# Patient Record
Sex: Female | Born: 1935 | Race: White | Hispanic: No | Marital: Single | Smoking: Never smoker
Health system: Southern US, Community
[De-identification: ages and names within clinical notes are randomized; demographics above are authoritative.]

## PROBLEM LIST (undated history)

## (undated) DIAGNOSIS — M1990 Unspecified osteoarthritis, unspecified site: Secondary | ICD-10-CM

## (undated) DIAGNOSIS — C761 Malignant neoplasm of thorax: Secondary | ICD-10-CM

## (undated) DIAGNOSIS — C44300 Unspecified malignant neoplasm of skin of unspecified part of face: Secondary | ICD-10-CM

## (undated) DIAGNOSIS — R7303 Prediabetes: Secondary | ICD-10-CM

## (undated) DIAGNOSIS — Z923 Personal history of irradiation: Secondary | ICD-10-CM

## (undated) DIAGNOSIS — F329 Major depressive disorder, single episode, unspecified: Secondary | ICD-10-CM

## (undated) DIAGNOSIS — M4800 Spinal stenosis, site unspecified: Secondary | ICD-10-CM

## (undated) DIAGNOSIS — K2100 Gastro-esophageal reflux disease with esophagitis, without bleeding: Secondary | ICD-10-CM

## (undated) DIAGNOSIS — N200 Calculus of kidney: Secondary | ICD-10-CM

## (undated) DIAGNOSIS — F32A Depression, unspecified: Secondary | ICD-10-CM

## (undated) DIAGNOSIS — K219 Gastro-esophageal reflux disease without esophagitis: Secondary | ICD-10-CM

## (undated) DIAGNOSIS — K8020 Calculus of gallbladder without cholecystitis without obstruction: Secondary | ICD-10-CM

## (undated) DIAGNOSIS — I10 Essential (primary) hypertension: Secondary | ICD-10-CM

## (undated) DIAGNOSIS — I2510 Atherosclerotic heart disease of native coronary artery without angina pectoris: Secondary | ICD-10-CM

## (undated) DIAGNOSIS — F419 Anxiety disorder, unspecified: Secondary | ICD-10-CM

## (undated) DIAGNOSIS — I493 Ventricular premature depolarization: Secondary | ICD-10-CM

## (undated) DIAGNOSIS — J449 Chronic obstructive pulmonary disease, unspecified: Secondary | ICD-10-CM

## (undated) DIAGNOSIS — IMO0002 Reserved for concepts with insufficient information to code with codable children: Secondary | ICD-10-CM

## (undated) DIAGNOSIS — C50919 Malignant neoplasm of unspecified site of unspecified female breast: Secondary | ICD-10-CM

## (undated) DIAGNOSIS — I480 Paroxysmal atrial fibrillation: Secondary | ICD-10-CM

## (undated) DIAGNOSIS — H269 Unspecified cataract: Secondary | ICD-10-CM

## (undated) DIAGNOSIS — E785 Hyperlipidemia, unspecified: Secondary | ICD-10-CM

## (undated) DIAGNOSIS — C7989 Secondary malignant neoplasm of other specified sites: Secondary | ICD-10-CM

## (undated) DIAGNOSIS — I340 Nonrheumatic mitral (valve) insufficiency: Secondary | ICD-10-CM

## (undated) HISTORY — PX: APPENDECTOMY: SHX54

## (undated) HISTORY — PX: CARDIAC CATHETERIZATION: SHX172

## (undated) HISTORY — PX: VEIN LIGATION AND STRIPPING: SHX2653

## (undated) HISTORY — DX: Unspecified cataract: H26.9

## (undated) HISTORY — DX: Ventricular premature depolarization: I49.3

## (undated) HISTORY — DX: Chronic obstructive pulmonary disease, unspecified: J44.9

## (undated) HISTORY — DX: Spinal stenosis, site unspecified: M48.00

## (undated) HISTORY — DX: Calculus of gallbladder without cholecystitis without obstruction: K80.20

## (undated) HISTORY — DX: Malignant neoplasm of unspecified site of unspecified female breast: C50.919

## (undated) HISTORY — DX: Prediabetes: R73.03

## (undated) HISTORY — PX: VESICOVAGINAL FISTULA CLOSURE W/ TAH: SUR271

## (undated) HISTORY — DX: Hyperlipidemia, unspecified: E78.5

## (undated) HISTORY — DX: Gastro-esophageal reflux disease with esophagitis: K21.0

## (undated) HISTORY — DX: Reserved for concepts with insufficient information to code with codable children: IMO0002

## (undated) HISTORY — DX: Malignant neoplasm of thorax: C76.1

## (undated) HISTORY — DX: Gastro-esophageal reflux disease with esophagitis, without bleeding: K21.00

## (undated) HISTORY — PX: ABDOMINAL HYSTERECTOMY: SHX81

## (undated) HISTORY — PX: TONSILLECTOMY: SUR1361

## (undated) HISTORY — DX: Paroxysmal atrial fibrillation: I48.0

## (undated) HISTORY — DX: Calculus of kidney: N20.0

## (undated) HISTORY — DX: Gastro-esophageal reflux disease without esophagitis: K21.9

## (undated) HISTORY — DX: Secondary malignant neoplasm of other specified sites: C79.89

## (undated) HISTORY — DX: Unspecified malignant neoplasm of skin of unspecified part of face: C44.300

## (undated) HISTORY — DX: Unspecified osteoarthritis, unspecified site: M19.90

## (undated) HISTORY — DX: Personal history of irradiation: Z92.3

---

## 1959-09-28 HISTORY — PX: TUBAL LIGATION: SHX77

## 1996-09-27 HISTORY — PX: SMALL INTESTINE SURGERY: SHX150

## 1998-04-02 ENCOUNTER — Ambulatory Visit (HOSPITAL_COMMUNITY): Admission: RE | Admit: 1998-04-02 | Discharge: 1998-04-02 | Payer: Self-pay

## 1998-04-18 ENCOUNTER — Ambulatory Visit (HOSPITAL_COMMUNITY): Admission: RE | Admit: 1998-04-18 | Discharge: 1998-04-18 | Payer: Self-pay | Admitting: *Deleted

## 1999-04-24 ENCOUNTER — Ambulatory Visit (HOSPITAL_COMMUNITY): Admission: RE | Admit: 1999-04-24 | Discharge: 1999-04-24 | Payer: Self-pay

## 1999-05-07 ENCOUNTER — Encounter (INDEPENDENT_AMBULATORY_CARE_PROVIDER_SITE_OTHER): Payer: Self-pay | Admitting: Specialist

## 1999-05-07 ENCOUNTER — Ambulatory Visit (HOSPITAL_COMMUNITY): Admission: RE | Admit: 1999-05-07 | Discharge: 1999-05-07 | Payer: Self-pay | Admitting: *Deleted

## 1999-05-22 ENCOUNTER — Ambulatory Visit (HOSPITAL_COMMUNITY): Admission: RE | Admit: 1999-05-22 | Discharge: 1999-05-22 | Payer: Self-pay | Admitting: *Deleted

## 1999-06-16 ENCOUNTER — Encounter: Admission: RE | Admit: 1999-06-16 | Discharge: 1999-09-14 | Payer: Self-pay | Admitting: Radiation Oncology

## 1999-12-15 ENCOUNTER — Encounter: Payer: Self-pay | Admitting: Internal Medicine

## 1999-12-15 ENCOUNTER — Ambulatory Visit (HOSPITAL_COMMUNITY): Admission: RE | Admit: 1999-12-15 | Discharge: 1999-12-15 | Payer: Self-pay | Admitting: Internal Medicine

## 2000-03-11 ENCOUNTER — Encounter: Admission: RE | Admit: 2000-03-11 | Discharge: 2000-03-11 | Payer: Self-pay | Admitting: Internal Medicine

## 2000-03-11 ENCOUNTER — Encounter: Payer: Self-pay | Admitting: Internal Medicine

## 2000-03-22 ENCOUNTER — Encounter: Admission: RE | Admit: 2000-03-22 | Discharge: 2000-06-20 | Payer: Self-pay | Admitting: Internal Medicine

## 2000-06-09 ENCOUNTER — Encounter: Admission: RE | Admit: 2000-06-09 | Discharge: 2000-06-09 | Payer: Self-pay | Admitting: Allergy and Immunology

## 2000-06-09 ENCOUNTER — Encounter: Payer: Self-pay | Admitting: Allergy and Immunology

## 2000-07-13 ENCOUNTER — Encounter: Admission: RE | Admit: 2000-07-13 | Discharge: 2000-07-13 | Payer: Self-pay | Admitting: Internal Medicine

## 2000-07-13 ENCOUNTER — Encounter: Payer: Self-pay | Admitting: Internal Medicine

## 2000-09-27 HISTORY — PX: BREAST LUMPECTOMY: SHX2

## 2001-03-16 ENCOUNTER — Encounter: Payer: Self-pay | Admitting: Gastroenterology

## 2001-03-16 ENCOUNTER — Encounter: Admission: RE | Admit: 2001-03-16 | Discharge: 2001-03-16 | Payer: Self-pay | Admitting: Gastroenterology

## 2001-07-24 ENCOUNTER — Encounter: Payer: Self-pay | Admitting: Internal Medicine

## 2001-07-24 ENCOUNTER — Encounter: Admission: RE | Admit: 2001-07-24 | Discharge: 2001-07-24 | Payer: Self-pay | Admitting: Internal Medicine

## 2002-04-03 ENCOUNTER — Encounter: Admission: RE | Admit: 2002-04-03 | Discharge: 2002-04-03 | Payer: Self-pay | Admitting: Internal Medicine

## 2002-04-03 ENCOUNTER — Encounter: Payer: Self-pay | Admitting: Internal Medicine

## 2002-05-07 ENCOUNTER — Encounter: Payer: Self-pay | Admitting: Internal Medicine

## 2002-05-07 ENCOUNTER — Encounter: Admission: RE | Admit: 2002-05-07 | Discharge: 2002-05-07 | Payer: Self-pay | Admitting: Internal Medicine

## 2002-05-15 ENCOUNTER — Encounter: Admission: RE | Admit: 2002-05-15 | Discharge: 2002-08-13 | Payer: Self-pay | Admitting: Internal Medicine

## 2002-10-17 ENCOUNTER — Encounter: Payer: Self-pay | Admitting: Oncology

## 2002-10-17 ENCOUNTER — Encounter: Admission: RE | Admit: 2002-10-17 | Discharge: 2002-10-17 | Payer: Self-pay | Admitting: Oncology

## 2003-01-19 ENCOUNTER — Emergency Department (HOSPITAL_COMMUNITY): Admission: EM | Admit: 2003-01-19 | Discharge: 2003-01-19 | Payer: Self-pay | Admitting: *Deleted

## 2003-01-19 ENCOUNTER — Encounter: Payer: Self-pay | Admitting: Emergency Medicine

## 2003-09-10 ENCOUNTER — Ambulatory Visit (HOSPITAL_COMMUNITY): Admission: RE | Admit: 2003-09-10 | Discharge: 2003-09-10 | Payer: Self-pay | Admitting: Gastroenterology

## 2003-10-23 ENCOUNTER — Encounter: Admission: RE | Admit: 2003-10-23 | Discharge: 2003-10-23 | Payer: Self-pay | Admitting: Oncology

## 2004-10-27 ENCOUNTER — Ambulatory Visit: Payer: Self-pay | Admitting: Oncology

## 2004-12-24 ENCOUNTER — Encounter: Admission: RE | Admit: 2004-12-24 | Discharge: 2004-12-24 | Payer: Self-pay | Admitting: Oncology

## 2004-12-29 ENCOUNTER — Ambulatory Visit: Payer: Self-pay | Admitting: Oncology

## 2006-09-14 ENCOUNTER — Emergency Department (HOSPITAL_COMMUNITY): Admission: EM | Admit: 2006-09-14 | Discharge: 2006-09-14 | Payer: Self-pay | Admitting: Emergency Medicine

## 2006-11-09 ENCOUNTER — Encounter: Admission: RE | Admit: 2006-11-09 | Discharge: 2006-11-09 | Payer: Self-pay | Admitting: *Deleted

## 2007-11-16 ENCOUNTER — Ambulatory Visit (HOSPITAL_COMMUNITY): Admission: RE | Admit: 2007-11-16 | Discharge: 2007-11-16 | Payer: Self-pay | Admitting: *Deleted

## 2008-01-04 ENCOUNTER — Encounter: Admission: RE | Admit: 2008-01-04 | Discharge: 2008-01-04 | Payer: Self-pay | Admitting: *Deleted

## 2008-02-19 ENCOUNTER — Emergency Department (HOSPITAL_COMMUNITY): Admission: EM | Admit: 2008-02-19 | Discharge: 2008-02-20 | Payer: Self-pay | Admitting: Emergency Medicine

## 2008-08-15 ENCOUNTER — Observation Stay (HOSPITAL_COMMUNITY): Admission: EM | Admit: 2008-08-15 | Discharge: 2008-08-15 | Payer: Self-pay | Admitting: Emergency Medicine

## 2008-11-25 ENCOUNTER — Ambulatory Visit (HOSPITAL_COMMUNITY): Admission: RE | Admit: 2008-11-25 | Discharge: 2008-11-25 | Payer: Self-pay | Admitting: Internal Medicine

## 2009-01-06 ENCOUNTER — Ambulatory Visit (HOSPITAL_COMMUNITY): Admission: RE | Admit: 2009-01-06 | Discharge: 2009-01-06 | Payer: Self-pay | Admitting: Internal Medicine

## 2009-03-26 ENCOUNTER — Ambulatory Visit (HOSPITAL_COMMUNITY): Admission: RE | Admit: 2009-03-26 | Discharge: 2009-03-26 | Payer: Self-pay | Admitting: Internal Medicine

## 2009-07-25 ENCOUNTER — Ambulatory Visit (HOSPITAL_COMMUNITY): Admission: RE | Admit: 2009-07-25 | Discharge: 2009-07-25 | Payer: Self-pay | Admitting: Internal Medicine

## 2010-03-10 ENCOUNTER — Ambulatory Visit (HOSPITAL_COMMUNITY): Admission: RE | Admit: 2010-03-10 | Discharge: 2010-03-10 | Payer: Self-pay | Admitting: Internal Medicine

## 2010-10-18 ENCOUNTER — Encounter: Payer: Self-pay | Admitting: Internal Medicine

## 2011-01-30 ENCOUNTER — Emergency Department (INDEPENDENT_AMBULATORY_CARE_PROVIDER_SITE_OTHER): Payer: Medicare Other

## 2011-01-30 ENCOUNTER — Emergency Department (HOSPITAL_BASED_OUTPATIENT_CLINIC_OR_DEPARTMENT_OTHER)
Admission: EM | Admit: 2011-01-30 | Discharge: 2011-01-31 | Disposition: A | Discharge status: Home / Self Care | Payer: Medicare Other | Attending: Emergency Medicine | Admitting: Emergency Medicine

## 2011-01-30 DIAGNOSIS — R0602 Shortness of breath: Secondary | ICD-10-CM

## 2011-01-30 DIAGNOSIS — R911 Solitary pulmonary nodule: Secondary | ICD-10-CM

## 2011-01-30 DIAGNOSIS — J45909 Unspecified asthma, uncomplicated: Secondary | ICD-10-CM | POA: Insufficient documentation

## 2011-01-30 DIAGNOSIS — G8929 Other chronic pain: Secondary | ICD-10-CM | POA: Insufficient documentation

## 2011-01-30 DIAGNOSIS — Z79899 Other long term (current) drug therapy: Secondary | ICD-10-CM | POA: Insufficient documentation

## 2011-01-30 DIAGNOSIS — J40 Bronchitis, not specified as acute or chronic: Secondary | ICD-10-CM | POA: Insufficient documentation

## 2011-01-30 DIAGNOSIS — R079 Chest pain, unspecified: Secondary | ICD-10-CM

## 2011-01-30 LAB — TROPONIN I: Troponin I: <0.3 ng/mL (ref <0.30)

## 2011-01-30 LAB — DIFFERENTIAL
Basophils Absolute: 0 10*3/uL (ref 0.0–0.1)
Basophils Relative: 0 % (ref 0–1)
Eosinophils Relative: 0 % (ref 0–5)
Monocytes Relative: 10 % (ref 3–12)

## 2011-01-30 LAB — BASIC METABOLIC PANEL
BUN: 13 mg/dL (ref 6–23)
CO2: 27 mEq/L (ref 19–32)
Creatinine, Ser: 0.7 mg/dL (ref 0.4–1.2)
GFR calc Af Amer: >60 mL/min (ref >60)
Potassium: 4.8 mEq/L (ref 3.5–5.1)

## 2011-01-30 LAB — CBC
MCH: 30 pg (ref 26.0–34.0)
RDW: 13.3 % (ref 11.5–15.5)
WBC: 8 10*3/uL (ref 4.0–10.5)

## 2011-01-30 LAB — D-DIMER, QUANTITATIVE: D-Dimer, Quant: <0.22 ug/mL-FEU (ref 0.00–0.48)

## 2011-01-30 LAB — CK TOTAL AND CKMB (NOT AT ARMC)
CK, MB: 2.3 ng/mL (ref 0.3–4.0)
Relative Index: INVALID (ref 0.0–2.5)
Total CK: 48 U/L (ref 7–177)

## 2011-01-31 MED ORDER — IOHEXOL 300 MG/ML  SOLN: 80.0000 mL | Freq: Once | INTRAMUSCULAR | Status: AC | PRN

## 2011-01-31 MED ORDER — IOHEXOL 300 MG/ML  SOLN
80.0000 mL | Freq: Once | INTRAMUSCULAR | Status: AC | PRN
  Administered 2011-01-30 – 2011-01-31 (×2): 80 mL via INTRAVENOUS

## 2011-02-08 ENCOUNTER — Emergency Department (HOSPITAL_BASED_OUTPATIENT_CLINIC_OR_DEPARTMENT_OTHER)
Admission: EM | Admit: 2011-02-08 | Discharge: 2011-02-08 | Disposition: A | Discharge status: Home / Self Care | Payer: Medicare Other | Attending: Emergency Medicine | Admitting: Emergency Medicine

## 2011-02-08 ENCOUNTER — Emergency Department (INDEPENDENT_AMBULATORY_CARE_PROVIDER_SITE_OTHER): Payer: Medicare Other

## 2011-02-08 DIAGNOSIS — K219 Gastro-esophageal reflux disease without esophagitis: Secondary | ICD-10-CM | POA: Insufficient documentation

## 2011-02-08 DIAGNOSIS — R51 Headache: Secondary | ICD-10-CM

## 2011-02-08 DIAGNOSIS — J4489 Other specified chronic obstructive pulmonary disease: Secondary | ICD-10-CM | POA: Insufficient documentation

## 2011-02-08 DIAGNOSIS — M549 Dorsalgia, unspecified: Secondary | ICD-10-CM

## 2011-02-08 DIAGNOSIS — M545 Low back pain, unspecified: Secondary | ICD-10-CM | POA: Insufficient documentation

## 2011-02-08 DIAGNOSIS — J449 Chronic obstructive pulmonary disease, unspecified: Secondary | ICD-10-CM | POA: Insufficient documentation

## 2011-02-08 DIAGNOSIS — J984 Other disorders of lung: Secondary | ICD-10-CM

## 2011-02-08 DIAGNOSIS — G8929 Other chronic pain: Secondary | ICD-10-CM | POA: Insufficient documentation

## 2011-02-08 DIAGNOSIS — Z79899 Other long term (current) drug therapy: Secondary | ICD-10-CM | POA: Insufficient documentation

## 2011-02-08 LAB — URINALYSIS, ROUTINE W REFLEX MICROSCOPIC
Glucose, UA: NEGATIVE mg/dL
Hgb urine dipstick: NEGATIVE
Ketones, ur: NEGATIVE mg/dL
Specific Gravity, Urine: 1.004 — ABNORMAL LOW (ref 1.005–1.030)
pH: 6 (ref 5.0–8.0)

## 2011-02-09 NOTE — Discharge Summary (Signed)
NAMECLAYTON, JARMON NO.:  [id number]   MEDICAL RECORD NO.:  [id number]          PATIENT TYPE:  INP   LOCATION:  5125                         FACILITY:  [facility]   PHYSICIAN:  Heloise Purpura, MD      DATE OF BIRTH:  Aug 15, 1932   DATE OF ADMISSION:  08/14/2008  DATE OF DISCHARGE:  08/15/2008                               DISCHARGE SUMMARY   ADMISSION DIAGNOSIS:  Right ureteral calculus.   DISCHARGE DIAGNOSIS:  Right ureteral calculus.   HISTORY AND PHYSICAL:  For full details please see admission history and  physical.  Briefly, Ms. Vorhees is a 77 year old female who was  admitted after presenting to the emergency department with severe right-  sided flank pain.  She was found to have an 8 mm distal right ureteral  calculus.  She was admitted for IV pain control and IV fluid hydration.   HOSPITAL COURSE:  She was initially placed on tamsulosin with her pain  controlled on IV pain medication.  Her urine was strained and she  subsequently passed her calculus on August 15, 2008.  Her pain was  much improved and she was able to be discharged home in good condition.   DISPOSITION:  Home.   DISCHARGE MEDICATIONS:  She was instructed to resume her regular home  medications.   FOLLOWUP:  She will follow up with me in the next 6-8 weeks for further  evaluation.  She does have bilateral renal calculi and will need to  undergo a metabolic evaluation for further stone prevention.      Heloise Purpura, MD  Electronically Signed     LB/MEDQ  D:  08/15/2008  T:  08/15/2008  Job:  [id number]

## 2011-02-11 ENCOUNTER — Ambulatory Visit (INDEPENDENT_AMBULATORY_CARE_PROVIDER_SITE_OTHER): Payer: Medicare Other | Admitting: Critical Care Medicine

## 2011-02-11 ENCOUNTER — Encounter: Payer: Self-pay | Admitting: Critical Care Medicine

## 2011-02-11 DIAGNOSIS — J45909 Unspecified asthma, uncomplicated: Secondary | ICD-10-CM

## 2011-02-11 DIAGNOSIS — M810 Age-related osteoporosis without current pathological fracture: Secondary | ICD-10-CM

## 2011-02-11 DIAGNOSIS — E785 Hyperlipidemia, unspecified: Secondary | ICD-10-CM

## 2011-02-11 DIAGNOSIS — G8929 Other chronic pain: Secondary | ICD-10-CM

## 2011-02-11 DIAGNOSIS — C50919 Malignant neoplasm of unspecified site of unspecified female breast: Secondary | ICD-10-CM | POA: Insufficient documentation

## 2011-02-11 DIAGNOSIS — M1990 Unspecified osteoarthritis, unspecified site: Secondary | ICD-10-CM | POA: Insufficient documentation

## 2011-02-11 MED ORDER — BECLOMETHASONE DIPROPIONATE 80 MCG/ACT IN AERS: 2.0000 | INHALATION_SPRAY | Freq: Two times a day (BID) | RESPIRATORY_TRACT | Status: DC

## 2011-02-11 NOTE — Progress Notes (Signed)
Subjective:     Patient ID: Angel [name], female   DOB: [birth year], 77 y.o.   MRN: [id number]  Shortness of Breath This is a new (ED visit 01/31/11  dx copd and dx allergy.  rx BD nebs.  Returned again 5/12) problem. The current episode started more than 1 month ago. The problem occurs daily (exertional only.  Not at rest Not able to do much ADLs). The problem has been unchanged. Duration: 2-3 hours to recover, Associated symptoms include chest pain, ear pain, headaches, leg swelling, orthopnea, PND, rhinorrhea, a sore throat and wheezing. Pertinent negatives include no abdominal pain, fever, neck pain, rash, sputum production, syncope or vomiting. The symptoms are aggravated by any activity, fumes, odors, smoke, weather changes, URIs and emotional upset. Associated symptoms comments: Severe fatigue.  Notes diaphoresis.  Notes dizziness Chest pain is tightness.  Hard time taking a deep breath.  Pain across back on shoulders. Tranxene is only relief No real mucus no real cough . She has tried beta agonist inhalers for the symptoms. The treatment provided no relief. Her past medical history is significant for allergies and asthma. There is no history of chronic lung disease or COPD. (Sees Eric Kozlow allergies: dust, mold)  Cough This is a chronic problem. The current episode started more than 1 month ago. Episode frequency: rare  The cough is non-productive. Associated symptoms include chest pain, chills, ear pain, headaches, myalgias, nasal congestion, postnasal drip, rhinorrhea, a sore throat, shortness of breath and wheezing. Pertinent negatives include no fever or rash. Her past medical history is significant for asthma. There is no history of COPD. Sees Eric Kozlow allergies: dust, mold   77 y.o.WF DX asthma entire life.  Current episode started two months ago.  Dyspnea,  Chest pain,  Headaches and shoulder ache, fatigue. Confusion.   Has been to ED several times Rx zpak, pred, solumedrol   Usually helps . Symptoms as above. Review of Systems  Constitutional: Positive for chills, diaphoresis, activity change, appetite change and fatigue. Negative for fever and unexpected weight change.  HENT: Positive for ear pain, congestion, sore throat, rhinorrhea, sneezing, dental problem, voice change, postnasal drip and sinus pressure. Negative for hearing loss, nosebleeds, facial swelling, mouth sores, trouble swallowing, neck pain, neck stiffness, tinnitus and ear discharge.   Eyes: Positive for visual disturbance. Negative for photophobia, discharge and itching.  Respiratory: Positive for cough, chest tightness, shortness of breath and wheezing. Negative for apnea, sputum production, choking and stridor.   Cardiovascular: Positive for chest pain, palpitations, orthopnea, leg swelling and PND. Negative for syncope.  Gastrointestinal: Positive for nausea. Negative for vomiting, abdominal pain, constipation, blood in stool and abdominal distention.  Genitourinary: Positive for urgency and frequency. Negative for dysuria, hematuria, flank pain, decreased urine volume and difficulty urinating.  Musculoskeletal: Positive for myalgias, back pain and gait problem. Negative for joint swelling and arthralgias.  Skin: Negative for color change, pallor and rash.  Neurological: Positive for dizziness, tremors, weakness, light-headedness and headaches. Negative for seizures, syncope, speech difficulty and numbness.  Hematological: Negative for adenopathy. Does not bruise/bleed easily.  Psychiatric/Behavioral: Positive for confusion, sleep disturbance and agitation. The patient is nervous/anxious.        Objective:   Physical Exam Filed Vitals:   02/11/11 1120  BP: 120/70  Pulse: 63  Temp: 98.1 F (36.7 C)  TempSrc: Oral  Height: 5' 1.5" (1.562 m)  Weight: 155 lb 8 oz (70.534 kg)  SpO2: 99%    Gen: Pleasant, well-nourished, in no  distress,  normal affect  ENT: No lesions,  mouth clear,   oropharynx clear, no postnasal drip  Neck: No JVD, no TMG, no carotid bruits  Lungs: No use of accessory muscles, no dullness to percussion, distant BS  Cardiovascular: RRR, heart sounds normal, no murmur or gallops, no peripheral edema  Abdomen: soft and NT, no HSM,  BS normal  Musculoskeletal: No deformities, no cyanosis or clubbing  Neuro: alert, non focal  Skin: Warm, no lesions or rashes  CT Chest: 01/31/11 Mild peripheral scar in RUL No LAN     Assessment:     Asthma Moderate persistent asthma with upper airway irritation from advair Plan Stop Advair Start Qvar two puff twice daily Stop vibramycin No more steroids by mouth No more antibiotics You have ASTHMA,  NO COPD A visiting RN and PT will be ordered from [facility] diet Stay on Nexium daily Return 1 month at [facility], needs spirometry first     Updated Medication List Outpatient Encounter Prescriptions as of 02/11/2011  Medication Sig Dispense Refill  . Calcium-Magnesium-Zinc 1000-400-15 MG TABS Take 1 tablet by mouth daily.        . citalopram (CELEXA) 40 MG tablet 1.5 tablets once daily       . clorazepate (TRANXENE) 7.5 MG tablet Take 7.5 mg by mouth 2 (two) times daily.        [name] esomeprazole (NEXIUM) 40 MG capsule Take 40 mg by mouth daily before breakfast.        . EVISTA 60 MG tablet Once daily      . HYDROcodone-acetaminophen (NORCO) 10-325 MG per tablet 1 every 4-6 hours as needed      . Multiple Vitamin (MULTIVITAMIN) tablet Take 1 tablet by mouth daily.        [name] NASONEX 50 MCG/ACT nasal spray 2 sprays by Nasal route Daily.      [name] oxyCODONE-acetaminophen (PERCOCET) 5-325 MG per tablet 1-2 every 4-6 hours as needed      . PROAIR HFA 108 (90 BASE) MCG/ACT inhaler 1 puff every 4-6 hours as needed      . SINGULAIR 10 MG tablet Once tablet at bedtime      . ZETIA 10 MG tablet Once at bedtime      . DISCONTD: CELEBREX 200 MG capsule Once daily      . DISCONTD: doxycycline (VIBRA-TABS) 100 MG  tablet Two times daily      . DISCONTD: Fluticasone-Salmeterol (ADVAIR DISKUS) 250-50 MCG/DOSE AEPB Inhale 1 puff into the lungs every 12 (twelve) hours.        [name] DISCONTD: guaifenesin (HUMIBID E) 400 MG TABS Take 400 mg by mouth 2 (two) times daily.        . beclomethasone (QVAR) 80 MCG/ACT inhaler Inhale 2 puffs into the lungs 2 (two) times daily.  1 Inhaler  6         Plan:

## 2011-02-11 NOTE — Patient Instructions (Signed)
Stop Advair Start Qvar two puff twice daily Stop vibramycin No more steroids by mouth No more antibiotics You have ASTHMA,  NO COPD A visiting RN and PT will be ordered from [facility] diet Stay on Nexium daily Return 1 month at [facility], needs spirometry first

## 2011-02-11 NOTE — Progress Notes (Deleted)
  Subjective:    Patient ID: Angel [name], female    DOB: 12-28-1931, 77 y.o.   MRN: [id number]  HPI    Review of Systems     Objective:   Physical Exam        Assessment & Plan:

## 2011-02-12 NOTE — H&P (Signed)
Angel [name], Angel [name] NO.:  [id number]   MEDICAL RECORD NO.:  [id number]          PATIENT TYPE:  INP   LOCATION:  5125                         FACILITY:  [facility]   PHYSICIAN:  Heloise Purpura, MD      DATE OF BIRTH:  [birth year]   DATE OF ADMISSION:  08/14/2008  DATE OF DISCHARGE:                              HISTORY & PHYSICAL   CHIEF COMPLAINT:  Kidney stone.   HISTORY:  Angel [name] is a 77 year old female who began having the  acute onset of sudden right flank pain earlier today.  Her pain radiated  to her right lower quadrant and was associated with nausea and vomiting.  She denies any fever.  She does not have a known history of prior kidney  stone episodes, although has been told that she did have nonobstructing  renal calculi in the past on imaging studies.  She was evaluated in the  emergency department and underwent a CT scan, which demonstrated an 8-mm  right UVJ calculus with associated hydronephrosis.  Her pain was unable  to be controlled with IV pain medications, and therefore a urologic  consultation was obtained.   PAST MEDICAL HISTORY:  1. Asthma with a recent acute bronchitis.  2. Degenerative joint disease.  3. Gastroesophageal reflux disease.  4. Spinal stenosis.  5. Depression.   MEDICATIONS:  1. Prednisone taper.  2. Azithromycin.  3. Fluoxetine.  4. Albuterol.  5. Vitamin D.  6. Nasonex.  7. Citalopram.  8. Nexium.  9. Vicodin p.r.n.  10.Naproxen.   ALLERGIES:  PENICILLIN.   FAMILY HISTORY:  No GU malignancy.   SOCIAL HISTORY:  The patient denies tobacco or alcohol abuse.   REVIEW OF SYSTEMS:  A complete review of systems was performed.  All  systems are reviewed and are negative except for the history of present  illness and her recent history of cough and shortness of breath related  to bronchitis.   PHYSICAL EXAMINATION:  VITALS:  Temperature 98.1, heart rate 85, blood  pressure 184/96, and respirations 20.  CONSTITUTIONAL:  Well-nourished, well-developed, age-appropriate female  in no acute distress.  HEENT:  Normocephalic, atraumatic.  NECK:  Supple without lymphadenopathy.  CARDIOVASCULAR:  Regular rate and rhythm without obvious murmurs.  LUNGS:  Clear bilaterally.  ABDOMEN:  Soft and nondistended.  She does have moderate tenderness over  her right lower quadrant without rebound, tenderness, or guarding.  BACK:  Moderate right CVA tenderness.  EXTREMITIES:  No edema.  NEUROLOGIC:  Grossly intact.   LABORATORY DATA:  Serum creatinine 0.77.  White blood count 11.7.  Urinalysis is dipstick negative.   RADIOLOGIC IMAGING:  The patient's CT scan was independently reviewed  and demonstrates multiple bilateral nonobstructing renal calculi.  There  is an 8-mm right UVJ calculus with hydronephrosis and perinephric fluid  consistent with probable forniceal extravasation of urine.   IMPRESSION:  The right ureterovesical junction calculus.   PLAN:  She will be admitted for IV pain control and IV fluid hydration.  She will be given an initial observation.  To spontaneously pass her  stone and will begin Flomax 0.4 mg daily.  Heloise Purpura, MD  Electronically Signed     LB/MEDQ  D:  08/16/2008  T:  08/16/2008  Job:  [id number]

## 2011-02-13 NOTE — Assessment & Plan Note (Signed)
Moderate persistent asthma with upper airway irritation from advair Plan Stop Advair Start Qvar two puff twice daily Stop vibramycin No more steroids by mouth No more antibiotics You have ASTHMA,  NO COPD A visiting RN and PT will be ordered from [facility] diet Stay on Nexium daily Return 1 month at [facility], needs spirometry first

## 2011-02-24 ENCOUNTER — Encounter: Payer: Self-pay | Admitting: Critical Care Medicine

## 2011-03-16 ENCOUNTER — Ambulatory Visit (INDEPENDENT_AMBULATORY_CARE_PROVIDER_SITE_OTHER): Payer: Medicare Other | Admitting: Critical Care Medicine

## 2011-03-16 ENCOUNTER — Other Ambulatory Visit: Payer: Medicare Other

## 2011-03-16 ENCOUNTER — Encounter: Payer: Self-pay | Admitting: Critical Care Medicine

## 2011-03-16 DIAGNOSIS — J45909 Unspecified asthma, uncomplicated: Secondary | ICD-10-CM

## 2011-03-16 LAB — PULMONARY FUNCTION TEST

## 2011-03-16 NOTE — Progress Notes (Signed)
Subjective:     Patient ID: Angel [name], female   DOB: [birth year], 77 y.o.   MRN: [id number]  Shortness of Breath This is a new (ED visit 01/31/11  dx copd and dx allergy.  rx BD nebs.  Returned again 5/12) problem. The current episode started more than 1 month ago. Episode frequency: exertional only.  Not at rest Not able to do much ADLs. The problem has been gradually improving. Duration: 2-3 hours to recover, Pertinent negatives include no abdominal pain, chest pain, ear pain, fever, headaches, leg swelling, neck pain, orthopnea, PND, rash, rhinorrhea, sore throat, sputum production, syncope, vomiting or wheezing. The symptoms are aggravated by any activity, fumes, odors, smoke, weather changes, URIs and emotional upset. Associated symptoms comments: Severe fatigue.  Notes diaphoresis.  Notes dizziness Chest pain is tightness.  Hard time taking a deep breath.  Pain across back on shoulders. Tranxene is only relief No real mucus no real cough . She has tried beta agonist inhalers for the symptoms. The treatment provided no relief. Her past medical history is significant for allergies and asthma. There is no history of chronic lung disease or COPD. (Sees Eric Kozlow allergies: dust, mold)  Cough This is a chronic problem. The current episode started more than 1 month ago. The problem has been rapidly improving. Episode frequency: rare  The cough is non-productive. Pertinent negatives include no chest pain, chills, ear pain, fever, headaches, myalgias, nasal congestion, postnasal drip, rash, rhinorrhea, sore throat, shortness of breath or wheezing. Her past medical history is significant for asthma. There is no history of COPD. Sees Eric Kozlow allergies: dust, mold   78 y.o.WF DX asthma entire life.  Current episode started two months ago.  Dyspnea,  Chest pain,  Headaches and shoulder ache, fatigue. Confusion.   Has been to ED several times Rx zpak, pred, solumedrol  Usually helps . Symptoms as  above.  03/20/2011 At last OV. 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  Will ave 2 episodes through the winter.  Last one 4/12 for 6 weeks Ave twice per year.  Worse in the winter time  Gets allergy shots with Kozlow.    More recently dyspnea is ok except with activity will get dyspneic Review of Systems  Constitutional: Positive for diaphoresis, activity change, appetite change and fatigue. Negative for fever, chills and unexpected weight change.  HENT: Positive for congestion, sneezing, dental problem, voice change and sinus pressure. Negative for hearing loss, ear pain, nosebleeds, sore throat, facial swelling, rhinorrhea, mouth sores, trouble swallowing, neck pain, neck stiffness, postnasal drip, tinnitus and ear discharge.   Eyes: Positive for visual disturbance. Negative for photophobia, discharge and itching.  Respiratory: Positive for cough and chest tightness. Negative for apnea, sputum production, choking, shortness of breath, wheezing and stridor.   Cardiovascular: Positive for palpitations. Negative for chest pain, orthopnea, leg swelling, syncope and PND.  Gastrointestinal: Positive for nausea. Negative for vomiting, abdominal pain, constipation, blood in stool and abdominal distention.  Genitourinary: Positive for urgency and frequency. Negative for dysuria, hematuria, flank pain, decreased urine volume and difficulty urinating.  Musculoskeletal: Positive for back pain and gait problem. Negative for myalgias, joint swelling and arthralgias.  Skin: Negative for color change, pallor and rash.  Neurological: Positive for dizziness, tremors, weakness and light-headedness. Negative for seizures, syncope, speech difficulty,  numbness and headaches.  Hematological: Negative for adenopathy. Does not bruise/bleed easily.  Psychiatric/Behavioral: Positive for confusion, sleep disturbance and agitation. The patient is nervous/anxious.        Objective:   Physical Exam  Filed Vitals:   03/16/11 1153  BP: 110/66  Pulse: 44  Temp: 97.9 F (36.6 C)  TempSrc: Oral  Height: 5\' 2"  (1.575 m)  Weight: 154 lb (69.854 kg)  SpO2: 97%    Gen: Pleasant, well-nourished, in no distress,  normal affect  ENT: No lesions,  mouth clear,  oropharynx clear, no postnasal drip  Neck: No JVD, no TMG, no carotid bruits  Lungs: No use of accessory muscles, no dullness to percussion, clear  Cardiovascular: RRR, heart sounds normal, no murmur or gallops, no peripheral edema  Abdomen: soft and NT, no HSM,  BS normal  Musculoskeletal: No deformities, no cyanosis or clubbing  Neuro: alert, non focal  Skin: Warm, no lesions or rashes  CT Chest: 01/31/11 Mild peripheral scar in RUL No LAN 6/19: IgE <1.5    Assessment:     Asthma Moderate persistent asthma with Low IgE levels on todays OV so xolair not an option, pt on immunotherapy per Dr Lucie Leather for years Plan No change in inhaled or maintenance medications. Return in  3 months      Updated Medication List Outpatient Encounter Prescriptions as of 03/16/2011  Medication Sig Dispense Refill  . beclomethasone (QVAR) 80 MCG/ACT inhaler Inhale 2 puffs into the lungs 2 (two) times daily.  1 Inhaler  6  . Calcium-Magnesium-Zinc 1000-400-15 MG TABS Take 1 tablet by mouth daily.        . CELEBREX 200 MG capsule Take 1 capsule by mouth Daily.      . Cholecalciferol (VITAMIN D-3) 5000 UNITS TABS Take by mouth daily.        . citalopram (CELEXA) 40 MG tablet 1.5 tablets once daily       . clorazepate (TRANXENE) 7.5 MG tablet Take 7.5 mg by mouth 2 (two) times daily as needed.       . Coenzyme Q10 (CO Q-10 PO) Take by mouth daily.        [name] esomeprazole (NEXIUM) 40 MG capsule  Take 40 mg by mouth daily before breakfast.        . EVISTA 60 MG tablet Once daily      . Flaxseed, Linseed, (FLAX SEED OIL PO) Take by mouth. 1 tsp daily       . HYDROcodone-acetaminophen (NORCO) 10-325 MG per tablet 1 every 6 hours as needed      . Multiple Vitamin (MULTIVITAMIN) tablet Take 1 tablet by mouth daily.        [name] NASONEX 50 MCG/ACT nasal spray 2 sprays by Nasal route Daily.      [name] PROAIR HFA 108 (90 BASE) MCG/ACT inhaler 1 puff every 4-6 hours as needed      . SINGULAIR 10 MG tablet Once tablet at bedtime      . ZETIA 10 MG tablet Once at bedtime      . DISCONTD: oxyCODONE-acetaminophen (PERCOCET) 5-325 MG per tablet 1-2 every 4-6 hours as needed             Plan:

## 2011-03-16 NOTE — Patient Instructions (Signed)
No change in inhaled medications IgE level today in lab to assess for possible Xolair therapy Return 3 months, call sooner if you worsen

## 2011-03-16 NOTE — Progress Notes (Signed)
PFT done today. 

## 2011-03-17 LAB — IGE: IgE (Immunoglobulin E), Serum: <1.5 IU/mL (ref 0.0–180.0)

## 2011-03-18 NOTE — Progress Notes (Signed)
Quick Note:  Called pt's cell number - she was informed IgE levels are normal. No evidence of severe allergies so Xolair is not an option now for her asthma. She verbalized understanding of this. ______

## 2011-03-19 ENCOUNTER — Telehealth: Payer: Self-pay | Admitting: Critical Care Medicine

## 2011-03-19 NOTE — Telephone Encounter (Signed)
Called and spoke with pt and she stated that she did not fully understand when crystal called her with the results of the lab yesterday.  i reviewed the lab with her again. She is aware that per PW she no longer needs the xolair injections.  Pt is very happy to hear that.  She will call for any other problems

## 2011-03-20 NOTE — Assessment & Plan Note (Addendum)
Moderate persistent asthma with Low IgE levels on todays OV so xolair not an option, pt on immunotherapy per Dr Lucie Leather for years Plan No change in inhaled or maintenance medications. Return in  3 months

## 2011-03-22 ENCOUNTER — Encounter: Payer: Self-pay | Admitting: Critical Care Medicine

## 2011-03-25 NOTE — Progress Notes (Signed)
Normal PFTs Pt aware  

## 2011-03-28 ENCOUNTER — Emergency Department (INDEPENDENT_AMBULATORY_CARE_PROVIDER_SITE_OTHER): Payer: Medicare Other

## 2011-03-28 ENCOUNTER — Emergency Department (HOSPITAL_BASED_OUTPATIENT_CLINIC_OR_DEPARTMENT_OTHER)
Admission: EM | Admit: 2011-03-28 | Discharge: 2011-03-28 | Disposition: A | Discharge status: Home / Self Care | Payer: Medicare Other | Attending: Emergency Medicine | Admitting: Emergency Medicine

## 2011-03-28 DIAGNOSIS — K219 Gastro-esophageal reflux disease without esophagitis: Secondary | ICD-10-CM | POA: Insufficient documentation

## 2011-03-28 DIAGNOSIS — X500XXA Overexertion from strenuous movement or load, initial encounter: Secondary | ICD-10-CM

## 2011-03-28 DIAGNOSIS — J4489 Other specified chronic obstructive pulmonary disease: Secondary | ICD-10-CM | POA: Insufficient documentation

## 2011-03-28 DIAGNOSIS — M25559 Pain in unspecified hip: Secondary | ICD-10-CM

## 2011-03-28 DIAGNOSIS — G8929 Other chronic pain: Secondary | ICD-10-CM | POA: Insufficient documentation

## 2011-03-28 DIAGNOSIS — M25569 Pain in unspecified knee: Secondary | ICD-10-CM | POA: Insufficient documentation

## 2011-03-28 DIAGNOSIS — M1710 Unilateral primary osteoarthritis, unspecified knee: Secondary | ICD-10-CM

## 2011-03-28 DIAGNOSIS — Z79899 Other long term (current) drug therapy: Secondary | ICD-10-CM | POA: Insufficient documentation

## 2011-03-28 DIAGNOSIS — J449 Chronic obstructive pulmonary disease, unspecified: Secondary | ICD-10-CM | POA: Insufficient documentation

## 2011-03-28 DIAGNOSIS — M899 Disorder of bone, unspecified: Secondary | ICD-10-CM

## 2011-03-28 DIAGNOSIS — IMO0002 Reserved for concepts with insufficient information to code with codable children: Secondary | ICD-10-CM | POA: Insufficient documentation

## 2011-03-30 ENCOUNTER — Encounter: Payer: Self-pay | Admitting: Critical Care Medicine

## 2011-04-06 ENCOUNTER — Inpatient Hospital Stay (HOSPITAL_BASED_OUTPATIENT_CLINIC_OR_DEPARTMENT_OTHER)
Admission: RE | Admit: 2011-04-06 | Discharge: 2011-04-06 | Disposition: A | Discharge status: Home / Self Care | Payer: Medicare Other | Source: Ambulatory Visit | Attending: Cardiology | Admitting: Cardiology

## 2011-04-06 DIAGNOSIS — I2510 Atherosclerotic heart disease of native coronary artery without angina pectoris: Secondary | ICD-10-CM | POA: Insufficient documentation

## 2011-04-06 DIAGNOSIS — R0602 Shortness of breath: Secondary | ICD-10-CM | POA: Insufficient documentation

## 2011-04-06 DIAGNOSIS — R079 Chest pain, unspecified: Secondary | ICD-10-CM | POA: Insufficient documentation

## 2011-04-06 DIAGNOSIS — R61 Generalized hyperhidrosis: Secondary | ICD-10-CM | POA: Insufficient documentation

## 2011-04-13 NOTE — Cardiovascular Report (Signed)
NAME:  Angel [name], POINTER NO.:  [id number]  MEDICAL RECORD NO.:  [id number]  LOCATION:                                 FACILITY:  PHYSICIAN:  Armanda Magic, M.D.     DATE OF BIRTH:  [birth year]  DATE OF PROCEDURE: DATE OF DISCHARGE:                           CARDIAC CATHETERIZATION   PROCEDURES: 1. Left heart catheterization. 2. Coronary angiography. 3. Left ventriculography.  OPERATOR:  Armanda Magic, MD  INDICATIONS:  Chest pain.  COMPLICATIONS:  None.  IV ACCESS:  Via right femoral artery 4-French sheath.  IV MEDICATIONS: 1. Versed 2 mg. 2. Fentanyl 25 mcg. 3. Morphine 2 mg.  This is a 77 year old female who presented with complaints of chest pain and shortness of breath.  She underwent nuclear stress test, showing no ischemia, normal LV function, normal t.i.d. with normal lung/heart ratio and no ischemia on EKG.  She continues to have episodes of diaphoresis after doing physical exertion and gets heaviness in her chest, shortness of breath, and has to lie down.  She has to take Tranxene to help her relax after these episodes occur.  She now presents for cardiac catheterization.  The patient was brought to the cardiac catheterization laboratory in the fasting nonsedated state.  Informed consent was obtained.  The patient was connected to the continuous heart rate and pulse oximetry monitoring, intermittent blood pressure monitoring.  The right groin was prepped and draped in sterile fashion.  A 1% Xylocaine was used for local anesthesia.  Using modified Seldinger technique, a 4-French sheath was placed in the right femoral artery.  Under fluoroscopic guidance, a 4-French JL-4 catheter was placed in the left coronary artery.  It could not engage the coronary ostium adequately and was exchanged out over a guidewire for a 4-French JL-5 catheter which successfully engaged the coronary ostium.  Multiple cine films were taken at 30-degree RAO, 40- degree  LAO views.  This catheter was then exchanged out over a guidewire for a 4-French 3D RCA catheter which successfully engaged the right coronary ostium.  Multiple cine films were taken in the 30-degree RAO, 40-degree LAO views.  This catheter was then exchanged out over a guidewire for a 4-French angled pigtail catheter which was placed under fluoroscopic guidance in the left ventricular cavity.  The left ventriculography was performed in the 30-degree RAO view using total of 25 mL of contrast at 12 mL per second.  The catheter was then pulled back across the aortic valve with no significant gradient noted.  At the end of the procedure, all catheters and sheaths were removed.  Manual compression was performed until adequate hemostasis was obtained.  The patient was transferred back to room in stable condition.  RESULTS: 1. The left main coronary artery is widely patent and bifurcates in to     left anterior descending artery and left circumflex artery. 2. The left anterior descending artery is widely patent in its     proximal portion.  It then gives rise to a very large diagonal     branch which is almost a secondary LAD.  The diagonal has an     eccentric 20% lesion in the proximal portion, but is widely patent  throughout the rest of the vessel which bifurcates distally into 2     daughter vessels, both of which are widely patent.  At the takeoff     of the first diagonal, there is a 60% narrowing of the LAD.  The     ongoing LAD that was widely patent traverses the apex with no other     obstructive disease.  The left circumflex traverses the AV groove and gives rise to a small-to- moderate sized first obtuse marginal branch which is widely patent.  It then gives rise to a second large obtuse marginal branch which is widely patent and bifurcates into daughter vessels, both of which are widely patent.  The ongoing circumflex traverses the AV groove and is widely patent.  The  right coronary artery is widely patent throughout its course and distally bifurcates into posterior descending artery and posterior lateral artery, both of which are widely patent.  Left ventriculography shows normal LV function, EF 60%, LVEDP is 17 mmHg, aortic pressure 124/62 mmHg, LV pressure 120/10 mmHg.  ASSESSMENT: 1. One-vessel borderline atherosclerotic coronary artery disease of     the left anterior descending coronary artery at the  bifurcation of     the large diagonal branch.  Nuclear stress test did not show any     areas of ischemia in the left anterior descending coronary artery     territory on nuclear stress testing. 2. Normal left ventricular function. 3. Chest pain.  PLAN:  We will discharge home after IV fluid and bedrest and start aspirin 325 mg daily and will review films with Dr. Katrinka Blazing for further evaluation of the need for possible FloWire to determine the significance of LAD lesion.  She will follow up with my nurse practitioner in 1 week for groin check.     Armanda Magic, M.D.     TT/MEDQ  D:  04/06/2011  T:  04/06/2011  Job:  [id number]  cc:   Lucky Cowboy, M.D.  Electronically Signed by Armanda Magic M.D. on 04/13/2011 07:37:40 PM

## 2011-05-06 ENCOUNTER — Other Ambulatory Visit (HOSPITAL_COMMUNITY): Payer: Self-pay | Admitting: Internal Medicine

## 2011-05-06 DIAGNOSIS — Z1231 Encounter for screening mammogram for malignant neoplasm of breast: Secondary | ICD-10-CM

## 2011-06-09 ENCOUNTER — Ambulatory Visit (HOSPITAL_COMMUNITY)
Admission: RE | Admit: 2011-06-09 | Discharge: 2011-06-09 | Disposition: A | Discharge status: Home / Self Care | Payer: Medicare Other | Source: Ambulatory Visit | Attending: Internal Medicine | Admitting: Internal Medicine

## 2011-06-09 ENCOUNTER — Other Ambulatory Visit (HOSPITAL_COMMUNITY): Payer: Self-pay | Admitting: Internal Medicine

## 2011-06-09 DIAGNOSIS — Z0000 Encounter for general adult medical examination without abnormal findings: Secondary | ICD-10-CM | POA: Insufficient documentation

## 2011-06-09 DIAGNOSIS — Z780 Asymptomatic menopausal state: Secondary | ICD-10-CM | POA: Insufficient documentation

## 2011-06-09 DIAGNOSIS — I10 Essential (primary) hypertension: Secondary | ICD-10-CM | POA: Insufficient documentation

## 2011-06-09 DIAGNOSIS — Z13820 Encounter for screening for osteoporosis: Secondary | ICD-10-CM | POA: Insufficient documentation

## 2011-06-09 DIAGNOSIS — Z1231 Encounter for screening mammogram for malignant neoplasm of breast: Secondary | ICD-10-CM | POA: Insufficient documentation

## 2011-06-09 DIAGNOSIS — J45909 Unspecified asthma, uncomplicated: Secondary | ICD-10-CM | POA: Insufficient documentation

## 2011-06-10 ENCOUNTER — Ambulatory Visit (INDEPENDENT_AMBULATORY_CARE_PROVIDER_SITE_OTHER): Payer: Medicare Other | Admitting: Critical Care Medicine

## 2011-06-10 ENCOUNTER — Encounter: Payer: Self-pay | Admitting: Critical Care Medicine

## 2011-06-10 DIAGNOSIS — J45909 Unspecified asthma, uncomplicated: Secondary | ICD-10-CM

## 2011-06-10 MED ORDER — MONTELUKAST SODIUM 10 MG PO TABS: 10.0000 mg | ORAL_TABLET | Freq: Every day | ORAL | Status: DC

## 2011-06-10 NOTE — Assessment & Plan Note (Signed)
Moderate persistent asthma now improved,   Plan Cont qvar 80 two puff twice daily Cont singulair Rov 4 months

## 2011-06-10 NOTE — Patient Instructions (Signed)
Stay on Qvar daily two puffs twice a day Refill singulair sent , generic is available, take it daily No other medication changes, your lung function is better Return 4 months

## 2011-06-10 NOTE — Progress Notes (Signed)
Subjective:     Patient ID: Angel [name], female   DOB: 09/08/1932, 77 y.o.   MRN: [id number]  Shortness of Breath This is a new (ED visit 01/31/11  dx copd and dx allergy.  rx BD nebs.  Returned again 5/12) problem. The current episode started more than 1 month ago. Episode frequency: exertional only.  Not at rest Not able to do much ADLs. The problem has been gradually improving. Duration: 2-3 hours to recover, Pertinent negatives include no abdominal pain, chest pain, ear pain, fever, headaches, leg swelling, neck pain, orthopnea, PND, rash, rhinorrhea, sore throat, sputum production, syncope, vomiting or wheezing. The symptoms are aggravated by any activity, fumes, odors, smoke, weather changes, URIs and emotional upset. Associated symptoms comments: Severe fatigue.  Notes diaphoresis.  Notes dizziness Chest pain is tightness.  Hard time taking a deep breath.  Pain across back on shoulders. Tranxene is only relief No real mucus no real cough . She has tried beta agonist inhalers for the symptoms. The treatment provided no relief. Her past medical history is significant for allergies and asthma. There is no history of chronic lung disease or COPD. (Sees Eric Kozlow allergies: dust, mold)  Cough This is a chronic problem. The current episode started more than 1 month ago. The problem has been rapidly improving. Episode frequency: rare  The cough is non-productive. Associated symptoms include shortness of breath. Pertinent negatives include no chest pain, chills, ear pain, fever, headaches, myalgias, nasal congestion, postnasal drip, rash, rhinorrhea, sore throat or wheezing. Her past medical history is significant for asthma. There is no history of COPD. Sees Eric Kozlow allergies: dust, mold   77 y.o.WF DX asthma entire life.  Current episode started two months ago.  Dyspnea,  Chest pain,  Headaches and shoulder ache, fatigue. Confusion.   Has been to ED several times Rx zpak, pred, solumedrol   Usually helps . Symptoms as above.  6/19 At last OV. 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  Will ave 2 episodes through the winter.  Last one 4/12 for 6 weeks Ave twice per year.  Worse in the winter time  Gets allergy shots with Kozlow.    More recently dyspnea is ok except with activity will get dyspneic  06/10/2011 Had cath 7/12: 50% coronary lesion not enough to explain symptoms.   Notes now, still dyspneic,Better in summer, worse in winter,  Notes fatigue if moves around. Hard time making beds. SOme chest tightness and dyspnea with ADLs  Asthma History:   Symptoms  >2 days/week         Nighttime Awakenings  0-2/month         Asthma interference with normal activity  Minor limitations         SABA use (not for EIB)  > 2 days/wk--not > 1 x/day         Risk: Exacerbations requiring oral systemic steroids  0-1 / year            Review of Systems  Constitutional: Positive for activity change and fatigue. Negative for fever, chills, diaphoresis, appetite change and unexpected weight change.  HENT: Positive for dental problem and voice change. Negative for hearing loss, ear pain, nosebleeds, congestion, sore throat,  facial swelling, rhinorrhea, sneezing, mouth sores, trouble swallowing, neck pain, neck stiffness, postnasal drip, sinus pressure, tinnitus and ear discharge.   Eyes: Positive for visual disturbance. Negative for photophobia, discharge and itching.  Respiratory: Positive for cough, chest tightness and shortness of breath. Negative for apnea, sputum production, choking, wheezing and stridor.   Cardiovascular: Positive for palpitations. Negative for chest pain, orthopnea, leg swelling, syncope and PND.    Gastrointestinal: Negative for nausea, vomiting, abdominal pain, constipation, blood in stool and abdominal distention.  Genitourinary: Positive for urgency and frequency. Negative for dysuria, hematuria, flank pain, decreased urine volume and difficulty urinating.  Musculoskeletal: Positive for back pain and gait problem. Negative for myalgias, joint swelling and arthralgias.  Skin: Negative for color change, pallor and rash.  Neurological: Positive for tremors and light-headedness. Negative for dizziness, seizures, syncope, speech difficulty, weakness, numbness and headaches.  Hematological: Negative for adenopathy. Does not bruise/bleed easily.  Psychiatric/Behavioral: Positive for sleep disturbance. Negative for confusion and agitation. The patient is nervous/anxious.        Objective:   Physical Exam  Filed Vitals:   06/10/11 1457  BP: 126/70  Pulse: 69  Temp: 97.9 F (36.6 C)  TempSrc: Oral  Height: 5\' 1"  (1.549 m)  Weight: 149 lb (67.586 kg)  SpO2: 97%    Gen: Pleasant, well-nourished, in no distress,  normal affect  ENT: No lesions,  mouth clear,  oropharynx clear, no postnasal drip  Neck: No JVD, no TMG, no carotid bruits  Lungs: No use of accessory muscles, no dullness to percussion, distant BS, no wheeze  Cardiovascular: RRR, heart sounds normal, no murmur or gallops, no peripheral edema  Abdomen: soft and NT, no HSM,  BS normal  Musculoskeletal: No deformities, no cyanosis or clubbing  Neuro: alert, non focal  Skin: Warm, no lesions or rashes  CT Chest: 01/31/11 Mild peripheral scar in RUL No LAN 6/19: IgE <1.5 PFT Conversion 03/22/2011  FVC 2.38  FVC PREDICT 2.45  FVC  % Predicted 97  FEV1 1.81  FEV1 PREDICT 1.69  FEV % Predicted 107  FEV1/FVC 76.1  FEV1/FVC PRE 71  FeF 25-75 1.48  FeF 25-75 % Predicted 74  Residual Volume (ml) 1.53  RV % EXPECT 84  DLCO (ml/mmHg sec) 16.6  DLCO % EXPEC 92  DLCO/VA 4.44  DLCO/VA%EXP 130  06/09/11: CXR:    NAD    Assessment:     Asthma Moderate persistent asthma now improved,   Plan Cont qvar 80 two puff twice daily Cont singulair Rov 4 months     Updated Medication List Outpatient Encounter Prescriptions as of 06/10/2011  Medication Sig Dispense Refill  . beclomethasone (QVAR) 80 MCG/ACT inhaler Inhale 2 puffs into the lungs 2 (two) times daily.  1 Inhaler  6  . Calcium-Magnesium-Zinc 1000-400-15 MG TABS Take 1 tablet by mouth daily.        . CELEBREX 200 MG capsule Take 1 capsule by mouth Daily.      . Cholecalciferol (VITAMIN D-3) 5000 UNITS TABS Take by mouth daily.        . citalopram (CELEXA) 40 MG tablet 1.5 tablets once daily       . clorazepate (TRANXENE) 7.5 MG tablet Take 7.5 mg by mouth 3 (three) times daily.       . Coenzyme Q10 (CO Q-10 PO) Take by mouth daily.        [name] esomeprazole (NEXIUM) 40 MG capsule Take 40 mg by mouth daily before breakfast.        .  EVISTA 60 MG tablet Once daily      . Flaxseed, Linseed, (FLAX SEED OIL PO) Take by mouth. 1 tsp daily       . HYDROcodone-acetaminophen (NORCO) 10-325 MG per tablet 1 every 6 hours as needed      . Multiple Vitamin (MULTIVITAMIN) tablet Take 1 tablet by mouth daily.        [name] NASONEX 50 MCG/ACT nasal spray 2 sprays by Nasal route Daily.      [name] PROAIR HFA 108 (90 BASE) MCG/ACT inhaler 1 puff every 4-6 hours as needed      . ZETIA 10 MG tablet Once at bedtime      . DISCONTD: SINGULAIR 10 MG tablet Once tablet at bedtime      . montelukast (SINGULAIR) 10 MG tablet Take 1 tablet (10 mg total) by mouth at bedtime.  90 tablet  4       Updated Medication List Outpatient Encounter Prescriptions as of 06/10/2011  Medication Sig Dispense Refill  . beclomethasone (QVAR) 80 MCG/ACT inhaler Inhale 2 puffs into the lungs 2 (two) times daily.  1 Inhaler  6  . Calcium-Magnesium-Zinc 1000-400-15 MG TABS Take 1 tablet by mouth daily.        . CELEBREX 200 MG capsule Take 1 capsule by mouth Daily.      . Cholecalciferol  (VITAMIN D-3) 5000 UNITS TABS Take by mouth daily.        . citalopram (CELEXA) 40 MG tablet 1.5 tablets once daily       . clorazepate (TRANXENE) 7.5 MG tablet Take 7.5 mg by mouth 3 (three) times daily.       . Coenzyme Q10 (CO Q-10 PO) Take by mouth daily.        [name] esomeprazole (NEXIUM) 40 MG capsule Take 40 mg by mouth daily before breakfast.        . EVISTA 60 MG tablet Once daily      . Flaxseed, Linseed, (FLAX SEED OIL PO) Take by mouth. 1 tsp daily       . HYDROcodone-acetaminophen (NORCO) 10-325 MG per tablet 1 every 6 hours as needed      . Multiple Vitamin (MULTIVITAMIN) tablet Take 1 tablet by mouth daily.        [name] NASONEX 50 MCG/ACT nasal spray 2 sprays by Nasal route Daily.      [name] PROAIR HFA 108 (90 BASE) MCG/ACT inhaler 1 puff every 4-6 hours as needed      . SINGULAIR 10 MG tablet Once tablet at bedtime      . ZETIA 10 MG tablet Once at bedtime             Plan:

## 2011-06-14 ENCOUNTER — Other Ambulatory Visit: Payer: Self-pay | Admitting: Internal Medicine

## 2011-06-14 DIAGNOSIS — R928 Other abnormal and inconclusive findings on diagnostic imaging of breast: Secondary | ICD-10-CM

## 2011-06-21 ENCOUNTER — Other Ambulatory Visit: Payer: Self-pay | Admitting: Internal Medicine

## 2011-06-21 ENCOUNTER — Ambulatory Visit
Admission: RE | Admit: 2011-06-21 | Discharge: 2011-06-21 | Disposition: A | Discharge status: Home / Self Care | Payer: Medicare Other | Source: Ambulatory Visit | Attending: Internal Medicine | Admitting: Internal Medicine

## 2011-06-21 ENCOUNTER — Other Ambulatory Visit: Payer: Self-pay | Admitting: Family Medicine

## 2011-06-21 DIAGNOSIS — R928 Other abnormal and inconclusive findings on diagnostic imaging of breast: Secondary | ICD-10-CM

## 2011-06-21 DIAGNOSIS — Z9889 Other specified postprocedural states: Secondary | ICD-10-CM

## 2011-06-22 ENCOUNTER — Other Ambulatory Visit: Payer: Self-pay | Admitting: Internal Medicine

## 2011-06-22 DIAGNOSIS — C50911 Malignant neoplasm of unspecified site of right female breast: Secondary | ICD-10-CM

## 2011-06-23 ENCOUNTER — Encounter (INDEPENDENT_AMBULATORY_CARE_PROVIDER_SITE_OTHER): Payer: Self-pay | Admitting: Surgery

## 2011-06-23 ENCOUNTER — Ambulatory Visit (INDEPENDENT_AMBULATORY_CARE_PROVIDER_SITE_OTHER): Payer: Medicare Other | Admitting: Surgery

## 2011-06-23 ENCOUNTER — Other Ambulatory Visit (INDEPENDENT_AMBULATORY_CARE_PROVIDER_SITE_OTHER): Payer: Self-pay | Admitting: Surgery

## 2011-06-23 VITALS — BP 118/72 | HR 80 | Temp 97.4°F | Resp 16 | Ht 61.0 in | Wt 147.2 lb

## 2011-06-23 DIAGNOSIS — C50911 Malignant neoplasm of unspecified site of right female breast: Secondary | ICD-10-CM

## 2011-06-23 DIAGNOSIS — C50919 Malignant neoplasm of unspecified site of unspecified female breast: Secondary | ICD-10-CM

## 2011-06-23 LAB — DIFFERENTIAL
Basophils Absolute: 0
Eosinophils Absolute: 0
Eosinophils Relative: 1
Lymphocytes Relative: 29
Lymphs Abs: 1.9
Neutrophils Relative %: 64

## 2011-06-23 LAB — URINALYSIS, ROUTINE W REFLEX MICROSCOPIC
Bilirubin Urine: NEGATIVE
Hgb urine dipstick: NEGATIVE
Nitrite: NEGATIVE
Protein, ur: NEGATIVE
Urobilinogen, UA: 0.2

## 2011-06-23 LAB — COMPREHENSIVE METABOLIC PANEL
AST: 21
CO2: 27
Calcium: 9.1
Chloride: 103
Creatinine, Ser: 0.75
GFR calc Af Amer: >60
GFR calc non Af Amer: >60
Glucose, Bld: 91
Total Bilirubin: 0.6

## 2011-06-23 LAB — D-DIMER, QUANTITATIVE: D-Dimer, Quant: 0.43

## 2011-06-23 LAB — LIPASE, BLOOD: Lipase: 18

## 2011-06-23 LAB — CBC
HCT: 39.1
Hemoglobin: 13.3
MCHC: 34.1
MCV: 90.2
RBC: 4.33
WBC: 6.7

## 2011-06-23 NOTE — Patient Instructions (Signed)
Will contact you with information from breast conference next Wednesday.  TMG

## 2011-06-23 NOTE — Progress Notes (Signed)
Chief Complaint  Patient presents with  . New Evaluation    breast cancer - referral by Dr. Norva Pavlov, primary care Dr. Lucky Cowboy    HISTORY: Patient is a 77 year old female referred from radiology and her primary physician referred newly diagnosed right breast carcinoma. Patient had a previous right breast cancer treated in 2000 with lumpectomy and axillary lymph node biopsy. She did not receive chemotherapy. She did receive adjuvant radiation therapy.  In June 2012 the patient noted some nodularity in the right breast. She recognized this as a change in her self-examination. She underwent screening mammogram in September 2012. An abnormality was seen in the upper right breast. She returned for additional views this week which confirmed a 12-14 mm lesion in the upper right breast with calcifications. Ultrasound-guided core needle biopsy was obtained and shows invasive ductal carcinoma with calcifications. Ductal carcinoma in situ is identified. Lymphovascular invasion is also identified. MRI of the bilateral breast was performed yesterday. Results are pending.  The patient presents today for surgical evaluation. There is no family history of breast cancer. Patient has not noted any other masses or lymph nodes which appear abnormal.   Past Medical History  Diagnosis Date  . Asthma   . Hyperlipemia   . Breast cancer   . Spinal stenosis   . DDD (degenerative disc disease)   . Osteoporosis   . Osteoarthritis   . Kidney stones      Current Outpatient Prescriptions  Medication Sig Dispense Refill  . beclomethasone (QVAR) 80 MCG/ACT inhaler Inhale 2 puffs into the lungs 2 (two) times daily.  1 Inhaler  6  . Calcium-Magnesium-Zinc 1000-400-15 MG TABS Take 1 tablet by mouth daily.        . CELEBREX 200 MG capsule Take 1 capsule by mouth Daily.      . Cholecalciferol (VITAMIN D-3) 5000 UNITS TABS Take by mouth daily.        . citalopram (CELEXA) 40 MG tablet 1.5 tablets once daily        . clorazepate (TRANXENE) 7.5 MG tablet Take 7.5 mg by mouth 3 (three) times daily.       . Coenzyme Q10 (CO Q-10 PO) Take by mouth daily.        [name] esomeprazole (NEXIUM) 40 MG capsule Take 40 mg by mouth daily before breakfast.        . EVISTA 60 MG tablet Once daily      . Flaxseed, Linseed, (FLAX SEED OIL PO) Take by mouth. 1 tsp daily       . HYDROcodone-acetaminophen (NORCO) 10-325 MG per tablet 1 every 6 hours as needed      . montelukast (SINGULAIR) 10 MG tablet Take 1 tablet (10 mg total) by mouth at bedtime.  90 tablet  4  . Multiple Vitamin (MULTIVITAMIN) tablet Take 1 tablet by mouth daily.        [name] NASONEX 50 MCG/ACT nasal spray 2 sprays by Nasal route Daily.      [name] PROAIR HFA 108 (90 BASE) MCG/ACT inhaler 1 puff every 4-6 hours as needed      . ZETIA 10 MG tablet Once at bedtime         No Known Allergies   Family History  Problem Relation Age of Onset  . Emphysema Mother   . Asthma Brother   . Asthma Daughter   . Asthma Grandchild   . Asthma Brother     deceased  . Heart disease Mother   . Heart  disease Father   . Heart disease Brother   . Pancreatic cancer Mother   . Lymphoma Brother      History   Social History  . Marital Status: Divorced    Spouse Name: N/A    Number of Children: 4  . Years of Education: N/A   Occupational History  . Retired Other    Nursing   Social History Main Topics  . Smoking status: Never Smoker   . Smokeless tobacco: Never Used  . Alcohol Use: No  . Drug Use: No  . Sexually Active: None   Other Topics Concern  . None   Social History Narrative  . None     REVIEW OF SYSTEMS - PERTINENT POSITIVES ONLY: Patient noted new nodularity right breast in June 2012. She denies any lymphadenopathy. She denies any pain. Right nipple has been in inverted long term. No abnormalities in the right breast on self-examination.   EXAM: Filed Vitals:   06/23/11 0831  BP: 118/72  Pulse: 80  Temp: 97.4 F (36.3 C)  Resp:  16    HEENT: normocephalic; pupils equal and reactive; sclerae clear; dentition good; mucous membranes moist NECK:  No palpable nodules; symmetric on extension; no palpable anterior or posterior cervical lymphadenopathy; no supraclavicular masses; no tenderness CHEST: clear to auscultation bilaterally without rales, rhonchi, or wheezes BREAST: Right breast shows a well-healed surgical wound at the inframammary crease. There is also a well-healed surgical wound in the right axilla and palpation shows dense breast parenchyma. There is slight ecchymosis at the 12:00 position of the right breast. There are no palpable masses. There is mild tenderness. Left breast shows normal nipple areolar complex. Breast parenchyma is relatively uniform without discrete or dominant mass. There is no left axillary adenopathy. CARDIAC: regular rate and rhythm without significant murmur; peripheral pulses are full EXT:  non-tender without edema; no deformity NEURO: no gross focal deficits; no sign of tremor   LABORATORY RESULTS: See E-Chart for most recent results   RADIOLOGY RESULTS: See E-Chart or I-Site for most recent results   IMPRESSION: #1 new right breast invasive ductal carcinoma, 12 mm, with calcifications #2 personal history of right breast carcinoma, status post lumpectomy and adjuvant radiation therapy #3 chronic obstructive pulmonary disease   PLAN: I discussed with the patient and her daughter all of the above issues. We reviewed her diagnostic studies and her pathology report. We discussed surgical options.  Patient is adamant that mastectomy be performed. She is not interested in breast conservation. Given her history of radiation therapy to the right breast, she may need reconstruction after mastectomy with a TRAM flap. Certainly this is something to be reviewed and with the plastic surgeons.  Patient will be presented at the next breast conference. We will need to discuss with the  radiation oncologist whether adjuvant radiation therapy is possible at this time. Also the medical oncologist will need to review the details of the surgical pathology from both her prior tumor and the current tumor.  I will list the patient for discussion at the next breast conference. I will arrange for plastic surgery consultation. I will contact the patient following breast conference with further recommendations for management.   Velora Heckler, MD, FACS General & Endocrine Surgery [facility]      Visit Diagnoses: 1. Breast cancer     Primary Care Physician: Nadean Corwin, MD

## 2011-06-24 ENCOUNTER — Other Ambulatory Visit (INDEPENDENT_AMBULATORY_CARE_PROVIDER_SITE_OTHER): Payer: Self-pay | Admitting: Surgery

## 2011-06-24 DIAGNOSIS — N650 Deformity of reconstructed breast: Secondary | ICD-10-CM

## 2011-06-29 ENCOUNTER — Ambulatory Visit
Admission: RE | Admit: 2011-06-29 | Discharge: 2011-06-29 | Disposition: A | Discharge status: Home / Self Care | Payer: Medicare Other | Source: Ambulatory Visit | Attending: Internal Medicine | Admitting: Internal Medicine

## 2011-06-29 DIAGNOSIS — C50911 Malignant neoplasm of unspecified site of right female breast: Secondary | ICD-10-CM

## 2011-06-29 LAB — COMPREHENSIVE METABOLIC PANEL
ALT: 28
AST: 24
Albumin: 3.7
Alkaline Phosphatase: 52
BUN: 14
CO2: 25
Calcium: 9.4
Chloride: 96
Creatinine, Ser: 0.88
GFR calc Af Amer: >60
GFR calc non Af Amer: >60
Glucose, Bld: 124 — ABNORMAL HIGH
Potassium: 4.3
Sodium: 131 — ABNORMAL LOW
Total Bilirubin: 0.6
Total Protein: 6.5

## 2011-06-29 LAB — PROTIME-INR
INR: 0.9
Prothrombin Time: 12

## 2011-06-29 LAB — URINALYSIS, ROUTINE W REFLEX MICROSCOPIC
Bilirubin Urine: NEGATIVE
Glucose, UA: NEGATIVE
Hgb urine dipstick: NEGATIVE
Ketones, ur: NEGATIVE
Nitrite: NEGATIVE
Protein, ur: NEGATIVE
Specific Gravity, Urine: 1.014
Urobilinogen, UA: 0.2
pH: 7.5

## 2011-06-29 LAB — BASIC METABOLIC PANEL
Calcium: 8.7
Creatinine, Ser: 0.77
GFR calc Af Amer: >60
GFR calc non Af Amer: >60
Sodium: 136

## 2011-06-29 LAB — URINE CULTURE: Colony Count: 8000

## 2011-06-29 LAB — POCT I-STAT, CHEM 8
BUN: 17
Calcium, Ion: 1.11 — ABNORMAL LOW
Chloride: 98
Creatinine, Ser: 1.1
Glucose, Bld: 131 — ABNORMAL HIGH
HCT: 44
Hemoglobin: 15
Potassium: 4.5
Sodium: 131 — ABNORMAL LOW
TCO2: 28

## 2011-06-29 LAB — CBC
HCT: 42.5
Hemoglobin: 14.6
MCHC: 34.3
MCV: 90.3
Platelets: 351
RBC: 4.71
RDW: 12.9
WBC: 11.7 — ABNORMAL HIGH

## 2011-06-29 LAB — DIFFERENTIAL
Basophils Absolute: 0
Basophils Relative: 0
Eosinophils Absolute: 0
Monocytes Relative: 2 — ABNORMAL LOW
Neutrophils Relative %: 87 — ABNORMAL HIGH

## 2011-06-29 MED ORDER — GADOBENATE DIMEGLUMINE 529 MG/ML IV SOLN
13.0000 mL | Freq: Once | INTRAVENOUS | Status: AC | PRN
  Administered 2011-06-29: 13 mL via INTRAVENOUS

## 2011-06-30 ENCOUNTER — Telehealth (INDEPENDENT_AMBULATORY_CARE_PROVIDER_SITE_OTHER): Payer: Self-pay | Admitting: Surgery

## 2011-06-30 DIAGNOSIS — C50919 Malignant neoplasm of unspecified site of unspecified female breast: Secondary | ICD-10-CM

## 2011-06-30 NOTE — Telephone Encounter (Signed)
Discussed breast cancer conference today.  Reviewed her visit with Dr. Odis Luster.  She does not wish reconstruction at present.  She does desire bilateral mastectomy.  Will request pulmonary "clearance" from Dr. Delford Field.  Will request oncology consultation from Dr. Darrold Span.  TMG

## 2011-07-01 ENCOUNTER — Encounter: Payer: Self-pay | Admitting: Critical Care Medicine

## 2011-07-01 ENCOUNTER — Telehealth: Payer: Self-pay | Admitting: Critical Care Medicine

## 2011-07-01 ENCOUNTER — Other Ambulatory Visit (INDEPENDENT_AMBULATORY_CARE_PROVIDER_SITE_OTHER): Payer: Self-pay | Admitting: Surgery

## 2011-07-01 DIAGNOSIS — C50911 Malignant neoplasm of unspecified site of right female breast: Secondary | ICD-10-CM

## 2011-07-01 NOTE — Telephone Encounter (Signed)
Called and spoke with pt. Pt aware no appt needed and PW will send letter to Dr. Gerrit Friends.  Pt verbalized understanding and nothing further needed.

## 2011-07-01 NOTE — Telephone Encounter (Signed)
Letter is done No appt needed

## 2011-07-01 NOTE — Telephone Encounter (Signed)
Called and spoke with pt.  Pt states she has been dx with breast cancer and is need pulmonary surgical clearance.  Pt was recently seen by PW on 06/10/11.  Pt was unsure if she needed to make an appt for surgical clearance or if ok letter could be faxed to Dr. Gerrit Friends.  PW, please advise.  Thanks.

## 2011-07-02 NOTE — Telephone Encounter (Signed)
Dr. Shan Levans has cleared patient for surgery.

## 2011-07-12 ENCOUNTER — Telehealth (INDEPENDENT_AMBULATORY_CARE_PROVIDER_SITE_OTHER): Payer: Self-pay | Admitting: Surgery

## 2011-07-12 DIAGNOSIS — C50919 Malignant neoplasm of unspecified site of unspecified female breast: Secondary | ICD-10-CM

## 2011-07-12 NOTE — Telephone Encounter (Signed)
Called patient and left message.  Pulmonary has cleared for OR.  MRI shows only single tumor in right breast.  Patient desires bilateral mastectomy.  Will also sample lymph nodes in right axilla.  Will call patient to schedule OR.  TMG

## 2011-07-14 ENCOUNTER — Telehealth (INDEPENDENT_AMBULATORY_CARE_PROVIDER_SITE_OTHER): Payer: Self-pay | Admitting: Surgery

## 2011-07-14 NOTE — Telephone Encounter (Signed)
This was routed to me. Please address.

## 2011-07-16 NOTE — Telephone Encounter (Signed)
Return call to patient no answer.  Left message for patient to call back with more detail about her request for "back injection".

## 2011-07-19 ENCOUNTER — Telehealth (INDEPENDENT_AMBULATORY_CARE_PROVIDER_SITE_OTHER): Payer: Self-pay | Admitting: Surgery

## 2011-07-19 NOTE — Telephone Encounter (Signed)
Ok per Dr. Gerrit Friends for back injection.

## 2011-07-26 ENCOUNTER — Encounter (HOSPITAL_COMMUNITY)
Admission: RE | Admit: 2011-07-26 | Discharge: 2011-07-26 | Disposition: A | Discharge status: Home / Self Care | Payer: Medicare Other | Source: Ambulatory Visit | Attending: Surgery | Admitting: Surgery

## 2011-07-26 LAB — PROTIME-INR
INR: 0.99 (ref 0.00–1.49)
Prothrombin Time: 13.3 seconds (ref 11.6–15.2)

## 2011-07-26 LAB — CBC
HCT: 42.1 % (ref 36.0–46.0)
Hemoglobin: 14.1 g/dL (ref 12.0–15.0)
RDW: 12.8 % (ref 11.5–15.5)
WBC: 12.1 10*3/uL — ABNORMAL HIGH (ref 4.0–10.5)

## 2011-07-26 LAB — DIFFERENTIAL
Basophils Absolute: 0 10*3/uL (ref 0.0–0.1)
Basophils Relative: 0 % (ref 0–1)
Lymphocytes Relative: 11 % — ABNORMAL LOW (ref 12–46)
Neutro Abs: 10.5 10*3/uL — ABNORMAL HIGH (ref 1.7–7.7)

## 2011-07-26 LAB — URINALYSIS, ROUTINE W REFLEX MICROSCOPIC
Glucose, UA: NEGATIVE mg/dL
Hgb urine dipstick: NEGATIVE
Leukocytes, UA: NEGATIVE
Protein, ur: NEGATIVE mg/dL
Specific Gravity, Urine: 1.004 — ABNORMAL LOW (ref 1.005–1.030)
Urobilinogen, UA: 0.2 mg/dL (ref 0.0–1.0)

## 2011-07-26 LAB — BASIC METABOLIC PANEL
BUN: 13 mg/dL (ref 6–23)
Chloride: 103 mEq/L (ref 96–112)
Creatinine, Ser: 0.84 mg/dL (ref 0.50–1.10)
GFR calc Af Amer: 76 mL/min — ABNORMAL LOW (ref >90)
GFR calc non Af Amer: 66 mL/min — ABNORMAL LOW (ref >90)

## 2011-07-26 LAB — SURGICAL PCR SCREEN: MRSA, PCR: NEGATIVE

## 2011-07-27 ENCOUNTER — Other Ambulatory Visit: Payer: Self-pay | Admitting: Oncology

## 2011-07-27 ENCOUNTER — Encounter (HOSPITAL_BASED_OUTPATIENT_CLINIC_OR_DEPARTMENT_OTHER): Payer: Medicare Other | Admitting: Oncology

## 2011-07-27 DIAGNOSIS — C50319 Malignant neoplasm of lower-inner quadrant of unspecified female breast: Secondary | ICD-10-CM

## 2011-07-27 DIAGNOSIS — Z87898 Personal history of other specified conditions: Secondary | ICD-10-CM

## 2011-07-27 DIAGNOSIS — Z170 Estrogen receptor positive status [ER+]: Secondary | ICD-10-CM

## 2011-07-27 LAB — COMPREHENSIVE METABOLIC PANEL
AST: 14 U/L (ref 0–37)
Albumin: 4.1 g/dL (ref 3.5–5.2)
Alkaline Phosphatase: 32 U/L — ABNORMAL LOW (ref 39–117)
BUN: 14 mg/dL (ref 6–23)
Creatinine, Ser: 0.82 mg/dL (ref 0.50–1.10)
Glucose, Bld: 86 mg/dL (ref 70–99)
Potassium: 4 mEq/L (ref 3.5–5.3)
Total Bilirubin: 0.5 mg/dL (ref 0.3–1.2)

## 2011-07-27 LAB — CEA: CEA: 1.4 ng/mL (ref 0.0–5.0)

## 2011-07-29 ENCOUNTER — Other Ambulatory Visit (INDEPENDENT_AMBULATORY_CARE_PROVIDER_SITE_OTHER): Payer: Self-pay | Admitting: Surgery

## 2011-07-29 ENCOUNTER — Inpatient Hospital Stay (HOSPITAL_COMMUNITY)
Admission: RE | Admit: 2011-07-29 | Discharge: 2011-08-01 | DRG: 581 | Disposition: A | Discharge status: Home Health | Payer: Medicare Other | Source: Ambulatory Visit | Attending: Surgery | Admitting: Surgery

## 2011-07-29 DIAGNOSIS — D0590 Unspecified type of carcinoma in situ of unspecified breast: Secondary | ICD-10-CM

## 2011-07-29 DIAGNOSIS — J42 Unspecified chronic bronchitis: Secondary | ICD-10-CM | POA: Diagnosis present

## 2011-07-29 DIAGNOSIS — Z01812 Encounter for preprocedural laboratory examination: Secondary | ICD-10-CM

## 2011-07-29 DIAGNOSIS — C50919 Malignant neoplasm of unspecified site of unspecified female breast: Principal | ICD-10-CM | POA: Diagnosis present

## 2011-07-29 DIAGNOSIS — Z880 Allergy status to penicillin: Secondary | ICD-10-CM

## 2011-07-29 HISTORY — PX: MASTECTOMY: SHX3

## 2011-07-31 MED ORDER — PROMETHAZINE HCL 25 MG/ML IJ SOLN: 12.5000 mg | INTRAMUSCULAR | Status: DC | PRN

## 2011-07-31 MED ORDER — ACETAMINOPHEN 325 MG PO TABS: 650.0000 mg | ORAL_TABLET | ORAL | Status: DC | PRN

## 2011-07-31 MED ORDER — ALBUTEROL SULFATE HFA 108 (90 BASE) MCG/ACT IN AERS
2.0000 | INHALATION_SPRAY | RESPIRATORY_TRACT | Status: DC | PRN
  Filled 2011-07-31: qty 6.7

## 2011-07-31 MED ORDER — CITALOPRAM HYDROBROMIDE 40 MG PO TABS
40.0000 mg | ORAL_TABLET | Freq: Every day | ORAL | Status: DC
  Filled 2011-07-31: qty 1

## 2011-07-31 MED ORDER — CITALOPRAM HYDROBROMIDE 20 MG PO TABS
20.0000 mg | ORAL_TABLET | Freq: Every day | ORAL | Status: DC
  Administered 2011-08-01: 20 mg via ORAL
  Filled 2011-07-31 (×2): qty 1

## 2011-07-31 MED ORDER — TRAMADOL HCL 50 MG PO TABS
50.0000 mg | ORAL_TABLET | Freq: Four times a day (QID) | ORAL | Status: DC
  Filled 2011-07-31 (×7): qty 1

## 2011-07-31 MED ORDER — MONTELUKAST SODIUM 10 MG PO TABS
10.0000 mg | ORAL_TABLET | Freq: Every day | ORAL | Status: DC
  Filled 2011-07-31: qty 1

## 2011-07-31 MED ORDER — FLUTICASONE PROPIONATE HFA 44 MCG/ACT IN AERO
2.0000 | INHALATION_SPRAY | Freq: Two times a day (BID) | RESPIRATORY_TRACT | Status: DC
  Administered 2011-08-01: 2 via RESPIRATORY_TRACT
  Filled 2011-07-31: qty 10.6

## 2011-07-31 MED ORDER — HYDROMORPHONE HCL PF 1 MG/ML IJ SOLN: 1.0000 mg | INTRAMUSCULAR | Status: DC | PRN

## 2011-07-31 MED ORDER — PANTOPRAZOLE SODIUM 40 MG PO TBEC
80.0000 mg | DELAYED_RELEASE_TABLET | Freq: Every day | ORAL | Status: DC
  Administered 2011-08-01: 80 mg via ORAL
  Filled 2011-07-31: qty 2

## 2011-07-31 MED ORDER — TRAMADOL HCL 50 MG PO TABS
50.0000 mg | ORAL_TABLET | Freq: Four times a day (QID) | ORAL | Status: DC | PRN
  Filled 2011-07-31: qty 1

## 2011-07-31 MED ORDER — HYDROCODONE-ACETAMINOPHEN 5-325 MG PO TABS
1.0000 | ORAL_TABLET | ORAL | Status: DC | PRN
  Administered 2011-08-01 (×3): 2 via ORAL
  Filled 2011-07-31: qty 2

## 2011-07-31 MED ORDER — RALOXIFENE HCL 60 MG PO TABS
60.0000 mg | ORAL_TABLET | Freq: Every day | ORAL | Status: DC
Start: 2011-08-01 — End: 2011-08-01
  Administered 2011-08-01: 60 mg via ORAL
  Filled 2011-07-31: qty 1

## 2011-07-31 MED ORDER — MAGNESIUM HYDROXIDE 400 MG/5ML PO SUSP: 30.0000 mL | Freq: Four times a day (QID) | ORAL | Status: DC | PRN

## 2011-07-31 MED ORDER — SODIUM CHLORIDE 0.9 % IJ SOLN
3.0000 mL | Freq: Two times a day (BID) | INTRAMUSCULAR | Status: DC
  Administered 2011-07-31 – 2011-08-01 (×2): 3 mL via INTRAVENOUS

## 2011-07-31 MED ORDER — LORAZEPAM 1 MG PO TABS
1.0000 mg | ORAL_TABLET | Freq: Four times a day (QID) | ORAL | Status: DC | PRN
  Administered 2011-08-01 (×2): 1 mg via ORAL
  Filled 2011-07-31: qty 1

## 2011-07-31 MED ORDER — FLUTICASONE PROPIONATE 50 MCG/ACT NA SUSP
2.0000 | Freq: Every day | NASAL | Status: DC
  Filled 2011-07-31: qty 16

## 2011-08-01 MED ORDER — TRAMADOL HCL 50 MG PO TABS: 50.0000 mg | ORAL_TABLET | Freq: Four times a day (QID) | ORAL | Status: DC | PRN

## 2011-08-01 MED ORDER — TRAMADOL HCL 50 MG PO TABS: 50.0000 mg | ORAL_TABLET | Freq: Four times a day (QID) | ORAL | Status: AC | PRN

## 2011-08-01 MED ORDER — LORAZEPAM 1 MG PO TABS: 1.0000 mg | ORAL_TABLET | Freq: Four times a day (QID) | ORAL | Status: AC | PRN

## 2011-08-01 NOTE — Progress Notes (Signed)
  Subjective: Patient is comfortable with Ultram/ PRN Ativan.  Minimal complaints.  Tolerating regular diet  Objective: Vital signs in last 24 hours: Temp:  [97.5 F (36.4 C)] 97.5 F (36.4 C) (11/04 0700) Pulse Rate:  [64-66] 66  (11/04 0700) Resp:  [18] 18  (11/04 0700) BP: (103-129)/(46-62) 129/62 mmHg (11/04 0700) SpO2:  [95 %-98 %] 95 % (11/04 0700)    Intake/Output from previous day: 11/03 0701 - 11/04 0700 In: -  Out: 1190 [Urine:1100; Drains:90]  Drain output is serosanguinous Intake/Output this shift:    General appearance: alert and no distress Breasts: normal appearance, no masses or tenderness, No obvious hematoma/ skin flaps viable Incision/Wound: clean, dry  Lab Results:  No results found for this basename: WBC:2,HGB:2,HCT:2,PLT:2 in the last 72 hours BMET No results found for this basename: NA:2,K:2,CL:2,CO2:2,GLUCOSE:2,BUN:2,CREATININE:2,CALCIUM:2 in the last 72 hours PT/INR No results found for this basename: LABPROT:2,INR:2 in the last 72 hours ABG No results found for this basename: PHART:2,PCO2:2,PO2:2,HCO3:2 in the last 72 hours  Studies/Results: No results found.  Anti-infectives: Anti-infectives    None      Assessment/Plan: s/p bilateral mastectomies with right axillary lymph node dissection Discharge  LOS: 3 days    Mihir Flanigan K. 08/01/2011

## 2011-08-02 ENCOUNTER — Telehealth (INDEPENDENT_AMBULATORY_CARE_PROVIDER_SITE_OTHER): Payer: Self-pay

## 2011-08-02 ENCOUNTER — Telehealth (INDEPENDENT_AMBULATORY_CARE_PROVIDER_SITE_OTHER): Payer: Self-pay | Admitting: General Surgery

## 2011-08-02 ENCOUNTER — Telehealth: Payer: Self-pay

## 2011-08-02 ENCOUNTER — Telehealth: Payer: Self-pay | Admitting: *Deleted

## 2011-08-02 NOTE — Telephone Encounter (Signed)
Addressed on 08/02/2011

## 2011-08-02 NOTE — Discharge Summary (Signed)
Physician Discharge Summary  Patient ID: Angel [name] MRN: [id number] DOB/AGE: 75-Dec-1933 75 y.o.  Admit date: 07/29/2011 Discharge date: 08/02/2011  Admission Diagnoses: right breast cancer   Discharge Diagnoses: right breast cancer  Active Problems:  * No active hospital problems. *    Discharged Condition: good  Hospital Course: Patient was admitted on the day of surgery. She was taken directly to the operating room where she underwent bilateral mastectomy and right axillary lymph node dissection. Postoperative course was uneventful. Pain control was achieved with intravenous narcotics and oral narcotics. Wound care was provided and the patient and her family were instructed in drain care. The patient was prepared for discharge home on the third hospital day.  Consults: none  Significant Diagnostic Studies: None  Treatments: surgery: bilateral mastectomy and right axillary lymph node dissection  Discharge Exam: Blood pressure 120/52, pulse 66, temperature 98.1 F (36.7 C), temperature source Oral, resp. rate 18, height 5\' 2"  (1.575 m), weight 147 lb 11.3 oz (67 kg), SpO2 94.00%. See discharge note.  Disposition: Home or Self Care  Discharge Orders    Future Orders Please Complete By Expires   Diet - low sodium heart healthy      Increase activity slowly      Discharge instructions      Comments:   See discharge instructions   Call MD for:  temperature >100.4      Call MD for:  persistant nausea and vomiting      Call MD for:  severe uncontrolled pain      Call MD for:  redness, tenderness, or signs of infection (pain, swelling, redness, odor or green/yellow discharge around incision site)      Call MD for:  difficulty breathing, headache or visual disturbances        Discharge Medication List as of 08/01/2011 10:20 AM    START taking these medications   Details  LORazepam (ATIVAN) 1 MG tablet Take 1 tablet (1 mg total) by mouth every 6 (six) hours as needed  for anxiety., Starting 08/01/2011, Until Wed 08/11/11, Print      CONTINUE these medications which have CHANGED   Details  traMADol (ULTRAM) 50 MG tablet Take 1 tablet (50 mg total) by mouth every 6 (six) hours as needed (pain). Maximum dose= 8 tablets per day, Starting 08/01/2011, Until Wed 08/11/11, Print      CONTINUE these medications which have NOT CHANGED   Details  beclomethasone (QVAR) 80 MCG/ACT inhaler Inhale 2 puffs into the lungs 2 (two) times daily., Starting 02/11/2011, Until Fri 02/11/12, Normal    Calcium-Magnesium-Zinc 1000-400-15 MG TABS Take 1 tablet by mouth daily.  , Until Discontinued, Historical Med    CELEBREX 200 MG capsule Take 1 capsule by mouth Daily., Starting 01/19/2011, Until Discontinued, Historical Med    Cholecalciferol (VITAMIN D-3) 5000 UNITS TABS Take 1 tablet by mouth daily. , Until Discontinued, Historical Med    citalopram (CELEXA) 40 MG tablet Take 40 mg by mouth daily. , Until Discontinued, Historical Med    clorazepate (TRANXENE) 7.5 MG tablet Take 7.5 mg by mouth 3 (three) times daily. , Until Discontinued, Historical Med    Coenzyme Q10 (CO Q-10 PO) Take 1 tablet by mouth daily. , Until Discontinued, Historical Med    esomeprazole (NEXIUM) 40 MG capsule Take 40 mg by mouth daily before breakfast.  , Starting 01/26/2011, Until Discontinued, Historical Med    EVISTA 60 MG tablet Take 60 mg by mouth daily. Once daily, Until Discontinued, Historical  Med    ezetimibe (ZETIA) 10 MG tablet Take 10 mg by mouth daily.  , Until Discontinued, Historical Med    Flaxseed, Linseed, (FLAX SEED OIL PO) Take 5 mLs by mouth daily. 1 tsp daily, Until Discontinued, Historical Med    HYDROcodone-acetaminophen (NORCO) 10-325 MG per tablet Take 1 tablet by mouth every 6 (six) hours as needed. For pain, Until Discontinued, Historical Med    montelukast (SINGULAIR) 10 MG tablet Take 1 tablet (10 mg total) by mouth at bedtime., Starting 06/10/2011, Until Fri 06/09/12,  Normal    Multiple Vitamins-Minerals (MULTIVITAMINS THER. W/MINERALS) TABS Take 1 tablet by mouth daily.  , Until Discontinued, Historical Med    NASONEX 50 MCG/ACT nasal spray 2 sprays by Nasal route Daily., Until Discontinued, Historical Med    PROAIR HFA 108 (90 BASE) MCG/ACT inhaler Inhale 2 puffs into the lungs every 4 (four) hours as needed. For shortness of breath, Until Discontinued, Historical Med       Follow-up Information    Follow up with [facility] Nurse for drain care)    Contact information:   [phone]        Follow up at [facility] this week for wound check and drain removal.  Call [phone].  Signed: Velora Heckler 08/02/2011, 8:58 AM

## 2011-08-02 NOTE — Progress Notes (Unsigned)
RECEIVED BONE DENSITY SCAN REPORT DATED 06-09-11 FROM [facility].  REPORT GIVEN TO DR. Darrold Span TO REVIEW.

## 2011-08-02 NOTE — Telephone Encounter (Signed)
Rx verified and called back to [facility] pharmacy. Patient aware.

## 2011-08-02 NOTE — Telephone Encounter (Signed)
Called back patient at Robin's request advising her to take the prescription to the pharmacy, and ask them to call [facility] directly and the information will be verified over the phone.

## 2011-08-02 NOTE — Telephone Encounter (Signed)
Patient called c/o pain at surgical site. Rx not yet filled- patient stated there is no dosage. Patient advised to have pharmacy to call us and we would verify dosage when filled. Patient also stated she had no one to take care of her needs. After talking with patient she decided to call her minister. [facility] is scheduled to visit the patient later today stated patient.

## 2011-08-02 NOTE — Progress Notes (Signed)
CC:   Angel Heckler, MD Lucky Cowboy, M.D. Richard D. Ethelene Hal, M.D. Charlcie Cradle Delford Field, MD, FCCP Armanda Magic, M.D.  The patient is a very pleasant 77 year old white female seen today in consultation at the request of Dr. Darnell Level with recently diagnosed right breast carcinoma.  She is accompanied by her 3 daughters and a friend.  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  The DCIS diagnosis was made by excisional biopsy in 04/1999 with subsequent radiation and the patient then intolerant to tamoxifen, which was attempted by one of my colleagues prior to the patient transferring to my care.  From the old records and my history from the patient then, the specific problems from the tamoxifen were never completely clear to me.  She has done well in the interim until she has recently had some progressive inversion of the right nipple and [facility] then 06/21/2011 showed small spiculated mass upper portion of the right breast measuring approximately 1.4 cm and not discretely palpable at that time with ultrasound showing irregular hypoechoic nodule at 12 o'clock, 8 cm from the nipple measuring 0.8 x 0.7 x 1.2 cm with associated punctate calcifications and no evidence of adenopathy in the right axilla.  She had needle core biopsy of the right breast mass at the [facility] 06/21/2011, with pathology [phone]) invasive ductal carcinoma with calcifications as well as DCIS with lymphovascular invasion identified.  The biopsy specimen was ER positive at 93%, PR positive at 87%, elevated proliferation fraction at 35% and HER2 negative by [facility].  She had bilateral breast MRI 06/30/2011 with nothing of concern in the left breast, no axillary or internal mammary chain adenopathy and the single mass in the right breast 1.1 x 1.1 x 1 cm about 3 mm anterior to the pectoral muscle, with a thin normal fat  plane between the mass and the muscle.  She is scheduled for right mastectomy with prophylactic left breast and sentinel node evaluation by Dr. Gerrit Friends 07/29/2011 at [facility].  She had chest x-ray done in the [facility] 06/09/2011 which showed some mild right upper lobe scarring.  REVIEW OF SYSTEMS:  Increased fatigue in general, which is different for her, this since she had very prolonged bout with bronchitis and asthma, which was at its worst in 01/2011 and 02/2011.  Respiratory symptoms have improved with interventions by Dr. Danise Mina since then.  She has just completed prednisone taper with Z-Pak for a respiratory infection that has improved though she still has some cough with some minimal fairly clear sputum production.  She has not had fever this week.  She has had thyroid functions checked by Dr. Oneta Rack.  She has some chronic back pain and chronic arthritis symptoms which are unchanged.  No headaches.  She has been blind in her left eye for years.  Wears reading glasses.  No difficulty hearing.  She is up-to-date on dental exams and cleaning.  Weight has been either steady (according to the patient) or down 20 pounds in 2 months (according to the daughters).  She states her usual weight is about 138-140.  She has had no change in bowels or bladder.  No bleeding.  No history of blood clots.  She is scheduled for injection in her back by Dr. Ethelene Hal prior to the surgery, generally has this done every 4-6 months (times 12-14 years).  PAST MEDICAL HISTORY:  Intolerant to codeine and an  allergy to penicillin.  She had hysterectomy in her 30s.  Still has ovaries.  Had tonsillectomy, had benign right breast biopsy 1997 then DCIS on the right 04/1999.  She has history of osteoporosis, arthritis and depression.  She has had nonmelanoma skin cancers (Dr. Campbell Stall and Dr. Shon Hough), has had one colonoscopy done within the past 10 years. She had a cardiac catheterization in 03/2011  by Dr. Armanda Magic because of chest pain and shortness of breath then.  Reportedly one vessel borderline atherosclerotic changes and normal LV function.  She had surgery for small bowel obstruction about 15 years ago.  She remotely had breast implants and had some problems with these such that they were taken out about 25 years ago.  She has had vein stripping right lower extremity.  The patient states she had bone density [facility] with her mammograms this fall and I will request copy of that report (which is not presently in the EMR).  FAMILY HISTORY:  Mother died of pancreatic cancer at age 61.  A brother had Hodgkin's disease.  Father had cardiac problems.  A niece had breast cancer in her 87s.  The patient had 4 children, one daughter in [place] and two daughters in [place].  One daughter was previously a Teacher, early years/pre, I believe one daughter a Engineer, civil (consulting) and one an Airline pilot.  SOCIAL HISTORY:  The patient has been single for 40 years.  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  No tobacco.  PHYSICAL EXAMINATION:  General:  On exam she is easily ambulatory about the office and very talkative.  Daughters are interested in discussing a number of their own medical issues and both the patient and the daughters may be interested in genetics counseling.  Vital signs: Temperature 97.1, heart rate 80 and regular, respirations 20, blood pressure 145/82, weight 148.4 pounds, height 62 inches, meter squared 1.68.  HEENT:  Normal hair pattern.  Pupils equal, reactive, nonicteric. Oral mucosa is moist and clear.  No obvious dental problems.  Neck: Supple.  No cervical, supraclavicular adenopathy.  No JVD.  No obvious thyroid masses.  Lungs:  Clear to the bases.  Spine is not tender to direct palpation.  Heart:  Has a regular rate and rhythm without gallop. Breasts:  The left breast has no dominant masses.  No skin changes of concern.  No nipple  discharge.  Nothing in the left axilla.  No swelling left upper extremity.  Right breast has previous scarring from the DCIS surgery and radiation, with some nipple inversion.  I cannot palpate any discrete mass in the breast otherwise, no skin changes of concern, no nipple discharge, nothing palpable in the right axilla.  No swelling right upper extremity.  Abdomen:  Soft and nontender.  No appreciable organomegaly or masses.  Normally active bowel sounds.  No inguinal adenopathy.  Extremities:  Lower extremities without pitting edema, cords or tenderness.  Skin:  Without rash or petechiae.  Neurologic: Nonfocal.  LABS:  Today, sodium 136, potassium 4, chloride 102, CO2 26, glucose 86, BUN 14, creatinine 0.8, total bilirubin 0.5, alkaline phosphatase 32, GOT 14, GPT 13, total protein 6.1, albumin 4.1, calcium 9.2.  CEA 1.4 and CA27.29 16.  She also had CBC in the [facility] system 07/26/2011; white count 12.1 on the prednisone, hemoglobin 14.1, MCV 91.9, platelets 263, ANC 10.5 (on prednisone).  INR 0.9.  Urinalysis 0.2 urobilinogen, otherwise negative.  Not mentioned above, the patient has had flu  shot this fall and she has also had pneumonia vaccine and shingles vaccine.  I have talked with the patient and her family about the diagnosis in general now, though they understand we will get more specifics with full pathology from the surgical procedure upcoming.  The patient is hopeful that she will not need chemotherapy and seems generally willing to consider hormonal blockade.  I will see her back at least early December after the information from the surgery is available and she hopefully will have been recovering from that.  IMPRESSION AND PLAN: 1. Invasive ductal carcinoma with ductal carcinoma in situ on needle     biopsy of a second primary right breast cancer, which was     identified on mammogram, ER/PR strongly positive, elevated     proliferation fraction with apparent  lymphovascular space invasion     on the biopsy, HER2 negative; for right mastectomy with sentinel     node evaluation and prophylactic left mastectomy by Dr. Gerrit Friends     scheduled for 11/01. 2. 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  She subsequently was on raloxifene (we have also     discussed prevention trial information concerning the raloxifene     now). 3. Chronic arthritis and chronic back pain followed by Dr. Ethelene Hal. 4. Prolonged respiratory problem spring 2012 now followed by Dr.     Delford Field. 5. Post hysterectomy without oophorectomy, tonsillectomy and history     of non melanoma skin cancers. 6. One vessel borderline coronary artery disease by recent cath with     normal left ventricle function. 7. Previous low bone density, with recent bone density scan done at     the [facility], results not presently available.  The patient and her daughters were in agreement with this plan and understand that they can call at any time prior to scheduled return visit if other questions or concerns.    ______________________________ Reece Packer, M.D. LPL/MEDQ  D:  07/31/2011  T:  08/02/2011  Job:  [id number]

## 2011-08-02 NOTE — Telephone Encounter (Signed)
Per pof 07-27-2011 made patient appointment for 08-31-2011 starting at 1:30pm for 90 minutes

## 2011-08-02 NOTE — Telephone Encounter (Signed)
Pt called in asking if the script for the Vicodin issued after sx could be clarified. Pt stated she was not able to get if filled last night because the strength was not indicated. Pt asked to be called back once it has been done at [phone]. Pt had double mastectomy on 07/29/11.

## 2011-08-02 NOTE — Telephone Encounter (Signed)
WILL NEED TO [facility] LATER THIS WEEK TO GET COMPREHENSIVE REPORT OF BONE DENSITY DONE 06-09-11 OR CALL HER PCP DR. Oneta Rack.

## 2011-08-03 ENCOUNTER — Telehealth (INDEPENDENT_AMBULATORY_CARE_PROVIDER_SITE_OTHER): Payer: Self-pay | Admitting: Surgery

## 2011-08-03 ENCOUNTER — Telehealth: Payer: Self-pay

## 2011-08-03 NOTE — Telephone Encounter (Signed)
RECEIVED BONE DENSITY REPORT FROM [facility] RADIOLOGY DATED 06-09-11. GAVE TO DR. Darrold Span TO REVIEW.

## 2011-08-03 NOTE — Telephone Encounter (Signed)
Patient needs a 1 week appt for a po on bil masty, discharged on Sunday, 08/01/11, please call.

## 2011-08-03 NOTE — Telephone Encounter (Signed)
WANDA CLARK, DAUGHTER OF Shauniece Neuhaus CALLED TO SAY SHE THOUGHT THE NORCO 5/500 WAS TOO MUCH TYLENOL FOR HER MOTHER TO TAKE . I SAID I WOULD HAVE ROBBIN CALL HER RE THIS AND SHE TOLD ME TO HAVE ROBBIN CALL HER SISTER BRENDA LYNCH INSTEAD OF HER AND THAT BRENDA WOULD KNOW THE SITUATION. ROBBIN ADVISED ME TO CONTACT DR. GERKIN. I REVIEWED THIS WITH DR. Karie Schwalbe GERKIN PER TELEPHONE CONVERSATION AND HE SAID HE [facility] AT [phone].

## 2011-08-03 NOTE — Telephone Encounter (Signed)
A user error has taken place: encounter opened in error, closed for administrative reasons.

## 2011-08-04 ENCOUNTER — Telehealth (INDEPENDENT_AMBULATORY_CARE_PROVIDER_SITE_OTHER): Payer: Self-pay | Admitting: General Surgery

## 2011-08-04 NOTE — Telephone Encounter (Signed)
Steward Drone called yesterday 08-03-11 re social services help with her mother barb Stavola. Ms lynch felt that her mother needed around the clock assistance due to her inability to manage activities of daily living. Ms Burnadette Peter said she was told the Dr.needed to contact social services at hospital. I spoke with dr. Gerrit Friends re this matter and he said ms Maule was evaluated while in hospital by case manager/ social worker and also evaluated by Child psychotherapist with [facility] and that family should follow recommendations of [facility] work-up for assistance. I spoke with brenda this am and relayed his response. She said she had spoken with Dr. Gerrit Friends yesterday afternoon re this issue.

## 2011-08-05 ENCOUNTER — Ambulatory Visit (INDEPENDENT_AMBULATORY_CARE_PROVIDER_SITE_OTHER): Payer: Medicare Other | Admitting: Surgery

## 2011-08-05 ENCOUNTER — Encounter (INDEPENDENT_AMBULATORY_CARE_PROVIDER_SITE_OTHER): Payer: Self-pay | Admitting: Surgery

## 2011-08-05 VITALS — BP 120/78 | HR 74 | Resp 16 | Ht 62.0 in | Wt 140.0 lb

## 2011-08-05 DIAGNOSIS — C50919 Malignant neoplasm of unspecified site of unspecified female breast: Secondary | ICD-10-CM

## 2011-08-05 NOTE — Patient Instructions (Signed)
Continued drain care as instructed. Dry gauze dressings may be changed to chest wall incisions. Patient may shower.

## 2011-08-05 NOTE — Progress Notes (Signed)
Visit Diagnoses: 1. Breast cancer     HISTORY: Patient underwent bilateral mastectomy and right axillary lymph node dissection. Final pathology shows invasive ductal carcinoma measuring 1.2 cm in the right breast and ductal carcinoma in situ measuring 1.2 cm in the left breast. From the right axilla, 4 out of 4 lymph nodes were negative for metastatic disease. Patient returns today for wound check and removal of drains.   EXAM: Bilateral chest wall incisions are healing nicely. Minimal ischemia. Staples remain in place. No sign of cellulitis. No sign of seroma. Drain #3 is removed today.   IMPRESSION: #1 right breast ductal carcinoma, 1.2 cm, with negative axillary lymph nodes #2 left breast ductal carcinoma in situ   PLAN: Wound care instruction is provided. Patient will return in 3-4 days for wound check and possible further drain removal. Patient will be scheduled for followup at the cancer Center in the near future.   Velora Heckler, MD, FACS General & Endocrine Surgery [facility]

## 2011-08-05 NOTE — Op Note (Signed)
Angel [name], TRANCHINA NO.:  [id number]  MEDICAL RECORD NO.:  [id number]  LOCATION:  5154                         FACILITY:  [facility]  PHYSICIAN:  Velora Heckler, MD      DATE OF BIRTH:  November 16, 1931  DATE OF PROCEDURE:  07/29/2011                               OPERATIVE REPORT   PREOPERATIVE DIAGNOSIS:  Right breast carcinoma.  POSTOPERATIVE DIAGNOSIS:  Right breast carcinoma.  PROCEDURES: 1. Bilateral total mastectomy. 2. Right axillary lymph node dissection.  SURGEON:  Velora Heckler, MD, FACS  ASSISTANT:  Abigail Miyamoto, MD, FACS  ANESTHESIA:  General.  ESTIMATED BLOOD LOSS:  Minimal.  PREPARATION:  ChloraPrep.  COMPLICATIONS:  None.  INDICATIONS:  The patient is a 77 year old white female, referred by her primary physician with newly-diagnosed right breast carcinoma.  The patient had noted some increased nodularity in the right breast in June 2012.  She underwent screening mammograms in September 2012, which identified an abnormality in the upper right breast.  Additional views confirmed a 12-14 mm lesion in the upper breast with calcifications. Ultrasound-guided core needle biopsy was obtained and showed invasive ductal carcinoma with calcifications.  Ductal carcinoma in situ was identified.  Lymphovascular invasion was identified.  The patient was referred to general surgery.  The patient had undergone a previous right partial mastectomy and axillary lymph node biopsy in 2000 for right breast carcinoma.  After consultation with plastic surgery, the patient made a decision to proceed with bilateral mastectomy and right axillary lymph node dissection.  BODY OF REPORT:  Procedure was done in OR #3 at the [facility].  The patient was brought to the operating room, placed in a supine position on the operating room table.  Following administration of general anesthesia, the patient was positioned and then prepped and draped in  the usual strict aseptic fashion.  After ascertaining that and adequate level of anesthesia been achieved, an elliptical incision was made on the right breast so as to encompass the entire nipple areolar complex.  Skin flaps were elevated circumferentially using breast hooks.  The electrocautery was used for hemostasis.  Margins of dissection included the clavicle superiorly, the sternum medially, the rectus sheath inferiorly, and the latissimus dorsi laterally.  Breast was then reflected off of the pectoralis major muscle utilizing the electrocautery for hemostasis.  Breast was mobilized from medial to lateral taking the fascia of the pectoralis major muscle with the breast.  Dissection was carried to the edge of the pectoralis muscle.  Dissection was carried down to the chest wall.  Axilla was entered.  Remainder of the breast was excised.  Sutures were used to orient the right breast and the right breast was submitted as specimen. In the axilla, the axillary contents were dissected out.  Care was taken to preserve the thoracodorsal nerve and the long thoracic nerve. Dissection was carried to the inferior edge of the axillary vein. Axillary contents were completely excised and submitted separately to Pathology as specimen labeled right axillary contents.  Dry packs were placed in the wound on the right.  Next, we turned our attention to the left breast.  Again an elliptical incision was made with a #10  blade so as to encompass the entire nipple- areolar complex.  Skin flaps were then elevated circumferentially using breast hooks and the electrocautery.  Margins of dissection included the sternum medially, the clavicle superiorly, the latissimus dorsi laterally, and the rectus sheath inferiorly.  Breast was then reflected off the pectoralis major muscle again taking the muscle fascia with the breast.  Dissection was carried laterally to the edge of the pectoralis major muscle.  Axilla  was not dissected on the left side.  Breast was reflected off of the chest wall and completely excised.  It was oriented with sutures and submitted in its entirety to Pathology for review.  Wounds were irrigated copiously with warm saline which was evacuated. Good hemostasis was obtained throughout.  A single 19-French Blake drain was placed in the left chest wound.  It was secured to the skin with a 3- 0 nylon suture.  Skin incision was closed with stainless steel staples.  On the right chest wound, two 19-French Blake drains were brought in from stab wounds inferiorly.  They were secured to the skin with 3-0 nylon sutures.  They were cut to the appropriate length.  Skin was then reapproximated with stainless steel staples.  Xeroform gauze was used to cover the staple lines.  Dry gauze dressings were applied.  Drains were placed to bulb suction.  The patient was awakened from anesthesia and brought to the recovery room.  The patient tolerated the procedure well.   Velora Heckler, MD, FACS     TMG/MEDQ  D:  07/29/2011  T:  07/29/2011  Job:  [id number]  cc:   Lucky Cowboy, M.D. Reece Packer, M.D. Charlcie Cradle Delford Field, MD, Joycelyn Schmid, M.D.  Electronically Signed by Darnell Level MD on 08/05/2011 10:35:24 AM

## 2011-08-10 ENCOUNTER — Ambulatory Visit (INDEPENDENT_AMBULATORY_CARE_PROVIDER_SITE_OTHER): Payer: Medicare Other | Admitting: Surgery

## 2011-08-10 ENCOUNTER — Encounter (INDEPENDENT_AMBULATORY_CARE_PROVIDER_SITE_OTHER): Payer: Self-pay | Admitting: Surgery

## 2011-08-10 ENCOUNTER — Encounter (INDEPENDENT_AMBULATORY_CARE_PROVIDER_SITE_OTHER): Payer: Medicare Other | Admitting: Surgery

## 2011-08-10 VITALS — BP 136/86 | HR 72 | Temp 97.2°F | Resp 16 | Ht 62.0 in | Wt 148.6 lb

## 2011-08-10 DIAGNOSIS — C50919 Malignant neoplasm of unspecified site of unspecified female breast: Secondary | ICD-10-CM

## 2011-08-10 NOTE — Patient Instructions (Signed)
Drain care as instructed.  Call if output less than 30cc for two days.  TMG

## 2011-08-10 NOTE — Progress Notes (Signed)
Visit Diagnoses: 1. Breast cancer     HISTORY: The patient is a 77 year old white female status post bilateral mastectomy. She returns today for wound check.  EXAM: Surgical wounds are healing nicely. Staples remain in place. No sign of infection. No sign of seroma. Left chest wall drain is removed today. A single drain remains on the right. It has serous drainage of approximately 60 cc per day. It will be left in place for several more days.  IMPRESSION: Status post bilateral mastectomy and right axillary lymph node dissection  PLAN: Patient will continue to monitor the drainage from the right drain. Wound care as instructed. Patient will return in one week for drain removal and removal of staples from surgical wound.   Angel Heckler, MD, FACS General & Endocrine Surgery [facility]

## 2011-08-17 ENCOUNTER — Telehealth (INDEPENDENT_AMBULATORY_CARE_PROVIDER_SITE_OTHER): Payer: Self-pay

## 2011-08-17 ENCOUNTER — Ambulatory Visit (INDEPENDENT_AMBULATORY_CARE_PROVIDER_SITE_OTHER): Payer: Medicare Other | Admitting: Surgery

## 2011-08-17 ENCOUNTER — Encounter (INDEPENDENT_AMBULATORY_CARE_PROVIDER_SITE_OTHER): Payer: Self-pay | Admitting: Surgery

## 2011-08-17 VITALS — BP 118/78 | HR 66 | Temp 97.4°F | Resp 14 | Ht 62.0 in | Wt 148.2 lb

## 2011-08-17 DIAGNOSIS — C50919 Malignant neoplasm of unspecified site of unspecified female breast: Secondary | ICD-10-CM

## 2011-08-17 NOTE — Patient Instructions (Signed)
Drain care as instructed.  tmg

## 2011-08-17 NOTE — Progress Notes (Signed)
Visit Diagnoses: 1. Breast cancer     HISTORY: Patient returns for wound check after bilateral mastectomy  EXAM: Surgical incisions are well-healed. Every other staple will be removed today. There is a small seroma on the left. On the right a Jackson-Pratt drain remains in place. Output is serous. He continues to be greater than 60 cc per 24-hour period.  IMPRESSION: Status post bilateral mastectomy and right axillary lymph node dissection  PLAN: Every other staple was removed today. Right-sided drain will remain in place for several days yet until the output has diminished. Patient will return in one week for wound check.   Velora Heckler, MD, FACS General & Endocrine Surgery [facility]

## 2011-08-17 NOTE — Telephone Encounter (Signed)
Patient daughter called stating Angel [name] didn't feel well and was requesting to r/s her appointment for today with Dr. Gerrit Friends.  Currently, she has a drain in place that output is between 70-80 cc's.  Advised pt daughter she need's to keep her appointment with Dr. Gerrit Friends for wound & drain assessment and staple removal.

## 2011-08-25 ENCOUNTER — Encounter (INDEPENDENT_AMBULATORY_CARE_PROVIDER_SITE_OTHER): Payer: Self-pay | Admitting: Surgery

## 2011-08-25 ENCOUNTER — Ambulatory Visit (INDEPENDENT_AMBULATORY_CARE_PROVIDER_SITE_OTHER): Payer: Medicare Other | Admitting: Surgery

## 2011-08-25 VITALS — BP 128/88 | HR 64 | Temp 96.8°F | Resp 16 | Ht 62.0 in | Wt 148.6 lb

## 2011-08-25 DIAGNOSIS — C50919 Malignant neoplasm of unspecified site of unspecified female breast: Secondary | ICD-10-CM

## 2011-08-25 NOTE — Progress Notes (Signed)
Visit Diagnoses: 1. Breast cancer     HISTORY: The patient returns for postoperative wound check. She is still having approximately 60 cc of serous drainage from the right chest daily. Catheter is causing some pain.  EXAM: Surgical wounds are healing nicely. Steri-Strips remain in place. Remaining staples were removed today. Right lateral chest wall drain is also removed today.  IMPRESSION: Status post bilateral mastectomy for invasive ductal carcinoma and DCIS  PLAN: Wound care instructions are given. Patient will begin applying topical creams. She will wear a binder around the chest. She will return for wound check in 2 weeks.   Velora Heckler, MD, FACS General & Endocrine Surgery [facility]

## 2011-08-25 NOTE — Patient Instructions (Signed)
  COCOA BUTTER & VITAMIN E CREAM  (Palmer's or other brand)  Apply cocoa butter/vitamin E cream to your incision 2 - 3 times daily.  Massage cream into incision for one minute with each application.  Use sunscreen (50 SPF or higher) for first 6 months after surgery.  You may substitute Mederma or other scar reducing creams as desired.   

## 2011-09-01 ENCOUNTER — Ambulatory Visit (HOSPITAL_BASED_OUTPATIENT_CLINIC_OR_DEPARTMENT_OTHER): Payer: Medicare Other | Admitting: Oncology

## 2011-09-01 VITALS — BP 117/68 | HR 85 | Temp 98.2°F | Ht 62.0 in | Wt 150.9 lb

## 2011-09-01 DIAGNOSIS — D0590 Unspecified type of carcinoma in situ of unspecified breast: Secondary | ICD-10-CM

## 2011-09-01 DIAGNOSIS — Z170 Estrogen receptor positive status [ER+]: Secondary | ICD-10-CM

## 2011-09-01 DIAGNOSIS — C50919 Malignant neoplasm of unspecified site of unspecified female breast: Secondary | ICD-10-CM

## 2011-09-01 DIAGNOSIS — M810 Age-related osteoporosis without current pathological fracture: Secondary | ICD-10-CM

## 2011-09-01 NOTE — Patient Instructions (Signed)
Return as instructed

## 2011-09-02 NOTE — Progress Notes (Signed)
OFFICE PROGRESS NOTE INTERVAL HISTORY:   Date of Visit: Sep 01, 2011             Physicians:  T.Gerkin, W.McKeown, R.Ramos, P.Wright, T.Turner  Patient is seen, together with one daughter, in continuing attention to her second and third primary breast cancers, now having had bilateral mastectomies by Dr.Gerkin on Nov.1, 2012. The surgery was right mastectomy with 4 axillary nodes and what was planned as prophylactic left mastectomy, but with unexpected finding of DCIS in left breast (left mammogram 06-10-11 and bilateral breast MRI 06-30-11 had shown nothing of concern on left).  Pathology [phone] from [facility] 07-29-11 reports right mastectomy specimen with  1.2 cm invasive ductal carcinoma grade 2 with closest margin 0.1 cm deep, no LVSI, extensive intraductal component with clear margins, 4 axillary nodes negative, ER + 93%, PR + 35%, HER2 negative by CISH for staging pT1c, pN0. The left mastectomy specimen had 1.2 cm low grade DCIS with margins >0.2 cm which was ER + 82% and PR + 94%. Ms.Lowy has been gradually improving since discharge home, tho she still has some local discomfort in the surgical areas bilaterally, with some numbness right lateral area and upper inferior right arm consistent with the axillary evaluation.  She has been back to Dr.Gerkin and sees him again next week, understands that she may need fluid aspirated on right then. She has not begun any exercising for arms and is limiting activity. She has had no fever, no drainage from surgical incisions, no SOB/chest pain, no bladder symptoms, appetite adequate,no bleeding. She is sleeping poorly due to this discomfort/ limited positioning plus chronic back problems. She is using her usual vicodin tid from Dr.Ramos for back, did have additional pain meds at discharge from hospital which have not been refilled by surgeon due to other meds from Dr.Ramos; she has requested more pain medication from me today. She sees Dr.McKeown next in  late Jan; Dr.Ramos refills her pain meds monthly and sees her "about every 4 months".   Review of systems otherwise: no other new or different pain. No swelling LE. No constipation with pain meds. No hot flashes. No other neurologic symptoms. She is very relieved that she had the left mastectomy done also. No cough now or other symptoms of respiratory infection. She did have LS spine injection by Dr.Ramos just prior to mastectomies.  Remainder of 10 point ROS negative.   Objective:  Vital signs in last 24 hours:  BP 117/68  Pulse 85  Temp(Src) 98.2 F (36.8 C) (Oral)  Ht 5\' 2"  (1.575 m)  Wt 150 lb 14.4 oz (68.448 kg)  BMI 27.60 kg/m2  Alert, talkative, appears mildly uncomfortable, needs assistance with dressing due to limited mobility of arms, ambulatory without assistance. Daughter very supportive and present thru entirety of visit.  HEENT:mucous membranes moist, pharynx normal without lesions. PERR LymphaticsCervical, supraclavicular, and axillary nodes normal. Resp: diminished breath sounds bilaterally, clear to percussion, no wheezes or crackles Chest wall: Left mastectomy scar with steristrips, wound closed, soft fullness laterally, no erythema or particular tenderness. Right mastectomy scar also with steristrips, no drainage, some apparent fluid laterally fairly tight, some very minimal erythema medially above and below incision over 4-5 cm without heat or tenderness there. Cardio: regular rate and rhythm GI: soft, non-tender; bowel sounds normal; no masses,  no organomegaly Extremities: extremities normal, atraumatic, no cyanosis or edema UE or LE bilaterally Neuro:no sensory deficits noted except decreased light touch inferior upper right arm.    Lab Results:  BMET, other labs not repeated this visit.  CEA and CA 27.29 from 07-27-11  Normal at 1.4 and 16 respectively  Studies/Results:  See pathology report above  Bone density scan done by Dr[facility]McKeown at [facility] Sept 12, 2012 received: she has osteopenia in lumbar spine with Tscore -2.3 (improved from osteoporotic -2.7 in 2010) and is osteoporotic in femur with T score -2.7 now as compared with -2.8 in 2010. She continues calcium bid, has had Vitamen D dosed by Dr.McKeown and has been on Raloxifene for past 6-8 months (I had incorrectly thought the raloxifene had been used for a longer time).  Medications: I have reviewed the patient's current medications. I have written her a one time prescription for tramadol 50 mg 1 at hs prn pain # 30 no refills, which hopefully will allow her to rest better until she has further improvement from the recent surgery. She also requested more xanax from me, however I have asked her to call Dr.McKeown about that.  I have talked at length with patient and daughter about surgical findings as above and her continued recovery from the surgery, as well as the bone density information from standpoint of cancer treatment. With Stage 1 invasive cancer and good prognostic features in this 77 yo lady with other significant comorbidities, there would be no clear benefit to chemotherapy. She has had previous RT on right and further RT is not recommended there; she does not need RT for the left DCIS findings post mastectomy. We have discussed hormone blocking agents in adjuvant treatment, including mechanisms of action of tamoxifen and aromatase inhibitors as well as possible side effects including increase in hot flashes (minimal now), fact that she is post hysterectomy, blood clot risk especially with tamoxifen and bone density concerns with aromatase inhibitors especially given her low bone density now. Although she "did not tolerate" tamoxifen when my colleague attempted this in 2000 for right DCIS, it is not clear to patient now or from our records what the subjective concerns were. Especially given bone density, she is very willing to retry tamoxifen. She and I both feel that it is  best to allow her to improve a little further from the surgery to hopefully allow best chance at tolerating tamoxifen, so we will not begin this at least until her next visit here. Note she is still on Evista, which she wants to continue until she returns, then I would stop this while she is on tamoxifen.   Patient and daughter were comfortable with our discussion and plan, and had their questions answered to their satisfaction. I will see her back in midJanuary (Wednesdays best for daughter to accompany) for an hour visit with CBC/CMET then.   >50% of 60 min visit today spent in discussion and coordination of care.  Assessment/Plan:  1. Stage 1 (T1cN0) grade 2, ER/PR +, HER2 negative invasive ductal carcinoma of right breast in 77 yo lady now post right mastectomy Jul 30, 2011 with 4 axillary node evaluation: plan adjuvant hormonal blockade as above, anticipating trial of tamoxifen in January. 2. 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 4. Chronic orthopedic problems managed by Dr.Ramos 5.Osteoporosis by bone density scan 05/2011 6.Post hysterectomy without oophorectomy >40 yrs ago 7. Prolonged respiratory problems spring/ summer 2012 8.CAD and normal LV function by cath recently        Eron Staat P,  MD   09/02/2011, 9:40 AM

## 2011-09-03 ENCOUNTER — Other Ambulatory Visit: Payer: Self-pay

## 2011-09-03 DIAGNOSIS — C50919 Malignant neoplasm of unspecified site of unspecified female breast: Secondary | ICD-10-CM

## 2011-09-03 MED ORDER — TRAMADOL HCL 50 MG PO TABS: 50.0000 mg | ORAL_TABLET | Freq: Every evening | ORAL | Status: DC | PRN

## 2011-09-07 ENCOUNTER — Ambulatory Visit (INDEPENDENT_AMBULATORY_CARE_PROVIDER_SITE_OTHER): Payer: Medicare Other | Admitting: Surgery

## 2011-09-07 ENCOUNTER — Encounter (INDEPENDENT_AMBULATORY_CARE_PROVIDER_SITE_OTHER): Payer: Self-pay | Admitting: Surgery

## 2011-09-07 VITALS — BP 132/76 | HR 72 | Temp 97.0°F | Resp 20 | Ht 62.0 in | Wt 148.5 lb

## 2011-09-07 DIAGNOSIS — C50919 Malignant neoplasm of unspecified site of unspecified female breast: Secondary | ICD-10-CM

## 2011-09-07 MED ORDER — CIPROFLOXACIN HCL 500 MG PO TABS: 500.0000 mg | ORAL_TABLET | Freq: Two times a day (BID) | ORAL | Status: AC

## 2011-09-07 NOTE — Patient Instructions (Signed)
  COCOA BUTTER & VITAMIN E CREAM  (Palmer's or other brand)  Apply cocoa butter/vitamin E cream to your incision 2 - 3 times daily.  Massage cream into incision for one minute with each application.  You may substitute Mederma or other scar reducing creams as desired.   

## 2011-09-07 NOTE — Progress Notes (Signed)
Visit Diagnoses: 1. Breast cancer     HISTORY: Patient returns today for followup after bilateral mastectomy.  EXAM: Bilateral chest wall wounds are healing without complication. However there is a little erythema around the right chest wall incision. I do not believe this is cellulitis but it is mildly tender and mildly warm. Patient would like to try antibiotics. There is no significant stroma on either side. Remaining Steri-Strips are removed today.  IMPRESSION: Status post bilateral mastectomy, superficial cellulitis right chest wall  PLAN: I will treat the patient with Cipro 500 mg b.i.d. for 10 days. Hopefully this will clear up the erythema on the right chest wall. Using and care instructions are given. Patient will see her oncologist in early January. She will return to see me for wound check in 6 weeks.   Velora Heckler, MD, FACS General & Endocrine Surgery [facility]

## 2011-09-16 ENCOUNTER — Encounter (HOSPITAL_BASED_OUTPATIENT_CLINIC_OR_DEPARTMENT_OTHER): Payer: Self-pay | Admitting: *Deleted

## 2011-09-16 ENCOUNTER — Other Ambulatory Visit: Payer: Self-pay

## 2011-09-16 ENCOUNTER — Emergency Department (INDEPENDENT_AMBULATORY_CARE_PROVIDER_SITE_OTHER): Payer: Medicare Other

## 2011-09-16 ENCOUNTER — Emergency Department (HOSPITAL_BASED_OUTPATIENT_CLINIC_OR_DEPARTMENT_OTHER)
Admission: EM | Admit: 2011-09-16 | Discharge: 2011-09-16 | Disposition: A | Discharge status: Home / Self Care | Payer: Medicare Other | Attending: Emergency Medicine | Admitting: Emergency Medicine

## 2011-09-16 DIAGNOSIS — R059 Cough, unspecified: Secondary | ICD-10-CM

## 2011-09-16 DIAGNOSIS — R0602 Shortness of breath: Secondary | ICD-10-CM | POA: Insufficient documentation

## 2011-09-16 DIAGNOSIS — R0989 Other specified symptoms and signs involving the circulatory and respiratory systems: Secondary | ICD-10-CM

## 2011-09-16 DIAGNOSIS — J441 Chronic obstructive pulmonary disease with (acute) exacerbation: Secondary | ICD-10-CM

## 2011-09-16 DIAGNOSIS — R0789 Other chest pain: Secondary | ICD-10-CM

## 2011-09-16 DIAGNOSIS — R51 Headache: Secondary | ICD-10-CM | POA: Insufficient documentation

## 2011-09-16 DIAGNOSIS — R05 Cough: Secondary | ICD-10-CM

## 2011-09-16 DIAGNOSIS — R079 Chest pain, unspecified: Secondary | ICD-10-CM | POA: Insufficient documentation

## 2011-09-16 LAB — COMPREHENSIVE METABOLIC PANEL
ALT: 11 U/L (ref 0–35)
AST: 17 U/L (ref 0–37)
CO2: 25 mEq/L (ref 19–32)
Chloride: 104 mEq/L (ref 96–112)
Creatinine, Ser: 0.7 mg/dL (ref 0.50–1.10)
GFR calc non Af Amer: 81 mL/min — ABNORMAL LOW (ref >90)
Glucose, Bld: 98 mg/dL (ref 70–99)
Total Bilirubin: 0.2 mg/dL — ABNORMAL LOW (ref 0.3–1.2)

## 2011-09-16 LAB — CBC
HCT: 39 % (ref 36.0–46.0)
Hemoglobin: 13.1 g/dL (ref 12.0–15.0)
RBC: 4.3 MIL/uL (ref 3.87–5.11)
RDW: 12.8 % (ref 11.5–15.5)
WBC: 4.5 10*3/uL (ref 4.0–10.5)

## 2011-09-16 LAB — DIFFERENTIAL
Basophils Absolute: 0 10*3/uL (ref 0.0–0.1)
Lymphocytes Relative: 27 % (ref 12–46)
Lymphs Abs: 1.2 10*3/uL (ref 0.7–4.0)
Monocytes Absolute: 0.4 10*3/uL (ref 0.1–1.0)
Neutro Abs: 2.9 10*3/uL (ref 1.7–7.7)

## 2011-09-16 MED ORDER — SODIUM CHLORIDE 0.9 % IV BOLUS (SEPSIS)
500.0000 mL | Freq: Once | INTRAVENOUS | Status: AC
  Administered 2011-09-16: 500 mL via INTRAVENOUS

## 2011-09-16 MED ORDER — METHYLPREDNISOLONE SODIUM SUCC 125 MG IJ SOLR
125.0000 mg | Freq: Once | INTRAMUSCULAR | Status: AC
  Administered 2011-09-16: 125 mg via INTRAVENOUS
  Filled 2011-09-16: qty 2

## 2011-09-16 MED ORDER — ALBUTEROL SULFATE (5 MG/ML) 0.5% IN NEBU
5.0000 mg | INHALATION_SOLUTION | Freq: Once | RESPIRATORY_TRACT | Status: AC
  Administered 2011-09-16: 5 mg via RESPIRATORY_TRACT
  Filled 2011-09-16: qty 1

## 2011-09-16 MED ORDER — PREDNISONE 10 MG PO TABS: 20.0000 mg | ORAL_TABLET | Freq: Every day | ORAL | Status: DC

## 2011-09-16 MED ORDER — ACETAMINOPHEN 325 MG PO TABS
650.0000 mg | ORAL_TABLET | Freq: Once | ORAL | Status: AC
  Administered 2011-09-16: 650 mg via ORAL
  Filled 2011-09-16: qty 2

## 2011-09-16 MED ORDER — IPRATROPIUM BROMIDE 0.02 % IN SOLN
0.5000 mg | Freq: Once | RESPIRATORY_TRACT | Status: AC
  Administered 2011-09-16: 0.5 mg via RESPIRATORY_TRACT
  Filled 2011-09-16: qty 2.5

## 2011-09-16 MED ORDER — POTASSIUM CHLORIDE CRYS ER 20 MEQ PO TBCR
20.0000 meq | EXTENDED_RELEASE_TABLET | Freq: Once | ORAL | Status: AC
  Administered 2011-09-16: 20 meq via ORAL
  Filled 2011-09-16: qty 1

## 2011-09-16 MED ORDER — LEVOFLOXACIN 500 MG PO TABS: 500.0000 mg | ORAL_TABLET | Freq: Every day | ORAL | Status: AC

## 2011-09-16 NOTE — ED Notes (Signed)
Pt now reports chest "tightness", increasing with cough but present at all times, increasing with activity. Triage acuity adjusted, ekg performed, iv and labs obtained while pt being triaged.

## 2011-09-16 NOTE — ED Provider Notes (Signed)
History     CSN: [id number]  Arrival date & time 09/16/11  0913   First MD Initiated Contact with Patient 09/16/11 1000      Chief Complaint  Patient presents with  . Facial Pain  . Cough  . Nasal Congestion  . Shortness of Breath    (Consider location/radiation/quality/duration/timing/severity/associated sxs/prior treatment) HPI  Patient with cough, nasal and chest congestion began yesterday, with headache, no fever or chills. Cough productive of yellow phlegm and sob.  Patient states she could not find proair until this a.m.  And used one time this a.m.  Denies hospitalization for copd.    Patient had bilateral mastectomy 07/29/11 with history of breast cancer diagnosed 16 years ago and had radiation.  Currently awaiting treatment plan with Dr. Darrold Span mid January.  PMD is Dr. Vaughan Basta.  Patient with history of COPD.  Patient on cipro for a question of infection at right incision site for one week.    Past Medical History  Diagnosis Date  . Asthma   . Hyperlipemia   . Spinal stenosis   . DDD (degenerative disc disease)   . Osteoporosis   . Osteoarthritis   . Kidney stones   . Breast cancer     bilateral    Past Surgical History  Procedure Date  . Vesicovaginal fistula closure w/ tah age 30  . Tubal ligation 1961  . Appendectomy   . Breast lumpectomy     right breast  . Small intestine surgery   . Mastectomy 07/29/11    bilateral  . Cardiac surgery     Family History  Problem Relation Age of Onset  . Emphysema Mother   . Asthma Brother   . Asthma Daughter   . Asthma Grandchild   . Asthma Brother     deceased  . Heart disease Mother   . Heart disease Father   . Heart disease Brother   . Pancreatic cancer Mother   . Lymphoma Brother     History  Substance Use Topics  . Smoking status: Never Smoker   . Smokeless tobacco: Never Used  . Alcohol Use: No    OB History    Grav Para Term Preterm Abortions TAB SAB Ect Mult Living                   Review of Systems  All other systems reviewed and are negative.    Allergies  Penicillins  Home Medications   Current Outpatient Rx  Name Route Sig Dispense Refill  . BECLOMETHASONE DIPROPIONATE 80 MCG/ACT IN AERS Inhalation Inhale 2 puffs into the lungs 2 (two) times daily. 1 Inhaler 6  . CALCIUM-MAGNESIUM-ZINC 1000-400-15 MG PO TABS Oral Take 1 tablet by mouth daily.      . CELEBREX 200 MG PO CAPS Oral Take 1 capsule by mouth Daily.    [name] VITAMIN D-3 5000 UNITS PO TABS Oral Take 1 tablet by mouth daily.     [name] CIPROFLOXACIN HCL 500 MG PO TABS Oral Take 1 tablet (500 mg total) by mouth 2 (two) times daily. 20 tablet 0  . CITALOPRAM HYDROBROMIDE 40 MG PO TABS Oral Take 40 mg by mouth daily. Patient taking 1 1/2 tablet per dosage for total of 60 mg.    . CLORAZEPATE DIPOTASSIUM 7.5 MG PO TABS Oral Take 7.5 mg by mouth 3 (three) times daily.     . CO Q-10 PO Oral Take 1 tablet by mouth daily.     [name] ESOMEPRAZOLE MAGNESIUM 40  MG PO CPDR Oral Take 40 mg by mouth daily before breakfast.      . EVISTA 60 MG PO TABS Oral Take 60 mg by mouth daily. Once daily    . EZETIMIBE 10 MG PO TABS Oral Take 10 mg by mouth daily.      [name] FLAX SEED OIL PO Oral Take 5 mLs by mouth daily. 1 tsp daily    . HYDROCODONE-ACETAMINOPHEN 10-325 MG PO TABS Oral Take 1 tablet by mouth every 6 (six) hours as needed. For pain    . MONTELUKAST SODIUM 10 MG PO TABS Oral Take 1 tablet (10 mg total) by mouth at bedtime. 90 tablet 4    Generic please  . THERA M PLUS PO TABS Oral Take 1 tablet by mouth daily.      [name] NASONEX 50 MCG/ACT NA SUSP Nasal 2 sprays by Nasal route Daily.    [name] PROAIR HFA 108 (90 BASE) MCG/ACT IN AERS Inhalation Inhale 2 puffs into the lungs every 4 (four) hours as needed. For shortness of breath      BP 135/74  Pulse 92  Temp(Src) 98.1 F (36.7 C) (Oral)  Resp 18  Ht 5\' 2"  (1.575 m)  Wt 143 lb (64.864 kg)  BMI 26.15 kg/m2  SpO2 98%  Physical Exam  Nursing note and vitals  reviewed. Constitutional: She is oriented to person, place, and time. She appears well-developed and well-nourished.  HENT:  Head: Normocephalic and atraumatic.  Eyes: Conjunctivae are normal. Pupils are equal, round, and reactive to light.  Neck: Normal range of motion. Neck supple.  Cardiovascular: Normal rate, regular rhythm, normal heart sounds and intact distal pulses.   Pulmonary/Chest: Effort normal.       Crackles left base, incisions healing well   Abdominal: Soft. Bowel sounds are normal.  Musculoskeletal: Normal range of motion.  Neurological: She is alert and oriented to person, place, and time. She has normal reflexes.  Skin: Skin is warm and dry.  Psychiatric: She has a normal mood and affect. Her behavior is normal. Thought content normal.    ED Course  Procedures (including critical care time)  Labs Reviewed - No data to display No results found.   No diagnosis found.    MDM   Results for orders placed during the hospital encounter of 09/16/11  CBC      Component Value Range   WBC 4.5  4.0 - 10.5 (K/uL)   RBC 4.30  3.87 - 5.11 (MIL/uL)   Hemoglobin 13.1  12.0 - 15.0 (g/dL)   HCT 16.1  09.6 - 04.5 (%)   MCV 90.7  78.0 - 100.0 (fL)   MCH 30.5  26.0 - 34.0 (pg)   MCHC 33.6  30.0 - 36.0 (g/dL)   RDW 40.9  81.1 - 91.4 (%)   Platelets 216  150 - 400 (K/uL)  DIFFERENTIAL      Component Value Range   Neutrophils Relative 64  43 - 77 (%)   Neutro Abs 2.9  1.7 - 7.7 (K/uL)   Lymphocytes Relative 27  12 - 46 (%)   Lymphs Abs 1.2  0.7 - 4.0 (K/uL)   Monocytes Relative 9  3 - 12 (%)   Monocytes Absolute 0.4  0.1 - 1.0 (K/uL)   Eosinophils Relative 0  0 - 5 (%)   Eosinophils Absolute 0.0  0.0 - 0.7 (K/uL)   Basophils Relative 0  0 - 1 (%)   Basophils Absolute 0.0  0.0 - 0.1 (K/uL)  COMPREHENSIVE METABOLIC PANEL      Component Value Range   Sodium 138  135 - 145 (mEq/L)   Potassium 3.3 (*) 3.5 - 5.1 (mEq/L)   Chloride 104  96 - 112 (mEq/L)   CO2 25  19 - 32  (mEq/L)   Glucose, Bld 98  70 - 99 (mg/dL)   BUN 10  6 - 23 (mg/dL)   Creatinine, Ser 9.52  0.50 - 1.10 (mg/dL)   Calcium 8.9  8.4 - 84.1 (mg/dL)   Total Protein 6.4  6.0 - 8.3 (g/dL)   Albumin 3.5  3.5 - 5.2 (g/dL)   AST 17  0 - 37 (U/L)   ALT 11  0 - 35 (U/L)   Alkaline Phosphatase 55  39 - 117 (U/L)   Total Bilirubin 0.2 (*) 0.3 - 1.2 (mg/dL)   GFR calc non Af Amer 81 (*) >90 (mL/min)   GFR calc Af Amer >90  >90 (mL/min)  TROPONIN I      Component Value Range   Troponin I <0.30  <0.30 (ng/mL)   Dg Chest 2 View  09/16/2011  *RADIOLOGY REPORT*  Clinical Data: Cough, congestion, chest tightness and shortness of breath  CHEST - 2 VIEW  Comparison: Chest x-Afifa Truax of 06/09/2011  Findings: The lungs remain clear.  Slight elevation of the right hemidiaphragm is stable.  The heart is within normal limits in size.  Mediastinal contours are stable.  No bony abnormality is seen.  IMPRESSION:   Stable chest x-Daisi Kentner.  No active lung disease.  Original Report Authenticated By: Juline Patch, M.D.    Patient feels improved after breathing treatment. She has a history of COPD and has had change in color her cough. She has been on Cipro for possible wound infection. She will be changed to Levaquin. She'll be placed on prednisone. She is encouraged to have someone with her over the next several days. She is to recheck at her primary care Dr. tomorrow. She is mildly hypokalemic and has had an oral replenishment here.    Hilario Quarry, MD 09/16/11 [phone]

## 2011-09-16 NOTE — ED Notes (Signed)
Pt amb to room 5 with quick steady gait in nad. Pt reports 4 days of cough producing yellow, thick sputum, congestion, sinus pressure.

## 2011-09-18 ENCOUNTER — Emergency Department (HOSPITAL_BASED_OUTPATIENT_CLINIC_OR_DEPARTMENT_OTHER)
Admission: EM | Admit: 2011-09-18 | Discharge: 2011-09-18 | Disposition: A | Discharge status: Home / Self Care | Payer: Medicare Other | Attending: Emergency Medicine | Admitting: Emergency Medicine

## 2011-09-18 ENCOUNTER — Encounter (HOSPITAL_BASED_OUTPATIENT_CLINIC_OR_DEPARTMENT_OTHER): Payer: Self-pay | Admitting: Emergency Medicine

## 2011-09-18 ENCOUNTER — Emergency Department (INDEPENDENT_AMBULATORY_CARE_PROVIDER_SITE_OTHER): Payer: Medicare Other

## 2011-09-18 DIAGNOSIS — Z853 Personal history of malignant neoplasm of breast: Secondary | ICD-10-CM | POA: Insufficient documentation

## 2011-09-18 DIAGNOSIS — R112 Nausea with vomiting, unspecified: Secondary | ICD-10-CM

## 2011-09-18 DIAGNOSIS — M810 Age-related osteoporosis without current pathological fracture: Secondary | ICD-10-CM | POA: Insufficient documentation

## 2011-09-18 DIAGNOSIS — R05 Cough: Secondary | ICD-10-CM

## 2011-09-18 DIAGNOSIS — IMO0002 Reserved for concepts with insufficient information to code with codable children: Secondary | ICD-10-CM | POA: Insufficient documentation

## 2011-09-18 DIAGNOSIS — R509 Fever, unspecified: Secondary | ICD-10-CM

## 2011-09-18 DIAGNOSIS — J111 Influenza due to unidentified influenza virus with other respiratory manifestations: Secondary | ICD-10-CM | POA: Insufficient documentation

## 2011-09-18 DIAGNOSIS — J45909 Unspecified asthma, uncomplicated: Secondary | ICD-10-CM | POA: Insufficient documentation

## 2011-09-18 DIAGNOSIS — E785 Hyperlipidemia, unspecified: Secondary | ICD-10-CM | POA: Insufficient documentation

## 2011-09-18 LAB — BASIC METABOLIC PANEL
BUN: 10 mg/dL (ref 6–23)
Calcium: 8.9 mg/dL (ref 8.4–10.5)
GFR calc non Af Amer: 81 mL/min — ABNORMAL LOW (ref >90)
Glucose, Bld: 99 mg/dL (ref 70–99)
Sodium: 142 mEq/L (ref 135–145)

## 2011-09-18 LAB — CBC
MCH: 30.1 pg (ref 26.0–34.0)
MCHC: 32.9 g/dL (ref 30.0–36.0)
Platelets: 224 10*3/uL (ref 150–400)

## 2011-09-18 MED ORDER — HYDROCODONE-ACETAMINOPHEN 5-325 MG PO TABS: 1.0000 | ORAL_TABLET | Freq: Four times a day (QID) | ORAL | Status: AC | PRN

## 2011-09-18 MED ORDER — SODIUM CHLORIDE 0.9 % IV BOLUS (SEPSIS)
250.0000 mL | Freq: Once | INTRAVENOUS | Status: AC
  Administered 2011-09-18: 08:00:00 via INTRAVENOUS

## 2011-09-18 MED ORDER — PREDNISONE 10 MG PO TABS: 20.0000 mg | ORAL_TABLET | Freq: Every day | ORAL | Status: DC

## 2011-09-18 MED ORDER — ALBUTEROL SULFATE (5 MG/ML) 0.5% IN NEBU
2.5000 mg | INHALATION_SOLUTION | Freq: Once | RESPIRATORY_TRACT | Status: AC
  Administered 2011-09-18: 2.5 mg via RESPIRATORY_TRACT
  Filled 2011-09-18: qty 0.5

## 2011-09-18 MED ORDER — OSELTAMIVIR PHOSPHATE 75 MG PO CAPS: 75.0000 mg | ORAL_CAPSULE | Freq: Two times a day (BID) | ORAL | Status: AC

## 2011-09-18 MED ORDER — SODIUM CHLORIDE 0.9 % IV SOLN: INTRAVENOUS | Status: DC

## 2011-09-18 MED ORDER — ACETAMINOPHEN 160 MG/5ML PO SOLN
ORAL | Status: AC
  Administered 2011-09-18: 08:00:00
  Filled 2011-09-18: qty 20.3

## 2011-09-18 MED ORDER — IPRATROPIUM BROMIDE 0.02 % IN SOLN
0.5000 mg | Freq: Once | RESPIRATORY_TRACT | Status: AC
  Administered 2011-09-18: 0.5 mg via RESPIRATORY_TRACT
  Filled 2011-09-18: qty 2.5

## 2011-09-18 NOTE — ED Provider Notes (Signed)
History     CSN: [id number]  Arrival date & time 09/18/11  4098   First MD Initiated Contact with Patient 09/18/11 [phone]      Chief Complaint  Patient presents with  . Cough    cough with recent hx of Viral infection/URI non prouductive    (Consider location/radiation/quality/duration/timing/severity/associated sxs/prior treatment) Patient is a 77 y.o. female presenting with cough. The history is provided by the patient, a relative and the EMS personnel.  Cough This is a new problem. The current episode started more than 2 days ago. The problem occurs constantly. The cough is productive of sputum. The maximum temperature recorded prior to her arrival was 101 to 101.9 F. The fever has been present for less than 1 day. Associated symptoms include chills, headaches, myalgias and shortness of breath. Pertinent negatives include no chest pain, no sweats, no ear congestion, no ear pain, no sore throat, no wheezing and no eye redness. Her past medical history is significant for COPD and asthma.   Patient was seen in the emergency department on December 20 with diagnosis upper respiratory infection. Today with onset of headache bodyaches fever productive cough worse congestion. Patient did have the influenza immunization.   Patient has a history of bilateral mastectomies in November that was the beginning of November. Not undergoing chemotherapy. Patient has a history of bronchitis COPD. Patient has a local primary care provider and is followed by pulmonology.  Past Medical History  Diagnosis Date  . Asthma   . Hyperlipemia   . Spinal stenosis   . DDD (degenerative disc disease)   . Osteoporosis   . Osteoarthritis   . Kidney stones   . Breast cancer     bilateral    Past Surgical History  Procedure Date  . Vesicovaginal fistula closure w/ tah age 54  . Tubal ligation 1961  . Appendectomy   . Breast lumpectomy     right breast  . Small intestine surgery   . Mastectomy 07/29/11   bilateral  . Cardiac surgery     Family History  Problem Relation Age of Onset  . Emphysema Mother   . Asthma Brother   . Asthma Daughter   . Asthma Grandchild   . Asthma Brother     deceased  . Heart disease Mother   . Heart disease Father   . Heart disease Brother   . Pancreatic cancer Mother   . Lymphoma Brother     History  Substance Use Topics  . Smoking status: Never Smoker   . Smokeless tobacco: Never Used  . Alcohol Use: No    OB History    Grav Para Term Preterm Abortions TAB SAB Ect Mult Living                  Review of Systems  Constitutional: Positive for fever and chills.  HENT: Positive for congestion. Negative for ear pain, sore throat and neck pain.   Eyes: Negative for redness.  Respiratory: Positive for cough and shortness of breath. Negative for wheezing.   Cardiovascular: Negative for chest pain.  Gastrointestinal: Positive for vomiting. Negative for nausea, abdominal pain and diarrhea.  Genitourinary: Negative for dysuria.  Musculoskeletal: Positive for myalgias and back pain.  Skin: Negative for rash.  Neurological: Positive for headaches.  Psychiatric/Behavioral: Negative for confusion.    Allergies  Penicillins  Home Medications   Current Outpatient Rx  Name Route Sig Dispense Refill  . BECLOMETHASONE DIPROPIONATE 80 MCG/ACT IN AERS Inhalation Inhale 2 puffs  into the lungs 2 (two) times daily. 1 Inhaler 6  . CALCIUM-MAGNESIUM-ZINC 1000-400-15 MG PO TABS Oral Take 1 tablet by mouth daily.      . CELEBREX 200 MG PO CAPS Oral Take 1 capsule by mouth Daily.    [name] VITAMIN D-3 5000 UNITS PO TABS Oral Take 1 tablet by mouth daily.     [name] CIPROFLOXACIN HCL 500 MG PO TABS Oral Take 1 tablet (500 mg total) by mouth 2 (two) times daily. 20 tablet 0  . CITALOPRAM HYDROBROMIDE 40 MG PO TABS Oral Take 40 mg by mouth daily. Patient taking 1 1/2 tablet per dosage for total of 60 mg.    . CLORAZEPATE DIPOTASSIUM 7.5 MG PO TABS Oral Take 7.5 mg by mouth  3 (three) times daily.     . CO Q-10 PO Oral Take 1 tablet by mouth daily.     [name] ESOMEPRAZOLE MAGNESIUM 40 MG PO CPDR Oral Take 40 mg by mouth daily before breakfast.      . EVISTA 60 MG PO TABS Oral Take 60 mg by mouth daily. Once daily    . EZETIMIBE 10 MG PO TABS Oral Take 10 mg by mouth daily.      [name] FLAX SEED OIL PO Oral Take 5 mLs by mouth daily. 1 tsp daily    . HYDROCODONE-ACETAMINOPHEN 10-325 MG PO TABS Oral Take 1 tablet by mouth every 6 (six) hours as needed. For pain    . HYDROCODONE-ACETAMINOPHEN 5-325 MG PO TABS Oral Take 1-2 tablets by mouth every 6 (six) hours as needed for pain. 10 tablet 0  . LEVOFLOXACIN 500 MG PO TABS Oral Take 1 tablet (500 mg total) by mouth daily. 7 tablet 0  . MONTELUKAST SODIUM 10 MG PO TABS Oral Take 1 tablet (10 mg total) by mouth at bedtime. 90 tablet 4    Generic please  . THERA M PLUS PO TABS Oral Take 1 tablet by mouth daily.      [name] NASONEX 50 MCG/ACT NA SUSP Nasal 2 sprays by Nasal route Daily.    . OSELTAMIVIR PHOSPHATE 75 MG PO CAPS Oral Take 1 capsule (75 mg total) by mouth 2 (two) times daily. 10 capsule 0  . PREDNISONE 10 MG PO TABS Oral Take 2 tablets (20 mg total) by mouth daily. 15 tablet 0  . PREDNISONE 10 MG PO TABS Oral Take 2 tablets (20 mg total) by mouth daily. 10 tablet 0  . PROAIR HFA 108 (90 BASE) MCG/ACT IN AERS Inhalation Inhale 2 puffs into the lungs every 4 (four) hours as needed. For shortness of breath      BP 149/67  Pulse 99  Temp(Src) 99.4 F (37.4 C) (Oral)  Resp 20  SpO2 98%  Physical Exam  Nursing note and vitals reviewed. Constitutional: She is oriented to person, place, and time. She appears well-developed and well-nourished.  HENT:  Head: Normocephalic and atraumatic.  Mouth/Throat: Oropharynx is clear and moist.  Eyes: Conjunctivae and EOM are normal. Pupils are equal, round, and reactive to light.  Neck: Normal range of motion. Neck supple.  Cardiovascular: Normal rate, regular rhythm and normal heart  sounds.   No murmur heard. Pulmonary/Chest: Effort normal and breath sounds normal. No respiratory distress. She has no wheezes.  Abdominal: Soft. Bowel sounds are normal. There is no tenderness.  Musculoskeletal: Normal range of motion. She exhibits no edema.  Lymphadenopathy:    She has no cervical adenopathy.  Neurological: She is alert and oriented to person, place,  and time. No cranial nerve deficit. She exhibits normal muscle tone. Coordination normal.  Skin: Skin is warm and dry. No rash noted.    ED Course  Procedures (including critical care time)  Labs Reviewed  BASIC METABOLIC PANEL - Abnormal; Notable for the following:    GFR calc non Af Amer 81 (*)    All other components within normal limits  CBC   Dg Chest 2 View  09/18/2011  *RADIOLOGY REPORT*  Clinical Data: Cough, fever, nausea and vomiting  CHEST - 2 VIEW  Comparison: Chest x-ray of 09/16/2011  Findings: No active infiltrate or effusion is seen.  The right hemidiaphragm remains slightly elevated.  The heart is mildly enlarged and stable.  There are degenerative changes in the thoracic spine.  IMPRESSION: No active lung disease.  Stable slight elevation of the right hemidiaphragm.  Original Report Authenticated By: Juline Patch, M.D.   Dg Chest 2 View  09/16/2011  *RADIOLOGY REPORT*  Clinical Data: Cough, congestion, chest tightness and shortness of breath  CHEST - 2 VIEW  Comparison: Chest x-ray of 06/09/2011  Findings: The lungs remain clear.  Slight elevation of the right hemidiaphragm is stable.  The heart is within normal limits in size.  Mediastinal contours are stable.  No bony abnormality is seen.  IMPRESSION:   Stable chest x-ray.  No active lung disease.  Original Report Authenticated By: Juline Patch, M.D.   Results for orders placed during the hospital encounter of 09/18/11  BASIC METABOLIC PANEL      Component Value Range   Sodium 142  135 - 145 (mEq/L)   Potassium 3.7  3.5 - 5.1 (mEq/L)   Chloride  105  96 - 112 (mEq/L)   CO2 28  19 - 32 (mEq/L)   Glucose, Bld 99  70 - 99 (mg/dL)   BUN 10  6 - 23 (mg/dL)   Creatinine, Ser 1.61  0.50 - 1.10 (mg/dL)   Calcium 8.9  8.4 - 09.6 (mg/dL)   GFR calc non Af Amer 81 (*) >90 (mL/min)   GFR calc Af Amer >90  >90 (mL/min)  CBC      Component Value Range   WBC 5.6  4.0 - 10.5 (K/uL)   RBC 4.38  3.87 - 5.11 (MIL/uL)   Hemoglobin 13.2  12.0 - 15.0 (g/dL)   HCT 04.5  40.9 - 81.1 (%)   MCV 91.6  78.0 - 100.0 (fL)   MCH 30.1  26.0 - 34.0 (pg)   MCHC 32.9  30.0 - 36.0 (g/dL)   RDW 91.4  78.2 - 95.6 (%)   Platelets 224  150 - 400 (K/uL)     1. Influenza       MDM   She is seen 3 days ago with upper respiratory infection however today's symptoms are more consistent with true influenza headache bodyaches fever coughing congestion is worse with will give a trial of Tamiflu because these new symptoms may be the true onset of influenza and therefore Tamiflu may be helpful. Chest x-ray negative for pneumonia, no wheezing in the emergency department, fever improved. Oxygen saturation is normal no tachycardia. We'll discharge patient home with precautions. Will start on Tamiflu prednisone and Norco for bodyaches.       Shelda Jakes, MD 09/18/11 [phone]

## 2011-09-18 NOTE — ED Notes (Signed)
Care plan reviewed with Pt and family L arm Cleared for use by MD for hydration therapy

## 2011-09-18 NOTE — ED Notes (Signed)
Pt reports worsening symptoms with dxn of URI arrived no acute distress

## 2011-09-18 NOTE — ED Notes (Signed)
Re plan hydration and safe use of Meds reviewed with pt and family

## 2011-10-13 ENCOUNTER — Ambulatory Visit (HOSPITAL_BASED_OUTPATIENT_CLINIC_OR_DEPARTMENT_OTHER): Payer: Medicare Other | Admitting: Oncology

## 2011-10-13 ENCOUNTER — Other Ambulatory Visit: Payer: Medicare Other | Admitting: Lab

## 2011-10-13 VITALS — BP 133/68 | HR 97 | Temp 97.0°F | Ht 62.0 in | Wt 150.3 lb

## 2011-10-13 DIAGNOSIS — M810 Age-related osteoporosis without current pathological fracture: Secondary | ICD-10-CM

## 2011-10-13 DIAGNOSIS — C50919 Malignant neoplasm of unspecified site of unspecified female breast: Secondary | ICD-10-CM

## 2011-10-13 DIAGNOSIS — D0590 Unspecified type of carcinoma in situ of unspecified breast: Secondary | ICD-10-CM

## 2011-10-13 LAB — COMPREHENSIVE METABOLIC PANEL
ALT: 11 U/L (ref 0–35)
Alkaline Phosphatase: 36 U/L — ABNORMAL LOW (ref 39–117)
Sodium: 136 mEq/L (ref 135–145)
Total Bilirubin: 0.4 mg/dL (ref 0.3–1.2)
Total Protein: 5.9 g/dL — ABNORMAL LOW (ref 6.0–8.3)

## 2011-10-13 LAB — CBC WITH DIFFERENTIAL/PLATELET
BASO%: 0.6 % (ref 0.0–2.0)
Eosinophils Absolute: 0 10*3/uL (ref 0.0–0.5)
LYMPH%: 41.8 % (ref 14.0–49.7)
MCHC: 33.6 g/dL (ref 31.5–36.0)
MCV: 90.8 fL (ref 79.5–101.0)
MONO%: 8.5 % (ref 0.0–14.0)
Platelets: 210 10*3/uL (ref 145–400)
RBC: 4.19 10*6/uL (ref 3.70–5.45)

## 2011-10-17 NOTE — Progress Notes (Signed)
OFFICE PROGRESS NOTE Date of Visit  Oct 13, 2011 Physicians: W.McKeown, T.Gerkin, R.Ramos, P.Wright, T.Turner  INTERVAL HISTORY:   Patient is seen, together with one daughter, in continuing attention to her recently diagnosed bilateral breast cancers, also history of DCIS right breast in 2000. She had right mastectomy with 4 axillary nodes examined, and what was planned as prophylactic left mastectomy but with unexpected finding of DCIS on the left. The right carcinoma was 1.2 cm invasive ductal, grade 2, 4 nodes negative, ER/PR positive and HER2 negative; the left DCIS was also ER/PR positive. She had RT with lumpectomy for the right DCIS in 2000.   Patient has felt much better in past couple of weeks, tho she was treated for superficial cellulitis at mastectomy site with oral antibiotics and had flu-like illness with bronchitis since she was here last in early Dec. She was seen in ED for the respiratory illness, CXR not remarkable; she did have flu vaccine. The cellulitis and respiratory symptoms have resolved. She is now back at her own home and is going about most regular activities. She is to see Dr.Gerkin later this month.  Review of Systems: no fever or other symptoms of infection. No significant discomfort at mastectomy sites. Better ROM LUE. No new or different pain. Good appetite. No LE edema. No hot flashes. Remainder of 10 point ROS negative/ unchanged.  Objective:  Vital signs in last 24 hours:  BP 133/68  Pulse 97  Temp(Src) 97 F (36.1 C) (Oral)  Ht 5\' 2"  (1.575 m)  Wt 150 lb 4.8 oz (68.176 kg)  BMI 27.49 kg/m2  Quickly and easily mobile about the office, looks much brighter and more comfortable than when I had seen her last.   HEENT:mucous membranes moist, pharynx normal without lesions. PERRL. Normal hair pattern LymphaticsCervical, supraclavicular, and axillary nodes normal. Resp: clear to auscultation bilaterally and normal percussion bilaterally Cardio: regular rate  and rhythm GI: soft, non-tender; bowel sounds normal; no masses,  no organomegaly Extremities: extremities normal, atraumatic, no cyanosis or edema Left mastectomy scar entirely closed, no erythema or heat. Right mastectomy scar likewise healed, probably some fairly   Minimal fluid laterally.Nothing in either axilla, improved ROM left shoulder, no swelling LUE     Lab Results: CBC WBC 4.7, ANC 2.3, Hgb 12.8, plt 210k  BMET CMET: AP 36, Tprot 5.9 otherwise full CMET WNL  Studies/Results:  Report of bone density scan done at [facility] 06-09-11 scanned in [place]: osteoporotic with T spine -2.3 and femur -2.8  Medications: I have reviewed the patient's current medications.  We have reviewed pathology information and our plan to begin tamoxifen, as she is doing very well now from surgery and as she may be able to tolerate this now even tho there was some unspecified reason that this was stopped after brief time in 2000. We will begin tamoxifen at 10 mg qhs for first couple of weeks, then increase to total 20 mg daily if she tolerates. She will stop Raloxifene when she begins tamoxifen. She and daughter understand that we can change therapy to an aromatase inhibitor if needed, tho her documented osteoporosis would be of more concern on an AI. I have reviewed possible side effects of tamoxifen including increase in hot flashes, worsening of lipids, blood clots and rarely depression  (she is post hysterectomy). They are comfortable with this plan and have had questions answered to their satisfaction.  Assessment/Plan:  1. Stage 1 left breast cancer in 78 yo lady, features as above,  post mastectomy with 4 axillary node evaluation: begin tamoxifen 10 mg qhs. I will see her back in 4 - 6 weeks (Wed best for daughter to accompany) or sooner if needed. 2. DCIS right breast, found unexpectedly at prophylactic right mastectomy. 3. Hx DCIS right breast 2000, treated with lumpectomy and RT, and  briefly on tamoxifen. 4.osteoporosis 5. CAD 6. Prolonged respiratory problems spring and summer 2012        Reece Packer, MD   10/17/2011, 7:15 PM

## 2011-10-19 ENCOUNTER — Other Ambulatory Visit: Payer: Self-pay

## 2011-10-19 DIAGNOSIS — C50919 Malignant neoplasm of unspecified site of unspecified female breast: Secondary | ICD-10-CM

## 2011-10-19 MED ORDER — TAMOXIFEN CITRATE 10 MG PO TABS: ORAL_TABLET | ORAL | Status: DC

## 2011-10-21 ENCOUNTER — Encounter (INDEPENDENT_AMBULATORY_CARE_PROVIDER_SITE_OTHER): Payer: Medicare Other | Admitting: Surgery

## 2011-11-02 ENCOUNTER — Telehealth: Payer: Self-pay

## 2011-11-02 NOTE — Telephone Encounter (Signed)
FAXED SIGNED ORDERS DATED 10-29-11 FOR MASTECTOMY PRODUCTS.  SENT A COPY TO MEDICAL RECORDS TO BE SCANNED INTO PT.'S EMR.

## 2011-11-10 ENCOUNTER — Encounter (INDEPENDENT_AMBULATORY_CARE_PROVIDER_SITE_OTHER): Payer: Self-pay | Admitting: Surgery

## 2011-11-10 ENCOUNTER — Ambulatory Visit (INDEPENDENT_AMBULATORY_CARE_PROVIDER_SITE_OTHER): Payer: Medicare Other | Admitting: Surgery

## 2011-11-10 VITALS — BP 116/64 | HR 60 | Temp 99.0°F | Resp 18 | Ht 61.5 in | Wt 147.4 lb

## 2011-11-10 DIAGNOSIS — C50919 Malignant neoplasm of unspecified site of unspecified female breast: Secondary | ICD-10-CM

## 2011-11-10 NOTE — Progress Notes (Signed)
Visit Diagnoses: 1. Breast cancer     HISTORY: Patient is now over 3 months out from bilateral mastectomy. She is being followed by her medical oncologist and is taking tamoxifen. She returns today for wound check.  PERTINENT REVIEW OF SYSTEMS: The patient notes some soreness with physical activity along the chest wall. No drainage from the incisions. No sign of recurrence of seroma.  EXAM: HEENT: normocephalic; pupils equal and reactive; sclerae clear; dentition good; mucous membranes moist NECK:  symmetric on extension; no palpable anterior or posterior cervical lymphadenopathy; no supraclavicular masses; no tenderness CHEST: clear to auscultation bilaterally without rales, rhonchi, or wheezes;  Incisions are well healed bilaterally. No sign of cellulitis. No sign of seroma. No palpable masses. No lymphadenopathy. CARDIAC: regular rate and rhythm without significant murmur; peripheral pulses are full EXT:  non-tender without edema; no deformity NEURO: no gross focal deficits; no sign of tremor   IMPRESSION: Status post bilateral mastectomy, no sign of recurrent disease  PLAN: Patient will continue followup with her primary care physician and with her medical oncologist. She will return to see me for follow up from her breast cancer in 6 months.  Velora Heckler, MD, FACS General & Endocrine Surgery [facility]

## 2011-11-10 NOTE — Patient Instructions (Signed)
  COCOA BUTTER & VITAMIN E CREAM  ([employer] or other brand)  Apply cocoa butter/vitamin E cream to your incision 2 - 3 times daily.  Massage cream into incision for one minute with each application.  Use sunscreen (50 SPF or higher) for first 6 months after surgery if area is exposed to sun.  You may substitute Mederma or other scar reducing creams as desired.   

## 2011-11-17 ENCOUNTER — Other Ambulatory Visit: Payer: Self-pay | Admitting: *Deleted

## 2011-11-17 DIAGNOSIS — C50919 Malignant neoplasm of unspecified site of unspecified female breast: Secondary | ICD-10-CM

## 2011-11-17 MED ORDER — TAMOXIFEN CITRATE 10 MG PO TABS: ORAL_TABLET | ORAL | Status: DC

## 2011-11-24 ENCOUNTER — Other Ambulatory Visit: Payer: Medicare Other | Admitting: Lab

## 2011-11-24 ENCOUNTER — Ambulatory Visit: Payer: Medicare Other | Admitting: Oncology

## 2011-11-25 ENCOUNTER — Telehealth: Payer: Self-pay

## 2011-11-25 NOTE — Telephone Encounter (Signed)
SPOKE WITH MS. Angel [name] ABOUT MISSING  APPT. YESTERDAY.  SHE SAID THAT SHE WAS NOT AWARE  OF APPT. SHE DID NOT TOLERATE THE TAMOXIFEN.  SHE WAS STAGGERING AROUND , HAD BLURRED VISION, JUST NOT FUNCTIONING VERY WELL.  SHE STOPPED THE TAMOXIFEN SAT. 11-20-11.  SHE IS DOING MUCH BETTER NOW. OFFERED APPT. TO SEE DR. Darrold Span ON 12-20-11.  PT. DECLINED.  SHE WANTS TO WAIT TO SEE DR. Darrold Span.  SHE HAS A STEROID INJ ON 12-01-11 AND HER PHYSICAL IN THE MIDDLE OF MARCH.  SHE SAID SHE WILL CALL BACK TO MAKE AN APPT. ONCE SHE HAS THESE FEW APPTS. BEHIND HER. TOLD HER THAT THIS INFORMATION WILL BE GIVEN TO DR. Darrold Span. REFILL FOR TAMOXIFEN GIVEN FROM OUR OFFICE ON 11-17-11 PER EMR.  MS. Angel [name] DID NOT PICK UP THE MEDICATION.

## 2011-11-30 ENCOUNTER — Telehealth: Payer: Self-pay

## 2011-11-30 NOTE — Telephone Encounter (Signed)
TOLD Angel [name] THAT DR. Darrold Span WOULD PREFER IF SHE WOULD SCHEDULE AND APPT. FOR AT LEAST April AT THIS TIME AS IT IS DIFFICULT TO GET APPTS. ON SHORT NOTICE.  DR. Darrold Span NEEDS TO SEE HER SINCE SHE HAD PROBLEMS TOLERATING  THE TAMOXIFEN. Angel [name] AGREED TO SET AN APPT. UP FOR January 05, 2012 AT 1130 LAB AND 1200 VISIT.   PT. VERBALIZED UNDERSTANDING.

## 2012-01-03 ENCOUNTER — Encounter: Payer: Self-pay | Admitting: Specialist

## 2012-01-03 NOTE — Progress Notes (Signed)
Ms. Rape called and left a message that she needed to arrange counseling services.  When I returned the call, she said she had made an appointment to see Dr. Enzo Bi, clinical psychologist at the [facility].  She indicated she is dealing with multiple issues, including a recent double mastectomy, the death of her daughter, and a grandson with serious problems.  I affirmed her for making the appointment to care for herself and offered to be of any help should she need my services.

## 2012-01-04 ENCOUNTER — Ambulatory Visit: Payer: Medicare Other | Admitting: Psychiatry

## 2012-01-05 ENCOUNTER — Other Ambulatory Visit: Payer: Medicare Other | Admitting: Lab

## 2012-01-05 ENCOUNTER — Ambulatory Visit: Payer: Medicare Other | Admitting: Oncology

## 2012-01-06 ENCOUNTER — Other Ambulatory Visit: Payer: Self-pay | Admitting: Oncology

## 2012-01-07 ENCOUNTER — Telehealth: Payer: Self-pay | Admitting: Oncology

## 2012-01-07 NOTE — Telephone Encounter (Signed)
called pt to r/s missed appt and she stated that her daughter died so once se getts things together she will rtn call to r/s appt

## 2012-02-11 ENCOUNTER — Other Ambulatory Visit: Payer: Self-pay | Admitting: Critical Care Medicine

## 2012-05-10 ENCOUNTER — Other Ambulatory Visit: Payer: Self-pay | Admitting: Critical Care Medicine

## 2012-05-10 MED ORDER — BECLOMETHASONE DIPROPIONATE 80 MCG/ACT IN AERS: 2.0000 | INHALATION_SPRAY | Freq: Two times a day (BID) | RESPIRATORY_TRACT | Status: DC

## 2012-05-10 NOTE — Telephone Encounter (Signed)
Faxed refill request received from [facility] for Qvar, inhale 2 puffs twice daily. Patient last seen 06/10/11. Refill sent in and noted that patient needs appointment for further refills.

## 2012-06-22 ENCOUNTER — Ambulatory Visit: Payer: 59 | Admitting: Critical Care Medicine

## 2012-07-20 ENCOUNTER — Ambulatory Visit: Payer: 59 | Admitting: Critical Care Medicine

## 2012-08-01 ENCOUNTER — Other Ambulatory Visit: Payer: Self-pay | Admitting: Critical Care Medicine

## 2012-11-07 ENCOUNTER — Other Ambulatory Visit: Payer: Self-pay | Admitting: *Deleted

## 2012-11-07 MED ORDER — MONTELUKAST SODIUM 10 MG PO TABS: ORAL_TABLET | ORAL | Status: DC

## 2012-11-07 NOTE — Telephone Encounter (Signed)
Received faxed refill request from [facility] for singulair 10 mg qhs.  Pt was last seen by Dr. Delford Field on 02/2011 and has no pending appt.  Called, spoke with pt.  Pt states she isn't around her calender right now but will call back to schedule OV with PW once she gets to her calender.  I did give pt office #.  She is aware singulair rx will be sent to pharm but she will need OV with PW for additional rxs.  She verbalized understanding and voiced no further questions or concerns at this time.

## 2013-02-01 ENCOUNTER — Other Ambulatory Visit: Payer: Self-pay | Admitting: Critical Care Medicine

## 2013-02-05 ENCOUNTER — Other Ambulatory Visit: Payer: Self-pay | Admitting: Internal Medicine

## 2013-02-12 ENCOUNTER — Other Ambulatory Visit: Payer: Self-pay | Admitting: Critical Care Medicine

## 2013-02-13 NOTE — Telephone Encounter (Signed)
Pt was last seen by PW on 06/10/2011. She has no pending OVs at this time. Per refill request on 11/07/12, pt was given rx as she was to call to schedule an OV with PW. No OV ever scheduled. Called, spoke with pt.  Pt states she is working on things right now.  States she's had a lot going on over the past 1-2 yrs.  I offered pt to make an OV today so I can send refills to last until this appt and advised she would have to keep this appt for further refills but pt states she can't schedule OV at this time.  I explained to pt that it is per [place] Law pts see providers at least once yearly for rxs and orders.  She verbalized understanding of this but still couldn't schedule OV at this time.  Pt states she will call back at the beginning of next wk to schedule OV.

## 2013-02-15 NOTE — Telephone Encounter (Signed)
Spoke with PW. Pt will need to come in for OV for refills or will need to get this from her PCP. Will deny rx.  Pt will need to come in for OV for further refills.

## 2013-08-08 ENCOUNTER — Other Ambulatory Visit: Payer: Self-pay | Admitting: Emergency Medicine

## 2013-08-08 ENCOUNTER — Telehealth: Payer: Self-pay | Admitting: *Deleted

## 2013-08-08 MED ORDER — MOMETASONE FUROATE 50 MCG/ACT NA SUSP: 2.0000 | Freq: Every day | NASAL | Status: DC

## 2013-08-08 MED ORDER — BECLOMETHASONE DIPROPIONATE 80 MCG/ACT IN AERS: 2.0000 | INHALATION_SPRAY | Freq: Two times a day (BID) | RESPIRATORY_TRACT | Status: DC

## 2013-08-08 MED ORDER — ALBUTEROL SULFATE HFA 108 (90 BASE) MCG/ACT IN AERS: 2.0000 | INHALATION_SPRAY | RESPIRATORY_TRACT | Status: DC | PRN

## 2013-08-08 NOTE — Telephone Encounter (Signed)
RX = NEW PHARM :  Pt's DOB  [birth year] REFILLS :  PROAIR NASONEX QUVAR  * chart being sent back*

## 2013-08-22 ENCOUNTER — Other Ambulatory Visit: Payer: Self-pay | Admitting: Internal Medicine

## 2013-08-22 DIAGNOSIS — H35371 Puckering of macula, right eye: Secondary | ICD-10-CM | POA: Insufficient documentation

## 2013-08-22 DIAGNOSIS — H35341 Macular cyst, hole, or pseudohole, right eye: Secondary | ICD-10-CM | POA: Insufficient documentation

## 2013-08-27 ENCOUNTER — Other Ambulatory Visit: Payer: Self-pay | Admitting: Physician Assistant

## 2013-09-09 ENCOUNTER — Encounter: Payer: Self-pay | Admitting: Internal Medicine

## 2013-09-09 DIAGNOSIS — M5136 Other intervertebral disc degeneration, lumbar region: Secondary | ICD-10-CM | POA: Insufficient documentation

## 2013-09-09 DIAGNOSIS — N200 Calculus of kidney: Secondary | ICD-10-CM | POA: Insufficient documentation

## 2013-09-10 ENCOUNTER — Ambulatory Visit (INDEPENDENT_AMBULATORY_CARE_PROVIDER_SITE_OTHER): Payer: Medicare Other | Admitting: Internal Medicine

## 2013-09-10 ENCOUNTER — Encounter: Payer: Self-pay | Admitting: Internal Medicine

## 2013-09-10 VITALS — BP 118/72 | HR 68 | Temp 98.4°F | Resp 18

## 2013-09-10 DIAGNOSIS — I10 Essential (primary) hypertension: Secondary | ICD-10-CM

## 2013-09-10 DIAGNOSIS — J441 Chronic obstructive pulmonary disease with (acute) exacerbation: Secondary | ICD-10-CM

## 2013-09-10 DIAGNOSIS — E559 Vitamin D deficiency, unspecified: Secondary | ICD-10-CM

## 2013-09-10 DIAGNOSIS — M940 Chondrocostal junction syndrome [Tietze]: Secondary | ICD-10-CM

## 2013-09-10 DIAGNOSIS — E782 Mixed hyperlipidemia: Secondary | ICD-10-CM | POA: Insufficient documentation

## 2013-09-10 DIAGNOSIS — K2100 Gastro-esophageal reflux disease with esophagitis, without bleeding: Secondary | ICD-10-CM | POA: Insufficient documentation

## 2013-09-10 MED ORDER — PREDNISONE 20 MG PO TABS: ORAL_TABLET | ORAL | Status: DC

## 2013-09-10 NOTE — Progress Notes (Signed)
Subjective:     Patient ID: Angel [name], female   DOB: Mar 12, 1932, 79 y.o.   MRN: [id number]  HPI   Review of Systems  Constitutional: Negative.   HENT: Negative.   Eyes: Negative.   Respiratory: Positive for chest tightness. Negative for choking, shortness of breath and wheezing.   Cardiovascular: Positive for chest pain. Negative for palpitations and leg swelling.       Non exertional parasternal shest wall soreness.  Gastrointestinal: Negative.   Endocrine: Negative.   Genitourinary: Negative.   Allergic/Immunologic: Negative.   Neurological: Negative.   Hematological: Negative.   Psychiatric/Behavioral: Negative.       Patient c/o'd of tenderness in anterior chest - para sternal area Objective:   Physical Exam  Constitutional: She is oriented to person, place, and time. She appears well-nourished.  HENT:  Head: Atraumatic.  Right Ear: External ear normal.  Left Ear: External ear normal.  Eyes: Conjunctivae and EOM are normal. Pupils are equal, round, and reactive to light.  Neck: Normal range of motion. Neck supple. No JVD present.  Cardiovascular: Normal rate, regular rhythm and normal heart sounds.   No murmur heard. Pulmonary/Chest: No stridor. No respiratory distress. She has no wheezes. She has no rales. She exhibits tenderness.  Abdominal: Soft. Bowel sounds are normal. She exhibits no distension. There is no tenderness. There is no rebound and no guarding.  Musculoskeletal: Normal range of motion. She exhibits no edema and no tenderness.  Lymphadenopathy:    She has no cervical adenopathy.  Neurological: She is alert and oriented to person, place, and time. No cranial nerve deficit. Coordination normal.  Skin: Skin is warm and dry. No rash noted. No erythema.       Assessment:     1. Costochondritis    Plan:        Recommended empiric trial of prednisone 20 mg - to try 1/2 to 1 tab 2- 3 x daily for several days

## 2013-09-25 ENCOUNTER — Other Ambulatory Visit: Payer: Self-pay | Admitting: Physician Assistant

## 2013-09-25 MED ORDER — DIAZEPAM 5 MG PO TABS: ORAL_TABLET | ORAL | Status: DC

## 2013-10-25 ENCOUNTER — Encounter: Payer: Self-pay | Admitting: Internal Medicine

## 2013-11-01 ENCOUNTER — Other Ambulatory Visit: Payer: Self-pay | Admitting: Physician Assistant

## 2013-11-05 ENCOUNTER — Other Ambulatory Visit: Payer: Self-pay | Admitting: Physician Assistant

## 2013-11-09 ENCOUNTER — Other Ambulatory Visit: Payer: Self-pay | Admitting: Physician Assistant

## 2013-11-09 MED ORDER — RALOXIFENE HCL 60 MG PO TABS: 60.0000 mg | ORAL_TABLET | Freq: Every day | ORAL | Status: DC

## 2013-11-09 MED ORDER — EZETIMIBE 10 MG PO TABS: 10.0000 mg | ORAL_TABLET | Freq: Every day | ORAL | Status: DC

## 2013-11-09 MED ORDER — CELECOXIB 200 MG PO CAPS: 200.0000 mg | ORAL_CAPSULE | Freq: Every day | ORAL | Status: DC

## 2013-12-04 ENCOUNTER — Inpatient Hospital Stay (HOSPITAL_BASED_OUTPATIENT_CLINIC_OR_DEPARTMENT_OTHER)
Admission: EM | Admit: 2013-12-04 | Discharge: 2013-12-06 | DRG: 287 | Disposition: A | Discharge status: Home / Self Care | Payer: Medicare Other | Attending: Internal Medicine | Admitting: Internal Medicine

## 2013-12-04 ENCOUNTER — Emergency Department (HOSPITAL_BASED_OUTPATIENT_CLINIC_OR_DEPARTMENT_OTHER): Payer: Medicare Other

## 2013-12-04 ENCOUNTER — Encounter (HOSPITAL_BASED_OUTPATIENT_CLINIC_OR_DEPARTMENT_OTHER): Payer: Self-pay | Admitting: Emergency Medicine

## 2013-12-04 DIAGNOSIS — I2510 Atherosclerotic heart disease of native coronary artery without angina pectoris: Principal | ICD-10-CM | POA: Diagnosis present

## 2013-12-04 DIAGNOSIS — Z9010 Acquired absence of unspecified breast and nipple: Secondary | ICD-10-CM

## 2013-12-04 DIAGNOSIS — Z87442 Personal history of urinary calculi: Secondary | ICD-10-CM

## 2013-12-04 DIAGNOSIS — J42 Unspecified chronic bronchitis: Secondary | ICD-10-CM | POA: Diagnosis present

## 2013-12-04 DIAGNOSIS — Z85828 Personal history of other malignant neoplasm of skin: Secondary | ICD-10-CM

## 2013-12-04 DIAGNOSIS — M810 Age-related osteoporosis without current pathological fracture: Secondary | ICD-10-CM | POA: Diagnosis present

## 2013-12-04 DIAGNOSIS — E785 Hyperlipidemia, unspecified: Secondary | ICD-10-CM | POA: Diagnosis present

## 2013-12-04 DIAGNOSIS — I200 Unstable angina: Secondary | ICD-10-CM | POA: Diagnosis present

## 2013-12-04 DIAGNOSIS — I4949 Other premature depolarization: Secondary | ICD-10-CM | POA: Diagnosis not present

## 2013-12-04 DIAGNOSIS — Z807 Family history of other malignant neoplasms of lymphoid, hematopoietic and related tissues: Secondary | ICD-10-CM

## 2013-12-04 DIAGNOSIS — Z880 Allergy status to penicillin: Secondary | ICD-10-CM

## 2013-12-04 DIAGNOSIS — Z9089 Acquired absence of other organs: Secondary | ICD-10-CM

## 2013-12-04 DIAGNOSIS — I493 Ventricular premature depolarization: Secondary | ICD-10-CM

## 2013-12-04 DIAGNOSIS — I10 Essential (primary) hypertension: Secondary | ICD-10-CM

## 2013-12-04 DIAGNOSIS — J45909 Unspecified asthma, uncomplicated: Secondary | ICD-10-CM | POA: Diagnosis present

## 2013-12-04 DIAGNOSIS — M1990 Unspecified osteoarthritis, unspecified site: Secondary | ICD-10-CM

## 2013-12-04 DIAGNOSIS — Z825 Family history of asthma and other chronic lower respiratory diseases: Secondary | ICD-10-CM

## 2013-12-04 DIAGNOSIS — Z9851 Tubal ligation status: Secondary | ICD-10-CM

## 2013-12-04 DIAGNOSIS — Z79899 Other long term (current) drug therapy: Secondary | ICD-10-CM

## 2013-12-04 DIAGNOSIS — Z8249 Family history of ischemic heart disease and other diseases of the circulatory system: Secondary | ICD-10-CM

## 2013-12-04 DIAGNOSIS — Z853 Personal history of malignant neoplasm of breast: Secondary | ICD-10-CM

## 2013-12-04 DIAGNOSIS — Z800 Family history of malignant neoplasm of digestive organs: Secondary | ICD-10-CM

## 2013-12-04 DIAGNOSIS — Z836 Family history of other diseases of the respiratory system: Secondary | ICD-10-CM

## 2013-12-04 DIAGNOSIS — M549 Dorsalgia, unspecified: Secondary | ICD-10-CM | POA: Diagnosis present

## 2013-12-04 DIAGNOSIS — Z7982 Long term (current) use of aspirin: Secondary | ICD-10-CM

## 2013-12-04 DIAGNOSIS — G8929 Other chronic pain: Secondary | ICD-10-CM | POA: Diagnosis present

## 2013-12-04 LAB — COMPREHENSIVE METABOLIC PANEL
ALT: 14 U/L (ref 0–35)
AST: 20 U/L (ref 0–37)
Albumin: 3.7 g/dL (ref 3.5–5.2)
Alkaline Phosphatase: 50 U/L (ref 39–117)
BUN: 15 mg/dL (ref 6–23)
CALCIUM: 9.4 mg/dL (ref 8.4–10.5)
CO2: 28 mEq/L (ref 19–32)
Chloride: 98 mEq/L (ref 96–112)
Creatinine, Ser: 0.8 mg/dL (ref 0.50–1.10)
GFR calc non Af Amer: 68 mL/min — ABNORMAL LOW (ref >90)
GFR, EST AFRICAN AMERICAN: 79 mL/min — AB (ref >90)
Glucose, Bld: 91 mg/dL (ref 70–99)
Potassium: 3.8 mEq/L (ref 3.7–5.3)
SODIUM: 138 meq/L (ref 137–147)
TOTAL PROTEIN: 6.6 g/dL (ref 6.0–8.3)
Total Bilirubin: 0.2 mg/dL — ABNORMAL LOW (ref 0.3–1.2)

## 2013-12-04 LAB — CBC WITH DIFFERENTIAL/PLATELET
BASOS ABS: 0 10*3/uL (ref 0.0–0.1)
Basophils Relative: 0 % (ref 0–1)
EOS ABS: 0.1 10*3/uL (ref 0.0–0.7)
Eosinophils Relative: 1 % (ref 0–5)
HCT: 40.7 % (ref 36.0–46.0)
Hemoglobin: 13.5 g/dL (ref 12.0–15.0)
LYMPHS PCT: 33 % (ref 12–46)
Lymphs Abs: 2.8 10*3/uL (ref 0.7–4.0)
MCH: 30.6 pg (ref 26.0–34.0)
MCHC: 33.2 g/dL (ref 30.0–36.0)
MCV: 92.3 fL (ref 78.0–100.0)
Monocytes Absolute: 0.7 10*3/uL (ref 0.1–1.0)
Monocytes Relative: 8 % (ref 3–12)
Neutro Abs: 5.1 10*3/uL (ref 1.7–7.7)
Neutrophils Relative %: 59 % (ref 43–77)
PLATELETS: 208 10*3/uL (ref 150–400)
RBC: 4.41 MIL/uL (ref 3.87–5.11)
RDW: 12.6 % (ref 11.5–15.5)
WBC: 8.7 10*3/uL (ref 4.0–10.5)

## 2013-12-04 LAB — TROPONIN I: Troponin I: <0.3 ng/mL (ref <0.30)

## 2013-12-04 LAB — CBG MONITORING, ED: Glucose-Capillary: 89 mg/dL (ref 70–99)

## 2013-12-04 MED ORDER — HEPARIN BOLUS VIA INFUSION
60.0000 [IU]/kg | Freq: Once | INTRAVENOUS | Status: AC
  Administered 2013-12-04: 3864 [IU] via INTRAVENOUS

## 2013-12-04 MED ORDER — CITALOPRAM HYDROBROMIDE 20 MG PO TABS
20.0000 mg | ORAL_TABLET | Freq: Every day | ORAL | Status: DC
Start: 2013-12-05 — End: 2013-12-04

## 2013-12-04 MED ORDER — SODIUM CHLORIDE 0.9 % IJ SOLN: 3.0000 mL | Freq: Two times a day (BID) | INTRAMUSCULAR | Status: DC

## 2013-12-04 MED ORDER — HEPARIN SODIUM (PORCINE) 5000 UNIT/ML IJ SOLN: 5000.0000 [IU] | Freq: Three times a day (TID) | INTRAMUSCULAR | Status: DC

## 2013-12-04 MED ORDER — SODIUM CHLORIDE 0.9 % IJ SOLN: 3.0000 mL | INTRAMUSCULAR | Status: DC | PRN

## 2013-12-04 MED ORDER — MONTELUKAST SODIUM 10 MG PO TABS
10.0000 mg | ORAL_TABLET | Freq: Every day | ORAL | Status: DC
  Administered 2013-12-05: 10 mg via ORAL
  Filled 2013-12-04 (×2): qty 1

## 2013-12-04 MED ORDER — CLORAZEPATE DIPOTASSIUM 3.75 MG PO TABS
7.5000 mg | ORAL_TABLET | Freq: Three times a day (TID) | ORAL | Status: DC
  Administered 2013-12-04 – 2013-12-06 (×5): 7.5 mg via ORAL
  Filled 2013-12-04 (×5): qty 2

## 2013-12-04 MED ORDER — ONDANSETRON HCL 4 MG/2ML IJ SOLN
4.0000 mg | Freq: Once | INTRAMUSCULAR | Status: AC
  Administered 2013-12-04: 4 mg via INTRAVENOUS
  Filled 2013-12-04: qty 2

## 2013-12-04 MED ORDER — NITROGLYCERIN 0.4 MG SL SUBL: 0.4000 mg | SUBLINGUAL_TABLET | SUBLINGUAL | Status: DC | PRN

## 2013-12-04 MED ORDER — HYDROCODONE-ACETAMINOPHEN 5-325 MG PO TABS
2.0000 | ORAL_TABLET | Freq: Once | ORAL | Status: AC
Start: 2013-12-04 — End: 2013-12-04
  Administered 2013-12-04: 2 via ORAL
  Filled 2013-12-04: qty 2

## 2013-12-04 MED ORDER — SODIUM CHLORIDE 0.9 % IV SOLN
250.0000 mL | INTRAVENOUS | Status: DC | PRN
  Administered 2013-12-04: 250 mL via INTRAVENOUS

## 2013-12-04 MED ORDER — DULOXETINE HCL 60 MG PO CPEP
60.0000 mg | ORAL_CAPSULE | Freq: Every day | ORAL | Status: DC
  Administered 2013-12-05 – 2013-12-06 (×2): 60 mg via ORAL
  Filled 2013-12-04 (×2): qty 1

## 2013-12-04 MED ORDER — EZETIMIBE 10 MG PO TABS
10.0000 mg | ORAL_TABLET | Freq: Every day | ORAL | Status: DC
  Administered 2013-12-05 – 2013-12-06 (×2): 10 mg via ORAL
  Filled 2013-12-04 (×2): qty 1

## 2013-12-04 MED ORDER — METOPROLOL TARTRATE 1 MG/ML IV SOLN
5.0000 mg | Freq: Once | INTRAVENOUS | Status: AC
  Administered 2013-12-04: 5 mg via INTRAVENOUS
  Filled 2013-12-04: qty 5

## 2013-12-04 MED ORDER — RALOXIFENE HCL 60 MG PO TABS
60.0000 mg | ORAL_TABLET | Freq: Every day | ORAL | Status: DC
  Administered 2013-12-05 – 2013-12-06 (×2): 60 mg via ORAL
  Filled 2013-12-04 (×2): qty 1

## 2013-12-04 MED ORDER — ATORVASTATIN CALCIUM 80 MG PO TABS
80.0000 mg | ORAL_TABLET | Freq: Every day | ORAL | Status: DC
  Administered 2013-12-04: 80 mg via ORAL
  Filled 2013-12-04 (×3): qty 1

## 2013-12-04 MED ORDER — HEPARIN (PORCINE) IN NACL 100-0.45 UNIT/ML-% IJ SOLN
800.0000 [IU]/h | INTRAMUSCULAR | Status: DC
  Administered 2013-12-04: 800 [IU]/h via INTRAVENOUS
  Filled 2013-12-04 (×2): qty 250

## 2013-12-04 MED ORDER — ASPIRIN EC 325 MG PO TBEC
325.0000 mg | DELAYED_RELEASE_TABLET | Freq: Every day | ORAL | Status: DC
  Administered 2013-12-05 – 2013-12-06 (×2): 325 mg via ORAL
  Filled 2013-12-04 (×2): qty 1

## 2013-12-04 MED ORDER — HEPARIN BOLUS VIA INFUSION
3000.0000 [IU] | Freq: Once | INTRAVENOUS | Status: AC
  Administered 2013-12-04: 3000 [IU] via INTRAVENOUS
  Filled 2013-12-04: qty 3000

## 2013-12-04 MED ORDER — ASPIRIN 325 MG PO TABS
325.0000 mg | ORAL_TABLET | Freq: Once | ORAL | Status: AC
Start: 2013-12-04 — End: 2013-12-04
  Administered 2013-12-04: 325 mg via ORAL
  Filled 2013-12-04: qty 1

## 2013-12-04 MED ORDER — HEPARIN SODIUM (PORCINE) 5000 UNIT/ML IJ SOLN
INTRAMUSCULAR | Status: AC
  Filled 2013-12-04: qty 1

## 2013-12-04 MED ORDER — PANTOPRAZOLE SODIUM 40 MG PO TBEC
40.0000 mg | DELAYED_RELEASE_TABLET | Freq: Every day | ORAL | Status: DC
  Administered 2013-12-05 – 2013-12-06 (×2): 40 mg via ORAL
  Filled 2013-12-04 (×2): qty 1

## 2013-12-04 NOTE — ED Notes (Signed)
Pt adamant that she must use restroom prior to exam room therefore ekg delayed slilghtly

## 2013-12-04 NOTE — ED Notes (Signed)
Report given to [place] rn at [facility]

## 2013-12-04 NOTE — ED Notes (Signed)
Pt states was in a store and began having a pressure feeling in center of chest with radiation down middle of back also became dizzy and felt short of breath. States it hit her all of a sudden and had pretty much subsided now. Pt also reports that she had this happened last week also while she was moving some boxes of old papers when same thing happened suddenly.

## 2013-12-04 NOTE — ED Provider Notes (Signed)
CSN: [id number]     Arrival date & time 12/04/13  1612 History   First MD Initiated Contact with Patient 12/04/13 1619     Chief Complaint  Patient presents with  . Chest Pain     (Consider location/radiation/quality/duration/timing/severity/associated sxs/prior Treatment) Patient is a 80 y.o. female presenting with chest pain. The history is provided by the patient.  Chest Pain Pain location:  L chest Pain quality: pressure   Pain radiates to:  L arm and upper back Pain radiates to the back: yes   Pain severity:  Moderate Onset quality:  Sudden Duration:  30 minutes Timing:  Constant Progression:  Resolved Chronicity:  Recurrent Context comment:  Shopping Relieved by:  Rest Worsened by:  Nothing tried Ineffective treatments:  None tried Associated symptoms: nausea and shortness of breath   Associated symptoms: no abdominal pain, no cough, no diaphoresis, no fever, no near-syncope, no syncope and not vomiting   Risk factors: not female and no Marfan's syndrome     Past Medical History  Diagnosis Date  . Asthma   . Hyperlipemia   . Spinal stenosis   . Osteoporosis   . Chronic bronchitis   . Kidney stones   . DDD (degenerative disc disease)   . Osteoarthritis   . Degenerative joint disease   . Breast cancer     bilateral  . Skin cancer of face    Past Surgical History  Procedure Laterality Date  . Vesicovaginal fistula closure w/ tah  age 6  . Tubal ligation  1961  . Appendectomy    . Breast lumpectomy      right breast  . Mastectomy  07/29/11    bilateral  . Cardiac surgery    . Abdominal hysterectomy    . Small intestine surgery      SBO  . Tonsillectomy     Family History  Problem Relation Age of Onset  . Emphysema Mother   . Heart disease Mother   . Pancreatic cancer Mother   . Cancer Mother     pancreatic  . Asthma Brother   . Asthma Daughter   . Asthma Grandchild   . Asthma Brother     deceased  . Heart disease Father   . Heart disease  Brother   . Lymphoma Brother    History  Substance Use Topics  . Smoking status: Never Smoker   . Smokeless tobacco: Never Used  . Alcohol Use: No   OB History   Grav Para Term Preterm Abortions TAB SAB Ect Mult Living                 Review of Systems  Constitutional: Negative for fever and diaphoresis.  Respiratory: Positive for shortness of breath. Negative for cough.   Cardiovascular: Positive for chest pain. Negative for syncope and near-syncope.  Gastrointestinal: Positive for nausea. Negative for vomiting and abdominal pain.  All other systems reviewed and are negative.      Allergies  Flax seed oil and Penicillins  Home Medications   Current Outpatient Rx  Name  Route  Sig  Dispense  Refill  . albuterol (PROAIR HFA) 108 (90 BASE) MCG/ACT inhaler   Inhalation   Inhale 2 puffs into the lungs every 4 (four) hours as needed. For shortness of breath   1 Inhaler   12   . ALPRAZolam (XANAX) 0.5 MG tablet      Ad lib.         . beclomethasone (QVAR) 80 MCG/ACT inhaler  Inhalation   Inhale 2 puffs into the lungs 2 (two) times daily.   1 Inhaler   0     Needs appointment for further refills   . Calcium-Magnesium-Zinc 1000-400-15 MG TABS   Oral   Take 1 tablet by mouth daily.           . celecoxib (CELEBREX) 200 MG capsule   Oral   Take 1 capsule (200 mg total) by mouth daily.   90 capsule   1   . Cholecalciferol (VITAMIN D-3) 5000 UNITS TABS   Oral   Take 1 tablet by mouth daily.          . citalopram (CELEXA) 20 MG tablet      Take 1 tablet by mouth  every day for mood   90 tablet   1   . citalopram (CELEXA) 40 MG tablet   Oral   Take 40 mg by mouth daily. Patient taking 1 1/2 tablet per dosage for total of 60 mg.         . clorazepate (TRANXENE) 7.5 MG tablet   Oral   Take 7.5 mg by mouth 3 (three) times daily.          . Coenzyme Q10 (CO Q-10 PO)   Oral   Take 1 tablet by mouth daily.          . diazepam (VALIUM) 5 MG  tablet      TAKE ONE TABLET BY MOUTH THREE TIMES DAILY AS NEEDED   90 tablet   0   . DULoxetine (CYMBALTA) 60 MG capsule      Take 1 capsule by mouth  every day for mood   90 capsule   1   . esomeprazole (NEXIUM) 40 MG capsule   Oral   Take 40 mg by mouth daily before breakfast.           . ezetimibe (ZETIA) 10 MG tablet   Oral   Take 1 tablet (10 mg total) by mouth daily.   90 tablet   1   . Flaxseed, Linseed, (FLAX SEED OIL PO)   Oral   Take 5 mLs by mouth daily. 1 tsp daily         . HYDROcodone-acetaminophen (NORCO) 10-325 MG per tablet   Oral   Take 1 tablet by mouth every 6 (six) hours as needed. For pain         . mometasone (NASONEX) 50 MCG/ACT nasal spray   Nasal   Place 2 sprays into the nose daily.   17 g   12   . montelukast (SINGULAIR) 10 MG tablet      TAKE ONE TABLET BY MOUTH AT BEDTIME   90 tablet   0   . Multiple Vitamins-Minerals (MULTIVITAMINS THER. W/MINERALS) TABS   Oral   Take 1 tablet by mouth daily.           . predniSONE (DELTASONE) 20 MG tablet      1 tab 3 x day for 2 days, then 1 tab 2 x day for 2 days, then 1 tab 1 x day for 3 days   30 tablet   0     1/2 to 1 tablet 3 x daily for inflammation   . raloxifene (EVISTA) 60 MG tablet   Oral   Take 1 tablet (60 mg total) by mouth daily.   90 tablet   1   . SYMBICORT 160-4.5 MCG/ACT inhaler               .  tamoxifen (NOLVADEX) 10 MG tablet      TAKE 2 TABLETS BY MOUTH DAILY   60 tablet   0    BP 126/79  Pulse 72  Temp(Src) 97.8 F (36.6 C) (Oral)  Resp 18  Ht 5\' 2"  (1.575 m)  Wt 142 lb (64.411 kg)  BMI 25.97 kg/m2  SpO2 100% Physical Exam  Nursing note and vitals reviewed. Constitutional: She is oriented to person, place, and time. She appears well-developed and well-nourished. No distress.  HENT:  Head: Normocephalic and atraumatic.  Eyes: EOM are normal. Pupils are equal, round, and reactive to light.  Neck: Normal range of motion. Neck supple.   Cardiovascular: Normal rate and regular rhythm.  Exam reveals no friction rub.   No murmur heard. Pulmonary/Chest: Effort normal and breath sounds normal. No respiratory distress. She has no wheezes. She has no rales.  Abdominal: Soft. She exhibits no distension. There is no tenderness. There is no rebound.  Musculoskeletal: Normal range of motion. She exhibits no edema.  Neurological: She is alert and oriented to person, place, and time.  Skin: She is not diaphoretic.    ED Course  Procedures (including critical care time) Labs Review Labs Reviewed  TROPONIN I  CBC WITH DIFFERENTIAL  COMPREHENSIVE METABOLIC PANEL   Imaging Review No results found.   EKG Interpretation None      Date: 12/04/2013  Rate: 75  Rhythm: normal sinus rhythm  QRS Axis: normal  Intervals: normal  ST/T Wave abnormalities: normal  Conduction Disutrbances:none  Narrative Interpretation:   Old EKG Reviewed: unchanged    MDM   Final diagnoses:  Unstable angina    85F here with chest pain. Rapid onset of L sided chest heaviness with radiation the left arm associated shortness of breath and nausea. Lasted 30 minutes, relieved with rest. Had a similar episode a week ago which was longer and also had near syncope with deficit. Another consumer 3 weeks ago. Had a heart cath 5 years ago by Dr. Radford Pax without intervention taken. No hx of DM, HTN, prior CAD requiring intervention. EKG here with PVCs, otherwise normal. No ischemic changes. Concern for possible ACS. Aspirin given, labs sent. Labs normal. Dr. Haroldine Laws consulted, will accept patient. He recommended lopressor and aspirin at this time, both ordered and given. No further CP.    Osvaldo Shipper, MD 12/04/13 [phone]

## 2013-12-04 NOTE — H&P (Signed)
History and Physical   Patient ID: Angel [name] MRN: [id number], DOB/AGE: 78/11/1931   Admit date: 12/04/2013 Date of Consult: 12/04/2013   Primary Physician: Alesia Richards, MD Primary Cardiologist:  Rolland Porter, accepted by Dr. Haroldine Laws  HPI: Angel [name] is a 80 y.o. female who is highly active and independent.  She lives in the area and has 3 children that live locally and check up on her.  She continues to drive and performs all of her ADL's/IAD's.  Her PMHx is notable for DDD spine, HLD, h/o breast CA, prior kidney stones and remote h/o "asthma" for which she used to take inhalers but now no longer does.  For the last 3 months she's been experiencing fatigue that has limited her social activities through church and with friends.  She does not currently have an exercise routine but stays active with work around the house.   Today she presented to the [facility] ED today after driving herself in for substernal heavy chest pressure associated with nausea, lightheadedness, diaphoresis and some degree of dyspnea.  She was doing some light grocery shopping pushing a cart when the pain started.  She has had 2-3 similar (and perhaps even more intense episodes) over the last couple of weeks.  She's been taking her prescribed PRN valium for these prior episodes thinking that it was some form of anxiety; though today she decided to come to the ED rather than take valium.  In the ED, her ECG showed no acute changes, initial troponin was negative.  She became CP free with NTG.  She received 3,864 units bolus of Heparin.  She currently denies any chest pain/pressure.  Of note, she recently had a steroid injection in her upper spine for chronic back pain issues related to DDD.  Problem List: Past Medical History  Diagnosis Date  . Asthma   . Hyperlipemia   . Spinal stenosis   . Osteoporosis   . Chronic bronchitis   . Kidney stones   . DDD (degenerative disc disease)   . Osteoarthritis    . Degenerative joint disease   . Breast cancer     bilateral  . Skin cancer of face     Past Surgical History  Procedure Laterality Date  . Vesicovaginal fistula closure w/ tah  age 34  . Tubal ligation  1961  . Appendectomy    . Breast lumpectomy      right breast  . Mastectomy  07/29/11    bilateral  . Cardiac surgery    . Abdominal hysterectomy    . Small intestine surgery      SBO  . Tonsillectomy       Allergies:  Allergies  Allergen Reactions  . Flax Seed Oil [Bio-Flax]     diarrhea  . Penicillins Rash    Home Medications: Prior to Admission medications   Medication Sig Start Date End Date Taking? Authorizing Provider  albuterol (PROVENTIL HFA;VENTOLIN HFA) 108 (90 BASE) MCG/ACT inhaler Inhale 1-2 puffs into the lungs every 4 (four) hours as needed for wheezing or shortness of breath.   Yes Historical Provider, MD  beclomethasone (QVAR) 80 MCG/ACT inhaler Inhale 2 puffs into the lungs 2 (two) times daily.   Yes Historical Provider, MD  celecoxib (CELEBREX) 200 MG capsule Take 200 mg by mouth daily.   Yes Historical Provider, MD  diazepam (VALIUM) 5 MG tablet Take 5 mg by mouth 3 (three) times daily as needed for anxiety.   Yes Historical Provider, MD  DULoxetine (  CYMBALTA) 60 MG capsule Take 60 mg by mouth daily.   Yes Historical Provider, MD  ezetimibe (ZETIA) 10 MG tablet Take 10 mg by mouth daily.   Yes Historical Provider, MD  montelukast (SINGULAIR) 10 MG tablet Take 10 mg by mouth at bedtime.   Yes Historical Provider, MD  raloxifene (EVISTA) 60 MG tablet Take 60 mg by mouth daily.   Yes Historical Provider, MD  ALPRAZolam Duanne Moron) 0.5 MG tablet Ad lib. 10/27/11   Historical Provider, MD  Calcium-Magnesium-Zinc 1000-400-15 MG TABS Take 1 tablet by mouth daily.      Historical Provider, MD  Cholecalciferol (VITAMIN D-3) 5000 UNITS TABS Take 1 tablet by mouth daily.     Historical Provider, MD  citalopram (CELEXA) 20 MG tablet Take 1 tablet by mouth  every day for  mood 11/09/13   Melissa R Smith, PA-C  citalopram (CELEXA) 40 MG tablet Take 40 mg by mouth daily. Patient taking 1 1/2 tablet per dosage for total of 60 mg.    Historical Provider, MD  clorazepate (TRANXENE) 7.5 MG tablet Take 7.5 mg by mouth 3 (three) times daily.     Historical Provider, MD  Coenzyme Q10 (CO Q-10 PO) Take 1 tablet by mouth daily.     Historical Provider, MD  esomeprazole (NEXIUM) 40 MG capsule Take 40 mg by mouth daily before breakfast.   01/26/11   Historical Provider, MD  Flaxseed, Linseed, (FLAX SEED OIL PO) Take 5 mLs by mouth daily. 1 tsp daily    Historical Provider, MD  HYDROcodone-acetaminophen (NORCO) 10-325 MG per tablet Take 1 tablet by mouth every 6 (six) hours as needed. For pain    Historical Provider, MD  mometasone (NASONEX) 50 MCG/ACT nasal spray Place 2 sprays into the nose daily. 08/08/13   Melissa R Smith, PA-C  Multiple Vitamins-Minerals (MULTIVITAMINS THER. W/MINERALS) TABS Take 1 tablet by mouth daily.      Historical Provider, MD  [facility] 160-4.5 MCG/ACT inhaler  08/06/13   Historical Provider, MD  tamoxifen (NOLVADEX) 10 MG tablet TAKE 2 TABLETS BY MOUTH DAILY 11/17/11   Lennis Marion Downer, MD    Inpatient Medications:  . heparin       Prescriptions prior to admission  Medication Sig Dispense Refill  . albuterol (PROVENTIL HFA;VENTOLIN HFA) 108 (90 BASE) MCG/ACT inhaler Inhale 1-2 puffs into the lungs every 4 (four) hours as needed for wheezing or shortness of breath.      . beclomethasone (QVAR) 80 MCG/ACT inhaler Inhale 2 puffs into the lungs 2 (two) times daily.      . celecoxib (CELEBREX) 200 MG capsule Take 200 mg by mouth daily.      . diazepam (VALIUM) 5 MG tablet Take 5 mg by mouth 3 (three) times daily as needed for anxiety.      . DULoxetine (CYMBALTA) 60 MG capsule Take 60 mg by mouth daily.      [name] ezetimibe (ZETIA) 10 MG tablet Take 10 mg by mouth daily.      . montelukast (SINGULAIR) 10 MG tablet Take 10 mg by mouth at bedtime.      .  raloxifene (EVISTA) 60 MG tablet Take 60 mg by mouth daily.      [name] ALPRAZolam (XANAX) 0.5 MG tablet Ad lib.      . Calcium-Magnesium-Zinc 1000-400-15 MG TABS Take 1 tablet by mouth daily.        . Cholecalciferol (VITAMIN D-3) 5000 UNITS TABS Take 1 tablet by mouth daily.       [name]  citalopram (CELEXA) 20 MG tablet Take 1 tablet by mouth  every day for mood  90 tablet  1  . citalopram (CELEXA) 40 MG tablet Take 40 mg by mouth daily. Patient taking 1 1/2 tablet per dosage for total of 60 mg.      . clorazepate (TRANXENE) 7.5 MG tablet Take 7.5 mg by mouth 3 (three) times daily.       . Coenzyme Q10 (CO Q-10 PO) Take 1 tablet by mouth daily.       [name] esomeprazole (NEXIUM) 40 MG capsule Take 40 mg by mouth daily before breakfast.        . Flaxseed, Linseed, (FLAX SEED OIL PO) Take 5 mLs by mouth daily. 1 tsp daily      . HYDROcodone-acetaminophen (NORCO) 10-325 MG per tablet Take 1 tablet by mouth every 6 (six) hours as needed. For pain      . mometasone (NASONEX) 50 MCG/ACT nasal spray Place 2 sprays into the nose daily.  17 g  12  . Multiple Vitamins-Minerals (MULTIVITAMINS THER. W/MINERALS) TABS Take 1 tablet by mouth daily.        . SYMBICORT 160-4.5 MCG/ACT inhaler       . tamoxifen (NOLVADEX) 10 MG tablet TAKE 2 TABLETS BY MOUTH DAILY  60 tablet  0    Family History  Problem Relation Age of Onset  . Emphysema Mother   . Heart disease Mother   . Pancreatic cancer Mother   . Cancer Mother     pancreatic  . Asthma Brother   . Asthma Daughter   . Asthma Grandchild   . Asthma Brother     deceased  . Heart disease Father   . Heart disease Brother   . Lymphoma Brother      History   Social History  . Marital Status: Divorced    Spouse Name: N/A    Number of Children: 58  . Years of Education: N/A   Occupational History  . Retired Other    Nursing   Social History Main Topics  . Smoking status: Never Smoker   . Smokeless tobacco: Never Used  . Alcohol Use: No  . Drug Use: No    . Sexual Activity: Not on file   Other Topics Concern  . Not on file   Social History Narrative  . No narrative on file     Review of Systems: All other systems reviewed and are otherwise negative except as noted above.  Physical Exam: Blood pressure 103/56, pulse 70, temperature 97.9 F (36.6 C), temperature source Oral, resp. rate 18, height 5\' 2"  (1.575 m), weight 68.4 kg (150 lb 12.7 oz), SpO2 94.00%.  General: Well developed, well nourished, in no acute distress. Head: Normocephalic, atraumatic, sclera non-icteric, no xanthomas, nares are without discharge.  Neck: Negative for carotid bruits. JVD not elevated. Lungs: Clear bilaterally to auscultation without wheezes, rales, or rhonchi. Breathing is unlabored. Heart: RRR with S1 S2. No murmurs, rubs, or gallops appreciated. Abdomen: Soft, non-tender, non-distended with normoactive bowel sounds. No hepatomegaly. No rebound/guarding. No obvious abdominal masses. Msk:  Strength and tone appears normal for age. Extremities: No clubbing, cyanosis or edema.  Distal pedal pulses are 2+ and equal bilaterally.  B/L palmar arches intact on allens testing. Neuro: Alert and oriented X 3. Moves all extremities spontaneously. Psych:  Responds to questions appropriately with a normal affect.  Labs: Recent Labs     12/04/13  1644  WBC  8.7  HGB  13.5  HCT  40.7  MCV  92.3  PLT  208   No results found for this basename: VITAMINB12, FOLATE, FERRITIN, TIBC, IRON, RETICCTPCT,  in the last 72 hours No results found for this basename: DDIMER,  in the last 72 hours  Recent Labs Lab 12/04/13 1644  NA 138  K 3.8  CL 98  CO2 28  BUN 15  CREATININE 0.80  CALCIUM 9.4  PROT 6.6  BILITOT 0.2*  ALKPHOS 50  ALT 14  AST 20  GLUCOSE 91   No results found for this basename: HGBA1C,  in the last 72 hours Recent Labs     12/04/13  1644  TROPONINI  <0.30   No components found with this basename: POCBNP,  No results found for this  basename: CHOL, HDL, LDLCALC, TRIG, CHOLHDL, LDLDIRECT,  in the last 72 hours No results found for this basename: TSH, T4TOTAL, FREET3, T3FREE, THYROIDAB,  in the last 72 hours  Radiology/Studies: Dg Chest Portable 1 View  12/04/2013   CLINICAL DATA Chest pain  EXAM PORTABLE CHEST - 1 VIEW  COMPARISON 09/18/2011  FINDINGS Stable moderate elevation of the right hemidiaphragm. The heart mediastinal contours are normal and stable. There is atherosclerotic calcification of the thoracic aortic arch. The lungs are clear. Negative for pleural effusion. Right axillary surgical clips noted. Bones appear osteopenic. There are degenerative changes and mild scoliosis of the thoracic spine.  IMPRESSION No active disease.  SIGNATURE  Electronically Signed   By: Curlene Dolphin M.D.   On: 12/04/2013 17:19    EKG:  Pending.  ASSESSMENT:  80 yo WF with PMHx notable for DDD spine, HLD, h/o breast CA, prior kidney stones and remote h/o "asthma" for which she used to take inhalers but now no longer does.  She presents with signs and symptoms concerning UA.  She is currently chest pain free at rest.  PLAN:  1-UA:  Aspirin 325mg  daily, Hep gtt per pharmacy for ACS protocol, high dose statin, avoid home dose narcotics so as not to mask any resurgence of pain, NTG PRN for now but will consider continuous if needed, consider BB and/or ACE-inhibitor but for now HR's are 65-75 bpm.  Will consider starting low dose lisinopril prior to discharge.  Caution with ACE-inhibotors/ARB's in the elderly in the absence of any known essential HTN. 2-Cycle cardiac biomarkers. 3-Check 2D echo in AM. 4-NPO after midnight for left heart catheterization and coronary angiography possible PCI in AM.  No absolute contraindications to DES.  Consider right radial arterial access. 5-Continue home medications otherwise. 6-Routine daily labs including risk stratification labs.  Code Status:  FULL CODE.  Signed, Charlton Haws, MD [facility] [place]  Physician  12/04/2013, 9:12 PM

## 2013-12-04 NOTE — Progress Notes (Signed)
ANTICOAGULATION CONSULT NOTE - Initial Consult  Pharmacy Consult for Heparin Indication: chest pain/ACS  Allergies  Allergen Reactions  . Flax Seed Oil [Bio-Flax]     diarrhea  . Penicillins Rash    Patient Measurements: Height: 5\' 2"  (157.5 cm) Weight: 150 lb 12.7 oz (68.4 kg) IBW/kg (Calculated) : 50.1  Vital Signs: Temp: 97.9 F (36.6 C) (03/10 2041) Temp src: Oral (03/10 2041) BP: 103/56 mmHg (03/10 1928) Pulse Rate: 70 (03/10 2041)  Labs:  Recent Labs  12/04/13 1644  HGB 13.5  HCT 40.7  PLT 208  CREATININE 0.80  TROPONINI <0.30    Estimated Creatinine Clearance: 51.7 ml/min (by C-G formula based on Cr of 0.8).   Medical History: Past Medical History  Diagnosis Date  . Asthma   . Hyperlipemia   . Spinal stenosis   . Osteoporosis   . Chronic bronchitis   . Kidney stones   . DDD (degenerative disc disease)   . Osteoarthritis   . Degenerative joint disease   . Breast cancer     bilateral  . Skin cancer of face     Medications:  Prescriptions prior to admission  Medication Sig Dispense Refill  . ALPRAZolam (XANAX) 0.5 MG tablet Ad lib.      . Calcium-Magnesium-Zinc 1000-400-15 MG TABS Take 1 tablet by mouth daily.        . celecoxib (CELEBREX) 200 MG capsule Take 200 mg by mouth daily.      . Cholecalciferol (VITAMIN D-3) 5000 UNITS TABS Take 1 tablet by mouth daily.       . citalopram (CELEXA) 20 MG tablet Take 1 tablet by mouth  every day for mood  90 tablet  1  . citalopram (CELEXA) 40 MG tablet Take 40 mg by mouth daily. Patient taking 1 1/2 tablet per dosage for total of 60 mg.      . clorazepate (TRANXENE) 7.5 MG tablet Take 7.5 mg by mouth 3 (three) times daily.       . Coenzyme Q10 (CO Q-10 PO) Take 1 tablet by mouth daily.       . diazepam (VALIUM) 5 MG tablet Take 5 mg by mouth 3 (three) times daily as needed for anxiety.      . DULoxetine (CYMBALTA) 60 MG capsule Take 60 mg by mouth daily.      [name] esomeprazole (NEXIUM) 40 MG capsule Take  40 mg by mouth daily before breakfast.        . ezetimibe (ZETIA) 10 MG tablet Take 10 mg by mouth daily.      . Flaxseed, Linseed, (FLAX SEED OIL PO) Take 5 mLs by mouth daily. 1 tsp daily      . HYDROcodone-acetaminophen (NORCO) 10-325 MG per tablet Take 1 tablet by mouth every 6 (six) hours as needed. For pain      . mometasone (NASONEX) 50 MCG/ACT nasal spray Place 2 sprays into the nose daily.  17 g  12  . montelukast (SINGULAIR) 10 MG tablet Take 10 mg by mouth at bedtime.      . Multiple Vitamins-Minerals (MULTIVITAMINS THER. W/MINERALS) TABS Take 1 tablet by mouth daily.        . raloxifene (EVISTA) 60 MG tablet Take 60 mg by mouth daily.      . tamoxifen (NOLVADEX) 10 MG tablet TAKE 2 TABLETS BY MOUTH DAILY  60 tablet  0  . [DISCONTINUED] albuterol (PROVENTIL HFA;VENTOLIN HFA) 108 (90 BASE) MCG/ACT inhaler Inhale 1-2 puffs into the lungs every 4 (four)  hours as needed for wheezing or shortness of breath.      . [DISCONTINUED] beclomethasone (QVAR) 80 MCG/ACT inhaler Inhale 2 puffs into the lungs 2 (two) times daily.      . [DISCONTINUED] SYMBICORT 160-4.5 MCG/ACT inhaler         Assessment: 80 yo female with chest pain for heparin.  Received heparin 3864 units IV bolus at [facility] at 1800  Goal of Therapy:  Heparin level 0.3-0.7 units/ml Monitor platelets by anticoagulation protocol: Yes   Plan:  Heparin 3000 units IV bolus, then 800 units/hr Check heparin level in 8 hours.  Charday Capetillo, Bronson Curb 12/04/2013,10:58 PM

## 2013-12-05 ENCOUNTER — Other Ambulatory Visit: Payer: Self-pay | Admitting: Oncology

## 2013-12-05 ENCOUNTER — Encounter (HOSPITAL_COMMUNITY)
Admission: EM | Disposition: A | Discharge status: Home / Self Care | Payer: 59 | Source: Home / Self Care | Attending: Internal Medicine

## 2013-12-05 DIAGNOSIS — R072 Precordial pain: Secondary | ICD-10-CM

## 2013-12-05 DIAGNOSIS — C50919 Malignant neoplasm of unspecified site of unspecified female breast: Secondary | ICD-10-CM

## 2013-12-05 DIAGNOSIS — I493 Ventricular premature depolarization: Secondary | ICD-10-CM | POA: Diagnosis present

## 2013-12-05 DIAGNOSIS — I10 Essential (primary) hypertension: Secondary | ICD-10-CM

## 2013-12-05 DIAGNOSIS — I2510 Atherosclerotic heart disease of native coronary artery without angina pectoris: Secondary | ICD-10-CM

## 2013-12-05 DIAGNOSIS — I4949 Other premature depolarization: Secondary | ICD-10-CM

## 2013-12-05 HISTORY — PX: LEFT HEART CATHETERIZATION WITH CORONARY ANGIOGRAM: SHX5451

## 2013-12-05 LAB — CBC
HCT: 37.1 % (ref 36.0–46.0)
Hemoglobin: 12.5 g/dL (ref 12.0–15.0)
MCH: 30.7 pg (ref 26.0–34.0)
MCHC: 33.7 g/dL (ref 30.0–36.0)
MCV: 91.2 fL (ref 78.0–100.0)
PLATELETS: 172 10*3/uL (ref 150–400)
RBC: 4.07 MIL/uL (ref 3.87–5.11)
RDW: 13.2 % (ref 11.5–15.5)
WBC: 5.9 10*3/uL (ref 4.0–10.5)

## 2013-12-05 LAB — LIPID PANEL
CHOLESTEROL: 171 mg/dL (ref 0–200)
HDL: 62 mg/dL (ref >39)
LDL Cholesterol: 101 mg/dL — ABNORMAL HIGH (ref 0–99)
TRIGLYCERIDES: 42 mg/dL (ref <150)
Total CHOL/HDL Ratio: 2.8 RATIO
VLDL: 8 mg/dL (ref 0–40)

## 2013-12-05 LAB — BASIC METABOLIC PANEL
BUN: 13 mg/dL (ref 6–23)
CALCIUM: 8.3 mg/dL — AB (ref 8.4–10.5)
CHLORIDE: 108 meq/L (ref 96–112)
CO2: 27 meq/L (ref 19–32)
Creatinine, Ser: 0.73 mg/dL (ref 0.50–1.10)
GFR calc Af Amer: >90 mL/min (ref >90)
GFR calc non Af Amer: 79 mL/min — ABNORMAL LOW (ref >90)
GLUCOSE: 99 mg/dL (ref 70–99)
Potassium: 4.4 mEq/L (ref 3.7–5.3)
SODIUM: 143 meq/L (ref 137–147)

## 2013-12-05 LAB — TROPONIN I
Troponin I: <0.3 ng/mL (ref <0.30)
Troponin I: <0.3 ng/mL (ref <0.30)
Troponin I: <0.3 ng/mL (ref <0.30)

## 2013-12-05 LAB — HEPARIN LEVEL (UNFRACTIONATED): Heparin Unfractionated: 0.38 IU/mL (ref 0.30–0.70)

## 2013-12-05 LAB — PROTIME-INR
INR: 1.02 (ref 0.00–1.49)
Prothrombin Time: 13.2 seconds (ref 11.6–15.2)

## 2013-12-05 LAB — TSH: TSH: 2.034 u[IU]/mL (ref 0.350–4.500)

## 2013-12-05 LAB — MAGNESIUM: Magnesium: 2.1 mg/dL (ref 1.5–2.5)

## 2013-12-05 SURGERY — LEFT HEART CATHETERIZATION WITH CORONARY ANGIOGRAM
Anesthesia: LOCAL

## 2013-12-05 MED ORDER — HEPARIN (PORCINE) IN NACL 2-0.9 UNIT/ML-% IJ SOLN
INTRAMUSCULAR | Status: AC
  Filled 2013-12-05: qty 1000

## 2013-12-05 MED ORDER — FENTANYL CITRATE 0.05 MG/ML IJ SOLN
INTRAMUSCULAR | Status: AC
  Filled 2013-12-05: qty 2

## 2013-12-05 MED ORDER — NITROGLYCERIN 0.2 MG/ML ON CALL CATH LAB
INTRAVENOUS | Status: AC
  Filled 2013-12-05: qty 1

## 2013-12-05 MED ORDER — VERAPAMIL HCL 2.5 MG/ML IV SOLN
INTRAVENOUS | Status: AC
  Filled 2013-12-05: qty 2

## 2013-12-05 MED ORDER — SODIUM CHLORIDE 0.9 % IV SOLN
INTRAVENOUS | Status: DC
  Administered 2013-12-05: 11:00:00 via INTRAVENOUS

## 2013-12-05 MED ORDER — METOPROLOL TARTRATE 12.5 MG HALF TABLET
12.5000 mg | ORAL_TABLET | Freq: Two times a day (BID) | ORAL | Status: DC
  Administered 2013-12-05 – 2013-12-06 (×2): 12.5 mg via ORAL
  Filled 2013-12-05 (×4): qty 1

## 2013-12-05 MED ORDER — MIDAZOLAM HCL 2 MG/2ML IJ SOLN
INTRAMUSCULAR | Status: AC
  Filled 2013-12-05: qty 2

## 2013-12-05 MED ORDER — ACETAMINOPHEN 325 MG PO TABS
650.0000 mg | ORAL_TABLET | Freq: Four times a day (QID) | ORAL | Status: DC | PRN
  Administered 2013-12-05 (×2): 650 mg via ORAL
  Filled 2013-12-05 (×2): qty 2

## 2013-12-05 MED ORDER — LIDOCAINE HCL (PF) 1 % IJ SOLN
INTRAMUSCULAR | Status: AC
  Filled 2013-12-05: qty 30

## 2013-12-05 MED ORDER — SODIUM CHLORIDE 0.9 % IV SOLN: INTRAVENOUS | Status: AC

## 2013-12-05 MED ORDER — HEPARIN SODIUM (PORCINE) 1000 UNIT/ML IJ SOLN
INTRAMUSCULAR | Status: AC
  Filled 2013-12-05: qty 1

## 2013-12-05 NOTE — H&P (View-Only) (Signed)
Pt. Seen and examined. Agree with the NP/PA-C note as written.  Troponins negative, having numerous PVC's. Will start low dose b-blocker (metoprolol tartrate 12.5 mg BID). Plan for LHC today.   Calvin Chura C. Raef Sprigg, MD, FACC Attending Cardiologist [facility]   

## 2013-12-05 NOTE — Progress Notes (Signed)
Patient: Angel [name] / Admit Date: 12/04/2013 / Date of Encounter: 12/05/2013, 10:35 AM  Subjective  Feels washed out. Has had intermittent chest pressure and dyspnea for days. Troponins negative.  Objective   Telemetry: NSR freq PVCs, occasional vent trigeminy, PACs  Physical Exam: Blood pressure 108/40, pulse 62, temperature 98.5 F (36.9 C), temperature source Oral, resp. rate 15, height 5' 2" (1.575 m), weight 150 lb 12.7 oz (68.4 kg), SpO2 92.00%. General: Well developed, well nourished WF in no acute distress. Head: Normocephalic, atraumatic, sclera non-icteric, no xanthomas, nares are without discharge. Neck: Negative for carotid bruits. JVP not elevated. Lungs: Clear bilaterally to auscultation without wheezes, rales, or rhonchi. Breathing is unlabored. Heart: RRR occ ectopy S1 S2 without murmurs, rubs, or gallops.  Abdomen: Soft, non-tender, non-distended with normoactive bowel sounds. No rebound/guarding. Extremities: No clubbing or cyanosis. No edema. Distal pedal pulses are 2+ and equal bilaterally. Neuro: Alert and oriented X 3. Moves all extremities spontaneously. Psych:  Responds to questions appropriately with a normal affect.   Intake/Output Summary (Last 24 hours) at 12/05/13 1035 Last data filed at 12/05/13 0700  Gross per 24 hour  Intake    394 ml  Output   2400 ml  Net  -2006 ml    Inpatient Medications:  . aspirin EC  325 mg Oral Daily  . atorvastatin  80 mg Oral q1800  . clorazepate  7.5 mg Oral TID  . DULoxetine  60 mg Oral Daily  . ezetimibe  10 mg Oral Daily  . montelukast  10 mg Oral QHS  . pantoprazole  40 mg Oral Daily  . raloxifene  60 mg Oral Daily  . sodium chloride  3 mL Intravenous Q12H   Infusions:  . heparin 800 Units/hr (12/05/13 0700)    Labs:  Recent Labs  12/04/13 1644 12/05/13 0039  NA 138 143  K 3.8 4.4  CL 98 108  CO2 28 27  GLUCOSE 91 99  BUN 15 13  CREATININE 0.80 0.73  CALCIUM 9.4 8.3*    Recent Labs  12/04/13 1644  AST 20  ALT 14  ALKPHOS 50  BILITOT 0.2*  PROT 6.6  ALBUMIN 3.7    Recent Labs  12/04/13 1644 12/05/13 0039  WBC 8.7 5.9  NEUTROABS 5.1  --   HGB 13.5 12.5  HCT 40.7 37.1  MCV 92.3 91.2  PLT 208 172    Recent Labs  12/04/13 0039 12/04/13 1644 12/05/13 0533  TROPONINI <0.30 <0.30 <0.30   Radiology/Studies:  Dg Chest Portable 1 View 12/04/2013   CLINICAL DATA Chest pain  EXAM PORTABLE CHEST - 1 VIEW  COMPARISON 09/18/2011  FINDINGS Stable moderate elevation of the right hemidiaphragm. The heart mediastinal contours are normal and stable. There is atherosclerotic calcification of the thoracic aortic arch. The lungs are clear. Negative for pleural effusion. Right axillary surgical clips noted. Bones appear osteopenic. There are degenerative changes and mild scoliosis of the thoracic spine.  IMPRESSION No active disease.  SIGNATURE  Electronically Signed   By: Susan  Turner M.D.   On: 12/04/2013 17:19     Assessment and Plan  1. Chest pain/dyspnea concerning for unstable angina 2. HLD 3. DDD s/p recent steroid injection in back 4. H/o breast CA 5. Frequent PVCs  Plan is for cardiac cath today. Await echo results. Several PVCs on tele - if no revealing CAD, consider initiation of low dose BB for possible symptomatic PVCs. Add TSH and Mg to labs.   Signed, Anniemae Haberkorn   PA-C

## 2013-12-05 NOTE — Progress Notes (Signed)
UR completed Watson Robarge K. Adia Crammer, RN, BSN, [place], CCM  12/05/2013 2:27 PM

## 2013-12-05 NOTE — Progress Notes (Addendum)
Dr. Donneta Romberg made aware of pt. pulled her IV, at present no IV access. Attempted to placed access but unsuccessful. IV team already tried earlier unsuccessful. Possibly may need PICC  line in am per Dr. Donneta Romberg.

## 2013-12-05 NOTE — Care Management Note (Addendum)
  Page 1 of 1   12/05/2013     2:21:10 PM   CARE MANAGEMENT NOTE 12/05/2013  Patient:  Angel [name], Angel [name]   Account Number:  [id number]  Date Initiated:  12/05/2013  Documentation initiated by:  Deontae Robson  Subjective/Objective Assessment:   Admitted with chest pain     Action/Plan:   CM to follow for dispostion needs   Anticipated DC Date:  12/08/2013   Anticipated DC Plan:  HOME/SELF CARE         Choice offered to / List presented to:             Status of service:  In process, will continue to follow Medicare Important Message given?   (If response is "NO", the following Medicare IM given date fields will be blank) Date Medicare IM given:   Date Additional Medicare IM given:    Discharge Disposition:    Per UR Regulation:  Reviewed for med. necessity/level of care/duration of stay  If discussed at Long Length of Stay Meetings, dates discussed:    Comments:  Per Hand off Report: ---12/05/2013 0845 by Louie Bun--- Called Dr A to comfirm status  Social:  highly active and independent.  She lives in the area and has 3 children that live locally and check up on her.  She continues to drive and performs all of her ADL's/IAD's. [place] RN, BSN, [place], CCM 12/05/2013

## 2013-12-05 NOTE — Progress Notes (Signed)
Received pt. transfer from [facility] center via care link, pt. Alert and oriented  Very pleasant appears comfortable denies any chest pain or any discomfort except for just being tired. Oriented to the room and unit , on call cardiology MD was notified and aware of pts. Admission.

## 2013-12-05 NOTE — H&P (View-Only) (Signed)
Patient: Angel [name] / Admit Date: 12/04/2013 / Date of Encounter: 12/05/2013, 10:35 AM  Subjective  Feels washed out. Has had intermittent chest pressure and dyspnea for days. Troponins negative.  Objective   Telemetry: NSR freq PVCs, occasional vent trigeminy, PACs  Physical Exam: Blood pressure 108/40, pulse 62, temperature 98.5 F (36.9 C), temperature source Oral, resp. rate 15, height 5\' 2"  (1.575 m), weight 150 lb 12.7 oz (68.4 kg), SpO2 92.00%. General: Well developed, well nourished WF in no acute distress. Head: Normocephalic, atraumatic, sclera non-icteric, no xanthomas, nares are without discharge. Neck: Negative for carotid bruits. JVP not elevated. Lungs: Clear bilaterally to auscultation without wheezes, rales, or rhonchi. Breathing is unlabored. Heart: RRR occ ectopy S1 S2 without murmurs, rubs, or gallops.  Abdomen: Soft, non-tender, non-distended with normoactive bowel sounds. No rebound/guarding. Extremities: No clubbing or cyanosis. No edema. Distal pedal pulses are 2+ and equal bilaterally. Neuro: Alert and oriented X 3. Moves all extremities spontaneously. Psych:  Responds to questions appropriately with a normal affect.   Intake/Output Summary (Last 24 hours) at 12/05/13 1035 Last data filed at 12/05/13 0700  Gross per 24 hour  Intake    394 ml  Output   2400 ml  Net  -2006 ml    Inpatient Medications:  . aspirin EC  325 mg Oral Daily  . atorvastatin  80 mg Oral q1800  . clorazepate  7.5 mg Oral TID  . DULoxetine  60 mg Oral Daily  . ezetimibe  10 mg Oral Daily  . montelukast  10 mg Oral QHS  . pantoprazole  40 mg Oral Daily  . raloxifene  60 mg Oral Daily  . sodium chloride  3 mL Intravenous Q12H   Infusions:  . heparin 800 Units/hr (12/05/13 0700)    Labs:  Recent Labs  12/04/13 1644 12/05/13 0039  NA 138 143  K 3.8 4.4  CL 98 108  CO2 28 27  GLUCOSE 91 99  BUN 15 13  CREATININE 0.80 0.73  CALCIUM 9.4 8.3*    Recent Labs  12/04/13 1644  AST 20  ALT 14  ALKPHOS 50  BILITOT 0.2*  PROT 6.6  ALBUMIN 3.7    Recent Labs  12/04/13 1644 12/05/13 0039  WBC 8.7 5.9  NEUTROABS 5.1  --   HGB 13.5 12.5  HCT 40.7 37.1  MCV 92.3 91.2  PLT 208 172    Recent Labs  12/04/13 0039 12/04/13 1644 12/05/13 0533  TROPONINI <0.30 <0.30 <0.30   Radiology/Studies:  Dg Chest Portable 1 View 12/04/2013   CLINICAL DATA Chest pain  EXAM PORTABLE CHEST - 1 VIEW  COMPARISON 09/18/2011  FINDINGS Stable moderate elevation of the right hemidiaphragm. The heart mediastinal contours are normal and stable. There is atherosclerotic calcification of the thoracic aortic arch. The lungs are clear. Negative for pleural effusion. Right axillary surgical clips noted. Bones appear osteopenic. There are degenerative changes and mild scoliosis of the thoracic spine.  IMPRESSION No active disease.  SIGNATURE  Electronically Signed   By: Curlene Dolphin M.D.   On: 12/04/2013 17:19     Assessment and Plan  1. Chest pain/dyspnea concerning for unstable angina 2. HLD 3. DDD s/p recent steroid injection in back 4. H/o breast CA 5. Frequent PVCs  Plan is for cardiac cath today. Await echo results. Several PVCs on tele - if no revealing CAD, consider initiation of low dose BB for possible symptomatic PVCs. Add TSH and Mg to labs.   Signed, Melina Copa  PA-C

## 2013-12-05 NOTE — Progress Notes (Signed)
  Echocardiogram 2D Echocardiogram has been performed.  Angel [name] FRANCES 12/05/2013, 10:19 AM

## 2013-12-05 NOTE — Progress Notes (Signed)
Medical Oncology  Notification from [facility] office that patient called re making return appointment; I had seen her last in Jan 2013 with subsequent appointments missed. POF sent to scheduler to reschedule.  Godfrey Pick, MD

## 2013-12-05 NOTE — Progress Notes (Signed)
[name] for Heparin Indication: chest pain/ACS  Allergies  Allergen Reactions  . Flax Seed Oil [Bio-Flax]     diarrhea  . Penicillins Rash    Patient Measurements: Height: 5\' 2"  (157.5 cm) Weight: 150 lb 12.7 oz (68.4 kg) IBW/kg (Calculated) : 50.1  Vital Signs: Temp: 98.5 F (36.9 C) (03/11 0506) Temp src: Oral (03/11 0506) BP: 115/48 mmHg (03/11 1101) Pulse Rate: 77 (03/11 1101)  Labs:  Recent Labs  12/04/13 0039 12/04/13 1644 12/05/13 0039 12/05/13 0533 12/05/13 1052  HGB  --  13.5 12.5  --   --   HCT  --  40.7 37.1  --   --   PLT  --  208 172  --   --   LABPROT  --   --   --   --  13.2  INR  --   --   --   --  1.02  HEPARINUNFRC  --   --   --   --  0.38  CREATININE  --  0.80 0.73  --   --   TROPONINI <0.30 <0.30  --  <0.30  --     Estimated Creatinine Clearance: 51.7 ml/min (by C-G formula based on Cr of 0.73).  Assessment: 80 yo female with chest pain continues on heparin. Started on drip rate at 800 units/hr and first level therapeutic at 0.38 units/mL. To go for cath today. Hgb and plts WNL and stable. No bleeding noted.  Goal of Therapy:  Heparin level 0.3-0.7 units/ml Monitor platelets by anticoagulation protocol: Yes   Plan:  1. Continue heparin at 800 units/hr 2. Confirmatory heparin level in 8 hours vs f/u after cath 3. Daily HL and CBC if to continue on heparin  Angel [name] D. Ehren Berisha, PharmD, BCPS Clinical Pharmacist Pager: [phone] 12/05/2013 11:50 AM

## 2013-12-05 NOTE — Progress Notes (Signed)
Pt. Seen and examined. Agree with the NP/PA-C note as written.  Troponins negative, having numerous PVC's. Will start low dose b-blocker (metoprolol tartrate 12.5 mg BID). Plan for LHC today.   Pixie Casino, MD, [facility] Attending Cardiologist [place]

## 2013-12-05 NOTE — CV Procedure (Signed)
      Cardiac Catheterization Operative Report  Angel [name] [id number] 3/11/20154:41 PM MCKEOWN,WILLIAM DAVID, MD  Procedure Performed:  1. Left Heart Catheterization 2. Selective Coronary Angiography 3. Left ventricular angiogram  Operator: Lauree Chandler, MD  Arterial access site:  Right radial artery.   Indication: 80 yo female with history of breast cancer, moderate CAD (60% mid LAD stenosis by cath July 2012), HLD admitted after an episode of chest pain. Cardiac markers are negative.                                     Procedure Details: The risks, benefits, complications, treatment options, and expected outcomes were discussed with the patient. The patient and/or family concurred with the proposed plan, giving informed consent. The patient was brought to the cath lab after IV hydration was begun and oral premedication was given. The patient was further sedated with Versed and Fentanyl. The right wrist was assessed with an Allens test which was positive. The right wrist was prepped and draped in a sterile fashion. 1% lidocaine was used for local anesthesia. Using the modified Seldinger access technique, a 5 French sheath was placed in the right radial artery. 3 mg Verapamil was given through the sheath. 3500 units IV heparin was given. Standard diagnostic catheters were used to perform selective coronary angiography. A pigtail catheter was used to perform a left ventricular angiogram. The sheath was removed from the right radial artery and a Terumo hemostasis band was applied at the arteriotomy site on the right wrist.   There were no immediate complications. The patient was taken to the recovery area in stable condition.   Hemodynamic Findings: Central aortic pressure: 93/47 Left ventricular pressure: 97/5/11  Angiographic Findings:  Left main: No obstructive disease.   Left Anterior Descending Artery: Large caliber vessel that courses to the apex. There is a  large caliber diagonal branch with no obstructive disease. The mid vessel has a focal 40-50% stenosis which appears unchanged from last cath in 2012. The distal LAD has no obstructive disease.   Circumflex Artery: Moderate caliber vessel with very small caliber first obtuse marginal branch and moderate caliber second obtuse marginal branch. No obstructive disease.   Right Coronary Artery: Large caliber dominant vessel with no obstructive disease.   Left Ventricular Angiogram: LVEF=55-60%. No MR. Normal caliber aortic root.   Impression: 1. Single vessel CAD with moderate stenosis mid LAD, unchanged from last cath in 2012 2. Normal LV function 3. Non-cardiac chest pain  Recommendations: Continued medical management of CAD.        Complications:  None. The patient tolerated the procedure well.

## 2013-12-05 NOTE — Interval H&P Note (Signed)
History and Physical Interval Note:  12/05/2013 4:28 PM  [name]  has presented today for cardiac cash with the diagnosis of chest pain, known CAD.  The various methods of treatment have been discussed with the patient and family. After consideration of risks, benefits and other options for treatment, the patient has consented to  Procedure(s): LEFT HEART CATHETERIZATION WITH CORONARY ANGIOGRAM (N/A) as a surgical intervention .  The patient's history has been reviewed, patient examined, no change in status, stable for surgery.  I have reviewed the patient's chart and labs.  Questions were answered to the patient's satisfaction.    Cath Lab Visit (complete for each Cath Lab visit)  Clinical Evaluation Leading to the Procedure:   ACS: no  Non-ACS:    Anginal Classification: CCS III  Anti-ischemic medical therapy: No Therapy  Non-Invasive Test Results: No non-invasive testing performed  Prior CABG: No previous CABG        Angel [name]

## 2013-12-06 ENCOUNTER — Telehealth: Payer: Self-pay | Admitting: Physician Assistant

## 2013-12-06 ENCOUNTER — Telehealth: Payer: Self-pay | Admitting: Oncology

## 2013-12-06 ENCOUNTER — Telehealth: Payer: Self-pay

## 2013-12-06 DIAGNOSIS — M1990 Unspecified osteoarthritis, unspecified site: Secondary | ICD-10-CM

## 2013-12-06 DIAGNOSIS — I200 Unstable angina: Secondary | ICD-10-CM

## 2013-12-06 LAB — BASIC METABOLIC PANEL
BUN: 15 mg/dL (ref 6–23)
CHLORIDE: 108 meq/L (ref 96–112)
CO2: 26 meq/L (ref 19–32)
CREATININE: 0.83 mg/dL (ref 0.50–1.10)
Calcium: 8.7 mg/dL (ref 8.4–10.5)
GFR calc Af Amer: 76 mL/min — ABNORMAL LOW (ref >90)
GFR calc non Af Amer: 65 mL/min — ABNORMAL LOW (ref >90)
Glucose, Bld: 95 mg/dL (ref 70–99)
Potassium: 4.1 mEq/L (ref 3.7–5.3)
SODIUM: 144 meq/L (ref 137–147)

## 2013-12-06 LAB — CBC
HCT: 38.5 % (ref 36.0–46.0)
Hemoglobin: 12.5 g/dL (ref 12.0–15.0)
MCH: 30.2 pg (ref 26.0–34.0)
MCHC: 32.5 g/dL (ref 30.0–36.0)
MCV: 93 fL (ref 78.0–100.0)
Platelets: 176 10*3/uL (ref 150–400)
RBC: 4.14 MIL/uL (ref 3.87–5.11)
RDW: 13.3 % (ref 11.5–15.5)
WBC: 7 10*3/uL (ref 4.0–10.5)

## 2013-12-06 MED ORDER — RANOLAZINE ER 500 MG PO TB12
500.0000 mg | ORAL_TABLET | Freq: Two times a day (BID) | ORAL | Status: DC
  Administered 2013-12-06: 500 mg via ORAL
  Filled 2013-12-06 (×2): qty 1

## 2013-12-06 MED ORDER — ATORVASTATIN CALCIUM 80 MG PO TABS: 80.0000 mg | ORAL_TABLET | Freq: Every day | ORAL | Status: DC

## 2013-12-06 MED ORDER — RANOLAZINE ER 1000 MG PO TB12: 1000.0000 mg | ORAL_TABLET | Freq: Two times a day (BID) | ORAL | Status: DC

## 2013-12-06 MED ORDER — HYDROCODONE-ACETAMINOPHEN 5-325 MG PO TABS
1.0000 | ORAL_TABLET | Freq: Four times a day (QID) | ORAL | Status: DC
  Administered 2013-12-06 (×2): 1 via ORAL
  Filled 2013-12-06 (×2): qty 1

## 2013-12-06 MED ORDER — ASPIRIN 81 MG PO TBEC: 81.0000 mg | DELAYED_RELEASE_TABLET | Freq: Every day | ORAL | Status: DC

## 2013-12-06 MED ORDER — RANOLAZINE ER 500 MG PO TB12
1000.0000 mg | ORAL_TABLET | Freq: Two times a day (BID) | ORAL | Status: DC
  Filled 2013-12-06: qty 2

## 2013-12-06 MED ORDER — METOPROLOL TARTRATE 25 MG PO TABS: 12.5000 mg | ORAL_TABLET | Freq: Two times a day (BID) | ORAL | Status: DC

## 2013-12-06 NOTE — Telephone Encounter (Signed)
Byran Hagar called for samples of ranexa 1000 mg placed samples up front

## 2013-12-06 NOTE — Telephone Encounter (Signed)
, °

## 2013-12-06 NOTE — Discharge Summary (Signed)
Physician Discharge Summary     Patient ID: Angel [name] MRN: [id number] DOB/AGE: 78/25/1933 78 y.o. Cardiologist:  Radford Pax  Admit date: 12/04/2013 Discharge date: 12/06/2013  Admission Diagnoses:  Unstable angina  Discharge Diagnoses:  Principal Problem:   Unstable angina Active Problems:   Hyperlipemia   Unspecified essential hypertension   PVC's (premature ventricular contractions)   Discharged Condition: stable  Hospital Course:   Angel [name] is a 80 y.o. female who is highly active and independent. She lives in the area and has 3 children that live locally and check up on her. She continues to drive and performs all of her ADL's/IAD's. Her PMHx is notable for DDD spine, HLD, h/o breast CA, prior kidney stones and remote h/o "asthma" for which she used to take inhalers but now no longer does. For the last 3 months she's been experiencing fatigue that has limited her social activities through church and with friends. She does not currently have an exercise routine but stays active with work around the house. Today she presented to the [facility] ED today after driving herself in for substernal heavy chest pressure associated with nausea, lightheadedness, diaphoresis and some degree of dyspnea. She was doing some light grocery shopping pushing a cart when the pain started. She has had 2-3 similar (and perhaps even more intense episodes) over the last couple of weeks. She's been taking her prescribed PRN valium for these prior episodes thinking that it was some form of anxiety; though today she decided to come to the ED rather than take valium. In the ED, her ECG showed no acute changes, initial troponin was negative. She became CP free with NTG. She received 3,864 units bolus of Heparin. She currently denies any chest pain/pressure.  Of note, she recently had a steroid injection in her upper spine for chronic back pain issues related to DDD.   She was admitted and started on IV  heparin.  She ruled out for MI.  Left heart cath revealed Single vessel CAD with moderate stenosis mid LAD, unchanged from last cath in 2012.  Normal LV function.  Ranexa 1000mg  BID was added.   PRN Norco was added for arthritis pain..  The patient was seen by Dr. Debara Pickett who felt she was stable for DC home.  Consults: None  Significant Diagnostic Studies:  Left heart cath  Arterial access site: Right radial artery.  Indication: 80 yo female with history of breast cancer, moderate CAD (60% mid LAD stenosis by cath July 2012), HLD admitted after an episode of chest pain. Cardiac markers are negative.  Procedure Details:  The risks, benefits, complications, treatment options, and expected outcomes were discussed with the patient. The patient and/or family concurred with the proposed plan, giving informed consent. The patient was brought to the cath lab after IV hydration was begun and oral premedication was given. The patient was further sedated with Versed and Fentanyl. The right wrist was assessed with an Allens test which was positive. The right wrist was prepped and draped in a sterile fashion. 1% lidocaine was used for local anesthesia. Using the modified Seldinger access technique, a 5 French sheath was placed in the right radial artery. 3 mg Verapamil was given through the sheath. 3500 units IV heparin was given. Standard diagnostic catheters were used to perform selective coronary angiography. A pigtail catheter was used to perform a left ventricular angiogram. The sheath was removed from the right radial artery and a Terumo hemostasis band was applied at the arteriotomy site  on the right wrist.  There were no immediate complications. The patient was taken to the recovery area in stable condition.  Hemodynamic Findings:  Central aortic pressure: 93/47  Left ventricular pressure: 97/5/11  Angiographic Findings:  Left main: No obstructive disease.  Left Anterior Descending Artery: Large caliber  vessel that courses to the apex. There is a large caliber diagonal branch with no obstructive disease. The mid vessel has a focal 40-50% stenosis which appears unchanged from last cath in 2012. The distal LAD has no obstructive disease.  Circumflex Artery: Moderate caliber vessel with very small caliber first obtuse marginal branch and moderate caliber second obtuse marginal branch. No obstructive disease.  Right Coronary Artery: Large caliber dominant vessel with no obstructive disease.  Left Ventricular Angiogram: LVEF=55-60%. No MR. Normal caliber aortic root.  Impression:  1. Single vessel CAD with moderate stenosis mid LAD, unchanged from last cath in 2012  2. Normal LV function  3. Non-cardiac chest pain  Recommendations: Continued medical management of CAD.  Complications: None. The patient tolerated the procedure well.   Treatments: See above  Discharge Exam: Blood pressure 99/56, pulse 72, temperature 98.1 F (36.7 C), temperature source Oral, resp. rate 18, height 5\' 2"  (1.575 m), weight 150 lb 12.7 oz (68.4 kg), SpO2 93.00%.   Disposition: 01-Home or Self Care      Discharge Orders   Future Appointments Provider Department Dept Phone   12/21/2013 9:50 AM Sharmon Revere [place] Office [phone]   01/17/2014 11:15 AM Unk Pinto, MD [place] [facility] [phone]   Future Orders Complete By Expires   Diet - low sodium heart healthy  As directed    Increase activity slowly  As directed        Medication List    STOP taking these medications       albuterol 108 (90 BASE) MCG/ACT inhaler  Commonly known as:  PROVENTIL HFA;VENTOLIN HFA     beclomethasone 80 MCG/ACT inhaler  Commonly known as:  QVAR     SYMBICORT 160-4.5 MCG/ACT inhaler  Generic drug:  budesonide-formoterol      TAKE these medications       ALPRAZolam 0.5 MG tablet  Commonly known as:  XANAX  Ad lib.     aspirin 81 MG EC tablet  Take 1  tablet (81 mg total) by mouth daily.     atorvastatin 80 MG tablet  Commonly known as:  LIPITOR  Take 1 tablet (80 mg total) by mouth daily at 6 PM.     Calcium-Magnesium-Zinc 1000-400-15 MG Tabs  Take 1 tablet by mouth daily.     celecoxib 200 MG capsule  Commonly known as:  CELEBREX  Take 200 mg by mouth daily.     citalopram 40 MG tablet  Commonly known as:  CELEXA  Take 40 mg by mouth daily. Patient taking 1 1/2 tablet per dosage for total of 60 mg.     citalopram 20 MG tablet  Commonly known as:  CELEXA  Take 1 tablet by mouth  every day for mood     clorazepate 7.5 MG tablet  Commonly known as:  TRANXENE  Take 7.5 mg by mouth 3 (three) times daily.     CO Q-10 PO  Take 1 tablet by mouth daily.     diazepam 5 MG tablet  Commonly known as:  VALIUM  Take 5 mg by mouth 3 (three) times daily as needed for anxiety.     DULoxetine  60 MG capsule  Commonly known as:  CYMBALTA  Take 60 mg by mouth daily.     esomeprazole 40 MG capsule  Commonly known as:  NEXIUM  Take 40 mg by mouth daily before breakfast.     ezetimibe 10 MG tablet  Commonly known as:  ZETIA  Take 10 mg by mouth daily.     FLAX SEED OIL PO  Take 5 mLs by mouth daily. 1 tsp daily     HYDROcodone-acetaminophen 10-325 MG per tablet  Commonly known as:  NORCO  Take 1 tablet by mouth every 6 (six) hours as needed. For pain     metoprolol tartrate 25 MG tablet  Commonly known as:  LOPRESSOR  Take 0.5 tablets (12.5 mg total) by mouth 2 (two) times daily.     mometasone 50 MCG/ACT nasal spray  Commonly known as:  NASONEX  Place 2 sprays into the nose daily.     montelukast 10 MG tablet  Commonly known as:  SINGULAIR  Take 10 mg by mouth at bedtime.     multivitamins ther. w/minerals Tabs tablet  Take 1 tablet by mouth daily.     raloxifene 60 MG tablet  Commonly known as:  EVISTA  Take 60 mg by mouth daily.     ranolazine 1000 MG SR tablet  Commonly known as:  RANEXA  Take 1 tablet (1,000  mg total) by mouth 2 (two) times daily.     tamoxifen 10 MG tablet  Commonly known as:  NOLVADEX  TAKE 2 TABLETS BY MOUTH DAILY     Vitamin D-3 5000 UNITS Tabs  Take 1 tablet by mouth daily.       Follow-up Information   Follow up with Richardson Dopp, PA-C On 12/21/2013. (9:50Am)    Specialty:  Physician Assistant   Contact information:   [address] [place] [address] [phone]       Signed: Tarri Fuller 12/06/2013, 11:17 AM

## 2013-12-06 NOTE — Discharge Instructions (Signed)
We will have Ranexa samples ready for you at the [facility] office.

## 2013-12-06 NOTE — Progress Notes (Signed)
Subjective: Pain all over from arthritis.  Objective: Vital signs in last 24 hours: Temp:  [98.1 F (36.7 C)-98.3 F (36.8 C)] 98.1 F (36.7 C) (03/12 0522) Pulse Rate:  [58-77] 58 (03/12 0522) Resp:  [18] 18 (03/12 0522) BP: (84-116)/(40-59) 107/44 mmHg (03/12 0522) SpO2:  [93 %-100 %] 93 % (03/12 0522) Weight:  [150 lb 12.7 oz (68.4 kg)] 150 lb 12.7 oz (68.4 kg) (03/11 1053) Last BM Date: 12/04/13  Intake/Output from previous day: 03/11 0701 - 03/12 0700 In: 868.5 [I.V.:868.5] Out: -  Intake/Output this shift:    Medications Current Facility-Administered Medications  Medication Dose Route Frequency Provider Last Rate Last Dose  . 0.9 %  sodium chloride infusion  250 mL Intravenous PRN Charlton Haws, MD   250 mL at 12/04/13 2323  . acetaminophen (TYLENOL) tablet 650 mg  650 mg Oral Q6H PRN Perry Mount, PA-C   650 mg at 12/05/13 1422  . aspirin EC tablet 325 mg  325 mg Oral Daily Charlton Haws, MD   325 mg at 12/05/13 1125  . atorvastatin (LIPITOR) tablet 80 mg  80 mg Oral q1800 Charlton Haws, MD   80 mg at 12/04/13 2324  . clorazepate (TRANXENE) tablet 7.5 mg  7.5 mg Oral TID Charlton Haws, MD   7.5 mg at 12/05/13 2045  . DULoxetine (CYMBALTA) DR capsule 60 mg  60 mg Oral Daily Charlton Haws, MD   60 mg at 12/05/13 1124  . ezetimibe (ZETIA) tablet 10 mg  10 mg Oral Daily Charlton Haws, MD   10 mg at 12/05/13 1124  . metoprolol tartrate (LOPRESSOR) tablet 12.5 mg  12.5 mg Oral BID Pixie Casino, MD   12.5 mg at 12/05/13 1505  . montelukast (SINGULAIR) tablet 10 mg  10 mg Oral QHS Charlton Haws, MD   10 mg at 12/05/13 2045  . nitroGLYCERIN (NITROSTAT) SL tablet 0.4 mg  0.4 mg Sublingual Q5 Min x 3 PRN Charlton Haws, MD      . pantoprazole (PROTONIX) EC tablet 40 mg  40 mg Oral Daily Charlton Haws, MD   40 mg at 12/05/13 1125  . raloxifene (EVISTA) tablet 60 mg  60 mg Oral Daily Charlton Haws, MD   60 mg at 12/05/13 1124  . sodium chloride 0.9 % injection 3 mL  3 mL Intravenous  Q12H Charlton Haws, MD      . sodium chloride 0.9 % injection 3 mL  3 mL Intravenous PRN Charlton Haws, MD        PE: General appearance: alert, cooperative and no distress Lungs: clear to auscultation bilaterally Heart: regular rate and rhythm, S1, S2 normal, no murmur, click, rub or gallop Extremities: No LEE Pulses: 2+ and symmetric Skin: Warm and dry.  Mild ecchymosis at radial cath site. Neurologic: Grossly normal  Lab Results:   Recent Labs  12/04/13 1644 12/05/13 0039 12/06/13 0400  WBC 8.7 5.9 7.0  HGB 13.5 12.5 12.5  HCT 40.7 37.1 38.5  PLT 208 172 176   BMET  Recent Labs  12/04/13 1644 12/05/13 0039 12/06/13 0400  NA 138 143 144  K 3.8 4.4 4.1  CL 98 108 108  CO2 28 27 26   GLUCOSE 91 99 95  BUN 15 13 15   CREATININE 0.80 0.73 0.83  CALCIUM 9.4 8.3* 8.7   PT/INR  Recent Labs  12/05/13 1052  LABPROT 13.2  INR 1.02   Cholesterol  Recent Labs  12/05/13 0039  CHOL 171   Lipid Panel  Component Value Date/Time   CHOL 171 12/05/2013 0039   TRIG 42 12/05/2013 0039   HDL 62 12/05/2013 0039   CHOLHDL 2.8 12/05/2013 0039   VLDL 8 12/05/2013 0039   LDLCALC 101* 12/05/2013 0039    Assessment/Plan   Principal Problem:   Unstable angina Active Problems:   Hyperlipemia   Unspecified essential hypertension   PVC's (premature ventricular contractions)  Plan:  SP LHC revealing single vessel CAD with moderate mid LAD.  No change from prior.  Normal LVEF.  HLD: on lipitor 80, zetia 10.   BP and HR stable.  Will try adding ranexa 500mg  bid.  BP stable.  Norco for arthritis pain. Ambulate and DC home.  Follow up with Dr. Radford Pax.    LOS: 2 days    Catlin Aycock 12/06/2013 7:58 AM

## 2013-12-06 NOTE — Progress Notes (Signed)
Pt. Seen and examined. Agree with the NP/PA-C note as written.  Cath showed moderate LAD disease, unchanged. She has had suppression of her PVC's with b-blocker. Agree with trial of Ranexa- starting antianginal dose is 1000 mg BID.  She may have difficulty affording this medication - we can sample from the office. [place]ytas for discharge home today.   Pixie Casino, MD, [facility] Attending Cardiologist [facility]

## 2013-12-19 NOTE — Telephone Encounter (Signed)
Spoke with patient's daughter, [name] and advised her for patient to stop Metoprolol (Lopressor) and to keep follow up appointment with Richardson Dopp, PA-C for Friday 3/27.  I advised that if patient's symptoms worsen, to call back or to proceed to the ER.  Patient's daughter verbalized understanding and agreement.

## 2013-12-19 NOTE — Telephone Encounter (Signed)
New message     Daughter says her mother's pulse is 40----she has no energy and stays in bed all day.  Should she be seen sooner than fri?

## 2013-12-19 NOTE — Telephone Encounter (Signed)
Spoke with patient's daughter who states patient has been very lethargic and sleeping a great deal since prior to her admission and heart catheterization 3/10-3/13.  Daughter states she checked patient's heart rate this morning and it was 40 bpm. Daughter states she does not have a BP cuff and this is the first time she has checked her mother's pulse since patient complained of these symptoms.  Since discharge from [facility], patient has been taking Lopressor 12.5 mg BID.  I advised daughter that I would talk with Dr. Lovena Le, DOD and will call her back with his advice.  Patient has an appointment with Richardson Dopp, PA-C on Friday 3/27 for post hospital f/u.  Patient has not seen a cardiologist in our office for many years. Dr. Lovena Le advises that patient stop Lopressor and keep follow up appointment with Richardson Dopp, PA-C.  I left message on daughter's voice mail to call back for advice.

## 2013-12-19 NOTE — Telephone Encounter (Signed)
I sent this to Triage to evaluate

## 2013-12-21 ENCOUNTER — Ambulatory Visit (INDEPENDENT_AMBULATORY_CARE_PROVIDER_SITE_OTHER): Payer: Medicare Other | Admitting: Physician Assistant

## 2013-12-21 ENCOUNTER — Encounter: Payer: Self-pay | Admitting: Physician Assistant

## 2013-12-21 VITALS — BP 121/83 | HR 75 | Ht 62.0 in | Wt 152.0 lb

## 2013-12-21 DIAGNOSIS — E785 Hyperlipidemia, unspecified: Secondary | ICD-10-CM

## 2013-12-21 DIAGNOSIS — I2510 Atherosclerotic heart disease of native coronary artery without angina pectoris: Secondary | ICD-10-CM

## 2013-12-21 DIAGNOSIS — R079 Chest pain, unspecified: Secondary | ICD-10-CM

## 2013-12-21 DIAGNOSIS — I4949 Other premature depolarization: Secondary | ICD-10-CM

## 2013-12-21 DIAGNOSIS — I493 Ventricular premature depolarization: Secondary | ICD-10-CM

## 2013-12-21 NOTE — Progress Notes (Signed)
[place], [place],   [address] Phone: [phone] Fax:  [phone]  Date:  12/21/2013   ID:  Angel [name], DOB [birth year], MRN [id number]  PCP:  Alesia Richards, MD  Cardiologist:  Dr. Fransico Him     History of Present Illness: Angel [name] is a 80 y.o. female with a hx of non-obstructive CAD by cath in 2012, DDD, HL, breast CA, remote asthma, GERD.  She was admitted 3/10-3/12 with substernal chest discomfort concerning for unstable angina. Cardiac enzymes remained normal. She had improvement with nitroglycerin. She underwent cardiac catheterization that demonstrated nonobstructive CAD with mid LAD 40-50% stenosis. This was unchanged from 2012. Continued medical therapy was recommended. Ranolazine was added to her medical regimen.  Patient had significant side effects to metoprolol with fatigue and lethargy. She has stopped this medication and feels much better. She denies any further chest pain. She denies significant dyspnea. She denies orthopnea, PND or edema. She denies syncope.  Studies:  - LHC (12/04/13):  mLAD 40-50, EF 55-60%.  - Echo (12/05/13):  EF 50-55%, no RWMA, atrial septal aneurysm.  - Nuclear (03/18/11):  No ischemia, EF 75%   Recent Labs: 12/04/2013: ALT 14  12/05/2013: HDL Cholesterol 62; LDL (calc) 101*; TSH 2.034  12/06/2013: Creatinine 0.83; Hemoglobin 12.5; Potassium 4.1   Wt Readings from Last 3 Encounters:  12/05/13 150 lb 12.7 oz (68.4 kg)  12/05/13 150 lb 12.7 oz (68.4 kg)  11/10/11 147 lb 6.4 oz (66.86 kg)     Past Medical History  Diagnosis Date  . Asthma   . Hyperlipemia   . Spinal stenosis   . Osteoporosis   . Chronic bronchitis   . Kidney stones   . DDD (degenerative disc disease)   . Osteoarthritis   . Degenerative joint disease   . Breast cancer     bilateral  . Skin cancer of face     Current Outpatient Prescriptions  Medication Sig Dispense Refill  . ALPRAZolam (XANAX) 0.5 MG tablet Ad  lib.      [name] aspirin EC 81 MG EC tablet Take 1 tablet (81 mg total) by mouth daily.  30 tablet  0  . atorvastatin (LIPITOR) 80 MG tablet Take 1 tablet (80 mg total) by mouth daily at 6 PM.  30 tablet  5  . Calcium-Magnesium-Zinc 1000-400-15 MG TABS Take 1 tablet by mouth daily.        . celecoxib (CELEBREX) 200 MG capsule Take 200 mg by mouth daily.      . Cholecalciferol (VITAMIN D-3) 5000 UNITS TABS Take 1 tablet by mouth daily.       . citalopram (CELEXA) 20 MG tablet Take 1 tablet by mouth  every day for mood  90 tablet  1  . citalopram (CELEXA) 40 MG tablet Take 40 mg by mouth daily. Patient taking 1 1/2 tablet per dosage for total of 60 mg.      . clorazepate (TRANXENE) 7.5 MG tablet Take 7.5 mg by mouth 3 (three) times daily.       . Coenzyme Q10 (CO Q-10 PO) Take 1 tablet by mouth daily.       . diazepam (VALIUM) 5 MG tablet Take 5 mg by mouth 3 (three) times daily as needed for anxiety.      . DULoxetine (CYMBALTA) 60 MG capsule Take 60 mg by mouth daily.      [name] esomeprazole (NEXIUM) 40 MG capsule Take 40 mg by mouth daily before breakfast.        .  ezetimibe (ZETIA) 10 MG tablet Take 10 mg by mouth daily.      . Flaxseed, Linseed, (FLAX SEED OIL PO) Take 5 mLs by mouth daily. 1 tsp daily      . HYDROcodone-acetaminophen (NORCO) 10-325 MG per tablet Take 1 tablet by mouth every 6 (six) hours as needed. For pain      . mometasone (NASONEX) 50 MCG/ACT nasal spray Place 2 sprays into the nose daily.  17 g  12  . montelukast (SINGULAIR) 10 MG tablet Take 10 mg by mouth at bedtime.      . Multiple Vitamins-Minerals (MULTIVITAMINS THER. W/MINERALS) TABS Take 1 tablet by mouth daily.        . raloxifene (EVISTA) 60 MG tablet Take 60 mg by mouth daily.      . ranolazine (RANEXA) 1000 MG SR tablet Take 1 tablet (1,000 mg total) by mouth 2 (two) times daily.  60 tablet  5  . tamoxifen (NOLVADEX) 10 MG tablet TAKE 2 TABLETS BY MOUTH DAILY  60 tablet  0   No current facility-administered  medications for this visit.    Allergies:   Flax seed oil and Penicillins   Social History:  The patient  reports that she has never smoked. She has never used smokeless tobacco. She reports that she does not drink alcohol or use illicit drugs.   Family History:  The patient's family history includes Asthma in her brother, brother, daughter, and grandchild; Cancer in her mother; Emphysema in her mother; Heart disease in her brother, father, and mother; Lymphoma in her brother; Pancreatic cancer in her mother.   ROS:  Please see the history of present illness.      All other systems reviewed and negative.   PHYSICAL EXAM: VS:  BP 121/83  Pulse 75  Ht 5\' 2"  (1.575 m)  Wt 152 lb (68.947 kg)  BMI 27.79 kg/m2 Well nourished, well developed, in no acute distress HEENT: normal Neck: no JVD Cardiac:  normal S1, S2; RRR; no murmur Lungs:  clear to auscultation bilaterally, no wheezing, rhonchi or rales Abd: soft, nontender, no hepatomegaly Ext: no edemaleft wrist without hematoma or mass  Skin: warm and dry Neuro:  CNs 2-12 intact, no focal abnormalities noted  EKG:  NSR, HR 75, normal axis, PVCs, no ST changes, QTc 419  ASSESSMENT AND PLAN:  1. Chest Pain: She has been placed on Ranexa for possible microvascular ischemia. She has not had recurrent anginal symptoms since starting this medication.  QTc is ok.  She could not tolerate metoprolol. She will remain off of this medication. 2. CAD: Continue aspirin. She is intolerant of statin and Zetia. Continue ranolazine. 3. Hyperlipidemia: Continue Zetia.  Statin intolerant.  4. PVCs: She is asymptomatic. She has normal LV function. At this point, I recommend she continue on her current regimen. If she has symptoms with PVCs, we could consider adding pindolol. She is not interested in that drug at this time. 5. Disposition: Follow up with Dr. Radford Pax in 3 months.  Signed, Richardson Dopp, PA-C  12/21/2013 9:04 AM

## 2013-12-21 NOTE — Patient Instructions (Signed)
Your physician recommends that you schedule a follow-up appointment in: 3 months with Dr. Turner.  Your physician recommends that you continue on your current medications as directed. Please refer to the Current Medication list given to you today.   

## 2013-12-26 ENCOUNTER — Telehealth: Payer: Self-pay | Admitting: Oncology

## 2013-12-26 ENCOUNTER — Encounter: Payer: Self-pay | Admitting: Oncology

## 2013-12-26 ENCOUNTER — Other Ambulatory Visit (HOSPITAL_BASED_OUTPATIENT_CLINIC_OR_DEPARTMENT_OTHER): Payer: Medicare Other

## 2013-12-26 ENCOUNTER — Ambulatory Visit (HOSPITAL_BASED_OUTPATIENT_CLINIC_OR_DEPARTMENT_OTHER): Payer: Medicare Other | Admitting: Oncology

## 2013-12-26 VITALS — BP 123/76 | HR 89 | Temp 97.8°F | Resp 18 | Ht 62.0 in | Wt 152.0 lb

## 2013-12-26 DIAGNOSIS — C50919 Malignant neoplasm of unspecified site of unspecified female breast: Secondary | ICD-10-CM

## 2013-12-26 DIAGNOSIS — Z9010 Acquired absence of unspecified breast and nipple: Secondary | ICD-10-CM

## 2013-12-26 DIAGNOSIS — R229 Localized swelling, mass and lump, unspecified: Secondary | ICD-10-CM

## 2013-12-26 DIAGNOSIS — M810 Age-related osteoporosis without current pathological fracture: Secondary | ICD-10-CM

## 2013-12-26 DIAGNOSIS — Z853 Personal history of malignant neoplasm of breast: Secondary | ICD-10-CM

## 2013-12-26 LAB — CBC WITH DIFFERENTIAL/PLATELET
BASO%: 0.7 % (ref 0.0–2.0)
Basophils Absolute: 0 10*3/uL (ref 0.0–0.1)
EOS ABS: 0.3 10*3/uL (ref 0.0–0.5)
EOS%: 4 % (ref 0.0–7.0)
HEMATOCRIT: 40 % (ref 34.8–46.6)
HGB: 13 g/dL (ref 11.6–15.9)
LYMPH%: 34.2 % (ref 14.0–49.7)
MCH: 29.4 pg (ref 25.1–34.0)
MCHC: 32.5 g/dL (ref 31.5–36.0)
MCV: 90.4 fL (ref 79.5–101.0)
MONO#: 0.6 10*3/uL (ref 0.1–0.9)
MONO%: 8.6 % (ref 0.0–14.0)
NEUT%: 52.5 % (ref 38.4–76.8)
NEUTROS ABS: 3.5 10*3/uL (ref 1.5–6.5)
PLATELETS: 220 10*3/uL (ref 145–400)
RBC: 4.42 10*6/uL (ref 3.70–5.45)
RDW: 13.4 % (ref 11.2–14.5)
WBC: 6.7 10*3/uL (ref 3.9–10.3)
lymph#: 2.3 10*3/uL (ref 0.9–3.3)

## 2013-12-26 LAB — COMPREHENSIVE METABOLIC PANEL (CC13)
ALT: 14 U/L (ref 0–55)
AST: 21 U/L (ref 5–34)
Albumin: 4.1 g/dL (ref 3.5–5.0)
Alkaline Phosphatase: 49 U/L (ref 40–150)
Anion Gap: 10 mEq/L (ref 3–11)
BILIRUBIN TOTAL: 0.5 mg/dL (ref 0.20–1.20)
BUN: 14.4 mg/dL (ref 7.0–26.0)
CALCIUM: 9.9 mg/dL (ref 8.4–10.4)
CO2: 29 meq/L (ref 22–29)
Chloride: 102 mEq/L (ref 98–109)
Creatinine: 0.8 mg/dL (ref 0.6–1.1)
GLUCOSE: 90 mg/dL (ref 70–140)
Potassium: 3.9 mEq/L (ref 3.5–5.1)
SODIUM: 141 meq/L (ref 136–145)
TOTAL PROTEIN: 6.9 g/dL (ref 6.4–8.3)

## 2013-12-26 NOTE — Progress Notes (Signed)
OFFICE PROGRESS NOTE   12/26/2013   Physicians:W.McKeown, T.Gerkin, R.Ramos, P.Wright, T.Turner   INTERVAL HISTORY:  Patient is seen, alone for visit, for first time back at this office since 09-2011, having requested this visit apparently due to enlarging nodular area above right mastectomy scar. She reports noticing the area "the size of a pea" within the past few months, size increased since then.  She reports significantly increased fatigue with minimal exertion since Jan 2015. She was hospitalized for chest pain 3-10 thru 12-06-13. Cardiac cath found nonobstructive CAD with mid LAD 40-50% stable, and has been continued on medical therapy with addition of ranolazine. Patient reports improvement in chest pain since DC.  When I saw her in 09-2011, plan was to start tamoxifen for T1N0M0 ER/PR positive, HER2 negative invasive ductal carcinoma of right breast. She had right mastectomy and what was intended as prophylactic left mastectomy by Dr [name] 07-29-2011, with additional finding of DCIS left breast. Tamoxifen was chosen particularly because of low bone density. She also has history of DCIS right breast in 2000. Patient tells me that she "did not tolerate" tamoxifen, tho she cannot recall details, and has been on raloxifene instead, which she has tolerated well. She does not seem to have seen oncology in interim, but does follow with Dr [name] as PCP. Only recent imaging in this EMR was CXR 12-04-13.  She has appointment scheduled with Dr [name] 01-17-14 and with Dr Fransico Him next in June.   ONCOLOGIC HISTORY  Stage 1 right breast cancer and left DCIS 2012 as above, with bilateral mastectomies and 4 right axillary nodes.bilateral mastectomies by Dr.Gerkin on Nov.1, 2012. The surgery was right mastectomy with 4 axillary nodes and what was planned as prophylactic left mastectomy, but with unexpected finding of DCIS in left breast (left mammogram 06-10-11 and bilateral breast MRI 06-30-11 had  shown nothing of concern on left).  Pathology [phone] [facility] reports right mastectomy specimen with 1.2 cm invasive ductal carcinoma grade 2 with closest margin 0.1 cm deep, no LVSI, extensive intraductal component with clear margins, 4 axillary nodes negative, ER + 93%, PR + 35%, HER2 negative by CISH for staging pT1c, pN0. The left mastectomy specimen had 1.2 cm low grade DCIS with margins >0.2 cm which was ER + 82% and PR + 94%.  Per patient, intolerant to tamoxifen and subsequently on raloxifene. (See above)  Hx DCIS right breast 2000, treated with lumpectomy and radiation, and briefly on tamoxifen. At the time that tamoxifen was again prescribed in 2012   Review of systems as above, also: Appetite decreased. Not SOB at rest, no cough or sputum. No nausea or vomiting. Bowels ok. No LE swelling. No bleeding. No fever or recent infectious illness reported. Remainder of 10 point Review of Systems negative.  Objective:  Vital signs in last 24 hours:  BP 123/76  Pulse 89  Temp(Src) 97.8 F (36.6 C) (Oral)  Resp 18  Ht '5\' 2"'  (1.575 m)  Wt 152 lb (68.947 kg)  BMI 27.79 kg/m2 Weight is up 2 lbs from 09-2011.  Alert, oriented, cooperative, seems anxious. Ambulatory without assistance. Respirations not labored RA.   HEENT:PERRL, sclerae not icteric. Oral mucosa moist without lesions, posterior pharynx clear.  Neck supple. No JVD.  Lymphatics:no cervical,suraclavicular, axillary or inguinal adenopathy Resp: clear to auscultation bilaterally and normal percussion bilaterally Cardio: regular rate and rhythm. No gallop. GI: soft, nontender, not distended, no mass or organomegaly. Normally active bowel sounds.  Musculoskeletal/ Extremities: without pitting edema,  cords, tenderness Neuro: CN, motor, sensory, cerebellar nonfocal  PSYCH as above Skin without rash, ecchymosis, petechiae. Lesion right anterior chest as below. Breasts: bilateral mastectomy scars healed. Several  cm above right mastectomy scar medially is ~ 1.5 x 2 cm slightly irregular, nodular lesion which is not mobile, not tender. Chest wall otherwise not remarkable. Axillae not remarkable.    Lab Results:  Results for orders placed in visit on 12/26/13  CBC WITH DIFFERENTIAL      Result Value Ref Range   WBC 6.7  3.9 - 10.3 10e3/uL   NEUT# 3.5  1.5 - 6.5 10e3/uL   HGB 13.0  11.6 - 15.9 g/dL   HCT 40.0  34.8 - 46.6 %   Platelets 220  145 - 400 10e3/uL   MCV 90.4  79.5 - 101.0 fL   MCH 29.4  25.1 - 34.0 pg   MCHC 32.5  31.5 - 36.0 g/dL   RBC 4.42  3.70 - 5.45 10e6/uL   RDW 13.4  11.2 - 14.5 %   lymph# 2.3  0.9 - 3.3 10e3/uL   MONO# 0.6  0.1 - 0.9 10e3/uL   Eosinophils Absolute 0.3  0.0 - 0.5 10e3/uL   Basophils Absolute 0.0  0.0 - 0.1 10e3/uL   NEUT% 52.5  38.4 - 76.8 %   LYMPH% 34.2  14.0 - 49.7 %   MONO% 8.6  0.0 - 14.0 %   EOS% 4.0  0.0 - 7.0 %   BASO% 0.7  0.0 - 2.0 %  COMPREHENSIVE METABOLIC PANEL (CH88)      Result Value Ref Range   Sodium 141  136 - 145 mEq/L   Potassium 3.9  3.5 - 5.1 mEq/L   Chloride 102  98 - 109 mEq/L   CO2 29  22 - 29 mEq/L   Glucose 90  70 - 140 mg/dl   BUN 14.4  7.0 - 26.0 mg/dL   Creatinine 0.8  0.6 - 1.1 mg/dL   Total Bilirubin 0.50  0.20 - 1.20 mg/dL   Alkaline Phosphatase 49  40 - 150 U/L   AST 21  5 - 34 U/L   ALT 14  0 - 55 U/L   Total Protein 6.9  6.4 - 8.3 g/dL   Albumin 4.1  3.5 - 5.0 g/dL   Calcium 9.9  8.4 - 10.4 mg/dL   Anion Gap 10  3 - 11 mEq/L     Studies/Results: PORTABLE CHEST - 1 VIEW  12-04-13 COMPARISON  09/18/2011  FINDINGS  Stable moderate elevation of the right hemidiaphragm. The heart  mediastinal contours are normal and stable. There is atherosclerotic  calcification of the thoracic aortic arch. The lungs are clear.  Negative for pleural effusion. Right axillary surgical clips noted.  Bones appear osteopenic. There are degenerative changes and mild  scoliosis of the thoracic spine.  IMPRESSION  No active  disease     Medications: I have reviewed the patient's current medications.  DISCUSSION: I have recommended that she see Dr [name] for biopsy or excisional biopsy of the nodular right chest wall lesion. Labs are good today; further imaging may be appropriate after pathology known, but biopsy is next step.  She should keep appointment with Dr [name] also this month.  Assessment/Plan: 1.Nodular skin lesion above right mastectomy scar, in patient with right lumpectomy and local radiation in 2000 then stage 1 invasive ductal carcinoma 2012 treated with mastectomy. Refer back to Dr [name] for diagnostic biopsy. I will see her back after biopsy.  2.History of bilateral DCIS and stage 1 invasive right breast cancer 3.intolerance to tamoxifen 4.low bone density: osteoporotic in 2012 5.CAD, stable by cardiac cath 11-2013. Angina improved with present medications 6.degenerative arthritis and spinal stenosis followed by Dr Nelva Bush 7. Chronic bronchitis and asthma 8.remote hysterectomy without oophorectomy   Patient stated agreement with plan above and had all questions answered to her satisfaction. Time spent 25 min. Including >50% counseling and coordination of care  Angel [name] P, MD   12/26/2013, 3:31 PM

## 2013-12-26 NOTE — Telephone Encounter (Signed)
gve the pt her April 2015 appt calendar along with the avs. S/w sonya from [facility] and she scheduled the pt to see dr gerkin on 01/07/2014@10 :30am. Per sonya pt will need a breast US prior. Pt is aware and will be waiting for a call back. S/w dr Marko Plume and she stated that the pt does not need the breast US and she will call [facility] and speak with dr gerkin himself.

## 2013-12-28 ENCOUNTER — Telehealth: Payer: Self-pay

## 2013-12-28 NOTE — Telephone Encounter (Signed)
Angel [name] was calling to find out when the [place] of her breast was scheduled as she needs this prior to Dr. Gala Lewandowsky appointment on 01-07-14. Told Ms. Fedele that Dr. Marko Plume told Crystal in scheduling that Ms. Denley did not need [place] and she would call Dr. [name] and speak with him directly. Will notify Ms. Vanalstine if the plan changes.  Pt. Verbalized understanding.

## 2014-01-02 ENCOUNTER — Telehealth: Payer: Self-pay | Admitting: Cardiology

## 2014-01-02 ENCOUNTER — Telehealth: Payer: Self-pay | Admitting: Physician Assistant

## 2014-01-02 NOTE — Telephone Encounter (Signed)
In basket - patient called [address] [facility] is handling  The question

## 2014-01-02 NOTE — Telephone Encounter (Signed)
New message     Pt was prescribed renexa while in hosp. Pt is off balanced, dizzy and constipated.  She thinks it is from the renexa.  Should she stop this medication?

## 2014-01-02 NOTE — Telephone Encounter (Signed)
Pt called and said the Ranexa is not agreeing with her.She wants to know what is her next option?

## 2014-01-02 NOTE — Telephone Encounter (Signed)
Pt stated with holding the medication last night and this morning she started having SOB, hot face, and high BP for her. She felt tightness in her Chest and back so she took the medication at three. She does not to hold the medication in fear of having this pain and symptoms. She has felt better since being back on the medication. She wants to switch to another med vs stopping the med.

## 2014-01-02 NOTE — Telephone Encounter (Signed)
TO Dr Turner to advise.  

## 2014-01-02 NOTE — Telephone Encounter (Signed)
Have her hold it for a few days and call and let me know if her symptoms improve

## 2014-01-02 NOTE — Telephone Encounter (Signed)
SHe had nonosbstructive disease on her cath but may have some coronary vasospasm.  Stop Ranexa and try Imdur 30mg  daily.  Please have her call and let us know if her symptoms have improved with med changed or if she has any worsening of her symptoms

## 2014-01-03 ENCOUNTER — Telehealth: Payer: Self-pay | Admitting: *Deleted

## 2014-01-03 ENCOUNTER — Other Ambulatory Visit: Payer: Self-pay | Admitting: General Surgery

## 2014-01-03 MED ORDER — ISOSORBIDE MONONITRATE ER 30 MG PO TB24: 30.0000 mg | ORAL_TABLET | Freq: Every day | ORAL | Status: DC

## 2014-01-03 NOTE — Telephone Encounter (Signed)
Pt is aware. Rx sent in for pt.

## 2014-01-03 NOTE — Telephone Encounter (Signed)
Follow up     Waiting to hear from the nurse.  Pt is still having "episodes".

## 2014-01-03 NOTE — Telephone Encounter (Signed)
Spoke with pt and we switched medications. She is aware to let us know how she is feeling. I told her to keep her scheduled appt that she has in June. If symptoms worsen make sure to call us. If BP or SOB becomes to much to bare she needs to go to the hospital. Pt agreed on plan. She will call in 3-5 days to let us know how shes doing on new medication.

## 2014-01-03 NOTE — Telephone Encounter (Addendum)
PT. HAVING PROBLEMS EXERCISING AND DOING HOUSEWORK.    Patient wants [place] of Chest done.   THIS NOTE TO DR.LIVESAY'S NURSE, LOUISE ARCHAMBAULT,RN.

## 2014-01-04 ENCOUNTER — Telehealth: Payer: Self-pay | Admitting: Oncology

## 2014-01-04 ENCOUNTER — Other Ambulatory Visit: Payer: Self-pay | Admitting: Oncology

## 2014-01-04 ENCOUNTER — Telehealth: Payer: Self-pay

## 2014-01-04 DIAGNOSIS — C50919 Malignant neoplasm of unspecified site of unspecified female breast: Secondary | ICD-10-CM

## 2014-01-04 NOTE — Telephone Encounter (Signed)
Message copied by Baruch Merl on Fri Jan 04, 2014  5:06 PM ------      Message from: Gordy Levan      Created: Fri Jan 04, 2014  1:25 PM       I cannot tell from [place] note what kind of problem she is having, and I also see that cardiology has changed some of her meds.       I think CXR 2 view would be best imaging now. POF done for this, which may also be helpful for Dr [name].             Cc Bernhardt Riemenschneider, tammi, neilsa             ------

## 2014-01-04 NOTE — Telephone Encounter (Signed)
per pof pt to go for cxr-cld & talkwd w/pt to adv to go to [employer] (walk-in) to have x-ray done before the 4/27 appt w/[facility]-pt understood

## 2014-01-04 NOTE — Telephone Encounter (Signed)
Reviewed  the information below with Ms. Akerson.  Ms. Davern will get Chest X-ray prior to Dr. Gala Lewandowsky appointment on 01-07-14.

## 2014-01-07 ENCOUNTER — Ambulatory Visit (HOSPITAL_COMMUNITY)
Admission: RE | Admit: 2014-01-07 | Discharge: 2014-01-07 | Disposition: A | Discharge status: Home / Self Care | Payer: Medicare Other | Source: Ambulatory Visit | Attending: Oncology | Admitting: Oncology

## 2014-01-07 ENCOUNTER — Encounter (INDEPENDENT_AMBULATORY_CARE_PROVIDER_SITE_OTHER): Payer: Self-pay | Admitting: Surgery

## 2014-01-07 ENCOUNTER — Ambulatory Visit (INDEPENDENT_AMBULATORY_CARE_PROVIDER_SITE_OTHER): Payer: Medicare Other | Admitting: Surgery

## 2014-01-07 VITALS — BP 110/60 | HR 76 | Temp 97.8°F | Resp 16 | Ht 62.0 in | Wt 149.0 lb

## 2014-01-07 DIAGNOSIS — R222 Localized swelling, mass and lump, trunk: Secondary | ICD-10-CM | POA: Insufficient documentation

## 2014-01-07 DIAGNOSIS — C50919 Malignant neoplasm of unspecified site of unspecified female breast: Secondary | ICD-10-CM

## 2014-01-07 DIAGNOSIS — I700 Atherosclerosis of aorta: Secondary | ICD-10-CM | POA: Insufficient documentation

## 2014-01-07 DIAGNOSIS — R0602 Shortness of breath: Secondary | ICD-10-CM | POA: Insufficient documentation

## 2014-01-07 DIAGNOSIS — M4120 Other idiopathic scoliosis, site unspecified: Secondary | ICD-10-CM | POA: Insufficient documentation

## 2014-01-07 DIAGNOSIS — Z9010 Acquired absence of unspecified breast and nipple: Secondary | ICD-10-CM | POA: Insufficient documentation

## 2014-01-07 DIAGNOSIS — J45909 Unspecified asthma, uncomplicated: Secondary | ICD-10-CM | POA: Insufficient documentation

## 2014-01-07 NOTE — Progress Notes (Signed)
General Surgery [facility]  Chief Complaint  Patient presents with  . Breast Cancer Long Term Follow Up    new mass on right chest wall - referral from Dr. Evlyn Clines    HISTORY: Patient is a 80 year old female with a history of breast carcinoma. She underwent right mastectomy for carcinoma in 2012. She had a concurrent left mastectomy with a postoperative finding of DCIS. Patient was last seen in my practice in early 2013.  Patient states that she developed a palpable small nodule on the right anterior chest wall approximately 2 months ago. It has continued to increase in size. She was seen and evaluated by her medical oncologist. She is referred for surgical excision for definitive diagnosis.  Past Medical History  Diagnosis Date  . Asthma   . Hyperlipemia   . Spinal stenosis   . Osteoporosis   . Chronic bronchitis   . Kidney stones   . DDD (degenerative disc disease)   . Osteoarthritis   . Degenerative joint disease   . Breast cancer     bilateral  . Skin cancer of face     Current Outpatient Prescriptions  Medication Sig Dispense Refill  . aspirin EC 81 MG EC tablet Take 1 tablet (81 mg total) by mouth daily.  30 tablet  0  . Calcium-Magnesium-Zinc 1000-400-15 MG TABS Take 1 tablet by mouth daily.        . celecoxib (CELEBREX) 200 MG capsule       . Cholecalciferol (VITAMIN D-3) 5000 UNITS TABS Take 1 tablet by mouth daily.       . citalopram (CELEXA) 20 MG tablet Take 1 tablet by mouth  every day for mood  90 tablet  1  . diazepam (VALIUM) 5 MG tablet Take 5 mg by mouth 3 (three) times daily as needed for anxiety.      . DULoxetine (CYMBALTA) 60 MG capsule Take 60 mg by mouth daily.      [name] ezetimibe (ZETIA) 10 MG tablet Take 10 mg by mouth daily.      . Fish Oil OIL Take 100 mg by mouth 3 (three) times daily.      . Flaxseed, Linseed, (FLAX SEED OIL PO) Take 5 mLs by mouth daily. 1 tsp daily      . HYDROcodone-acetaminophen (NORCO) 10-325 MG per  tablet Take 1 tablet by mouth every 4 (four) hours as needed. For pain      . isosorbide mononitrate (IMDUR) 30 MG 24 hr tablet Take 1 tablet (30 mg total) by mouth daily.  30 tablet  5  . mometasone (NASONEX) 50 MCG/ACT nasal spray Place 2 sprays into the nose daily.  17 g  12  . montelukast (SINGULAIR) 10 MG tablet Take 10 mg by mouth at bedtime.      . Multiple Vitamins-Minerals (MULTIVITAMINS THER. W/MINERALS) TABS Take 1 tablet by mouth daily.        . raloxifene (EVISTA) 60 MG tablet Take 60 mg by mouth daily.      . SYMBICORT 160-4.5 MCG/ACT inhaler Inhale 1 puff into the lungs daily.       No current facility-administered medications for this visit.    Allergies  Allergen Reactions  . Penicillins Rash    Family History  Problem Relation Age of Onset  . Emphysema Mother   . Heart disease Mother   . Pancreatic cancer Mother   . Cancer Mother     pancreatic  . Asthma Brother   .  Asthma Daughter   . Asthma Grandchild   . Asthma Brother     deceased  . Heart disease Father   . Heart disease Brother   . Lymphoma Brother     History   Social History  . Marital Status: Divorced    Spouse Name: N/A    Number of Children: 72  . Years of Education: N/A   Occupational History  . Retired Other    Nursing   Social History Main Topics  . Smoking status: Never Smoker   . Smokeless tobacco: Never Used  . Alcohol Use: No  . Drug Use: No  . Sexual Activity: None   Other Topics Concern  . None   Social History Narrative  . None    REVIEW OF SYSTEMS - PERTINENT POSITIVES ONLY: Cutaneous nodule right chest wall, enlarging; discomfort right supraclavicular region; "heaviness" of right upper extremity  EXAM: Filed Vitals:   01/07/14 1041  BP: 110/60  Pulse: 76  Temp: 97.8 F (36.6 C)  Resp: 16    GENERAL: well-developed, well-nourished, no acute distress HEENT: normocephalic; pupils equal and reactive; sclerae clear; dentition good; mucous membranes  moist NECK:  No palpable nodules in the thyroid bed; symmetric on extension; no palpable anterior or posterior cervical lymphadenopathy; no supraclavicular masses; no tenderness CHEST: clear to auscultation bilaterally without rales, rhonchi, or wheezes; well-healed transverse surgical incisions bilaterally; stellate nodular raised lesion on the upper skin flap right anterior chest wall measuring approximately 3 cm in greatest diameter; no palpable lymphadenopathy in either axilla CARDIAC: regular rate and rhythm without significant murmur; peripheral pulses are full EXT:  non-tender without edema; no deformity; no sign of lymphedema in the upper extremities NEURO: no gross focal deficits; no sign of tremor   LABORATORY RESULTS: See [facility] (CHL-Epic) for most recent results  RADIOLOGY RESULTS: See [facility] (CHL-Epic) for most recent results  IMPRESSION: #1 palpable cutaneous nodule right chest wall, 3 cm, concerning for recurrent breast carcinoma #2 personal history of breast carcinoma, status post bilateral mastectomy  PLAN: I discussed these findings with the patient and her friend who accompanies her today. She would like to proceed with surgical excision of the cutaneous nodule from the right chest wall in the immediate future. I explained that we would need to elevate skin flaps in order to successfully close the wound. We discussed the possibility of skin grafting if primary closure were not successful. We discussed possible wound healing problems given her history of radiation and prior surgery. She understands and wishes to proceed in the near future.  The risks and benefits of the procedure have been discussed at length with the patient.  The patient understands the proposed procedure, potential alternative treatments, and the course of recovery to be expected.  All of the patient's questions have been answered at this time.  The patient wishes to proceed with  surgery.  Earnstine Regal, MD, [facility]  Primary Care Physician: Alesia Richards, MD

## 2014-01-09 ENCOUNTER — Telehealth: Payer: Self-pay | Admitting: *Deleted

## 2014-01-09 ENCOUNTER — Other Ambulatory Visit: Payer: Self-pay | Admitting: Physician Assistant

## 2014-01-09 NOTE — Telephone Encounter (Signed)
Pt notified of results below 

## 2014-01-09 NOTE — Telephone Encounter (Signed)
Message copied by Patton Salles on Wed Jan 09, 2014 10:27 AM ------      Message from: Gordy Levan      Created: Tue Jan 08, 2014 11:41 AM       Labs seen and need follow up please let her know that the CXR looks the same as previously ------

## 2014-01-11 ENCOUNTER — Encounter (HOSPITAL_BASED_OUTPATIENT_CLINIC_OR_DEPARTMENT_OTHER): Payer: Self-pay | Admitting: *Deleted

## 2014-01-11 ENCOUNTER — Telehealth (INDEPENDENT_AMBULATORY_CARE_PROVIDER_SITE_OTHER): Payer: Self-pay | Admitting: General Surgery

## 2014-01-11 NOTE — Telephone Encounter (Signed)
Called patient to let her know that she has a follow up apt on 01-28-14, an card has been mailed to patient . And to the attn to her daughter Freda Munro. If they call back to turn to R/S. Dr. [name] is out for 3 weeks in May.

## 2014-01-14 ENCOUNTER — Encounter (HOSPITAL_BASED_OUTPATIENT_CLINIC_OR_DEPARTMENT_OTHER): Payer: Self-pay | Admitting: *Deleted

## 2014-01-14 NOTE — Progress Notes (Signed)
Pt saw dr Radford Pax 3/15 had full cardiac work up-cath stable labs and ekg stable-echo good- Daughter to bring her-states she may stay overnight-to bring all  meds and overnight bag

## 2014-01-15 ENCOUNTER — Other Ambulatory Visit: Payer: Self-pay | Admitting: Physician Assistant

## 2014-01-15 ENCOUNTER — Other Ambulatory Visit: Payer: Self-pay | Admitting: Emergency Medicine

## 2014-01-17 ENCOUNTER — Ambulatory Visit (HOSPITAL_BASED_OUTPATIENT_CLINIC_OR_DEPARTMENT_OTHER)
Admission: RE | Admit: 2014-01-17 | Discharge: 2014-01-17 | Disposition: A | Discharge status: Home / Self Care | Payer: Medicare Other | Source: Ambulatory Visit | Attending: Surgery | Admitting: Surgery

## 2014-01-17 ENCOUNTER — Telehealth (INDEPENDENT_AMBULATORY_CARE_PROVIDER_SITE_OTHER): Payer: Self-pay | Admitting: Surgery

## 2014-01-17 ENCOUNTER — Encounter: Payer: Self-pay | Admitting: Internal Medicine

## 2014-01-17 ENCOUNTER — Encounter (HOSPITAL_BASED_OUTPATIENT_CLINIC_OR_DEPARTMENT_OTHER): Payer: Self-pay

## 2014-01-17 ENCOUNTER — Other Ambulatory Visit: Payer: Self-pay

## 2014-01-17 ENCOUNTER — Telehealth: Payer: Self-pay

## 2014-01-17 ENCOUNTER — Observation Stay (HOSPITAL_BASED_OUTPATIENT_CLINIC_OR_DEPARTMENT_OTHER)
Admission: EM | Admit: 2014-01-17 | Discharge: 2014-01-21 | Disposition: A | Discharge status: Home / Self Care | Payer: Medicare Other | Attending: Cardiovascular Disease | Admitting: Cardiovascular Disease

## 2014-01-17 ENCOUNTER — Encounter (HOSPITAL_BASED_OUTPATIENT_CLINIC_OR_DEPARTMENT_OTHER)
Admission: RE | Disposition: A | Discharge status: Home / Self Care | Payer: Self-pay | Source: Ambulatory Visit | Attending: Surgery

## 2014-01-17 ENCOUNTER — Encounter (HOSPITAL_COMMUNITY): Payer: Self-pay | Admitting: Emergency Medicine

## 2014-01-17 ENCOUNTER — Emergency Department (HOSPITAL_COMMUNITY): Payer: Medicare Other

## 2014-01-17 DIAGNOSIS — F3289 Other specified depressive episodes: Secondary | ICD-10-CM | POA: Insufficient documentation

## 2014-01-17 DIAGNOSIS — I4949 Other premature depolarization: Secondary | ICD-10-CM | POA: Insufficient documentation

## 2014-01-17 DIAGNOSIS — F329 Major depressive disorder, single episode, unspecified: Secondary | ICD-10-CM | POA: Insufficient documentation

## 2014-01-17 DIAGNOSIS — C50919 Malignant neoplasm of unspecified site of unspecified female breast: Secondary | ICD-10-CM

## 2014-01-17 DIAGNOSIS — I200 Unstable angina: Secondary | ICD-10-CM

## 2014-01-17 DIAGNOSIS — J441 Chronic obstructive pulmonary disease with (acute) exacerbation: Secondary | ICD-10-CM | POA: Diagnosis present

## 2014-01-17 DIAGNOSIS — R222 Localized swelling, mass and lump, trunk: Secondary | ICD-10-CM

## 2014-01-17 DIAGNOSIS — I493 Ventricular premature depolarization: Secondary | ICD-10-CM

## 2014-01-17 DIAGNOSIS — J4489 Other specified chronic obstructive pulmonary disease: Secondary | ICD-10-CM | POA: Insufficient documentation

## 2014-01-17 DIAGNOSIS — I2510 Atherosclerotic heart disease of native coronary artery without angina pectoris: Principal | ICD-10-CM | POA: Insufficient documentation

## 2014-01-17 DIAGNOSIS — F411 Generalized anxiety disorder: Secondary | ICD-10-CM | POA: Insufficient documentation

## 2014-01-17 DIAGNOSIS — M1990 Unspecified osteoarthritis, unspecified site: Secondary | ICD-10-CM | POA: Insufficient documentation

## 2014-01-17 DIAGNOSIS — Z853 Personal history of malignant neoplasm of breast: Secondary | ICD-10-CM | POA: Insufficient documentation

## 2014-01-17 DIAGNOSIS — E785 Hyperlipidemia, unspecified: Secondary | ICD-10-CM | POA: Insufficient documentation

## 2014-01-17 DIAGNOSIS — C44599 Other specified malignant neoplasm of skin of other part of trunk: Secondary | ICD-10-CM | POA: Insufficient documentation

## 2014-01-17 DIAGNOSIS — I509 Heart failure, unspecified: Secondary | ICD-10-CM | POA: Insufficient documentation

## 2014-01-17 DIAGNOSIS — I10 Essential (primary) hypertension: Secondary | ICD-10-CM | POA: Insufficient documentation

## 2014-01-17 DIAGNOSIS — J449 Chronic obstructive pulmonary disease, unspecified: Secondary | ICD-10-CM | POA: Insufficient documentation

## 2014-01-17 DIAGNOSIS — K219 Gastro-esophageal reflux disease without esophagitis: Secondary | ICD-10-CM | POA: Insufficient documentation

## 2014-01-17 DIAGNOSIS — Z9010 Acquired absence of unspecified breast and nipple: Secondary | ICD-10-CM | POA: Insufficient documentation

## 2014-01-17 DIAGNOSIS — Z7982 Long term (current) use of aspirin: Secondary | ICD-10-CM | POA: Insufficient documentation

## 2014-01-17 DIAGNOSIS — C44509 Unspecified malignant neoplasm of skin of other part of trunk: Secondary | ICD-10-CM | POA: Insufficient documentation

## 2014-01-17 HISTORY — DX: Major depressive disorder, single episode, unspecified: F32.9

## 2014-01-17 HISTORY — DX: Atherosclerotic heart disease of native coronary artery without angina pectoris: I25.10

## 2014-01-17 HISTORY — DX: Anxiety disorder, unspecified: F41.9

## 2014-01-17 HISTORY — DX: Depression, unspecified: F32.A

## 2014-01-17 HISTORY — DX: Essential (primary) hypertension: I10

## 2014-01-17 LAB — CBC
HCT: 39.9 % (ref 36.0–46.0)
Hemoglobin: 13.1 g/dL (ref 12.0–15.0)
MCH: 30.5 pg (ref 26.0–34.0)
MCHC: 32.8 g/dL (ref 30.0–36.0)
MCV: 93 fL (ref 78.0–100.0)
PLATELETS: 177 10*3/uL (ref 150–400)
RBC: 4.29 MIL/uL (ref 3.87–5.11)
RDW: 13.5 % (ref 11.5–15.5)
WBC: 4.4 10*3/uL (ref 4.0–10.5)

## 2014-01-17 LAB — URINALYSIS, ROUTINE W REFLEX MICROSCOPIC
BILIRUBIN URINE: NEGATIVE
Glucose, UA: NEGATIVE mg/dL
Hgb urine dipstick: NEGATIVE
KETONES UR: NEGATIVE mg/dL
Nitrite: NEGATIVE
PROTEIN: NEGATIVE mg/dL
Specific Gravity, Urine: 1.009 (ref 1.005–1.030)
UROBILINOGEN UA: 0.2 mg/dL (ref 0.0–1.0)
pH: 6.5 (ref 5.0–8.0)

## 2014-01-17 LAB — CBC WITH DIFFERENTIAL/PLATELET
BASOS ABS: 0 10*3/uL (ref 0.0–0.1)
Basophils Relative: 0 % (ref 0–1)
EOS PCT: 3 % (ref 0–5)
Eosinophils Absolute: 0.2 10*3/uL (ref 0.0–0.7)
HCT: 38.9 % (ref 36.0–46.0)
Hemoglobin: 13 g/dL (ref 12.0–15.0)
Lymphocytes Relative: 42 % (ref 12–46)
Lymphs Abs: 2.3 10*3/uL (ref 0.7–4.0)
MCH: 30.4 pg (ref 26.0–34.0)
MCHC: 33.4 g/dL (ref 30.0–36.0)
MCV: 91.1 fL (ref 78.0–100.0)
MONO ABS: 0.5 10*3/uL (ref 0.1–1.0)
Monocytes Relative: 9 % (ref 3–12)
NEUTROS ABS: 2.5 10*3/uL (ref 1.7–7.7)
Neutrophils Relative %: 46 % (ref 43–77)
PLATELETS: 169 10*3/uL (ref 150–400)
RBC: 4.27 MIL/uL (ref 3.87–5.11)
RDW: 13.4 % (ref 11.5–15.5)
WBC: 5.5 10*3/uL (ref 4.0–10.5)

## 2014-01-17 LAB — BASIC METABOLIC PANEL
BUN: 9 mg/dL (ref 6–23)
BUN: 8 mg/dL (ref 6–23)
CALCIUM: 9.1 mg/dL (ref 8.4–10.5)
CO2: 27 mEq/L (ref 19–32)
CO2: 25 mEq/L (ref 19–32)
CREATININE: 0.78 mg/dL (ref 0.50–1.10)
Calcium: 9 mg/dL (ref 8.4–10.5)
Chloride: 106 mEq/L (ref 96–112)
Chloride: 107 mEq/L (ref 96–112)
Creatinine, Ser: 0.69 mg/dL (ref 0.50–1.10)
GFR calc non Af Amer: 77 mL/min — ABNORMAL LOW (ref >90)
GFR, EST AFRICAN AMERICAN: 90 mL/min — AB (ref >90)
GFR, EST NON AFRICAN AMERICAN: 81 mL/min — AB (ref >90)
Glucose, Bld: 83 mg/dL (ref 70–99)
Glucose, Bld: 102 mg/dL — ABNORMAL HIGH (ref 70–99)
Potassium: 4.1 mEq/L (ref 3.7–5.3)
Potassium: 3.9 mEq/L (ref 3.7–5.3)
SODIUM: 142 meq/L (ref 137–147)
Sodium: 142 mEq/L (ref 137–147)

## 2014-01-17 LAB — URINE MICROSCOPIC-ADD ON

## 2014-01-17 LAB — PROTIME-INR
INR: 1.04 (ref 0.00–1.49)
Prothrombin Time: 13.4 seconds (ref 11.6–15.2)

## 2014-01-17 LAB — I-STAT TROPONIN, ED
Troponin i, poc: 0 ng/mL (ref 0.00–0.08)
Troponin i, poc: 0 ng/mL (ref 0.00–0.08)

## 2014-01-17 LAB — TSH: TSH: 3.39 u[IU]/mL (ref 0.350–4.500)

## 2014-01-17 LAB — PRO B NATRIURETIC PEPTIDE: Pro B Natriuretic peptide (BNP): 163.6 pg/mL (ref 0–450)

## 2014-01-17 LAB — D-DIMER, QUANTITATIVE: D-Dimer, Quant: 0.5 ug/mL-FEU — ABNORMAL HIGH (ref 0.00–0.48)

## 2014-01-17 LAB — APTT: aPTT: 30 seconds (ref 24–37)

## 2014-01-17 SURGERY — CANCELLED PROCEDURE
Site: Chest | Laterality: Right

## 2014-01-17 MED ORDER — RALOXIFENE HCL 60 MG PO TABS
60.0000 mg | ORAL_TABLET | Freq: Every day | ORAL | Status: DC
  Administered 2014-01-17 – 2014-01-21 (×5): 60 mg via ORAL
  Filled 2014-01-17 (×5): qty 1

## 2014-01-17 MED ORDER — PINDOLOL 5 MG PO TABS
5.0000 mg | ORAL_TABLET | Freq: Two times a day (BID) | ORAL | Status: DC
  Administered 2014-01-17 – 2014-01-19 (×5): 5 mg via ORAL
  Filled 2014-01-17 (×7): qty 1

## 2014-01-17 MED ORDER — ASPIRIN EC 81 MG PO TBEC
81.0000 mg | DELAYED_RELEASE_TABLET | Freq: Every day | ORAL | Status: DC
  Administered 2014-01-18 – 2014-01-20 (×3): 81 mg via ORAL
  Filled 2014-01-17 (×4): qty 1

## 2014-01-17 MED ORDER — BUDESONIDE-FORMOTEROL FUMARATE 160-4.5 MCG/ACT IN AERO
1.0000 | INHALATION_SPRAY | Freq: Every day | RESPIRATORY_TRACT | Status: DC
  Administered 2014-01-17 – 2014-01-21 (×5): 1 via RESPIRATORY_TRACT
  Filled 2014-01-17: qty 6

## 2014-01-17 MED ORDER — ATORVASTATIN CALCIUM 20 MG PO TABS
20.0000 mg | ORAL_TABLET | Freq: Every day | ORAL | Status: DC
  Administered 2014-01-17 – 2014-01-21 (×5): 20 mg via ORAL
  Filled 2014-01-17 (×5): qty 1

## 2014-01-17 MED ORDER — CITALOPRAM HYDROBROMIDE 20 MG PO TABS
20.0000 mg | ORAL_TABLET | Freq: Every day | ORAL | Status: DC
  Administered 2014-01-17 – 2014-01-21 (×5): 20 mg via ORAL
  Filled 2014-01-17 (×5): qty 1

## 2014-01-17 MED ORDER — SODIUM CHLORIDE 0.9 % IJ SOLN
3.0000 mL | Freq: Two times a day (BID) | INTRAMUSCULAR | Status: DC
  Administered 2014-01-17 – 2014-01-20 (×6): 3 mL via INTRAVENOUS

## 2014-01-17 MED ORDER — SODIUM CHLORIDE 0.9 % IV SOLN: 250.0000 mL | INTRAVENOUS | Status: DC | PRN

## 2014-01-17 MED ORDER — ASPIRIN 300 MG RE SUPP: 300.0000 mg | RECTAL | Status: AC

## 2014-01-17 MED ORDER — HYDROCODONE-ACETAMINOPHEN 10-325 MG PO TABS
1.0000 | ORAL_TABLET | ORAL | Status: DC | PRN
  Administered 2014-01-17 (×2): 1 via ORAL
  Administered 2014-01-18 (×2): 2 via ORAL
  Administered 2014-01-18: 1 via ORAL
  Administered 2014-01-19 – 2014-01-21 (×10): 2 via ORAL
  Filled 2014-01-17 (×3): qty 2
  Filled 2014-01-17: qty 1
  Filled 2014-01-17 (×10): qty 2

## 2014-01-17 MED ORDER — HEPARIN SODIUM (PORCINE) 5000 UNIT/ML IJ SOLN
5000.0000 [IU] | Freq: Three times a day (TID) | INTRAMUSCULAR | Status: DC
  Administered 2014-01-17 – 2014-01-20 (×9): 5000 [IU] via SUBCUTANEOUS
  Filled 2014-01-17 (×14): qty 1

## 2014-01-17 MED ORDER — ASPIRIN EC 81 MG PO TBEC
81.0000 mg | DELAYED_RELEASE_TABLET | Freq: Every day | ORAL | Status: DC
  Administered 2014-01-18 – 2014-01-19 (×2): 81 mg via ORAL
  Filled 2014-01-17 (×4): qty 1

## 2014-01-17 MED ORDER — NITROGLYCERIN 0.4 MG SL SUBL: 0.4000 mg | SUBLINGUAL_TABLET | SUBLINGUAL | Status: DC | PRN

## 2014-01-17 MED ORDER — DIAZEPAM 5 MG PO TABS
5.0000 mg | ORAL_TABLET | Freq: Four times a day (QID) | ORAL | Status: DC | PRN
  Administered 2014-01-17 – 2014-01-21 (×4): 5 mg via ORAL
  Filled 2014-01-17 (×4): qty 1

## 2014-01-17 MED ORDER — ALBUTEROL SULFATE HFA 108 (90 BASE) MCG/ACT IN AERS: 2.0000 | INHALATION_SPRAY | Freq: Four times a day (QID) | RESPIRATORY_TRACT | Status: DC | PRN

## 2014-01-17 MED ORDER — ALBUTEROL SULFATE (2.5 MG/3ML) 0.083% IN NEBU
2.5000 mg | INHALATION_SOLUTION | Freq: Four times a day (QID) | RESPIRATORY_TRACT | Status: DC | PRN
  Administered 2014-01-18 – 2014-01-21 (×3): 2.5 mg via RESPIRATORY_TRACT
  Filled 2014-01-17 (×3): qty 3

## 2014-01-17 MED ORDER — MONTELUKAST SODIUM 10 MG PO TABS
10.0000 mg | ORAL_TABLET | Freq: Every day | ORAL | Status: DC
  Administered 2014-01-17 – 2014-01-21 (×5): 10 mg via ORAL
  Filled 2014-01-17 (×5): qty 1

## 2014-01-17 MED ORDER — EZETIMIBE 10 MG PO TABS
10.0000 mg | ORAL_TABLET | Freq: Every day | ORAL | Status: DC
  Administered 2014-01-17 – 2014-01-21 (×5): 10 mg via ORAL
  Filled 2014-01-17 (×5): qty 1

## 2014-01-17 MED ORDER — ASPIRIN 81 MG PO CHEW
324.0000 mg | CHEWABLE_TABLET | ORAL | Status: AC
  Administered 2014-01-17: 324 mg via ORAL
  Filled 2014-01-17: qty 4

## 2014-01-17 MED ORDER — SODIUM CHLORIDE 0.9 % IJ SOLN
3.0000 mL | INTRAMUSCULAR | Status: DC | PRN
  Administered 2014-01-19: 3 mL via INTRAVENOUS

## 2014-01-17 MED ORDER — DULOXETINE HCL 60 MG PO CPEP
60.0000 mg | ORAL_CAPSULE | Freq: Every day | ORAL | Status: DC
  Administered 2014-01-17 – 2014-01-21 (×5): 60 mg via ORAL
  Filled 2014-01-17 (×5): qty 1

## 2014-01-17 SURGICAL SUPPLY — 31 items
BENZOIN TINCTURE PRP APPL 2/3 (GAUZE/BANDAGES/DRESSINGS) IMPLANT
BLADE 15 SAFETY STRL DISP (BLADE) IMPLANT
CHLORAPREP W/TINT 26ML (MISCELLANEOUS) IMPLANT
CLEANER CAUTERY TIP 5X5 PAD (MISCELLANEOUS) IMPLANT
CLOSURE WOUND 1/2 X4 (GAUZE/BANDAGES/DRESSINGS)
COVER MAYO STAND STRL (DRAPES) IMPLANT
COVER TABLE BACK 60X90 (DRAPES) IMPLANT
DECANTER SPIKE VIAL GLASS SM (MISCELLANEOUS) IMPLANT
DRAPE PED LAPAROTOMY (DRAPES) IMPLANT
DRAPE UTILITY XL STRL (DRAPES) IMPLANT
DRSG TEGADERM 4X4.75 (GAUZE/BANDAGES/DRESSINGS) IMPLANT
ELECT REM PT RETURN 9FT ADLT (ELECTROSURGICAL)
ELECTRODE REM PT RTRN 9FT ADLT (ELECTROSURGICAL) IMPLANT
GLOVE SURG ORTHO 8.0 STRL STRW (GLOVE) IMPLANT
GOWN STRL REUS W/ TWL LRG LVL3 (GOWN DISPOSABLE) IMPLANT
GOWN STRL REUS W/ TWL XL LVL3 (GOWN DISPOSABLE) IMPLANT
GOWN STRL REUS W/TWL LRG LVL3 (GOWN DISPOSABLE)
GOWN STRL REUS W/TWL XL LVL3 (GOWN DISPOSABLE)
NEEDLE HYPO 25X1 1.5 SAFETY (NEEDLE) IMPLANT
PACK BASIN DAY SURGERY FS (CUSTOM PROCEDURE TRAY) IMPLANT
PAD CLEANER CAUTERY TIP 5X5 (MISCELLANEOUS)
PENCIL BUTTON HOLSTER BLD 10FT (ELECTRODE) IMPLANT
SHEET MEDIUM DRAPE 40X70 STRL (DRAPES) IMPLANT
SPONGE GAUZE 4X4 12PLY STER LF (GAUZE/BANDAGES/DRESSINGS) IMPLANT
STRIP CLOSURE SKIN 1/2X4 (GAUZE/BANDAGES/DRESSINGS) IMPLANT
SUT ETHILON 3 0 PS 1 (SUTURE) IMPLANT
SUT VICRYL 3-0 CR8 SH (SUTURE) IMPLANT
SUT VICRYL 4-0 PS2 18IN ABS (SUTURE) IMPLANT
SYR CONTROL 10ML LL (SYRINGE) IMPLANT
TOWEL OR 17X24 6PK STRL BLUE (TOWEL DISPOSABLE) IMPLANT
TOWEL OR NON WOVEN STRL DISP B (DISPOSABLE) IMPLANT

## 2014-01-17 NOTE — Telephone Encounter (Signed)
Message copied by Baruch Merl on Thu Jan 17, 2014  5:13 PM ------      Message from: Evlyn Clines P      Created: Wed Jan 16, 2014  6:30 PM       On my schedule 4-27, but ? Has not had biopsy yet.      Please find out from patient and/or surgery when procedure is scheduled. I am glad to see her 4-27 if needed, but otherwise seems best to move my appointment out ~ a week after surgery. Please do 45 min if available, otherwise 30.      Cc TH, LA ------

## 2014-01-17 NOTE — ED Notes (Signed)
Pt moved back to room and placed on monitor. Day surgery called to d/c pt out of their system so that we can to move the patient's name in the computer to the correct room.

## 2014-01-17 NOTE — ED Provider Notes (Signed)
Date: 01/17/2014  Rate: 67  Rhythm: normal sinus rhythm  QRS Axis: normal  Intervals: normal  ST/T Wave abnormalities: normal  Conduction Disutrbances:PACs  Narrative Interpretation:   Old EKG Reviewed: unchanged    Osvaldo Shipper, MD 01/17/14 [phone]

## 2014-01-17 NOTE — Progress Notes (Signed)
Pt was seen by me at the [facility] today in preparation for possible excision of a likely recurrent R breast cancer.  She was found to be in constant trigeminal rhythm and feeling very weak. It was also difficult to obtain a constant blood pressure.  She was able to speak to me but stated she was much weaker than usual.  Recent hospitalization at [facility] was reviewed and I spoke with Dr. Fransico Him, her cardiologist, who recommended she go to the ED for further evaluation as I felt it would be unsafe to undergo the planned anesthesia and surgical procedure today.  Dr. [name] was contacted and he agreed with deferring her. Her family was informed and agreeable to going to the ED from the [facility].

## 2014-01-17 NOTE — H&P (Addendum)
Chief Complaint:  Unstable angina  CARDIOLOGIST: Ashok Norris, MD   HPI:  This is a 80 y.o. female with a past medical history significant for moderate coronary artery disease (40-50% stenosis in the mid LAD artery by cardiac catheterization in 2012) who presented today for excisional biopsy of a right breast area a skin nodule, with a history of previous surgery for breast cancer. (Right mastectomy for DCIS, subsequent left mastectomy, 2012). In the preoperative area she was found to have very frequent PVCs often in a pattern of trigeminy and upon discussion with the anesthesiologist she describes several weeks of worsening chest discomfort and dyspnea. The decision was made to cancel surgery and she was referred to the emergency room.  She has had previous evaluations and a variety of recent adjustments in treatment for her chest discomfort. She was poorly tolerant of the right axilla. Most recently she was switched to isosorbide mononitrate, which she cannot tolerate due to severe headaches. In the past she has been poorly tolerant of metoprolol due to fatigue.  Her stamina has deteriorated over the last 2 or 3 months. She said that she used to feel great in January. Now she has to stop and rest because of dyspnea after performing breakfast related chores. She does also notice recurrent episodes of chest discomfort. These are located primarily in the left interscapular area with radiation to the left axilla and left shoulder. She believes they clearly worsen with physical activity such as walking, although physical activity does not always initiate them.. She had some cold sublingual nitroglycerin tablets at home and took one for the chest discomfort a few days ago. She developed both the headache and improvement in the chest problem.  She had coronary angiography performed on March 10 that showed no progression the previous LAD artery stenosis compared to 2012. The impression was that her pain symptoms  are noncardiac in etiology.  She has a history of frequent PVCs that dates back for a long time.   PMHx:  Past Medical History  Diagnosis Date  . Asthma   . Hyperlipemia   . Spinal stenosis   . Osteoporosis   . Chronic bronchitis   . Kidney stones   . DDD (degenerative disc disease)   . Osteoarthritis   . Degenerative joint disease   . Breast cancer     bilateral  . Skin cancer of face   . Hypertension   . Coronary artery disease     mild  . Depression   . Anxiety     Past Surgical History  Procedure Laterality Date  . Vesicovaginal fistula closure w/ tah  age 31  . Tubal ligation  1961  . Appendectomy    . Abdominal hysterectomy    . Small intestine surgery      [facility]  . Tonsillectomy    . Breast lumpectomy  2002    right breast  . Mastectomy  07/29/11    bilateral-rt nodes-none on left  . Cardiac catheterization  3/15    FAMHx:  Family History  Problem Relation Age of Onset  . Emphysema Mother   . Heart disease Mother   . Pancreatic cancer Mother   . Cancer Mother     pancreatic  . Asthma Brother   . Asthma Daughter   . Asthma Grandchild   . Asthma Brother     deceased  . Heart disease Father   . Heart disease Brother   . Lymphoma Brother     SOCHx:  reports that she has never smoked. She has never used smokeless tobacco. She reports that she drinks alcohol. She reports that she does not use illicit drugs.  ALLERGIES:  Allergies  Allergen Reactions  . Penicillins Rash    ROS: Constitutional: positive for fatigue and weight loss, negative for chills and fevers Eyes: negative Ears, nose, mouth, throat, and face: negative Respiratory: positive for dyspnea on exertion, negative for cough, hemoptysis, sputum and wheezing Cardiovascular: positive for chest pressure/discomfort and palpitations, negative for claudication, lower extremity edema, orthopnea, paroxysmal nocturnal dyspnea and syncope Gastrointestinal: negative for abdominal pain,  dysphagia, jaundice, melena, nausea and vomiting Genitourinary:negative Integument/breast: positive for breast lump and skin lesion(s) Hematologic/lymphatic: negative for bleeding, easy bruising and petechiae Musculoskeletal:positive for arthralgias and stiff joints Neurological: negative Behavioral/Psych: depression Endocrine: negative  HOME MEDS:  (Not in a hospital admission)  LABS/IMAGING: Results for orders placed during the hospital encounter of 01/17/14 (from the past 48 hour(s))  CBC WITH DIFFERENTIAL     Status: None   Collection Time    01/17/14  3:21 PM      Result Value Ref Range   WBC 5.5  4.0 - 10.5 K/uL   RBC 4.27  3.87 - 5.11 MIL/uL   Hemoglobin 13.0  12.0 - 15.0 g/dL   HCT 38.9  36.0 - 46.0 %   MCV 91.1  78.0 - 100.0 fL   MCH 30.4  26.0 - 34.0 pg   MCHC 33.4  30.0 - 36.0 g/dL   RDW 13.4  11.5 - 15.5 %   Platelets 169  150 - 400 K/uL   Neutrophils Relative % 46  43 - 77 %   Neutro Abs 2.5  1.7 - 7.7 K/uL   Lymphocytes Relative 42  12 - 46 %   Lymphs Abs 2.3  0.7 - 4.0 K/uL   Monocytes Relative 9  3 - 12 %   Monocytes Absolute 0.5  0.1 - 1.0 K/uL   Eosinophils Relative 3  0 - 5 %   Eosinophils Absolute 0.2  0.0 - 0.7 K/uL   Basophils Relative 0  0 - 1 %   Basophils Absolute 0.0  0.0 - 0.1 K/uL  BASIC METABOLIC PANEL     Status: Abnormal   Collection Time    01/17/14  3:21 PM      Result Value Ref Range   Sodium 142  137 - 147 mEq/L   Potassium 4.1  3.7 - 5.3 mEq/L   Chloride 106  96 - 112 mEq/L   CO2 27  19 - 32 mEq/L   Glucose, Bld 83  70 - 99 mg/dL   BUN 9  6 - 23 mg/dL   Creatinine, Ser 0.78  0.50 - 1.10 mg/dL   Calcium 9.1  8.4 - 10.5 mg/dL   GFR calc non Af Amer 77 (*) >90 mL/min   GFR calc Af Amer 90 (*) >90 mL/min   Comment: (NOTE)     The eGFR has been calculated using the CKD EPI equation.     This calculation has not been validated in all clinical situations.     eGFR's persistently <90 mL/min signify possible Chronic Kidney      Disease.  TSH     Status: None   Collection Time    01/17/14  3:31 PM      Result Value Ref Range   TSH 3.390  0.350 - 4.500 uIU/mL   Comment: Please note change in reference range.  PRO B NATRIURETIC PEPTIDE  Status: None   Collection Time    01/17/14  3:31 PM      Result Value Ref Range   Pro B Natriuretic peptide (BNP) 163.6  0 - 450 pg/mL  D-DIMER, QUANTITATIVE     Status: Abnormal   Collection Time    01/17/14  3:47 PM      Result Value Ref Range   D-Dimer, Quant 0.50 (*) 0.00 - 0.48 ug/mL-FEU   Comment:            AT THE INHOUSE ESTABLISHED CUTOFF     VALUE OF 0.48 ug/mL FEU,     THIS ASSAY HAS BEEN DOCUMENTED     IN THE LITERATURE TO HAVE     A SENSITIVITY AND NEGATIVE     PREDICTIVE VALUE OF AT LEAST     98 TO 99%.  THE TEST RESULT     SHOULD BE CORRELATED WITH     AN ASSESSMENT OF THE CLINICAL     PROBABILITY OF DVT / VTE.  Randolm Idol, ED     Status: None   Collection Time    01/17/14  3:48 PM      Result Value Ref Range   Troponin i, poc 0.00  0.00 - 0.08 ng/mL   Comment 3            Comment: Due to the release kinetics of cTnI,     a negative result within the first hours     of the onset of symptoms does not rule out     myocardial infarction with certainty.     If myocardial infarction is still suspected,     repeat the test at appropriate intervals.  URINALYSIS, ROUTINE W REFLEX MICROSCOPIC     Status: Abnormal   Collection Time    01/17/14  4:53 PM      Result Value Ref Range   Color, Urine YELLOW  YELLOW   APPearance CLEAR  CLEAR   Specific Gravity, Urine 1.009  1.005 - 1.030   pH 6.5  5.0 - 8.0   Glucose, UA NEGATIVE  NEGATIVE mg/dL   Hgb urine dipstick NEGATIVE  NEGATIVE   Bilirubin Urine NEGATIVE  NEGATIVE   Ketones, ur NEGATIVE  NEGATIVE mg/dL   Protein, ur NEGATIVE  NEGATIVE mg/dL   Urobilinogen, UA 0.2  0.0 - 1.0 mg/dL   Nitrite NEGATIVE  NEGATIVE   Leukocytes, UA SMALL (*) NEGATIVE  URINE MICROSCOPIC-ADD ON     Status: None    Collection Time    01/17/14  4:53 PM      Result Value Ref Range   Squamous Epithelial / LPF RARE  RARE   WBC, UA 3-6  <3 WBC/hpf   RBC / HPF 0-2  <3 RBC/hpf   Bacteria, UA RARE  RARE   Dg Chest 2 View  01/17/2014   CLINICAL DATA:  Chest pain  EXAM: CHEST  2 VIEW  COMPARISON:  DG CHEST 2 VIEW dated 01/07/2014  FINDINGS: There is elevation of the right diaphragm. There is no focal parenchymal opacity, pleural effusion, or pneumothorax. The heart and mediastinal contours are unremarkable.  There surgical clips in the right axilla.  The osseous structures are unremarkable.  IMPRESSION: No active cardiopulmonary disease.   Electronically Signed   By: Kathreen Devoid   On: 01/17/2014 16:13    VITALS: Blood pressure 111/98, pulse 80, temperature 98 F (36.7 C), temperature source Oral, resp. rate 28, SpO2 97.00%.  EXAM:  General: Alert, oriented x3, no  distress Head: no evidence of trauma, PERRL, EOMI, no exophtalmos or lid lag, no myxedema, no xanthelasma; normal ears, nose and oropharynx Neck: Normal jugular venous pulsations and no hepatojugular reflux; brisk carotid pulses without delay and no carotid bruits Chest: clear to auscultation, no signs of consolidation by percussion or palpation, normal fremitus, symmetrical and full respiratory excursions; there is a raised palpable 1-1.5 cm nodule on the anterior right chest in the area of the previously excised breast. Cardiovascular: normal position and quality of the apical impulse, regular rhythm, normal first heart sound and normal second heart sounds, no rubs or gallops, no murmur Abdomen: no tenderness or distention, no masses by palpation, no abnormal pulsatility or arterial bruits, normal bowel sounds, no hepatosplenomegaly Extremities: no clubbing, cyanosis or edema; 2+ radial, ulnar and brachial pulses bilaterally; 2+ right femoral, posterior tibial and dorsalis pedis pulses; 2+ left femoral, posterior tibial and dorsalis pedis pulses; no  subclavian or femoral bruits Neurological: grossly nonfocal   IMPRESSION/PLAN: Mrs. Angel [name] describes symptoms that are concerning for crescendo angina pectoris over the last 2-3 months, but with recent angiography that showed no progression of disease and no severe stenoses. She is planned to have a relatively small skin surgery, that is likely to be low risk, however her current symptoms have led to cancellation of that surgery.  Neither the cardiac enzymes nor the electrocardiogram showed high-risk features. Will not start anticoagulation.  Consider further evaluation for the significance of the mid LAD stenosis with either a fractional flow reserve analysis or a nuclear perfusion study. Will discuss with our interventional cardiology partners tomorrow and review the angiograms again. To me, the LAD lesion appears very mild. She has not tolerated medical management with Ranexa, metoprolol or long-acting nitrates. Will try a beta blocker with intrinsic sympathic medical activity such as pindolol, which may cause less fatigue.  There do not appear to be any high-risk features and I would advocate performing excisional breast biopsy regardless of this workup, since this is a low-risk procedure.  In the absence of ischemia and with normal LV function, the PVCs are low risk and are not a deterrent to surgery.  Sanda Klein, MD, [facility] [phone] office [phone] pager  01/17/2014, 6:50 PM

## 2014-01-17 NOTE — ED Provider Notes (Signed)
CSN: [id number]     Arrival date & time 01/17/14  1407 History   First MD Initiated Contact with Patient 01/17/14 [phone]     Chief Complaint  Patient presents with  . Chest Pain  . Abnormal ECG     (Consider location/radiation/quality/duration/timing/severity/associated sxs/prior Treatment) HPI  This is a [name], admitted by cardiology last month for unstable angina and discovered to have unchanged moderate CAD to the LAD on cath, DDD spine, HLD, h/o breast CA (planned to have resection of mass to right breast today), prior kidney stones and remote h/o "asthma", presenting from day surgery with concern for persistent symptoms of fatigue, chest pressure, EKG changes.  Chest pain is the same as has been going on for months, for which she presented last month.  Located substernally.  Persistent, but worse with exertion.  Pressure-like.  Alleviated with imdur, but this decreases her BP and makes her tired.  Radiates to LUE.  Positive for associated dyspnea, nausea.    Past Medical History  Diagnosis Date  . Asthma   . Hyperlipemia   . Spinal stenosis   . Osteoporosis   . Chronic bronchitis   . Kidney stones   . DDD (degenerative disc disease)   . Osteoarthritis   . Degenerative joint disease   . Breast cancer     bilateral  . Skin cancer of face   . Hypertension   . Coronary artery disease     mild  . Depression   . Anxiety    Past Surgical History  Procedure Laterality Date  . Vesicovaginal fistula closure w/ tah  age 89  . Tubal ligation  1961  . Appendectomy    . Abdominal hysterectomy    . Small intestine surgery      [facility]  . Tonsillectomy    . Breast lumpectomy  2002    right breast  . Mastectomy  07/29/11    bilateral-rt nodes-none on left  . Cardiac catheterization  3/15   Family History  Problem Relation Age of Onset  . Emphysema Mother   . Heart disease Mother   . Pancreatic cancer Mother   . Cancer Mother     pancreatic  . Asthma Brother   . Asthma  Daughter   . Asthma Grandchild   . Asthma Brother     deceased  . Heart disease Father   . Heart disease Brother   . Lymphoma Brother    History  Substance Use Topics  . Smoking status: Never Smoker   . Smokeless tobacco: Never Used  . Alcohol Use: Yes     Comment: occ wine   OB History   Grav Para Term Preterm Abortions TAB SAB Ect Mult Living                 Review of Systems  Constitutional: Negative for fever and chills.  HENT: Negative for facial swelling.   Eyes: Negative for photophobia and pain.  Respiratory: Positive for shortness of breath. Negative for cough.   Cardiovascular: Positive for chest pain. Negative for leg swelling.  Gastrointestinal: Positive for nausea. Negative for vomiting and abdominal pain.  Genitourinary: Negative for dysuria.  Musculoskeletal: Negative for arthralgias.  Skin: Negative for rash and wound.  Neurological: Negative for seizures.  Hematological: Negative for adenopathy.      Allergies  Penicillins  Home Medications   Prior to Admission medications   Medication Sig Start Date End Date Taking? Authorizing Provider  albuterol (PROVENTIL HFA;VENTOLIN HFA) 108 (  90 BASE) MCG/ACT inhaler Inhale into the lungs every 6 (six) hours as needed for wheezing or shortness of breath.   Yes Historical Provider, MD  aspirin EC 81 MG EC tablet Take 1 tablet (81 mg total) by mouth daily. 12/06/13  Yes Tarri Fuller, PA-C  Calcium-Magnesium-Zinc 1000-400-15 MG TABS Take 1 tablet by mouth daily.     Yes Historical Provider, MD  celecoxib (CELEBREX) 200 MG capsule Take 200 mg by mouth daily.  10/24/13  Yes Historical Provider, MD  Cholecalciferol (VITAMIN D-3) 5000 UNITS TABS Take 1 tablet by mouth daily.    Yes Historical Provider, MD  citalopram (CELEXA) 20 MG tablet Take 1 tablet by mouth  every day for mood 01/15/14  Yes Melissa R Smith, PA-C  diazepam (VALIUM) 5 MG tablet Take 5 mg by mouth every 6 (six) hours as needed for anxiety.   Yes  Historical Provider, MD  DULoxetine (CYMBALTA) 60 MG capsule Take 1 capsule by mouth  every day for mood 01/15/14  Yes Melissa R Smith, PA-C  ezetimibe (ZETIA) 10 MG tablet Take 10 mg by mouth daily.   Yes Historical Provider, MD  Fish Oil OIL Take 100 mg by mouth 3 (three) times daily.   Yes Historical Provider, MD  Flaxseed, Linseed, (FLAX SEED OIL PO) Take 5 mLs by mouth daily. 1 tsp daily   Yes Historical Provider, MD  HYDROcodone-acetaminophen (NORCO) 10-325 MG per tablet Take 1 tablet by mouth every 4 (four) hours as needed. For pain   Yes Historical Provider, MD  isosorbide mononitrate (IMDUR) 30 MG 24 hr tablet Take 1 tablet (30 mg total) by mouth daily. 01/03/14  Yes [name], MD  mometasone (NASONEX) 50 MCG/ACT nasal spray Place 2 sprays into the nose daily. 08/08/13  Yes Melissa R Smith, PA-C  montelukast (SINGULAIR) 10 MG tablet Take 10 mg by mouth at bedtime.   Yes Historical Provider, MD  Multiple Vitamins-Minerals (MULTIVITAMINS THER. W/MINERALS) TABS Take 1 tablet by mouth daily.     Yes Historical Provider, MD  raloxifene (EVISTA) 60 MG tablet Take 60 mg by mouth daily.   Yes Historical Provider, MD  SYMBICORT 160-4.5 MCG/ACT inhaler Inhale 1 puff into the lungs daily. 12/09/13  Yes Historical Provider, MD   BP 111/49  Pulse 71  Temp(Src) 97.9 F (36.6 C) (Oral)  Resp 21  SpO2 100% Physical Exam  Constitutional: She is oriented to person, place, and time. She appears well-developed and well-nourished. No distress.  HENT:  Head: Normocephalic and atraumatic.  Mouth/Throat: No oropharyngeal exudate.  Eyes: Conjunctivae are normal. Pupils are equal, round, and reactive to light. No scleral icterus.  Neck: Normal range of motion. No tracheal deviation present. No thyromegaly present.  Cardiovascular: Normal rate, regular rhythm and normal heart sounds.  Exam reveals no gallop and no friction rub.   No murmur heard. Pulmonary/Chest: Effort normal and breath sounds normal. No  stridor. No respiratory distress. She has no wheezes. She has no rales. She exhibits no tenderness.  Mastectomy site well healed, well approximated, no signs of infection  Abdominal: Soft. She exhibits no distension and no mass. There is no tenderness. There is no rebound and no guarding.  Musculoskeletal: Normal range of motion. She exhibits no edema.  Neurological: She is alert and oriented to person, place, and time.  Skin: Skin is warm and dry. She is not diaphoretic.    ED Course  Procedures (including critical care time) Labs Review Labs Reviewed  CBC WITH DIFFERENTIAL  BASIC  METABOLIC PANEL  TSH  URINALYSIS, ROUTINE W REFLEX MICROSCOPIC  D-DIMER, QUANTITATIVE  PRO B NATRIURETIC PEPTIDE  I-STAT TROPOININ, ED    Imaging Review Dg Chest 2 View  01/17/2014   CLINICAL DATA:  Chest pain  EXAM: CHEST  2 VIEW  COMPARISON:  DG CHEST 2 VIEW dated 01/07/2014  FINDINGS: There is elevation of the right diaphragm. There is no focal parenchymal opacity, pleural effusion, or pneumothorax. The heart and mediastinal contours are unremarkable.  There surgical clips in the right axilla.  The osseous structures are unremarkable.  IMPRESSION: No active cardiopulmonary disease.   Electronically Signed   By: Kathreen Devoid   On: 01/17/2014 16:13    MDM   Final diagnoses:  None    This is a [name], admitted by cardiology last month for unstable angina and discovered to have unchanged moderate CAD to the LAD on cath, DDD spine, HLD, h/o breast CA (planned to have resection of mass to right breast today), prior kidney stones and remote h/o "asthma", presenting from day surgery with concern for persistent symptoms of fatigue, chest pressure, EKG changes.  Chest pain is the same as has been going on for months, for which she presented last month.  Located substernally.  Persistent, but worse with exertion.  Pressure-like.  Alleviated with imdur, but this decreases her BP and makes her tired.  Radiates  to LUE.  Positive for associated dyspnea, nausea.    Examination reveals normal cardiovascular and pulmonary examinations. Chest is tender to palpation. Abdomen is nontender and pulses are 2+ in all 4 extremities.  At this time, I doubt PE, PTX, PNA, pericarditis, tamponade, dissection, esophageal pathology.     Date: 01/17/2014  Rate: 75  Rhythm: premature atrial contractions (PAC)  QRS Axis: normal  Intervals: normal  ST/T Wave abnormalities: normal  Conduction Disutrbances:none  Narrative Interpretation:   Old EKG Reviewed: changes noted, no PVCs noted  This is a 80 year old female, with documented coronary artery disease, history concerning for cardiac etiology.  EKG and troponin are within normal limits.  I have consulted cardiology and appreciate their recommendations. The patient remains stable this time.  Cardiology has seen the patient and is admitting the patient for cardiac catheterization and likely stent placement. The patient remains stable.  Patient is being transported in stable condition  I have discussed case and care has been guided by my attending physician, Dr. Mingo Amber.  Doy Hutching, MD 01/18/14 [phone]

## 2014-01-17 NOTE — Telephone Encounter (Signed)
Contacted by Dr. Lorrene Reid from anesthesiology with concerns over the cardiac status of this patient. Anesthesia would like to cancel the case today. I am in agreement. Dr. Al Corpus will contact the patient's cardiologist and arrange for further evaluation and management.  Surgical intervention for the recurrence of her breast cancer will occur pending cardiac assessment and recommendations.  Earnstine Regal, MD, [facility] Office: [phone]

## 2014-01-17 NOTE — ED Notes (Signed)
Pt reports left sided chest pressure x 3 months with radiation to left arm, back and neck. Reports SOB, nausea, weakness, lightheadedness. States "after two hours of activity the pain gets much worse." Pt denies pain at this time. Pt scheduled for right breast surgery this AM, sent here for abnormal EKG.

## 2014-01-18 ENCOUNTER — Inpatient Hospital Stay (HOSPITAL_COMMUNITY): Payer: Medicare Other

## 2014-01-18 DIAGNOSIS — I200 Unstable angina: Secondary | ICD-10-CM

## 2014-01-18 DIAGNOSIS — I4949 Other premature depolarization: Secondary | ICD-10-CM

## 2014-01-18 DIAGNOSIS — R079 Chest pain, unspecified: Secondary | ICD-10-CM

## 2014-01-18 LAB — TROPONIN I

## 2014-01-18 LAB — LIPID PANEL
Cholesterol: 181 mg/dL (ref 0–200)
HDL: 46 mg/dL (ref >39)
LDL Cholesterol: 113 mg/dL — ABNORMAL HIGH (ref 0–99)
Total CHOL/HDL Ratio: 3.9 RATIO
Triglycerides: 109 mg/dL (ref <150)
VLDL: 22 mg/dL (ref 0–40)

## 2014-01-18 MED ORDER — TECHNETIUM TC 99M SESTAMIBI - CARDIOLITE
30.0000 | Freq: Once | INTRAVENOUS | Status: AC | PRN
  Administered 2014-01-18: 10:00:00 30 via INTRAVENOUS

## 2014-01-18 MED ORDER — TECHNETIUM TC 99M SESTAMIBI GENERIC - CARDIOLITE
10.0000 | Freq: Once | INTRAVENOUS | Status: AC | PRN
  Administered 2014-01-18: 10 via INTRAVENOUS

## 2014-01-18 MED ORDER — REGADENOSON 0.4 MG/5ML IV SOLN
INTRAVENOUS | Status: AC
  Administered 2014-01-18: 0.4 mg
  Filled 2014-01-18: qty 5

## 2014-01-18 MED ORDER — ONDANSETRON HCL 4 MG/2ML IJ SOLN
INTRAMUSCULAR | Status: AC
  Administered 2014-01-18: 4 mg
  Filled 2014-01-18: qty 2

## 2014-01-18 NOTE — Progress Notes (Signed)
Utilization review completed.  

## 2014-01-18 NOTE — Telephone Encounter (Signed)
Pt. did not have biopsy done 01-17-14 due to cardiac status.  Pt. Currently admitted.  Dr. Marko Plume notified

## 2014-01-18 NOTE — Progress Notes (Signed)
Subjective: No CP  Objective: Vital signs in last 24 hours: Temp:  [97.8 F (36.6 C)-98.2 F (36.8 C)] 98.2 F (36.8 C) (04/24 0535) Pulse Rate:  [47-84] 59 (04/24 0840) Resp:  [11-21] 16 (04/24 0840) BP: (93-120)/(41-98) 97/56 mmHg (04/24 0840) SpO2:  [96 %-100 %] 97 % (04/24 0535) Weight:  [142 lb 13.3 oz (64.788 kg)] 142 lb 13.3 oz (64.788 kg) (04/24 0535)    Intake/Output from previous day:   Intake/Output this shift:    Medications Current Facility-Administered Medications  Medication Dose Route Frequency Provider Last Rate Last Dose  . 0.9 %  sodium chloride infusion  250 mL Intravenous PRN Lew Prout, MD      . albuterol (PROVENTIL) (2.5 MG/3ML) 0.083% nebulizer solution 2.5 mg  2.5 mg Nebulization Q6H PRN Aylissa Heinemann, MD   2.5 mg at 01/18/14 0306  . aspirin EC tablet 81 mg  81 mg Oral Daily Willam Munford, MD      . aspirin EC tablet 81 mg  81 mg Oral Daily Kirstyn Lean, MD      . atorvastatin (LIPITOR) tablet 20 mg  20 mg Oral q1800 Yatzary Merriweather, MD   20 mg at 01/17/14 2112  . budesonide-formoterol (SYMBICORT) 160-4.5 MCG/ACT inhaler 1 puff  1 puff Inhalation Daily Tayson Schnelle, MD   1 puff at 01/17/14 2200  . citalopram (CELEXA) tablet 20 mg  20 mg Oral Daily Amador Braddy, MD   20 mg at 01/17/14 2205  . diazepam (VALIUM) tablet 5 mg  5 mg Oral Q6H PRN Sanda Klein, MD   5 mg at 01/17/14 2112  . DULoxetine (CYMBALTA) DR capsule 60 mg  60 mg Oral Daily Sylvanus Telford, MD   60 mg at 01/17/14 2111  . ezetimibe (ZETIA) tablet 10 mg  10 mg Oral Daily Britain Saber, MD   10 mg at 01/17/14 2111  . heparin injection 5,000 Units  5,000 Units Subcutaneous 3 times per day Sanda Klein, MD   5,000 Units at 01/18/14 2979  . HYDROcodone-acetaminophen (NORCO) 10-325 MG per tablet 1-2 tablet  1-2 tablet Oral Q4H PRN Sanda Klein, MD   2 tablet at 01/18/14 0306  . montelukast (SINGULAIR) tablet 10 mg  10 mg Oral QHS Shambria Camerer, MD   10 mg at 01/17/14 2111    . nitroGLYCERIN (NITROSTAT) SL tablet 0.4 mg  0.4 mg Sublingual Q5 Min x 3 PRN Jeanelle Dake, MD      . pindolol (VISKEN) tablet 5 mg  5 mg Oral BID Sanda Klein, MD   5 mg at 01/17/14 2205  . raloxifene (EVISTA) tablet 60 mg  60 mg Oral Daily Hatsue Sime, MD   60 mg at 01/17/14 2112  . sodium chloride 0.9 % injection 3 mL  3 mL Intravenous Q12H Randalyn Ahmed, MD   3 mL at 01/17/14 2116  . sodium chloride 0.9 % injection 3 mL  3 mL Intravenous PRN Sanda Klein, MD        PE: General appearance: alert, cooperative and no distress Lungs: clear to auscultation bilaterally Heart: regular rate and rhythm, S1, S2 normal, no murmur, click, rub or gallop Extremities: No LEE Pulses: 2+ and symmetric Skin: Warm and dry Neurologic: Grossly normal  Lab Results:   Recent Labs  01/17/14 1521 01/17/14 1849  WBC 5.5 4.4  HGB 13.0 13.1  HCT 38.9 39.9  PLT 169 177   BMET  Recent Labs  01/17/14 1521 01/17/14 1849  NA 142 142  K 4.1 3.9  CL 106 107  CO2 27 25  GLUCOSE 83 102*  BUN 9 8  CREATININE 0.78 0.69  CALCIUM 9.1 9.0   PT/INR  Recent Labs  01/17/14 1849  LABPROT 13.4  INR 1.04   Cholesterol  Recent Labs  01/18/14 0236  CHOL 181   Lipid Panel     Component Value Date/Time   CHOL 181 01/18/2014 0236   TRIG 109 01/18/2014 0236   HDL 46 01/18/2014 0236   CHOLHDL 3.9 01/18/2014 0236   VLDL 22 01/18/2014 0236   LDLCALC 113* 01/18/2014 0236    Cardiac Panel (last 3 results)  Recent Labs  01/18/14 0236 01/18/14 0723  TROPONINI <0.30 <0.30     Assessment/Plan   Active Problems:   Unstable angina pectoris  Plan:  No MI.  SP lexiscan NST.  Nausea, headache during the test.  Zofran given.  Results to follow.  Mildly hypotensive.     LOS: 1 day    Tarri Fuller PA-C 01/18/2014 8:50 AM  I have seen and examined the patient along with Tarri Fuller PA-C.  I have reviewed the chart, notes and new data.  I agree with PA's note.  Seems to be tolerating  the first dose of pindolol with some reduction in PVC prevalence. If stress test is normal, can reschedule for excisional breast biopsy ASAP.   Sanda Klein, MD, [place] [phone] 01/18/2014, 9:22 AM

## 2014-01-18 NOTE — ED Provider Notes (Signed)
I saw and evaluated the patient, reviewed the resident's note and I agree with the findings and plan.   EKG Interpretation None      Patient sent from OR for PACs, PVCs. Had right breast mass surgery scheduled. History of this happening one month ago, had By cardiology without intervention this time. Patient here chest pain-free relaxing comfortably. EKG unchanged. See other note for EKG interpretation, unable to get across into the system. Cardiology to evaluate, they will admit.   Osvaldo Shipper, MD 01/18/14 [phone]

## 2014-01-19 ENCOUNTER — Encounter (HOSPITAL_COMMUNITY): Payer: Self-pay | Admitting: Nurse Practitioner

## 2014-01-19 DIAGNOSIS — R5381 Other malaise: Secondary | ICD-10-CM

## 2014-01-19 DIAGNOSIS — R5383 Other fatigue: Secondary | ICD-10-CM

## 2014-01-19 LAB — URINALYSIS, ROUTINE W REFLEX MICROSCOPIC
Bilirubin Urine: NEGATIVE
Glucose, UA: NEGATIVE mg/dL
Hgb urine dipstick: NEGATIVE
Ketones, ur: NEGATIVE mg/dL
NITRITE: NEGATIVE
PROTEIN: NEGATIVE mg/dL
SPECIFIC GRAVITY, URINE: 1.012 (ref 1.005–1.030)
Urobilinogen, UA: 0.2 mg/dL (ref 0.0–1.0)
pH: 6.5 (ref 5.0–8.0)

## 2014-01-19 LAB — URINE MICROSCOPIC-ADD ON

## 2014-01-19 MED ORDER — PINDOLOL 5 MG PO TABS: 5.0000 mg | ORAL_TABLET | Freq: Two times a day (BID) | ORAL | Status: DC

## 2014-01-19 MED ORDER — ATORVASTATIN CALCIUM 10 MG PO TABS: 10.0000 mg | ORAL_TABLET | Freq: Every day | ORAL | Status: DC

## 2014-01-19 MED ORDER — NITROGLYCERIN 0.4 MG SL SUBL: 0.4000 mg | SUBLINGUAL_TABLET | SUBLINGUAL | Status: DC | PRN

## 2014-01-19 MED ORDER — POLYETHYLENE GLYCOL 3350 17 G PO PACK
17.0000 g | PACK | Freq: Every day | ORAL | Status: DC
  Administered 2014-01-19 – 2014-01-20 (×2): 17 g via ORAL
  Filled 2014-01-19 (×3): qty 1

## 2014-01-19 NOTE — Discharge Summary (Addendum)
Discharge Summary   Patient ID: Angel [name],  MRN: [id number], DOB/AGE: August 15, 1932 80 y.o.  Admit date: 01/17/2014 Discharge date: 01/21/2014  Primary Care Provider: [name] Angel [name] Primary Cardiologist: T. Turner, MD   Discharge Diagnoses Principal Problem:   Unstable angina pectoris  **Non-ischemic myoview this admission.  Active Problems:   PVC's (premature ventricular contractions)  **Pindolol started this admission but then discontinued 2/2 malaise.   Nodule of chest wall, right  **Status-post biopsy this admission.   Hyperlipemia   Osteoarthritis   COPD  Allergies Allergies  Allergen Reactions  . Adhesive [Tape]   . Penicillins Rash   Procedures  Lexiscan Cardiolite 4.24.2015  IMPRESSION: 1. No evidence of inducible ischemia. 2. Normal cardiac wall motion. 3. Calculated ejection fraction 74%. 4. Mildly decreased radiotracer uptake in the ventricular septum on all images suggest the possibility of a region of prior infarct/scar. Alternately, the patient has a known bundle branch block this could be artifactual. _____________   History of Present Illness  80 year old female with prior history of moderate, nonobstructive coronary artery disease by catheterization in 2012. She also has a history of bilateral breast cancer status post bilateral mastectomies in 2012. She was recently found to have a nodule of the right chest wall and was scheduled for biopsy 4 April 23. Preoperatively, she was noted to have frequent PVCs and trigeminy and cardiology was consulted. Upon further questioning, patient reported a 2-3 month history of progressive fatigue as well as dyspnea and exertional chest discomfort. Decision was made to admit her for further evaluation.  Hospital Course  The patient ruled out for myocardial infarction. She was placed on low-dose beta blocker therapy with improvement in her PVC burden. Given her exertional dyspnea and chest discomfort,  decision was made to pursue a lexiscan myoview. This was performed on April 24, revealing normal LV function without evidence of inducible ischemia.  On the morning of 4/25, Angel [name] complained of generalized malaise.  She also developed a low grade fever of 101.  Fever resolved and UA/CXR were non-acute.  WBC remained stable.  She continued to have malaise and her blood pressures were soft.  As a result her pindolol was discontinued.  With this change, her malaise resolved, though she again had greater asymptomatic PVC burden and occasional ventricular trigeminy, which we will no manage conservatively.  Since Angel [name] remained in the hospital over the weekend, we contacted general surgery and were able to reschedule her chest wall biopsy for earlier this morning.  She tolerated the biopsy well and was felt to be stable for discharge home for a surgical standpoint.  As a result, she will be discharged home this afternoon in good condition.  Discharge Vitals Blood pressure 112/56, pulse 69, temperature 97.6 F (36.4 C), temperature source Oral, resp. rate 21, height 5\' 2"  (1.575 m), weight 147 lb 4.8 oz (66.815 kg), SpO2 92.00%.  Filed Weights   01/17/14 1954 01/18/14 0535 01/21/14 0500  Weight: 142 lb 13.3 oz (64.788 kg) 142 lb 13.3 oz (64.788 kg) 147 lb 4.8 oz (66.815 kg)   Labs  CBC  Recent Labs  01/20/14 0415  WBC 5.0  HGB 11.9*  HCT 37.2  MCV 94.9  PLT 938   Basic Metabolic Panel  Recent Labs  01/20/14 0415  NA 144  K 5.0  CL 105  CO2 29  GLUCOSE 99  BUN 8  CREATININE 0.70  CALCIUM 8.9   Cardiac Enzymes Lab Results  Component Value Date   TROPONINI <0.30  01/18/2014    Fasting Lipid Panel Lab Results  Component Value Date   CHOL 181 01/18/2014   HDL 46 01/18/2014   LDLCALC 113* 01/18/2014   TRIG 109 01/18/2014   CHOLHDL 3.9 01/18/2014    Thyroid Function Tests Lab Results  Component Value Date   TSH 3.390 01/17/2014   Disposition  Pt is being  discharged home today in good condition.  Follow-up Plans & Appointments      Follow-up Information   Follow up with Angel Richards, MD On 02/20/2014. (10:00 AM)    Specialty:  Internal Medicine   Contact information:   [address] [place] [address] [phone]       Follow up with Angel Margarita, MD On 03/20/2014. (2:15 PM)    Specialty:  Cardiology   Contact information:   [address] [place] [address] [phone]       Follow up with Angel Levan, MD On 01/21/2014. (1:30 PM)    Specialty:  Oncology   Contact information:   [place] [address] [phone]       Follow up with Angel Regal, MD On 01/28/2014. (3:45)    Specialty:  General Surgery   Contact information:   [place] [address] [phone]      Discharge Medications    Medication List         acetaminophen 325 MG tablet  Commonly known as:  TYLENOL  Take 1-2 tablets (325-650 mg total) by mouth every 4 (four) hours as needed for mild pain (for pain score up to 6 or for headache).     albuterol 108 (90 BASE) MCG/ACT inhaler  Commonly known as:  PROVENTIL HFA;VENTOLIN HFA  Inhale into the lungs every 6 (six) hours as needed for wheezing or shortness of breath.     aspirin 81 MG EC tablet  Take 1 tablet (81 mg total) by mouth daily.     atorvastatin 10 MG tablet  Commonly known as:  LIPITOR  Take 1 tablet (10 mg total) by mouth daily at 6 PM.     Calcium-Magnesium-Zinc 1000-400-15 MG Tabs  Take 1 tablet by mouth daily.     celecoxib 200 MG capsule  Commonly known as:  CELEBREX  Take 200 mg by mouth daily.     citalopram 20 MG tablet  Commonly known as:  CELEXA  Take 1 tablet by mouth  every day for mood     diazepam 5 MG tablet  Commonly known as:  VALIUM  Take 5 mg by mouth every 6 (six) hours as needed for anxiety.     DULoxetine 60 MG capsule  Commonly known as:  CYMBALTA  Take 1  capsule by mouth  every day for mood     ezetimibe 10 MG tablet  Commonly known as:  ZETIA  Take 10 mg by mouth daily.     Fish Oil Oil  Take 100 mg by mouth 3 (three) times daily.     FLAX SEED OIL PO  Take 5 mLs by mouth daily. 1 tsp daily     HYDROcodone-acetaminophen 10-325 MG per tablet  Commonly known as:  NORCO  Take 1 tablet by mouth every 4 (four) hours as needed. For pain     isosorbide mononitrate 30 MG 24 hr tablet  Commonly known as:  IMDUR  Take 1 tablet (30 mg total) by mouth daily.     mometasone 50 MCG/ACT nasal  spray  Commonly known as:  NASONEX  Place 2 sprays into the nose daily.     montelukast 10 MG tablet  Commonly known as:  SINGULAIR  Take 10 mg by mouth at bedtime.     multivitamins ther. w/minerals Tabs tablet  Take 1 tablet by mouth daily.     nitroGLYCERIN 0.4 MG SL tablet  Commonly known as:  NITROSTAT  Place 1 tablet (0.4 mg total) under the tongue every 5 (five) minutes x 3 doses as needed for chest pain.     raloxifene 60 MG tablet  Commonly known as:  EVISTA  Take 60 mg by mouth daily.     SYMBICORT 160-4.5 MCG/ACT inhaler  Generic drug:  budesonide-formoterol  Inhale 1 puff into the lungs daily.     Vitamin D-3 5000 UNITS Tabs  Take 1 tablet by mouth daily.       Outstanding Labs/Studies  None  Duration of Discharge Encounter   Greater than 30 minutes including physician time.  Signed, Rogelia Mire NP 01/21/2014, 1:00 PM

## 2014-01-19 NOTE — Progress Notes (Signed)
Informed by RN that pt was not feeling well, general malaise.  She did have a fever earlier this AM, with a peak of 101.1.   She received vicodin after that time (containing tylenol) and has had no recurrence of fever.  I examined pt, and she simply complained of malaise and fatigue.  No fevers or chills.  Exam unchanged from earlier today - Cor rrr, lungs CTA, abd soft, nt, nd, bs+x4.  UA sent and nl.  CXR from 4/23 reviewed - no acute findings.  We will plan to hold off on d/c today and have encouraged to rest this afternoon.  F/U cbc, bmet.  Anticipate d/c in AM.

## 2014-01-19 NOTE — Discharge Instructions (Signed)
***  PLEASE REMEMBER TO BRING ALL OF YOUR MEDICATIONS TO EACH OF YOUR FOLLOW-UP OFFICE VISITS.  

## 2014-01-20 ENCOUNTER — Inpatient Hospital Stay (HOSPITAL_COMMUNITY): Payer: Medicare Other

## 2014-01-20 DIAGNOSIS — I200 Unstable angina: Secondary | ICD-10-CM | POA: Diagnosis present

## 2014-01-20 LAB — CBC
HEMATOCRIT: 37.2 % (ref 36.0–46.0)
HEMOGLOBIN: 11.9 g/dL — AB (ref 12.0–15.0)
MCH: 30.4 pg (ref 26.0–34.0)
MCHC: 32 g/dL (ref 30.0–36.0)
MCV: 94.9 fL (ref 78.0–100.0)
Platelets: 171 10*3/uL (ref 150–400)
RBC: 3.92 MIL/uL (ref 3.87–5.11)
RDW: 13.4 % (ref 11.5–15.5)
WBC: 5 10*3/uL (ref 4.0–10.5)

## 2014-01-20 LAB — BASIC METABOLIC PANEL
BUN: 8 mg/dL (ref 6–23)
CALCIUM: 8.9 mg/dL (ref 8.4–10.5)
CO2: 29 mEq/L (ref 19–32)
Chloride: 105 mEq/L (ref 96–112)
Creatinine, Ser: 0.7 mg/dL (ref 0.50–1.10)
GFR calc Af Amer: >90 mL/min (ref >90)
GFR calc non Af Amer: 80 mL/min — ABNORMAL LOW (ref >90)
Glucose, Bld: 99 mg/dL (ref 70–99)
Potassium: 5 mEq/L (ref 3.7–5.3)
Sodium: 144 mEq/L (ref 137–147)

## 2014-01-20 MED ORDER — FLEET ENEMA 7-19 GM/118ML RE ENEM
1.0000 | ENEMA | Freq: Every day | RECTAL | Status: DC | PRN
  Administered 2014-01-20: 16:00:00 via RECTAL
  Filled 2014-01-20 (×2): qty 1

## 2014-01-20 NOTE — Progress Notes (Signed)
Patient is feeling better but was not feeling well earlier and we stopped her pindolol. Her blood pressure has still been soft. Her chest x-ray did not show an infiltrate. I opted sent she lives alone and was not feeling well with soft blood pressures to keep her here for observation and discontinue pindolol. I asked general surgery to see if they can get the biopsy done tomorrow.  Kerry Hough MD [facility]

## 2014-01-20 NOTE — Progress Notes (Signed)
Subjective:  Her discharge yesterday was canceled because she'll isolate temperature of 101 and does not feel well. She had no recurrent temperature are all within yesterday and she had a normal white count a negative urine. She complains of a mild cough and states that her low back aches but that her back normally will ache some. She is anxious about her blood pressure which tends to be somewhat soft on the pindolol.  Objective:  Vital Signs in the last 24 hours: BP 95/42  Pulse 53  Temp(Src) 98.2 F (36.8 C) (Oral)  Resp 16  Ht 5\' 2"  (1.575 m)  Wt 64.788 kg (142 lb 13.3 oz)  BMI 26.12 kg/m2  SpO2 93%  Physical Exam: Then somewhat anxious elderly white female in no acute distress Lungs:  Clear  Cardiac:  Regular rhythm, normal S1 and S2, no S3 Abdomen:  Soft, nontender, no masses Extremities:  No edema present  Intake/Output from previous day: 04/25 0701 - 04/26 0700 In: 720 [P.O.:720] Out: -  Weight Filed Weights   01/17/14 1954 01/18/14 0535  Weight: 64.788 kg (142 lb 13.3 oz) 64.788 kg (142 lb 13.3 oz)    Lab Results: Basic Metabolic Panel:  Recent Labs  01/17/14 1849 01/20/14 0415  NA 142 144  K 3.9 5.0  CL 107 105  CO2 25 29  GLUCOSE 102* 99  BUN 8 8  CREATININE 0.69 0.70    CBC:  Recent Labs  01/17/14 1521 01/17/14 1849 01/20/14 0415  WBC 5.5 4.4 5.0  NEUTROABS 2.5  --   --   HGB 13.0 13.1 11.9*  HCT 38.9 39.9 37.2  MCV 91.1 93.0 94.9  PLT 169 177 171    BNP    Component Value Date/Time   PROBNP 163.6 01/17/2014 1531    PROTIME: Lab Results  Component Value Date   INR 1.04 01/17/2014   INR 1.02 12/05/2013   INR 0.99 07/26/2011    Telemetry: Sinus rhythm  Assessment/Plan:  1. Isolated elevation of temperature with unremarkable exam and not much seen in the urine. At this point I will obtain a repeat chest x-ray today. 2. Somewhat soft blood pressures in a patient recently started on beta blocker  Recommendations:  She is  anxious about going home as she lives alone. Am going to check a repeat chest x-ray and hold the pindolol. May be possible to do the biopsy tomorrow so may ask the surgeons to relook at her today.     Kerry Hough  MD [facility]  01/20/2014, 8:36 AM

## 2014-01-20 NOTE — Progress Notes (Signed)
Patient ID: Angel [name], female   DOB: [birth year], 80 y.o.   MRN: [id number] Patient was scheduled for BX of R chest wall mass by Dr. [name] last week. Mass is concerning for recurrent breast cancer. She had some cardiac issues and her surgery was cancelled last week and she was admitted to cardiology. They have finished her work-up and have cleared her for surgery. Dr. Nile Dear asked if the [place] can be done tomorrow since she is here. I discussed it with her including risks and benefits and we plan to have Dr. Rosendo Gros do it tomorrow. She is agreeable. NPO after MN. Georganna Skeans, MD, MPH, FACS Trauma: [phone] General Surgery: [phone]

## 2014-01-21 ENCOUNTER — Observation Stay (HOSPITAL_COMMUNITY): Payer: Medicare Other | Admitting: Anesthesiology

## 2014-01-21 ENCOUNTER — Other Ambulatory Visit: Payer: 59

## 2014-01-21 ENCOUNTER — Encounter (HOSPITAL_COMMUNITY)
Admission: EM | Disposition: A | Discharge status: Home / Self Care | Payer: Self-pay | Source: Home / Self Care | Attending: Cardiovascular Disease

## 2014-01-21 ENCOUNTER — Encounter (HOSPITAL_COMMUNITY): Payer: Medicare Other | Admitting: Anesthesiology

## 2014-01-21 ENCOUNTER — Encounter (HOSPITAL_COMMUNITY): Payer: Self-pay | Admitting: Certified Registered Nurse Anesthetist

## 2014-01-21 ENCOUNTER — Ambulatory Visit: Payer: 59 | Admitting: Oncology

## 2014-01-21 DIAGNOSIS — C50919 Malignant neoplasm of unspecified site of unspecified female breast: Secondary | ICD-10-CM

## 2014-01-21 HISTORY — PX: BREAST BIOPSY: SHX20

## 2014-01-21 SURGERY — BREAST BIOPSY
Anesthesia: General | Laterality: Right

## 2014-01-21 MED ORDER — ACETAMINOPHEN 160 MG/5ML PO SOLN
325.0000 mg | ORAL | Status: DC | PRN
  Filled 2014-01-21: qty 20.3

## 2014-01-21 MED ORDER — MIDAZOLAM HCL 5 MG/5ML IJ SOLN
INTRAMUSCULAR | Status: DC | PRN
  Administered 2014-01-21: 1 mg via INTRAVENOUS

## 2014-01-21 MED ORDER — 0.9 % SODIUM CHLORIDE (POUR BTL) OPTIME
TOPICAL | Status: DC | PRN
  Administered 2014-01-21: 1000 mL

## 2014-01-21 MED ORDER — ACETAMINOPHEN 325 MG PO TABS: 325.0000 mg | ORAL_TABLET | ORAL | Status: DC | PRN

## 2014-01-21 MED ORDER — PROPOFOL 10 MG/ML IV BOLUS
INTRAVENOUS | Status: AC
  Filled 2014-01-21: qty 20

## 2014-01-21 MED ORDER — FENTANYL CITRATE 0.05 MG/ML IJ SOLN
INTRAMUSCULAR | Status: AC
  Filled 2014-01-21: qty 5

## 2014-01-21 MED ORDER — FENTANYL CITRATE 0.05 MG/ML IJ SOLN: 25.0000 ug | INTRAMUSCULAR | Status: DC | PRN

## 2014-01-21 MED ORDER — LACTATED RINGERS IV SOLN
INTRAVENOUS | Status: DC
  Administered 2014-01-21: 10:00:00 via INTRAVENOUS

## 2014-01-21 MED ORDER — LIDOCAINE-EPINEPHRINE (PF) 1 %-1:200000 IJ SOLN
INTRAMUSCULAR | Status: DC | PRN
  Administered 2014-01-21: 10 mL

## 2014-01-21 MED ORDER — ONDANSETRON HCL 4 MG/2ML IJ SOLN
INTRAMUSCULAR | Status: AC
  Filled 2014-01-21: qty 2

## 2014-01-21 MED ORDER — LIDOCAINE HCL (CARDIAC) 20 MG/ML IV SOLN
INTRAVENOUS | Status: DC | PRN
  Administered 2014-01-21: 60 mg via INTRAVENOUS

## 2014-01-21 MED ORDER — PHENYLEPHRINE HCL 10 MG/ML IJ SOLN
INTRAMUSCULAR | Status: DC | PRN
  Administered 2014-01-21: 40 ug via INTRAVENOUS

## 2014-01-21 MED ORDER — ROCURONIUM BROMIDE 50 MG/5ML IV SOLN
INTRAVENOUS | Status: AC
  Filled 2014-01-21: qty 1

## 2014-01-21 MED ORDER — LIDOCAINE HCL (CARDIAC) 20 MG/ML IV SOLN
INTRAVENOUS | Status: AC
  Filled 2014-01-21: qty 5

## 2014-01-21 MED ORDER — VANCOMYCIN HCL IN DEXTROSE 1-5 GM/200ML-% IV SOLN
1000.0000 mg | INTRAVENOUS | Status: AC
  Administered 2014-01-21: 1000 mg via INTRAVENOUS
  Filled 2014-01-21 (×2): qty 200

## 2014-01-21 MED ORDER — PROPOFOL 10 MG/ML IV BOLUS
INTRAVENOUS | Status: DC | PRN
  Administered 2014-01-21: 100 mg via INTRAVENOUS

## 2014-01-21 MED ORDER — OXYCODONE HCL 5 MG PO TABS: 5.0000 mg | ORAL_TABLET | Freq: Once | ORAL | Status: DC | PRN

## 2014-01-21 MED ORDER — OXYCODONE HCL 5 MG/5ML PO SOLN: 5.0000 mg | Freq: Once | ORAL | Status: DC | PRN

## 2014-01-21 MED ORDER — MIDAZOLAM HCL 2 MG/2ML IJ SOLN
INTRAMUSCULAR | Status: AC
  Filled 2014-01-21: qty 2

## 2014-01-21 MED ORDER — ONDANSETRON HCL 4 MG/2ML IJ SOLN: 4.0000 mg | Freq: Once | INTRAMUSCULAR | Status: DC | PRN

## 2014-01-21 MED ORDER — ONDANSETRON HCL 4 MG/2ML IJ SOLN
INTRAMUSCULAR | Status: DC | PRN
  Administered 2014-01-21: 4 mg via INTRAVENOUS

## 2014-01-21 MED ORDER — LACTATED RINGERS IV SOLN
INTRAVENOUS | Status: DC | PRN
  Administered 2014-01-21: 11:00:00 via INTRAVENOUS

## 2014-01-21 MED ORDER — BUPIVACAINE HCL (PF) 0.25 % IJ SOLN
INTRAMUSCULAR | Status: AC
Start: 2014-01-21 — End: 2014-01-21
  Filled 2014-01-21: qty 30

## 2014-01-21 SURGICAL SUPPLY — 44 items
BINDER BREAST LRG (GAUZE/BANDAGES/DRESSINGS) IMPLANT
BINDER BREAST XLRG (GAUZE/BANDAGES/DRESSINGS) IMPLANT
BLADE 10 SAFETY STRL DISP (BLADE) ×4 IMPLANT
BLADE 15 SAFETY STRL DISP (BLADE) ×2 IMPLANT
CHLORAPREP W/TINT 26ML (MISCELLANEOUS) ×2 IMPLANT
COVER SURGICAL LIGHT HANDLE (MISCELLANEOUS) ×2 IMPLANT
DECANTER SPIKE VIAL GLASS SM (MISCELLANEOUS) ×4 IMPLANT
DERMABOND ADVANCED (GAUZE/BANDAGES/DRESSINGS) ×1
DERMABOND ADVANCED .7 DNX12 (GAUZE/BANDAGES/DRESSINGS) ×1 IMPLANT
DRAPE PED LAPAROTOMY (DRAPES) ×2 IMPLANT
DRAPE UTILITY 15X26 W/TAPE STR (DRAPE) ×4 IMPLANT
ELECT CAUTERY BLADE 6.4 (BLADE) ×2 IMPLANT
ELECT REM PT RETURN 9FT ADLT (ELECTROSURGICAL) ×2
ELECTRODE REM PT RTRN 9FT ADLT (ELECTROSURGICAL) ×1 IMPLANT
GAUZE SPONGE 4X4 16PLY XRAY LF (GAUZE/BANDAGES/DRESSINGS) ×2 IMPLANT
GLOVE BIO SURGEON STRL SZ7.5 (GLOVE) ×2 IMPLANT
GLOVE BIOGEL PI IND STRL 8 (GLOVE) ×1 IMPLANT
GLOVE BIOGEL PI INDICATOR 8 (GLOVE) ×1
GOWN STRL REUS W/ TWL LRG LVL3 (GOWN DISPOSABLE) ×2 IMPLANT
GOWN STRL REUS W/ TWL XL LVL3 (GOWN DISPOSABLE) ×1 IMPLANT
GOWN STRL REUS W/TWL LRG LVL3 (GOWN DISPOSABLE) ×2
GOWN STRL REUS W/TWL XL LVL3 (GOWN DISPOSABLE) ×1
KIT BASIN OR (CUSTOM PROCEDURE TRAY) ×2 IMPLANT
KIT ROOM TURNOVER OR (KITS) ×2 IMPLANT
NEEDLE HYPO 25GX1X1/2 BEV (NEEDLE) ×2 IMPLANT
NS IRRIG 1000ML POUR BTL (IV SOLUTION) ×2 IMPLANT
PACK SURGICAL SETUP 50X90 (CUSTOM PROCEDURE TRAY) ×2 IMPLANT
PAD ARMBOARD 7.5X6 YLW CONV (MISCELLANEOUS) ×2 IMPLANT
PENCIL BUTTON HOLSTER BLD 10FT (ELECTRODE) ×2 IMPLANT
SPONGE GAUZE 4X4 12PLY STER LF (GAUZE/BANDAGES/DRESSINGS) ×2 IMPLANT
SPONGE LAP 4X18 X RAY DECT (DISPOSABLE) ×2 IMPLANT
SUT ETHILON 3 0 PS 1 (SUTURE) ×2 IMPLANT
SUT MNCRL AB 4-0 PS2 18 (SUTURE) ×2 IMPLANT
SUT SILK 3 0 SH 30 (SUTURE) ×2 IMPLANT
SUT VIC AB 3-0 SH 27 (SUTURE) ×1
SUT VIC AB 3-0 SH 27XBRD (SUTURE) ×1 IMPLANT
SUT VIC AB 3-0 SH 8-18 (SUTURE) ×2 IMPLANT
SYR BULB 3OZ (MISCELLANEOUS) ×2 IMPLANT
SYR CONTROL 10ML LL (SYRINGE) ×2 IMPLANT
TAPE CLOTH SURG 4X10 WHT LF (GAUZE/BANDAGES/DRESSINGS) ×2 IMPLANT
TOWEL OR 17X24 6PK STRL BLUE (TOWEL DISPOSABLE) ×2 IMPLANT
TOWEL OR 17X26 10 PK STRL BLUE (TOWEL DISPOSABLE) ×2 IMPLANT
TUBE CONNECTING 12X1/4 (SUCTIONS) ×2 IMPLANT
YANKAUER SUCT BULB TIP NO VENT (SUCTIONS) ×2 IMPLANT

## 2014-01-21 NOTE — Anesthesia Postprocedure Evaluation (Signed)
  Anesthesia Post-op Note  Patient: Angel [name]  Procedure(s) Performed: Procedure(s): BREAST/CHEST WALL BIOPSY (Right)  Patient Location: PACU  Anesthesia Type:General  Level of Consciousness: awake and alert   Airway and Oxygen Therapy: Patient Spontanous Breathing  Post-op Pain: mild  Post-op Assessment: Post-op Vital signs reviewed, Patient's Cardiovascular Status Stable, Respiratory Function Stable, Patent Airway, No signs of Nausea or vomiting and Pain level controlled  Post-op Vital Signs: Reviewed and stable  Last Vitals:  Filed Vitals:   01/21/14 1147  BP: 112/56  Pulse: 69  Temp: 36.4 C  Resp:     Complications: No apparent anesthesia complications

## 2014-01-21 NOTE — Op Note (Signed)
01/17/2014 - 01/21/2014  11:25 AM  PATIENT:  Angel [name]  80 y.o. female  PRE-OPERATIVE DIAGNOSIS:  Right chest wall Mass  POST-OPERATIVE DIAGNOSIS:  Right chest wall Mass  PROCEDURE:  Procedure(s): BREAST/CHEST WALL BIOPSY (Right)  SURGEON:  Surgeon(s) and Role:    * [name], MD - Primary  PHYSICIAN ASSISTANT:   ASSISTANTS: none   ANESTHESIA:   local and general  EBL:     BLOOD ADMINISTERED:none  DRAINS: none   LOCAL MEDICATIONS USED:  BUPIVICAINE   SPECIMEN:  Source of Specimen:  R chest wall  DISPOSITION OF SPECIMEN:  PATHOLOGY  COUNTS:  YES  TOURNIQUET:  * No tourniquets in log *  DICTATION: .Dragon Dictation The patient was taken to the OR and placed in the supine position with bilateral SCDs in place.  She was prepped and draped in the usual sterile fashion.  A timeout was called adn all facts were verified.  A 2x3 cm elliptical incision was made around the chest wall mass in an oblique fashion.  Dissection was taken down to the fascia of the pectoralis.  There was some firmness of the chest wall muscle.  A superficial portion of the pectoralis muscle was also harvested with the specimen.  There appeared to be no gross disease left behind.  This was sent off to pathology in sterile saline.  The specimen measured 2x3cm.  The area was checked for hemostasis which was achieved with electrocautery. Minimal amount of flaps were raised circumfrentially to help oppose the wound ends.  3-0 vicryl interrupted stitches were used to reapproximate the deep dermal layer.  3-0 nylons were than used in a horizontal fashion to reapproximate the skin.  The wound was dressed with 4x4s and tape.  She tolerated teh procedure well.  She was sent to the PACU in stable condition.  PLAN OF CARE: Already admitted  PATIENT DISPOSITION:  PACU - hemodynamically stable.   Delay start of Pharmacological VTE agent (>24hrs) due to surgical blood loss or risk of bleeding: not  applicable

## 2014-01-21 NOTE — Discharge Summary (Signed)
Agree with discharge summary as stated above by Ignacia Bayley NP

## 2014-01-21 NOTE — Transfer of Care (Signed)
Immediate Anesthesia Transfer of Care Note  Patient: Angel [name]  Procedure(s) Performed: Procedure(s): BREAST/CHEST WALL BIOPSY (Right)  Patient Location: PACU  Anesthesia Type:General  Level of Consciousness: awake, alert  and oriented  Airway & Oxygen Therapy: Patient Spontanous Breathing and Patient connected to nasal cannula oxygen  Post-op Assessment: Report given to PACU RN and Post -op Vital signs reviewed and stable  Post vital signs: Reviewed and stable  Complications: No apparent anesthesia complications

## 2014-01-21 NOTE — Progress Notes (Signed)
Subjective: Pt doing well, anxious for surgery.    Objective: Vital signs in last 24 hours: Temp:  [98.2 F (36.8 C)] 98.2 F (36.8 C) (04/27 0500) Pulse Rate:  [69] 69 (04/27 0500) Resp:  [16] 16 (04/27 0500) BP: (101)/(61) 101/61 mmHg (04/27 0500) SpO2:  [92 %-95 %] 95 % (04/27 0500) Weight:  [147 lb 4.8 oz (66.815 kg)] 147 lb 4.8 oz (66.815 kg) (04/27 0500) Last BM Date: 01/20/14  Intake/Output from previous day: 04/26 0701 - 04/27 0700 In: 243 [P.O.:240; I.V.:3] Out: -  Intake/Output this shift:    PE: Gen:  Alert, NAD, pleasant Right breast:  Right upper outer chest wall-1cm central ulcerative nodule with peri-nodule erythema/edema up to 3cm  Lab Results:   Recent Labs  01/20/14 0415  WBC 5.0  HGB 11.9*  HCT 37.2  PLT 171   BMET  Recent Labs  01/20/14 0415  NA 144  K 5.0  CL 105  CO2 29  GLUCOSE 99  BUN 8  CREATININE 0.70  CALCIUM 8.9   PT/INR No results found for this basename: LABPROT, INR,  in the last 72 hours CMP     Component Value Date/Time   NA 144 01/20/2014 0415   NA 141 12/26/2013 1313   K 5.0 01/20/2014 0415   K 3.9 12/26/2013 1313   CL 105 01/20/2014 0415   CO2 29 01/20/2014 0415   CO2 29 12/26/2013 1313   GLUCOSE 99 01/20/2014 0415   GLUCOSE 90 12/26/2013 1313   BUN 8 01/20/2014 0415   BUN 14.4 12/26/2013 1313   CREATININE 0.70 01/20/2014 0415   CREATININE 0.8 12/26/2013 1313   CALCIUM 8.9 01/20/2014 0415   CALCIUM 9.9 12/26/2013 1313   PROT 6.9 12/26/2013 1313   PROT 6.6 12/04/2013 1644   ALBUMIN 4.1 12/26/2013 1313   ALBUMIN 3.7 12/04/2013 1644   AST 21 12/26/2013 1313   AST 20 12/04/2013 1644   ALT 14 12/26/2013 1313   ALT 14 12/04/2013 1644   ALKPHOS 49 12/26/2013 1313   ALKPHOS 50 12/04/2013 1644   BILITOT 0.50 12/26/2013 1313   BILITOT 0.2* 12/04/2013 1644   GFRNONAA 80* 01/20/2014 0415   GFRAA >90 01/20/2014 0415   Lipase     Component Value Date/Time   LIPASE 17 08/14/2008 2139       Studies/Results: Dg Chest 2 View  01/20/2014    CLINICAL DATA:  Fever, cough, chest and back pain.  EXAM: CHEST  2 VIEW  COMPARISON:  01/17/2014  FINDINGS: Cardiac silhouette is within normal limits for size. Thoracic aorta is calcified and mildly tortuous, unchanged. Elevation the right hemidiaphragm is unchanged. Areas of mildly increased interstitial markings in both lungs do not appear significantly changed. There is no evidence of confluent airspace opacity, pleural effusion, or pneumothorax. Surgical clips are noted in the right axilla. Mild S-shaped thoracolumbar scoliosis is present.  IMPRESSION: Stable appearance of the chest without evidence of acute airspace disease.   Electronically Signed   By: Logan Bores   On: 01/20/2014 12:04    Anti-infectives: Anti-infectives   None       Assessment/Plan Right chest wall mass/nodule concerning for recurrent breast cancer H/o b/l mastectomy  Cardiac history  1.  Cleared for surgery per cardiology, will proceed with excision of chest wall nodule by Dr. Rosendo Gros since Dr. [name] is not available 2.  NPO, IVF, pain control, pre-op vanc 3.  Hold heparin until after procedure    LOS: 4 days    Teague Goynes  Dort 01/21/2014, 8:37 AM Pager: [phone]

## 2014-01-21 NOTE — Anesthesia Procedure Notes (Signed)
Procedure Name: LMA Insertion Date/Time: 01/21/2014 10:54 AM Performed by: Ollen Bowl Pre-anesthesia Checklist: Patient identified, Emergency Drugs available, Suction available, Patient being monitored and Timeout performed Patient Re-evaluated:Patient Re-evaluated prior to inductionOxygen Delivery Method: Circle system utilized and Simple face mask Preoxygenation: Pre-oxygenation with 100% oxygen Intubation Type: IV induction Ventilation: Mask ventilation without difficulty LMA: LMA inserted LMA Size: 4.0 Number of attempts: 1 Airway Equipment and Method: Patient positioned with wedge pillow Tube secured with: Tape Dental Injury: Teeth and Oropharynx as per pre-operative assessment

## 2014-01-21 NOTE — Anesthesia Preprocedure Evaluation (Addendum)
Anesthesia Evaluation  Patient identified by MRN, date of birth, ID band Patient awake    Reviewed: Allergy & Precautions, H&P , NPO status , Patient's Chart, lab work & pertinent test results  History of Anesthesia Complications Negative for: history of anesthetic complications  Airway Mallampati: II TM Distance: >3 FB Neck ROM: Full    Dental  (+) Teeth Intact   Pulmonary asthma , neg sleep apnea, COPD COPD inhaler, neg recent URI,  breath sounds clear to auscultation        Cardiovascular hypertension, Pt. on medications and Pt. on home beta blockers + angina + CAD - CHF Rhythm:Regular     Neuro/Psych PSYCHIATRIC DISORDERS Anxiety Depression negative neurological ROS     GI/Hepatic GERD-  Medicated and Controlled,  Endo/Other  negative endocrine ROS  Renal/GU Renal disease     Musculoskeletal   Abdominal   Peds  Hematology negative hematology ROS (+)   Anesthesia Other Findings   Reproductive/Obstetrics                          Anesthesia Physical Anesthesia Plan  ASA: III  Anesthesia Plan: General   Post-op Pain Management:    Induction: Intravenous  Airway Management Planned: LMA  Additional Equipment: None  Intra-op Plan:   Post-operative Plan: Extubation in OR  Informed Consent: I have reviewed the patients History and Physical, chart, labs and discussed the procedure including the risks, benefits and alternatives for the proposed anesthesia with the patient or authorized representative who has indicated his/her understanding and acceptance.   Dental advisory given  Plan Discussed with: CRNA and Surgeon  Anesthesia Plan Comments:         Anesthesia Quick Evaluation

## 2014-01-21 NOTE — Progress Notes (Signed)
DC orders received.  Patient stable with no S/S of distress.  Medication and discharge information reviewed with patient and patient's daughter.  Reviewed dressing instructions with patient and patient's daughter, per surgery PA's instructions.  Patient DC home with daughter. Angel [name]

## 2014-01-21 NOTE — Progress Notes (Addendum)
Chart reviewed.  Patient seen, interviewed and examined.  Agree with note as outlined by Ignacia Bayley NP.  No further fevers and chest xray and UA normal. No further CP and myoview with no ischemia and normal LVF.  Still has PVC's but asymptomatic.  Pindolol stopped due to low BP. Continue ASA/statin for moderate CAD.   For biopsy of chest wall mass today and possible d/c home later today pending surgery recs

## 2014-01-21 NOTE — Progress Notes (Signed)
Patient Name: Angel [name] Date of Encounter: 01/21/2014   Principal Problem:   Unstable angina pectoris Active Problems:   PVC's (premature ventricular contractions)   Nodule of chest wall, right   Hyperlipemia   Osteoarthritis   COPD    SUBJECTIVE  Feeling much better.  Pindolol stopped yesterday.  No chest pain, sob, palpitations, malaise/fatigue.  She has freq pvc's and trigeminy on tele - asymptomatic.  CURRENT MEDS . aspirin EC  81 mg Oral Daily  . aspirin EC  81 mg Oral Daily  . atorvastatin  20 mg Oral q1800  . budesonide-formoterol  1 puff Inhalation Daily  . citalopram  20 mg Oral Daily  . DULoxetine  60 mg Oral Daily  . ezetimibe  10 mg Oral Daily  . heparin  5,000 Units Subcutaneous 3 times per day  . montelukast  10 mg Oral QHS  . polyethylene glycol  17 g Oral Daily  . raloxifene  60 mg Oral Daily  . sodium chloride  3 mL Intravenous Q12H    OBJECTIVE  Filed Vitals:   01/20/14 0553 01/20/14 0916 01/21/14 0136 01/21/14 0500  BP: 95/42   101/61  Pulse:    69  Temp:    98.2 F (36.8 C)  TempSrc:      Resp:    16  Height:      Weight:    147 lb 4.8 oz (66.815 kg)  SpO2:  92% 92% 95%    Intake/Output Summary (Last 24 hours) at 01/21/14 0636 Last data filed at 01/20/14 2241  Gross per 24 hour  Intake    243 ml  Output      0 ml  Net    243 ml   Filed Weights   01/17/14 1954 01/18/14 0535 01/21/14 0500  Weight: 142 lb 13.3 oz (64.788 kg) 142 lb 13.3 oz (64.788 kg) 147 lb 4.8 oz (66.815 kg)    PHYSICAL EXAM  General: Pleasant, NAD. Neuro: Alert and oriented X 3. Moves all extremities spontaneously. Psych: Normal affect. HEENT:  Normal  Neck: Supple without bruits or JVD. Lungs:  Resp regular and unlabored, CTA. Heart: Irreg, no s3, s4, or murmurs. Abdomen: Soft, non-tender, non-distended, BS + x 4.  Extremities: No clubbing, cyanosis or edema. DP/PT/Radials 2+ and equal bilaterally.  Accessory Clinical Findings  CBC  Recent  Labs  01/20/14 0415  WBC 5.0  HGB 11.9*  HCT 37.2  MCV 94.9  PLT 580   Basic Metabolic Panel  Recent Labs  01/20/14 0415  NA 144  K 5.0  CL 105  CO2 29  GLUCOSE 99  BUN 8  CREATININE 0.70  CALCIUM 8.9   Cardiac Enzymes  Recent Labs  01/18/14 0723  TROPONINI <0.30   TELE  Rsr, freq pvc's and trigeminy  Radiology/Studies  Dg Chest 2 View  01/20/2014   CLINICAL DATA:  Fever, cough, chest and back pain.  EXAM: CHEST  2 VIEW IMPRESSION: Stable appearance of the chest without evidence of acute airspace disease.   Electronically Signed   By: Logan Bores   On: 01/20/2014 12:04   Nm Myocar Multi W/spect W/wall Motion / Ef  01/18/2014   CLINICAL DATA:  80 year old female with chest pain  EXAM: MYOCARDIAL IMAGING WITH SPECT (REST AND PHARMACOLOGIC-STRESS)  GATED LEFT VENTRICULAR WALL MOTION STUDY  LEFT VENTRICULAR EJECTION FRACTION IMPRESSION: 1. No evidence of inducible ischemia. 2. Normal cardiac wall motion. 3. Calculated ejection fraction 74%. 4. Mildly decreased radiotracer uptake in the ventricular  septum on all images suggest the possibility of a region of prior infarct/scar. Alternately, the patient has a known bundle branch block this could be artifactual.   Electronically Signed   By: Jacqulynn Cadet M.D.   On: 01/18/2014 12:54    ASSESSMENT AND PLAN  1.  [place]:  S/p myoview on 4/24 revealing no evidence of ischemia and nl LV fxn.  No further ischemic w/u.  Ok to proceed with bx of chest wall nodule today.  2.  PVC's:  Asymptomatic.  She was initially placed on pindolol but was relatively hypotensive and complained of generalized malaise.  Ss have resolved following discontinuation of bb.  She has had more freq pvc's but is asymptomatic.  3.  Fever:  None since 4/25.  UA neg.  CXR w/o infiltrate.  Feeling better as above.  4.  HL:  LDL 113.  On statin and zetia.  LFT's wnl.  5. R chest wall nodule:  For bx today.  Possible d/c after.  Await surgical  disposition.  Signed, Rogelia Mire NP

## 2014-01-21 NOTE — Progress Notes (Signed)
Transported OFF TELE ( as per Lucina Mellow) by  Clayton Bibles nt/

## 2014-01-22 NOTE — Progress Notes (Signed)
D/C instructions and scripts given to Pt. Pt verbalized understanding of how to admin Lovenox. Pt had no questions concerning discharge and ability to admin Lovenox at home. Pts husband at the bedside to take Pt home.

## 2014-01-24 ENCOUNTER — Other Ambulatory Visit: Payer: Self-pay | Admitting: Oncology

## 2014-01-24 ENCOUNTER — Encounter (HOSPITAL_COMMUNITY): Payer: Self-pay | Admitting: General Surgery

## 2014-01-26 ENCOUNTER — Telehealth: Payer: Self-pay | Admitting: Oncology

## 2014-01-26 NOTE — Telephone Encounter (Signed)
, °

## 2014-01-27 ENCOUNTER — Other Ambulatory Visit: Payer: Self-pay | Admitting: Oncology

## 2014-01-27 DIAGNOSIS — C50919 Malignant neoplasm of unspecified site of unspecified female breast: Secondary | ICD-10-CM

## 2014-01-28 ENCOUNTER — Encounter (INDEPENDENT_AMBULATORY_CARE_PROVIDER_SITE_OTHER): Payer: Self-pay | Admitting: Surgery

## 2014-01-28 ENCOUNTER — Ambulatory Visit (INDEPENDENT_AMBULATORY_CARE_PROVIDER_SITE_OTHER): Payer: Medicare Other | Admitting: Surgery

## 2014-01-28 ENCOUNTER — Encounter (INDEPENDENT_AMBULATORY_CARE_PROVIDER_SITE_OTHER): Payer: Medicare Other | Admitting: Surgery

## 2014-01-28 VITALS — BP 132/78 | HR 72 | Temp 97.8°F | Resp 16 | Ht 62.0 in | Wt 148.6 lb

## 2014-01-28 DIAGNOSIS — R222 Localized swelling, mass and lump, trunk: Secondary | ICD-10-CM

## 2014-01-28 DIAGNOSIS — C50919 Malignant neoplasm of unspecified site of unspecified female breast: Secondary | ICD-10-CM

## 2014-01-28 NOTE — Progress Notes (Signed)
General Surgery [facility]  Chief Complaint  Patient presents with  . Routine Post Op    skin recurrence of breast cancer - excised by Dr. Rosendo Gros on 01/21/2014    HISTORY: Patient is a 80 year old female with a personal history of right breast carcinoma. She presented with a cutaneous recurrence. She was scheduled for local excision as an outpatient but her procedure was canceled due to cardiovascular complications. After stabilization the patient was taken to the operating room by Dr. Rosendo Gros and underwent local excision of the cutaneous recurrence on the right chest wall. Patient returns today for wound check.  EXAM: Incision is healing. Nylon stitches remain in place. The wound is quite tight. There is no sign of infection.  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 Further planning for medical and surgical management will follow that evaluation.  If the patient requires further surgical excision, we will need to involve plastic surgery for skin flap or skin grafting.  Patient will return in one week for wound check and suture removal. I am concerned about wound healing due to her previous history of radiation.  Pathology report is reviewed with the patient and her two daughters. A copy of that report is provided to them to take home.  Earnstine Regal, MD, [facility]   Visit Diagnoses: 1. Nodule of chest wall, right   2. Breast cancer

## 2014-01-28 NOTE — Patient Instructions (Signed)
May shower.  Return in one week for suture removal.  Earnstine Regal, MD, [facility] Office: [phone]

## 2014-01-30 ENCOUNTER — Encounter (INDEPENDENT_AMBULATORY_CARE_PROVIDER_SITE_OTHER): Payer: Medicare Other | Admitting: Surgery

## 2014-01-31 ENCOUNTER — Encounter: Payer: Self-pay | Admitting: Oncology

## 2014-01-31 ENCOUNTER — Ambulatory Visit (HOSPITAL_BASED_OUTPATIENT_CLINIC_OR_DEPARTMENT_OTHER): Payer: Medicare Other | Admitting: Oncology

## 2014-01-31 ENCOUNTER — Telehealth: Payer: Self-pay | Admitting: Oncology

## 2014-01-31 ENCOUNTER — Encounter (INDEPENDENT_AMBULATORY_CARE_PROVIDER_SITE_OTHER): Payer: Medicare Other | Admitting: General Surgery

## 2014-01-31 ENCOUNTER — Encounter: Payer: Self-pay | Admitting: Radiation Oncology

## 2014-01-31 ENCOUNTER — Other Ambulatory Visit (HOSPITAL_BASED_OUTPATIENT_CLINIC_OR_DEPARTMENT_OTHER): Payer: Medicare Other

## 2014-01-31 VITALS — BP 106/48 | HR 60 | Temp 97.7°F | Resp 17 | Ht 62.0 in | Wt 148.4 lb

## 2014-01-31 DIAGNOSIS — C44509 Unspecified malignant neoplasm of skin of other part of trunk: Secondary | ICD-10-CM

## 2014-01-31 DIAGNOSIS — C44599 Other specified malignant neoplasm of skin of other part of trunk: Secondary | ICD-10-CM

## 2014-01-31 DIAGNOSIS — C50919 Malignant neoplasm of unspecified site of unspecified female breast: Secondary | ICD-10-CM

## 2014-01-31 DIAGNOSIS — Z853 Personal history of malignant neoplasm of breast: Secondary | ICD-10-CM

## 2014-01-31 DIAGNOSIS — M810 Age-related osteoporosis without current pathological fracture: Secondary | ICD-10-CM

## 2014-01-31 LAB — CBC WITH DIFFERENTIAL/PLATELET
BASO%: 0.9 % (ref 0.0–2.0)
BASOS ABS: 0.1 10*3/uL (ref 0.0–0.1)
EOS ABS: 0.2 10*3/uL (ref 0.0–0.5)
EOS%: 2.2 % (ref 0.0–7.0)
HCT: 39 % (ref 34.8–46.6)
HEMOGLOBIN: 13.1 g/dL (ref 11.6–15.9)
LYMPH%: 31.1 % (ref 14.0–49.7)
MCH: 30.9 pg (ref 25.1–34.0)
MCHC: 33.7 g/dL (ref 31.5–36.0)
MCV: 91.8 fL (ref 79.5–101.0)
MONO#: 0.6 10*3/uL (ref 0.1–0.9)
MONO%: 8.3 % (ref 0.0–14.0)
NEUT%: 57.5 % (ref 38.4–76.8)
NEUTROS ABS: 3.9 10*3/uL (ref 1.5–6.5)
PLATELETS: 251 10*3/uL (ref 145–400)
RBC: 4.25 10*6/uL (ref 3.70–5.45)
RDW: 13.5 % (ref 11.2–14.5)
WBC: 6.8 10*3/uL (ref 3.9–10.3)
lymph#: 2.1 10*3/uL (ref 0.9–3.3)

## 2014-01-31 NOTE — Progress Notes (Signed)
OFFICE PROGRESS NOTE   01/31/2014   Physicians:W.McKeown, T.Gerkin, R.Ramos, P.Wright, T.Turner   INTERVAL HISTORY:  Patient is seen, together with 2 daughters, now confirmed to have recurrent breast cancer on right chest wall. Excisional biopsy was done 01-21-14 by Dr [name], while she was hospitalized for unstable angina. Pathology([id number]) found grade 2 invasive ductal carcinoma with involvement at margin(s) and lymphovascular space invasion, ER positive 100%, PR positive 75%, proliferation marker 80% and HER-2 negative by C[facility]. She still has sutures in place, however area does seem to be healing, which was of concern due to previous right breast radiation; she is to see Dr [name] again next week. Dr [name] has told her than further surgery on chest wall would require skin graft. Only imaging in hospital was CXRs, which were stable.  After I had seen her in early 12-2013 and prior to outpatient biopsy, she was admited from 4-23 thru 01-21-14 with unstable angina. She ruled out for MI, had lexiscan myoview which had normal LV function without inducible ischemia, and had adjustments in medications for PVCs.She has not had severe angina since discharge from hospital. Metoprolol dose was decreased to present 12.5 mg bid with ASA.    I have confirmed with radiation oncology that treating physician in 2000 was Dr [name].    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 She had a second right breast cancer in 2012, with right mastectomy by Dr [name] 07-29-2011 for T1N0M0 ER/PR positive, HER-2 negative invasive ductal carcinoma; she also had what was planned as prophylactic left mastectomy, with unexpected finding of left DCIS in that pathology. Plan in 09-2011 was to begin tamoxifen, chosen particularly due to low bone density. Patient did not keep follow up visits at this office,  stopped tamoxifen due intolerance (tho she does not remember what problem she had with this) and was treated with raloxifene by Dr [name].  She was seen next at this office on 12-26-2013 with an enlarging nodular area above right mastectomy scar,reportedly present for several months. Excisional biopsy done 01-21-14 found grade 2 invasive ductal carcinoma with involvement at [facility]) and lymphovascular space invasion, ER positive 100%, PR positive 75%, proliferation marker 80% and HER-2 negative by C[facility] (pathology [phone]).        Review of systems as above, also: She does not tolerate much exertion. She denies orthopnea or LE swelling. Appetite is at baseline. She has recent onset of pain in right shoulder with lifting and some positions. No fever. No cough and no SOB at rest. No other bone pain. Remainder of 10 point Review of Systems negative.  Objective:  Vital signs in last 24 hours:  BP 106/48   Pulse 60   Temp(Src) 97.7 F (36.5 C) (Oral)   Resp 17   Ht _0  (1.575 m)   Wt 148 lb 6.4 oz (67.314 kg)   BMI 27.14 kg/m2   SpO2 95% Weight is down 2.5 lbs. Alert, oriented and appropriate. Ambulatory without assistance.    HEENT:PERRL, sclerae not icteric. Oral mucosa moist without lesions, posterior pharynx clear.  Neck supple. No JVD.  Lymphatics:no cervical,suraclavicular, axillary or inguinal adenopathy Resp: clear to auscultation bilaterally and normal percussion bilaterally Cardio: regular rate and rhythm. No gallop. GI: soft, nontender, not distended, no mass or organomegaly. Normally active bowel sounds.  Musculoskeletal/ Extremities: without pitting edema, cords, tenderness. RUE and shoulder without swelling, erythema or rash. Neuro: nonfocal PSYCH:  mood and affect normal Skin without rash, ecchymosis, petechiae Breasts: left mastectomy scar not remarkable. Right chest wall with excisional biopsy site clean, no erythema or swelling, sutures in. Per daughter, area much  better than a week ago. No obvious residual skin lesions.  Lab Results:  Results for orders placed in visit on 01/31/14  CBC WITH DIFFERENTIAL      Result Value Ref Range   WBC 6.8  3.9 - 10.3 10e3/uL   NEUT# 3.9  1.5 - 6.5 10e3/uL   HGB 13.1  11.6 - 15.9 g/dL   HCT 39.0  34.8 - 46.6 %   Platelets 251  145 - 400 10e3/uL   MCV 91.8  79.5 - 101.0 fL   MCH 30.9  25.1 - 34.0 pg   MCHC 33.7  31.5 - 36.0 g/dL   RBC 4.25  3.70 - 5.45 10e6/uL   RDW 13.5  11.2 - 14.5 %   lymph# 2.1  0.9 - 3.3 10e3/uL   MONO# 0.6  0.1 - 0.9 10e3/uL   Eosinophils Absolute 0.2  0.0 - 0.5 10e3/uL   Basophils Absolute 0.1  0.0 - 0.1 10e3/uL   NEUT% 57.5  38.4 - 76.8 %   LYMPH% 31.1  14.0 - 49.7 %   MONO% 8.3  0.0 - 14.0 %   EOS% 2.2  0.0 - 7.0 %   BASO% 0.9  0.0 - 2.0 %   Available after visit: CA 2729  20 and CEA   1.4   Studies/Results:  Angel [name], Angel [name] BCollected: 01/21/2014 Client: [place] Accession: [id number] Received: 01/21/2014 [name], MD DOB: 12-14-1931 Age: 80 Gender: F Reported: 01/22/2014 [address] Patient Ph: [phone] MRN #: [id number] [place], [place] [address] Visit #: [id number] Chart #: Phone:  Fax: CC: Sanda Klein, MD REPORT OF SURGICAL PATHOLOGY ADDITIONAL INFORMATION: PROGNOSTIC INDICATORS - ACIS Results: IMMUNOHISTOCHEMICAL AND MORPHOMETRIC ANALYSIS BY THE AUTOMATED CELLULAR IMAGING SYSTEM (ACIS) Estrogen Receptor: 100%, POSITIVE, STRONG STAINING INTENSITY Progesterone Receptor: 75%, POSITIVE, MODERATE STAINING INTENSITY Proliferation Marker Ki67: 80% REFERENCE RANGE ESTROGEN RECEPTOR NEGATIVE <1% POSITIVE =>1% PROGESTERONE RECEPTOR NEGATIVE <1% POSITIVE =>1% All controls stained appropriately Claudette Laws MD Pathologist, Electronic Signature ( Signed 01/25/2014) CHROMOGENIC IN-SITU HYBRIDIZATION Results: HER-2/NEU BY CISH - NO AMPLIFICATION OF HER-2 DETECTED. RESULT RATIO OF HER2: CEP 17 SIGNALS 1.26 AVERAGE HER2 COPY  NUMBER PER CELL 2.45 REFERENCE RANGE 1 of 3 FINAL for Angel [name], Angel [name] [phone]) ADDITIONAL INFORMATION:(continued) NEGATIVE HER2/Chr17 Ratio <2.0 and Average HER2 copy number <4.0 EQUIVOCAL HER2/Chr17 Ratio <2.0 and Average HER2 copy number 4.0 and <6.0 POSITIVE HER2/Chr17 Ratio >=2.0 and/or Average HER2 copy number >=6.0 Claudette Laws MD Pathologist, Electronic Signature ( Signed 01/25/2014) FINAL DIAGNOSIS Diagnosis Skin , Right chest wall - INVASIVE DUCTAL CARCINOMA, GRADE II/III, SPANNING 1.7 CM. - INVASIVE CARCINOMA IS PRESENT AT NON ORIENTED INKED TISSUE EDGE(S). - LYMPHOVASCULAR INVASION IS IDENTIFIED. - SEE COMMENT. Microscopic Comment A breast prognostic profile will be performed and the results reported separately. (JBK:gt, 01/22/14) Enid Cutter MD Pathologist, Electronic Signature (Case signed 01/22/2014) Specimen Gross and Clinical Information Specimen(s) Obtained: Skin , Right chest wall Specimen Clinical Information Recurrent right breast mass (tl) Gross Received in formalin is a 3.5 x 2.2 cm ellipse of skin and subcutaneous tissue excised to a depth of up to 1.8 cm. The skin surface shows a 1.7 x 1.5 cm indurated slightly raised plaque. Subjacent to the plaque there is a 1.7 x 1.5 x 1 cm indurated, mottled tan white to pale yellow mass. The  mass extends to within 0.2 cm of the soft tissue margin which does not grossly appear to be have been transected.   Medications: I have reviewed the patient's current medications.  DISCUSSION: we have discussed this new diagnosis of at least locally recurrent right breast cancer, with features very similar to previous invasive right cancer. Patient and daughters understand that pathologic margins were not clear. I have recommended consultation with Dr Valere Dross to see if further radiation is an option for chest wall. She also needs full staging scans, which would be best as PET particularly given some bone complaints  (otherwise will need body CTs + bone scan). Patient seems very willing now to be compliant with visits and instructions. She and daughters would also like to meet with genetics counselor.  Assessment/Plan: 1.invasive ductal carcinoma of right breast ER PR +/ HER-2 negative recurrent on right chest wall: post excisional biopsy with positive margins. Will stage with PET/CT and ask Dr Valere Dross to see, as we need to know if further local radiation is an option for the positive biopsy margins. I will see her back after scans and the RT consultation. Genetics referral at request of patient and daughters. 2.cardiac disease, with recent unstable angina and frequent PVCs, CAD stable by cath 11-2013.  3.History of bilateral DCIS and invasive ductal carcinoma of right breast: history as above 4.osteoporosis by bone density 2012 5.chronic bronchitis and asthma 6.remote hysterectomy without oophorectomy 5.degenerative arthritis and spinal stenosis.    Patient and daughters have had questions answered to their satisfaction and are in agreement with plan. Time spent 40 min including >50% counseling and coordination of care.   Gordy Levan, MD   01/31/2014, 11:48 AM

## 2014-01-31 NOTE — Telephone Encounter (Signed)
, °

## 2014-02-01 ENCOUNTER — Encounter: Payer: Self-pay | Admitting: Radiation Oncology

## 2014-02-01 LAB — CEA: CEA: 1.4 ng/mL (ref 0.0–5.0)

## 2014-02-01 LAB — CANCER ANTIGEN 27.29: CA 27.29: 20 U/mL (ref 0–39)

## 2014-02-01 NOTE — Progress Notes (Signed)
Location of Breast Cancer: right chest wall  Histology per Pathology Report:  01/21/14 Diagnosis Skin , Right chest wall - INVASIVE DUCTAL CARCINOMA, GRADE II/III, SPANNING 1.7 CM. - INVASIVE CARCINOMA IS PRESENT AT NON ORIENTED INKED TISSUE EDGE(S). - LYMPHOVASCULAR INVASION IS IDENTIFIED.  Receptor Status: ER(100%), PR (75%), Her2-neu (-)  Did patient present with symptoms (if so, please note symptoms) or was this found on screening mammography?: patient palpated small nodule on right anterior chest wall approximately 4 months ago  Past/Anticipated interventions by surgeon, if any: Dr [name]: surgical excision which was cancelled due to cardiovascular complications, underwent biopsy  Past/Anticipated interventions by medical oncology, if any: Chemotherapy , Dr Marko Plume note on 01/31/14 incomplete  Lymphedema issues, if any:    Pain issues, if any:    SAFETY ISSUES:  Prior radiation? YES, 07/02/1999- 08/11/1999  Right supraclavicular/axillary region 5040 cGy, 28 fractions, 40 days, Dr Valere Dross  Pacemaker/ICD? no  Possible current pregnancy? no  Is the patient on methotrexate? 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

## 2014-02-05 ENCOUNTER — Ambulatory Visit
Admission: RE | Admit: 2014-02-05 | Discharge: 2014-02-05 | Disposition: A | Discharge status: Home / Self Care | Payer: Medicare Other | Source: Ambulatory Visit | Attending: Radiation Oncology | Admitting: Radiation Oncology

## 2014-02-05 ENCOUNTER — Ambulatory Visit: Payer: Medicare Other

## 2014-02-05 DIAGNOSIS — Z510 Encounter for antineoplastic radiation therapy: Secondary | ICD-10-CM | POA: Insufficient documentation

## 2014-02-05 DIAGNOSIS — C50919 Malignant neoplasm of unspecified site of unspecified female breast: Secondary | ICD-10-CM | POA: Insufficient documentation

## 2014-02-06 ENCOUNTER — Encounter (INDEPENDENT_AMBULATORY_CARE_PROVIDER_SITE_OTHER): Payer: Medicare Other | Admitting: General Surgery

## 2014-02-07 ENCOUNTER — Encounter: Payer: Self-pay | Admitting: *Deleted

## 2014-02-07 ENCOUNTER — Ambulatory Visit
Admission: RE | Admit: 2014-02-07 | Discharge: 2014-02-07 | Disposition: A | Discharge status: Home / Self Care | Payer: Medicare Other | Source: Ambulatory Visit | Attending: Radiation Oncology | Admitting: Radiation Oncology

## 2014-02-07 ENCOUNTER — Encounter: Payer: Self-pay | Admitting: Radiation Oncology

## 2014-02-07 VITALS — BP 107/62 | HR 66 | Temp 98.0°F | Resp 20 | Ht 62.0 in | Wt 151.2 lb

## 2014-02-07 DIAGNOSIS — C50919 Malignant neoplasm of unspecified site of unspecified female breast: Secondary | ICD-10-CM

## 2014-02-07 DIAGNOSIS — R222 Localized swelling, mass and lump, trunk: Secondary | ICD-10-CM

## 2014-02-07 DIAGNOSIS — Z510 Encounter for antineoplastic radiation therapy: Secondary | ICD-10-CM | POA: Diagnosis present

## 2014-02-07 NOTE — Progress Notes (Signed)
Please see the Nurse Progress Note in the MD Initial Consult Encounter for this patient. 

## 2014-02-07 NOTE — Progress Notes (Addendum)
Location of Breast Cancer: right chest wall  Histology per Pathology Report:  01/21/14  Diagnosis  Skin , Right chest wall  - INVASIVE DUCTAL CARCINOMA, GRADE II/III, SPANNING 1.7 CM.  - INVASIVE CARCINOMA IS PRESENT AT NON ORIENTED INKED TISSUE EDGE(S).  - LYMPHOVASCULAR INVASION IS IDENTIFIED.  Receptor Status: ER(100%), PR (75%), Her2-neu (-)  Did patient present with symptoms (if so, please note symptoms) or was this found on screening mammography?: patient palpated small nodule on right anterior chest wall approximately 4 months ago   Past/Anticipated interventions by surgeon, if any: Dr [name]: surgical excision which was cancelled due to cardiovascular complications, underwent biopsy , f/u post op Dr.Rosenbower 02/08/14 remove sutures right chest wall  Past/Anticipated interventions by medical oncology, if any: Chemotherapy , Dr Marko Plume note on 01/31/14 incomplete   Lymphedema issues, if any: no  Pain issues, if any:  Yes mild tightness chest, took vicodin this am,   SAFETY ISSUES: yes, Blind left eye, retinal tear right eye, using cane,unsteady  Prior radiation? YES, 07/02/1999- 08/11/1999 Right supraclavicular/axillary region 5040 cGy, 28 fractions, 40 days, Dr Valere Dross  Pacemaker/ICD? no  Possible current pregnancy? no  Is the patient on methotrexate? 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

## 2014-02-07 NOTE — Progress Notes (Signed)
[facility] NEW PATIENT EVALUATION  Name: Angel [name] MRN: [id number]  Date:   02/07/2014           DOB: January 03, 1932  Status: outpatient   CC: MCKEOWN,WILLIAM DAVID, MD  Gordy Levan, MD    REFERRING PHYSICIAN: Gordy Levan, MD   DIAGNOSIS: Recurrent invasive ductal carcinoma of the right breast/chest wall recurrence   HISTORY OF PRESENT ILLNESS:  Angel [name] is a 80 y.o. female who is seen today through the courtesy of Dr. Evlyn Clines for consideration of right chest wall radiation therapy in the management of her recurrent carcinoma of the right breast. I first saw the patient back in September of 2000 and which time she presented with "DCIS "of the right breast with a sentinel lymph node positive for cytokeratin. Her primary tumor was strongly ER/PR positive with a low proliferation index. She received 5040 cGy in 28 sessions to the right breast and regional lymph nodes. She believes that she may have taken adjuvant tamoxifen for may be 1 or 2 months. It was not clear whether not she had any toxicity. She did well until 2012 when she developed a new mass within the upper portion of the right breast with a biopsy showing invasive ductal carcinoma along with DCIS which was also ER positive and PR positive, but with an elevated proliferation fraction of 35%. She underwent a right mastectomy and prophylactic left mastectomy by Dr. [name] in November 2012. She was found to have incidental DCIS within the left breast. She did not take adjuvant antiestrogen therapy. Most recently she developed a palpable nodule along the upper right anterior chest wall approximately 3 months ago. This grew in size and she was seen by Dr. [name]  one month ago and he described a nodular lesion measuring approximately 3.0 cm. This was excised by Dr. Rosendo Gros on 01/21/2014. He noted firmness of the chest wall muscle and a superficial portion of the pectoralis  muscle which was also harvested with the specimen. On review of her pathology she was found to have invasive ductal carcinoma, grade 2, spanning 1.7 cm. There was invasive carcinoma present at the non-oriented inked margins and LV I  was seen. Her tumor was ER positive at 100%, PR positive at 75%, and now with a proliferation Ki-67 marker of 80%. Chest x-ray from 01/20/2014 is without evidence for metastatic disease. The patient was seen in consultation by Dr. Marko Plume who inquired about possible further radiation therapy to her right chest wall. She apparently has significant cardiac comorbidity.  PREVIOUS RADIATION THERAPY: Yes, as described above.   PAST MEDICAL HISTORY:  has a past medical history of Asthma; Hyperlipemia; Spinal stenosis; Osteoporosis; Chronic bronchitis; Kidney stones; DDD (degenerative disc disease); Osteoarthritis; Degenerative joint disease; Skin cancer of face; Hypertension; Coronary artery disease (Non-obstructive); Depression; Anxiety; COPD (chronic obstructive pulmonary disease); Dyslipidemia; Cholelithiasis; GERD (gastroesophageal reflux disease); radiation therapy (07/02/1999- 08/11/1999); and Breast cancer (05/07/1999, 06/2011).     PAST SURGICAL HISTORY:  Past Surgical History  Procedure Laterality Date  . Vesicovaginal fistula closure w/ tah  age 28  . Tubal ligation  1961  . Appendectomy    . Abdominal hysterectomy      in her 30's  . Small intestine surgery  1998    [facility]  . Tonsillectomy    . Breast lumpectomy  2002    right breast  . Mastectomy  07/29/11    bilateral-rt nodes-none on left  . Cardiac catheterization  03/2011, 3/15  . Breast biopsy Right 01/21/2014    Procedure: BREAST/CHEST WALL BIOPSY;  Surgeon: [name], MD;  Location: [facility];  Service: General;  Laterality: Right;  . Vein ligation and stripping Right      FAMILY HISTORY: family history includes Asthma in her brother, brother, daughter, and grandchild; Cancer in her mother; Emphysema  in her mother; Heart disease in her brother, father, and mother; Lymphoma in her brother; Pancreatic cancer in her mother.   SOCIAL HISTORY:  reports that she has never smoked. She has never used smokeless tobacco. She reports that she drinks alcohol. She reports that she does not use illicit drugs. Widowed for 40 years, 3 or 4 children living.   ALLERGIES: Adhesive and Penicillins   MEDICATIONS:  Current Outpatient Prescriptions  Medication Sig Dispense Refill  . albuterol (PROVENTIL HFA;VENTOLIN HFA) 108 (90 BASE) MCG/ACT inhaler Inhale into the lungs every 6 (six) hours as needed for wheezing or shortness of breath.      [name] aspirin 325 MG EC tablet Take 325 mg by mouth.      [name] atorvastatin (LIPITOR) 80 MG tablet Take 80 mg by mouth daily at 6 PM.       . Calcium-Magnesium-Zinc 1000-400-15 MG TABS Take 1 tablet by mouth daily.        . celecoxib (CELEBREX) 200 MG capsule Take 200 mg by mouth daily.       . Cholecalciferol (VITAMIN D-3) 5000 UNITS TABS Take 1 tablet by mouth daily.       . citalopram (CELEXA) 20 MG tablet Take 1 tablet by mouth  every day for mood  90 tablet  0  . diazepam (VALIUM) 5 MG tablet Take 5 mg by mouth every 6 (six) hours as needed for anxiety.      . DULoxetine (CYMBALTA) 60 MG capsule Take 1 capsule by mouth  every day for mood  90 capsule  0  . ezetimibe (ZETIA) 10 MG tablet Take 10 mg by mouth daily.      . Fish Oil OIL Take 100 mg by mouth 3 (three) times daily.      . Flaxseed, Linseed, (FLAX SEED OIL PO) Take 5 mLs by mouth daily. 1 tsp daily      . HYDROcodone-acetaminophen (NORCO) 10-325 MG per tablet Take 1 tablet by mouth every 4 (four) hours as needed. For pain      . metoprolol tartrate (LOPRESSOR) 25 MG tablet Take 12.5 mg by mouth 2 (two) times daily.      . mometasone (NASONEX) 50 MCG/ACT nasal spray Place 2 sprays into the nose daily.  17 g  12  . montelukast (SINGULAIR) 10 MG tablet Take 10 mg by mouth at bedtime.      . Multiple  Vitamins-Minerals (MULTIVITAMINS THER. W/MINERALS) TABS Take 1 tablet by mouth daily.        . raloxifene (EVISTA) 60 MG tablet Take 60 mg by mouth daily.      . SYMBICORT 160-4.5 MCG/ACT inhaler Inhale 1 puff into the lungs daily.      . nitroGLYCERIN (NITROSTAT) 0.4 MG SL tablet Place 1 tablet (0.4 mg total) under the tongue every 5 (five) minutes x 3 doses as needed for chest pain.  25 tablet  3   No current facility-administered medications for this encounter.     REVIEW OF SYSTEMS:  Pertinent items are noted in HPI.    PHYSICAL EXAM:  height is '5\' 2"'  (1.575 m) and weight is 151 lb  3.2 oz (68.584 kg). Her oral temperature is 98 F (36.7 C). Her blood pressure is 107/62 and her pulse is 66. Her respiration is 20 and oxygen saturation is 98%.   Alert and oriented 80 year old white female appearing her stated age. Head and neck examination: Grossly unremarkable. Nodes: Without palpable cervical, supraclavicular, or axillary lymphadenopathy. Chest: On inspection of the right chest wall there is a healing wound with sutures superior to her mastectomy scar. There is surrounding erythema but no obvious persistent/recurrent disease. Lungs are clear. Left chest wall without evidence for recurrent disease. Abdomen: Without hepatomegaly. Extremities: Without edema.   LABORATORY DATA:  Lab Results  Component Value Date   WBC 6.8 01/31/2014   HGB 13.1 01/31/2014   HCT 39.0 01/31/2014   MCV 91.8 01/31/2014   PLT 251 01/31/2014   Lab Results  Component Value Date   NA 144 01/20/2014   K 5.0 01/20/2014   CL 105 01/20/2014   CO2 29 01/20/2014   Lab Results  Component Value Date   ALT 14 12/26/2013   AST 21 12/26/2013   ALKPHOS 49 12/26/2013   BILITOT 0.50 12/26/2013      IMPRESSION: Recurrent carcinoma the right breast involving the chest wall. Fortunately, her tumor remains hormone receptor positive, however her proliferation index is now 80% indicative of a more aggressive carcinoma. Ideally, we would like  to have negative margins to maximize local control, however, she is not a great candidate for an extensive resection which may require skin grafting as well.  She is a candidate for further radiation therapy to her right chest wall. I feel that she can tolerate a further 4500 cGy in 25 sessions with electron beam. We discussed the potential acute and late toxicities of radiation therapy and she wishes to proceed as outlined. I spoke with Dr. Marko Plume who will begin antiestrogen therapy following completion of radiation therapy. Consent is signed today.   PLAN: As discussed above.  I spent 60 minutes minutes face to face with the patient and more than 50% of that time was spent in counseling and/or coordination of care.

## 2014-02-08 ENCOUNTER — Encounter: Payer: Self-pay | Admitting: *Deleted

## 2014-02-08 ENCOUNTER — Ambulatory Visit (INDEPENDENT_AMBULATORY_CARE_PROVIDER_SITE_OTHER): Payer: Medicare Other | Admitting: General Surgery

## 2014-02-08 DIAGNOSIS — Z4889 Encounter for other specified surgical aftercare: Secondary | ICD-10-CM

## 2014-02-08 NOTE — Progress Notes (Signed)
[facility] Psychosocial Distress Screening Clinical Social Work  Clinical Social Work was referred by distress screening protocol.  The patient scored a 8 on the Psychosocial Distress Thermometer which indicates severe distress. Clinical Social Worker phoned to assess for distress and other psychosocial needs. Pt not available, CSW left message.   ONCBCN DISTRESS SCREENING 02/07/2014  Screening Type Initial Screening  [place]ta [place] the number that describes how much distress you have been experiencing in the past week 8  Practical problem type Housing;Insurance;Transportation  Emotional problem type Depression;Nervousness/Anxiety;Adjusting to illness;Adjusting to appearance changes  Information Concerns Type Lack of info about diagnosis;Lack of info about treatment;Lack of info about complementary therapy choices  Physical Problem type Breathing;Swollen arms/legs;Nausea/vomiting;Getting around;Bathing/dressing  Physician notified of physical symptoms Yes  Referral to clinical social work Yes    Clinical Social Worker follow up needed: yes  If yes, follow up plan: CSW will continue to try to contact pt to reassess needs.   [name], LCSW Clinical Social Worker [facility] [place] Wednesday, Thursday and Friday Phone: [phone] Fax: [phone]

## 2014-02-08 NOTE — Patient Instructions (Signed)
Let steri strips fall off by themselves.

## 2014-02-08 NOTE — Progress Notes (Signed)
She is here for a postoperative visit after excision of the chest wall nodule from the right side. This is recurrent breast cancer and she has had bilateral mastectomies in the past. She is seeing Dr. Valere Dross did discuss radiation. She also has an appointment to see medical oncology.  An exam right chest wall incision is clean and intact. Sutures removed and benzoin and Steri-Strips applied.  Impression right chest wall recurrence status post removal; microscopic margins positive.  Plan: Further treatment per medical and radiation oncology. Can follow up with Dr. Rosendo Gros if anything surgically ss needed.

## 2014-02-11 ENCOUNTER — Telehealth: Payer: Self-pay | Admitting: *Deleted

## 2014-02-11 NOTE — Telephone Encounter (Signed)
Tried to return call

## 2014-02-13 ENCOUNTER — Ambulatory Visit
Admission: RE | Admit: 2014-02-13 | Discharge: 2014-02-13 | Disposition: A | Discharge status: Home / Self Care | Payer: Medicare Other | Source: Ambulatory Visit | Attending: Radiation Oncology | Admitting: Radiation Oncology

## 2014-02-13 DIAGNOSIS — C50919 Malignant neoplasm of unspecified site of unspecified female breast: Secondary | ICD-10-CM

## 2014-02-13 DIAGNOSIS — Z510 Encounter for antineoplastic radiation therapy: Secondary | ICD-10-CM | POA: Diagnosis not present

## 2014-02-13 NOTE — Progress Notes (Signed)
Complex simulation/treatment planning note: The patient was taken to the CT simulator. A Vac lock immobilization device was constructed on a custom breast board. I outlined her right chest wall treatment field. This was marked with a radiopaque wire. She was then scanned. An isocenter was chosen along the central aspect of the field. The CT data set was sent to the planning system for contouring. I am prescribing 4500 cGy in 25 sessions utilizing 6 MEV electrons. I requesting construction of 0.8 1.0 cm custom bolus with Monte Carlo dose calculation. A special port plan is requested.

## 2014-02-14 ENCOUNTER — Encounter: Payer: Self-pay | Admitting: *Deleted

## 2014-02-14 NOTE — Progress Notes (Signed)
[facility] Work  Clinical Social Work was referred by [employer] for assessment of psychosocial needs due to possible transportation concerns.  Clinical Social Worker left vm on home phone in attempt to offer support and assess for needs.  Johnnye Lana, CSW also left message for daughter the other day regarding this issue.     [name], LCSW Clinical Social Worker [facility] [place] for [place] Wednesday, Thursday and Friday Phone: [phone] Fax: [phone]

## 2014-02-15 ENCOUNTER — Ambulatory Visit (HOSPITAL_COMMUNITY)
Admission: RE | Admit: 2014-02-15 | Discharge: 2014-02-15 | Disposition: A | Discharge status: Home / Self Care | Payer: Medicare Other | Source: Ambulatory Visit | Attending: Oncology | Admitting: Oncology

## 2014-02-15 DIAGNOSIS — Z9010 Acquired absence of unspecified breast and nipple: Secondary | ICD-10-CM | POA: Insufficient documentation

## 2014-02-15 DIAGNOSIS — C50919 Malignant neoplasm of unspecified site of unspecified female breast: Secondary | ICD-10-CM | POA: Insufficient documentation

## 2014-02-15 LAB — GLUCOSE, CAPILLARY: Glucose-Capillary: 87 mg/dL (ref 70–99)

## 2014-02-15 MED ORDER — FLUDEOXYGLUCOSE F - 18 (FDG) INJECTION
8.3000 | Freq: Once | INTRAVENOUS | Status: AC | PRN
  Administered 2014-02-15: 8.3 via INTRAVENOUS

## 2014-02-19 ENCOUNTER — Encounter: Payer: Self-pay | Admitting: Radiation Oncology

## 2014-02-19 DIAGNOSIS — Z510 Encounter for antineoplastic radiation therapy: Secondary | ICD-10-CM | POA: Diagnosis not present

## 2014-02-19 NOTE — Progress Notes (Signed)
Electron beam simulation/treatment planning note: The patient completed her treatment planning in the management of her recurrent carcinoma the breast with electron beam therapy to her right chest wall. She was set up en face. A custom block was constructed to conform the field. She underwent Monte Carlo dose calculations. A special port plan/isodose curve was questionably. I prescribing 4500 cGy 25 sessions utilizing 6 MEV electrons. The doses prescribed at the 90% isodose curve. On the first day of her treatment she'll have construction of 0.8 cm custom bolus.

## 2014-02-20 ENCOUNTER — Ambulatory Visit
Admission: RE | Admit: 2014-02-20 | Discharge: 2014-02-20 | Disposition: A | Discharge status: Home / Self Care | Payer: Medicare Other | Source: Ambulatory Visit | Attending: Radiation Oncology | Admitting: Radiation Oncology

## 2014-02-20 ENCOUNTER — Encounter: Payer: Self-pay | Admitting: Internal Medicine

## 2014-02-20 DIAGNOSIS — Z510 Encounter for antineoplastic radiation therapy: Secondary | ICD-10-CM | POA: Diagnosis not present

## 2014-02-20 DIAGNOSIS — R222 Localized swelling, mass and lump, trunk: Secondary | ICD-10-CM

## 2014-02-20 MED ORDER — RADIAPLEXRX EX GEL
Freq: Once | CUTANEOUS | Status: AC
  Administered 2014-02-20: 11:00:00 via TOPICAL

## 2014-02-20 MED ORDER — ALRA NON-METALLIC DEODORANT (RAD-ONC)
1.0000 "application " | Freq: Once | TOPICAL | Status: AC
  Administered 2014-02-20: 1 via TOPICAL

## 2014-02-20 NOTE — Progress Notes (Signed)
Post sim ed completed w/pt and daughter.  Gave pt "Radiation and You" booklet w/all pertinent information marked and discussed, re: fatigue, skin irritation/care, nutrition, pain. Gave pt Radiaplex, [place] w/instructions for proper use. Pt and daughter verbalized understanding.

## 2014-02-21 ENCOUNTER — Ambulatory Visit
Admission: RE | Admit: 2014-02-21 | Discharge: 2014-02-21 | Disposition: A | Discharge status: Home / Self Care | Payer: Medicare Other | Source: Ambulatory Visit | Attending: Radiation Oncology | Admitting: Radiation Oncology

## 2014-02-21 DIAGNOSIS — Z510 Encounter for antineoplastic radiation therapy: Secondary | ICD-10-CM | POA: Diagnosis not present

## 2014-02-22 ENCOUNTER — Ambulatory Visit
Admission: RE | Admit: 2014-02-22 | Discharge: 2014-02-22 | Disposition: A | Discharge status: Home / Self Care | Payer: Medicare Other | Source: Ambulatory Visit | Attending: Radiation Oncology | Admitting: Radiation Oncology

## 2014-02-22 DIAGNOSIS — Z510 Encounter for antineoplastic radiation therapy: Secondary | ICD-10-CM | POA: Diagnosis not present

## 2014-02-25 ENCOUNTER — Ambulatory Visit
Admission: RE | Admit: 2014-02-25 | Discharge: 2014-02-25 | Disposition: A | Discharge status: Home / Self Care | Payer: Medicare Other | Source: Ambulatory Visit | Attending: Radiation Oncology | Admitting: Radiation Oncology

## 2014-02-25 ENCOUNTER — Other Ambulatory Visit: Payer: Self-pay | Admitting: Radiation Oncology

## 2014-02-25 ENCOUNTER — Encounter: Payer: Self-pay | Admitting: Radiation Oncology

## 2014-02-25 VITALS — BP 104/56 | HR 53 | Temp 97.7°F | Resp 20 | Wt 150.5 lb

## 2014-02-25 DIAGNOSIS — R110 Nausea: Secondary | ICD-10-CM

## 2014-02-25 DIAGNOSIS — C50919 Malignant neoplasm of unspecified site of unspecified female breast: Secondary | ICD-10-CM

## 2014-02-25 DIAGNOSIS — Z510 Encounter for antineoplastic radiation therapy: Secondary | ICD-10-CM | POA: Diagnosis not present

## 2014-02-25 MED ORDER — PROMETHAZINE HCL 12.5 MG RE SUPP: 12.5000 mg | Freq: Four times a day (QID) | RECTAL | Status: DC | PRN

## 2014-02-25 NOTE — Progress Notes (Signed)
Weekly Management Note:  Site: Right chest wall Current Dose:  720  cGy Projected Dose: 4500  cGy  Narrative: The patient is seen today for routine under treatment assessment. CBCT/MVCT images/port films were reviewed. The chart was reviewed.   She has been nauseated all day. She denies abdominal pain, fever, or chills. No diarrhea or constipation. She uses Radioplex gel to her right chest wall.  Physical Examination:  Filed Vitals:   02/25/14 1524  BP: 104/56  Pulse: 53  Temp: 97.7 F (36.5 C)  Resp: 20  .  Weight: 150 lb 8 oz (68.266 kg). No change along her right chest wall. Abdomen is soft it is nontender.  Impression: Tolerating radiation therapy well, however, I cannot explain her nausea. I will give her Phenergan suppositories (12.5 mg because of her age) to use 1 every 6 hours when necessary. If she is not better by tomorrow she is to see her primary care physician.  Plan: Continue radiation therapy as planned.

## 2014-02-25 NOTE — Progress Notes (Addendum)
Pt denies pain, loss of appetite. She does report fatigue today and states "I just don't feel good." Pt had company over the weekend. She also states she has been nauseated x 2 days. Pt applying Radiaplex to right chest wall treatment area. Advised pt to come to nursing later this week or call her PCP if she develops other problems, symptoms.

## 2014-02-26 ENCOUNTER — Ambulatory Visit
Admission: RE | Admit: 2014-02-26 | Discharge: 2014-02-26 | Disposition: A | Discharge status: Home / Self Care | Payer: Medicare Other | Source: Ambulatory Visit | Attending: Radiation Oncology | Admitting: Radiation Oncology

## 2014-02-26 DIAGNOSIS — Z510 Encounter for antineoplastic radiation therapy: Secondary | ICD-10-CM | POA: Diagnosis not present

## 2014-02-27 ENCOUNTER — Ambulatory Visit
Admission: RE | Admit: 2014-02-27 | Discharge: 2014-02-27 | Disposition: A | Discharge status: Home / Self Care | Payer: Medicare Other | Source: Ambulatory Visit | Attending: Radiation Oncology | Admitting: Radiation Oncology

## 2014-02-27 DIAGNOSIS — Z510 Encounter for antineoplastic radiation therapy: Secondary | ICD-10-CM | POA: Diagnosis not present

## 2014-02-28 ENCOUNTER — Ambulatory Visit
Admission: RE | Admit: 2014-02-28 | Discharge: 2014-02-28 | Disposition: A | Discharge status: Home / Self Care | Payer: Medicare Other | Source: Ambulatory Visit | Attending: Radiation Oncology | Admitting: Radiation Oncology

## 2014-02-28 DIAGNOSIS — Z510 Encounter for antineoplastic radiation therapy: Secondary | ICD-10-CM | POA: Diagnosis not present

## 2014-03-01 ENCOUNTER — Ambulatory Visit
Admission: RE | Admit: 2014-03-01 | Discharge: 2014-03-01 | Disposition: A | Discharge status: Home / Self Care | Payer: Medicare Other | Source: Ambulatory Visit | Attending: Radiation Oncology | Admitting: Radiation Oncology

## 2014-03-01 DIAGNOSIS — Z510 Encounter for antineoplastic radiation therapy: Secondary | ICD-10-CM | POA: Diagnosis not present

## 2014-03-04 ENCOUNTER — Ambulatory Visit
Admission: RE | Admit: 2014-03-04 | Discharge: 2014-03-04 | Disposition: A | Discharge status: Home / Self Care | Payer: Medicare Other | Source: Ambulatory Visit | Attending: Radiation Oncology | Admitting: Radiation Oncology

## 2014-03-04 ENCOUNTER — Encounter: Payer: Self-pay | Admitting: Radiation Oncology

## 2014-03-04 VITALS — BP 144/69 | HR 62 | Temp 97.8°F | Resp 14 | Ht 62.0 in | Wt 154.1 lb

## 2014-03-04 DIAGNOSIS — Z510 Encounter for antineoplastic radiation therapy: Secondary | ICD-10-CM | POA: Diagnosis not present

## 2014-03-04 DIAGNOSIS — C50919 Malignant neoplasm of unspecified site of unspecified female breast: Secondary | ICD-10-CM

## 2014-03-04 MED ORDER — PROMETHAZINE HCL 25 MG PO TABS: 25.0000 mg | ORAL_TABLET | Freq: Four times a day (QID) | ORAL | Status: DC | PRN

## 2014-03-04 MED ORDER — ONDANSETRON 8 MG PO TBDP: 8.0000 mg | ORAL_TABLET | Freq: Three times a day (TID) | ORAL | Status: DC | PRN

## 2014-03-04 NOTE — Progress Notes (Signed)
   Weekly Management Note:  outpatient Current Dose:  16.2 Gy  Projected Dose: 45 Gy  initial  Narrative:  The patient presents for routine under treatment assessment.  CBCT/MVCT images/Port film x-rays were reviewed.  The chart was checked. Nauseous but weight is increased.  Phenergan suppository wasn't affordable  Physical Findings:  height is 5\' 2"  (1.575 m) and weight is 154 lb 1.6 oz (69.899 kg). Her oral temperature is 97.8 F (36.6 C). Her blood pressure is 144/69 and her pulse is 62. Her respiration is 14 and oxygen saturation is 100%.  NAD, erythema over right chest wall  Impression:  The patient is tolerating radiotherapy.Nausea.  Plan:  Continue radiotherapy as planned. Zofran and phenergan tablets Rx'd today.  ________________________________   Eppie Gibson, M.D.

## 2014-03-04 NOTE — Progress Notes (Signed)
Pt is on 9/25 of radiation treatments.    Pt complains of Fatigue, Generalized Weakness and also has been experiencing nausea.    Pt right breast upper outer quadrant negative for pruritus, rash and skin color change.  Noted mild pitting edema lower calf bilaterally.

## 2014-03-05 ENCOUNTER — Ambulatory Visit
Admission: RE | Admit: 2014-03-05 | Discharge: 2014-03-05 | Disposition: A | Discharge status: Home / Self Care | Payer: Medicare Other | Source: Ambulatory Visit | Attending: Radiation Oncology | Admitting: Radiation Oncology

## 2014-03-05 DIAGNOSIS — Z510 Encounter for antineoplastic radiation therapy: Secondary | ICD-10-CM | POA: Diagnosis not present

## 2014-03-06 ENCOUNTER — Ambulatory Visit
Admission: RE | Admit: 2014-03-06 | Discharge: 2014-03-06 | Disposition: A | Discharge status: Home / Self Care | Payer: Medicare Other | Source: Ambulatory Visit | Attending: Radiation Oncology | Admitting: Radiation Oncology

## 2014-03-06 DIAGNOSIS — Z510 Encounter for antineoplastic radiation therapy: Secondary | ICD-10-CM | POA: Diagnosis not present

## 2014-03-07 ENCOUNTER — Telehealth: Payer: Self-pay | Admitting: Oncology

## 2014-03-07 ENCOUNTER — Ambulatory Visit
Admission: RE | Admit: 2014-03-07 | Discharge: 2014-03-07 | Disposition: A | Discharge status: Home / Self Care | Payer: Medicare Other | Source: Ambulatory Visit | Attending: Radiation Oncology | Admitting: Radiation Oncology

## 2014-03-07 ENCOUNTER — Other Ambulatory Visit: Payer: Medicare Other

## 2014-03-07 DIAGNOSIS — Z510 Encounter for antineoplastic radiation therapy: Secondary | ICD-10-CM | POA: Diagnosis not present

## 2014-03-07 NOTE — Telephone Encounter (Signed)
s.w. pt and sched MD visit and gene...done...pt ok and aware

## 2014-03-08 ENCOUNTER — Ambulatory Visit
Admission: RE | Admit: 2014-03-08 | Discharge: 2014-03-08 | Disposition: A | Discharge status: Home / Self Care | Payer: Medicare Other | Source: Ambulatory Visit | Attending: Radiation Oncology | Admitting: Radiation Oncology

## 2014-03-08 DIAGNOSIS — Z510 Encounter for antineoplastic radiation therapy: Secondary | ICD-10-CM | POA: Diagnosis not present

## 2014-03-11 ENCOUNTER — Encounter: Payer: Self-pay | Admitting: Radiation Oncology

## 2014-03-11 ENCOUNTER — Ambulatory Visit
Admission: RE | Admit: 2014-03-11 | Discharge: 2014-03-11 | Disposition: A | Discharge status: Home / Self Care | Payer: Medicare Other | Source: Ambulatory Visit | Attending: Radiation Oncology | Admitting: Radiation Oncology

## 2014-03-11 VITALS — BP 121/75 | HR 65 | Resp 20 | Wt 150.4 lb

## 2014-03-11 DIAGNOSIS — Z510 Encounter for antineoplastic radiation therapy: Secondary | ICD-10-CM | POA: Diagnosis not present

## 2014-03-11 DIAGNOSIS — C50919 Malignant neoplasm of unspecified site of unspecified female breast: Secondary | ICD-10-CM

## 2014-03-11 NOTE — Progress Notes (Signed)
Weekly Management Note:  Site: Chest wall Current Dose:  2520  cGy Projected Dose: 4500  CGy, question boost  Narrative: The patient is seen today for routine under treatment assessment. CBCT/MVCT images/port films were reviewed. The chart was reviewed.   She is without complaints today except for some cramping along her right shoulder/chest wall.  Physical Examination:  Filed Vitals:   03/11/14 1557  BP: 121/75  Pulse: 65  Resp: 20  .  Weight: 150 lb 6.4 oz (68.221 kg). There is faint erythema along the right chest wall. There is no desquamation.  Impression: Tolerating radiation therapy well. She really has minimal erythema/dermatitis. We may consider giving her a 3-5 fraction boost after 4500 cGy.  Plan: Continue radiation therapy as planned.

## 2014-03-11 NOTE — Progress Notes (Signed)
Pt denies pain, loss of appetite. She is fatigued. Pt applying Radiaplex to right chest wall, slight pinkness of skin.

## 2014-03-12 ENCOUNTER — Ambulatory Visit
Admission: RE | Admit: 2014-03-12 | Discharge: 2014-03-12 | Disposition: A | Discharge status: Home / Self Care | Payer: Medicare Other | Source: Ambulatory Visit | Attending: Radiation Oncology | Admitting: Radiation Oncology

## 2014-03-12 DIAGNOSIS — Z510 Encounter for antineoplastic radiation therapy: Secondary | ICD-10-CM | POA: Diagnosis not present

## 2014-03-13 ENCOUNTER — Ambulatory Visit: Payer: Medicare Other | Admitting: Oncology

## 2014-03-13 ENCOUNTER — Ambulatory Visit
Admission: RE | Admit: 2014-03-13 | Discharge: 2014-03-13 | Disposition: A | Discharge status: Home / Self Care | Payer: Medicare Other | Source: Ambulatory Visit | Attending: Radiation Oncology | Admitting: Radiation Oncology

## 2014-03-13 DIAGNOSIS — Z510 Encounter for antineoplastic radiation therapy: Secondary | ICD-10-CM | POA: Diagnosis not present

## 2014-03-14 ENCOUNTER — Ambulatory Visit
Admission: RE | Admit: 2014-03-14 | Discharge: 2014-03-14 | Disposition: A | Discharge status: Home / Self Care | Payer: Medicare Other | Source: Ambulatory Visit | Attending: Radiation Oncology | Admitting: Radiation Oncology

## 2014-03-14 DIAGNOSIS — Z510 Encounter for antineoplastic radiation therapy: Secondary | ICD-10-CM | POA: Diagnosis not present

## 2014-03-15 ENCOUNTER — Ambulatory Visit
Admission: RE | Admit: 2014-03-15 | Discharge: 2014-03-15 | Disposition: A | Discharge status: Home / Self Care | Payer: Medicare Other | Source: Ambulatory Visit | Attending: Radiation Oncology | Admitting: Radiation Oncology

## 2014-03-15 DIAGNOSIS — R222 Localized swelling, mass and lump, trunk: Secondary | ICD-10-CM

## 2014-03-15 DIAGNOSIS — Z510 Encounter for antineoplastic radiation therapy: Secondary | ICD-10-CM | POA: Diagnosis not present

## 2014-03-15 MED ORDER — BIAFINE EX EMUL: CUTANEOUS | Status: DC | PRN

## 2014-03-15 MED ORDER — BIAFINE EX EMUL
CUTANEOUS | Status: DC | PRN
Start: 2014-03-15 — End: 2014-03-16
  Administered 2014-03-15: 16:00:00 via TOPICAL

## 2014-03-18 ENCOUNTER — Ambulatory Visit
Admission: RE | Admit: 2014-03-18 | Discharge: 2014-03-18 | Disposition: A | Discharge status: Home / Self Care | Payer: Medicare Other | Source: Ambulatory Visit | Attending: Radiation Oncology | Admitting: Radiation Oncology

## 2014-03-18 ENCOUNTER — Encounter: Payer: Self-pay | Admitting: Radiation Oncology

## 2014-03-18 VITALS — BP 108/73 | HR 58 | Temp 97.8°F | Resp 20 | Wt 154.3 lb

## 2014-03-18 DIAGNOSIS — Z510 Encounter for antineoplastic radiation therapy: Secondary | ICD-10-CM | POA: Diagnosis not present

## 2014-03-18 DIAGNOSIS — C50911 Malignant neoplasm of unspecified site of right female breast: Secondary | ICD-10-CM

## 2014-03-18 MED ORDER — BIAFINE EX EMUL
Freq: Two times a day (BID) | CUTANEOUS | Status: DC
  Administered 2014-03-18: 16:00:00 via TOPICAL

## 2014-03-18 NOTE — Progress Notes (Signed)
Weekly Management Note:  Site: Right chest wall Current Dose:  3420  cGy Projected Dose: 4500  cGy  Narrative: The patient is seen today for routine under treatment assessment. CBCT/MVCT images/port films were reviewed. The chart was reviewed.   She is without complaints today. She inquires about a prescription for an antidepressant. She uses Biafine cream when necessary. She does have mild to moderate fatigue.  Physical Examination:  Filed Vitals:   03/18/14 1535  BP: 108/73  Pulse: 58  Temp: 97.8 F (36.6 C)  Resp: 20  .  Weight: 154 lb 4.8 oz (69.99 kg). There is erythema the skin along the treatment volume along the right chest wall. There is no obvious desquamation.  Impression: Tolerating radiation therapy well. She states that she is depressed, and I suggested that she see her primary care physician for an antidepressant. She'll finish her treatment up early next week.  Plan: Continue radiation therapy as planned.

## 2014-03-18 NOTE — Progress Notes (Signed)
Patient states she had pain last night that she does not think is related to radiation. She is asking about a prescription for medication for her anxiety and depression; she will discuss w/Dr Valere Dross. Pt applying Biafine to right chest wall, has hyperpigmentation, no desquamation. Pt is fatigued.

## 2014-03-19 ENCOUNTER — Ambulatory Visit
Admission: RE | Admit: 2014-03-19 | Discharge: 2014-03-19 | Disposition: A | Discharge status: Home / Self Care | Payer: Medicare Other | Source: Ambulatory Visit | Attending: Radiation Oncology | Admitting: Radiation Oncology

## 2014-03-19 DIAGNOSIS — Z510 Encounter for antineoplastic radiation therapy: Secondary | ICD-10-CM | POA: Diagnosis not present

## 2014-03-20 ENCOUNTER — Ambulatory Visit
Admission: RE | Admit: 2014-03-20 | Discharge: 2014-03-20 | Disposition: A | Discharge status: Home / Self Care | Payer: Medicare Other | Source: Ambulatory Visit | Attending: Radiation Oncology | Admitting: Radiation Oncology

## 2014-03-20 ENCOUNTER — Ambulatory Visit: Payer: 59 | Admitting: Cardiology

## 2014-03-20 DIAGNOSIS — Z510 Encounter for antineoplastic radiation therapy: Secondary | ICD-10-CM | POA: Diagnosis not present

## 2014-03-21 ENCOUNTER — Encounter: Payer: Self-pay | Admitting: Radiation Oncology

## 2014-03-21 ENCOUNTER — Ambulatory Visit
Admission: RE | Admit: 2014-03-21 | Discharge: 2014-03-21 | Disposition: A | Discharge status: Home / Self Care | Payer: Medicare Other | Source: Ambulatory Visit | Attending: Radiation Oncology | Admitting: Radiation Oncology

## 2014-03-21 DIAGNOSIS — Z510 Encounter for antineoplastic radiation therapy: Secondary | ICD-10-CM | POA: Diagnosis not present

## 2014-03-21 NOTE — Progress Notes (Signed)
Weekly Management Note:  Site: Right chest wall Current Dose:  3960  cGy Projected Dose: 4500  cGy  Narrative: The patient is seen today for routine under treatment assessment. CBCT/MVCT images/port films were reviewed. The chart was reviewed.   She is without new complaints today. She is somewhat nauseated today.  Physical Examination: There were no vitals filed for this visit..  Weight:  . There is moderate erythema of the skin along her right chest wall with no significant desquamation.  Impression: Tolerating radiation therapy well. She'll finish radiation therapy next week.  Plan: Continue radiation therapy as planned. One-month followup after completion of radiation therapy.

## 2014-03-22 ENCOUNTER — Ambulatory Visit
Admission: RE | Admit: 2014-03-22 | Discharge: 2014-03-22 | Disposition: A | Discharge status: Home / Self Care | Payer: Medicare Other | Source: Ambulatory Visit | Attending: Radiation Oncology | Admitting: Radiation Oncology

## 2014-03-22 DIAGNOSIS — Z510 Encounter for antineoplastic radiation therapy: Secondary | ICD-10-CM | POA: Diagnosis not present

## 2014-03-25 ENCOUNTER — Ambulatory Visit: Payer: Medicare Other

## 2014-03-25 ENCOUNTER — Ambulatory Visit: Payer: Medicare Other | Admitting: Radiation Oncology

## 2014-03-26 ENCOUNTER — Ambulatory Visit
Admission: RE | Admit: 2014-03-26 | Discharge: 2014-03-26 | Disposition: A | Discharge status: Home / Self Care | Payer: Medicare Other | Source: Ambulatory Visit | Attending: Radiation Oncology | Admitting: Radiation Oncology

## 2014-03-26 ENCOUNTER — Ambulatory Visit: Payer: Medicare Other

## 2014-03-26 DIAGNOSIS — Z510 Encounter for antineoplastic radiation therapy: Secondary | ICD-10-CM | POA: Diagnosis not present

## 2014-03-27 ENCOUNTER — Ambulatory Visit: Payer: Medicare Other

## 2014-03-27 ENCOUNTER — Ambulatory Visit
Admission: RE | Admit: 2014-03-27 | Discharge: 2014-03-27 | Disposition: A | Discharge status: Home / Self Care | Payer: Medicare Other | Source: Ambulatory Visit | Attending: Radiation Oncology | Admitting: Radiation Oncology

## 2014-03-27 DIAGNOSIS — Z510 Encounter for antineoplastic radiation therapy: Secondary | ICD-10-CM | POA: Diagnosis not present

## 2014-03-27 DIAGNOSIS — C50911 Malignant neoplasm of unspecified site of right female breast: Secondary | ICD-10-CM

## 2014-03-27 MED ORDER — BIAFINE EX EMUL
Freq: Two times a day (BID) | CUTANEOUS | Status: DC
  Administered 2014-03-27: 15:00:00 via TOPICAL

## 2014-03-28 ENCOUNTER — Encounter: Payer: Self-pay | Admitting: Radiation Oncology

## 2014-03-28 NOTE — Progress Notes (Signed)
[facility] End of Treatment Note  Name:Angel [name]  Date: 03/28/2014 HUD:[id number] DOB:07-Dec-1931   Status:outpatient    CC: Unk Pinto DAVID, MD  Dr. Evlyn Clines  REFERRING PHYSICIAN:  Dr. 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                         PLAN: Routine followup in one month. Patient instructed to call if questions or worsening complaints in interim.

## 2014-03-29 ENCOUNTER — Other Ambulatory Visit: Payer: Self-pay | Admitting: Emergency Medicine

## 2014-03-29 ENCOUNTER — Emergency Department (HOSPITAL_BASED_OUTPATIENT_CLINIC_OR_DEPARTMENT_OTHER): Payer: Medicare Other

## 2014-03-29 ENCOUNTER — Emergency Department (HOSPITAL_BASED_OUTPATIENT_CLINIC_OR_DEPARTMENT_OTHER)
Admission: EM | Admit: 2014-03-29 | Discharge: 2014-03-29 | Disposition: A | Discharge status: Home / Self Care | Payer: Medicare Other | Attending: Emergency Medicine | Admitting: Emergency Medicine

## 2014-03-29 ENCOUNTER — Encounter (HOSPITAL_BASED_OUTPATIENT_CLINIC_OR_DEPARTMENT_OTHER): Payer: Self-pay | Admitting: Emergency Medicine

## 2014-03-29 DIAGNOSIS — I2510 Atherosclerotic heart disease of native coronary artery without angina pectoris: Secondary | ICD-10-CM | POA: Insufficient documentation

## 2014-03-29 DIAGNOSIS — J449 Chronic obstructive pulmonary disease, unspecified: Secondary | ICD-10-CM | POA: Insufficient documentation

## 2014-03-29 DIAGNOSIS — Z85828 Personal history of other malignant neoplasm of skin: Secondary | ICD-10-CM | POA: Insufficient documentation

## 2014-03-29 DIAGNOSIS — S92919A Unspecified fracture of unspecified toe(s), initial encounter for closed fracture: Secondary | ICD-10-CM | POA: Insufficient documentation

## 2014-03-29 DIAGNOSIS — J4489 Other specified chronic obstructive pulmonary disease: Secondary | ICD-10-CM | POA: Insufficient documentation

## 2014-03-29 DIAGNOSIS — I10 Essential (primary) hypertension: Secondary | ICD-10-CM | POA: Insufficient documentation

## 2014-03-29 DIAGNOSIS — Y929 Unspecified place or not applicable: Secondary | ICD-10-CM | POA: Insufficient documentation

## 2014-03-29 DIAGNOSIS — Z9889 Other specified postprocedural states: Secondary | ICD-10-CM | POA: Insufficient documentation

## 2014-03-29 DIAGNOSIS — E785 Hyperlipidemia, unspecified: Secondary | ICD-10-CM | POA: Insufficient documentation

## 2014-03-29 DIAGNOSIS — Z791 Long term (current) use of non-steroidal anti-inflammatories (NSAID): Secondary | ICD-10-CM | POA: Insufficient documentation

## 2014-03-29 DIAGNOSIS — Z79899 Other long term (current) drug therapy: Secondary | ICD-10-CM | POA: Insufficient documentation

## 2014-03-29 DIAGNOSIS — IMO0002 Reserved for concepts with insufficient information to code with codable children: Secondary | ICD-10-CM | POA: Insufficient documentation

## 2014-03-29 DIAGNOSIS — S92911A Unspecified fracture of right toe(s), initial encounter for closed fracture: Secondary | ICD-10-CM

## 2014-03-29 DIAGNOSIS — Z853 Personal history of malignant neoplasm of breast: Secondary | ICD-10-CM | POA: Insufficient documentation

## 2014-03-29 DIAGNOSIS — F411 Generalized anxiety disorder: Secondary | ICD-10-CM | POA: Insufficient documentation

## 2014-03-29 DIAGNOSIS — Z7982 Long term (current) use of aspirin: Secondary | ICD-10-CM | POA: Insufficient documentation

## 2014-03-29 DIAGNOSIS — F3289 Other specified depressive episodes: Secondary | ICD-10-CM | POA: Insufficient documentation

## 2014-03-29 DIAGNOSIS — M1990 Unspecified osteoarthritis, unspecified site: Secondary | ICD-10-CM | POA: Insufficient documentation

## 2014-03-29 DIAGNOSIS — F329 Major depressive disorder, single episode, unspecified: Secondary | ICD-10-CM | POA: Insufficient documentation

## 2014-03-29 DIAGNOSIS — Z8719 Personal history of other diseases of the digestive system: Secondary | ICD-10-CM | POA: Insufficient documentation

## 2014-03-29 DIAGNOSIS — Z923 Personal history of irradiation: Secondary | ICD-10-CM | POA: Insufficient documentation

## 2014-03-29 DIAGNOSIS — Z87442 Personal history of urinary calculi: Secondary | ICD-10-CM | POA: Insufficient documentation

## 2014-03-29 DIAGNOSIS — W208XXA Other cause of strike by thrown, projected or falling object, initial encounter: Secondary | ICD-10-CM | POA: Insufficient documentation

## 2014-03-29 DIAGNOSIS — Z880 Allergy status to penicillin: Secondary | ICD-10-CM | POA: Insufficient documentation

## 2014-03-29 DIAGNOSIS — Y9389 Activity, other specified: Secondary | ICD-10-CM | POA: Insufficient documentation

## 2014-03-29 MED ORDER — OXYCODONE-ACETAMINOPHEN 5-325 MG PO TABS: 1.0000 | ORAL_TABLET | Freq: Four times a day (QID) | ORAL | Status: DC | PRN

## 2014-03-29 NOTE — ED Provider Notes (Signed)
CSN: [id number]     Arrival date & time 03/29/14  1201 History   First MD Initiated Contact with Patient 03/29/14 1218     Chief Complaint  Patient presents with  . Foot Pain     (Consider location/radiation/quality/duration/timing/severity/associated sxs/prior Treatment) Patient is a 79 y.o. female presenting with lower extremity pain. The history is provided by the patient.  Foot Pain This is a new problem. Episode onset: 6 days. The problem occurs constantly. The problem has not changed since onset.Associated symptoms comments: Planting flowers and dropped a clay pot on her foot 6 days ago. The symptoms are aggravated by walking. The symptoms are relieved by rest. She has tried rest for the symptoms. The treatment provided mild relief.    Past Medical History  Diagnosis Date  . Asthma   . Hyperlipemia   . Spinal stenosis   . Osteoporosis   . Chronic bronchitis   . Kidney stones   . DDD (degenerative disc disease)   . Osteoarthritis   . Degenerative joint disease   . Skin cancer of face     non melanoma  . Hypertension   . Coronary artery disease Non-obstructive    a. 2012 Cath: LAD 40-29m->Med Rx;  b. 11/2013 Echo: EF 50-55%, nl wall motion;  c. 12/2013 Lexi MV: EF 74%, no ischemia.  . Depression   . Anxiety   . COPD (chronic obstructive pulmonary disease)   . Dyslipidemia   . Cholelithiasis   . GERD (gastroesophageal reflux disease)   . Hx of radiation therapy 07/02/1999- 08/11/1999    right supraclavucular/axillary region: 5040 cGy, 28 fractions  . Breast cancer 05/07/1999, 06/2011    a. s/p bilat mastectomies. recurrence 01/2014   Past Surgical History  Procedure Laterality Date  . Vesicovaginal fistula closure w/ tah  age 72  . Tubal ligation  1961  . Appendectomy    . Abdominal hysterectomy      in her 30's  . Small intestine surgery  1998    SBO  . Tonsillectomy    . Breast lumpectomy  2002    right breast  . Mastectomy  07/29/11    bilateral-rt nodes-none on  left  . Cardiac catheterization  03/2011, 3/15  . Breast biopsy Right 01/21/2014    Procedure: BREAST/CHEST WALL BIOPSY;  Surgeon: [name], MD;  Location: [facility];  Service: General;  Laterality: Right;  . Vein ligation and stripping Right    Family History  Problem Relation Age of Onset  . Emphysema Mother   . Heart disease Mother   . Pancreatic cancer Mother   . Cancer Mother     pancreatic  . Asthma Brother   . Asthma Daughter   . Asthma Grandchild   . Asthma Brother     deceased  . Heart disease Father   . Heart disease Brother   . Lymphoma Brother    History  Substance Use Topics  . Smoking status: Never Smoker   . Smokeless tobacco: Never Used  . Alcohol Use: Yes     Comment: occ wine   OB History   Grav Para Term Preterm Abortions TAB SAB Ect Mult Living                 Review of Systems  All other systems reviewed and are negative.     Allergies  Adhesive and Penicillins  Home Medications   Prior to Admission medications   Medication Sig Start Date End Date Taking? Authorizing Provider  albuterol (  PROVENTIL HFA;VENTOLIN HFA) 108 (90 BASE) MCG/ACT inhaler Inhale into the lungs every 6 (six) hours as needed for wheezing or shortness of breath.    Historical Provider, MD  aspirin 325 MG EC tablet Take 325 mg by mouth.    Historical Provider, MD  atorvastatin (LIPITOR) 80 MG tablet Take 80 mg by mouth daily at 6 PM.  12/08/13   Historical Provider, MD  Calcium-Magnesium-Zinc 1000-400-15 MG TABS Take 1 tablet by mouth daily.      Historical Provider, MD  celecoxib (CELEBREX) 200 MG capsule Take 200 mg by mouth daily.  10/24/13   Historical Provider, MD  Cholecalciferol (VITAMIN D-3) 5000 UNITS TABS Take 1 tablet by mouth daily.     Historical Provider, MD  citalopram (CELEXA) 20 MG tablet Take 1 tablet by mouth  every day for mood 01/15/14   Melissa R Smith, PA-C  diazepam (VALIUM) 5 MG tablet Take 5 mg by mouth every 6 (six) hours as needed for anxiety.     Historical Provider, MD  DULoxetine (CYMBALTA) 60 MG capsule Take 1 capsule by mouth  every day for mood 01/15/14   Melissa R Smith, PA-C  ezetimibe (ZETIA) 10 MG tablet Take 10 mg by mouth daily.    Historical Provider, MD  Fish Oil OIL Take 100 mg by mouth 3 (three) times daily.    Historical Provider, MD  Flaxseed, Linseed, (FLAX SEED OIL PO) Take 5 mLs by mouth daily. 1 tsp daily    Historical Provider, MD  hyaluronate sodium (RADIAPLEXRX) GEL Apply 1 application topically 2 (two) times daily.    Historical Provider, MD  HYDROcodone-acetaminophen (NORCO) 10-325 MG per tablet Take 1 tablet by mouth every 4 (four) hours as needed. For pain    Historical Provider, MD  metoprolol tartrate (LOPRESSOR) 25 MG tablet Take 12.5 mg by mouth 2 (two) times daily.    Historical Provider, MD  mometasone (NASONEX) 50 MCG/ACT nasal spray Place 2 sprays into the nose daily. 08/08/13   Melissa R Smith, PA-C  montelukast (SINGULAIR) 10 MG tablet Take 10 mg by mouth at bedtime.    Historical Provider, MD  Multiple Vitamins-Minerals (MULTIVITAMINS THER. W/MINERALS) TABS Take 1 tablet by mouth daily.      Historical Provider, MD  nitroGLYCERIN (NITROSTAT) 0.4 MG SL tablet Place 1 tablet (0.4 mg total) under the tongue every 5 (five) minutes x 3 doses as needed for chest pain. 01/19/14   Rogelia Mire, NP  ondansetron (ZOFRAN ODT) 8 MG disintegrating tablet Take 1 tablet (8 mg total) by mouth every 8 (eight) hours as needed for nausea or vomiting. 03/04/14   Eppie Gibson, MD  promethazine (PHENERGAN) 25 MG tablet Take 1 tablet (25 mg total) by mouth every 6 (six) hours as needed for nausea or vomiting. 03/04/14   Eppie Gibson, MD  raloxifene (EVISTA) 60 MG tablet Take 60 mg by mouth daily.    Historical Provider, MD  SYMBICORT 160-4.5 MCG/ACT inhaler Inhale 1 puff into the lungs daily. 12/09/13   Historical Provider, MD   BP 131/73  Pulse 70  Temp(Src) 97.7 F (36.5 C) (Oral)  Ht 5\' 2"  (1.575 m)  Wt 140 lb (63.504  kg)  BMI 25.60 kg/m2  SpO2 100% Physical Exam  Nursing note and vitals reviewed. Constitutional: She is oriented to person, place, and time. She appears well-developed and well-nourished. No distress.  HENT:  Head: Normocephalic and atraumatic.  Cardiovascular: Normal rate.   Pulmonary/Chest: Effort normal.  Musculoskeletal:  Right ankle: Normal.       Feet:  Neurological: She is alert and oriented to person, place, and time.  Skin: Skin is warm and dry.  Psychiatric: She has a normal mood and affect. Her behavior is normal.    ED Course  Procedures (including critical care time) Labs Review Labs Reviewed - No data to display  Imaging Review Dg Foot Complete Right  03/29/2014   CLINICAL DATA:  Crush injury 1 week ago.  Metatarsal pain.  EXAM: RIGHT FOOT COMPLETE - 3+ VIEW  COMPARISON:  None.  FINDINGS: No fracture of the metatarsals. There is a fracture of the distal metaphysis of the proximal phalanx of the fourth toe.  IMPRESSION: Fracture of the distal metaphysis of the proximal phalanx of the fourth toe.   Electronically Signed   By: Nelson Chimes M.D.   On: 03/29/2014 12:44     EKG Interpretation None      MDM   Final diagnoses:  Toe fracture, right, closed, initial encounter   Patient with an injury to her foot after dropping a flowerpot on it. It jean date shows a fracture of the fourth distal portion of the proximal phalanx without other injury. Patient has no ankle or leg pain. She's been able to ambulate and has been using a postop shoe.    Blanchie Dessert, MD 03/29/14 1320

## 2014-03-29 NOTE — ED Notes (Signed)
Pt dropped a large flower pot on her right foot on Saturday and pain is not resolving. Right 2nd and 3rd toes are bruised.

## 2014-04-02 ENCOUNTER — Other Ambulatory Visit: Payer: Self-pay | Admitting: *Deleted

## 2014-04-02 MED ORDER — DULOXETINE HCL 60 MG PO CPEP: ORAL_CAPSULE | ORAL | Status: DC

## 2014-04-03 ENCOUNTER — Other Ambulatory Visit: Payer: Self-pay | Admitting: Internal Medicine

## 2014-04-03 ENCOUNTER — Other Ambulatory Visit: Payer: Medicare Other

## 2014-04-07 ENCOUNTER — Other Ambulatory Visit: Payer: Self-pay | Admitting: Oncology

## 2014-04-07 DIAGNOSIS — C50911 Malignant neoplasm of unspecified site of right female breast: Secondary | ICD-10-CM

## 2014-04-09 ENCOUNTER — Other Ambulatory Visit: Payer: Self-pay | Admitting: Emergency Medicine

## 2014-04-10 ENCOUNTER — Ambulatory Visit (HOSPITAL_BASED_OUTPATIENT_CLINIC_OR_DEPARTMENT_OTHER): Payer: Medicare Other | Admitting: Oncology

## 2014-04-10 ENCOUNTER — Telehealth: Payer: Self-pay | Admitting: Oncology

## 2014-04-10 ENCOUNTER — Encounter: Payer: Self-pay | Admitting: Oncology

## 2014-04-10 VITALS — BP 107/65 | HR 60 | Temp 98.0°F | Resp 20 | Ht 62.0 in | Wt 150.6 lb

## 2014-04-10 DIAGNOSIS — Z853 Personal history of malignant neoplasm of breast: Secondary | ICD-10-CM

## 2014-04-10 DIAGNOSIS — C44509 Unspecified malignant neoplasm of skin of other part of trunk: Secondary | ICD-10-CM

## 2014-04-10 DIAGNOSIS — M810 Age-related osteoporosis without current pathological fracture: Secondary | ICD-10-CM

## 2014-04-10 DIAGNOSIS — C50911 Malignant neoplasm of unspecified site of right female breast: Secondary | ICD-10-CM

## 2014-04-10 DIAGNOSIS — C44599 Other specified malignant neoplasm of skin of other part of trunk: Secondary | ICD-10-CM

## 2014-04-10 NOTE — Progress Notes (Signed)
OFFICE PROGRESS NOTE   04/10/2014   Physicians:W.McKeown, T.Gerkin, R.Ramos, P.Wright, T.Turner, R.Murray   INTERVAL HISTORY:  Patient is seen, alone for visit, in follow up of chest wall recurrence of right breast cancer, post excisional biopsy 01-21-14 with involved margin(s) and + LVSI, and radiation by Dr Valere Dross given 5-27 thru 03-27-14. PET scan 02-15-14 did not identify any involvement other than right chest wall. She tolerated the radiation generally well, and has follow up with Dr Valere Dross in early August. Patient continues Evista, which she has taken since sometime after 09-2011, but will stop this now. Last bone density scan from outside records was 05-2011, not completely scanned into this EMR but seems to have Tscores for LS spine of -2.0 to -2.9. Patient agrees to having bone density repeated, as this is important considering hormonal blockade for the chest wall recurrence of breast cancer. She has no new or different pain. She is fatigued at times, not really worse with RT, possibly related to recent changes in cardiac medications. She denies specific SOB. Appetite is fair. No GI complaints. No LE swelling. She is to see Dr [name] for PE also early August and Dr Fransico Him later in August.   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 She had a second right breast cancer in 2012, with right mastectomy by Dr [name] 07-29-2011 for T1N0M0 ER/PR positive, HER-2 negative invasive ductal carcinoma; she also had what was planned as prophylactic left mastectomy, with unexpected finding of left DCIS in that pathology. Plan in 09-2011 was to begin tamoxifen, chosen particularly due to low bone density. Patient did not keep follow up visits at this office, stopped tamoxifen due intolerance (tho she does not remember what problem she had with this) and was treated with  raloxifene by Dr [name].  She was seen next at this office on 12-26-2013 with an enlarging nodular area above right mastectomy scar,reportedly present for several months. Excisional biopsy done 01-21-14 found grade 2 invasive ductal carcinoma with involvement at [facility]) and lymphovascular space invasion, ER positive 100%, PR positive 75%, proliferation marker 80% and HER-2 negative by [facility] (pathology [phone]). She had 4500 cGy from 5-27 thru 03-27-14 to right chest wall.  Review of systems as above, also: No bleeding. Pain improving right foot where fractured 4th toe when dropped flower pot onto foot 03-29-14. Bowels ok. Remainder of 10 point Review of Systems negative.  Objective:  Vital signs in last 24 hours:  BP 107/65  Pulse 60  Temp(Src) 98 F (36.7 C) (Oral)  Resp 20  Ht '5\' 2"'  (1.575 m)  Wt 150 lb 9.6 oz (68.312 kg)  BMI 27.54 kg/m2 Weight is up ~ 2 lbs. Alert, oriented and appropriate. Ambulatory without assistance, regular shoes.[name]   HEENT:PERRL, sclerae not icteric. Oral mucosa moist without lesions, posterior pharynx clear.  Neck supple. No JVD.  Lymphatics:no cervical,suraclavicular, axillary adenopathy Resp: clear to auscultation bilaterally and normal percussion bilaterally Cardio: Somewhat irregular rate of 60/min. Clear heart sounds. No gallop. GI: soft, nontender, not distended, no mass or organomegaly. Normally active bowel sounds. Surgical incision not remarkable. Musculoskeletal/ Extremities: without pitting edema, cords, tenderness other than distal right foot, still resolving ecchymosis there. No lymphedema bilateral UE. Neuro: speech fluent and appropriate. CN, motor, cerebellar, sensory without focal deficits. PSYCH mood and affect appropriate Skin without rash, ecchymosis, petechiae otherwise Breasts: Bilateral mastectomies, right chest wall with erythema  in circular RT field, no desquamation or unusual tenderness, no new areas of concern right chest ;left not  remarkable. Axillae benign.  Lab Results: CA 2729 and CEA not elevated 01-31-2014.   Studies/Results: NUCLEAR MEDICINE PET SKULL BASE TO THIGH  TECHNIQUE:  8.3 mCi F-18 FDG was injected intravenously. Full-ring PET imaging  was performed from the skull base to thigh after the radiotracer. CT  data was obtained and used for attenuation correction and anatomic  localization.  FASTING BLOOD GLUCOSE: Value: 87 mg/dl  COMPARISON: None.  FINDINGS:  NECK  No hypermetabolic lymph nodes in the neck.  CHEST  There is focus hypermetabolic activity along the right anterior  chest wall at the mastectomy site. There is mild skin thickening at  this level (image 74, series 4). There are no hypermetabolic  axillary lymph nodes. No hypermetabolic supraclavicular internal  mammary lymph nodes. No hypermetabolic mediastinal lymph nodes.  There are no suspicious pulmonary nodules  ABDOMEN/PELVIS  No abnormal hypermetabolic activity within the liver, pancreas,  adrenal glands, or spleen. No hypermetabolic lymph nodes in the  abdomen or pelvis.  SKELETON  No focal hypermetabolic activity to suggest skeletal metastasis.  IMPRESSION:  1. Hypermetabolic focus along the right chest wall. Recommend  clinical correlation for local breast cancer recurrence.  2. No evidence of axillary nodal metastasis common mediastinal  metastasis, or distant metastasis.  3. No pulmonary metastasis or liver metastasis.  4. No skeletal metastasis  PET images reviewed by MD in PACS and discussed again with patient now.   Medications: I have reviewed the patient's current medications. Stop Evista  DISCUSSION: I have encouraged her to keep all MD appointments upcoming. She will stop Evista, as the recurrence developed while on that medication, and will be set up for bone density scan. I have told her that hormonal blocker would be appropriate additional coverage for this recurrent cancer, choice depending on bone density  information; note she previously "did not tolerate" tamoxifen after very brief time, details not clear to me and patient does not remember problems with that medication. Patient tells me that she and daughters decided against genetics testing.  Assessment/Plan: 1.invasive ductal carcinoma of right breast ER PR +/ HER-2 negative recurrent on right chest wall: post excisional biopsy with positive margins, post local radiation as above. Additional coverage with hormonal blocker is appropriate from standpoint of the cancer, and we will try to reach a decision about this at return visit after bone density scan.  2.cardiac disease, with recent unstable angina and frequent PVCs, CAD stable by cath 11-2013. This problem and medications may be contributing to or causing her fatigue. 3.History of bilateral DCIS and invasive ductal carcinoma of right breast: history as above  4.osteoporosis by bone density 2012. Has been on Evista from after 09-2011 until now. 5.chronic bronchitis and asthma  6.remote hysterectomy without oophorectomy  5.degenerative arthritis and spinal stenosis.  Patient seemed to understand discussion and to be in agreement with plan. Time spent 25 min including >50% counseling and coordination of care  Kealani Leckey P, MD   04/10/2014, 2:48 PM

## 2014-04-10 NOTE — Telephone Encounter (Signed)
per pof to sch pt appt & BD @ [facility] pt time & date for appt-gave pt copy of sch

## 2014-04-15 ENCOUNTER — Other Ambulatory Visit: Payer: Self-pay | Admitting: *Deleted

## 2014-04-15 MED ORDER — MONTELUKAST SODIUM 10 MG PO TABS: 10.0000 mg | ORAL_TABLET | Freq: Every day | ORAL | Status: DC

## 2014-04-16 ENCOUNTER — Other Ambulatory Visit: Payer: Self-pay | Admitting: *Deleted

## 2014-04-16 MED ORDER — MOMETASONE FUROATE 50 MCG/ACT NA SUSP: 2.0000 | Freq: Every day | NASAL | Status: DC

## 2014-04-17 ENCOUNTER — Telehealth: Payer: Self-pay | Admitting: Oncology

## 2014-04-17 NOTE — Telephone Encounter (Signed)
Medical Oncology  Per EMR, patient has signed allowing access to [place] to daughters Shawn Route. Daughter Cathie Hoops called requesting additional information on stage of patient's cancer, as she did not come to last visit. I explained that this is technically stage IV disease with involvement outside of breast and of lymph nodes, tho imaging does not show other distant disease, and we are hopeful that local measures + hormonal blockade will give good control. I told daughter dates and times of upcoming bone density scan and of return appointment to this office. She plans to come with patient to next visit. Daughter appreciated call.  Godfrey Pick, MD

## 2014-04-25 ENCOUNTER — Encounter: Payer: Self-pay | Admitting: *Deleted

## 2014-04-29 ENCOUNTER — Ambulatory Visit (HOSPITAL_COMMUNITY): Payer: Medicare Other

## 2014-04-30 ENCOUNTER — Encounter: Payer: Self-pay | Admitting: Radiation Oncology

## 2014-04-30 ENCOUNTER — Ambulatory Visit
Admission: RE | Admit: 2014-04-30 | Discharge: 2014-04-30 | Disposition: A | Discharge status: Home / Self Care | Payer: Medicare Other | Source: Ambulatory Visit | Attending: Radiation Oncology | Admitting: Radiation Oncology

## 2014-04-30 VITALS — BP 125/57 | HR 63 | Temp 97.4°F | Resp 20 | Wt 144.0 lb

## 2014-04-30 DIAGNOSIS — C50911 Malignant neoplasm of unspecified site of right female breast: Secondary | ICD-10-CM

## 2014-04-30 NOTE — Progress Notes (Signed)
CC: Dr. Evlyn Clines, Dr. Unk Pinto, Dr. Armandina Gemma  Followup note:  Ms. 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 She is without complaints today.  Physical examination: Alert and oriented. Filed Vitals:   04/30/14 1152  BP: 125/57  Pulse: 63  Temp: 97.4 F (36.3 C)  Resp: 20   Nodes: There is no palpable cervical, supraclavicular, or axillary lymphadenopathy. Chest: There is residual hyperpigmentation over an 8 x 8 cm area along her right upper chest wall where she had postoperative radiation therapy. There are also numerous (20-30) small nodules, approaching 4-5 mm in greatest diameter, primarily within her field of radiation therapy, but a few inferomedially as well. There is no ulceration. These nodular certainly have the appearance of recurrent disease.  Impression: I suspect that she has disease progression which developed over the past few weeks. Better than 90% of the nodules are within her previous radiation therapy field, and I find this to be somewhat unusual with this area recently receiving 4500 cGy in 25 sessions. She does have an aggressive cancer based on her Ki-67 of 80%. She is scheduled see Dr. Marko Plume for a followup visit on August 17. Her tumor was ER/PR positive, and hopefully she will respond to anti-estrogen therapy. I defer to Dr. Marko Plume to see if she would like to biopsy one of these lesions.  Plan: As above.

## 2014-04-30 NOTE — Progress Notes (Signed)
Patient states she has occasional pain in her mid rib cage. She denies other pain. Her skin of right chest wall is healing well, some pinkness remains. Advised she apply lotion with vitamin E. She states she is fatigued, no loss of appetite.  She has physical with her PCP this week.

## 2014-05-01 ENCOUNTER — Encounter: Payer: Self-pay | Admitting: Internal Medicine

## 2014-05-01 ENCOUNTER — Ambulatory Visit (INDEPENDENT_AMBULATORY_CARE_PROVIDER_SITE_OTHER): Payer: Medicare Other | Admitting: Internal Medicine

## 2014-05-01 VITALS — BP 142/76 | HR 60 | Temp 99.1°F | Resp 16 | Ht 62.0 in | Wt 146.4 lb

## 2014-05-01 DIAGNOSIS — R7309 Other abnormal glucose: Secondary | ICD-10-CM

## 2014-05-01 DIAGNOSIS — Z79899 Other long term (current) drug therapy: Secondary | ICD-10-CM

## 2014-05-01 DIAGNOSIS — E782 Mixed hyperlipidemia: Secondary | ICD-10-CM

## 2014-05-01 DIAGNOSIS — Z0000 Encounter for general adult medical examination without abnormal findings: Secondary | ICD-10-CM

## 2014-05-01 DIAGNOSIS — I10 Essential (primary) hypertension: Secondary | ICD-10-CM

## 2014-05-01 DIAGNOSIS — E559 Vitamin D deficiency, unspecified: Secondary | ICD-10-CM

## 2014-05-01 DIAGNOSIS — Z1212 Encounter for screening for malignant neoplasm of rectum: Secondary | ICD-10-CM

## 2014-05-01 DIAGNOSIS — Z1331 Encounter for screening for depression: Secondary | ICD-10-CM

## 2014-05-01 DIAGNOSIS — Z789 Other specified health status: Secondary | ICD-10-CM

## 2014-05-01 DIAGNOSIS — R7303 Prediabetes: Secondary | ICD-10-CM | POA: Insufficient documentation

## 2014-05-01 LAB — HEPATIC FUNCTION PANEL
ALK PHOS: 45 U/L (ref 39–117)
ALT: 11 U/L (ref 0–35)
AST: 16 U/L (ref 0–37)
Albumin: 4 g/dL (ref 3.5–5.2)
BILIRUBIN DIRECT: 0.1 mg/dL (ref 0.0–0.3)
BILIRUBIN INDIRECT: 0.4 mg/dL (ref 0.2–1.2)
BILIRUBIN TOTAL: 0.5 mg/dL (ref 0.2–1.2)
TOTAL PROTEIN: 6.1 g/dL (ref 6.0–8.3)

## 2014-05-01 LAB — LIPID PANEL
Cholesterol: 204 mg/dL — ABNORMAL HIGH (ref 0–200)
HDL: 52 mg/dL (ref >39)
LDL Cholesterol: 130 mg/dL — ABNORMAL HIGH (ref 0–99)
TRIGLYCERIDES: 109 mg/dL (ref <150)
Total CHOL/HDL Ratio: 3.9 Ratio
VLDL: 22 mg/dL (ref 0–40)

## 2014-05-01 LAB — BASIC METABOLIC PANEL WITH GFR
BUN: 14 mg/dL (ref 6–23)
CHLORIDE: 102 meq/L (ref 96–112)
CO2: 27 mEq/L (ref 19–32)
Calcium: 9.4 mg/dL (ref 8.4–10.5)
Creat: 0.78 mg/dL (ref 0.50–1.10)
GFR, EST AFRICAN AMERICAN: 84 mL/min
GFR, Est Non African American: 73 mL/min
Glucose, Bld: 104 mg/dL — ABNORMAL HIGH (ref 70–99)
POTASSIUM: 5 meq/L (ref 3.5–5.3)
Sodium: 138 mEq/L (ref 135–145)

## 2014-05-01 LAB — CBC WITH DIFFERENTIAL/PLATELET
Basophils Absolute: 0 10*3/uL (ref 0.0–0.1)
Basophils Relative: 0 % (ref 0–1)
Eosinophils Absolute: 0 10*3/uL (ref 0.0–0.7)
Eosinophils Relative: 0 % (ref 0–5)
HEMATOCRIT: 40.1 % (ref 36.0–46.0)
HEMOGLOBIN: 13.5 g/dL (ref 12.0–15.0)
LYMPHS PCT: 45 % (ref 12–46)
Lymphs Abs: 2.4 10*3/uL (ref 0.7–4.0)
MCH: 29.8 pg (ref 26.0–34.0)
MCHC: 33.7 g/dL (ref 30.0–36.0)
MCV: 88.5 fL (ref 78.0–100.0)
MONO ABS: 0.4 10*3/uL (ref 0.1–1.0)
Monocytes Relative: 7 % (ref 3–12)
NEUTROS PCT: 48 % (ref 43–77)
Neutro Abs: 2.5 10*3/uL (ref 1.7–7.7)
Platelets: 230 10*3/uL (ref 150–400)
RBC: 4.53 MIL/uL (ref 3.87–5.11)
RDW: 13.7 % (ref 11.5–15.5)
WBC: 5.3 10*3/uL (ref 4.0–10.5)

## 2014-05-01 LAB — HEMOGLOBIN A1C
Hgb A1c MFr Bld: 5.6 % (ref <5.7)
MEAN PLASMA GLUCOSE: 114 mg/dL (ref <117)

## 2014-05-01 LAB — TSH: TSH: 2.082 u[IU]/mL (ref 0.350–4.500)

## 2014-05-01 LAB — MAGNESIUM: Magnesium: 2 mg/dL (ref 1.5–2.5)

## 2014-05-01 MED ORDER — CLORAZEPATE DIPOTASSIUM 7.5 MG PO TABS
ORAL_TABLET | ORAL | Status: DC
Start: 2014-05-01 — End: 2014-08-28

## 2014-05-01 NOTE — Patient Instructions (Addendum)
   Recommend the book "The END of DIETING" by Dr Baker Janus   and the book "The END of DIABETES " by Dr Excell Seltzer  At [email or web address] - get book & Audio CD's

## 2014-05-01 NOTE — Progress Notes (Signed)
Patient ID: Angel [name], female   DOB: 03-08-1932, 80 y.o.   MRN: [id number]   Annual Screening Comprehensive Examination  This very nice 80 y.o.DWF presents for complete physical.  Patient has been followed for labile HTN,  Prediabetes, Hyperlipidemia, and Vitamin D Deficiency.   Patient has pertinent hx/o 2 separate breast cancers with the 1st by rt lumpectomy in 2000. The 2sd was in 2012 treated by Dbl mastectomy. She recently completed a course of radiation for Rt chest wall recurrence and saw Dr Valere Dross yesterday & was thought to still have apparent new local recurrence around the treated area. Patient is followed also very closely by Dr Marko Plume.    Labile HTN predates since 2010 and has been followed expectantly. In May 2015, she was hospitalized by Dr Fransico Him and had a non-ischemic Myoview and no cardiac meds were initiated. Patient's BP has been controlled at home and patient denies any cardiac symptoms as chest pain, palpitations, shortness of breath, dizziness or ankle swelling. Today's BP: 142/76 mmHg.    Patient's hyperlipidemia is controlled with diet and medications. Patient denies myalgias or other medication SE's. Last lipids were 01/18/2014: Cholesterol, Total 181; HDL Cholesterol by NMR 46; LDL (calc) 113*; Triglycerides 109 .  Patient has prediabetes and patient denies reactive hypoglycemic symptoms, visual blurring, diabetic polys, or paresthesias. Last A1c was 5.7% in Oct 2014.    Finally, patient has history of Vitamin D Deficiency and last Vitamin D was 91 in Oct 2014.  Medication Sig  . albuterol  inhaler Inhale  every 6  hours as needed  . aspirin 325 MG EC tablet Take 325 mg by mouth.  [name] atorvastatin  80 MG tablet Take 80 mg by mouth daily .   . Cal-Mag-Zn 1000-400-15 MG  Take 1 tablet by mouth daily.    . celecoxib  200 MG cap Take 200 mg by mouth daily.   [name] VITAMIN D 5000 UNITS  Take 1 tablet by mouth daily.   . DULoxetine  60 MG capsule Take 1 capsule by  mouth  every day for mood  . ezetimibe 10 MG tablet Take 10 mg by mouth daily.  . Fish Oil OIL Take 100 mg by mouth 3  times daily.  [name] FLAX SEED OIL  Take 5 mLs by mouth daily  .  NORCO 10-325 MG Take 1 tablet  every 4  hours as needed  . metoprolol tartrate  25 MG tablet Take 12.5 mg by mouth 2  times daily.  . mometasone  nasal spray Place 2 sprays into the nose daily.  . montelukast  10 MG tablet Take 1 tablet  at bedtime.  . Multiple Vitamins-Minerals  Take 1 tablet by mouth daily.    [name] NITROSTAT 0.4  Place 1 tablet as needed  . raloxifene  60 MG tablet Take 60 mg by mouth daily.  . SYMBICORT 160-4.5  inhaler Inhale 1 puff into the lungs daily.   Allergies  Allergen Reactions  . Adhesive [Tape]   . Penicillins Rash   Past Medical History  Diagnosis Date  . Asthma   . Hyperlipemia   . Spinal stenosis   . Osteoporosis   . Chronic bronchitis   . Kidney stones   . DDD (degenerative disc disease)   . Osteoarthritis   . Degenerative joint disease   . Skin cancer of face     non melanoma  . Hypertension   . Coronary artery disease Non-obstructive    a. 2012 Cath: LAD  40-37m->Med Rx;  b. 11/2013 Echo: EF 50-55%, nl wall motion;  c. 12/2013 Lexi MV: EF 74%, no ischemia.  . Depression   . Anxiety   . COPD (chronic obstructive pulmonary disease)   . Dyslipidemia   . Cholelithiasis   . GERD (gastroesophageal reflux disease)   . Hx of radiation therapy 07/02/1999- 08/11/1999    right supraclavucular/axillary region: 5040 cGy, 28 fractions  . Breast cancer 05/07/1999, 06/2011    a. s/p bilat mastectomies. recurrence 01/2014  . Hx of radiation therapy 02/20/14- 03/27/14    right chest wall 4500 cGy 25 sessions   Past Surgical History  Procedure Laterality Date  . Vesicovaginal fistula closure w/ tah  age 56  . Tubal ligation  1961  . Appendectomy    . Abdominal hysterectomy      in her 30's  . Small intestine surgery  1998    SBO  . Tonsillectomy    . Breast lumpectomy  2002     right breast  . Mastectomy  07/29/11    bilateral-rt nodes-none on left  . Cardiac catheterization  03/2011, 3/15  . Breast biopsy Right 01/21/2014    Procedure: BREAST/CHEST WALL BIOPSY;  Surgeon: [name], MD;  Location: [facility];  Service: General;  Laterality: Right;  . Vein ligation and stripping Right    Family History  Problem Relation Age of Onset  . Emphysema Mother   . Heart disease Mother   . Pancreatic cancer Mother   . Cancer Mother     pancreatic  . Asthma Brother   . Asthma Daughter   . Asthma Grandchild   . Asthma Brother     deceased  . Heart disease Father   . Heart disease Brother   . Lymphoma Brother    History  Substance Use Topics  . Smoking status: Never Smoker   . Smokeless tobacco: Never Used  . Alcohol Use: Yes     Comment: occ wine    ROS Constitutional: Denies fever, chills, weight loss/gain, headaches, insomnia, fatigue, night sweats, and change in appetite. Eyes: Denies redness, blurred vision, diplopia, discharge, itchy, watery eyes.  ENT: Denies discharge, congestion, post nasal drip, epistaxis, sore throat, earache, hearing loss, dental pain, Tinnitus, Vertigo, Sinus pain, snoring.  Cardio: Denies chest pain, palpitations, irregular heartbeat, syncope, dyspnea, diaphoresis, orthopnea, PND, claudication, edema Respiratory: denies cough, dyspnea, DOE, pleurisy, hoarseness, laryngitis, wheezing.  Gastrointestinal: Denies dysphagia, heartburn, reflux, water brash, pain, cramps, nausea, vomiting, bloating, diarrhea, constipation, hematemesis, melena, hematochezia, jaundice, hemorrhoids Genitourinary: Denies dysuria, frequency, urgency, nocturia, hesitancy, discharge, hematuria, flank pain Breast: Breast lumps, nipple discharge, bleeding.  Musculoskeletal: Denies arthralgia, myalgia, stiffness, Jt. Swelling, pain, limp, and strain/sprain. Denies falls. Skin: Denies puritis, rash, hives, warts, acne, eczema, changing in skin lesion Neuro: No  weakness, tremor, incoordination, spasms, paresthesia, pain Psychiatric: Denies confusion, memory loss, sensory loss. Denies Depression. Endocrine: Denies change in weight, skin, hair change, nocturia, and paresthesia, diabetic polys, visual blurring, hyper / hypo glycemic episodes.  Heme/Lymph: No excessive bleeding, bruising, enlarged lymph nodes.  Physical Exam  BP 142/76  Pulse 60  Temp(Src) 99.1 F (37.3 C) (Temporal)  Resp 16  Ht 5\' 2"  (1.575 m)  Wt 146 lb 6.4 oz (66.407 kg)  BMI 26.77 kg/m2  General Appearance: Well nourished and in no apparent distress. Eyes: PERRLA, EOMs, conjunctiva no swelling or erythema, normal fundi and vessels. Sinuses: No frontal/maxillary tenderness ENT/Mouth: EACs patent / TMs  nl. Nares clear without erythema, swelling, mucoid exudates. Oral hygiene is  good. No erythema, swelling, or exudate. Tongue normal, non-obstructing. Tonsils not swollen or erythematous. Hearing normal.  Neck: Supple, thyroid normal. No bruits, nodes or JVD. Respiratory: Respiratory effort normal.  BS equal and clear bilateral without rales, rhonci, wheezing or stridor. Cardio: Heart sounds are normal with regular rate and rhythm and no murmurs, rubs or gallops. Peripheral pulses are normal and equal bilaterally without edema. No aortic or femoral bruits. Chest: symmetric with normal excursions and percussion. Breasts: Bilat surgically absent with a pinkish bumpy skin area around the rt chest area.  Abdomen: Flat, soft, with bowl sounds. Nontender, no guarding, rebound, hernias, masses, or organomegaly.  Lymphatics: Non tender without lymphadenopathy.  Genitourinary:  Musculoskeletal: Full ROM all peripheral extremities, joint stability, 5/5 strength, and normal gait. Skin: Warm and dry without rashes, lesions, cyanosis, clubbing or  ecchymosis.  Neuro: Cranial nerves intact, reflexes equal bilaterally. Normal muscle tone, no cerebellar symptoms. Sensation intact.  Pysch: Awake  and oriented X 3, normal affect, Insight and Judgment appropriate.   Assessment and Plan  1. Annual Screening Examination 2. Hypertension, Labile - monitoring 3. Hyperlipidemia 4. Pre Diabetes 5. Vitamin D Deficiency 6. COPD 7. Breast Cancer, Rt  Continue prudent diet as discussed, weight control, BP monitoring, regular exercise, and medications. Discussed med's effects and SE's. Screening labs and tests as requested with regular follow-up as recommended.

## 2014-05-02 LAB — URINALYSIS, MICROSCOPIC ONLY
Bacteria, UA: NONE SEEN
Casts: NONE SEEN
Crystals: NONE SEEN
SQUAMOUS EPITHELIAL / LPF: NONE SEEN

## 2014-05-02 LAB — INSULIN, FASTING: Insulin fasting, serum: 26 u[IU]/mL (ref 3–28)

## 2014-05-02 LAB — MICROALBUMIN / CREATININE URINE RATIO
CREATININE, URINE: 39 mg/dL
Microalb Creat Ratio: 19.7 mg/g (ref 0.0–30.0)
Microalb, Ur: 0.77 mg/dL (ref 0.00–1.89)

## 2014-05-02 LAB — VITAMIN D 25 HYDROXY (VIT D DEFICIENCY, FRACTURES): Vit D, 25-Hydroxy: 101 ng/mL — ABNORMAL HIGH (ref 30–89)

## 2014-05-09 ENCOUNTER — Ambulatory Visit (HOSPITAL_COMMUNITY)
Admission: RE | Admit: 2014-05-09 | Discharge: 2014-05-09 | Disposition: A | Discharge status: Home / Self Care | Payer: Medicare Other | Source: Ambulatory Visit | Attending: Oncology | Admitting: Oncology

## 2014-05-09 ENCOUNTER — Ambulatory Visit (HOSPITAL_COMMUNITY): Admission: RE | Admit: 2014-05-09 | Payer: Medicare Other | Source: Ambulatory Visit

## 2014-05-09 DIAGNOSIS — Z780 Asymptomatic menopausal state: Secondary | ICD-10-CM | POA: Diagnosis not present

## 2014-05-09 DIAGNOSIS — C50911 Malignant neoplasm of unspecified site of right female breast: Secondary | ICD-10-CM

## 2014-05-09 DIAGNOSIS — Z13820 Encounter for screening for osteoporosis: Secondary | ICD-10-CM | POA: Insufficient documentation

## 2014-05-12 ENCOUNTER — Other Ambulatory Visit: Payer: Self-pay | Admitting: Oncology

## 2014-05-13 ENCOUNTER — Ambulatory Visit (HOSPITAL_BASED_OUTPATIENT_CLINIC_OR_DEPARTMENT_OTHER): Payer: Medicare Other | Admitting: Oncology

## 2014-05-13 ENCOUNTER — Other Ambulatory Visit (HOSPITAL_BASED_OUTPATIENT_CLINIC_OR_DEPARTMENT_OTHER): Payer: Medicare Other

## 2014-05-13 ENCOUNTER — Encounter: Payer: Self-pay | Admitting: Oncology

## 2014-05-13 ENCOUNTER — Telehealth: Payer: Self-pay | Admitting: Oncology

## 2014-05-13 VITALS — BP 114/66 | HR 64 | Temp 98.3°F | Resp 20 | Ht 62.0 in | Wt 152.0 lb

## 2014-05-13 DIAGNOSIS — M810 Age-related osteoporosis without current pathological fracture: Secondary | ICD-10-CM

## 2014-05-13 DIAGNOSIS — R5383 Other fatigue: Secondary | ICD-10-CM

## 2014-05-13 DIAGNOSIS — Z87898 Personal history of other specified conditions: Secondary | ICD-10-CM

## 2014-05-13 DIAGNOSIS — R5381 Other malaise: Secondary | ICD-10-CM

## 2014-05-13 DIAGNOSIS — C44599 Other specified malignant neoplasm of skin of other part of trunk: Secondary | ICD-10-CM

## 2014-05-13 DIAGNOSIS — C50911 Malignant neoplasm of unspecified site of right female breast: Secondary | ICD-10-CM

## 2014-05-13 DIAGNOSIS — C44509 Unspecified malignant neoplasm of skin of other part of trunk: Secondary | ICD-10-CM

## 2014-05-13 LAB — COMPREHENSIVE METABOLIC PANEL (CC13)
ALT: 9 U/L (ref 0–55)
ANION GAP: 6 meq/L (ref 3–11)
AST: 16 U/L (ref 5–34)
Albumin: 3.6 g/dL (ref 3.5–5.0)
Alkaline Phosphatase: 50 U/L (ref 40–150)
BILIRUBIN TOTAL: 0.21 mg/dL (ref 0.20–1.20)
BUN: 9.7 mg/dL (ref 7.0–26.0)
CO2: 29 meq/L (ref 22–29)
Calcium: 9.2 mg/dL (ref 8.4–10.4)
Chloride: 102 mEq/L (ref 98–109)
Creatinine: 0.8 mg/dL (ref 0.6–1.1)
GLUCOSE: 85 mg/dL (ref 70–140)
Potassium: 4 mEq/L (ref 3.5–5.1)
SODIUM: 137 meq/L (ref 136–145)
Total Protein: 6.4 g/dL (ref 6.4–8.3)

## 2014-05-13 LAB — CBC WITH DIFFERENTIAL/PLATELET
BASO%: 0.9 % (ref 0.0–2.0)
BASOS ABS: 0 10*3/uL (ref 0.0–0.1)
EOS ABS: 0.1 10*3/uL (ref 0.0–0.5)
EOS%: 1.4 % (ref 0.0–7.0)
HCT: 39.5 % (ref 34.8–46.6)
HEMOGLOBIN: 12.9 g/dL (ref 11.6–15.9)
LYMPH%: 42.1 % (ref 14.0–49.7)
MCH: 29.7 pg (ref 25.1–34.0)
MCHC: 32.5 g/dL (ref 31.5–36.0)
MCV: 91.2 fL (ref 79.5–101.0)
MONO#: 0.4 10*3/uL (ref 0.1–0.9)
MONO%: 8.5 % (ref 0.0–14.0)
NEUT#: 2.4 10*3/uL (ref 1.5–6.5)
NEUT%: 47.1 % (ref 38.4–76.8)
PLATELETS: 210 10*3/uL (ref 145–400)
RBC: 4.34 10*6/uL (ref 3.70–5.45)
RDW: 12.9 % (ref 11.2–14.5)
WBC: 5 10*3/uL (ref 3.9–10.3)
lymph#: 2.1 10*3/uL (ref 0.9–3.3)

## 2014-05-13 MED ORDER — LETROZOLE 2.5 MG PO TABS: 2.5000 mg | ORAL_TABLET | Freq: Every day | ORAL | Status: DC

## 2014-05-13 NOTE — Progress Notes (Signed)
OFFICE PROGRESS NOTE   05/13/2014   Physicians:W.McKeown, T.Gerkin, R.Ramos, P.Wright, T.Turner, R.Murray   INTERVAL HISTORY:  Patient is seen, together with 2 daughters, in continuing attention to chest wall recurrence of right breast cancer.  She completed electron beam radiation 4500 cGY to right chest wall by Dr Valere Dross on 03-27-14 and did stop Evista when I saw her last 04-10-14. She had bone density scan in [facility] system 05-09-14 with osteoporosis, T score in L spine -2.1 and in femur -2.9. At my visit on 04-10-14, she still had significant erythema in RT portal; by time of Dr Charlton Amor follow up on 04-30-14, there were multiple tiny nodules in that area. Patient denies any discomfort across the chest wall now. She tells me that she had noticed the new nodules shortly prior to Dr Charlton Amor follow up visit. She had physical exam by Dr [name] on 05-01-14, note reviewed in EMR. She is to see Dr Fransico Him on 05-23-14. She has new patient visit to Dr [name] on 06-12-14, that office convenient to her home.  She denies any new or different problems otherwise. She is still fatigued moreso than baseline, which has been thought cardiac related. Appetite is good. She denies LE swelling, increased SOB or other respiratory symptoms.     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 She had a second right breast cancer in 2012, with right mastectomy by Dr [name] 07-29-2011 for T1N0M0 ER/PR positive, HER-2 negative invasive ductal carcinoma; she also had what was planned as prophylactic left mastectomy, with unexpected finding of left DCIS in that pathology. Plan in 09-2011 was to begin tamoxifen, chosen particularly due to low bone density. Patient did not keep follow up visits at this office, stopped tamoxifen due intolerance (tho she does not remember what problem she had with this) and was treated with  raloxifene by Dr [name].  She was seen next at this office on 12-26-2013 with an enlarging nodular area above right mastectomy scar,reportedly present for several months. Excisional biopsy done 01-21-14 found grade 2 invasive ductal carcinoma with involvement at [facility]) and lymphovascular space invasion, ER positive 100%, PR positive 75%, proliferation marker 80% and HER-2 negative by [facility] (pathology [id number]). PET 02-15-14 did not identify any involvement beyond right chest wall. She had 4500 cGy as electron beam radiation from 5-27 thru 03-27-14 to right chest wall. By 04-30-14 there were multiple tiny nodules in region of right mastectomy scar.  Review of systems as above, also: No GI problems. No frank chest pain. No bleeding. No fever or symptoms of infection. No swelling RUE. Remainder of 10 point Review of Systems negative.  Objective:  Vital signs in last 24 hours:  BP 114/66  Pulse 64  Temp(Src) 98.3 F (36.8 C) (Oral)  Resp 20  Ht '5\' 2"'  (1.575 m)  Wt 152 lb (68.947 kg)  BMI 27.79 kg/m2 weight is up 6 lbs from my last visit.  Alert, oriented and appropriate. Ambulatory using cane. Not in obvious discomfort. Daughters supportive.   HEENT:PERRL, sclerae not icteric. Oral mucosa moist without lesions, posterior pharynx clear.  Neck supple. No JVD.  Lymphatics:no cervical,suraclavicular, axillary adenopathy Resp: clear to auscultation bilaterally and normal percussion bilaterally Cardio: regular rate and rhythm. No gallop. GI: soft, nontender, not distended, no mass or organomegaly. Normally active bowel sounds.  Musculoskeletal/ Extremities: without pitting edema, cords, tenderness including no increased swelling bilateral UE. Neuro: speech fluent, motor/sensory/cerebellar grossly  nonfocal  PSYCH mood and affect appropriate Skin otherwise without rash, ecchymosis, petechiae Breasts surgically absent bilaterally. Left mastectomy scar not remarkable. RIght mastectomy scar with  resolution of erythema in RT portal, but multiple new tiny nodules mostly 1-3 mm, firm, not tender, no vesicles.Nothing in either axilla.   Lab Results:  Results for orders placed in visit on 05/13/14  CBC WITH DIFFERENTIAL      Result Value Ref Range   WBC 5.0  3.9 - 10.3 10e3/uL   NEUT# 2.4  1.5 - 6.5 10e3/uL   HGB 12.9  11.6 - 15.9 g/dL   HCT 39.5  34.8 - 46.6 %   Platelets 210  145 - 400 10e3/uL   MCV 91.2  79.5 - 101.0 fL   MCH 29.7  25.1 - 34.0 pg   MCHC 32.5  31.5 - 36.0 g/dL   RBC 4.34  3.70 - 5.45 10e6/uL   RDW 12.9  11.2 - 14.5 %   lymph# 2.1  0.9 - 3.3 10e3/uL   MONO# 0.4  0.1 - 0.9 10e3/uL   Eosinophils Absolute 0.1  0.0 - 0.5 10e3/uL   Basophils Absolute 0.0  0.0 - 0.1 10e3/uL   NEUT% 47.1  38.4 - 76.8 %   LYMPH% 42.1  14.0 - 49.7 %   MONO% 8.5  0.0 - 14.0 %   EOS% 1.4  0.0 - 7.0 %   BASO% 0.9  0.0 - 2.0 %    CMET resulting after visit entirely normal   Studies/Results:  Bone density scan 05-09-14 at [facility] scanned into EMR, osteoporosis with lowest T score -2.9 femur.  Medications: I have reviewed the patient's current medications. She has been taking calcium only once daily, but agrees to increase this to bid; she is on supplemental Vitamin D. Prolia would likely be good additionally if she will agree. Prescription for Femara 2.5 mg daily #30 to [place]; if she tolerates this, she will likely do mail off for 90 days  DISCUSSION: I agree with Dr Valere Dross that chest wall findings are concerning for further progression despite recent RT. As I am recommending that she begin aromatase inhibitor promptly, and as that recommendation would not change even if we repeated biopsy of the new nodules, I do not think that biopsy is necessary at this point. Although patient has been very reluctant to consider other hormonal blockers, she is in agreement with this plan now. We have discussed mechanism of action of the AIs and possible side effects, including  decrease in bone density and arthralgias. I have encouraged her to optimize calcium and try to increase weight bearing exercise, tho obviously exercise tolerance is limited. Hopefully she will consider Prolia in near future. Daughter wanted to speak with me separately after visit, however patient was not comfortable with that.  Assessment/Plan:  1.invasive ductal carcinoma of right breast ER PR +/ HER-2 negative recurrent on right chest wall: post excisional biopsy with positive margins, post local radiation completed 03-27-14. Clinically appears to have progression locally since RT completed. As this developed on Evista, and even with low bone density, I have recommended aromatase inhibitor now. I believe hormonal blocker is preferable to chemotherapy due to other significant comorbidities, decreased PS and her poor tolerance of medications/ interventions. I will see her back in 2-3 weeks. 2.cardiac disease, with recent unstable angina and frequent PVCs, CAD stable by cath 11-2013. This problem and medications may be contributing to or causing her fatigue.  3.History of bilateral DCIS and invasive  ductal carcinoma of right breast: history as above  4.osteoporosis by bone density scan 05-09-14, despite being on Evista from sometime after 09-2011 until [facility] with recurrent breast cancer diagnosis in 03-2014.  5.chronic bronchitis and asthma  6.remote hysterectomy without oophorectomy  5.degenerative arthritis and spinal stenosis.   Patient and daughters were in agreement with plan above and know that they can call prior to scheduled return visit if questions or concerns.    LIVESAY,LENNIS P, MD   05/13/2014, 2:32 PM

## 2014-05-13 NOTE — Patient Instructions (Signed)
Increase calcium to 600 mg morning and 600 mg evening  Weight bearing exercise daily  Start Femara (letrozole) 2.5 mg daily. We will send 30 day prescription to [place].    Call if problems or concerns prior to next scheduled visit.

## 2014-05-13 NOTE — Telephone Encounter (Signed)
per pof to sch pt appts-gave daughtrer copy of sch

## 2014-05-15 ENCOUNTER — Telehealth: Payer: Self-pay | Admitting: *Deleted

## 2014-05-15 NOTE — Telephone Encounter (Signed)
Received call from pt's daughter Cathie Hoops stating that the pt is not happy with [facility] and wants to switch to [facility].  Informed her that we would need to get the ok and then I could call her back with the decision.  She is fine with that.  Emailed Bary Castilla to get the ok from both physicians and advise me.

## 2014-05-17 ENCOUNTER — Other Ambulatory Visit: Payer: Self-pay | Admitting: *Deleted

## 2014-05-17 DIAGNOSIS — Z1212 Encounter for screening for malignant neoplasm of rectum: Secondary | ICD-10-CM

## 2014-05-17 LAB — POC HEMOCCULT BLD/STL (HOME/3-CARD/SCREEN)
Card #2 Fecal Occult Blod, POC: NEGATIVE
FECAL OCCULT BLD: NEGATIVE
FECAL OCCULT BLD: NEGATIVE

## 2014-05-18 ENCOUNTER — Other Ambulatory Visit: Payer: Self-pay | Admitting: Oncology

## 2014-05-19 ENCOUNTER — Other Ambulatory Visit: Payer: Self-pay | Admitting: Oncology

## 2014-05-21 ENCOUNTER — Telehealth: Payer: Self-pay | Admitting: *Deleted

## 2014-05-21 NOTE — Telephone Encounter (Signed)
Received ok from both Drs. Livesay and Magrinat to switch pt.  Received appt date and time from Dr. Jana Hakim and called pt and confirmed 05/29/14 at 8am appt w/ her.  Called her daughter [name] and left a message to make her aware per the pts request.

## 2014-05-23 ENCOUNTER — Encounter: Payer: Self-pay | Admitting: Cardiology

## 2014-05-23 ENCOUNTER — Ambulatory Visit (INDEPENDENT_AMBULATORY_CARE_PROVIDER_SITE_OTHER): Payer: Medicare Other | Admitting: Cardiology

## 2014-05-23 VITALS — BP 118/66 | HR 61 | Ht 62.0 in | Wt 146.0 lb

## 2014-05-23 DIAGNOSIS — I493 Ventricular premature depolarization: Secondary | ICD-10-CM

## 2014-05-23 DIAGNOSIS — I4949 Other premature depolarization: Secondary | ICD-10-CM

## 2014-05-23 DIAGNOSIS — E782 Mixed hyperlipidemia: Secondary | ICD-10-CM

## 2014-05-23 DIAGNOSIS — I2510 Atherosclerotic heart disease of native coronary artery without angina pectoris: Secondary | ICD-10-CM

## 2014-05-23 MED ORDER — ASPIRIN EC 81 MG PO TBEC: 81.0000 mg | DELAYED_RELEASE_TABLET | Freq: Every day | ORAL | Status: DC

## 2014-05-23 MED ORDER — ISOSORBIDE MONONITRATE ER 30 MG PO TB24: 30.0000 mg | ORAL_TABLET | Freq: Every day | ORAL | Status: DC

## 2014-05-23 NOTE — Patient Instructions (Addendum)
Your physician has recommended you make the following change in your medication: 1. Decrease ASA to 81 MG daily 2. Start Imdur 30 MG 1 tablet daily as was prescribed at discharge from [facility] 4/15 3. Decrease metoprolol to a half tablet daily for one week then stop  Your physician recommends that you schedule a follow-up appointment in: 3 months

## 2014-05-23 NOTE — Progress Notes (Signed)
[place], [facility], [place]  [address] Phone: [phone] Fax:  [phone]  Date:  05/23/2014   ID:  Angel [name], DOB 1932-06-09, MRN [id number]  PCP:  Alesia Richards, MD  Cardiologist:  Fransico Him, MD     History of Present Illness: Angel [name] is a 80 y.o. female with a hx of non-obstructive CAD by cath in 2012, DDD, HL, breast CA, remote asthma, GERD. She was admitted 3/10-3/12 with substernal chest discomfort concerning for unstable angina. Cardiac enzymes remained normal. She had improvement with nitroglycerin. She underwent cardiac catheterization that demonstrated nonobstructive CAD with mid LAD 40-50% stenosis. This was unchanged from 2012. Continued medical therapy was recommended. Ranolazine was added to her medical regimen but she had to stop it after a few days due to severe fatigue.  Patient had significant side effects to metoprolol with fatigue and lethargy and it was stopped.  She was readmitted to the hospital 01/21/2014 with ventricular ectopy on an EKG prior to surgery for biopsy of chest wall mass and was also complaining of chest pain.  Her BB was restarted a lexiscan myoview was done which showed no ischemia.  Imdur was added but she never took it.  She subsequently underwent biopsy which revealed recurrent grade 2 invasive ductal CA.  She presents back today for routine followup.  She denies any anginal chest pain, LE edema, dizziness, palpitations or syncope.  She occasionally will have some SOB when she gets tired.  She is still having fatigue on the metoprolol and has to go to bed in the afternoon.   Wt Readings from Last 3 Encounters:  05/23/14 146 lb (66.225 kg)  05/13/14 152 lb (68.947 kg)  05/01/14 146 lb 6.4 oz (66.407 kg)     Past Medical History  Diagnosis Date  . Asthma   . Hyperlipemia   . Spinal stenosis   . Osteoporosis   . Chronic bronchitis   . Kidney stones   . DDD (degenerative disc disease)   .  Osteoarthritis   . Degenerative joint disease   . Skin cancer of face     non melanoma  . Hypertension   . Coronary artery disease Non-obstructive    a. 2012 Cath: LAD 40-25m->Med Rx;  b. 11/2013 Echo: EF 50-55%, nl wall motion;  c. 12/2013 Lexi MV: EF 74%, no ischemia.  . Depression   . Anxiety   . COPD (chronic obstructive pulmonary disease)   . Dyslipidemia   . Cholelithiasis   . GERD (gastroesophageal reflux disease)   . Hx of radiation therapy 07/02/1999- 08/11/1999    right supraclavucular/axillary region: 5040 cGy, 28 fractions  . Breast cancer 05/07/1999, 06/2011    a. s/p bilat mastectomies. recurrence 01/2014  . Hx of radiation therapy 02/20/14- 03/27/14    right chest wall 4500 cGy 25 sessions    Current Outpatient Prescriptions  Medication Sig Dispense Refill  . albuterol (PROVENTIL HFA;VENTOLIN HFA) 108 (90 BASE) MCG/ACT inhaler Inhale into the lungs every 6 (six) hours as needed for wheezing or shortness of breath.      [name] aspirin 325 MG EC tablet Take 325 mg by mouth.      . Calcium-Magnesium-Zinc 1000-400-15 MG TABS Take 1 tablet by mouth daily.        . celecoxib (CELEBREX) 200 MG capsule Take 200 mg by mouth daily.       . Cholecalciferol (VITAMIN D-3) 5000 UNITS TABS Take 1 tablet by mouth daily.       [name]  clorazepate (TRANXENE-T) 7.5 MG tablet Take 1/2 to 1 tablet 3 x daily if needed for anxiety  90 tablet  3  . DULoxetine (CYMBALTA) 60 MG capsule Take 1 capsule by mouth  every day for mood  90 capsule  0  . ezetimibe (ZETIA) 10 MG tablet Take 10 mg by mouth daily.      . Fish Oil OIL Take 100 mg by mouth 3 (three) times daily.      . Flaxseed, Linseed, (FLAX SEED OIL PO) Take 5 mLs by mouth daily. 1 tsp daily      . HYDROcodone-acetaminophen (NORCO) 10-325 MG per tablet Take 1 tablet by mouth every 4 (four) hours as needed. For pain      . letrozole (FEMARA) 2.5 MG tablet Take 1 tablet (2.5 mg total) by mouth daily.  30 tablet  0  . metoprolol tartrate (LOPRESSOR) 25 MG  tablet Take 12.5 mg by mouth 2 (two) times daily.      . mometasone (NASONEX) 50 MCG/ACT nasal spray Place 2 sprays into the nose daily.  17 g  6  . montelukast (SINGULAIR) 10 MG tablet Take 1 tablet (10 mg total) by mouth at bedtime.  90 tablet  2  . Multiple Vitamins-Minerals (MULTIVITAMINS THER. W/MINERALS) TABS Take 1 tablet by mouth daily.        . nitroGLYCERIN (NITROSTAT) 0.4 MG SL tablet Place 1 tablet (0.4 mg total) under the tongue every 5 (five) minutes x 3 doses as needed for chest pain.  25 tablet  3  . raloxifene (EVISTA) 60 MG tablet Take 60 mg by mouth daily.      . SYMBICORT 160-4.5 MCG/ACT inhaler Inhale 1 puff into the lungs daily.       No current facility-administered medications for this visit.    Allergies:    Allergies  Allergen Reactions  . Adhesive [Tape]   . Penicillins Rash    Social History:  The patient  reports that she has never smoked. She has never used smokeless tobacco. She reports that she drinks alcohol. She reports that she does not use illicit drugs.   Family History:  The patient's family history includes Asthma in her brother, brother, daughter, and grandchild; Cancer in her mother; Emphysema in her mother; Heart disease in her brother, father, and mother; Lymphoma in her brother; Pancreatic cancer in her mother.   ROS:  Please see the history of present illness.      All other systems reviewed and negative.   PHYSICAL EXAM: VS:  BP 118/66  Pulse 61  Ht 5\' 2"  (1.575 m)  Wt 146 lb (66.225 kg)  BMI 26.70 kg/m2 Well nourished, well developed, in no acute distress HEENT: normal Neck: no JVD Cardiac:  normal S1, S2; RRR; no murmur Lungs:  clear to auscultation bilaterally, no wheezing, rhonchi or rales Abd: soft, nontender, no hepatomegaly Ext: no edema Skin: warm and dry Neuro:  CNs 2-12 intact, no focal abnormalities noted      ASSESSMENT AND PLAN:  1. Chest Pain: She was placed on Ranexa for possible microvascular ischemia but stopped  it due to severe fatigue.. She had a Lexiscan myoview with no ischemia.  She has not had recurrent anginal symptoms. QTc is ok. She is still on the metoprolol but is having a lot of fatigue to the point she has to go to bed in the afternoon.  I have told her to wean off of the BB and go on the Imdur 30mg  daily that  she was supposed to be taking. 2. Nonobstructive CAD: Continue aspirin. She is intolerant of statin and ranexa.  I have asked her to decrease ASA to 81mg  daily.  I have told her to start the Imdur that was prescribed at last discharge on 4/27 but she did not start it.  I have also told her she can stop her metoprolol given her fatigue.  I instructed her to wean off the BB by taking 1/2 tablet daily for 1 week then stop. 3. Hyperlipidemia: Continue Zetia. Statin intolerant.  4. PVCs: She is asymptomatic. She has normal LV function. At this point, I recommend she continue on her current regimen.   Followup with me in 3 months  Signed, Fransico Him, MD 05/23/2014 4:39 PM

## 2014-05-24 ENCOUNTER — Ambulatory Visit: Payer: Medicare Other | Admitting: Cardiology

## 2014-05-28 NOTE — Progress Notes (Signed)
Angel [name]  [phone] [phone]     ID: Angel [name] DOB: Oct 21, 1931  MR#: [id number]  NIO#:[id number]  Patient Care Team: Unk Pinto, MD as PCP - General (Internal Medicine) [name], MD as Consulting Physician (Oncology) Rexene Edison, MD as Consulting Physician (Radiation Oncology) [name], MD as Consulting Physician (Cardiology)   CHIEF COMPLAINT: Chest wall recurrence of breast cancer  CURRENT TREATMENT: Letrozole   BREAST CANCER HISTORY: Per Dr. 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 She had a second right breast cancer in 2012, with right mastectomy by Dr [name] 07-29-2011 for T1N0M0 ER/PR positive, HER-2 negative invasive ductal carcinoma; she also had what was planned as prophylactic left mastectomy, with unexpected finding of left DCIS in that pathology. Plan in 09-2011 was to begin tamoxifen, chosen particularly due to low bone density. Patient did not keep follow up visits at this office, stopped tamoxifen due intolerance (tho she does not remember what problem she had with this) and was treated with raloxifene by Dr [name].  She was seen next at this office on 12-26-2013 with an enlarging nodular area above right mastectomy scar,reportedly present for several months. Excisional biopsy done 01-21-14 found grade 2 invasive ductal carcinoma with involvement at [facility]) and lymphovascular space invasion, ER positive 100%, PR positive 75%, proliferation marker 80% and HER-2 negative by [facility] (pathology [phone]). PET 02-15-14 did not identify any involvement beyond right chest wall. She had 4500 cGy as electron beam radiation from 5-27 thru 03-27-14 to right chest wall. By 04-30-14 there were multiple tiny nodules in region of right mastectomy scar."  The patient's subsequent history is as detailed  below  INTERVAL HISTORY: Angel [name] was evaluated in the breast clinic 05/29/2014 accompanied by her daughters Angel [name] and Angel [name].  REVIEW OF SYSTEMS: Most days she feels pretty fatigued. On a very good day she will go to a couple of church activities or do a little bit of shopping. She is then pretty much "tuckered out" for the rest of the day. She has unstable angina and is followed by Dr. Radford Pax for this. She is back and joint pain, with significant degenerative disease, which is stable. She has occasional headaches. Her right arm sometimes feels weak. He is anxious and depressed. She has hot flashes. A detailed review of systems was otherwise noncontributory  PAST MEDICAL HISTORY: Past Medical History  Diagnosis Date  . Asthma   . Hyperlipemia   . Spinal stenosis   . Osteoporosis   . Chronic bronchitis   . Kidney stones   . DDD (degenerative disc disease)   . Osteoarthritis   . Degenerative joint disease   . Skin cancer of face     non melanoma  . Hypertension   . Coronary artery disease Non-obstructive    a. 2012 Cath: LAD 40-63m>Med Rx;  b. 11/2013 Echo: EF 50-55%, nl wall motion;  c. 12/2013 Lexi MV: EF 74%, no ischemia.  . Depression   . Anxiety   . COPD (chronic obstructive pulmonary disease)   . Dyslipidemia   . Cholelithiasis   . GERD (gastroesophageal reflux disease)   . Hx of radiation therapy 07/02/1999- 08/11/1999    right supraclavucular/axillary region: 5040 cGy, 28 fractions  . Breast cancer 05/07/1999, 06/2011    a. s/p bilat mastectomies. recurrence 01/2014  . Hx of radiation therapy 02/20/14- 03/27/14    right chest wall  4500 cGy 25 sessions    PAST SURGICAL HISTORY: Past Surgical History  Procedure Laterality Date  . Vesicovaginal fistula closure w/ tah  age 53  . Tubal ligation  1961  . Appendectomy    . Abdominal hysterectomy      in her 30's  . Small intestine surgery  1998    SBO  . Tonsillectomy    . Breast lumpectomy  2002    right breast  .  Mastectomy  07/29/11    bilateral-rt nodes-none on left  . Cardiac catheterization  03/2011, 3/15  . Breast biopsy Right 01/21/2014    Procedure: BREAST/CHEST WALL BIOPSY;  Surgeon: [name], MD;  Location: [facility];  Service: General;  Laterality: Right;  . Vein ligation and stripping Right     FAMILY HISTORY Family History  Problem Relation Age of Onset  . Emphysema Mother   . Heart disease Mother   . Pancreatic cancer Mother   . Cancer Mother     pancreatic  . Asthma Brother   . Asthma Daughter   . Asthma Grandchild   . Asthma Brother     deceased  . Heart disease Father   . Heart disease Brother   . Lymphoma Brother    the patient's father died at the age of 71 from heart disease. He was a smoker. The patient's mother died at the age of 33 with cancer of the pancreas. The patient has 3 brothers, no sisters. There is no history of breast or ovarian cancer in the family to her knowledge.  GYNECOLOGIC HISTORY:  No LMP recorded. Patient has had a hysterectomy. Menarche age 32, first live birth age 56. She is GX P4. She stopped having periods in her 73s. She took hormone replacement for approximately 10 years. 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 She has been "single" for 40 years. Her oldest daughter died from a subarachnoid hemorrhage at age 29, 2 years ago. This is when the patient had undergone treatment for invasive breast cancer and she tells me she was simply overwhelmed and could not start the planned antiestrogen therapy. The next daughter, Angel [name], is a retired Software engineer. She lives in [place]. Next child, Angel [name], works in Charity fundraiser. She also lives in [place]. The youngest, Angel [name], is retired from the Office manager position. He also owned his own business. The patient has 7 grandchildren and 2 many great-grandchildren to count. She attends the local [employer]    ADVANCED DIRECTIVES: In place; the patient's daughter Angel [name] is her healthcare power of attorney. She can be reached at [place]: History  Substance Use Topics  . Smoking status: Never Smoker   . Smokeless tobacco: Never Used  . Alcohol Use: Yes     Comment: occ wine     Colonoscopy:  PAP:  Bone density:  Lipid panel:  Allergies  Allergen Reactions  . Adhesive [Tape]   . Penicillins Rash    Current Outpatient Prescriptions  Medication Sig Dispense Refill  . albuterol (PROVENTIL HFA;VENTOLIN HFA) 108 (90 BASE) MCG/ACT inhaler Inhale into the lungs every 6 (six) hours as needed for wheezing or shortness of breath.      [name] aspirin EC 81 MG tablet Take 1 tablet (81 mg total) by mouth daily.      . Calcium-Magnesium-Zinc 1000-400-15 MG TABS Take 1 tablet by mouth daily.        [name]  celecoxib (CELEBREX) 200 MG capsule Take 200 mg by mouth daily.       . Cholecalciferol (VITAMIN D-3) 5000 UNITS TABS Take 1 tablet by mouth daily.       . clorazepate (TRANXENE-T) 7.5 MG tablet Take 1/2 to 1 tablet 3 x daily if needed for anxiety  90 tablet  3  . DULoxetine (CYMBALTA) 60 MG capsule Take 1 capsule by mouth  every day for mood  90 capsule  0  . ezetimibe (ZETIA) 10 MG tablet Take 10 mg by mouth daily.      . Fish Oil OIL Take 100 mg by mouth 3 (three) times daily.      . Flaxseed, Linseed, (FLAX SEED OIL PO) Take 5 mLs by mouth daily. 1 tsp daily      . HYDROcodone-acetaminophen (NORCO) 10-325 MG per tablet Take 1 tablet by mouth every 4 (four) hours as needed. For pain      . isosorbide mononitrate (IMDUR) 30 MG 24 hr tablet Take 1 tablet (30 mg total) by mouth daily.      [name] letrozole (FEMARA) 2.5 MG tablet Take 1 tablet (2.5 mg total) by mouth daily.  30 tablet  0  . mometasone (NASONEX) 50 MCG/ACT nasal spray Place 2 sprays into the nose daily.  17 g  6  . montelukast (SINGULAIR) 10 MG tablet Take 1 tablet (10 mg total) by mouth at bedtime.  90  tablet  2  . Multiple Vitamins-Minerals (MULTIVITAMINS THER. W/MINERALS) TABS Take 1 tablet by mouth daily.        . nitroGLYCERIN (NITROSTAT) 0.4 MG SL tablet Place 1 tablet (0.4 mg total) under the tongue every 5 (five) minutes x 3 doses as needed for chest pain.  25 tablet  3  . raloxifene (EVISTA) 60 MG tablet Take 60 mg by mouth daily.      . SYMBICORT 160-4.5 MCG/ACT inhaler Inhale 1 puff into the lungs daily.       No current facility-administered medications for this visit.    OBJECTIVE: Older white woman who appears stated age 74 Vitals:   05/29/14 0757  BP: 126/38  Pulse: 70  Temp: 97.9 F (36.6 C)  Resp: 18     Body mass index is 26.93 kg/(m^2).    ECOG FS:2 - Symptomatic, <50% confined to bed  Ocular: Sclerae unicteric, pupils round and equal Ear-nose-throat: Oropharynx clear and slightly dry Lymphatic: No cervical or supraclavicular adenopathy Lungs no rales or rhonchi Heart regular rate and rhythm Abd soft, nontender, positive bowel sounds MSK no focal spinal tenderness, no joint edema Neuro: non-focal, well-oriented, appropriate affect Breasts: The right breast is status post mastectomy. It is also status post chest wall radiation. There is an area of approximately 5 cm which includes multiple rice-sized nodules. These are minimally erythematous. They are not tender. The fall largely within the radiation port. I do not palpate any right axillary adenopathy. The left breast is status post mastectomy. There is no evidence of local recurrence. The left axilla is benign.   LAB RESULTS:  CMP     Component Value Date/Time   NA 137 05/13/2014 1406   NA 138 05/01/2014 1050   K 4.0 05/13/2014 1406   K 5.0 05/01/2014 1050   CL 102 05/01/2014 1050   CO2 29 05/13/2014 1406   CO2 27 05/01/2014 1050   GLUCOSE 85 05/13/2014 1406   GLUCOSE 104* 05/01/2014 1050   BUN 9.7 05/13/2014 1406   BUN 14 05/01/2014 1050  CREATININE 0.8 05/13/2014 1406   CREATININE 0.78 05/01/2014 1050    CREATININE 0.70 01/20/2014 0415   CALCIUM 9.2 05/13/2014 1406   CALCIUM 9.4 05/01/2014 1050   PROT 6.4 05/13/2014 1406   PROT 6.1 05/01/2014 1050   ALBUMIN 3.6 05/13/2014 1406   ALBUMIN 4.0 05/01/2014 1050   AST 16 05/13/2014 1406   AST 16 05/01/2014 1050   ALT 9 05/13/2014 1406   ALT 11 05/01/2014 1050   ALKPHOS 50 05/13/2014 1406   ALKPHOS 45 05/01/2014 1050   BILITOT 0.21 05/13/2014 1406   BILITOT 0.5 05/01/2014 1050   GFRNONAA 73 05/01/2014 1050   GFRNONAA 80* 01/20/2014 0415   GFRAA 84 05/01/2014 1050   GFRAA >90 01/20/2014 0415    I No results found for this basename: SPEP,  UPEP,   kappa and lambda light chains    Lab Results  Component Value Date   WBC 5.0 05/13/2014   NEUTROABS 2.4 05/13/2014   HGB 12.9 05/13/2014   HCT 39.5 05/13/2014   MCV 91.2 05/13/2014   PLT 210 05/13/2014      Chemistry      Component Value Date/Time   NA 137 05/13/2014 1406   NA 138 05/01/2014 1050   K 4.0 05/13/2014 1406   K 5.0 05/01/2014 1050   CL 102 05/01/2014 1050   CO2 29 05/13/2014 1406   CO2 27 05/01/2014 1050   BUN 9.7 05/13/2014 1406   BUN 14 05/01/2014 1050   CREATININE 0.8 05/13/2014 1406   CREATININE 0.78 05/01/2014 1050   CREATININE 0.70 01/20/2014 0415      Component Value Date/Time   CALCIUM 9.2 05/13/2014 1406   CALCIUM 9.4 05/01/2014 1050   ALKPHOS 50 05/13/2014 1406   ALKPHOS 45 05/01/2014 1050   AST 16 05/13/2014 1406   AST 16 05/01/2014 1050   ALT 9 05/13/2014 1406   ALT 11 05/01/2014 1050   BILITOT 0.21 05/13/2014 1406   BILITOT 0.5 05/01/2014 1050       Lab Results  Component Value Date   LABCA2 20 01/31/2014    No components found with this basename: [id number]    No results found for this basename: INR,  in the last 168 hours  Urinalysis    Component Value Date/Time   COLORURINE YELLOW 01/19/2014 1100   APPEARANCEUR CLEAR 01/19/2014 1100   LABSPEC 1.012 01/19/2014 1100   PHURINE 6.5 01/19/2014 1100   GLUCOSEU NEGATIVE 01/19/2014 1100   HGBUR NEGATIVE 01/19/2014 1100   BILIRUBINUR NEGATIVE 01/19/2014  1100   KETONESUR NEGATIVE 01/19/2014 1100   PROTEINUR NEGATIVE 01/19/2014 1100   UROBILINOGEN 0.2 01/19/2014 1100   NITRITE NEGATIVE 01/19/2014 1100   LEUKOCYTESUR TRACE* 01/19/2014 1100    STUDIES: Dg Bone Density  05/09/2014   EXAM: DG DXA BONE DENSITY STUDY  The Bone Mineral Densitometry hard-copy report (which includes all data, graphical display, and FRAX results when applicable) has been sent directly to the ordering physician.  This report can also be obtained electronically by viewing images for this exam through the performing facility's EMR, or by logging directly into [employer].   Electronically Signed   By: Earle Gell M.D.   On: 05/09/2014 15:47    ASSESSMENT: 80 y.o. [place] woman with locally recurrent breast cancer, as follows:  (1) status post right lumpectomy and sentinel node sampling September 2000 for ductal carcinoma in situ (Ni+), estrogen and progesterone receptor positive, with a low proliferation index,  (a) received 5040 cGy to the right breast and  regional lymph nodes  (b) on tamoxifen less than 3 months  (2) s/p bilateral mastectomies and right axillary lymph node sampling 07/29/2011, showing  (a) in the left breast, ductal carcinoma in situ measuring 1.2 cm, grade 1, with negative margins, estrogen and progesterone receptor positive  (b)  on the right, a pT1c pN0, stage IA invasive ductal carcinoma, estrogen receptor and 93% positive, progesterone receptor 87% positive, with an MIB-1 of 35%, and no HER-2 amplification margins were close but negative  (c) did not receive adjuvant antiestrogen therapy  (3) right chest wall skin recurrence resected 01/21/2014, measuring 1.7 cm, with positive margins, estrogen receptor 100% positive, progesterone receptor 75% positive, with an MIB-1 of 80%, and no HER-2 amplification; PET scan 02/15/2014 negative except for Right chest wall focus  (a) received 45 Gy electron-beam therapy to the right chest wall completed  03/27/2014  (b) skin progression within and without the radiation field noted 04/30/2014  (c) letrozole started 05/13/2014  (4) dexa scan 05/09/2014 at [facility] shows osteoporosis with a T score of -2.9  PLAN: We spent the better part of today's hour-long appointment discussing the biology of breast cancer in general, and the specifics of the patient's tumor in particular. We went over the fact that stage IV breast cancer is not curable. Iyah was thinking we would do a lot of chemotherapy, but in fact there would be no survival advantage to doing that. The most effective treatment for a cancer like hers is anti-estrogens. She has just started letrozole and seems to be tolerating it well so far. Of course if symptoms may take some time to develop. Today we discussed the possible toxicities, side effects and complications of that drug.  I would like to add palbociclib to her regimen. We discussed the fact that this drug has been found in 2 separate studies to double time to disease progression. It is very expensive, and we will try to obtain that for her at a reasonable Price. We then discussed the possible toxicities, side effects and complications of this drug including particularly concerns regarding blood counts. As soon as the patient starts that Palbociclib we will start checking her blood counts on a weekly basis. She understands there are several dose levels and many patients require a lower dose if their counts drop below an acceptable level.  Once she is stable he on letrozole and Palbociclib, I would like to start zolendronate, every 3 months. She understands this will be very helpful for osteoporosis, but also has been found to have some effect in breast cancer itself, in terms of reducing the risk of disease recurrence.  Allex has a good understanding of the overall plan. She agrees with it. She knows the goal of treatment in her case is control. She will call with any problems  that may develop before her next visit here, which will be in approximately one month assuming no complications before that.  [name], MD   05/29/2014 12:17 PM

## 2014-05-29 ENCOUNTER — Telehealth: Payer: Self-pay | Admitting: Oncology

## 2014-05-29 ENCOUNTER — Ambulatory Visit (HOSPITAL_BASED_OUTPATIENT_CLINIC_OR_DEPARTMENT_OTHER): Payer: Medicare Other | Admitting: Oncology

## 2014-05-29 ENCOUNTER — Encounter: Payer: Self-pay | Admitting: Oncology

## 2014-05-29 ENCOUNTER — Other Ambulatory Visit: Payer: Self-pay | Admitting: Oncology

## 2014-05-29 ENCOUNTER — Ambulatory Visit: Payer: Medicare Other | Admitting: Oncology

## 2014-05-29 ENCOUNTER — Other Ambulatory Visit: Payer: Medicare Other

## 2014-05-29 VITALS — BP 126/38 | HR 70 | Temp 97.9°F | Resp 18 | Ht 62.0 in | Wt 147.3 lb

## 2014-05-29 DIAGNOSIS — C50911 Malignant neoplasm of unspecified site of right female breast: Secondary | ICD-10-CM

## 2014-05-29 DIAGNOSIS — Z79811 Long term (current) use of aromatase inhibitors: Secondary | ICD-10-CM

## 2014-05-29 DIAGNOSIS — C44509 Unspecified malignant neoplasm of skin of other part of trunk: Secondary | ICD-10-CM

## 2014-05-29 DIAGNOSIS — C44599 Other specified malignant neoplasm of skin of other part of trunk: Secondary | ICD-10-CM

## 2014-05-29 DIAGNOSIS — M810 Age-related osteoporosis without current pathological fracture: Secondary | ICD-10-CM

## 2014-05-29 DIAGNOSIS — I25119 Atherosclerotic heart disease of native coronary artery with unspecified angina pectoris: Secondary | ICD-10-CM

## 2014-05-29 DIAGNOSIS — Z170 Estrogen receptor positive status [ER+]: Secondary | ICD-10-CM

## 2014-05-29 DIAGNOSIS — Z9010 Acquired absence of unspecified breast and nipple: Secondary | ICD-10-CM

## 2014-05-29 MED ORDER — PALBOCICLIB 125 MG PO CAPS: 125.0000 mg | ORAL_CAPSULE | Freq: Every day | ORAL | Status: DC

## 2014-05-29 NOTE — Progress Notes (Signed)
[employer] [facility], [id number], approved ibrance from 05/29/14-05/30/15 [id number]

## 2014-05-29 NOTE — Telephone Encounter (Signed)
, °

## 2014-06-06 ENCOUNTER — Telehealth: Payer: Self-pay | Admitting: *Deleted

## 2014-06-06 ENCOUNTER — Other Ambulatory Visit: Payer: Self-pay | Admitting: *Deleted

## 2014-06-06 DIAGNOSIS — C50911 Malignant neoplasm of unspecified site of right female breast: Secondary | ICD-10-CM

## 2014-06-06 MED ORDER — LETROZOLE 2.5 MG PO TABS: 2.5000 mg | ORAL_TABLET | Freq: Every day | ORAL | Status: DC

## 2014-06-06 NOTE — Telephone Encounter (Signed)
This RN spoke with pt per her " stop by " post picking up [facility] at [place].  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  This RN obtained phone number for Dr Silvestre Gunner of [phone] and per call with discussion of above- this RN's name and return call number given as well as MD contact number ( cell ) given for request to be given to Dr Silvestre Gunner due to no availability at this time as well as he will be out of the country shortly >

## 2014-06-12 ENCOUNTER — Ambulatory Visit: Payer: Medicare Other | Admitting: Family Medicine

## 2014-06-24 ENCOUNTER — Other Ambulatory Visit: Payer: Self-pay | Admitting: *Deleted

## 2014-06-24 DIAGNOSIS — C50911 Malignant neoplasm of unspecified site of right female breast: Secondary | ICD-10-CM

## 2014-06-25 ENCOUNTER — Other Ambulatory Visit: Payer: Self-pay | Admitting: *Deleted

## 2014-06-25 ENCOUNTER — Other Ambulatory Visit: Payer: Self-pay | Admitting: Oncology

## 2014-06-25 ENCOUNTER — Ambulatory Visit (HOSPITAL_BASED_OUTPATIENT_CLINIC_OR_DEPARTMENT_OTHER): Payer: Medicare Other | Admitting: Oncology

## 2014-06-25 ENCOUNTER — Telehealth: Payer: Self-pay | Admitting: Oncology

## 2014-06-25 ENCOUNTER — Other Ambulatory Visit (HOSPITAL_BASED_OUTPATIENT_CLINIC_OR_DEPARTMENT_OTHER): Payer: Medicare Other

## 2014-06-25 VITALS — BP 124/62 | HR 82 | Temp 97.9°F | Resp 18 | Ht 62.0 in | Wt 142.7 lb

## 2014-06-25 DIAGNOSIS — M810 Age-related osteoporosis without current pathological fracture: Secondary | ICD-10-CM

## 2014-06-25 DIAGNOSIS — F411 Generalized anxiety disorder: Secondary | ICD-10-CM

## 2014-06-25 DIAGNOSIS — M549 Dorsalgia, unspecified: Secondary | ICD-10-CM

## 2014-06-25 DIAGNOSIS — C44509 Unspecified malignant neoplasm of skin of other part of trunk: Secondary | ICD-10-CM

## 2014-06-25 DIAGNOSIS — D0590 Unspecified type of carcinoma in situ of unspecified breast: Secondary | ICD-10-CM

## 2014-06-25 DIAGNOSIS — Z87898 Personal history of other specified conditions: Secondary | ICD-10-CM

## 2014-06-25 DIAGNOSIS — C50911 Malignant neoplasm of unspecified site of right female breast: Secondary | ICD-10-CM

## 2014-06-25 DIAGNOSIS — C44599 Other specified malignant neoplasm of skin of other part of trunk: Secondary | ICD-10-CM

## 2014-06-25 LAB — CBC WITH DIFFERENTIAL/PLATELET
BASO%: 0.6 % (ref 0.0–2.0)
Basophils Absolute: 0 10*3/uL (ref 0.0–0.1)
EOS%: 0.9 % (ref 0.0–7.0)
Eosinophils Absolute: 0.1 10*3/uL (ref 0.0–0.5)
HEMATOCRIT: 41.7 % (ref 34.8–46.6)
HGB: 13.6 g/dL (ref 11.6–15.9)
LYMPH%: 34.6 % (ref 14.0–49.7)
MCH: 29.8 pg (ref 25.1–34.0)
MCHC: 32.7 g/dL (ref 31.5–36.0)
MCV: 91.2 fL (ref 79.5–101.0)
MONO#: 0.5 10*3/uL (ref 0.1–0.9)
MONO%: 7.3 % (ref 0.0–14.0)
NEUT#: 3.5 10*3/uL (ref 1.5–6.5)
NEUT%: 56.6 % (ref 38.4–76.8)
PLATELETS: 227 10*3/uL (ref 145–400)
RBC: 4.57 10*6/uL (ref 3.70–5.45)
RDW: 12.7 % (ref 11.2–14.5)
WBC: 6.2 10*3/uL (ref 3.9–10.3)
lymph#: 2.2 10*3/uL (ref 0.9–3.3)

## 2014-06-25 LAB — COMPREHENSIVE METABOLIC PANEL (CC13)
ALT: 13 U/L (ref 0–55)
ANION GAP: 6 meq/L (ref 3–11)
AST: 19 U/L (ref 5–34)
Albumin: 3.6 g/dL (ref 3.5–5.0)
Alkaline Phosphatase: 54 U/L (ref 40–150)
BUN: 10.7 mg/dL (ref 7.0–26.0)
CO2: 28 meq/L (ref 22–29)
CREATININE: 0.8 mg/dL (ref 0.6–1.1)
Calcium: 9.4 mg/dL (ref 8.4–10.4)
Chloride: 105 mEq/L (ref 98–109)
Glucose: 95 mg/dl (ref 70–140)
Potassium: 4 mEq/L (ref 3.5–5.1)
Sodium: 138 mEq/L (ref 136–145)
Total Bilirubin: 0.54 mg/dL (ref 0.20–1.20)
Total Protein: 6.5 g/dL (ref 6.4–8.3)

## 2014-06-25 MED ORDER — DIAZEPAM 5 MG PO TABS: 5.0000 mg | ORAL_TABLET | Freq: Four times a day (QID) | ORAL | Status: DC | PRN

## 2014-06-25 NOTE — Telephone Encounter (Signed)
, °

## 2014-06-25 NOTE — Progress Notes (Signed)
[name]  [phone] [phone]     ID: Angel [name] DOB: 09-Aug-1932  MR#: [id number]  TDD#:[id number]  Patient Care Team: Unk Pinto, MD as PCP - General (Internal Medicine) [name], MD as Consulting Physician (Oncology) Rexene Edison, MD as Consulting Physician (Radiation Oncology) [name], MD as Consulting Physician (Cardiology) OTHER M.D.: Maisie Fus Kurup (ophth)  CHIEF COMPLAINT: Chest wall recurrence of breast cancer  CURRENT TREATMENT: Letrozole   BREAST CANCER HISTORY: Per Dr. 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 She had a second right breast cancer in 2012, with right mastectomy by Dr [name] 07-29-2011 for T1N0M0 ER/PR positive, HER-2 negative invasive ductal carcinoma; she also had what was planned as prophylactic left mastectomy, with unexpected finding of left DCIS in that pathology. Plan in 09-2011 was to begin tamoxifen, chosen particularly due to low bone density. Patient did not keep follow up visits at this office, stopped tamoxifen due intolerance (tho she does not remember what problem she had with this) and was treated with raloxifene by Dr [name].  She was seen next at this office on 12-26-2013 with an enlarging nodular area above right mastectomy scar,reportedly present for several months. Excisional biopsy done 01-21-14 found grade 2 invasive ductal carcinoma with involvement at [facility]) and lymphovascular space invasion, ER positive 100%, PR positive 75%, proliferation marker 80% and HER-2 negative by [facility] (pathology [phone]). PET 02-15-14 did not identify any involvement beyond right chest wall. She had 4500 cGy as electron beam radiation from 5-27 thru 03-27-14 to right chest wall. By 04-30-14 there were multiple tiny nodules in region of right mastectomy scar."  The  patient's subsequent history is as detailed below  INTERVAL HISTORY: Tylisha returns today for followup of her recurrent breast cancer accompanied by her daughter Angel [name]. Since her last visit here Angel [name] started letrozole. She is tolerating that well, with no significant problems with hot flashes or vaginal dryness. She has not developed the arthralgias myalgias that any patient's can experience. She is also obtained of the Palbociclib. Both medications were essentially free. However she has not yet started the Palbociclib and she is here partly to discuss that.  REVIEW OF SYSTEMS: Since her last visit here she has had further problems with her retinal tears and vision in her right eye has diminished. She is still hoping something may be done about that. She continues to have back pain secondary to osteoporosis. She has lost the lady who used to help her a clean her house and wonders if we can help arrange for a caps program for her. She is very anxious and finds a transanal, which he takes at bedtime really does not help. She has been on Valium before, she tells me, with no complications, and she finds that that is what relaxes her the most and allows her to function more normally. Otherwise a detailed review of systems today was stable  PAST MEDICAL HISTORY: Past Medical History  Diagnosis Date  . Asthma   . Hyperlipemia   . Spinal stenosis   . Osteoporosis   . Chronic bronchitis   . Kidney stones   . DDD (degenerative disc disease)   . Osteoarthritis   . Degenerative joint disease   . Skin cancer of face     non melanoma  . Hypertension   . Coronary artery disease Non-obstructive    a. 2012  Cath: LAD 40-81m>Med Rx;  b. 11/2013 Echo: EF 50-55%, nl wall motion;  c. 12/2013 Lexi MV: EF 74%, no ischemia.  . Depression   . Anxiety   . COPD (chronic obstructive pulmonary disease)   . Dyslipidemia   . Cholelithiasis   . GERD (gastroesophageal reflux disease)   . Hx of radiation therapy  07/02/1999- 08/11/1999    right supraclavucular/axillary region: 5040 cGy, 28 fractions  . Breast cancer 05/07/1999, 06/2011    a. s/p bilat mastectomies. recurrence 01/2014  . Hx of radiation therapy 02/20/14- 03/27/14    right chest wall 4500 cGy 25 sessions    PAST SURGICAL HISTORY: Past Surgical History  Procedure Laterality Date  . Vesicovaginal fistula closure w/ tah  age 89 . Tubal ligation  1961  . Appendectomy    . Abdominal hysterectomy      in her 30's  . Small intestine surgery  1998    [facility]  . Tonsillectomy    . Breast lumpectomy  2002    right breast  . Mastectomy  07/29/11    bilateral-rt nodes-none on left  . Cardiac catheterization  03/2011, 3/15  . Breast biopsy Right 01/21/2014    Procedure: BREAST/CHEST WALL BIOPSY;  Surgeon: ARalene Ok MD;  [facility]  Service: General;  Laterality: Right;  . Vein ligation and stripping Right     FAMILY HISTORY Family History  Problem Relation Age of Onset  . Emphysema Mother   . Heart disease Mother   . Pancreatic cancer Mother   . Cancer Mother     pancreatic  . Asthma Brother   . Asthma Daughter   . Asthma Grandchild   . Asthma Brother     deceased  . Heart disease Father   . Heart disease Brother   . Lymphoma Brother    the patient's father died at the age of 650from heart disease. He was a smoker. The patient's mother died at the age of 821with cancer of the pancreas. The patient has 3 brothers, no sisters. There is no history of breast or ovarian cancer in the family to her knowledge.  GYNECOLOGIC HISTORY:  No LMP recorded. Patient has had a hysterectomy. Menarche age 80 first live birth age 80 She is GX P4. She stopped having periods in her 367s She took hormone replacement for approximately 10 years. 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 She has been "single" for 40 years. Her  oldest daughter died from a subarachnoid hemorrhage at age 78 2 years ago. This is when the patient had undergone treatment for invasive breast cancer and she tells me she was simply overwhelmed and could not start the planned antiestrogen therapy. The next daughter, WMariann [name] is a retired pSoftware engineer [place] Next child, [name] works in tCharity fundraiser [place] The youngest, FPilar Plate is retired from the mOffice managerposition. He also owned his own business. The patient has 7 grandchildren and 2 many great-grandchildren to count. She attends the [employer]    ADVANCED DIRECTIVES: In place; the patient's daughter SFreda Munrois her healthcare power of attorney. She can be reached at [place] History  Substance Use Topics  . Smoking status: Never Smoker   . Smokeless tobacco: Never Used  . Alcohol Use: Yes     Comment: occ wine     Colonoscopy:  PAP:  Bone density:  Lipid panel:  Allergies  Allergen Reactions  . Adhesive [Tape]   . Penicillins Rash    Current Outpatient Prescriptions  Medication Sig Dispense Refill  . albuterol (PROVENTIL HFA;VENTOLIN HFA) 108 (90 BASE) MCG/ACT inhaler Inhale into the lungs every 6 (six) hours as needed for wheezing or shortness of breath.      [name] aspirin EC 81 MG tablet Take 1 tablet (81 mg total) by mouth daily.      . Calcium-Magnesium-Zinc 1000-400-15 MG TABS Take 1 tablet by mouth daily.        . celecoxib (CELEBREX) 200 MG capsule Take 200 mg by mouth daily.       . Cholecalciferol (VITAMIN D-3) 5000 UNITS TABS Take 1 tablet by mouth daily.       . clorazepate (TRANXENE-T) 7.5 MG tablet Take 1/2 to 1 tablet 3 x daily if needed for anxiety  90 tablet  3  . diazepam (VALIUM) 5 MG tablet Take 1 tablet (5 mg total) by mouth every 6 (six) hours as needed for anxiety.  30 tablet  0  . DULoxetine (CYMBALTA) 60 MG capsule Take 1 capsule by mouth  every day for mood  90 capsule  0  .  ezetimibe (ZETIA) 10 MG tablet Take 10 mg by mouth daily.      . Fish Oil OIL Take 100 mg by mouth 3 (three) times daily.      . Flaxseed, Linseed, (FLAX SEED OIL PO) Take 5 mLs by mouth daily. 1 tsp daily      . HYDROcodone-acetaminophen (NORCO) 10-325 MG per tablet Take 1 tablet by mouth every 4 (four) hours as needed. For pain      . isosorbide mononitrate (IMDUR) 30 MG 24 hr tablet Take 1 tablet (30 mg total) by mouth daily.      [name] letrozole (FEMARA) 2.5 MG tablet Take 1 tablet (2.5 mg total) by mouth daily.  90 tablet  0  . mometasone (NASONEX) 50 MCG/ACT nasal spray Place 2 sprays into the nose daily.  17 g  6  . montelukast (SINGULAIR) 10 MG tablet Take 1 tablet (10 mg total) by mouth at bedtime.  90 tablet  2  . Multiple Vitamins-Minerals (MULTIVITAMINS THER. W/MINERALS) TABS Take 1 tablet by mouth daily.        . nitroGLYCERIN (NITROSTAT) 0.4 MG SL tablet Place 1 tablet (0.4 mg total) under the tongue every 5 (five) minutes x 3 doses as needed for chest pain.  25 tablet  3  . palbociclib (IBRANCE) 125 MG capsule Take 1 capsule (125 mg total) by mouth daily with breakfast. Take whole with food.  21 capsule  0  . raloxifene (EVISTA) 60 MG tablet Take 60 mg by mouth daily.      . SYMBICORT 160-4.5 MCG/ACT inhaler Inhale 1 puff into the lungs daily.       No current facility-administered medications for this visit.    OBJECTIVE: Older white woman in no acute distress Filed Vitals:   06/25/14 1233  BP: 124/62  Pulse: 82  Temp: 97.9 F (36.6 C)  Resp: 18     Body mass index is 26.09 kg/(m^2).    ECOG FS:2 - Symptomatic, <50% confined to bed  Ocular: Sclerae unicteric, formal visual fields not tested Ear-nose-throat: Oropharynx clear and moist Lymphatic: No cervical or supraclavicular adenopathy Lungs no rales or rhonchi Heart regular rate and rhythm Abd soft, nontender, positive bowel sounds MSK no focal spinal tenderness, no upper  extremity lymphedema Neuro: non-focal,  well-oriented, anxious affect affect Breasts: The right breast is status post mastectomy and radiation. The area of local recurrences photographed below. The nodules are small, firm, not significantly erythematous, and not tender or itchy. They appear similar to the last visit. The right axilla is benign. The left breast is status post mastectomy. There is no evidence of local recurrence.     LAB RESULTS:  CMP     Component Value Date/Time   NA 138 06/25/2014 1207   NA 138 05/01/2014 1050   K 4.0 06/25/2014 1207   K 5.0 05/01/2014 1050   CL 102 05/01/2014 1050   CO2 28 06/25/2014 1207   CO2 27 05/01/2014 1050   GLUCOSE 95 06/25/2014 1207   GLUCOSE 104* 05/01/2014 1050   BUN 10.7 06/25/2014 1207   BUN 14 05/01/2014 1050   CREATININE 0.8 06/25/2014 1207   CREATININE 0.78 05/01/2014 1050   CREATININE 0.70 01/20/2014 0415   CALCIUM 9.4 06/25/2014 1207   CALCIUM 9.4 05/01/2014 1050   PROT 6.5 06/25/2014 1207   PROT 6.1 05/01/2014 1050   ALBUMIN 3.6 06/25/2014 1207   ALBUMIN 4.0 05/01/2014 1050   AST 19 06/25/2014 1207   AST 16 05/01/2014 1050   ALT 13 06/25/2014 1207   ALT 11 05/01/2014 1050   ALKPHOS 54 06/25/2014 1207   ALKPHOS 45 05/01/2014 1050   BILITOT 0.54 06/25/2014 1207   BILITOT 0.5 05/01/2014 1050   GFRNONAA 73 05/01/2014 1050   GFRNONAA 80* 01/20/2014 0415   GFRAA 84 05/01/2014 1050   GFRAA >90 01/20/2014 0415    I No results found for this basename: SPEP,  UPEP,   kappa and lambda light chains    Lab Results  Component Value Date   WBC 6.2 06/25/2014   NEUTROABS 3.5 06/25/2014   HGB 13.6 06/25/2014   HCT 41.7 06/25/2014   MCV 91.2 06/25/2014   PLT 227 06/25/2014      Chemistry      Component Value Date/Time   NA 138 06/25/2014 1207   NA 138 05/01/2014 1050   K 4.0 06/25/2014 1207   K 5.0 05/01/2014 1050   CL 102 05/01/2014 1050   CO2 28 06/25/2014 1207   CO2 27 05/01/2014 1050   BUN 10.7 06/25/2014 1207   BUN 14 05/01/2014 1050   CREATININE 0.8 06/25/2014 1207   CREATININE 0.78 05/01/2014 1050   CREATININE  0.70 01/20/2014 0415      Component Value Date/Time   CALCIUM 9.4 06/25/2014 1207   CALCIUM 9.4 05/01/2014 1050   ALKPHOS 54 06/25/2014 1207   ALKPHOS 45 05/01/2014 1050   AST 19 06/25/2014 1207   AST 16 05/01/2014 1050   ALT 13 06/25/2014 1207   ALT 11 05/01/2014 1050   BILITOT 0.54 06/25/2014 1207   BILITOT 0.5 05/01/2014 1050       Lab Results  Component Value Date   LABCA2 20 01/31/2014    No components found with this basename: [id number]    No results found for this basename: INR,  in the last 168 hours  Urinalysis    Component Value Date/Time   COLORURINE YELLOW 01/19/2014 1100   APPEARANCEUR CLEAR 01/19/2014 1100   LABSPEC 1.012 01/19/2014 1100   PHURINE 6.5 01/19/2014 1100   GLUCOSEU NEGATIVE 01/19/2014 1100   HGBUR NEGATIVE 01/19/2014 [place] 01/19/2014 1100   KETONESUR NEGATIVE 01/19/2014 1100   PROTEINUR NEGATIVE 01/19/2014 1100   UROBILINOGEN 0.2 01/19/2014 1100   NITRITE  NEGATIVE 01/19/2014 1100   LEUKOCYTESUR TRACE* 01/19/2014 1100    STUDIES: No results found.  ASSESSMENT: 79 y.o.  woman with locally recurrent breast cancer, as follows:  (1) status post right lumpectomy and sentinel node sampling September 2000 for ductal carcinoma in situ (Ni+), estrogen and progesterone receptor positive, with a low proliferation index,  (a) received 5040 cGy to the right breast and regional lymph nodes  (b) on tamoxifen less than 3 months  (2) s/p bilateral mastectomies and right axillary lymph node sampling 07/29/2011, showing  (a) in the left breast, ductal carcinoma in situ measuring 1.2 cm, grade 1, with negative margins, estrogen and progesterone receptor positive  (b)  on the right, a pT1c pN0, stage IA invasive ductal carcinoma, estrogen receptor and 93% positive, progesterone receptor 87% positive, with an MIB-1 of 35%, and no HER-2 amplification margins were close but negative  (c) did not receive adjuvant antiestrogen therapy  (3) right chest wall  skin recurrence resected 01/21/2014, measuring 1.7 cm, with positive margins, estrogen receptor 100% positive, progesterone receptor 75% positive, with an MIB-1 of 80%, and no HER-2 amplification; PET scan 02/15/2014 negative except for Right chest wall focus  (a) received 45 Gy electron-beam therapy to the right chest wall completed 03/27/2014  (b) skin progression within and without the radiation field noted 04/30/2014  (c) letrozole started 05/13/2014  (d) to start palbociclib 07/28/2014  (4) dexa scan 05/09/2014 at [facility] shows osteoporosis with a T score of -2.9  PLAN: We spent approximately 55 minutes today going over [place] situation. Basically, she is tolerating the letrozole without any unusual side effects. She would be ready to start the Palbociclib, but she has a big family occasion coming up in [place] where they will celebrate her and also her birthday at the same time. She does not want to take any chances with side effects interfering with her participation. Accordingly we are not going to start the Palbociclib until November 1. We did review again the possible side effects, toxicities and complications and I have entered weekly lab work to start the first week in November as well as a visit with me a few weeks later. She understands very likely she will deliver a low blood counts and we will have to drop her dose.  She wanted to take her Valium more frequently. We discussed the fact that benzodiazepines can cause confusion in the elderly and she tells me she would only take them when she is home and she will not drive if she is to take them. Right now of her she needs a little something more than Tranxene for her significant anxiety.  Once she is on a stable dose of Palbociclib we will add the zolendronate. We discussed the possible toxicities, side effects and complications of that agent today. She does not feel ready to get started.  Chelisa has a good understanding of the  overall plan. She agrees with it. She knows the goal of treatment in her case is control. She will call with any problems that may develop before her next visit here. [name], MD   06/25/2014 6:54 PM

## 2014-06-27 ENCOUNTER — Encounter: Payer: Self-pay | Admitting: *Deleted

## 2014-06-27 NOTE — Progress Notes (Signed)
Angel [name]  Clinical Social [name] was referred by Futures trader for assessment of psychosocial needs due to possible support needs at home.  Clinical Social Worker contacted patient at home to offer support and assess for needs. Pt interested in [place] program, but does not qualify as she does not have medicaid. She was on her way to a church meeting with a friend and would call CSW back tomorrow.   [name], [place] Worker [place]  [place] Phone: [phone] Fax: [phone]

## 2014-06-27 NOTE — Progress Notes (Signed)
Opened in error

## 2014-06-28 ENCOUNTER — Encounter: Payer: Self-pay | Admitting: *Deleted

## 2014-06-28 NOTE — Progress Notes (Signed)
[place] Work  Clinical Social Work was referred by Futures trader for assessment of psychosocial needs due to possible need for additional support in the home.  Clinical Social Worker contacted patient at home to offer support and assess for needs.  Pt recently had a housekeeper that she paid, but no longer can afford to pay for this service. Pt reports to have a large church family and family support as well. Pt currently has [employer] medicare. Medicaid will pay for CNA services, but medicare does not. CSW educated pt on these issues. Pt reports her daughter is in the process of applying for medicaid for pt. CSW received permission to contact daughter, Adela Lank to explore this a little more. Pt has used [facility] for transportation in the past and we may need to use this option again due to vision issues pt described. CSW also provided pt with information about [employer] that can provide [employer] (paid service) and possible limited transportation. CSW to follow and attempt to assist with additional resources. CSW awaits daughter's return call.    Clinical Social Work interventions: Emotional support Resource Education  [name], [place] Worker [place]  [place] Phone: [phone] Fax: [phone]

## 2014-07-29 ENCOUNTER — Telehealth: Payer: Self-pay | Admitting: *Deleted

## 2014-07-29 NOTE — Telephone Encounter (Signed)
Pt called tothis RN to state concern due to " I took my first dose of the new medication yesterday but I am concerned about the side effects ".  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  Per her concern relating to need to have surgery and possible decrease in WBC from Ibrance that could interfere- medication can be held at present for her to obtain work up with Film/video editor.  Angel [name] is taking letrozole with no issues noted and feels it has been beneficial.  She states " break out on my chest looks less to me ".  Per end of conversation plan is patient will hold Ibrance at present and call this RN on Friday 11/6 with surgical plan "  Angel [name] stated per above plan- " this gives me a lot of peace about everything ".

## 2014-07-31 ENCOUNTER — Other Ambulatory Visit: Payer: Medicare Other

## 2014-08-05 ENCOUNTER — Other Ambulatory Visit: Payer: Self-pay | Admitting: *Deleted

## 2014-08-05 ENCOUNTER — Other Ambulatory Visit: Payer: Self-pay | Admitting: Internal Medicine

## 2014-08-05 ENCOUNTER — Ambulatory Visit: Payer: Self-pay | Admitting: Physician Assistant

## 2014-08-05 MED ORDER — CELECOXIB 200 MG PO CAPS: ORAL_CAPSULE | ORAL | Status: DC

## 2014-08-07 ENCOUNTER — Other Ambulatory Visit: Payer: Medicare Other

## 2014-08-14 ENCOUNTER — Other Ambulatory Visit: Payer: Medicare Other

## 2014-08-15 ENCOUNTER — Ambulatory Visit: Payer: Medicare Other | Admitting: Family Medicine

## 2014-08-15 ENCOUNTER — Telehealth: Payer: Self-pay | Admitting: Internal Medicine

## 2014-08-15 NOTE — Telephone Encounter (Signed)
Pt car broke down and did not have her cell phone, she apologizes and states she's looking forward to meeting [place]. Pt wanted to make Dr. Etter Sjogren aware

## 2014-08-15 NOTE — Telephone Encounter (Signed)
No charge. 

## 2014-08-21 ENCOUNTER — Telehealth: Payer: Self-pay | Admitting: Oncology

## 2014-08-21 ENCOUNTER — Other Ambulatory Visit (HOSPITAL_BASED_OUTPATIENT_CLINIC_OR_DEPARTMENT_OTHER): Payer: Medicare Other

## 2014-08-21 ENCOUNTER — Ambulatory Visit (HOSPITAL_BASED_OUTPATIENT_CLINIC_OR_DEPARTMENT_OTHER): Payer: Medicare Other | Admitting: Oncology

## 2014-08-21 VITALS — BP 135/78 | HR 89 | Temp 97.5°F | Resp 18 | Ht 62.0 in | Wt 142.4 lb

## 2014-08-21 DIAGNOSIS — M810 Age-related osteoporosis without current pathological fracture: Secondary | ICD-10-CM

## 2014-08-21 DIAGNOSIS — C44501 Unspecified malignant neoplasm of skin of breast: Secondary | ICD-10-CM

## 2014-08-21 DIAGNOSIS — C50911 Malignant neoplasm of unspecified site of right female breast: Secondary | ICD-10-CM

## 2014-08-21 DIAGNOSIS — C50912 Malignant neoplasm of unspecified site of left female breast: Secondary | ICD-10-CM

## 2014-08-21 DIAGNOSIS — D0512 Intraductal carcinoma in situ of left breast: Secondary | ICD-10-CM

## 2014-08-21 LAB — COMPREHENSIVE METABOLIC PANEL (CC13)
ALBUMIN: 4 g/dL (ref 3.5–5.0)
ALT: 14 U/L (ref 0–55)
AST: 20 U/L (ref 5–34)
Alkaline Phosphatase: 56 U/L (ref 40–150)
Anion Gap: 10 mEq/L (ref 3–11)
BUN: 11.6 mg/dL (ref 7.0–26.0)
CALCIUM: 10.5 mg/dL — AB (ref 8.4–10.4)
CHLORIDE: 102 meq/L (ref 98–109)
CO2: 28 mEq/L (ref 22–29)
Creatinine: 0.8 mg/dL (ref 0.6–1.1)
Glucose: 116 mg/dl (ref 70–140)
Potassium: 4.2 mEq/L (ref 3.5–5.1)
Sodium: 140 mEq/L (ref 136–145)
Total Bilirubin: 0.26 mg/dL (ref 0.20–1.20)
Total Protein: 6.8 g/dL (ref 6.4–8.3)

## 2014-08-21 LAB — CBC WITH DIFFERENTIAL/PLATELET
BASO%: 0.5 % (ref 0.0–2.0)
Basophils Absolute: 0 10*3/uL (ref 0.0–0.1)
EOS ABS: 0 10*3/uL (ref 0.0–0.5)
EOS%: 0.6 % (ref 0.0–7.0)
HEMATOCRIT: 41.3 % (ref 34.8–46.6)
HGB: 13.4 g/dL (ref 11.6–15.9)
LYMPH#: 2.2 10*3/uL (ref 0.9–3.3)
LYMPH%: 33.5 % (ref 14.0–49.7)
MCH: 29.8 pg (ref 25.1–34.0)
MCHC: 32.4 g/dL (ref 31.5–36.0)
MCV: 92 fL (ref 79.5–101.0)
MONO#: 0.4 10*3/uL (ref 0.1–0.9)
MONO%: 6.4 % (ref 0.0–14.0)
NEUT#: 3.9 10*3/uL (ref 1.5–6.5)
NEUT%: 59 % (ref 38.4–76.8)
Platelets: 238 10*3/uL (ref 145–400)
RBC: 4.49 10*6/uL (ref 3.70–5.45)
RDW: 13.5 % (ref 11.2–14.5)
WBC: 6.7 10*3/uL (ref 3.9–10.3)

## 2014-08-21 MED ORDER — FLUOXETINE HCL 10 MG PO CAPS: 10.0000 mg | ORAL_CAPSULE | Freq: Every day | ORAL | Status: DC

## 2014-08-21 NOTE — Progress Notes (Signed)
[place]  [phone] [phone]) [phone]     ID: Angel [name] DOB: 09-Mar-1932  MR#: [id number]  HCW#:[id number]  Patient Care Team: Angel Pinto, MD as PCP - General (Internal Medicine) Angel Cruel, MD as Consulting Physician (Oncology) Angel Edison, MD as Consulting Physician (Radiation Oncology) Angel Margarita, MD as Consulting Physician (Cardiology) OTHER M.D.: Angel [name] (ophth)  CHIEF COMPLAINT: Chest wall recurrence of breast cancer  CURRENT TREATMENT: Letrozole, Palbociclib   BREAST CANCER HISTORY: Per Dr. 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 She had a second right breast cancer in 2012, with right mastectomy by Dr Angel [name] 07-29-2011 for T1N0M0 ER/PR positive, HER-2 negative invasive ductal carcinoma; she also had what was planned as prophylactic left mastectomy, with unexpected finding of left DCIS in that pathology. Plan in 09-2011 was to begin tamoxifen, chosen particularly due to low bone density. Patient did not keep follow up visits at this office, stopped tamoxifen due intolerance (tho she does not remember what problem she had with this) and was treated with raloxifene by Dr Angel [name].  She was seen next at this office on 12-26-2013 with an enlarging nodular area above right mastectomy scar,reportedly present for several months. Excisional biopsy done 01-21-14 found grade 2 invasive ductal carcinoma with involvement at Angel [name]) and lymphovascular space invasion, ER positive 100%, PR positive 75%, proliferation marker 80% and HER-2 negative by [facility] (pathology [phone]). PET 02-15-14 did not identify any involvement beyond right chest wall. She had 4500 cGy as electron beam radiation from 5-27 thru 03-27-14 to right chest wall. By 04-30-14 there were multiple tiny nodules in region of right mastectomy  scar."  The patient's subsequent history is as detailed below  INTERVAL HISTORY: Angel [name] for followup of her recurrent breast cancer. She continues on letrozole, which she is tolerating essentially without side effects that she is aware of. She also obtains it at a very good price. Since her last visit here she went to [place] for what they told her was a family reunion but turned out to be a prolonged birthday party for her. She was moved and delighted. The only problem was the didn't let her cook her famous banana pudding.  REVIEW OF SYSTEMS: She continues to have pain in the back and the legs primarily and also in the knees. She gets epidural shots through Dr. Nelva Bush and he also prescribes the Vicodin for her. She takes 2 in the morning and 2 at noon and 2 at that time most of the time. She takes Metamucil to handle the constipation and that works well for her. 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 Her vision is blurred. She has a difficulty reading. Sometimes her ankles swell. Sometimes she feels short of breath. This is not constant. She feels anxious and depressed. She has mild hot flashes. She was able to obtain the Ibrance at no cost. A detailed review of systems [name] was otherwise stable  PAST MEDICAL HISTORY: Past Medical History  Diagnosis Date  . Asthma   . Hyperlipemia   . Spinal stenosis   . Osteoporosis   . Chronic bronchitis   . Kidney stones   . DDD (degenerative disc disease)   . Osteoarthritis   . Degenerative joint disease   . Skin cancer of face     non melanoma  .  Hypertension   . Coronary artery disease Non-obstructive    a. 2012 Cath: LAD 40-46m>Med Rx;  b. 11/2013 Echo: EF 50-55%, nl wall motion;  c. 12/2013 Lexi MV: EF 74%, no ischemia.  . Depression   . Anxiety   . COPD (chronic obstructive pulmonary disease)   . Dyslipidemia   . Cholelithiasis   . GERD (gastroesophageal reflux disease)   . Hx of radiation therapy  07/02/1999- 08/11/1999    right supraclavucular/axillary region: 5040 cGy, 28 fractions  . Breast cancer 05/07/1999, 06/2011    a. s/p bilat mastectomies. recurrence 01/2014  . Hx of radiation therapy 02/20/14- 03/27/14    right chest wall 4500 cGy 25 sessions    PAST SURGICAL HISTORY: Past Surgical History  Procedure Laterality Date  . Vesicovaginal fistula closure w/ tah  age 80 . Tubal ligation  1961  . Appendectomy    . Abdominal hysterectomy      in her 30's  . Small intestine surgery  1998    [facility]  . Tonsillectomy    . Breast lumpectomy  2002    right breast  . Mastectomy  07/29/11    bilateral-rt nodes-none on left  . Cardiac catheterization  03/2011, 3/15  . Breast biopsy Right 01/21/2014    Procedure: BREAST/CHEST WALL BIOPSY;  Surgeon: ARalene Ok MD;  [facility] [facility]  Service: General;  Laterality: Right;  . Vein ligation and stripping Right     FAMILY HISTORY Family History  Problem Relation Age of Onset  . Emphysema Mother   . Heart disease Mother   . Pancreatic cancer Mother   . Cancer Mother     pancreatic  . Asthma Brother   . Asthma Daughter   . Asthma Grandchild   . Asthma Brother     deceased  . Heart disease Father   . Heart disease Brother   . Lymphoma Brother    the patient's father died at the age of 652from heart disease. He was a smoker. The patient's mother died at the age of 829with cancer of the pancreas. The patient has 3 brothers, no sisters. There is no history of breast or ovarian cancer in the family to her knowledge.  GYNECOLOGIC HISTORY:  No LMP recorded. Patient has had a hysterectomy. Menarche age 80 first live birth age 80 She is GX P4. She stopped having periods in her 34s She took hormone replacement for approximately 10 years. 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 She has been "single" for 40 years. Her  oldest daughter died from a subarachnoid hemorrhage at age 473 2 years ago. This is when the patient had undergone treatment for invasive breast cancer and she tells me she was simply overwhelmed and could not start the planned antiestrogen therapy. The next daughter, WMariann Laster is a retired pSoftware engineer [place] Next child, [name] works in tCharity fundraiser [place] The youngest, FPilar Plate is retired from the mOffice managerposition. He also owned his own business. The patient has 7 grandchildren and 2 many great-grandchildren to count. She attends the [employer]    ADVANCED DIRECTIVES: In place; the patient's daughter SFreda Munrois her healthcare power of attorney. She can be reached at [facility] [facility]  Substance Use Topics  . Smoking status: Never Smoker   . Smokeless tobacco: Never Used  . Alcohol Use: Yes  Comment: occ wine     Colonoscopy:  PAP:  Bone density:  Lipid panel:  Allergies  Allergen Reactions  . Adhesive [Tape]   . Penicillins Rash    Current Outpatient Prescriptions  Medication Sig Dispense Refill  . albuterol (PROVENTIL HFA;VENTOLIN HFA) 108 (90 BASE) MCG/ACT inhaler Inhale into the lungs every 6 (six) hours as needed for wheezing or shortness of breath.    [name] aspirin EC 81 MG tablet Take 1 tablet (81 mg total) by mouth daily.    . Calcium-Magnesium-Zinc 1000-400-15 MG TABS Take 1 tablet by mouth daily.      . celecoxib (CELEBREX) 200 MG capsule Take 1 capsule daily with food for pain & inflammation 90 capsule 0  . Cholecalciferol (VITAMIN D-3) 5000 UNITS TABS Take 1 tablet by mouth daily.     . clorazepate (TRANXENE-T) 7.5 MG tablet Take 1/2 to 1 tablet 3 x daily if needed for anxiety 90 tablet 3  . diazepam (VALIUM) 5 MG tablet Take 1 tablet (5 mg total) by mouth every 6 (six) hours as needed for anxiety. 30 tablet 0  . DULoxetine (CYMBALTA) 60 MG capsule Take 1 capsule by mouth  every day for mood 90  capsule 0  . ezetimibe (ZETIA) 10 MG tablet Take 10 mg by mouth daily.    . Fish Oil OIL Take 100 mg by mouth 3 (three) times daily.    . Flaxseed, Linseed, (FLAX SEED OIL PO) Take 5 mLs by mouth daily. 1 tsp daily    . HYDROcodone-acetaminophen (NORCO) 10-325 MG per tablet Take 1 tablet by mouth every 4 (four) hours as needed. For pain    . isosorbide mononitrate (IMDUR) 30 MG 24 hr tablet Take 1 tablet (30 mg total) by mouth daily.    [name] letrozole (FEMARA) 2.5 MG tablet Take 1 tablet (2.5 mg total) by mouth daily. 90 tablet 0  . mometasone (NASONEX) 50 MCG/ACT nasal spray Place 2 sprays into the nose daily. 17 g 6  . montelukast (SINGULAIR) 10 MG tablet Take 1 tablet (10 mg total) by mouth at bedtime. 90 tablet 2  . Multiple Vitamins-Minerals (MULTIVITAMINS THER. W/MINERALS) TABS Take 1 tablet by mouth daily.      . nitroGLYCERIN (NITROSTAT) 0.4 MG SL tablet Place 1 tablet (0.4 mg total) under the tongue every 5 (five) minutes x 3 doses as needed for chest pain. 25 tablet 3  . palbociclib (IBRANCE) 125 MG capsule Take 1 capsule (125 mg total) by mouth daily with breakfast. Take whole with food. 21 capsule 0  . raloxifene (EVISTA) 60 MG tablet Take 60 mg by mouth daily.    . SYMBICORT 160-4.5 MCG/ACT inhaler Inhale 1 puff into the lungs daily.     No current facility-administered medications for this visit.    OBJECTIVE: Older white woman who appears younger than stated age 86 Vitals:   08/21/14 1542  BP: 135/78  Pulse: 89  Temp: 97.5 F (36.4 C)  Resp: 18     Body mass index is 26.04 kg/(m^2).    ECOG FS:1 - Symptomatic but completely ambulatory  Sclerae unicteric, the right pupil is round and reactive; there is mild anisotropia Oropharynx clear and moist-- no thrush or other lesions seen No cervical or supraclavicular adenopathy Lungs no rales or rhonchi Heart regular rate and rhythm Abd soft, nontender, positive bowel sounds MSK no focal spinal tenderness, no upper extremity  lymphedema Neuro: nonfocal, well oriented, appropriate affect Breasts: Status post bilateral mastectomy. On the right the local  recurrence areas are little bit flatter and I do not see any new ones. They are photographed below.         LAB RESULTS:  CMP     Component Value Date/Time   NA 140 08/21/2014 1459   NA 138 05/01/2014 1050   K 4.2 08/21/2014 1459   K 5.0 05/01/2014 1050   CL 102 05/01/2014 1050   CO2 28 08/21/2014 1459   CO2 27 05/01/2014 1050   GLUCOSE 116 08/21/2014 1459   GLUCOSE 104* 05/01/2014 1050   BUN 11.6 08/21/2014 1459   BUN 14 05/01/2014 1050   CREATININE 0.8 08/21/2014 1459   CREATININE 0.78 05/01/2014 1050   CREATININE 0.70 01/20/2014 0415   CALCIUM 10.5* 08/21/2014 1459   CALCIUM 9.4 05/01/2014 1050   PROT 6.8 08/21/2014 1459   PROT 6.1 05/01/2014 1050   ALBUMIN 4.0 08/21/2014 1459   ALBUMIN 4.0 05/01/2014 1050   AST 20 08/21/2014 1459   AST 16 05/01/2014 1050   ALT 14 08/21/2014 1459   ALT 11 05/01/2014 1050   ALKPHOS 56 08/21/2014 1459   ALKPHOS 45 05/01/2014 1050   BILITOT 0.26 08/21/2014 1459   BILITOT 0.5 05/01/2014 1050   GFRNONAA 73 05/01/2014 1050   GFRNONAA 80* 01/20/2014 0415   GFRAA 84 05/01/2014 1050   GFRAA >90 01/20/2014 0415    I No results found for: SPEP  Lab Results  Component Value Date   WBC 6.7 08/21/2014   NEUTROABS 3.9 08/21/2014   HGB 13.4 08/21/2014   HCT 41.3 08/21/2014   MCV 92.0 08/21/2014   PLT 238 08/21/2014      Chemistry      Component Value Date/Time   NA 140 08/21/2014 1459   NA 138 05/01/2014 1050   K 4.2 08/21/2014 1459   K 5.0 05/01/2014 1050   CL 102 05/01/2014 1050   CO2 28 08/21/2014 1459   CO2 27 05/01/2014 1050   BUN 11.6 08/21/2014 1459   BUN 14 05/01/2014 1050   CREATININE 0.8 08/21/2014 1459   CREATININE 0.78 05/01/2014 1050   CREATININE 0.70 01/20/2014 0415      Component Value Date/Time   CALCIUM 10.5* 08/21/2014 1459   CALCIUM 9.4 05/01/2014 1050   ALKPHOS 56  08/21/2014 1459   ALKPHOS 45 05/01/2014 1050   AST 20 08/21/2014 1459   AST 16 05/01/2014 1050   ALT 14 08/21/2014 1459   ALT 11 05/01/2014 1050   BILITOT 0.26 08/21/2014 1459   BILITOT 0.5 05/01/2014 1050       Lab Results  Component Value Date   LABCA2 20 01/31/2014    No components found for: [id number]  No results for input(s): INR in the last 168 hours.  Urinalysis    Component Value Date/Time   COLORURINE YELLOW 01/19/2014 1100   APPEARANCEUR CLEAR 01/19/2014 1100   LABSPEC 1.012 01/19/2014 1100   PHURINE 6.5 01/19/2014 1100   GLUCOSEU NEGATIVE 01/19/2014 1100   HGBUR NEGATIVE 01/19/2014 1100   BILIRUBINUR NEGATIVE 01/19/2014 1100   KETONESUR NEGATIVE 01/19/2014 1100   PROTEINUR NEGATIVE 01/19/2014 1100   UROBILINOGEN 0.2 01/19/2014 1100   NITRITE NEGATIVE 01/19/2014 1100   LEUKOCYTESUR TRACE* 01/19/2014 1100    STUDIES: No results found.   ASSESSMENT: 80 y.o. [place] woman with locally recurrent breast cancer, as follows:  (1) status post right lumpectomy and sentinel node sampling September 2000 for ductal carcinoma in situ (Ni+), estrogen and progesterone receptor positive, with a low proliferation index,  (a) received 5040 cGy  to the right breast and regional lymph nodes  (b) on tamoxifen less than 3 months  (2) s/p bilateral mastectomies and right axillary lymph node sampling 07/29/2011, showing  (a) in the left breast, ductal carcinoma in situ measuring 1.2 cm, grade 1, with negative margins, estrogen and progesterone receptor positive  (b)  on the right, a pT1c pN0, stage IA invasive ductal carcinoma, estrogen receptor and 93% positive, progesterone receptor 87% positive, with an MIB-1 of 35%, and no HER-2 amplification margins were close but negative  (c) did not receive adjuvant antiestrogen therapy  (3) right chest wall skin recurrence resected 01/21/2014, measuring 1.7 cm, with positive margins, estrogen receptor 100% positive, progesterone  receptor 75% positive, with an MIB-1 of 80%, and no HER-2 amplification; PET scan 02/15/2014 negative except for Right chest wall focus  (a) received 45 Gy electron-beam therapy to the right chest wall completed 03/27/2014  (b) skin progression within and without the radiation field noted 04/30/2014  (c) letrozole started 05/13/2014  (d) to start palbociclib 08/27/2014  (4) dexa scan 05/09/2014 at [facility] shows osteoporosis with a T score of -2.9  PLAN: Yanci is now ready to start Palbociclib. We again discussed the possible toxicities, side effects and complications of this agent. We are going to follow her blood counts on a weekly basis until we find a dose that she can tolerate at which point we will move out to monthly lab work.  She requested a prescription for Prozac, which she has taken in the past. I was glad to write her for a small dose but she understands I am not as a contrast and I am not familiar with all the possible drug interactions as she is already on Cymbalta. If she has any symptoms from the Prozac she will immediately stop it and let me know.  Her pain is currently well-controlled through Dr. Jeralyn Ruths office. She is using Valium sparingly, mostly at bedtime, she tells me. She is continuing to be as active as possible but is very concerned about her gradual loss of vision.  Telicia has a good understanding of the overall plan. She agrees with it. She knows the goal of treatment in her case is control. She will call with any problems that may develop before her next visit here. Angel Cruel, MD   08/21/2014 4:01 PM

## 2014-08-21 NOTE — Telephone Encounter (Signed)
per pof to sch pt appt-gave pt copy of sch °

## 2014-08-27 ENCOUNTER — Other Ambulatory Visit: Payer: Self-pay | Admitting: *Deleted

## 2014-08-27 MED ORDER — BUDESONIDE-FORMOTEROL FUMARATE 160-4.5 MCG/ACT IN AERO: 2.0000 | INHALATION_SPRAY | Freq: Two times a day (BID) | RESPIRATORY_TRACT | Status: DC | PRN

## 2014-08-28 ENCOUNTER — Encounter: Payer: Self-pay | Admitting: Cardiology

## 2014-08-28 ENCOUNTER — Ambulatory Visit (INDEPENDENT_AMBULATORY_CARE_PROVIDER_SITE_OTHER): Payer: Medicare Other | Admitting: Cardiology

## 2014-08-28 VITALS — BP 108/62 | HR 66 | Ht 62.0 in | Wt 142.0 lb

## 2014-08-28 DIAGNOSIS — I2510 Atherosclerotic heart disease of native coronary artery without angina pectoris: Secondary | ICD-10-CM

## 2014-08-28 DIAGNOSIS — I2583 Coronary atherosclerosis due to lipid rich plaque: Principal | ICD-10-CM

## 2014-08-28 DIAGNOSIS — I493 Ventricular premature depolarization: Secondary | ICD-10-CM

## 2014-08-28 DIAGNOSIS — E782 Mixed hyperlipidemia: Secondary | ICD-10-CM

## 2014-08-28 DIAGNOSIS — I10 Essential (primary) hypertension: Secondary | ICD-10-CM

## 2014-08-28 NOTE — Patient Instructions (Signed)
Your physician recommends that you continue on your current medications as directed. Please refer to the Current Medication list given to you today.  Your physician wants you to follow-up in: 6 months with Dr Turner You will receive a reminder letter in the mail two months in advance. If you don't receive a letter, please call our office to schedule the follow-up appointment.  

## 2014-08-28 NOTE — Progress Notes (Signed)
[place], [place], [place]  [address] Phone: [phone] Fax:  [phone]  Date:  08/28/2014   ID:  Angel [name], DOB 1932-01-04, MRN [id number]  PCP:  Alesia Richards, MD  Cardiologist:  Fransico Him, MD    History of Present Illness: [name] is a 6- y.o. female with a hx of non-obstructive CAD with mid LAD 40-50% stenosis, DDD, HL, breast CA, remote asthma, GERD.This was unchanged from 2012. She has been on several antianginal meds.   Ranolazine was added to her medical regimen but she had to stop it after a few days due to severe fatigue. Patient had significant side effects to metoprolol with fatigue and lethargy and it was stopped. She presents back today for routine followup. She denies any anginal chest pain, SOB, DOE, LE edema, dizziness, palpitations or syncope.   Wt Readings from Last 3 Encounters:  08/28/14 142 lb (64.411 kg)  08/21/14 142 lb 6.4 oz (64.592 kg)  06/25/14 142 lb 11.2 oz (64.728 kg)     Past Medical History  Diagnosis Date  . Asthma   . Hyperlipemia   . Spinal stenosis   . Osteoporosis   . Chronic bronchitis   . Kidney stones   . DDD (degenerative disc disease)   . Osteoarthritis   . Degenerative joint disease   . Skin cancer of face     non melanoma  . Hypertension   . Coronary artery disease Non-obstructive    a. 2012 Cath: LAD 40-62m->Med Rx;  b. 11/2013 Echo: EF 50-55%, nl wall motion;  c. 12/2013 Lexi MV: EF 74%, no ischemia.  . Depression   . Anxiety   . COPD (chronic obstructive pulmonary disease)   . Dyslipidemia   . Cholelithiasis   . GERD (gastroesophageal reflux disease)   . Hx of radiation therapy 07/02/1999- 08/11/1999    right supraclavucular/axillary region: 5040 cGy, 28 fractions  . Breast cancer 05/07/1999, 06/2011    a. s/p bilat mastectomies. recurrence 01/2014  . Hx of radiation therapy 02/20/14- 03/27/14    right chest wall 4500 cGy 25 sessions    Current Outpatient Prescriptions    Medication Sig Dispense Refill  . albuterol (PROVENTIL HFA;VENTOLIN HFA) 108 (90 BASE) MCG/ACT inhaler Inhale into the lungs every 6 (six) hours as needed for wheezing or shortness of breath.    [name] aspirin EC 81 MG tablet Take 1 tablet (81 mg total) by mouth daily.    . budesonide-formoterol (SYMBICORT) 160-4.5 MCG/ACT inhaler Inhale 2 puffs into the lungs 2 (two) times daily as needed. 3 Inhaler 1  . Calcium-Magnesium-Zinc 1000-400-15 MG TABS Take 1 tablet by mouth daily.      . celecoxib (CELEBREX) 200 MG capsule Take 1 capsule daily with food for pain & inflammation 90 capsule 0  . Cholecalciferol (VITAMIN D-3) 5000 UNITS TABS Take 1 tablet by mouth daily.     . diazepam (VALIUM) 5 MG tablet Take 1 tablet (5 mg total) by mouth every 6 (six) hours as needed for anxiety. 30 tablet 0  . DULoxetine (CYMBALTA) 60 MG capsule Take 1 capsule by mouth  every day for mood 90 capsule 0  . ezetimibe (ZETIA) 10 MG tablet Take 10 mg by mouth daily.    . Fish Oil OIL Take 100 mg by mouth 3 (three) times daily.    . Flaxseed, Linseed, (FLAX SEED OIL PO) Take 5 mLs by mouth daily. 1 tsp daily    . HYDROcodone-acetaminophen (NORCO) 10-325 MG per  tablet Take 1 tablet by mouth every 4 (four) hours as needed. For pain    . isosorbide mononitrate (IMDUR) 30 MG 24 hr tablet Take 1 tablet (30 mg total) by mouth daily.    [name] letrozole (FEMARA) 2.5 MG tablet Take 1 tablet (2.5 mg total) by mouth daily. 90 tablet 0  . mometasone (NASONEX) 50 MCG/ACT nasal spray Place 2 sprays into the nose daily. 17 g 6  . montelukast (SINGULAIR) 10 MG tablet Take 1 tablet (10 mg total) by mouth at bedtime. 90 tablet 2  . Multiple Vitamins-Minerals (MULTIVITAMINS THER. W/MINERALS) TABS Take 1 tablet by mouth daily.      . nitroGLYCERIN (NITROSTAT) 0.4 MG SL tablet Place 1 tablet (0.4 mg total) under the tongue every 5 (five) minutes x 3 doses as needed for chest pain. 25 tablet 3  . palbociclib (IBRANCE) 125 MG capsule Take 1 capsule  (125 mg total) by mouth daily with breakfast. Take whole with food. 21 capsule 0  . raloxifene (EVISTA) 60 MG tablet Take 60 mg by mouth daily.     No current facility-administered medications for this visit.    Allergies:    Allergies  Allergen Reactions  . Adhesive [Tape]   . Penicillins Rash    Social History:  The patient  reports that she has never smoked. She has never used smokeless tobacco. She reports that she drinks alcohol. She reports that she does not use illicit drugs.   Family History:  The patient's family history includes Asthma in her brother, brother, daughter, and grandchild; Cancer in her mother; Emphysema in her mother; Heart disease in her brother, father, and mother; Lymphoma in her brother; Pancreatic cancer in her mother.   ROS:  Please see the history of present illness.      All other systems reviewed and negative.   PHYSICAL EXAM: VS:  BP 108/62 mmHg  Pulse 66  Ht 5\' 2"  (1.575 m)  Wt 142 lb (64.411 kg)  BMI 25.97 kg/m2 Well nourished, well developed, in no acute distress HEENT: normal Neck: no JVD Cardiac:  normal S1, S2; RRR; no murmur Lungs:  clear to auscultation bilaterally, no wheezing, rhonchi or rales Abd: soft, nontender, no hepatomegaly Ext: no edema Skin: warm and dry Neuro:  CNs 2-12 intact, no focal abnormalities noted     ASSESSMENT AND PLAN:  1. Chest Pain: Resolved 2. Nonobstructive CAD: Continue aspirin. She is intolerant of statin and ranexa. Continue ASA/Imdur 3. Hyperlipidemia: Continue Zetia. Statin intolerant. 4.    PVCs: She is asymptomatic. She has normal LV function. 5.   HTN well controlled  Followup with me in 3 months  Signed, Fransico Him, MD [facility] 08/28/2014 11:47 AM

## 2014-08-28 NOTE — Addendum Note (Signed)
Addended by: Laureen Abrahams on: 08/28/2014 06:51 PM   Modules accepted: Orders, Medications

## 2014-09-03 ENCOUNTER — Other Ambulatory Visit (HOSPITAL_BASED_OUTPATIENT_CLINIC_OR_DEPARTMENT_OTHER): Payer: Medicare Other

## 2014-09-03 DIAGNOSIS — C50911 Malignant neoplasm of unspecified site of right female breast: Secondary | ICD-10-CM

## 2014-09-03 DIAGNOSIS — C44501 Unspecified malignant neoplasm of skin of breast: Secondary | ICD-10-CM

## 2014-09-03 LAB — CBC WITH DIFFERENTIAL/PLATELET
BASO%: 0.4 % (ref 0.0–2.0)
Basophils Absolute: 0 10*3/uL (ref 0.0–0.1)
EOS%: 1.6 % (ref 0.0–7.0)
Eosinophils Absolute: 0.1 10*3/uL (ref 0.0–0.5)
HCT: 37 % (ref 34.8–46.6)
HGB: 12.1 g/dL (ref 11.6–15.9)
LYMPH#: 1.9 10*3/uL (ref 0.9–3.3)
LYMPH%: 37.2 % (ref 14.0–49.7)
MCH: 30.2 pg (ref 25.1–34.0)
MCHC: 32.7 g/dL (ref 31.5–36.0)
MCV: 92.3 fL (ref 79.5–101.0)
MONO#: 0.1 10*3/uL (ref 0.1–0.9)
MONO%: 2.4 % (ref 0.0–14.0)
NEUT#: 3 10*3/uL (ref 1.5–6.5)
NEUT%: 58.4 % (ref 38.4–76.8)
Platelets: 194 10*3/uL (ref 145–400)
RBC: 4.01 10*6/uL (ref 3.70–5.45)
RDW: 12.9 % (ref 11.2–14.5)
WBC: 5.1 10*3/uL (ref 3.9–10.3)

## 2014-09-05 ENCOUNTER — Ambulatory Visit (INDEPENDENT_AMBULATORY_CARE_PROVIDER_SITE_OTHER): Payer: Medicare Other | Admitting: Physician Assistant

## 2014-09-05 ENCOUNTER — Encounter (HOSPITAL_COMMUNITY): Payer: Self-pay | Admitting: Cardiovascular Disease

## 2014-09-05 VITALS — BP 120/76 | HR 72 | Temp 97.0°F | Resp 18 | Ht 62.0 in | Wt 142.0 lb

## 2014-09-05 DIAGNOSIS — K2100 Gastro-esophageal reflux disease with esophagitis, without bleeding: Secondary | ICD-10-CM

## 2014-09-05 DIAGNOSIS — M5136 Other intervertebral disc degeneration, lumbar region: Secondary | ICD-10-CM

## 2014-09-05 DIAGNOSIS — I2510 Atherosclerotic heart disease of native coronary artery without angina pectoris: Secondary | ICD-10-CM

## 2014-09-05 DIAGNOSIS — G8929 Other chronic pain: Secondary | ICD-10-CM

## 2014-09-05 DIAGNOSIS — R6889 Other general symptoms and signs: Secondary | ICD-10-CM

## 2014-09-05 DIAGNOSIS — I2583 Coronary atherosclerosis due to lipid rich plaque: Secondary | ICD-10-CM

## 2014-09-05 DIAGNOSIS — F329 Major depressive disorder, single episode, unspecified: Secondary | ICD-10-CM

## 2014-09-05 DIAGNOSIS — C50911 Malignant neoplasm of unspecified site of right female breast: Secondary | ICD-10-CM

## 2014-09-05 DIAGNOSIS — I493 Ventricular premature depolarization: Secondary | ICD-10-CM

## 2014-09-05 DIAGNOSIS — Z9181 History of falling: Secondary | ICD-10-CM

## 2014-09-05 DIAGNOSIS — J441 Chronic obstructive pulmonary disease with (acute) exacerbation: Secondary | ICD-10-CM

## 2014-09-05 DIAGNOSIS — E559 Vitamin D deficiency, unspecified: Secondary | ICD-10-CM

## 2014-09-05 DIAGNOSIS — N200 Calculus of kidney: Secondary | ICD-10-CM

## 2014-09-05 DIAGNOSIS — Z0001 Encounter for general adult medical examination with abnormal findings: Secondary | ICD-10-CM

## 2014-09-05 DIAGNOSIS — F32A Depression, unspecified: Secondary | ICD-10-CM

## 2014-09-05 DIAGNOSIS — Z79899 Other long term (current) drug therapy: Secondary | ICD-10-CM

## 2014-09-05 DIAGNOSIS — R222 Localized swelling, mass and lump, trunk: Secondary | ICD-10-CM

## 2014-09-05 DIAGNOSIS — R7303 Prediabetes: Secondary | ICD-10-CM

## 2014-09-05 DIAGNOSIS — I10 Essential (primary) hypertension: Secondary | ICD-10-CM

## 2014-09-05 DIAGNOSIS — Z23 Encounter for immunization: Secondary | ICD-10-CM

## 2014-09-05 DIAGNOSIS — M51369 Other intervertebral disc degeneration, lumbar region without mention of lumbar back pain or lower extremity pain: Secondary | ICD-10-CM

## 2014-09-05 DIAGNOSIS — R32 Unspecified urinary incontinence: Secondary | ICD-10-CM

## 2014-09-05 DIAGNOSIS — M810 Age-related osteoporosis without current pathological fracture: Secondary | ICD-10-CM

## 2014-09-05 DIAGNOSIS — E782 Mixed hyperlipidemia: Secondary | ICD-10-CM

## 2014-09-05 NOTE — Progress Notes (Signed)
MEDICARE ANNUAL WELLNESS VISIT AND FOLLOW UP  Assessment:   1. Essential hypertension - continue medications, DASH diet, exercise and monitor at home. Call if greater than 130/80.  - TSH  2. Coronary artery disease due to lipid rich plaque Continue cardio follow up Control blood pressure, cholesterol, glucose, increase exercise.   3. Osteoporosis Continue follow up, DEXA due 2017  4. Mixed hyperlipidemia -continue medications, check lipids, decrease fatty foods, increase activity.  - Lipid panel  5. Vitamin D deficiency - Vit D  25 hydroxy (rtn osteoporosis monitoring)  6. Nodule of chest wall, right Continue follow up with oncologist  7. Prediabetes Discussed general issues about diabetes pathophysiology and management., Educational material distributed., Suggested low cholesterol diet., Encouraged aerobic exercise., Discussed foot care., Reminded to get yearly retinal exam. - Hemoglobin A1c - HM DIABETES FOOT EXAM  8. Medication management - Magnesium  9. Breast cancer, right breast Continue follow up with oncologist  10. Incontinence in female Rule out infection - Urinalysis, Routine w reflex microscopic - Urine culture  11. PVC's (premature ventricular contractions) Continue cardio follow up  12. COPD remission  13. Reflux esophagitis Continue PPI/H2 blocker, diet discussed  14. DDD (degenerative disc disease), lumbar Continue follow up with Dr. Nelva Bush  15. Kidney stones Increase water, no current sx  16. Chronic pain Continue follow up with Dr. Nelva Bush  17. Depression + screening, continue cymbalta, suggest counseling, does have good support from church/friends, suggest adding prozac 10mg  QD   Plan:   During the course of the visit the patient was educated and counseled about appropriate screening and preventive services including:    Pneumococcal vaccine   Influenza vaccine  Td vaccine  Screening electrocardiogram  Screening  mammography  Bone densitometry screening  Colorectal cancer screening  Diabetes screening  Glaucoma screening  Nutrition counseling   Advanced directives: given info/requested  Screening recommendations, referrals:  Vaccinations: Please see documentation below and orders this visit.   Nutrition assessed and recommended  Colonoscopy declined Mammogram declined Pap smear not indicated Pelvic exam not indicated Recommended yearly ophthalmology/optometry visit for glaucoma screening and checkup Recommended yearly dental visit for hygiene and checkup Advanced directives - requested  Conditions/risks identified: BMI: Discussed weight loss, diet, and increase physical activity.  Increase physical activity: AHA recommends 150 minutes of physical activity a week.  Medications reviewed DEXA- uptodate Urinary Incontinence is an issue: discussed non pharmacology and pharmacology options.  Fall risk: moderate- discussed PT, home fall assessment, medications.    Subjective:   Angel [name] is a 80 y.o. female who presents for Medicare Annual Wellness Visit and 3 month follow up on hypertension,reoccurent breast cancer , hyperlipidemia, depression, vitamin D def.  Date of last medicare wellness visit is unknown.   Her blood pressure has been controlled at home, today their BP is BP: 120/76 mmHg She does not workout. She denies chest pain, shortness of breath, dizziness.  She is on cholesterol medication, zetia and denies myalgias. Her cholesterol is not at goal of 100. The cholesterol last visit was:   Lab Results  Component Value Date   CHOL 204* 05/01/2014   HDL 52 05/01/2014   LDLCALC 130* 05/01/2014   TRIG 109 05/01/2014   CHOLHDL 3.9 05/01/2014   . Last A1C in the office was:  Lab Results  Component Value Date   HGBA1C 5.6 05/01/2014   Patient is on Vitamin D supplement. Lab Results  Component Value Date   VD25OH 101* 05/01/2014     She has  a history of  nonobstructive CAD via normal cath in 2012, still has angina, takes NTG occ, has not for several months.  She has had breast cancer twice,  in 2012, and most recently with a right chest wall reoccurence measuring 1.7cm, PET scan negative for mets, she is on letrozole 2.5mg  and follows with Dr. Jana Hakim.She has had some nausea with the medication but otherwise is tolerating it well. She is also in a clinical trial with Ibrance, has blood work drawn 3 times a week. She states that she is starting to eat a "cancer" diet with increasing veggies.  She has depression, on cymbalta , she requested prozac but she did not start it due to fear of interactions.   She follows with Dr. Nelva Bush for pain which is well controlled. She takes valium as needed.   Names of Other Physician/Practitioners you currently use: 1. [facility]- here for primary care 2. Dr. Karleen Hampshire, eye doctor yearly 3. Dr. Orene Desanctis, dentist, yearly  Patient Care Team: Unk Pinto, MD as PCP - General (Internal Medicine) [name], MD as Consulting Physician (Oncology) Rexene Edison, MD as Consulting Physician (Radiation Oncology) [name], MD as Consulting Physician (Cardiology)  Medication Review Current Outpatient Prescriptions on File Prior to Visit  Medication Sig Dispense Refill  . albuterol (PROVENTIL HFA;VENTOLIN HFA) 108 (90 BASE) MCG/ACT inhaler Inhale into the lungs every 6 (six) hours as needed for wheezing or shortness of breath.    [name] aspirin EC 81 MG tablet Take 1 tablet (81 mg total) by mouth daily.    . budesonide-formoterol (SYMBICORT) 160-4.5 MCG/ACT inhaler Inhale 2 puffs into the lungs 2 (two) times daily as needed. 3 Inhaler 1  . Calcium-Magnesium-Zinc 1000-400-15 MG TABS Take 1 tablet by mouth daily.      . celecoxib (CELEBREX) 200 MG capsule Take 1 capsule daily with food for pain & inflammation 90 capsule 0  . Cholecalciferol (VITAMIN D-3) 5000 UNITS TABS Take 1  tablet by mouth daily.     . diazepam (VALIUM) 5 MG tablet Take 1 tablet (5 mg total) by mouth every 6 (six) hours as needed for anxiety. 30 tablet 0  . DULoxetine (CYMBALTA) 60 MG capsule Take 1 capsule by mouth  every day for mood 90 capsule 0  . ezetimibe (ZETIA) 10 MG tablet Take 10 mg by mouth daily.    . Fish Oil OIL Take 100 mg by mouth 3 (three) times daily.    . Flaxseed, Linseed, (FLAX SEED OIL PO) Take 5 mLs by mouth daily. 1 tsp daily    . isosorbide mononitrate (IMDUR) 30 MG 24 hr tablet Take 1 tablet (30 mg total) by mouth daily.    . montelukast (SINGULAIR) 10 MG tablet Take 1 tablet (10 mg total) by mouth at bedtime. 90 tablet 2  . Multiple Vitamins-Minerals (MULTIVITAMINS THER. W/MINERALS) TABS Take 1 tablet by mouth daily.      . nitroGLYCERIN (NITROSTAT) 0.4 MG SL tablet Place 1 tablet (0.4 mg total) under the tongue every 5 (five) minutes x 3 doses as needed for chest pain. 25 tablet 3  . palbociclib (IBRANCE) 125 MG capsule Take 1 capsule (125 mg total) by mouth daily with breakfast. Take whole with food. (Patient not taking: Reported on 08/28/2014) 21 capsule 0   No current facility-administered medications on file prior to visit.    Current Problems (verified) Patient Active Problem List   Diagnosis Date Noted  . Breast cancer, right breast 05/29/2014  .  CAD (coronary artery disease) 05/23/2014  . Essential hypertension 05/01/2014  . Other abnormal glucose 05/01/2014  . Encounter for long-term (current) use of other medications 05/01/2014  . Nodule of chest wall, right 01/07/2014  . PVC's (premature ventricular contractions) 12/05/2013  . Mixed hyperlipidemia 09/10/2013  . COPD 09/10/2013  . Unspecified vitamin D deficiency 09/10/2013  . Reflux esophagitis 09/10/2013  . Kidney stones   . DDD (degenerative disc disease)   . Chronic pain 02/11/2011  . Asthma   . Osteoporosis   . Osteoarthritis     Screening Tests Health Maintenance  Topic Date Due  .  ZOSTAVAX  08/07/1994  . COLONOSCOPY  08/30/2013  . INFLUENZA VACCINE  04/28/2015  . TETANUS/TDAP  09/27/2018  . PNEUMOCOCCAL POLYSACCHARIDE VACCINE AGE 91 AND OVER  Completed     Immunization History  Administered Date(s) Administered  . Hepatitis A 09/27/2008  . Hepatitis B 09/27/1989  . Influenza Split 06/27/2014  . Pneumococcal Polysaccharide-23 07/18/2009  . Td 09/27/2008    Preventative care: Last colonoscopy: 2004 declines another discussed cologuard Last mammogram: 2012 Last pap smear/pelvic exam: remote   DEXA:04/2014  Echo 11/2013 Stress test neg 2015, normal cath 2012.  PFTs 2012 PET scan 01/2014  Prior vaccinations: TD or Tdap: 2010  Influenza: 2015  Pneumococcal: 2010 Prevnar13: DUE Shingles/Zostavax: 2015   History reviewed: allergies, current medications, past family history, past medical history, past social history, past surgical history and problem list    Medication List       This list is accurate as of: 09/05/14 10:23 AM.  Always use your most recent med list.               albuterol 108 (90 BASE) MCG/ACT inhaler  Commonly known as:  PROVENTIL HFA;VENTOLIN HFA  Inhale into the lungs every 6 (six) hours as needed for wheezing or shortness of breath.     aspirin EC 81 MG tablet  Take 1 tablet (81 mg total) by mouth daily.     budesonide-formoterol 160-4.5 MCG/ACT inhaler  Commonly known as:  SYMBICORT  Inhale 2 puffs into the lungs 2 (two) times daily as needed.     Calcium-Magnesium-Zinc 1000-400-15 MG Tabs  Take 1 tablet by mouth daily.     celecoxib 200 MG capsule  Commonly known as:  CELEBREX  Take 1 capsule daily with food for pain & inflammation     diazepam 5 MG tablet  Commonly known as:  VALIUM  Take 1 tablet (5 mg total) by mouth every 6 (six) hours as needed for anxiety.     DULoxetine 60 MG capsule  Commonly known as:  CYMBALTA  Take 1 capsule by mouth  every day for mood     ezetimibe 10 MG tablet  Commonly known  as:  ZETIA  Take 10 mg by mouth daily.     Fish Oil Oil  Take 100 mg by mouth 3 (three) times daily.     FLAX SEED OIL PO  Take 5 mLs by mouth daily. 1 tsp daily     isosorbide mononitrate 30 MG 24 hr tablet  Commonly known as:  IMDUR  Take 1 tablet (30 mg total) by mouth daily.     montelukast 10 MG tablet  Commonly known as:  SINGULAIR  Take 1 tablet (10 mg total) by mouth at bedtime.     multivitamins ther. w/minerals Tabs tablet  Take 1 tablet by mouth daily.     nitroGLYCERIN 0.4 MG SL tablet  Commonly known  as:  NITROSTAT  Place 1 tablet (0.4 mg total) under the tongue every 5 (five) minutes x 3 doses as needed for chest pain.     palbociclib 125 MG capsule  Commonly known as:  IBRANCE  Take 1 capsule (125 mg total) by mouth daily with breakfast. Take whole with food.     Vitamin D-3 5000 UNITS Tabs  Take 1 tablet by mouth daily.        Past Surgical History  Procedure Laterality Date  . Vesicovaginal fistula closure w/ tah  age 89  . Tubal ligation  1961  . Appendectomy    . Abdominal hysterectomy      in her 30's  . Small intestine surgery  1998    SBO  . Tonsillectomy    . Breast lumpectomy  2002    right breast  . Mastectomy  07/29/11    bilateral-rt nodes-none on left  . Cardiac catheterization  03/2011, 3/15  . Breast biopsy Right 01/21/2014    Procedure: BREAST/CHEST WALL BIOPSY;  Surgeon: [name], MD;  Location: [place];  Service: General;  Laterality: Right;  . Vein ligation and stripping Right    Family History  Problem Relation Age of Onset  . Emphysema Mother   . Heart disease Mother   . Pancreatic cancer Mother   . Cancer Mother     pancreatic  . Asthma Brother   . Asthma Daughter   . Asthma Grandchild   . Asthma Brother     deceased  . Heart disease Father   . Heart disease Brother   . Lymphoma Brother     Risk Factors: Osteoporosis/FallRisk: postmenopausal estrogen deficiency and dietary calcium and/or vitamin D  deficiency In the past year have you fallen or had a near fall?:No History of fracture in the past year: no  Tobacco History  Substance Use Topics  . Smoking status: Never Smoker   . Smokeless tobacco: Never Used  . Alcohol Use: Yes     Comment: occ wine   She does not smoke.  Patient is not a former smoker. Are there smokers in your home (other than you)?  No  Alcohol Current alcohol use: none  Caffeine Current caffeine use: coffee 1 /day  Exercise Current exercise: none  Nutrition/Diet Current diet: in general, a "healthy" diet    Cardiac risk factors: advanced age (older than 52 for men, 70 for women), dyslipidemia, hypertension and sedentary lifestyle.  Depression Screen (Note: if answer to either of the following is "Yes", a more complete depression screening is indicated)   Q1: Over the past two weeks, have you felt down, depressed or hopeless? Yes  Q2: Over the past two weeks, have you felt little interest or pleasure in doing things? Yes  Have you lost interest or pleasure in daily life? No  Do you often feel hopeless? No  Do you cry easily over simple problems? No  Activities of Daily Living In your present state of health, do you have any difficulty performing the following activities?:  Driving? Yes at night only, followed very closely at eye doctor Managing money?  No Feeding yourself? No Getting from bed to chair? No Climbing a flight of stairs? Yes- fatigue Preparing food and eating?: No Bathing or showering? No Getting dressed: No Getting to the toilet? No Using the toilet:No Moving around from place to place: No   Are you sexually active?  No  Do you have more than one partner?  No  Vision Difficulties:  Yes  Hearing Difficulties: No Do you often ask people to speak up or repeat themselves? No Do you experience ringing or noises in your ears? No Do you have difficulty understanding soft or whispered voices? No  Cognition  Do you feel that  you have a problem with memory?No  Do you often misplace items? No  Do you feel safe at home?  Yes  Advanced directives Does patient have a [place]? Yes Does patient have a Living Will? Yes   Objective:   Blood pressure 120/76, pulse 72, temperature 97 F (36.1 C), resp. rate 18, height 5\' 2"  (1.575 m), weight 142 lb (64.411 kg). Body mass index is 25.97 kg/(m^2).  General appearance: alert, no distress, WD/WN,  female Cognitive Testing  Alert? Yes  Normal Appearance?Yes  Oriented to person? Yes  Place? Yes   Time? Yes  Recall of three objects?  Yes  Can perform simple calculations? Yes  Displays appropriate judgment?Yes  Can read the correct time from a watch face?Yes  HEENT: normocephalic, sclerae anicteric, TMs pearly, nares patent, no discharge or erythema, pharynx normal Oral cavity: MMM, no lesions Neck: supple, no lymphadenopathy, no thyromegaly, no masses Heart: RRR, normal S1, S2, no murmurs Lungs: CTA bilaterally, no wheezes, rhonchi, or rales Abdomen: +bs, soft, non tender, non distended, no masses, no hepatomegaly, no splenomegaly Musculoskeletal: nontender, no swelling, no obvious deformity Extremities: no edema, no cyanosis, no clubbing Pulses: 2+ symmetric, upper and lower extremities, normal cap refill Neurological: alert, oriented x 3, CN2-12 intact, strength normal upper extremities and lower extremities, sensation normal throughout, DTRs 2+ throughout, no cerebellar signs, gait normal Psychiatric: normal affect, behavior normal, pleasant  Breast: defer Gyn: defer Rectal: defer  Medicare Attestation I have personally reviewed: The patient's medical and social history Their use of alcohol, tobacco or illicit drugs Their current medications and supplements The patient's functional ability including ADLs,fall risks, home safety risks, cognitive, and hearing and visual impairment Diet and physical activities Evidence for depression or  mood disorders  The patient's weight, height, BMI, and visual acuity have been recorded in the chart.  I have made referrals, counseling, and provided education to the patient based on review of the above and I have provided the patient with a written personalized care plan for preventive services.     Vicie Mutters, PA-C   09/05/2014

## 2014-09-05 NOTE — Patient Instructions (Addendum)
Anticancer: a new way of living Shanon Brow Servan-Schreiber,MD,PhD  Cologuard is an easy to use noninvasive colon cancer screening test based on the latest advances in stool DNA science.   Colon cancer is 3rd most diagnosed cancer and 2nd leading cause of death in both men and women 78 years of age and older despite being one of the most preventable and treatable cancers if found early. You have agreed to do a Cologuard screening and have declined a colonoscopy in spite of being explained the risks and benefits of the colonoscopy in detail, including cancer and death. Please understand that this is test not as sensitive or specific as a colonoscopy and you are still recommended to get a colonoscopy.   If you are NOT medicare please call your insurance company and given them this CPT code, [phone], in order to see how much your insurance company will cover.   You will receive a short call from [facility] support center at [employer], when you receive a call they will say they are from [place],  to confirm your mailing address and give you more information.  When they calll you, it will appear on the caller ID as "[employer]" or in some cases only this number will appear, [phone].   [employer] will ship your collection kit directly to you. You will collect a single stool sample in the privacy of your own home, no special preparation required. You will return the kit via [place] pre-paid shipping or pick-up, in the same box it arrived in. Then I will contact you to discuss your results after I receive them from the laboratory.   If you have any questions or concerns, Cologuard Customer Support Specialist are available 24 hours a day, 7 days a week at [phone] or go to [email or web address].       Preventative Care for Adults - Female      MAINTAIN REGULAR HEALTH EXAMS:  A routine yearly physical is a good way to check in with your primary care  provider about your health and preventive screening. It is also an opportunity to share updates about your health and any concerns you have, and receive a thorough all-over exam.   Most health insurance companies pay for at least some preventative services.  Check with your health plan for specific coverages.  WHAT PREVENTATIVE SERVICES DO WOMEN NEED?  Adult women should have their weight and blood pressure checked regularly.   Women age 60 and older should have their cholesterol levels checked regularly.  Women should be screened for cervical cancer with a Pap smear and pelvic exam beginning at either age 69, or 3 years after they become sexually activity.    Breast cancer screening generally begins at age 49 with a mammogram and breast exam by your primary care provider.    Beginning at age 33 and continuing to age 78, women should be screened for colorectal cancer.  Certain people may need continued testing until age 69.  Updating vaccinations is part of preventative care.  Vaccinations help protect against diseases such as the flu.  Osteoporosis is a disease in which the bones lose minerals and strength as we age. Women ages 20 and over should discuss this with their caregivers, as should women after menopause who have other risk factors.  Lab tests are generally done as part of preventative care to screen for anemia and blood disorders, to screen for problems with the kidneys and liver, to screen for bladder problems,  to check blood sugar, and to check your cholesterol level.  Preventative services generally include counseling about diet, exercise, avoiding tobacco, drugs, excessive alcohol consumption, and sexually transmitted infections.    GENERAL RECOMMENDATIONS FOR GOOD HEALTH:  Healthy diet:  Eat a variety of foods, including fruit, vegetables, animal or vegetable protein, such as meat, fish, chicken, and eggs, or beans, lentils, tofu, and grains, such as rice.  Drink plenty  of water daily.  Decrease saturated fat in the diet, avoid lots of red meat, processed foods, sweets, fast foods, and fried foods.  Exercise:  Aerobic exercise helps maintain good heart health. At least 30-40 minutes of moderate-intensity exercise is recommended. For example, a brisk walk that increases your heart rate and breathing. This should be done on most days of the week.   Find a type of exercise or a variety of exercises that you enjoy so that it becomes a part of your daily life.  Examples are running, walking, swimming, water aerobics, and biking.  For motivation and support, explore group exercise such as aerobic class, spin class, Zumba, Yoga,or  martial arts, etc.    Set exercise goals for yourself, such as a certain weight goal, walk or run in a race such as a 5k walk/run.  Speak to your primary care provider about exercise goals.  Disease prevention:  If you smoke or chew tobacco, find out from your caregiver how to quit. It can literally save your life, no matter how long you have been a tobacco user. If you do not use tobacco, never begin.   Maintain a healthy diet and normal weight. Increased weight leads to problems with blood pressure and diabetes.   The Body Mass Index or BMI is a way of measuring how much of your body is fat. Having a BMI above 27 increases the risk of heart disease, diabetes, hypertension, stroke and other problems related to obesity. Your caregiver can help determine your BMI and based on it develop an exercise and dietary program to help you achieve or maintain this important measurement at a healthful level.  High blood pressure causes heart and blood vessel problems.  Persistent high blood pressure should be treated with medicine if weight loss and exercise do not work.   Fat and cholesterol leaves deposits in your arteries that can block them. This causes heart disease and vessel disease elsewhere in your body.  If your cholesterol is found to be  high, or if you have heart disease or certain other medical conditions, then you may need to have your cholesterol monitored frequently and be treated with medication.   Ask if you should have a cardiac stress test if your history suggests this. A stress test is a test done on a treadmill that looks for heart disease. This test can find disease prior to there being a problem.  Menopause can be associated with physical symptoms and risks. Hormone replacement therapy is available to decrease these. You should talk to your caregiver about whether starting or continuing to take hormones is right for you.   Osteoporosis is a disease in which the bones lose minerals and strength as we age. This can result in serious bone fractures. Risk of osteoporosis can be identified using a bone density scan. Women ages 20 and over should discuss this with their caregivers, as should women after menopause who have other risk factors. Ask your caregiver whether you should be taking a calcium supplement and Vitamin D, to reduce the rate of  osteoporosis.   Avoid drinking alcohol in excess (more than two drinks per day).  Avoid use of street drugs. Do not share needles with anyone. Ask for professional help if you need assistance or instructions on stopping the use of alcohol, cigarettes, and/or drugs.  Brush your teeth twice a day with fluoride toothpaste, and floss once a day. Good oral hygiene prevents tooth decay and gum disease. The problems can be painful, unattractive, and can cause other health problems. Visit your dentist for a routine oral and dental check up and preventive care every 6-12 months.   Look at your skin regularly.  Use a mirror to look at your back. Notify your caregivers of changes in moles, especially if there are changes in shapes, colors, a size larger than a pencil eraser, an irregular border, or development of new moles.  Safety:  Use seatbelts 100% of the time, whether driving or as a  passenger.  Use safety devices such as hearing protection if you work in environments with loud noise or significant background noise.  Use safety glasses when doing any work that could send debris in to the eyes.  Use a helmet if you ride a bike or motorcycle.  Use appropriate safety gear for contact sports.  Talk to your caregiver about gun safety.  Use sunscreen with a SPF (or skin protection factor) of 15 or greater.  Lighter skinned people are at a greater risk of skin cancer. Don't forget to also wear sunglasses in order to protect your eyes from too much damaging sunlight. Damaging sunlight can accelerate cataract formation.   Practice safe sex. Use condoms. Condoms are used for birth control and to help reduce the spread of sexually transmitted infections (or STIs).  Some of the STIs are gonorrhea (the clap), chlamydia, syphilis, trichomonas, herpes, HPV (human papilloma virus) and HIV (human immunodeficiency virus) which causes AIDS. The herpes, HIV and HPV are viral illnesses that have no cure. These can result in disability, cancer and death.   Keep carbon monoxide and smoke detectors in your home functioning at all times. Change the batteries every 6 months or use a model that plugs into the wall.   Vaccinations:  Stay up to date with your tetanus shots and other required immunizations. You should have a booster for tetanus every 10 years. Be sure to get your flu shot every year, since 5%-20% of the [place] population comes down with the flu. The flu vaccine changes each year, so being vaccinated once is not enough. Get your shot in the fall, before the flu season peaks.   Other vaccines to consider:  Human Papilloma Virus or HPV causes cancer of the cervix, and other infections that can be transmitted from person to person. There is a vaccine for HPV, and females should get immunized between the ages of 76 and 19. It requires a series of 3 shots.   Pneumococcal vaccine to protect against  certain types of pneumonia.  This is normally recommended for adults age 50 or older.  However, adults younger than 78 years old with certain underlying conditions such as diabetes, heart or lung disease should also receive the vaccine.  Shingles vaccine to protect against Varicella Zoster if you are older than age 70, or younger than 78 years old with certain underlying illness.  Hepatitis A vaccine to protect against a form of infection of the liver by a virus acquired from food.  Hepatitis B vaccine to protect against a form of infection of the  liver by a virus acquired from blood or body fluids, particularly if you work in health care.  If you plan to travel internationally, check with your local health department for specific vaccination recommendations.  Cancer Screening:  Breast cancer screening is essential to preventive care for women. All women age 32 and older should perform a breast self-exam every month. At age 32 and older, women should have their caregiver complete a breast exam each year. Women at ages 46 and older should have a mammogram (x-ray film) of the breasts. Your caregiver can discuss how often you need mammograms.    Cervical cancer screening includes taking a Pap smear (sample of cells examined under a microscope) from the cervix (end of the uterus). It also includes testing for HPV (Human Papilloma Virus, which can cause cervical cancer). Screening and a pelvic exam should begin at age 70, or 3 years after a woman becomes sexually active. Screening should occur every year, with a Pap smear but no HPV testing, up to age 39. After age 16, you should have a Pap smear every 3 years with HPV testing, if no HPV was found previously.   Most routine colon cancer screening begins at the age of 47. On a yearly basis, doctors may provide special easy to use take-home tests to check for hidden blood in the stool. Sigmoidoscopy or colonoscopy can detect the earliest forms of colon  cancer and is life saving. These tests use a small camera at the end of a tube to directly examine the colon. Speak to your caregiver about this at age 57, when routine screening begins (and is repeated every 5 years unless early forms of pre-cancerous polyps or small growths are found).

## 2014-09-06 ENCOUNTER — Other Ambulatory Visit: Payer: Medicare Other

## 2014-09-06 LAB — URINALYSIS, ROUTINE W REFLEX MICROSCOPIC
BILIRUBIN URINE: NEGATIVE
Glucose, UA: NEGATIVE mg/dL
HGB URINE DIPSTICK: NEGATIVE
Ketones, ur: NEGATIVE mg/dL
Leukocytes, UA: NEGATIVE
NITRITE: NEGATIVE
Protein, ur: NEGATIVE mg/dL
Specific Gravity, Urine: 1.006 (ref 1.005–1.030)
Urobilinogen, UA: 0.2 mg/dL (ref 0.0–1.0)
pH: 6.5 (ref 5.0–8.0)

## 2014-09-06 LAB — LIPID PANEL
Cholesterol: 159 mg/dL (ref 0–200)
HDL: 60 mg/dL (ref >39)
LDL Cholesterol: 86 mg/dL (ref 0–99)
Total CHOL/HDL Ratio: 2.7 Ratio
Triglycerides: 67 mg/dL (ref <150)
VLDL: 13 mg/dL (ref 0–40)

## 2014-09-06 LAB — TSH: TSH: 3.234 u[IU]/mL (ref 0.350–4.500)

## 2014-09-06 LAB — MAGNESIUM: MAGNESIUM: 1.9 mg/dL (ref 1.5–2.5)

## 2014-09-07 LAB — HEMOGLOBIN A1C
Hgb A1c MFr Bld: 5.4 % (ref <5.7)
MEAN PLASMA GLUCOSE: 108 mg/dL (ref <117)

## 2014-09-07 LAB — URINE CULTURE: Colony Count: 10000

## 2014-09-07 LAB — VITAMIN D 25 HYDROXY (VIT D DEFICIENCY, FRACTURES): Vit D, 25-Hydroxy: 91 ng/mL (ref 30–100)

## 2014-09-09 ENCOUNTER — Other Ambulatory Visit: Payer: Self-pay | Admitting: *Deleted

## 2014-09-09 NOTE — Progress Notes (Signed)
Pt called to this RN to state she feels since starting the Ibrance she has had multiple side effects including - nausea, fatigue ( interfering with ADL's ), decreased appetite.  Pooja states she is very concerned about being on the [place] and having scheduled eye surgery ( cataracts and retinal ) in January.  Pt is scheduled for lab work tomorrow at this office.  Per call pt will hold Ibrance at this time.  She will keep appointment for lab as scheduled.  This note will be given to MD for review.

## 2014-09-10 ENCOUNTER — Encounter: Payer: Self-pay | Admitting: Emergency Medicine

## 2014-09-10 ENCOUNTER — Other Ambulatory Visit (HOSPITAL_BASED_OUTPATIENT_CLINIC_OR_DEPARTMENT_OTHER): Payer: Medicare Other

## 2014-09-10 DIAGNOSIS — C50911 Malignant neoplasm of unspecified site of right female breast: Secondary | ICD-10-CM

## 2014-09-10 DIAGNOSIS — D0512 Intraductal carcinoma in situ of left breast: Secondary | ICD-10-CM

## 2014-09-10 DIAGNOSIS — C44501 Unspecified malignant neoplasm of skin of breast: Secondary | ICD-10-CM

## 2014-09-10 LAB — CBC WITH DIFFERENTIAL/PLATELET
BASO%: 0.5 % (ref 0.0–2.0)
Basophils Absolute: 0 10*3/uL (ref 0.0–0.1)
EOS ABS: 0 10*3/uL (ref 0.0–0.5)
EOS%: 1.1 % (ref 0.0–7.0)
HCT: 37.4 % (ref 34.8–46.6)
HGB: 12 g/dL (ref 11.6–15.9)
LYMPH%: 38.2 % (ref 14.0–49.7)
MCH: 29.7 pg (ref 25.1–34.0)
MCHC: 32.1 g/dL (ref 31.5–36.0)
MCV: 92.4 fL (ref 79.5–101.0)
MONO#: 0.1 10*3/uL (ref 0.1–0.9)
MONO%: 3.8 % (ref 0.0–14.0)
NEUT%: 56.4 % (ref 38.4–76.8)
NEUTROS ABS: 1.7 10*3/uL (ref 1.5–6.5)
PLATELETS: 138 10*3/uL — AB (ref 145–400)
RBC: 4.05 10*6/uL (ref 3.70–5.45)
RDW: 12.9 % (ref 11.2–14.5)
WBC: 3 10*3/uL — ABNORMAL LOW (ref 3.9–10.3)
lymph#: 1.2 10*3/uL (ref 0.9–3.3)

## 2014-09-10 NOTE — Progress Notes (Signed)
Patient in the office for scheduled lab work.  Patient requesting to see Dr Virgie Dad nurse.  Patient states that she stopped the Ibrance today 09/10/14 with her last dose being 09/09/14.  States that she experienced some fatigue, nausea and visual changes while taking the Ibrance.   States that she is planning to have eye surgery the 1st of the year and would like to have that done before she considers restarting the [place].  Printed the labs and current calendar for patient. Instructed her to return in 1 week as scheduled for labs and office visit with Susanne Borders NP.   Instructed patient to call in the meantime for any questions or concerns. Patient verbalized understanding.

## 2014-09-13 ENCOUNTER — Telehealth: Payer: Self-pay | Admitting: *Deleted

## 2014-09-13 NOTE — Telephone Encounter (Signed)
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  This RN reviewed labs and noted WBC of 3.0, ANC of 1.7 and plts of 137. Pt is now holding Ibrance due to side effects she felt was associated with starting the medication.   This RN will review above with MD as well as contact pt this afternoon for more inquiry of concern.

## 2014-09-16 ENCOUNTER — Other Ambulatory Visit: Payer: Self-pay | Admitting: Physician Assistant

## 2014-09-17 ENCOUNTER — Encounter: Payer: Self-pay | Admitting: Nurse Practitioner

## 2014-09-17 ENCOUNTER — Ambulatory Visit (HOSPITAL_BASED_OUTPATIENT_CLINIC_OR_DEPARTMENT_OTHER): Payer: Medicare Other | Admitting: Lab

## 2014-09-17 ENCOUNTER — Telehealth: Payer: Self-pay | Admitting: Oncology

## 2014-09-17 ENCOUNTER — Ambulatory Visit (HOSPITAL_BASED_OUTPATIENT_CLINIC_OR_DEPARTMENT_OTHER): Payer: Medicare Other | Admitting: Nurse Practitioner

## 2014-09-17 VITALS — BP 125/58 | HR 70 | Temp 97.8°F | Resp 18 | Ht 62.0 in | Wt 142.1 lb

## 2014-09-17 DIAGNOSIS — C44501 Unspecified malignant neoplasm of skin of breast: Secondary | ICD-10-CM

## 2014-09-17 DIAGNOSIS — R110 Nausea: Secondary | ICD-10-CM

## 2014-09-17 DIAGNOSIS — C50911 Malignant neoplasm of unspecified site of right female breast: Secondary | ICD-10-CM

## 2014-09-17 LAB — CBC WITH DIFFERENTIAL/PLATELET
BASO%: 0.4 % (ref 0.0–2.0)
Basophils Absolute: 0 10*3/uL (ref 0.0–0.1)
EOS ABS: 0 10*3/uL (ref 0.0–0.5)
EOS%: 0.1 % (ref 0.0–7.0)
HEMATOCRIT: 38 % (ref 34.8–46.6)
HEMOGLOBIN: 12.4 g/dL (ref 11.6–15.9)
LYMPH#: 0.7 10*3/uL — AB (ref 0.9–3.3)
LYMPH%: 19.4 % (ref 14.0–49.7)
MCH: 30.3 pg (ref 25.1–34.0)
MCHC: 32.6 g/dL (ref 31.5–36.0)
MCV: 92.8 fL (ref 79.5–101.0)
MONO#: 0.1 10*3/uL (ref 0.1–0.9)
MONO%: 2.5 % (ref 0.0–14.0)
NEUT%: 77.6 % — AB (ref 38.4–76.8)
NEUTROS ABS: 2.8 10*3/uL (ref 1.5–6.5)
PLATELETS: 171 10*3/uL (ref 145–400)
RBC: 4.09 10*6/uL (ref 3.70–5.45)
RDW: 13.4 % (ref 11.2–14.5)
WBC: 3.6 10*3/uL — AB (ref 3.9–10.3)

## 2014-09-17 MED ORDER — PROCHLORPERAZINE MALEATE 10 MG PO TABS: 10.0000 mg | ORAL_TABLET | Freq: Four times a day (QID) | ORAL | Status: DC | PRN

## 2014-09-17 NOTE — Telephone Encounter (Signed)
, °

## 2014-09-17 NOTE — Progress Notes (Signed)
[place]  Telephone:(336) [phone] Fax:(336) [phone]     ID: Angel [name] DOB: 07/26/1932  MR#: [id number]  EVO#:[id number]  Patient Care Team: Unk Pinto, MD as PCP - General (Internal Medicine) [name], MD as Consulting Physician (Oncology) Rexene Edison, MD as Consulting Physician (Radiation Oncology) [name], MD as Consulting Physician (Cardiology) OTHER M.D.: Maisie Fus Kurup (ophth)  CHIEF COMPLAINT: Chest wall recurrence of breast cancer CURRENT TREATMENT: Letrozole, Palbociclib  BREAST CANCER HISTORY: Per Dr. 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 She had a second right breast cancer in 2012, with right mastectomy by Dr [name] 07-29-2011 for T1N0M0 ER/PR positive, HER-2 negative invasive ductal carcinoma; she also had what was planned as prophylactic left mastectomy, with unexpected finding of left DCIS in that pathology. Plan in 09-2011 was to begin tamoxifen, chosen particularly due to low bone density. Patient did not keep follow up visits at this office, stopped tamoxifen due intolerance (tho she does not remember what problem she had with this) and was treated with raloxifene by Dr [name].  She was seen next at this office on 12-26-2013 with an enlarging nodular area above right mastectomy scar,reportedly present for several months. Excisional biopsy done 01-21-14 found grade 2 invasive ductal carcinoma with involvement at [facility]) and lymphovascular space invasion, ER positive 100%, PR positive 75%, proliferation marker 80% and HER-2 negative by [facility] (pathology [phone]). PET 02-15-14 did not identify any involvement beyond right chest wall. She had 4500 cGy as electron beam radiation from 5-27 thru 03-27-14 to right chest wall. By 04-30-14 there were multiple tiny nodules in region of right mastectomy  scar."  The patient's subsequent history is as detailed below  INTERVAL HISTORY: Angel [name] returns today for follow up of her recurrent breast cancer. She continues on letrozole, which she is tolerating essentially without side effects that she is aware of. She had to stop the palbociclib several days in advance of her 21 day mark. She was overcome with nausea, fatigue, chest pain, and headaches. She stopped this about 1 week ago. She continues to be nauseous but does not have any PRN antiemetics. Her energy level is still low. The chest pain and headaches have subsided.   REVIEW OF SYSTEMS: She continues to have pain in the back and the legs primarily and also in the knees. She gets epidural shots through Dr. Nelva Bush and he also prescribes the Vicodin for her. She takes 2 in the morning and 2 at noon and 2 at that time most of the time. She takes Metamucil to handle the constipation and that works well for her. 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 Her vision is blurred. She has a difficulty reading. Sometimes her ankles swell. Sometimes she feels short of breath. This is not constant. She feels anxious and depressed. She has mild hot flashes. A detailed review of systems today was otherwise stable.  PAST MEDICAL HISTORY: Past Medical History  Diagnosis Date  . Asthma   . Hyperlipemia   . Spinal stenosis   . Osteoporosis   . Chronic bronchitis   . Kidney stones   . DDD (degenerative disc disease)   . Osteoarthritis   . Degenerative joint disease   . Skin cancer of face     non melanoma  . Hypertension   . Coronary artery disease Non-obstructive  a. 2012 Cath: LAD 40-55m>Med Rx;  b. 11/2013 Echo: EF 50-55%, nl wall motion;  c. 12/2013 Lexi MV: EF 74%, no ischemia.  . Depression   . Anxiety   . COPD (chronic obstructive pulmonary disease)   . Dyslipidemia   . Cholelithiasis   . GERD (gastroesophageal reflux disease)   . Hx of radiation therapy 07/02/1999-  08/11/1999    right supraclavucular/axillary region: 5040 cGy, 28 fractions  . Breast cancer 05/07/1999, 06/2011    a. s/p bilat mastectomies. recurrence 01/2014  . Hx of radiation therapy 02/20/14- 03/27/14    right chest wall 4500 cGy 25 sessions    PAST SURGICAL HISTORY: Past Surgical History  Procedure Laterality Date  . Vesicovaginal fistula closure w/ tah  age 80 . Tubal ligation  1961  . Appendectomy    . Abdominal hysterectomy      in her 30's  . Small intestine surgery  1998    [facility]  . Tonsillectomy    . Breast lumpectomy  2002    right breast  . Mastectomy  07/29/11    bilateral-rt nodes-none on left  . Cardiac catheterization  03/2011, 3/15  . Breast biopsy Right 01/21/2014    Procedure: BREAST/CHEST WALL BIOPSY;  Surgeon: ARalene Ok MD;  [place] [facility]  Service: General;  Laterality: Right;  . Vein ligation and stripping Right   . Left heart catheterization with coronary angiogram N/A 12/05/2013    Procedure: LEFT HEART CATHETERIZATION WITH CORONARY ANGIOGRAM;  Surgeon: CBurnell Blanks MD;  [facility];  Service: Cardiovascular;  Laterality: N/A;    FAMILY HISTORY Family History  Problem Relation Age of Onset  . Emphysema Mother   . Heart disease Mother   . Pancreatic cancer Mother   . Cancer Mother     pancreatic  . Asthma Brother   . Asthma Daughter   . Asthma Grandchild   . Asthma Brother     deceased  . Heart disease Father   . Heart disease Brother   . Lymphoma Brother    the patient's father died at the age of 650from heart disease. He was a smoker. The patient's mother died at the age of 836with cancer of the pancreas. The patient has 3 brothers, no sisters. There is no history of breast or ovarian cancer in the family to her knowledge.  GYNECOLOGIC HISTORY:  No LMP recorded. Patient has had a hysterectomy. Menarche age 80 first live birth age 80 She is GX P4. She stopped having periods in her 359s She took hormone replacement  for approximately 10 years. 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 She has been "single" for 40 years. Her oldest daughter died from a subarachnoid hemorrhage at age 78 2 years ago. This is when the patient had undergone treatment for invasive breast cancer and she tells me she was simply overwhelmed and could not start the planned antiestrogen therapy. The next daughter, WMariann [name] is a retired pSoftware engineer [place] Next child, SFreda [name] works in tCharity fundraiser [place] The youngest, FPilar [name] is retired from the mOffice managerposition. He also owned his own business. The patient has 7 grandchildren and 2 many great-grandchildren to count. She attends the [employer]    ADVANCED DIRECTIVES: In place; the patient's daughter SFreda Munrois her healthcare power of attorney. She can be reached at [phone]  HEALTH MAINTENANCE: History  Substance Use Topics  . Smoking status: Never Smoker   . Smokeless tobacco: Never Used  . Alcohol Use: Yes     Comment: occ wine     Colonoscopy:  PAP:  Bone density:  Lipid panel:  Allergies  Allergen Reactions  . Adhesive [Tape]   . Penicillins Rash    Current Outpatient Prescriptions  Medication Sig Dispense Refill  . aspirin EC 81 MG tablet Take 1 tablet (81 mg total) by mouth daily.    . Calcium-Magnesium-Zinc 1000-400-15 MG TABS Take 1 tablet by mouth daily.      . celecoxib (CELEBREX) 200 MG capsule Take 1 capsule daily with food for pain & inflammation 90 capsule 0  . Cholecalciferol (VITAMIN D-3) 5000 UNITS TABS Take 1 tablet by mouth daily.     . diazepam (VALIUM) 5 MG tablet TAKE ONE TABLET BY MOUTH THREE TIMES DAILY AS NEEDED 90 tablet 0  . DULoxetine (CYMBALTA) 60 MG capsule Take 1 capsule by mouth  every day for mood 90 capsule 0  . ezetimibe (ZETIA) 10 MG tablet Take 10 mg by mouth  daily.    . Fish Oil OIL Take 100 mg by mouth 3 (three) times daily.    . Flaxseed, Linseed, (FLAX SEED OIL PO) Take 5 mLs by mouth daily. 1 tsp daily    . isosorbide mononitrate (IMDUR) 30 MG 24 hr tablet Take 1 tablet (30 mg total) by mouth daily.    [name] letrozole (FEMARA) 2.5 MG tablet Take 2.5 mg by mouth daily.    . montelukast (SINGULAIR) 10 MG tablet Take 1 tablet (10 mg total) by mouth at bedtime. 90 tablet 2  . Multiple Vitamins-Minerals (MULTIVITAMINS THER. W/MINERALS) TABS Take 1 tablet by mouth daily.      [name] albuterol (PROVENTIL HFA;VENTOLIN HFA) 108 (90 BASE) MCG/ACT inhaler Inhale into the lungs every 6 (six) hours as needed for wheezing or shortness of breath.    . budesonide-formoterol (SYMBICORT) 160-4.5 MCG/ACT inhaler Inhale 2 puffs into the lungs 2 (two) times daily as needed. (Patient not taking: Reported on 09/17/2014) 3 Inhaler 1  . nitroGLYCERIN (NITROSTAT) 0.4 MG SL tablet Place 1 tablet (0.4 mg total) under the tongue every 5 (five) minutes x 3 doses as needed for chest pain. (Patient not taking: Reported on 09/17/2014) 25 tablet 3  . palbociclib (IBRANCE) 125 MG capsule Take 1 capsule (125 mg total) by mouth daily with breakfast. Take whole with food. (Patient not taking: Reported on 08/28/2014) 21 capsule 0  . prochlorperazine (COMPAZINE) 10 MG tablet Take 1 tablet (10 mg total) by mouth every 6 (six) hours as needed for nausea or vomiting. 30 tablet 2   No current facility-administered medications for this visit.    OBJECTIVE: Older white woman who appears younger than stated age 42 Vitals:   09/17/14 1438  BP: 125/58  Pulse: 70  Temp: 97.8 F (36.6 C)  Resp: 18     Body mass index is 25.98 kg/(m^2).    ECOG FS:1 - Symptomatic but completely ambulatory  Skin: warm, dry  HEENT: sclerae anicteric, conjunctivae pink, oropharynx clear. No thrush or mucositis.  Lymph Nodes: No cervical or supraclavicular lymphadenopathy  Lungs: clear to auscultation bilaterally,  no rales, wheezes, or rhonci  Heart: regular rate and rhythm  Abdomen: round, soft, non tender, positive bowel sounds  Musculoskeletal: No focal spinal tenderness, no peripheral edema  Neuro: non focal, well oriented, positive affect  Breasts: Status post bilateral mastectomies. Deferred.  LAB RESULTS:  CMP     Component Value Date/Time   NA 140 08/21/2014 1459   NA 138 05/01/2014 1050   K 4.2 08/21/2014 1459   K 5.0 05/01/2014 1050   CL 102 05/01/2014 1050   CO2 28 08/21/2014 1459   CO2 27 05/01/2014 1050   GLUCOSE 116 08/21/2014 1459   GLUCOSE 104* 05/01/2014 1050   BUN 11.6 08/21/2014 1459   BUN 14 05/01/2014 1050   CREATININE 0.8 08/21/2014 1459   CREATININE 0.78 05/01/2014 1050   CREATININE 0.70 01/20/2014 0415   CALCIUM 10.5* 08/21/2014 1459   CALCIUM 9.4 05/01/2014 1050   PROT 6.8 08/21/2014 1459   PROT 6.1 05/01/2014 1050   ALBUMIN 4.0 08/21/2014 1459   ALBUMIN 4.0 05/01/2014 1050   AST 20 08/21/2014 1459   AST 16 05/01/2014 1050   ALT 14 08/21/2014 1459   ALT 11 05/01/2014 1050   ALKPHOS 56 08/21/2014 1459   ALKPHOS 45 05/01/2014 1050   BILITOT 0.26 08/21/2014 1459   BILITOT 0.5 05/01/2014 1050   GFRNONAA 73 05/01/2014 1050   GFRNONAA 80* 01/20/2014 0415   GFRAA 84 05/01/2014 1050   GFRAA >90 01/20/2014 0415    I No results found for: SPEP  Lab Results  Component Value Date   WBC 3.6* 09/17/2014   NEUTROABS 2.8 09/17/2014   HGB 12.4 09/17/2014   HCT 38.0 09/17/2014   MCV 92.8 09/17/2014   PLT 171 09/17/2014      Chemistry      Component Value Date/Time   NA 140 08/21/2014 1459   NA 138 05/01/2014 1050   K 4.2 08/21/2014 1459   K 5.0 05/01/2014 1050   CL 102 05/01/2014 1050   CO2 28 08/21/2014 1459   CO2 27 05/01/2014 1050   BUN 11.6 08/21/2014 1459   BUN 14 05/01/2014 1050   CREATININE 0.8 08/21/2014 1459   CREATININE 0.78 05/01/2014 1050   CREATININE 0.70 01/20/2014 0415      Component Value Date/Time   CALCIUM 10.5* 08/21/2014  1459   CALCIUM 9.4 05/01/2014 1050   ALKPHOS 56 08/21/2014 1459   ALKPHOS 45 05/01/2014 1050   AST 20 08/21/2014 1459   AST 16 05/01/2014 1050   ALT 14 08/21/2014 1459   ALT 11 05/01/2014 1050   BILITOT 0.26 08/21/2014 1459   BILITOT 0.5 05/01/2014 1050       Lab Results  Component Value Date   LABCA2 20 01/31/2014    No components found for: [id number]  No results for input(s): INR in the last 168 hours.  Urinalysis    Component Value Date/Time   COLORURINE YELLOW 09/05/2014 [place] 09/05/2014 1154   LABSPEC 1.006 09/05/2014 1154   PHURINE 6.5 09/05/2014 1154   GLUCOSEU NEG 09/05/2014 1154   HGBUR NEG 09/05/2014 1154   BILIRUBINUR NEG 09/05/2014 1154   KETONESUR NEG 09/05/2014 1154   PROTEINUR NEG 09/05/2014 1154   UROBILINOGEN 0.2 09/05/2014 1154   NITRITE NEG 09/05/2014 1154   LEUKOCYTESUR NEG 09/05/2014 1154    STUDIES: No results found.   ASSESSMENT: 80 y.o. [place] woman with locally recurrent breast cancer, as follows:  (1) status post right lumpectomy and sentinel node sampling September 2000 for ductal carcinoma in situ (Ni+), estrogen and progesterone receptor positive, with a low proliferation index,  (a) received 5040 cGy to the right breast and regional lymph nodes  (b) on tamoxifen less than 3 months  (2) s/p bilateral mastectomies and right axillary lymph node  sampling 07/29/2011, showing  (a) in the left breast, ductal carcinoma in situ measuring 1.2 cm, grade 1, with negative margins, estrogen and progesterone receptor positive  (b)  on the right, a pT1c pN0, stage IA invasive ductal carcinoma, estrogen receptor and 93% positive, progesterone receptor 87% positive, with an MIB-1 of 35%, and no HER-2 amplification margins were close but negative  (c) did not receive adjuvant antiestrogen therapy  (3) right chest wall skin recurrence resected 01/21/2014, measuring 1.7 cm, with positive margins, estrogen receptor 100% positive,  progesterone receptor 75% positive, with an MIB-1 of 80%, and no HER-2 amplification; PET scan 02/15/2014 negative except for Right chest wall focus  (a) received 45 Gy electron-beam therapy to the right chest wall completed 03/27/2014  (b) skin progression within and without the radiation field noted 04/30/2014  (c) letrozole started 05/13/2014  (d) to start palbociclib 08/27/2014  (4) dexa scan 05/09/2014 at [facility] shows osteoporosis with a T score of -2.9  PLAN: Ms .Kreiter is doing better today. The labs were reviewed in detail and were entirely stable. Her ANC is up to 2.8 today. We spent about 25 minutes discussing restarting the palbociclib, but with her worsening vision she prefers to have her cataract surgery and heal completely from that first, if she chooses to restart it at all. The surgery is scheduled for 10/02/14. She would like to continue on the letrozole daily and return to clinic in early February and I am agreeable to this plan. I have sent a prescription for compazine 62m Q6hrs PRN and we reviewed how to utilize this medication. She has been encouraged to call with any issues that might arise before her next visit here.  FMarcelino Duster NP   09/17/2014 3:06 PM

## 2014-09-18 ENCOUNTER — Other Ambulatory Visit: Payer: Self-pay | Admitting: Cardiology

## 2014-09-18 MED ORDER — ISOSORBIDE MONONITRATE ER 30 MG PO TB24: 30.0000 mg | ORAL_TABLET | Freq: Every day | ORAL | Status: DC

## 2014-09-23 ENCOUNTER — Other Ambulatory Visit: Payer: Self-pay | Admitting: Oncology

## 2014-09-24 ENCOUNTER — Other Ambulatory Visit: Payer: Medicare Other

## 2014-09-25 LAB — COLOGUARD: COLOGUARD: NEGATIVE

## 2014-09-27 DIAGNOSIS — C50919 Malignant neoplasm of unspecified site of unspecified female breast: Secondary | ICD-10-CM | POA: Insufficient documentation

## 2014-09-27 DIAGNOSIS — C7989 Secondary malignant neoplasm of other specified sites: Secondary | ICD-10-CM

## 2014-09-27 HISTORY — DX: Malignant neoplasm of unspecified site of unspecified female breast: C50.919

## 2014-10-01 ENCOUNTER — Other Ambulatory Visit: Payer: Medicare Other

## 2014-10-02 DIAGNOSIS — H35341 Macular cyst, hole, or pseudohole, right eye: Secondary | ICD-10-CM | POA: Diagnosis not present

## 2014-10-02 DIAGNOSIS — M549 Dorsalgia, unspecified: Secondary | ICD-10-CM | POA: Diagnosis not present

## 2014-10-02 DIAGNOSIS — I739 Peripheral vascular disease, unspecified: Secondary | ICD-10-CM | POA: Diagnosis not present

## 2014-10-02 DIAGNOSIS — R7309 Other abnormal glucose: Secondary | ICD-10-CM | POA: Diagnosis not present

## 2014-10-02 DIAGNOSIS — M1990 Unspecified osteoarthritis, unspecified site: Secondary | ICD-10-CM | POA: Diagnosis not present

## 2014-10-02 DIAGNOSIS — H35351 Cystoid macular degeneration, right eye: Secondary | ICD-10-CM | POA: Diagnosis not present

## 2014-10-02 DIAGNOSIS — H35371 Puckering of macula, right eye: Secondary | ICD-10-CM | POA: Diagnosis not present

## 2014-10-02 DIAGNOSIS — I10 Essential (primary) hypertension: Secondary | ICD-10-CM | POA: Diagnosis not present

## 2014-10-02 DIAGNOSIS — F329 Major depressive disorder, single episode, unspecified: Secondary | ICD-10-CM | POA: Diagnosis not present

## 2014-10-02 DIAGNOSIS — C50919 Malignant neoplasm of unspecified site of unspecified female breast: Secondary | ICD-10-CM | POA: Diagnosis not present

## 2014-10-02 DIAGNOSIS — H33101 Unspecified retinoschisis, right eye: Secondary | ICD-10-CM | POA: Diagnosis not present

## 2014-10-02 DIAGNOSIS — J42 Unspecified chronic bronchitis: Secondary | ICD-10-CM | POA: Diagnosis not present

## 2014-10-02 DIAGNOSIS — I2510 Atherosclerotic heart disease of native coronary artery without angina pectoris: Secondary | ICD-10-CM | POA: Diagnosis not present

## 2014-10-02 DIAGNOSIS — F419 Anxiety disorder, unspecified: Secondary | ICD-10-CM | POA: Diagnosis not present

## 2014-10-02 DIAGNOSIS — Z9013 Acquired absence of bilateral breasts and nipples: Secondary | ICD-10-CM | POA: Diagnosis not present

## 2014-10-02 DIAGNOSIS — J45909 Unspecified asthma, uncomplicated: Secondary | ICD-10-CM | POA: Diagnosis not present

## 2014-10-02 DIAGNOSIS — E785 Hyperlipidemia, unspecified: Secondary | ICD-10-CM | POA: Diagnosis not present

## 2014-10-02 DIAGNOSIS — D696 Thrombocytopenia, unspecified: Secondary | ICD-10-CM | POA: Diagnosis not present

## 2014-10-03 DIAGNOSIS — Z9889 Other specified postprocedural states: Secondary | ICD-10-CM | POA: Diagnosis not present

## 2014-10-03 DIAGNOSIS — H35371 Puckering of macula, right eye: Secondary | ICD-10-CM | POA: Diagnosis not present

## 2014-10-03 DIAGNOSIS — H35341 Macular cyst, hole, or pseudohole, right eye: Secondary | ICD-10-CM | POA: Diagnosis not present

## 2014-10-05 ENCOUNTER — Encounter: Payer: Self-pay | Admitting: *Deleted

## 2014-10-08 ENCOUNTER — Other Ambulatory Visit: Payer: Medicare Other

## 2014-10-10 ENCOUNTER — Encounter (HOSPITAL_BASED_OUTPATIENT_CLINIC_OR_DEPARTMENT_OTHER): Payer: Self-pay | Admitting: Surgery

## 2014-10-23 ENCOUNTER — Other Ambulatory Visit: Payer: Self-pay | Admitting: Physician Assistant

## 2014-10-23 ENCOUNTER — Telehealth: Payer: Self-pay | Admitting: *Deleted

## 2014-10-23 ENCOUNTER — Other Ambulatory Visit: Payer: Self-pay | Admitting: Oncology

## 2014-10-23 ENCOUNTER — Encounter: Payer: Self-pay | Admitting: *Deleted

## 2014-10-23 DIAGNOSIS — F411 Generalized anxiety disorder: Secondary | ICD-10-CM

## 2014-10-23 DIAGNOSIS — C50911 Malignant neoplasm of unspecified site of right female breast: Secondary | ICD-10-CM

## 2014-10-23 NOTE — Telephone Encounter (Signed)
Pre-Visit Call:   Health history and allergies reviewed with patient and changes made as necessay.   Patient will bring in list of medications and doses (does not want to bring bottles) to appointment   PAP- none found [facility]- 08/30/2013- negative, Cologard done 09/23/14- negative [facility]- 06/10/11- BI Rads Category 4- biopsy revealed carcinoma, had bilateral mastectomy on 07/29/11.  2015 carcinoma found in scar tissue above right breast, so patient currently taking Letrozole and is following with Oncology.   BD- 05/09/14 at [facility]- Osteoporosis.   Flu- 06/27/14 UTD Td- 09/27/08    UTD Pna- 07/18/09 (Pna 23) would like Prevnar  Zoster- 07/09/14 UTD  Pharmacy Preference: [place] [place] [facility] No questions or concerns at this time.

## 2014-10-24 ENCOUNTER — Encounter: Payer: Self-pay | Admitting: Family Medicine

## 2014-10-24 ENCOUNTER — Ambulatory Visit (INDEPENDENT_AMBULATORY_CARE_PROVIDER_SITE_OTHER): Payer: Medicare Other | Admitting: Family Medicine

## 2014-10-24 VITALS — BP 120/72 | HR 89 | Temp 97.6°F | Ht 61.5 in | Wt 139.6 lb

## 2014-10-24 DIAGNOSIS — I25119 Atherosclerotic heart disease of native coronary artery with unspecified angina pectoris: Secondary | ICD-10-CM

## 2014-10-24 DIAGNOSIS — D229 Melanocytic nevi, unspecified: Secondary | ICD-10-CM | POA: Diagnosis not present

## 2014-10-24 NOTE — Progress Notes (Signed)
Pre visit review using our clinic review tool, if applicable. No additional management support is needed unless otherwise documented below in the visit note. 

## 2014-10-24 NOTE — Patient Instructions (Signed)

## 2014-10-24 NOTE — Assessment & Plan Note (Signed)
Pt sees Dr Radford Pax Pt brought in labs from 12/ 2015

## 2014-10-24 NOTE — Progress Notes (Signed)
Subjective:    Angel [name] is a 81 y.o. female here for follow up of dyslipidemia. The patient does not use medications that may worsen dyslipidemias (corticosteroids, progestins, anabolic steroids, diuretics, beta-blockers, amiodarone, cyclosporine, olanzapine). The patient exercises intermittently. The patient is known to have coexisting coronary artery disease.  Pt also c/o suspicious lesion on L collar bone-- she will go to dermatology. Pt also hx cad-- sees cardiology regularly She also sees Dr Jana Hakim --  Hx breast / chest wall ca  Cardiac Risk Factors Age > 45-female, > 55-female:  YES  +1  Smoking:   NO  Sig. family hx of CHD*:  YES  +1  Hypertension:   NO  Diabetes:   NO  HDL < 35:   NO  HDL > 59:   NO  Total: 2   *- Sig. family h/o CHD per NCEP = MI or sudden death at <55yo in  father or other 1st-degree female relative, or <65yo in mother or  other 1st-degree female relative  The following portions of the patient's history were reviewed and updated as appropriate:  She  has a past medical history of Asthma; Hyperlipemia; Spinal stenosis; Osteoporosis; Chronic bronchitis; DDD (degenerative disc disease); Osteoarthritis; Degenerative joint disease; Skin cancer of face; Coronary artery disease (Non-obstructive); Depression; Anxiety; COPD (chronic obstructive pulmonary disease); Dyslipidemia; Cholelithiasis; GERD (gastroesophageal reflux disease); radiation therapy (07/02/1999- 08/11/1999); Breast cancer (05/07/1999, 06/2011); radiation therapy (02/20/14- 03/27/14); Hypertension; Kidney stones; and Cataract. She  does not have any pertinent problems on file. She  has past surgical history that includes Vesicovaginal fistula closure w/ TAH (age 36); Tubal ligation (1961); Appendectomy; Abdominal hysterectomy; Small intestine surgery (1998); Tonsillectomy; Breast lumpectomy (2002); Mastectomy (07/29/11); Cardiac catheterization (03/2011, 3/15); Breast biopsy (Right, 01/21/2014); Vein  ligation and stripping (Right); and left heart catheterization with coronary angiogram (N/A, 12/05/2013). Her family history includes Asthma in her brother, brother, daughter, and grandchild; Cancer in her mother; Emphysema in her father; Heart disease in her brother and father; Lymphoma in her brother; Pancreatic cancer in her mother; Stroke in her daughter. She  reports that she has never smoked. She has never used smokeless tobacco. She reports that she drinks alcohol. She reports that she does not use illicit drugs. She has a current medication list which includes the following prescription(s): albuterol, aspirin ec, b complex-biotin-fa, biotin, budesonide-formoterol, calcium-magnesium-zinc, celecoxib, vitamin d-3, diazepam, duloxetine, ezetimibe, fish oil, flaxseed (linseed), isosorbide mononitrate, letrozole, misc natural products, montelukast, multivitamin, multivitamins ther. w/minerals, nitroglycerin, prochlorperazine, and palbociclib. Current Outpatient Prescriptions on File Prior to Visit  Medication Sig Dispense Refill  . albuterol (PROVENTIL HFA;VENTOLIN HFA) 108 (90 BASE) MCG/ACT inhaler Inhale into the lungs every 6 (six) hours as needed for wheezing or shortness of breath.    [name] aspirin EC 81 MG tablet Take 1 tablet (81 mg total) by mouth daily.    . Biotin 5000 MCG CAPS Take 5,000 mcg by mouth daily.    . budesonide-formoterol (SYMBICORT) 160-4.5 MCG/ACT inhaler Inhale 2 puffs into the lungs 2 (two) times daily as needed. 3 Inhaler 1  . Calcium-Magnesium-Zinc 1000-400-15 MG TABS Take 1 tablet by mouth daily.      . celecoxib (CELEBREX) 200 MG capsule Take 1 capsule daily with food for pain & inflammation 90 capsule 0  . Cholecalciferol (VITAMIN D-3) 5000 UNITS TABS Take 1 tablet by mouth daily.     . diazepam (VALIUM) 5 MG tablet Take 1/2 to 1 tablet 3 x day if needed for Anxiety - need Office visit  before further refills 30 tablet 0  . DULoxetine (CYMBALTA) 60 MG capsule Take 1 capsule  by mouth  every day for mood 90 capsule 0  . ezetimibe (ZETIA) 10 MG tablet Take 10 mg by mouth daily.    . Fish Oil OIL Take 100 mg by mouth 3 (three) times daily.    . Flaxseed, Linseed, (FLAX SEED OIL PO) Take 5 mLs by mouth daily. 1 tsp daily    . isosorbide mononitrate (IMDUR) 30 MG 24 hr tablet Take 1 tablet (30 mg total) by mouth daily. 30 tablet 3  . letrozole (FEMARA) 2.5 MG tablet Take 1 tablet by mouth  daily 90 tablet 1  . montelukast (SINGULAIR) 10 MG tablet Take 1 tablet (10 mg total) by mouth at bedtime. 90 tablet 2  . Multiple Vitamins-Minerals (MULTIVITAMINS THER. W/MINERALS) TABS Take 1 tablet by mouth daily.      . nitroGLYCERIN (NITROSTAT) 0.4 MG SL tablet Place 1 tablet (0.4 mg total) under the tongue every 5 (five) minutes x 3 doses as needed for chest pain. 25 tablet 3  . prochlorperazine (COMPAZINE) 10 MG tablet Take 1 tablet (10 mg total) by mouth every 6 (six) hours as needed for nausea or vomiting. 30 tablet 2  . palbociclib (IBRANCE) 125 MG capsule Take 1 capsule (125 mg total) by mouth daily with breakfast. Take whole with food. (Patient not taking: Reported on 10/24/2014) 21 capsule 0   No current facility-administered medications on file prior to visit.   She is allergic to adhesive and penicillins..  Review of Systems Review of Systems  Constitutional: Negative for activity change, appetite change and fatigue.  HENT: Negative for hearing loss, congestion, tinnitus and ear discharge.  dentist q64m Eyes: Negative for visual disturbance (see optho q1y -- vision corrected to 20/20 with glasses).  Respiratory: Negative for cough, chest tightness and shortness of breath.   Cardiovascular: Negative for chest pain, palpitations and leg swelling.  Gastrointestinal: Negative for abdominal pain, diarrhea, constipation and abdominal distention.  Genitourinary: Negative for urgency, frequency, decreased urine volume and difficulty urinating.  Musculoskeletal: Negative for  back pain, arthralgias and gait problem.  Skin: + suspicious mole L collar bone Neurological: Negative for dizziness, light-headedness, numbness and headaches.  Hematological: Negative for adenopathy. Does not bruise/bleed easily.  Psychiatric/Behavioral: Negative for suicidal ideas, confusion, sleep disturbance, self-injury, dysphoric mood, decreased concentration and agitation.        Objective:    BP 120/72 mmHg  Pulse 89  Temp(Src) 97.6 F (36.4 C) (Oral)  Ht 5' 1.5" (1.562 m)  Wt 139 lb 9.6 oz (63.322 kg)  BMI 25.95 kg/m2  SpO2 95% General appearance: alert, cooperative, appears stated age and no distress Throat: lips, mucosa, and tongue normal; teeth and gums normal Neck: no adenopathy, no carotid bruit, no JVD, supple, symmetrical, trachea midline and thyroid not enlarged, symmetric, no tenderness/mass/nodules Lungs: clear to auscultation bilaterally Heart: S1, S2 normal Extremities: extremities normal, atraumatic, no cyanosis or edema  Lab Review Lab Results  Component Value Date   CHOL 159 09/06/2014   CHOL 204* 05/01/2014   CHOL 181 01/18/2014   HDL 60 09/06/2014   HDL 52 05/01/2014   HDL 46 01/18/2014      Assessment:    Dyslipidemia as detailed above with 6 CHD risk factors using NCEP scheme above.  Target levels for LDL are: < 100 mg/dl (CHD or "CHD risk equivalent" is present)  Explained to the patient the respective contributions of genetics, diet, and exercise to  lipid levels and the use of medication in severe cases which do not respond to lifestyle alteration. The patient's interest and motivation in making lifestyle changes seems excellent.    Plan:    The following changes are planned for the next 6 months, at which time the patient will return for repeat fasting lipids:  1. Dietary changes: Increase soluble fiber 2. Exercise changes:  exercise as tolerated 3. Other treatment: None 4. Lipid-lowering medications: cont zetia  (Recommended by  NCEP after 3-6 mos of dietary therapy & lifestyle modification,  except if CHD is present or LDL well above 190.) 5. Hormone replacement therapy (patient is a postmenopausal  woman): no 6. Screening for secondary causes of dyslipidemias: None indicated 7. Lipid screening for relatives: na 8. Follow up: 6 months.  Note: The majority of the visit was spent in counseling on the pathophysiology and treatment of dyslipidemias. The total face-to-face time was in excess of 30 minutes.

## 2014-10-25 ENCOUNTER — Other Ambulatory Visit: Payer: Self-pay

## 2014-10-25 MED ORDER — ISOSORBIDE MONONITRATE ER 30 MG PO TB24
30.0000 mg | ORAL_TABLET | Freq: Every day | ORAL | Status: DC
Start: 2014-10-25 — End: 2015-04-11

## 2014-10-31 ENCOUNTER — Telehealth: Payer: Self-pay | Admitting: Cardiology

## 2014-10-31 NOTE — Telephone Encounter (Signed)
Patient st she has had chest pain nearly every day after activity for the last couple weeks. She st it lasts about 2-3 hours and she becomes SOB as well. No c/o chest pain now, but patient st she has not done any activity.  Patient st she is not sure if pain is coming from cancer or her heart, but if it's her heart she wants a stent so she can be stronger to fight the cancer. She st nitro helps a little, but the nauseating headache it causes is excruciating. She has taken Tylenol and she says it helps a little.   Scheduled appointment with Melina Copa in the morning.  Instructed patient to go to call 911 if she experiences another episode.

## 2014-10-31 NOTE — Telephone Encounter (Signed)
Pt c/o of Chest Pain: STAT if CP now or developed within 24 hours  1. Are you having CP right now? No  2. Are you experiencing any other symptoms (ex. SOB, nausea, vomiting, sweating)? SOB, Headaches from the medicine  3. How long have you been experiencing CP? Everyday until she rests. It always starts about 3 hours after any activities  4. Is your CP continuous or coming and going? All day  5. Have you taken Nitroglycerin? Yes... And that is what gives her headaches and makes her nauseous.  ?

## 2014-11-01 ENCOUNTER — Ambulatory Visit (INDEPENDENT_AMBULATORY_CARE_PROVIDER_SITE_OTHER): Payer: Medicare Other | Admitting: Physician Assistant

## 2014-11-01 ENCOUNTER — Encounter: Payer: Self-pay | Admitting: Physician Assistant

## 2014-11-01 VITALS — BP 124/64 | HR 70 | Ht 62.5 in | Wt 137.0 lb

## 2014-11-01 DIAGNOSIS — I25119 Atherosclerotic heart disease of native coronary artery with unspecified angina pectoris: Secondary | ICD-10-CM | POA: Diagnosis not present

## 2014-11-01 DIAGNOSIS — E785 Hyperlipidemia, unspecified: Secondary | ICD-10-CM | POA: Diagnosis not present

## 2014-11-01 DIAGNOSIS — I10 Essential (primary) hypertension: Secondary | ICD-10-CM | POA: Diagnosis not present

## 2014-11-01 DIAGNOSIS — R079 Chest pain, unspecified: Secondary | ICD-10-CM | POA: Insufficient documentation

## 2014-11-01 DIAGNOSIS — K2100 Gastro-esophageal reflux disease with esophagitis, without bleeding: Secondary | ICD-10-CM

## 2014-11-01 DIAGNOSIS — I493 Ventricular premature depolarization: Secondary | ICD-10-CM

## 2014-11-01 LAB — COMPREHENSIVE METABOLIC PANEL
ALT: 11 U/L (ref 0–35)
AST: 18 U/L (ref 0–37)
Albumin: 4 g/dL (ref 3.5–5.2)
Alkaline Phosphatase: 40 U/L (ref 39–117)
BUN: 14 mg/dL (ref 6–23)
CALCIUM: 9.6 mg/dL (ref 8.4–10.5)
CHLORIDE: 102 meq/L (ref 96–112)
CO2: 30 mEq/L (ref 19–32)
Creatinine, Ser: 0.7 mg/dL (ref 0.40–1.20)
GFR: 85.52 mL/min (ref >60.00)
Glucose, Bld: 87 mg/dL (ref 70–99)
POTASSIUM: 4.1 meq/L (ref 3.5–5.1)
Sodium: 137 mEq/L (ref 135–145)
Total Bilirubin: 0.5 mg/dL (ref 0.2–1.2)
Total Protein: 6.6 g/dL (ref 6.0–8.3)

## 2014-11-01 LAB — CBC
HCT: 38.7 % (ref 36.0–46.0)
Hemoglobin: 12.9 g/dL (ref 12.0–15.0)
MCHC: 33.3 g/dL (ref 30.0–36.0)
MCV: 92.6 fl (ref 78.0–100.0)
Platelets: 229 10*3/uL (ref 150.0–400.0)
RBC: 4.19 Mil/uL (ref 3.87–5.11)
RDW: 14.9 % (ref 11.5–15.5)
WBC: 6.1 10*3/uL (ref 4.0–10.5)

## 2014-11-01 LAB — PROTIME-INR
INR: 1 ratio (ref 0.8–1.0)
Prothrombin Time: 10.6 s (ref 9.6–13.1)

## 2014-11-01 NOTE — Patient Instructions (Signed)
Your physician recommends that you continue on your current medications as directed. Please refer to the Current Medication list given to you today.  LABS TODAY CBC CMET PT/INR    \Your physician has requested that you have a cardiac catheterization. Cardiac catheterization is used to diagnose and/or treat various heart conditions. Doctors may recommend this procedure for a number of different reasons. The most common reason is to evaluate chest pain. Chest pain can be a symptom of coronary artery disease (CAD), and cardiac catheterization can show whether plaque is narrowing or blocking your heart's arteries. This procedure is also used to evaluate the valves, as well as measure the blood flow and oxygen levels in different parts of your heart. For further information please visit [email or web address]. Please follow instruction sheet, as given.   YOU HAVE BEEN SCHEDULE FOR A LEFT HEART CATH ON 11/06/14  WITH DR Tamala Julian @ 10 AM PLEASE ARRIVE AT [facility] @ 8 AM   PLEASE MAKE SURE YOU HAVE HAS NOTHING TO EAT OR DRINK AFTER MIDNIGHT ON 11/05/14 BUT WATER  YOU MAY  HAVE YOUR MEDS  WITH SOME WATER  THE MORNING OF  11/06/14   FOLLOW UP TURNER OR AVAILABLE APP 2 WEEKS POST CATH

## 2014-11-01 NOTE — Progress Notes (Signed)
Cardiology Office Note Date:  11/01/2014  Patient ID:  Angel, [name] [birth year], MRN [id number] PCP:  [name], DO  Cardiologist:  Dr. Radford Pax    Chief Complaint: chest pain  History of Present Illness: Angel [name] is a 81 y.o. female with history of non-obstructive CAD (2012: mid LAD 40-50% stenosis), DDD, dyslipidemia, breast CA (tx with mastectomy, radiation, meds - followed by onc), remote asthma, GERD, reflux esophagitis, PVCs, pre-DM, and depression who presents for evaluation of chest pain. Last cardiac studies include 2D echo 11/2013: EF 50-55%, no RWMA, atrial septal aneurysm without shunt, normal diastolic function. Nuc 12/2013: no evidence of ischemia, normal cardiac function, EF 74%, mildly decreased radiotracer uptake in the ventricular septum on all images suggest the possibility of a region of prior infarct/scar versus BBB. Per Dr. Theodosia Blender note, she has been on several antianginal meds.Ranolazine was added to her medical regimen but she had to stop it after a few days due to severe fatigue. She also had significant side effects to metoprolol with fatigue and lethargy and it was stopped.Hgb normal, LDL 86 in 08/2014.  She has been having occasional chest discomfort, prompting today's visit. She is concerned it is coming from her heart. It doesn't happen every day, but is happening more frequently. It tends to occur about 3 hours into physical activity (not exercise, just regular activity). During those initial 3 hours she feels fine, but after 3 hours she begins to notice chest pressure. She stops and rests and the pain resolves within 2-3 hours. She says after that, "I'm spent for the next day or so." She has also had some SOB, dizziness, and random diaphoresis at times. She has occasional palpitations that she perceives as her PVCs. No syncope, bleeding, orthopnea. She has occasional trace LEE which has improved with limiting salt intake. The other night she had  an episode of pain that woke her up in the middle of the night. It did not change with NTG. In fact, NTG generally makes her feel worse with nausea and headache. Her chest pain is generally relieved with Vicodin which she takes regularly PRN. It is similar to what prompted eval in 12/2013 with normal stress test. She denies chest pain currently.  Past Medical History  Diagnosis Date  . Asthma   . Spinal stenosis   . Osteoporosis   . Chronic bronchitis   . DDD (degenerative disc disease)   . Osteoarthritis   . Degenerative joint disease   . Skin cancer of face     non melanoma  . Coronary artery disease Non-obstructive    a. 2012 Cath: LAD 40-83m->Med Rx;  b. 11/2013 Echo: EF 50-55%, nl wall motion;  c. 12/2013 Lexi MV: EF 74%, no ischemia.  . Depression   . Anxiety   . COPD (chronic obstructive pulmonary disease)   . Dyslipidemia   . Cholelithiasis   . GERD (gastroesophageal reflux disease)   . Hx of radiation therapy 07/02/1999- 08/11/1999    right supraclavucular/axillary region: 5040 cGy, 28 fractions  . Breast cancer 05/07/1999, 06/2011    a. s/p bilat mastectomies. recurrence 01/2014. Radiation, meds.  [name] Hx of radiation therapy 02/20/14- 03/27/14    right chest wall 4500 cGy 25 sessions  . Hypertension   . Kidney stones     one stones  . Cataract     eye surgery planned for 11/2014  . PVC's (premature ventricular contractions)   . Reflux esophagitis   . Pre-diabetes  Past Surgical History  Procedure Laterality Date  . Vesicovaginal fistula closure w/ tah  age 58  . Tubal ligation  1961  . Appendectomy    . Abdominal hysterectomy      in her 30's  . Small intestine surgery  1998    SBO  . Tonsillectomy    . Breast lumpectomy  2002    right breast  . Mastectomy  07/29/11    bilateral-rt nodes-none on left  . Cardiac catheterization  03/2011, 3/15  . Breast biopsy Right 01/21/2014    Procedure: BREAST/CHEST WALL BIOPSY;  Surgeon: [name], MD;  Location: [facility];   Service: General;  Laterality: Right;  . Vein ligation and stripping Right   . Left heart catheterization with coronary angiogram N/A 12/05/2013    Procedure: LEFT HEART CATHETERIZATION WITH CORONARY ANGIOGRAM;  Surgeon: [name], MD;  Location: [facility] CATH LAB;  Service: Cardiovascular;  Laterality: N/A;    Current Outpatient Prescriptions  Medication Sig Dispense Refill  . HYDROcodone-acetaminophen (NORCO) 10-325 MG per tablet Take 1 tablet by mouth every 6 (six) hours as needed.    . raloxifene (EVISTA) 60 MG tablet Take 60 mg by mouth at bedtime.    [name] albuterol (PROVENTIL HFA;VENTOLIN HFA) 108 (90 BASE) MCG/ACT inhaler Inhale into the lungs every 6 (six) hours as needed for wheezing or shortness of breath.    [name] aspirin EC 81 MG tablet Take 1 tablet (81 mg total) by mouth daily.    . B Complex-Biotin-FA (B-COMPLEX PO) Take by mouth.    . Biotin 5000 MCG CAPS Take 5,000 mcg by mouth daily.    . budesonide-formoterol (SYMBICORT) 160-4.5 MCG/ACT inhaler Inhale 2 puffs into the lungs 2 (two) times daily as needed. 3 Inhaler 1  . Calcium-Magnesium-Zinc 1000-400-15 MG TABS Take 1 tablet by mouth daily.      . celecoxib (CELEBREX) 200 MG capsule Take 1 capsule daily with food for pain & inflammation 90 capsule 0  . Cholecalciferol (VITAMIN D-3) 5000 UNITS TABS Take 1 tablet by mouth daily.     . diazepam (VALIUM) 5 MG tablet Take 1/2 to 1 tablet 3 x day if needed for Anxiety - need Office visit before further refills 30 tablet 0  . DULoxetine (CYMBALTA) 60 MG capsule Take 1 capsule by mouth  every day for mood 90 capsule 0  . ezetimibe (ZETIA) 10 MG tablet Take 10 mg by mouth daily.    . Fish Oil OIL Take 100 mg by mouth 3 (three) times daily.    . Flaxseed, Linseed, (FLAX SEED OIL PO) Take 5 mLs by mouth daily. 1 tsp daily    . isosorbide mononitrate (IMDUR) 30 MG 24 hr tablet Take 1 tablet (30 mg total) by mouth daily. 30 tablet 3  . letrozole (FEMARA) 2.5 MG tablet Take 1 tablet by  mouth  daily 90 tablet 1  . Misc Natural Products (GLUCOSAMINE CHOND COMPLEX/MSM PO) Place inside cheek.    . montelukast (SINGULAIR) 10 MG tablet Take 1 tablet (10 mg total) by mouth at bedtime. 90 tablet 2  . Multiple Vitamin (MULTIVITAMIN) tablet Take 1 tablet by mouth daily.    . nitroGLYCERIN (NITROSTAT) 0.4 MG SL tablet Place 1 tablet (0.4 mg total) under the tongue every 5 (five) minutes x 3 doses as needed for chest pain. 25 tablet 3   No current facility-administered medications for this visit.    Allergies:   Penicillins and Adhesive   Social History:  The patient  reports that she has never smoked. She has never used smokeless tobacco. She reports that she drinks alcohol. She reports that she does not use illicit drugs.   Family History:  The patient's family history includes Asthma in her brother, brother, daughter, and grandchild; Cancer in her mother; Emphysema in her father; Heart disease in her brother and father; Lymphoma in her brother; Pancreatic cancer in her mother; Stroke in her daughter.   ROS:  Please see the history of present illness.  All other systems are reviewed and otherwise negative.   PHYSICAL EXAM:  VS:  BP 124/64 mmHg  Pulse 70  Ht 5' 2.5" (1.588 m)  Wt 137 lb (62.143 kg)  BMI 24.64 kg/m2 BMI: Body mass index is 24.64 kg/(m^2). 97% RA. Well nourished, well developed WF, in no acute distress HEENT: normocephalic, atraumatic Neck: no JVD Cardiac:  normal S1, S2; RRR; no murmur Lungs:  clear to auscultation bilaterally, no wheezing, rhonchi or rales Abd: soft, nontender, no hepatomegaly Ext: no edema Skin: warm and dry Neuro:  moves all extremities spontaneously, no focal abnormalities noted  EKG:  NSR 70bpm, TWI avL, no acute ST-T changes  Recent Labs: 01/17/2014: Pro B Natriuretic peptide (BNP) 163.6 08/21/2014: ALT 14; BUN 11.6; Creatinine 0.8; Potassium 4.2; Sodium 140 09/06/2014: Magnesium 1.9; TSH 3.234 09/17/2014: Hemoglobin 12.4;  Platelets 171  09/06/2014: Cholesterol, Total 159; HDL Cholesterol by NMR 60; LDL (calc) 86; Total CHOL/HDL Ratio 2.7; Triglycerides 67; VLDL 13   CrCl cannot be calculated (Patient has no serum creatinine result on file.).   Wt Readings from Last 3 Encounters:  11/01/14 137 lb (62.143 kg)  10/24/14 139 lb 9.6 oz (63.322 kg)  09/17/14 142 lb 1.6 oz (64.456 kg)     Other studies reviewed: Additional studies/records reviewed today include: nuc, echo as above.  ASSESSMENT AND PLAN:  1. Chest pain, unspecified - some typical and atypical features. H/o moderate CAD in 2012 but normal EKG today. We discussed stress testing versus cardiac catheterization. The patient is adamant that she wants to make sure her heart is strong enough to fight her cancer. She wants to know 100% for sure the status of her prior heart blockage. She requests to proceed with cardiac catheterization. Risks and benefits of cardiac catheterization have been discussed with the patient.  These include bleeding, infection, kidney damage, stroke, heart attack, death. The patient understands these risks and is willing to proceed. Continue ASA, Imdur.  Intolerant of BB and statins in the past. PE felt less likely given not tachycardic, tachypnic or hypoxic. She ambulated extensively in the officeat 97% oxygen saturation on room air. She has no upcoming surgical procedures planned. heck pre-cath labs. Also d/w Dr. Radford Pax. ER precautions reviewed.  2. CAD (nonobstructive in 2012) - see above.  3. Essential HTN - controlled.  4. Mixed HLD - reportedly intolerant to statins in the past. LDL 86 in 08/2014. 5. H/o GERD - if above workup unrevealing would consider referral to GI. 6. H/o PVCs - quiescent by today's EKG.  Disposition: F/u with Dr. Radford Pax or APP 2 weeks post-cath.  Current medicines are reviewed at length with the patient today.  The patient did not have any concerns regarding medicines.  Angel Ache  PA-C 11/01/2014 10:33 AM     [facility] [address] [place] [address] [phone] (office)  [phone] (fax)

## 2014-11-05 ENCOUNTER — Other Ambulatory Visit (HOSPITAL_BASED_OUTPATIENT_CLINIC_OR_DEPARTMENT_OTHER): Payer: Medicare Other

## 2014-11-05 ENCOUNTER — Encounter: Payer: Self-pay | Admitting: Oncology

## 2014-11-05 ENCOUNTER — Ambulatory Visit (HOSPITAL_BASED_OUTPATIENT_CLINIC_OR_DEPARTMENT_OTHER): Payer: Medicare Other | Admitting: Oncology

## 2014-11-05 ENCOUNTER — Telehealth: Payer: Self-pay | Admitting: Oncology

## 2014-11-05 VITALS — BP 109/69 | HR 84 | Temp 97.2°F | Resp 18 | Ht 62.5 in | Wt 136.1 lb

## 2014-11-05 DIAGNOSIS — Z853 Personal history of malignant neoplasm of breast: Secondary | ICD-10-CM

## 2014-11-05 DIAGNOSIS — C7989 Secondary malignant neoplasm of other specified sites: Secondary | ICD-10-CM | POA: Diagnosis not present

## 2014-11-05 DIAGNOSIS — G8929 Other chronic pain: Secondary | ICD-10-CM | POA: Diagnosis not present

## 2014-11-05 DIAGNOSIS — Z9010 Acquired absence of unspecified breast and nipple: Secondary | ICD-10-CM | POA: Diagnosis not present

## 2014-11-05 DIAGNOSIS — C50911 Malignant neoplasm of unspecified site of right female breast: Secondary | ICD-10-CM

## 2014-11-05 DIAGNOSIS — M810 Age-related osteoporosis without current pathological fracture: Secondary | ICD-10-CM | POA: Diagnosis not present

## 2014-11-05 LAB — CBC WITH DIFFERENTIAL/PLATELET
BASO%: 0.6 % (ref 0.0–2.0)
BASOS ABS: 0 10*3/uL (ref 0.0–0.1)
EOS ABS: 0.1 10*3/uL (ref 0.0–0.5)
EOS%: 1.1 % (ref 0.0–7.0)
HCT: 40.4 % (ref 34.8–46.6)
HGB: 13.3 g/dL (ref 11.6–15.9)
LYMPH#: 2.7 10*3/uL (ref 0.9–3.3)
LYMPH%: 40.5 % (ref 14.0–49.7)
MCH: 31.4 pg (ref 25.1–34.0)
MCHC: 32.9 g/dL (ref 31.5–36.0)
MCV: 95.5 fL (ref 79.5–101.0)
MONO#: 0.7 10*3/uL (ref 0.1–0.9)
MONO%: 10.8 % (ref 0.0–14.0)
NEUT%: 47 % (ref 38.4–76.8)
NEUTROS ABS: 3.1 10*3/uL (ref 1.5–6.5)
PLATELETS: 225 10*3/uL (ref 145–400)
RBC: 4.23 10*6/uL (ref 3.70–5.45)
RDW: 13.8 % (ref 11.2–14.5)
WBC: 6.6 10*3/uL (ref 3.9–10.3)

## 2014-11-05 NOTE — Progress Notes (Signed)
Pt came in with concerns about her bills.  I informed her of the [employer] and discussed what it will and will not cover.  I also informed her of the [employer] card and went over what it entails and gave her the number to pt accounting to apply for the card.  Pt will bring in her social security letter or bank statement to see if she will qualify for the grant.

## 2014-11-05 NOTE — Addendum Note (Signed)
Addended by: Laureen Abrahams on: 11/05/2014 04:50 PM   Modules accepted: Orders, Medications

## 2014-11-05 NOTE — Progress Notes (Signed)
[place]  [phone] [phone]     ID: Angel [name] DOB: [birth year]  MR#: [id number]  GUR#:[id number]  Patient Care Team: Angel Chessman, DO as PCP - General (Family Medicine) Angel Cruel, MD as Consulting Physician (Oncology) Angel Edison, MD as Consulting Physician (Radiation Oncology) Angel Margarita, MD as Consulting Physician (Cardiology) OTHER M.D.: Angel [name] (ophth)  CHIEF COMPLAINT: Chest wall recurrence of breast cancer CURRENT TREATMENT: Letrozole  BREAST CANCER HISTORY: Per Angel. 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 She had a second right breast cancer in 2012, with right mastectomy by Angel [name] 07-29-2011 for T1N0M0 ER/PR positive, HER-2 negative invasive ductal carcinoma; she also had what was planned as prophylactic left mastectomy, with unexpected finding of left DCIS in that pathology. Plan in 09-2011 was to begin tamoxifen, chosen particularly due to low bone density. Patient did not keep follow up visits at this office, stopped tamoxifen due intolerance (tho she does not remember what problem she had with this) and was treated with raloxifene by Angel [name].  She was seen next at this office on 12-26-2013 with an enlarging nodular area above right mastectomy scar,reportedly present for several months. Excisional biopsy done 01-21-14 found grade 2 invasive ductal carcinoma with involvement at [facility]) and lymphovascular space invasion, ER positive 100%, PR positive 75%, proliferation marker 80% and HER-2 negative by [facility] (pathology [phone]). PET 02-15-14 did not identify any involvement beyond right chest wall. She had 4500 cGy as electron beam radiation from 5-27 thru 03-27-14 to right chest wall. By 04-30-14 there were multiple tiny nodules in region of right mastectomy scar."  The patient's  subsequent history is as detailed below  INTERVAL HISTORY: Angel [name] returns today for follow up of her recurrent breast cancer. We have been trying to start her on Palbociclib, but she tells me she had a dream that told her her heart was a real problem not a breast cancer, and she is being evaluated through Angel. Theodosia [name] office with a cath planned for tomorrow. In addition she is scheduled for further right eye surgery sometime in the next few weeks at [facility]. Accordingly the Palbociclib is going to have to be postponed  REVIEW OF SYSTEMS: Angel [name] is tolerating the letrozole with no side effects that she is aware of. She obtains it essentially at no cost. She tells me she gets angina like chest pain after doing her housework. This resolves with rest. She has not had any unusual headaches. Of course she is blind in her left eye and has a cataract in her right eye, which is also status post retinal surgery. She denies nausea or vomiting, cough, phlegm production, pleurisy, or change in bowel or bladder habits. There has been no focal pain, bleeding, rash, or unexplained weight loss. A detailed review of systems today was otherwise stable.  PAST MEDICAL HISTORY: Past Medical History  Diagnosis Date  . Asthma   . Spinal stenosis   . Osteoporosis   . Chronic bronchitis   . DDD (degenerative disc disease)   . Osteoarthritis   . Degenerative joint disease   . Skin cancer of face     non melanoma  . Coronary artery disease Non-obstructive    a. 2012 Cath: LAD 40-59m>Med Rx;  b. 11/2013 Echo: EF 50-55%, nl wall motion;  c. 12/2013 Lexi MV: EF 74%, no ischemia.  . Depression   .  Anxiety   . COPD (chronic obstructive pulmonary disease)   . Dyslipidemia   . Cholelithiasis   . GERD (gastroesophageal reflux disease)   . Hx of radiation therapy 07/02/1999- 08/11/1999    right supraclavucular/axillary region: 5040 cGy, 28 fractions  . Breast cancer 05/07/1999, 06/2011    a. s/p bilat mastectomies.  recurrence 01/2014. Radiation, meds.  [name] Hx of radiation therapy 02/20/14- 03/27/14    right chest wall 4500 cGy 25 sessions  . Hypertension   . Kidney stones     one stones  . Cataract     eye surgery planned for 11/2014  . PVC's (premature ventricular contractions)   . Reflux esophagitis   . Pre-diabetes     PAST SURGICAL HISTORY: Past Surgical History  Procedure Laterality Date  . Vesicovaginal fistula closure w/ tah  age 16  . Tubal ligation  1961  . Appendectomy    . Abdominal hysterectomy      in her 30's  . Small intestine surgery  1998    [facility]  . Tonsillectomy    . Breast lumpectomy  2002    right breast  . Mastectomy  07/29/11    bilateral-rt nodes-none on left  . Cardiac catheterization  03/2011, 3/15  . Breast biopsy Right 01/21/2014    Procedure: BREAST/CHEST WALL BIOPSY;  Surgeon: Angel Ok, MD;  Location: [facility];  Service: General;  Laterality: Right;  . Vein ligation and stripping Right   . Left heart catheterization with coronary angiogram N/A 12/05/2013    Procedure: LEFT HEART CATHETERIZATION WITH CORONARY ANGIOGRAM;  Surgeon: [name], MD;  Location: [facility] CATH LAB;  Service: Cardiovascular;  Laterality: N/A;    FAMILY HISTORY Family History  Problem Relation Age of Onset  . Pancreatic cancer Mother   . Cancer Mother     pancreatic  . Asthma Brother   . Asthma Daughter   . Stroke Daughter   . Asthma Grandchild   . Asthma Brother     deceased  . Heart disease Father   . Emphysema Father   . Heart disease Brother   . Lymphoma Brother    the patient's father died at the age of 79 from heart disease. He was a smoker. The patient's mother died at the age of 33 with cancer of the pancreas. The patient has 3 brothers, no sisters. There is no history of breast or ovarian cancer in the family to her knowledge.  GYNECOLOGIC HISTORY:  No LMP recorded. Patient has had a hysterectomy. Menarche age 28, first live birth age 71. She is GX P4. She  stopped having periods in her 41s. She took hormone replacement for approximately 10 years. 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 She has been "single" for 40 years. Her oldest daughter died from a subarachnoid hemorrhage at age 11, 2 years ago. This is when the patient had undergone treatment for invasive breast cancer and she tells me she was simply overwhelmed and could not start the planned antiestrogen therapy. The next daughter, [name], is a retired Software engineer. She lives in [place]. Next child, Freda Munro, works in Charity fundraiser. She also lives in [place]. The youngest, [name], is retired from the Office manager position. He also owned his own business. The patient has 7 grandchildren and 2 many great-grandchildren to count. She attends the [employer]    ADVANCED DIRECTIVES: In place; the patient's daughter Freda Munro  is her healthcare power of attorney. She can be reached at [place]: History  Substance Use Topics  . Smoking status: Never Smoker   . Smokeless tobacco: Never Used  . Alcohol Use: Yes     Comment: occ wine     Colonoscopy:  PAP:  Bone density:  Lipid panel:  Allergies  Allergen Reactions  . Penicillins Rash  . Adhesive [Tape] Other (See Comments)    Pt prefers to use paper tape    Current Outpatient Prescriptions  Medication Sig Dispense Refill  . albuterol (PROVENTIL HFA;VENTOLIN HFA) 108 (90 BASE) MCG/ACT inhaler Inhale into the lungs every 6 (six) hours as needed for wheezing or shortness of breath.    [name] aspirin EC 81 MG tablet Take 1 tablet (81 mg total) by mouth daily.    . B Complex-Biotin-FA (B-COMPLEX PO) Take by mouth.    . Biotin 5000 MCG CAPS Take 5,000 mcg by mouth daily.    . budesonide-formoterol (SYMBICORT) 160-4.5 MCG/ACT inhaler Inhale 2 puffs into the lungs 2 (two) times daily as needed. 3  Inhaler 1  . Calcium-Magnesium-Zinc 1000-400-15 MG TABS Take 1 tablet by mouth daily.      . celecoxib (CELEBREX) 200 MG capsule Take 1 capsule daily with food for pain & inflammation 90 capsule 0  . Cholecalciferol (VITAMIN D-3) 5000 UNITS TABS Take 1 tablet by mouth daily.     . diazepam (VALIUM) 5 MG tablet Take 1/2 to 1 tablet 3 x day if needed for Anxiety - need Office visit before further refills 30 tablet 0  . DULoxetine (CYMBALTA) 60 MG capsule Take 1 capsule by mouth  every day for mood 90 capsule 0  . ezetimibe (ZETIA) 10 MG tablet Take 10 mg by mouth daily.    . Fish Oil OIL Take 100 mg by mouth 3 (three) times daily.    . Flaxseed, Linseed, (FLAX SEED OIL PO) Take 5 mLs by mouth daily. 1 tsp daily    . HYDROcodone-acetaminophen (NORCO) 10-325 MG per tablet Take 1 tablet by mouth every 6 (six) hours as needed.    . isosorbide mononitrate (IMDUR) 30 MG 24 hr tablet Take 1 tablet (30 mg total) by mouth daily. 30 tablet 3  . letrozole (FEMARA) 2.5 MG tablet Take 1 tablet by mouth  daily 90 tablet 1  . Misc Natural Products (GLUCOSAMINE CHOND COMPLEX/MSM PO) Place inside cheek.    . montelukast (SINGULAIR) 10 MG tablet Take 1 tablet (10 mg total) by mouth at bedtime. 90 tablet 2  . Multiple Vitamin (MULTIVITAMIN) tablet Take 1 tablet by mouth daily.    . nitroGLYCERIN (NITROSTAT) 0.4 MG SL tablet Place 1 tablet (0.4 mg total) under the tongue every 5 (five) minutes x 3 doses as needed for chest pain. 25 tablet 3  . raloxifene (EVISTA) 60 MG tablet Take 60 mg by mouth at bedtime.     No current facility-administered medications for this visit.    OBJECTIVE: Older white woman in no acute distress Filed Vitals:   11/05/14 0941  BP: 109/69  Pulse: 84  Temp: 97.2 F (36.2 C)  Resp: 18     Body mass index is 24.48 kg/(m^2).    ECOG FS:1 - Symptomatic but completely ambulatory  Sclerae unicteric, right pupil round and reactive; left eye is blind Oropharynx clear and moist-- no thrush  or other lesions noted  No cervical or supraclavicular adenopathy Lungs no rales or rhonchi Heart regular rate and rhythm Abd soft,  nontender, positive bowel sounds MSK no focal spinal tenderness, no upper extremity lymphedema Neuro: nonfocal, well oriented, appropriate affect Breasts: status post bilateral mastectomies. No evidence of chest wall recurrence. Both axillae are benign   LAB RESULTS:  CMP     Component Value Date/Time   NA 137 11/01/2014 1128   NA 140 08/21/2014 1459   K 4.1 11/01/2014 1128   K 4.2 08/21/2014 1459   CL 102 11/01/2014 1128   CO2 30 11/01/2014 1128   CO2 28 08/21/2014 1459   GLUCOSE 87 11/01/2014 1128   GLUCOSE 116 08/21/2014 1459   BUN 14 11/01/2014 1128   BUN 11.6 08/21/2014 1459   CREATININE 0.70 11/01/2014 1128   CREATININE 0.8 08/21/2014 1459   CREATININE 0.78 05/01/2014 1050   CALCIUM 9.6 11/01/2014 1128   CALCIUM 10.5* 08/21/2014 1459   PROT 6.6 11/01/2014 1128   PROT 6.8 08/21/2014 1459   ALBUMIN 4.0 11/01/2014 1128   ALBUMIN 4.0 08/21/2014 1459   AST 18 11/01/2014 1128   AST 20 08/21/2014 1459   ALT 11 11/01/2014 1128   ALT 14 08/21/2014 1459   ALKPHOS 40 11/01/2014 1128   ALKPHOS 56 08/21/2014 1459   BILITOT 0.5 11/01/2014 1128   BILITOT 0.26 08/21/2014 1459   GFRNONAA 73 05/01/2014 1050   GFRNONAA 80* 01/20/2014 0415   GFRAA 84 05/01/2014 1050   GFRAA >90 01/20/2014 0415    I No results found for: SPEP  Lab Results  Component Value Date   WBC 6.6 11/05/2014   NEUTROABS 3.1 11/05/2014   HGB 13.3 11/05/2014   HCT 40.4 11/05/2014   MCV 95.5 11/05/2014   PLT 225 11/05/2014      Chemistry      Component Value Date/Time   NA 137 11/01/2014 1128   NA 140 08/21/2014 1459   K 4.1 11/01/2014 1128   K 4.2 08/21/2014 1459   CL 102 11/01/2014 1128   CO2 30 11/01/2014 1128   CO2 28 08/21/2014 1459   BUN 14 11/01/2014 1128   BUN 11.6 08/21/2014 1459   CREATININE 0.70 11/01/2014 1128   CREATININE 0.8 08/21/2014 1459    CREATININE 0.78 05/01/2014 1050      Component Value Date/Time   CALCIUM 9.6 11/01/2014 1128   CALCIUM 10.5* 08/21/2014 1459   ALKPHOS 40 11/01/2014 1128   ALKPHOS 56 08/21/2014 1459   AST 18 11/01/2014 1128   AST 20 08/21/2014 1459   ALT 11 11/01/2014 1128   ALT 14 08/21/2014 1459   BILITOT 0.5 11/01/2014 1128   BILITOT 0.26 08/21/2014 1459       Lab Results  Component Value Date   LABCA2 20 01/31/2014    No components found for: LABCA125   Recent Labs Lab 11/01/14 1128  INR 1.0    Urinalysis    Component Value Date/Time   COLORURINE YELLOW 09/05/2014 [place] 09/05/2014 1154   LABSPEC 1.006 09/05/2014 1154   PHURINE 6.5 09/05/2014 1154   GLUCOSEU NEG 09/05/2014 1154   HGBUR NEG 09/05/2014 1154   BILIRUBINUR NEG 09/05/2014 1154   KETONESUR NEG 09/05/2014 1154   PROTEINUR NEG 09/05/2014 1154   UROBILINOGEN 0.2 09/05/2014 1154   NITRITE NEG 09/05/2014 1154   LEUKOCYTESUR NEG 09/05/2014 1154    STUDIES: No results found.   ASSESSMENT: 81 y.o. [place] woman with locally recurrent breast cancer, as follows:  (1) status post right lumpectomy and sentinel node sampling September 2000 for ductal carcinoma in situ (Ni+), estrogen and progesterone receptor  positive, with a low proliferation index,  (a) received 5040 cGy to the right breast and regional lymph nodes  (b) on tamoxifen less than 3 months  (2) s/p bilateral mastectomies and right axillary lymph node sampling 07/29/2011, showing  (a) in the left breast, ductal carcinoma in situ measuring 1.2 cm, grade 1, with negative margins, estrogen and progesterone receptor positive  (b)  on the right, a pT1c pN0, stage IA invasive ductal carcinoma, estrogen receptor and 93% positive, progesterone receptor 87% positive, with an MIB-1 of 35%, and no HER-2 amplification margins were close but negative  (c) did not receive adjuvant antiestrogen therapy  (3) right chest wall skin recurrence resected  01/21/2014, measuring 1.7 cm, with positive margins, estrogen receptor 100% positive, progesterone receptor 75% positive, with an MIB-1 of 80%, and no HER-2 amplification; PET scan 02/15/2014 negative except for Right chest wall focus  (a) received 45 Gy electron-beam therapy to the right chest wall completed 03/27/2014  (b) skin progression within and without the radiation field noted 04/30/2014  (c) letrozole started 05/13/2014  (d) to start palbociclib when clinically stable  (4) dexa scan 05/09/2014 at [facility] shows osteoporosis with a T score of -2.9  PLAN: I had hoped to be able to start Heran's Palbociclib at this point but she is going to have some coronary artery work and also some right eye surgery in the next few weeks. The last thing we want to do is drop her counts.  Accordingly what we will do is see her again in a couple of months. If everything has been taking care of and no further interventions are planned, we will start Palbociclib at that time. Her dose will be 75 mg daily and we will start checking her counts on a weekly basis until we can be sure she can tolerate that.  Of course she knows to call for any other problems that may develop before her next visit here.  Angel Cruel, MD   11/05/2014 9:45 AM

## 2014-11-05 NOTE — Telephone Encounter (Signed)
, °

## 2014-11-06 ENCOUNTER — Encounter (HOSPITAL_COMMUNITY): Payer: Self-pay | Admitting: Interventional Cardiology

## 2014-11-06 ENCOUNTER — Ambulatory Visit (HOSPITAL_COMMUNITY)
Admission: RE | Admit: 2014-11-06 | Discharge: 2014-11-06 | Disposition: A | Discharge status: Home / Self Care | Payer: Medicare Other | Source: Ambulatory Visit | Attending: Interventional Cardiology | Admitting: Interventional Cardiology

## 2014-11-06 ENCOUNTER — Encounter (HOSPITAL_COMMUNITY)
Admission: RE | Disposition: A | Discharge status: Home / Self Care | Payer: Self-pay | Source: Ambulatory Visit | Attending: Interventional Cardiology

## 2014-11-06 DIAGNOSIS — M1990 Unspecified osteoarthritis, unspecified site: Secondary | ICD-10-CM | POA: Diagnosis not present

## 2014-11-06 DIAGNOSIS — M810 Age-related osteoporosis without current pathological fracture: Secondary | ICD-10-CM | POA: Diagnosis not present

## 2014-11-06 DIAGNOSIS — Z85828 Personal history of other malignant neoplasm of skin: Secondary | ICD-10-CM | POA: Insufficient documentation

## 2014-11-06 DIAGNOSIS — F329 Major depressive disorder, single episode, unspecified: Secondary | ICD-10-CM | POA: Insufficient documentation

## 2014-11-06 DIAGNOSIS — J42 Unspecified chronic bronchitis: Secondary | ICD-10-CM | POA: Diagnosis not present

## 2014-11-06 DIAGNOSIS — I493 Ventricular premature depolarization: Secondary | ICD-10-CM | POA: Diagnosis present

## 2014-11-06 DIAGNOSIS — I25119 Atherosclerotic heart disease of native coronary artery with unspecified angina pectoris: Secondary | ICD-10-CM

## 2014-11-06 DIAGNOSIS — Z79899 Other long term (current) drug therapy: Secondary | ICD-10-CM | POA: Insufficient documentation

## 2014-11-06 DIAGNOSIS — I2510 Atherosclerotic heart disease of native coronary artery without angina pectoris: Secondary | ICD-10-CM

## 2014-11-06 DIAGNOSIS — J45909 Unspecified asthma, uncomplicated: Secondary | ICD-10-CM | POA: Diagnosis not present

## 2014-11-06 DIAGNOSIS — Z87442 Personal history of urinary calculi: Secondary | ICD-10-CM | POA: Insufficient documentation

## 2014-11-06 DIAGNOSIS — E785 Hyperlipidemia, unspecified: Secondary | ICD-10-CM | POA: Diagnosis not present

## 2014-11-06 DIAGNOSIS — I10 Essential (primary) hypertension: Secondary | ICD-10-CM | POA: Insufficient documentation

## 2014-11-06 DIAGNOSIS — J449 Chronic obstructive pulmonary disease, unspecified: Secondary | ICD-10-CM | POA: Diagnosis not present

## 2014-11-06 DIAGNOSIS — Z7982 Long term (current) use of aspirin: Secondary | ICD-10-CM | POA: Insufficient documentation

## 2014-11-06 DIAGNOSIS — R079 Chest pain, unspecified: Secondary | ICD-10-CM | POA: Diagnosis present

## 2014-11-06 DIAGNOSIS — K210 Gastro-esophageal reflux disease with esophagitis: Secondary | ICD-10-CM | POA: Diagnosis not present

## 2014-11-06 DIAGNOSIS — Z853 Personal history of malignant neoplasm of breast: Secondary | ICD-10-CM | POA: Diagnosis not present

## 2014-11-06 HISTORY — PX: LEFT HEART CATHETERIZATION WITH CORONARY ANGIOGRAM: SHX5451

## 2014-11-06 SURGERY — LEFT HEART CATHETERIZATION WITH CORONARY ANGIOGRAM
Anesthesia: LOCAL

## 2014-11-06 MED ORDER — LIDOCAINE HCL (PF) 1 % IJ SOLN
INTRAMUSCULAR | Status: AC
  Filled 2014-11-06: qty 30

## 2014-11-06 MED ORDER — HEPARIN (PORCINE) IN NACL 2-0.9 UNIT/ML-% IJ SOLN
INTRAMUSCULAR | Status: AC
  Filled 2014-11-06: qty 1500

## 2014-11-06 MED ORDER — ASPIRIN 81 MG PO CHEW: 81.0000 mg | CHEWABLE_TABLET | ORAL | Status: DC

## 2014-11-06 MED ORDER — MIDAZOLAM HCL 2 MG/2ML IJ SOLN
INTRAMUSCULAR | Status: AC
  Filled 2014-11-06: qty 2

## 2014-11-06 MED ORDER — FENTANYL CITRATE 0.05 MG/ML IJ SOLN
INTRAMUSCULAR | Status: AC
  Filled 2014-11-06: qty 2

## 2014-11-06 MED ORDER — SODIUM CHLORIDE 0.9 % IV SOLN: INTRAVENOUS | Status: DC

## 2014-11-06 MED ORDER — NITROGLYCERIN 1 MG/10 ML FOR IR/CATH LAB
INTRA_ARTERIAL | Status: AC
  Filled 2014-11-06: qty 10

## 2014-11-06 MED ORDER — VERAPAMIL HCL 2.5 MG/ML IV SOLN
INTRAVENOUS | Status: AC
  Filled 2014-11-06: qty 2

## 2014-11-06 MED ORDER — HEPARIN SODIUM (PORCINE) 1000 UNIT/ML IJ SOLN
INTRAMUSCULAR | Status: AC
  Filled 2014-11-06: qty 1

## 2014-11-06 NOTE — H&P (View-Only) (Signed)
Cardiology Office Note Date:  11/01/2014  Patient ID:  Angel [name], Angel [name] 1932-06-27, MRN [id number] PCP:  [name], DO  Cardiologist:  Dr. Radford Pax    Chief Complaint: chest pain  History of Present Illness: Angel [name] is a 81 y.o. female with history of non-obstructive CAD (2012: mid LAD 40-50% stenosis), DDD, dyslipidemia, breast CA (tx with mastectomy, radiation, meds - followed by onc), remote asthma, GERD, reflux esophagitis, PVCs, pre-DM, and depression who presents for evaluation of chest pain. Last cardiac studies include 2D echo 11/2013: EF 50-55%, no RWMA, atrial septal aneurysm without shunt, normal diastolic function. Nuc 12/2013: no evidence of ischemia, normal cardiac function, EF 74%, mildly decreased radiotracer uptake in the ventricular septum on all images suggest the possibility of a region of prior infarct/scar versus BBB. Per Dr. Theodosia Blender note, she has been on several antianginal meds.Ranolazine was added to her medical regimen but she had to stop it after a few days due to severe fatigue. She also had significant side effects to metoprolol with fatigue and lethargy and it was stopped.Hgb normal, LDL 86 in 08/2014.  She has been having occasional chest discomfort, prompting today's visit. She is concerned it is coming from her heart. It doesn't happen every day, but is happening more frequently. It tends to occur about 3 hours into physical activity (not exercise, just regular activity). During those initial 3 hours she feels fine, but after 3 hours she begins to notice chest pressure. She stops and rests and the pain resolves within 2-3 hours. She says after that, "I'm spent for the next day or so." She has also had some SOB, dizziness, and random diaphoresis at times. She has occasional palpitations that she perceives as her PVCs. No syncope, bleeding, orthopnea. She has occasional trace LEE which has improved with limiting salt intake. The other night she had  an episode of pain that woke her up in the middle of the night. It did not change with NTG. In fact, NTG generally makes her feel worse with nausea and headache. Her chest pain is generally relieved with Vicodin which she takes regularly PRN. It is similar to what prompted eval in 12/2013 with normal stress test. She denies chest pain currently.  Past Medical History  Diagnosis Date  . Asthma   . Spinal stenosis   . Osteoporosis   . Chronic bronchitis   . DDD (degenerative disc disease)   . Osteoarthritis   . Degenerative joint disease   . Skin cancer of face     non melanoma  . Coronary artery disease Non-obstructive    a. 2012 Cath: LAD 40-10m->Med Rx;  b. 11/2013 Echo: EF 50-55%, nl wall motion;  c. 12/2013 Lexi MV: EF 74%, no ischemia.  . Depression   . Anxiety   . COPD (chronic obstructive pulmonary disease)   . Dyslipidemia   . Cholelithiasis   . GERD (gastroesophageal reflux disease)   . Hx of radiation therapy 07/02/1999- 08/11/1999    right supraclavucular/axillary region: 5040 cGy, 28 fractions  . Breast cancer 05/07/1999, 06/2011    a. s/p bilat mastectomies. recurrence 01/2014. Radiation, meds.  [name] Hx of radiation therapy 02/20/14- 03/27/14    right chest wall 4500 cGy 25 sessions  . Hypertension   . Kidney stones     one stones  . Cataract     eye surgery planned for 11/2014  . PVC's (premature ventricular contractions)   . Reflux esophagitis   . Pre-diabetes  Past Surgical History  Procedure Laterality Date  . Vesicovaginal fistula closure w/ tah  age 41  . Tubal ligation  1961  . Appendectomy    . Abdominal hysterectomy      in her 30's  . Small intestine surgery  1998    S[facility]  . Tonsillectomy    . Breast lumpectomy  2002    right breast  . Mastectomy  07/29/11    bilateral-rt nodes-none on left  . Cardiac catheterization  03/2011, 3/15  . Breast biopsy Right 01/21/2014    Procedure: BREAST/CHEST WALL BIOPSY;  Surgeon: [name], MD;  Location: [facility];   Service: General;  Laterality: Right;  . Vein ligation and stripping Right   . Left heart catheterization with coronary angiogram N/A 12/05/2013    Procedure: LEFT HEART CATHETERIZATION WITH CORONARY ANGIOGRAM;  Surgeon: [name], MD;  Location: [facility] CATH LAB;  Service: Cardiovascular;  Laterality: N/A;    Current Outpatient Prescriptions  Medication Sig Dispense Refill  . HYDROcodone-acetaminophen (NORCO) 10-325 MG per tablet Take 1 tablet by mouth every 6 (six) hours as needed.    . raloxifene (EVISTA) 60 MG tablet Take 60 mg by mouth at bedtime.    [name] albuterol (PROVENTIL HFA;VENTOLIN HFA) 108 (90 BASE) MCG/ACT inhaler Inhale into the lungs every 6 (six) hours as needed for wheezing or shortness of breath.    [name] aspirin EC 81 MG tablet Take 1 tablet (81 mg total) by mouth daily.    . B Complex-Biotin-FA (B-COMPLEX PO) Take by mouth.    . Biotin 5000 MCG CAPS Take 5,000 mcg by mouth daily.    . budesonide-formoterol (SYMBICORT) 160-4.5 MCG/ACT inhaler Inhale 2 puffs into the lungs 2 (two) times daily as needed. 3 Inhaler 1  . Calcium-Magnesium-Zinc 1000-400-15 MG TABS Take 1 tablet by mouth daily.      . celecoxib (CELEBREX) 200 MG capsule Take 1 capsule daily with food for pain & inflammation 90 capsule 0  . Cholecalciferol (VITAMIN D-3) 5000 UNITS TABS Take 1 tablet by mouth daily.     . diazepam (VALIUM) 5 MG tablet Take 1/2 to 1 tablet 3 x day if needed for Anxiety - need Office visit before further refills 30 tablet 0  . DULoxetine (CYMBALTA) 60 MG capsule Take 1 capsule by mouth  every day for mood 90 capsule 0  . ezetimibe (ZETIA) 10 MG tablet Take 10 mg by mouth daily.    . Fish Oil OIL Take 100 mg by mouth 3 (three) times daily.    . Flaxseed, Linseed, (FLAX SEED OIL PO) Take 5 mLs by mouth daily. 1 tsp daily    . isosorbide mononitrate (IMDUR) 30 MG 24 hr tablet Take 1 tablet (30 mg total) by mouth daily. 30 tablet 3  . letrozole (FEMARA) 2.5 MG tablet Take 1 tablet by  mouth  daily 90 tablet 1  . Misc Natural Products (GLUCOSAMINE CHOND COMPLEX/MSM PO) Place inside cheek.    . montelukast (SINGULAIR) 10 MG tablet Take 1 tablet (10 mg total) by mouth at bedtime. 90 tablet 2  . Multiple Vitamin (MULTIVITAMIN) tablet Take 1 tablet by mouth daily.    . nitroGLYCERIN (NITROSTAT) 0.4 MG SL tablet Place 1 tablet (0.4 mg total) under the tongue every 5 (five) minutes x 3 doses as needed for chest pain. 25 tablet 3   No current facility-administered medications for this visit.    Allergies:   Penicillins and Adhesive   Social History:  The patient  reports that she has never smoked. She has never used smokeless tobacco. She reports that she drinks alcohol. She reports that she does not use illicit drugs.   Family History:  The patient's family history includes Asthma in her brother, brother, daughter, and grandchild; Cancer in her mother; Emphysema in her father; Heart disease in her brother and father; Lymphoma in her brother; Pancreatic cancer in her mother; Stroke in her daughter.   ROS:  Please see the history of present illness.  All other systems are reviewed and otherwise negative.   PHYSICAL EXAM:  VS:  BP 124/64 mmHg  Pulse 70  Ht 5' 2.5" (1.588 m)  Wt 137 lb (62.143 kg)  BMI 24.64 kg/m2 BMI: Body mass index is 24.64 kg/(m^2). 97% RA. Well nourished, well developed WF, in no acute distress HEENT: normocephalic, atraumatic Neck: no JVD Cardiac:  normal S1, S2; RRR; no murmur Lungs:  clear to auscultation bilaterally, no wheezing, rhonchi or rales Abd: soft, nontender, no hepatomegaly Ext: no edema Skin: warm and dry Neuro:  moves all extremities spontaneously, no focal abnormalities noted  EKG:  NSR 70bpm, TWI avL, no acute ST-T changes  Recent Labs: 01/17/2014: Pro B Natriuretic peptide (BNP) 163.6 08/21/2014: ALT 14; BUN 11.6; Creatinine 0.8; Potassium 4.2; Sodium 140 09/06/2014: Magnesium 1.9; TSH 3.234 09/17/2014: Hemoglobin 12.4;  Platelets 171  09/06/2014: Cholesterol, Total 159; HDL Cholesterol by NMR 60; LDL (calc) 86; Total CHOL/HDL Ratio 2.7; Triglycerides 67; VLDL 13   CrCl cannot be calculated (Patient has no serum creatinine result on file.).   Wt Readings from Last 3 Encounters:  11/01/14 137 lb (62.143 kg)  10/24/14 139 lb 9.6 oz (63.322 kg)  09/17/14 142 lb 1.6 oz (64.456 kg)     Other studies reviewed: Additional studies/records reviewed today include: nuc, echo as above.  ASSESSMENT AND PLAN:  1. Chest pain, unspecified - some typical and atypical features. H/o moderate CAD in 2012 but normal EKG today. We discussed stress testing versus cardiac catheterization. The patient is adamant that she wants to make sure her heart is strong enough to fight her cancer. She wants to know 100% for sure the status of her prior heart blockage. She requests to proceed with cardiac catheterization. Risks and benefits of cardiac catheterization have been discussed with the patient.  These include bleeding, infection, kidney damage, stroke, heart attack, death. The patient understands these risks and is willing to proceed. Continue ASA, Imdur.  Intolerant of BB and statins in the past. PE felt less likely given not tachycardic, tachypnic or hypoxic. She ambulated extensively in the officeat 97% oxygen saturation on room air. She has no upcoming surgical procedures planned. heck pre-cath labs. Also d/w Dr. Radford Pax. ER precautions reviewed.  2. CAD (nonobstructive in 2012) - see above.  3. Essential HTN - controlled.  4. Mixed HLD - reportedly intolerant to statins in the past. LDL 86 in 08/2014. 5. H/o GERD - if above workup unrevealing would consider referral to GI. 6. H/o PVCs - quiescent by today's EKG.  Disposition: F/u with Dr. Radford Pax or APP 2 weeks post-cath.  Current medicines are reviewed at length with the patient today.  The patient did not have any concerns regarding medicines.  Raechel Ache  PA-C 11/01/2014 10:33 AM     [facility] [place] [address] [phone] (office)  [phone] (fax)

## 2014-11-06 NOTE — CV Procedure (Signed)
     Left Heart Catheterization with Coronary Angiography  Report  Angel [name]  81 y.o.  female Feb 17, 1932  Procedure Date: 11/06/2014 Referring Physician: Fransico Him, M.D. Primary Cardiologist:: Fransico Him, M.D.  INDICATIONS: Chest pain, atypical. Prior history of coronary artery disease 60% mid LAD after a large diagonal.  PROCEDURE: 1. Left heart catheterization; 2. Coronary angiography; 3. Left ventriculography  CONSENT:  The risks, benefits, and details of the procedure were explained in detail to the patient. Risks including death, stroke, heart attack, kidney injury, allergy, limb ischemia, bleeding and radiation injury were discussed.  The patient verbalized understanding and wanted to proceed.  Informed written consent was obtained.  PROCEDURE TECHNIQUE:  After Xylocaine anesthesia a 5 French slender sheath was placed in the left radial artery with an angiocath and the modified Seldinger technique.  Coronary angiography was done using a 5 F JR4 and JL 3.5 cm diagnostic catheter.  Left ventriculography was done using the JR 4 catheter and hand injection.   Hemostasis was achieved with a left radial wrist band.   CONTRAST:  Total of 60 cc.  COMPLICATIONS:  None   HEMODYNAMICS:  Aortic pressure 107/55 mmHg; LV pressure 110/0 mmHg; LVEDP 6 mmHg  ANGIOGRAPHIC DATA:   The left main coronary artery is normal.  The left anterior descending artery is relatively small in distribution. It bifurcates into a large branching first diagonal and continuation of the LAD was stopped short of the left ventricular apex. The ostium of the diagonal contains 20-30% narrowing. The LAD beyond the ostium contains 50-60% eccentric narrowing. This is identical location of the lesion noted on prior angiograms in 2015 in 2012. No significant change in appearance this occurred. The patient's nuclear perfusion study done in April 2015 did not demonstrate anteroapical ischemia which would be the  distribution of the LAD.[name]  The left circumflex artery is moderate in size. The vessels are tortuous and widely patent..  The right coronary artery is a large dominant vessel giving a large 2 a large PDA that wraps around the left ventricular apex and large left ventricular branches. The vessel is entirely normal..   LEFT VENTRICULOGRAM:  Left ventricular angiogram was done in the 30 RAO projection and revealed a normal size cavity with normal contractility.   IMPRESSIONS:  1. 50-70% mid LAD unchanged from prior angiograms dating back to 2012. Without evidence of ischemia this region is stable and wants no intervention. Especially given that her symptoms are very atypical. 2. Essentially normal circumflex and right coronaries 3. Normal left ventricular function 4. Atypical symptoms which should not result in further invasive evaluation unless ischemia can be documented or there is clearly EKG or biologic marker evidence of ischemia. Certainly need to consider other causes for the patient's somewhat atypical complaints.   RECOMMENDATION:  Discharge later today after ambulates.[name]

## 2014-11-06 NOTE — Interval H&P Note (Signed)
Cath Lab Visit (complete for each Cath Lab visit)  Clinical Evaluation Leading to the Procedure:   ACS: No.  Non-ACS:    Anginal Classification: CCS II  Anti-ischemic medical therapy: Minimal Therapy (1 class of medications)  Non-Invasive Test Results: No non-invasive testing performed  Prior CABG: No previous CABG      History and Physical Interval Note:  11/06/2014 10:07 AM  Angel [name]  has presented today for surgery, with the diagnosis of CP  The various methods of treatment have been discussed with the patient and family. After consideration of risks, benefits and other options for treatment, the patient has consented to  Procedure(s): LEFT HEART CATHETERIZATION WITH CORONARY ANGIOGRAM (N/A) as a surgical intervention .  The patient's history has been reviewed, patient examined, no change in status, stable for surgery.  I have reviewed the patient's chart and labs.  Questions were answered to the patient's satisfaction.     [name]

## 2014-11-06 NOTE — Discharge Instructions (Signed)
Radial Site Care °Refer to this sheet in the next few weeks. These instructions provide you with information on caring for yourself after your procedure. Your caregiver may also give you more specific instructions. Your treatment has been planned according to current medical practices, but problems sometimes occur. Call your caregiver if you have any problems or questions after your procedure. °HOME CARE INSTRUCTIONS °· You may shower the day after the procedure. Remove the bandage (dressing) and gently wash the site with plain soap and water. Gently pat the site dry. °· Do not apply powder or lotion to the site. °· Do not submerge the affected site in water for 3 to 5 days. °· Inspect the site at least twice daily. °· Do not flex or bend the affected arm for 24 hours. °· No lifting over 5 pounds (2.3 kg) for 5 days after your procedure. °· Do not drive home if you are discharged the same day of the procedure. Have someone else drive you. °· You may drive 24 hours after the procedure unless otherwise instructed by your caregiver. °· Do not operate machinery or power tools for 24 hours. °· A responsible adult should be with you for the first 24 hours after you arrive home. °What to expect: °· Any bruising will usually fade within 1 to 2 weeks. °· Blood that collects in the tissue (hematoma) may be painful to the touch. It should usually decrease in size and tenderness within 1 to 2 weeks. °SEEK IMMEDIATE MEDICAL CARE IF: °· You have unusual pain at the radial site. °· You have redness, warmth, swelling, or pain at the radial site. °· You have drainage (other than a small amount of blood on the dressing). °· You have chills. °· You have a fever or persistent symptoms for more than 72 hours. °· You have a fever and your symptoms suddenly get worse. °· Your arm becomes pale, cool, tingly, or numb. °· You have heavy bleeding from the site. Hold pressure on the site. °Document Released: 10/16/2010 Document Revised:  12/06/2011 Document Reviewed: 10/16/2010 °[employer]® Patient Information ©2015 [employer]. This information is not intended to replace advice given to you by your health care provider. Make sure you discuss any questions you have with your health care provider. ° °

## 2014-11-14 ENCOUNTER — Encounter: Payer: Self-pay | Admitting: Oncology

## 2014-11-14 DIAGNOSIS — H35351 Cystoid macular degeneration, right eye: Secondary | ICD-10-CM | POA: Insufficient documentation

## 2014-11-14 NOTE — Progress Notes (Signed)
Pt is approved for the $1000 [employer] grant.  

## 2014-11-15 DIAGNOSIS — H2513 Age-related nuclear cataract, bilateral: Secondary | ICD-10-CM | POA: Diagnosis not present

## 2014-11-15 DIAGNOSIS — Z79899 Other long term (current) drug therapy: Secondary | ICD-10-CM | POA: Diagnosis not present

## 2014-11-15 DIAGNOSIS — H179 Unspecified corneal scar and opacity: Secondary | ICD-10-CM | POA: Diagnosis not present

## 2014-11-15 DIAGNOSIS — Z880 Allergy status to penicillin: Secondary | ICD-10-CM | POA: Diagnosis not present

## 2014-11-15 DIAGNOSIS — I10 Essential (primary) hypertension: Secondary | ICD-10-CM | POA: Diagnosis not present

## 2014-11-15 DIAGNOSIS — Z48810 Encounter for surgical aftercare following surgery on the sense organs: Secondary | ICD-10-CM | POA: Diagnosis not present

## 2014-11-15 DIAGNOSIS — Z9889 Other specified postprocedural states: Secondary | ICD-10-CM | POA: Diagnosis not present

## 2014-11-15 DIAGNOSIS — H269 Unspecified cataract: Secondary | ICD-10-CM | POA: Diagnosis not present

## 2014-11-15 DIAGNOSIS — C50919 Malignant neoplasm of unspecified site of unspecified female breast: Secondary | ICD-10-CM | POA: Diagnosis not present

## 2014-11-15 DIAGNOSIS — J45909 Unspecified asthma, uncomplicated: Secondary | ICD-10-CM | POA: Diagnosis not present

## 2014-11-15 DIAGNOSIS — H53009 Unspecified amblyopia, unspecified eye: Secondary | ICD-10-CM | POA: Diagnosis not present

## 2014-11-15 DIAGNOSIS — H35371 Puckering of macula, right eye: Secondary | ICD-10-CM | POA: Diagnosis not present

## 2014-11-15 DIAGNOSIS — H43812 Vitreous degeneration, left eye: Secondary | ICD-10-CM | POA: Diagnosis not present

## 2014-11-15 DIAGNOSIS — I2510 Atherosclerotic heart disease of native coronary artery without angina pectoris: Secondary | ICD-10-CM | POA: Diagnosis not present

## 2014-11-20 ENCOUNTER — Ambulatory Visit: Payer: Medicare Other | Admitting: Nurse Practitioner

## 2014-12-02 ENCOUNTER — Telehealth: Payer: Self-pay | Admitting: *Deleted

## 2014-12-02 NOTE — Telephone Encounter (Signed)
Patient called to request assistance with having forms completed for financial assistance.  Advised her to bring the forms to the office and that she can expect a 48 hour turn around.  She will bring the forms to [facility].

## 2014-12-06 ENCOUNTER — Ambulatory Visit (HOSPITAL_BASED_OUTPATIENT_CLINIC_OR_DEPARTMENT_OTHER): Payer: Medicare Other | Admitting: Nurse Practitioner

## 2014-12-06 ENCOUNTER — Emergency Department (HOSPITAL_COMMUNITY): Payer: Medicare Other

## 2014-12-06 ENCOUNTER — Other Ambulatory Visit: Payer: Self-pay

## 2014-12-06 ENCOUNTER — Telehealth: Payer: Self-pay | Admitting: *Deleted

## 2014-12-06 ENCOUNTER — Encounter (HOSPITAL_COMMUNITY): Payer: Self-pay | Admitting: Emergency Medicine

## 2014-12-06 ENCOUNTER — Emergency Department (HOSPITAL_COMMUNITY)
Admission: EM | Admit: 2014-12-06 | Discharge: 2014-12-06 | Disposition: A | Discharge status: Home / Self Care | Payer: Medicare Other | Attending: Emergency Medicine | Admitting: Emergency Medicine

## 2014-12-06 VITALS — BP 132/59 | HR 48 | Temp 97.9°F | Resp 28 | Wt 133.9 lb

## 2014-12-06 DIAGNOSIS — Z7951 Long term (current) use of inhaled steroids: Secondary | ICD-10-CM | POA: Diagnosis not present

## 2014-12-06 DIAGNOSIS — M25511 Pain in right shoulder: Secondary | ICD-10-CM | POA: Insufficient documentation

## 2014-12-06 DIAGNOSIS — R079 Chest pain, unspecified: Secondary | ICD-10-CM

## 2014-12-06 DIAGNOSIS — C50911 Malignant neoplasm of unspecified site of right female breast: Secondary | ICD-10-CM

## 2014-12-06 DIAGNOSIS — H269 Unspecified cataract: Secondary | ICD-10-CM | POA: Diagnosis not present

## 2014-12-06 DIAGNOSIS — Z7982 Long term (current) use of aspirin: Secondary | ICD-10-CM | POA: Diagnosis not present

## 2014-12-06 DIAGNOSIS — J441 Chronic obstructive pulmonary disease with (acute) exacerbation: Secondary | ICD-10-CM | POA: Diagnosis not present

## 2014-12-06 DIAGNOSIS — H5442 Blindness, left eye, normal vision right eye: Secondary | ICD-10-CM | POA: Insufficient documentation

## 2014-12-06 DIAGNOSIS — I10 Essential (primary) hypertension: Secondary | ICD-10-CM | POA: Diagnosis not present

## 2014-12-06 DIAGNOSIS — C44501 Unspecified malignant neoplasm of skin of breast: Secondary | ICD-10-CM

## 2014-12-06 DIAGNOSIS — R61 Generalized hyperhidrosis: Secondary | ICD-10-CM | POA: Diagnosis not present

## 2014-12-06 DIAGNOSIS — I2510 Atherosclerotic heart disease of native coronary artery without angina pectoris: Secondary | ICD-10-CM | POA: Diagnosis not present

## 2014-12-06 DIAGNOSIS — R0602 Shortness of breath: Secondary | ICD-10-CM

## 2014-12-06 DIAGNOSIS — F419 Anxiety disorder, unspecified: Secondary | ICD-10-CM | POA: Insufficient documentation

## 2014-12-06 DIAGNOSIS — K219 Gastro-esophageal reflux disease without esophagitis: Secondary | ICD-10-CM | POA: Insufficient documentation

## 2014-12-06 DIAGNOSIS — Z85828 Personal history of other malignant neoplasm of skin: Secondary | ICD-10-CM | POA: Diagnosis not present

## 2014-12-06 DIAGNOSIS — Z880 Allergy status to penicillin: Secondary | ICD-10-CM | POA: Insufficient documentation

## 2014-12-06 DIAGNOSIS — F329 Major depressive disorder, single episode, unspecified: Secondary | ICD-10-CM | POA: Diagnosis not present

## 2014-12-06 DIAGNOSIS — R51 Headache: Secondary | ICD-10-CM | POA: Diagnosis not present

## 2014-12-06 DIAGNOSIS — E785 Hyperlipidemia, unspecified: Secondary | ICD-10-CM | POA: Insufficient documentation

## 2014-12-06 DIAGNOSIS — Z9013 Acquired absence of bilateral breasts and nipples: Secondary | ICD-10-CM | POA: Insufficient documentation

## 2014-12-06 DIAGNOSIS — Z79899 Other long term (current) drug therapy: Secondary | ICD-10-CM | POA: Diagnosis not present

## 2014-12-06 DIAGNOSIS — Z923 Personal history of irradiation: Secondary | ICD-10-CM | POA: Insufficient documentation

## 2014-12-06 DIAGNOSIS — D0512 Intraductal carcinoma in situ of left breast: Secondary | ICD-10-CM | POA: Diagnosis not present

## 2014-12-06 DIAGNOSIS — Z853 Personal history of malignant neoplasm of breast: Secondary | ICD-10-CM | POA: Diagnosis not present

## 2014-12-06 DIAGNOSIS — Z87442 Personal history of urinary calculi: Secondary | ICD-10-CM | POA: Diagnosis not present

## 2014-12-06 DIAGNOSIS — Z9889 Other specified postprocedural states: Secondary | ICD-10-CM | POA: Insufficient documentation

## 2014-12-06 DIAGNOSIS — M810 Age-related osteoporosis without current pathological fracture: Secondary | ICD-10-CM | POA: Diagnosis not present

## 2014-12-06 DIAGNOSIS — M1990 Unspecified osteoarthritis, unspecified site: Secondary | ICD-10-CM | POA: Diagnosis not present

## 2014-12-06 DIAGNOSIS — Z9221 Personal history of antineoplastic chemotherapy: Secondary | ICD-10-CM | POA: Diagnosis not present

## 2014-12-06 DIAGNOSIS — R110 Nausea: Secondary | ICD-10-CM | POA: Diagnosis not present

## 2014-12-06 LAB — CBC
HEMATOCRIT: 41.4 % (ref 36.0–46.0)
Hemoglobin: 13.7 g/dL (ref 12.0–15.0)
MCH: 31.6 pg (ref 26.0–34.0)
MCHC: 33.1 g/dL (ref 30.0–36.0)
MCV: 95.6 fL (ref 78.0–100.0)
Platelets: 206 10*3/uL (ref 150–400)
RBC: 4.33 MIL/uL (ref 3.87–5.11)
RDW: 12.7 % (ref 11.5–15.5)
WBC: 5.1 10*3/uL (ref 4.0–10.5)

## 2014-12-06 LAB — BASIC METABOLIC PANEL
Anion gap: 7 (ref 5–15)
BUN: 14 mg/dL (ref 6–23)
CHLORIDE: 100 mmol/L (ref 96–112)
CO2: 28 mmol/L (ref 19–32)
Calcium: 9 mg/dL (ref 8.4–10.5)
Creatinine, Ser: 0.74 mg/dL (ref 0.50–1.10)
GFR calc non Af Amer: 78 mL/min — ABNORMAL LOW (ref >90)
GLUCOSE: 97 mg/dL (ref 70–99)
Potassium: 3.8 mmol/L (ref 3.5–5.1)
Sodium: 135 mmol/L (ref 135–145)

## 2014-12-06 LAB — URINALYSIS, ROUTINE W REFLEX MICROSCOPIC
Bilirubin Urine: NEGATIVE
GLUCOSE, UA: NEGATIVE mg/dL
HGB URINE DIPSTICK: NEGATIVE
Ketones, ur: NEGATIVE mg/dL
Leukocytes, UA: NEGATIVE
Nitrite: NEGATIVE
PROTEIN: NEGATIVE mg/dL
Specific Gravity, Urine: 1.003 — ABNORMAL LOW (ref 1.005–1.030)
Urobilinogen, UA: 0.2 mg/dL (ref 0.0–1.0)
pH: 6 (ref 5.0–8.0)

## 2014-12-06 LAB — I-STAT TROPONIN, ED: Troponin i, poc: 0 ng/mL (ref 0.00–0.08)

## 2014-12-06 LAB — D-DIMER, QUANTITATIVE: D-Dimer, Quant: 0.35 ug/mL-FEU (ref 0.00–0.48)

## 2014-12-06 LAB — BRAIN NATRIURETIC PEPTIDE: B Natriuretic Peptide: 95.4 pg/mL (ref 0.0–100.0)

## 2014-12-06 MED ORDER — OXYCODONE HCL 5 MG PO TABS: 5.0000 mg | ORAL_TABLET | ORAL | Status: DC | PRN

## 2014-12-06 MED ORDER — ONDANSETRON HCL 4 MG/2ML IJ SOLN
4.0000 mg | Freq: Once | INTRAMUSCULAR | Status: AC
  Administered 2014-12-06: 4 mg via INTRAVENOUS
  Filled 2014-12-06: qty 2

## 2014-12-06 MED ORDER — SODIUM CHLORIDE 0.9 % IV BOLUS (SEPSIS)
500.0000 mL | Freq: Once | INTRAVENOUS | Status: AC
  Administered 2014-12-06: 500 mL via INTRAVENOUS

## 2014-12-06 MED ORDER — MORPHINE SULFATE 4 MG/ML IJ SOLN
4.0000 mg | Freq: Once | INTRAMUSCULAR | Status: AC
  Administered 2014-12-06: 4 mg via INTRAVENOUS
  Filled 2014-12-06: qty 1

## 2014-12-06 MED ORDER — ONDANSETRON 4 MG PO TBDP: 4.0000 mg | ORAL_TABLET | Freq: Three times a day (TID) | ORAL | Status: DC | PRN

## 2014-12-06 NOTE — ED Provider Notes (Signed)
CSN: [id number]     Arrival date & time 12/06/14  1219 History   First MD Initiated Contact with Patient 12/06/14 1333     Chief Complaint  Patient presents with  . Chest Pain     (Consider location/radiation/quality/duration/timing/severity/associated sxs/prior Treatment) Patient is a 81 y.o. female presenting with chest pain. The history is provided by the patient.  Chest Pain Chest pain location: right shoulder. Pain quality: sharp   Pain radiates to:  Upper back and neck Pain radiates to the back: yes   Pain severity:  Moderate Onset quality:  Gradual Timing:  Intermittent Progression:  Worsening Chronicity:  Recurrent Context: lifting   Relieved by:  Nothing Worsened by:  Certain positions Ineffective treatments:  None tried Associated symptoms: cough, headache, nausea and shortness of breath   Associated symptoms: no abdominal pain, no back pain, no dizziness, no fatigue, no fever, no palpitations and not vomiting     Past Medical History  Diagnosis Date  . Asthma   . Spinal stenosis   . Osteoporosis   . Chronic bronchitis   . DDD (degenerative disc disease)   . Osteoarthritis   . Degenerative joint disease   . Skin cancer of face     non melanoma  . Coronary artery disease Non-obstructive    a. 2012 Cath: LAD 40-33m->Med Rx;  b. 11/2013 Echo: EF 50-55%, nl wall motion;  c. 12/2013 Lexi MV: EF 74%, no ischemia.  . Depression   . Anxiety   . COPD (chronic obstructive pulmonary disease)   . Dyslipidemia   . Cholelithiasis   . GERD (gastroesophageal reflux disease)   . Hx of radiation therapy 07/02/1999- 08/11/1999    right supraclavucular/axillary region: 5040 cGy, 28 fractions  . Breast cancer 05/07/1999, 06/2011    a. s/p bilat mastectomies. recurrence 01/2014. Radiation, meds.  [name] Hx of radiation therapy 02/20/14- 03/27/14    right chest wall 4500 cGy 25 sessions  . Hypertension   . Kidney stones     one stones  . Cataract     eye surgery planned for 11/2014  .  PVC's (premature ventricular contractions)   . Reflux esophagitis   . Pre-diabetes    Past Surgical History  Procedure Laterality Date  . Vesicovaginal fistula closure w/ tah  age 76  . Tubal ligation  1961  . Appendectomy    . Abdominal hysterectomy      in her 30's  . Small intestine surgery  1998    SBO  . Tonsillectomy    . Breast lumpectomy  2002    right breast  . Mastectomy  07/29/11    bilateral-rt nodes-none on left  . Cardiac catheterization  03/2011, 3/15  . Breast biopsy Right 01/21/2014    Procedure: BREAST/CHEST WALL BIOPSY;  Surgeon: [name], MD;  Location: [facility];  Service: General;  Laterality: Right;  . Vein ligation and stripping Right   . Left heart catheterization with coronary angiogram N/A 12/05/2013    Procedure: LEFT HEART CATHETERIZATION WITH CORONARY ANGIOGRAM;  Surgeon: [name], MD;  Location: [facility] CATH LAB;  Service: Cardiovascular;  Laterality: N/A;  . Left heart catheterization with coronary angiogram N/A 11/06/2014    Procedure: LEFT HEART CATHETERIZATION WITH CORONARY ANGIOGRAM;  Surgeon: [name], MD;  Location: [facility] CATH LAB;  Service: Cardiovascular;  Laterality: N/A;   Family History  Problem Relation Age of Onset  . Pancreatic cancer Mother   . Cancer Mother     pancreatic  .  Asthma Brother   . Asthma Daughter   . Stroke Daughter   . Asthma Grandchild   . Asthma Brother     deceased  . Heart disease Father   . Emphysema Father   . Heart disease Brother   . Lymphoma Brother    History  Substance Use Topics  . Smoking status: Never Smoker   . Smokeless tobacco: Never Used  . Alcohol Use: Yes     Comment: occ wine   OB History    No data available     Review of Systems  Constitutional: Negative for fever and fatigue.  HENT: Negative for congestion and drooling.   Eyes: Negative for pain.  Respiratory: Positive for cough and shortness of breath.   Cardiovascular: Positive for chest pain. Negative for  palpitations.  Gastrointestinal: Positive for nausea. Negative for vomiting, abdominal pain and diarrhea.  Genitourinary: Negative for dysuria and hematuria.  Musculoskeletal: Negative for back pain, gait problem and neck pain.  Skin: Negative for color change.  Neurological: Positive for headaches. Negative for dizziness.  Hematological: Negative for adenopathy.  Psychiatric/Behavioral: Negative for behavioral problems.  All other systems reviewed and are negative.     Allergies  Penicillins and Adhesive  Home Medications   Prior to Admission medications   Medication Sig Start Date End Date Taking? Authorizing Provider  albuterol (PROVENTIL HFA;VENTOLIN HFA) 108 (90 BASE) MCG/ACT inhaler Inhale into the lungs every 6 (six) hours as needed for wheezing or shortness of breath.   Yes Historical Provider, MD  aspirin EC 81 MG tablet Take 1 tablet (81 mg total) by mouth daily. 05/23/14  Yes [name], MD  B Complex-Biotin-FA (B-COMPLEX PO) Take 1 tablet by mouth daily.    Yes Historical Provider, MD  Biotin 5000 MCG CAPS Take 5,000 mcg by mouth daily.   Yes Historical Provider, MD  budesonide-formoterol (SYMBICORT) 160-4.5 MCG/ACT inhaler Inhale 2 puffs into the lungs 2 (two) times daily as needed. Patient taking differently: Inhale 2 puffs into the lungs 2 (two) times daily as needed (shortness of breath.).  08/27/14  Yes Unk Pinto, MD  Calcium-Magnesium-Zinc 1000-400-15 MG TABS Take 1 tablet by mouth daily.     Yes Historical Provider, MD  celecoxib (CELEBREX) 200 MG capsule Take 1 capsule daily with food for pain & inflammation 08/05/14  Yes Unk Pinto, MD  Cholecalciferol (VITAMIN D-3) 5000 UNITS TABS Take 1 tablet by mouth daily.    Yes Historical Provider, MD  DULoxetine (CYMBALTA) 60 MG capsule Take 1 capsule by mouth  every day for mood 04/02/14  Yes Unk Pinto, MD  ezetimibe (ZETIA) 10 MG tablet Take 10 mg by mouth daily.   Yes Historical Provider, MD  Fish Oil OIL  Take 1 capsule by mouth daily.    Yes Historical Provider, MD  Flaxseed, Linseed, (FLAX SEED OIL PO) Take 5 mLs by mouth daily. 1 tsp daily   Yes Historical Provider, MD  HYDROcodone-acetaminophen (NORCO) 10-325 MG per tablet Take 1 tablet by mouth every 6 (six) hours as needed for moderate pain.    Yes Historical Provider, MD  isosorbide mononitrate (IMDUR) 30 MG 24 hr tablet Take 1 tablet (30 mg total) by mouth daily. 10/25/14  Yes [name], MD  letrozole [place]) 2.5 MG tablet Take 1 tablet by mouth  daily 10/23/14  Yes [name], MD  Misc Natural Products (GLUCOSAMINE CHOND COMPLEX/MSM PO) Take 1 tablet by mouth daily.    Yes Historical Provider, MD  montelukast (SINGULAIR)  10 MG tablet Take 1 tablet (10 mg total) by mouth at bedtime. 04/15/14  Yes Unk Pinto, MD  Multiple Vitamin (MULTIVITAMIN) tablet Take 1 tablet by mouth daily.   Yes Historical Provider, MD  nitroGLYCERIN (NITROSTAT) 0.4 MG SL tablet Place 1 tablet (0.4 mg total) under the tongue every 5 (five) minutes x 3 doses as needed for chest pain. 01/19/14  Yes Rogelia Mire, NP  PREDNISOLONE ACETATE OP Place 1 drop into the right eye 2 (two) times daily.   Yes Historical Provider, MD   BP 110/70 mmHg  Pulse 72  Temp(Src) 97.8 F (36.6 C) (Oral)  Resp 20  SpO2 96% Physical Exam  Constitutional: She is oriented to person, place, and time. She appears well-developed and well-nourished.  HENT:  Head: Normocephalic and atraumatic.  Mouth/Throat: Oropharynx is clear and moist. No oropharyngeal exudate.  Eyes: Conjunctivae and EOM are normal. Pupils are equal, round, and reactive to light.  Neck: Normal range of motion. Neck supple.  Cardiovascular: Normal rate, regular rhythm, normal heart sounds and intact distal pulses.  Exam reveals no gallop and no friction rub.   No murmur heard. Pulmonary/Chest: Effort normal and breath sounds normal. No respiratory distress. She has no wheezes.  Abdominal: Soft. Bowel  sounds are normal. There is no tenderness. There is no rebound and no guarding.  Musculoskeletal: Normal range of motion. She exhibits no edema or tenderness.  Right shoulder pain reproducible with range of motion of the right shoulder.  2+ and equal proximal and distal pulses in the upper extremities.  Neurological: She is alert and oriented to person, place, and time.  alert, oriented x3 speech: normal in context and clarity memory: intact grossly cranial nerves II-XII: intact motor strength: full proximally and distally no involuntary movements or tremors sensation: intact to light touch diffusely  cerebellar: finger-to-nose and heel-to-shin intact gait: normal forwards and backwards  Skin: Skin is warm and dry.  Psychiatric: She has a normal mood and affect. Her behavior is normal.  Nursing note and vitals reviewed.   ED Course  Procedures (including critical care time) Labs Review Labs Reviewed  BASIC METABOLIC PANEL - Abnormal; Notable for the following:    GFR calc non Af Amer 78 (*)    All other components within normal limits  URINALYSIS, ROUTINE W REFLEX MICROSCOPIC - Abnormal; Notable for the following:    Specific Gravity, Urine 1.003 (*)    All other components within normal limits  CBC  BRAIN NATRIURETIC PEPTIDE  D-DIMER, QUANTITATIVE  Randolm Idol, ED    Imaging Review Dg Chest Port 1 View  12/06/2014   CLINICAL DATA:  Chest pain.  Breast cancer  EXAM: PORTABLE CHEST - 1 VIEW  COMPARISON:  01/20/2014  FINDINGS: The heart size and mediastinal contours are within normal limits. Both lungs are clear. The visualized skeletal structures are unremarkable. Chronic elevation right hemidiaphragm unchanged.  IMPRESSION: No active disease.   Electronically Signed   By: Franchot Gallo M.D.   On: 12/06/2014 13:40     EKG Interpretation None      Date: 12/06/2014  Rate: 62  Rhythm: normal sinus rhythm  QRS Axis: normal  Intervals: normal  ST/T Wave  abnormalities: normal  Conduction Disutrbances:none  Narrative Interpretation: No ST or T wave changes consistent with ischemia.    Old EKG Reviewed: unchanged   MDM   Final diagnoses:  Right shoulder pain    1:55 PM 81 y.o. female w hx of breast cancer status post bilateral  mastectomy and chest wall cancer on oral chemotherapy and status post radiation who was sent from the [facility] with multiple complaints. She was being seen at appointment today and was diaphoretic. She is placed on 2 L via nasal cannula seem to help. She states that she has not felt well over the last week and has had some mild shortness of breath, generalized weakness, nausea, and right shoulder pain. The right shoulder pain spreads to the middle of her back in her upper neck and is reproducible with movement of the arm. She has had this similar pain previously and has been lifting some different objects during the week which may have exacerbated it. She is afebrile and vital signs are unremarkable here. We'll get screening lab work and d-dimer. She also notes intermittent headaches over the last week but denies any headache currently. She has had some mild blurry vision but notes that she is clinically blind in her left eye and recently had surgery on her right eye.  Cardiac cath from Feb '16 - 50-70% mid LAD unchanged from prior angiograms dating back to 2012. Without evidence of ischemia this region is stable and wants no intervention. Especially given that her symptoms are very atypical.  3:37 PM: I interpreted/reviewed the labs and/or imaging which were non-contributory.  Pt cont to appear well. Shoulder pain reproducible and likely msk. Low risk for PE per wells, d-dimer neg. I have discussed the diagnosis/risks/treatment options with the patient and believe the pt to be eligible for discharge home to follow-up with her pcp. I provided some oxycodone for breakthrough pain as well as some Zofran for home. We also  discussed returning to the ED immediately if new or worsening sx occur. We discussed the sx which are most concerning (e.g., worsening pain, fever, worsening sob) that necessitate immediate return. Medications administered to the patient during their visit and any new prescriptions provided to the patient are listed below.  Medications given during this visit Medications  morphine 4 MG/ML injection 4 mg (4 mg Intravenous Given 12/06/14 1356)  ondansetron (ZOFRAN) injection 4 mg (4 mg Intravenous Given 12/06/14 1424)  sodium chloride 0.9 % bolus 500 mL (500 mLs Intravenous New Bag/Given 12/06/14 1424)    Discharge Medication List as of 12/06/2014  3:41 PM    START taking these medications   Details  ondansetron (ZOFRAN ODT) 4 MG disintegrating tablet Take 1 tablet (4 mg total) by mouth every 8 (eight) hours as needed for nausea or vomiting., Starting 12/06/2014, Until Discontinued, Print    oxyCODONE (ROXICODONE) 5 MG immediate release tablet Take 1 tablet (5 mg total) by mouth every 4 (four) hours as needed for breakthrough pain., Starting 12/06/2014, Until Discontinued, Print         Pamella Pert, MD 12/06/14 1556

## 2014-12-06 NOTE — ED Notes (Signed)
Bed: WA13 Expected date:  Expected time:  Means of arrival:  Comments: Cancer center 

## 2014-12-06 NOTE — Telephone Encounter (Signed)
Patient called stating that she has right sided chest pain when she is picking up objects off the floor or raising her arms. Patient has also developed red spots on the right side of her chest where she has previously had radiation. Selena Lesser NP was notified and an appointment was scheduled for 12/06/14. RN notified patient and she verbalized understanding.

## 2014-12-06 NOTE — ED Notes (Addendum)
Pt sent from cancer center. Pt reports SOB, weakness and nausea for the past week along with pain in R shoulder, back, chest and HA. Pt went into cancer center for appointment today and felt diaphoretic. Pt sent here for evaluation. Cancer center staff put pt on 2L [place] which helped relieve her symptoms. Pt being treated for breast cancer with mets to chest wall, not on chemo or radiation. Had cardiac cath 6 months ago. Hx of angina. Pt speaking in full sentences with no difficulty.

## 2014-12-06 NOTE — ED Notes (Signed)
Pt alert and oriented x4. Respirations even and unlabored, bilateral symmetrical rise and fall of chest. Skin warm and dry. In no acute distress. Denies needs.   

## 2014-12-07 ENCOUNTER — Encounter: Payer: Self-pay | Admitting: Nurse Practitioner

## 2014-12-07 NOTE — Assessment & Plan Note (Signed)
Pt is status post bilat mastectomy, radiation therapy, and is currently undergoing Letrozole therapy.  The plan was to initiate Palbociclib; but this regimen is on hold until patient is cleared thru cardiology and has her planned eye surgery thru [facility].   She is scheduled to return for labs on 01/15/15; and will return for a follow up visit on 01/22/15.

## 2014-12-07 NOTE — Assessment & Plan Note (Signed)
Pt underwent a cardiac cathaterization on 11/06/14.  She is now c/o chest pain, increased shortness of breath, and weakness for the past several days.  She reports increased SOB with any exertion.    O2 was placed via nasal cannulla. Patient was transferred to the Emergency Department for further evaluation and management via wheelchair by cancer center nurse. Brief hx and report was given to ED charge nurse prior to transfer to ED.

## 2014-12-07 NOTE — Progress Notes (Signed)
SYMPTOM MANAGEMENT CLINIC   HPI: Angel [name] 81 y.o. female diagnosed with breast cancer.  Pt is status post mastectomy and radiation therapy.  Currently undergoing Letrozole oral therapy.    Pt had planned to possibly initiate palbociclib therapy; but underwent a cardiac cath on 11/06/14.  She also plans on undergoing eye surgery per [facility] soon as well.   Pt is now reporting chest pain, and increased shortness of breath with any type of exertion.  Denies any specific pain with inspiration.   HPI  ROS  Past Medical History  Diagnosis Date  . Asthma   . Spinal stenosis   . Osteoporosis   . Chronic bronchitis   . DDD (degenerative disc disease)   . Osteoarthritis   . Degenerative joint disease   . Skin cancer of face     non melanoma  . Coronary artery disease Non-obstructive    a. 2012 Cath: LAD 40-57m>Med Rx;  b. 11/2013 Echo: EF 50-55%, nl wall motion;  c. 12/2013 Lexi MV: EF 74%, no ischemia.  . Depression   . Anxiety   . COPD (chronic obstructive pulmonary disease)   . Dyslipidemia   . Cholelithiasis   . GERD (gastroesophageal reflux disease)   . Hx of radiation therapy 07/02/1999- 08/11/1999    right supraclavucular/axillary region: 5040 cGy, 28 fractions  . Breast cancer 05/07/1999, 06/2011    a. s/p bilat mastectomies. recurrence 01/2014. Radiation, meds.  .Marland KitchenHx of radiation therapy 02/20/14- 03/27/14    right chest wall 4500 cGy 25 sessions  . Hypertension   . Kidney stones     one stones  . Cataract     eye surgery planned for 11/2014  . PVC's (premature ventricular contractions)   . Reflux esophagitis   . Pre-diabetes     Past Surgical History  Procedure Laterality Date  . Vesicovaginal fistula closure w/ tah  age 81 . Tubal ligation  1961  . Appendectomy    . Abdominal hysterectomy      in her 30's  . Small intestine surgery  1998    SBO  . Tonsillectomy    . Breast lumpectomy  2002    right breast  . Mastectomy  07/29/11     bilateral-rt nodes-none on left  . Cardiac catheterization  03/2011, 3/15  . Breast biopsy Right 01/21/2014    Procedure: BREAST/CHEST WALL BIOPSY;  Surgeon: ARalene Ok MD;  [facility]  Service: General;  Laterality: Right;  . Vein ligation and stripping Right   . Left heart catheterization with coronary angiogram N/A 12/05/2013    Procedure: LEFT HEART CATHETERIZATION WITH CORONARY ANGIOGRAM;  Surgeon: CBurnell Blanks MD;  [facility];  Service: Cardiovascular;  Laterality: N/A;  . Left heart catheterization with coronary angiogram N/A 11/06/2014    Procedure: LEFT HEART CATHETERIZATION WITH CORONARY ANGIOGRAM;  Surgeon: HSinclair Grooms MD;  [facility];  Service: Cardiovascular;  Laterality: N/A;    has Asthma; Osteoporosis; Osteoarthritis; Chronic pain; Kidney stones; DDD (degenerative disc disease), lumbar; Mixed hyperlipidemia; COPD; Vitamin D deficiency; Reflux esophagitis; PVC's (premature ventricular contractions); Nodule of chest wall, right; Essential hypertension; Prediabetes; Medication management; CAD (coronary artery disease); Breast cancer, right breast; Nausea; Suspicious nevus; Chest pain; and Dyslipidemia on her problem list.    is allergic to penicillins and adhesive.    Medication List       This list is accurate as of: 12/06/14 12:19 PM.  Always use your  most recent med list.               albuterol 108 (90 BASE) MCG/ACT inhaler  Commonly known as:  PROVENTIL HFA;VENTOLIN HFA  Inhale into the lungs every 6 (six) hours as needed for wheezing or shortness of breath.     aspirin EC 81 MG tablet  Take 1 tablet (81 mg total) by mouth daily.     B-COMPLEX PO  Take 1 tablet by mouth daily.     Biotin 5000 MCG Caps  Take 5,000 mcg by mouth daily.     budesonide-formoterol 160-4.5 MCG/ACT inhaler  Commonly known as:  SYMBICORT  Inhale 2 puffs into the lungs 2 (two) times daily as needed.     Calcium-Magnesium-Zinc  1000-400-15 MG Tabs  Take 1 tablet by mouth daily.     celecoxib 200 MG capsule  Commonly known as:  CELEBREX  Take 1 capsule daily with food for pain & inflammation     DULoxetine 60 MG capsule  Commonly known as:  CYMBALTA  Take 1 capsule by mouth  every day for mood     ezetimibe 10 MG tablet  Commonly known as:  ZETIA  Take 10 mg by mouth daily.     Fish Oil Oil  Take 1 capsule by mouth daily.     FLAX SEED OIL PO  Take 5 mLs by mouth daily. 1 tsp daily     GLUCOSAMINE CHOND COMPLEX/MSM PO  Take 1 tablet by mouth daily.     HYDROcodone-acetaminophen 10-325 MG per tablet  Commonly known as:  NORCO  Take 1 tablet by mouth every 6 (six) hours as needed for moderate pain.     isosorbide mononitrate 30 MG 24 hr tablet  Commonly known as:  IMDUR  Take 1 tablet (30 mg total) by mouth daily.     letrozole 2.5 MG tablet  Commonly known as:  FEMARA  Take 1 tablet by mouth  daily     montelukast 10 MG tablet  Commonly known as:  SINGULAIR  Take 1 tablet (10 mg total) by mouth at bedtime.     multivitamin tablet  Take 1 tablet by mouth daily.     nitroGLYCERIN 0.4 MG SL tablet  Commonly known as:  NITROSTAT  Place 1 tablet (0.4 mg total) under the tongue every 5 (five) minutes x 3 doses as needed for chest pain.     Vitamin D-3 5000 UNITS Tabs  Take 1 tablet by mouth daily.         PHYSICAL EXAMINATION  Oncology Vitals 12/06/2014 12/06/2014 12/06/2014 12/06/2014 12/06/2014 12/06/2014 12/06/2014  Height - - - - - - -  Weight - - - - - - -  Weight (lbs) - - - - - - -  BMI (kg/m2) - - - - - - -  Temp - - - - - - -  Pulse 70 67 71 62 76 70 72  Resp '22 16 22 18 20 16 20  ' Resp (Historical as of 04/27/12) - - - - - - -  SpO2 92 92 92 91 94 100 96  BSA (m2) - - - - - - -   BP Readings from Last 3 Encounters:  12/06/14 108/63  12/06/14 132/59  11/05/14 109/69    Physical Exam  Constitutional: She is oriented to person, place, and time. Vital signs are normal. She  appears unhealthy.  Pt appears fatigued, weak, pale, and chronically ill.   HENT:  Head: Normocephalic and  atraumatic.  Mouth/Throat: Oropharynx is clear and moist.  Eyes: Conjunctivae and EOM are normal. Pupils are equal, round, and reactive to light. Right eye exhibits no discharge. Left eye exhibits no discharge. No scleral icterus.  Neck: Normal range of motion. Neck supple. No JVD present. No tracheal deviation present. No thyromegaly present.  Cardiovascular:  On initial check- HR 48 and slightly irregular.   Bilat feet slightly cyanotic.   Pulmonary/Chest: Breath sounds normal. No respiratory distress. She has no wheezes. She has no rales. She exhibits no tenderness.  Mild SOB on exam.  Pt also slightly diaphoretic.   Abdominal: Soft. Bowel sounds are normal. She exhibits no distension and no mass. There is no tenderness. There is no rebound and no guarding.  Musculoskeletal: Normal range of motion. She exhibits no edema or tenderness.  Lymphadenopathy:    She has no cervical adenopathy.  Neurological: She is alert and oriented to person, place, and time. Gait normal.  Skin: Skin is warm. No rash noted. No erythema. There is pallor.  Psychiatric: Affect normal.  Nursing note and vitals reviewed.   LABORATORY DATA:. Admission on 12/06/2014, Discharged on 12/06/2014  Component Date Value Ref Range Status  . Troponin i, poc 12/06/2014 0.00  0.00 - 0.08 ng/mL Final  . Comment 3 12/06/2014          Final   Comment: Due to the release kinetics of cTnI, a negative result within the first hours of the onset of symptoms does not rule out myocardial infarction with certainty. If myocardial infarction is still suspected, repeat the test at appropriate intervals.   . WBC 12/06/2014 5.1  4.0 - 10.5 K/uL Final  . RBC 12/06/2014 4.33  3.87 - 5.11 MIL/uL Final  . Hemoglobin 12/06/2014 13.7  12.0 - 15.0 g/dL Final  . HCT 12/06/2014 41.4  36.0 - 46.0 % Final  . MCV 12/06/2014 95.6  78.0 -  100.0 fL Final  . MCH 12/06/2014 31.6  26.0 - 34.0 pg Final  . MCHC 12/06/2014 33.1  30.0 - 36.0 g/dL Final  . RDW 12/06/2014 12.7  11.5 - 15.5 % Final  . Platelets 12/06/2014 206  150 - 400 K/uL Final  . Sodium 12/06/2014 135  135 - 145 mmol/L Final  . Potassium 12/06/2014 3.8  3.5 - 5.1 mmol/L Final  . Chloride 12/06/2014 100  96 - 112 mmol/L Final  . CO2 12/06/2014 28  19 - 32 mmol/L Final  . Glucose, Bld 12/06/2014 97  70 - 99 mg/dL Final  . BUN 12/06/2014 14  6 - 23 mg/dL Final  . Creatinine, Ser 12/06/2014 0.74  0.50 - 1.10 mg/dL Final  . Calcium 12/06/2014 9.0  8.4 - 10.5 mg/dL Final  . GFR calc non Af Amer 12/06/2014 78* >90 mL/min Final  . GFR calc Af Amer 12/06/2014 >90  >90 mL/min Final   Comment: (NOTE) The eGFR has been calculated using the CKD EPI equation. This calculation has not been validated in all clinical situations. eGFR's persistently <90 mL/min signify possible Chronic Kidney Disease.   . Anion gap 12/06/2014 7  5 - 15 Final  . B Natriuretic Peptide 12/06/2014 95.4  0.0 - 100.0 pg/mL Final  . D-Dimer, Quant 12/06/2014 0.35  0.00 - 0.48 ug/mL-FEU Final   Comment:        AT THE INHOUSE ESTABLISHED CUTOFF VALUE OF 0.48 ug/mL FEU, THIS ASSAY HAS BEEN DOCUMENTED IN THE LITERATURE TO HAVE A SENSITIVITY AND NEGATIVE PREDICTIVE VALUE OF AT LEAST 98 TO  99%.  THE TEST RESULT SHOULD BE CORRELATED WITH AN ASSESSMENT OF THE CLINICAL PROBABILITY OF DVT / VTE.   [name] Color, Urine 12/06/2014 YELLOW  YELLOW Final  . APPearance 12/06/2014 CLEAR  CLEAR Final  . Specific Gravity, Urine 12/06/2014 1.003* 1.005 - 1.030 Final  . pH 12/06/2014 6.0  5.0 - 8.0 Final  . Glucose, UA 12/06/2014 NEGATIVE  NEGATIVE mg/dL Final  . Hgb urine dipstick 12/06/2014 NEGATIVE  NEGATIVE Final  . Bilirubin Urine 12/06/2014 NEGATIVE  NEGATIVE Final  . Ketones, ur 12/06/2014 NEGATIVE  NEGATIVE mg/dL Final  . Protein, ur 12/06/2014 NEGATIVE  NEGATIVE mg/dL Final  . Urobilinogen, UA 12/06/2014  0.2  0.0 - 1.0 mg/dL Final  . Nitrite 12/06/2014 NEGATIVE  NEGATIVE Final  . Leukocytes, UA 12/06/2014 NEGATIVE  NEGATIVE Final   MICROSCOPIC NOT DONE ON URINES WITH NEGATIVE PROTEIN, BLOOD, LEUKOCYTES, NITRITE, OR GLUCOSE <1000 mg/dL.     RADIOGRAPHIC STUDIES: Dg Chest Port 1 View  12/06/2014   CLINICAL DATA:  Chest pain.  Breast cancer  EXAM: PORTABLE CHEST - 1 VIEW  COMPARISON:  01/20/2014  FINDINGS: The heart size and mediastinal contours are within normal limits. Both lungs are clear. The visualized skeletal structures are unremarkable. Chronic elevation right hemidiaphragm unchanged.  IMPRESSION: No active disease.   Electronically Signed   By: Franchot Gallo M.D.   On: 12/06/2014 13:40    ASSESSMENT/PLAN:    Breast cancer, right breast Pt is status post bilat mastectomy, radiation therapy, and is currently undergoing Letrozole therapy.  The plan was to initiate Palbociclib; but this regimen is on hold until patient is cleared thru cardiology and has her planned eye surgery thru [facility].   She is scheduled to return for labs on 01/15/15; and will return for a follow up visit on 01/22/15.     Chest pain Pt underwent a cardiac cathaterization on 11/06/14.  She is now c/o chest pain, increased shortness of breath, and weakness for the past several days.  She reports increased SOB with any exertion.    O2 was placed via nasal cannulla. Patient was transferred to the Emergency Department for further evaluation and management via wheelchair by cancer center nurse. Brief hx and report was given to ED charge nurse prior to transfer to ED.    Patient stated understanding of all instructions; and was in agreement with this plan of care. The patient knows to call the clinic with any problems, questions or concerns.   Review/collaboration with Dr. Jana Hakim regarding all aspects of patient's visit today.   Total time spent with patient was 25 minutes;  with greater than 75  percent of that time spent in face to face counseling regarding patient's symptoms,  and coordination of care and follow up.  Disclaimer: This note was dictated with voice recognition software. Similar sounding words can inadvertently be transcribed and may not be corrected upon review.   Drue Second, NP 12/07/2014

## 2014-12-10 ENCOUNTER — Telehealth: Payer: Self-pay

## 2014-12-10 DIAGNOSIS — C50911 Malignant neoplasm of unspecified site of right female breast: Secondary | ICD-10-CM

## 2014-12-10 DIAGNOSIS — G8929 Other chronic pain: Secondary | ICD-10-CM

## 2014-12-10 MED ORDER — DIAZEPAM 5 MG PO TABS: 2.5000 mg | ORAL_TABLET | Freq: Three times a day (TID) | ORAL | Status: DC | PRN

## 2014-12-10 NOTE — Telephone Encounter (Signed)
R arm yesterday could not move her arm. But it is a bit better today. She asked for a refill on her valium. Selena Lesser NP said we can refill #45 to give pt time to get settled with her new PCP. Encouraged pt to go to PCP or rheumatologist if arm continues to pain her. Pt was receptive to this.

## 2014-12-11 ENCOUNTER — Telehealth: Payer: Self-pay | Admitting: Family Medicine

## 2014-12-11 NOTE — Telephone Encounter (Signed)
Caller name: [name], [name] Relation to pt: self  Call back number: [phone] Pharmacy: [facility], [place] [phone] (Phone) [phone] (Fax)         Reason for call:  Pt is requesting a refll celecoxib (CELEBREX) 200 MG capsule

## 2014-12-12 NOTE — Telephone Encounter (Signed)
Last seen 10/24/14 and the Rx was filled by Unk Pinto, MD on 08/15/14 #90   Please advise    KP

## 2014-12-13 ENCOUNTER — Observation Stay (HOSPITAL_COMMUNITY)
Admission: EM | Admit: 2014-12-13 | Discharge: 2014-12-16 | Disposition: A | Discharge status: Home / Self Care | Payer: Medicare Other | Attending: Internal Medicine | Admitting: Internal Medicine

## 2014-12-13 ENCOUNTER — Telehealth: Payer: Self-pay | Admitting: *Deleted

## 2014-12-13 ENCOUNTER — Encounter (HOSPITAL_COMMUNITY): Payer: Self-pay | Admitting: *Deleted

## 2014-12-13 ENCOUNTER — Emergency Department (HOSPITAL_COMMUNITY): Payer: Medicare Other

## 2014-12-13 ENCOUNTER — Telehealth: Payer: Self-pay

## 2014-12-13 ENCOUNTER — Other Ambulatory Visit: Payer: Self-pay | Admitting: *Deleted

## 2014-12-13 DIAGNOSIS — Z85828 Personal history of other malignant neoplasm of skin: Secondary | ICD-10-CM | POA: Diagnosis not present

## 2014-12-13 DIAGNOSIS — K8020 Calculus of gallbladder without cholecystitis without obstruction: Secondary | ICD-10-CM | POA: Diagnosis not present

## 2014-12-13 DIAGNOSIS — E782 Mixed hyperlipidemia: Secondary | ICD-10-CM | POA: Diagnosis not present

## 2014-12-13 DIAGNOSIS — R0602 Shortness of breath: Secondary | ICD-10-CM | POA: Insufficient documentation

## 2014-12-13 DIAGNOSIS — Z7982 Long term (current) use of aspirin: Secondary | ICD-10-CM | POA: Diagnosis not present

## 2014-12-13 DIAGNOSIS — Z79899 Other long term (current) drug therapy: Secondary | ICD-10-CM | POA: Insufficient documentation

## 2014-12-13 DIAGNOSIS — I493 Ventricular premature depolarization: Secondary | ICD-10-CM | POA: Diagnosis not present

## 2014-12-13 DIAGNOSIS — M4800 Spinal stenosis, site unspecified: Secondary | ICD-10-CM | POA: Diagnosis not present

## 2014-12-13 DIAGNOSIS — H269 Unspecified cataract: Secondary | ICD-10-CM | POA: Insufficient documentation

## 2014-12-13 DIAGNOSIS — F32A Depression, unspecified: Secondary | ICD-10-CM

## 2014-12-13 DIAGNOSIS — R079 Chest pain, unspecified: Secondary | ICD-10-CM

## 2014-12-13 DIAGNOSIS — R0789 Other chest pain: Principal | ICD-10-CM | POA: Insufficient documentation

## 2014-12-13 DIAGNOSIS — Z880 Allergy status to penicillin: Secondary | ICD-10-CM | POA: Diagnosis not present

## 2014-12-13 DIAGNOSIS — J441 Chronic obstructive pulmonary disease with (acute) exacerbation: Secondary | ICD-10-CM | POA: Insufficient documentation

## 2014-12-13 DIAGNOSIS — R222 Localized swelling, mass and lump, trunk: Secondary | ICD-10-CM

## 2014-12-13 DIAGNOSIS — C50911 Malignant neoplasm of unspecified site of right female breast: Secondary | ICD-10-CM

## 2014-12-13 DIAGNOSIS — I700 Atherosclerosis of aorta: Secondary | ICD-10-CM | POA: Diagnosis not present

## 2014-12-13 DIAGNOSIS — M1990 Unspecified osteoarthritis, unspecified site: Secondary | ICD-10-CM | POA: Diagnosis not present

## 2014-12-13 DIAGNOSIS — J45909 Unspecified asthma, uncomplicated: Secondary | ICD-10-CM | POA: Diagnosis present

## 2014-12-13 DIAGNOSIS — F329 Major depressive disorder, single episode, unspecified: Secondary | ICD-10-CM | POA: Diagnosis not present

## 2014-12-13 DIAGNOSIS — I2510 Atherosclerotic heart disease of native coronary artery without angina pectoris: Secondary | ICD-10-CM | POA: Insufficient documentation

## 2014-12-13 DIAGNOSIS — K219 Gastro-esophageal reflux disease without esophagitis: Secondary | ICD-10-CM | POA: Diagnosis not present

## 2014-12-13 DIAGNOSIS — I10 Essential (primary) hypertension: Secondary | ICD-10-CM | POA: Diagnosis not present

## 2014-12-13 DIAGNOSIS — E785 Hyperlipidemia, unspecified: Secondary | ICD-10-CM | POA: Insufficient documentation

## 2014-12-13 DIAGNOSIS — Z853 Personal history of malignant neoplasm of breast: Secondary | ICD-10-CM | POA: Insufficient documentation

## 2014-12-13 DIAGNOSIS — N200 Calculus of kidney: Secondary | ICD-10-CM | POA: Insufficient documentation

## 2014-12-13 DIAGNOSIS — M810 Age-related osteoporosis without current pathological fracture: Secondary | ICD-10-CM | POA: Diagnosis not present

## 2014-12-13 DIAGNOSIS — K210 Gastro-esophageal reflux disease with esophagitis: Secondary | ICD-10-CM | POA: Diagnosis not present

## 2014-12-13 DIAGNOSIS — F419 Anxiety disorder, unspecified: Secondary | ICD-10-CM | POA: Diagnosis not present

## 2014-12-13 DIAGNOSIS — R071 Chest pain on breathing: Secondary | ICD-10-CM

## 2014-12-13 DIAGNOSIS — R7303 Prediabetes: Secondary | ICD-10-CM | POA: Diagnosis present

## 2014-12-13 DIAGNOSIS — M5136 Other intervertebral disc degeneration, lumbar region: Secondary | ICD-10-CM | POA: Diagnosis present

## 2014-12-13 LAB — LIPASE, BLOOD: Lipase: 23 U/L (ref 11–59)

## 2014-12-13 LAB — CBC
HEMATOCRIT: 40.5 % (ref 36.0–46.0)
Hemoglobin: 13.5 g/dL (ref 12.0–15.0)
MCH: 31.5 pg (ref 26.0–34.0)
MCHC: 33.3 g/dL (ref 30.0–36.0)
MCV: 94.6 fL (ref 78.0–100.0)
Platelets: 218 10*3/uL (ref 150–400)
RBC: 4.28 MIL/uL (ref 3.87–5.11)
RDW: 12.7 % (ref 11.5–15.5)
WBC: 7.1 10*3/uL (ref 4.0–10.5)

## 2014-12-13 LAB — RAPID URINE DRUG SCREEN, HOSP PERFORMED
AMPHETAMINES: NOT DETECTED
BARBITURATES: NOT DETECTED
BENZODIAZEPINES: POSITIVE — AB
COCAINE: NOT DETECTED
Opiates: POSITIVE — AB
TETRAHYDROCANNABINOL: NOT DETECTED

## 2014-12-13 LAB — I-STAT TROPONIN, ED: Troponin i, poc: 0 ng/mL (ref 0.00–0.08)

## 2014-12-13 LAB — HEPATIC FUNCTION PANEL
ALBUMIN: 3.9 g/dL (ref 3.5–5.2)
ALT: 14 U/L (ref 0–35)
AST: 23 U/L (ref 0–37)
Alkaline Phosphatase: 48 U/L (ref 39–117)
Bilirubin, Direct: 0.2 mg/dL (ref 0.0–0.5)
Indirect Bilirubin: 0.2 mg/dL — ABNORMAL LOW (ref 0.3–0.9)
TOTAL PROTEIN: 6.8 g/dL (ref 6.0–8.3)
Total Bilirubin: 0.4 mg/dL (ref 0.3–1.2)

## 2014-12-13 LAB — BASIC METABOLIC PANEL
ANION GAP: 6 (ref 5–15)
BUN: 15 mg/dL (ref 6–23)
CHLORIDE: 104 mmol/L (ref 96–112)
CO2: 27 mmol/L (ref 19–32)
Calcium: 9.7 mg/dL (ref 8.4–10.5)
Creatinine, Ser: 0.76 mg/dL (ref 0.50–1.10)
GFR, EST AFRICAN AMERICAN: 90 mL/min — AB (ref >90)
GFR, EST NON AFRICAN AMERICAN: 78 mL/min — AB (ref >90)
Glucose, Bld: 138 mg/dL — ABNORMAL HIGH (ref 70–99)
Potassium: 3.8 mmol/L (ref 3.5–5.1)
SODIUM: 137 mmol/L (ref 135–145)

## 2014-12-13 LAB — BRAIN NATRIURETIC PEPTIDE: B NATRIURETIC PEPTIDE 5: 61.6 pg/mL (ref 0.0–100.0)

## 2014-12-13 LAB — TROPONIN I

## 2014-12-13 LAB — PROTIME-INR
INR: 1.05 (ref 0.00–1.49)
Prothrombin Time: 13.8 seconds (ref 11.6–15.2)

## 2014-12-13 LAB — APTT: aPTT: 28 seconds (ref 24–37)

## 2014-12-13 MED ORDER — BUDESONIDE-FORMOTEROL FUMARATE 160-4.5 MCG/ACT IN AERO: 2.0000 | INHALATION_SPRAY | Freq: Two times a day (BID) | RESPIRATORY_TRACT | Status: DC | PRN

## 2014-12-13 MED ORDER — DULOXETINE HCL 60 MG PO CPEP
60.0000 mg | ORAL_CAPSULE | Freq: Every day | ORAL | Status: DC
  Administered 2014-12-14 – 2014-12-16 (×3): 60 mg via ORAL
  Filled 2014-12-13 (×3): qty 1

## 2014-12-13 MED ORDER — ACETAMINOPHEN 650 MG RE SUPP: 650.0000 mg | Freq: Four times a day (QID) | RECTAL | Status: DC | PRN

## 2014-12-13 MED ORDER — MONTELUKAST SODIUM 10 MG PO TABS
10.0000 mg | ORAL_TABLET | Freq: Every day | ORAL | Status: DC
  Administered 2014-12-13 – 2014-12-15 (×3): 10 mg via ORAL
  Filled 2014-12-13 (×3): qty 1

## 2014-12-13 MED ORDER — HYDROCODONE-ACETAMINOPHEN 10-325 MG PO TABS
1.0000 | ORAL_TABLET | Freq: Four times a day (QID) | ORAL | Status: DC | PRN
  Administered 2014-12-14 – 2014-12-15 (×2): 1 via ORAL
  Filled 2014-12-13 (×2): qty 1

## 2014-12-13 MED ORDER — OMEGA-3-ACID ETHYL ESTERS 1 G PO CAPS
1.0000 g | ORAL_CAPSULE | Freq: Every day | ORAL | Status: DC
  Administered 2014-12-13 – 2014-12-16 (×4): 1 g via ORAL
  Filled 2014-12-13 (×4): qty 1

## 2014-12-13 MED ORDER — ALBUTEROL SULFATE (2.5 MG/3ML) 0.083% IN NEBU: 3.0000 mL | INHALATION_SOLUTION | Freq: Four times a day (QID) | RESPIRATORY_TRACT | Status: DC | PRN

## 2014-12-13 MED ORDER — LETROZOLE 2.5 MG PO TABS
2.5000 mg | ORAL_TABLET | Freq: Every day | ORAL | Status: DC
  Administered 2014-12-14 – 2014-12-15 (×2): 2.5 mg via ORAL
  Filled 2014-12-13 (×3): qty 1

## 2014-12-13 MED ORDER — MORPHINE SULFATE 4 MG/ML IJ SOLN
4.0000 mg | Freq: Once | INTRAMUSCULAR | Status: AC
  Administered 2014-12-13: 4 mg via INTRAVENOUS
  Filled 2014-12-13: qty 1

## 2014-12-13 MED ORDER — VITAMIN D3 25 MCG (1000 UNIT) PO TABS
5000.0000 [IU] | ORAL_TABLET | Freq: Every day | ORAL | Status: DC
  Administered 2014-12-14 – 2014-12-16 (×3): 5000 [IU] via ORAL
  Filled 2014-12-13 (×6): qty 5

## 2014-12-13 MED ORDER — PANTOPRAZOLE SODIUM 40 MG PO TBEC
40.0000 mg | DELAYED_RELEASE_TABLET | Freq: Every day | ORAL | Status: DC
  Administered 2014-12-13 – 2014-12-16 (×3): 40 mg via ORAL
  Filled 2014-12-13 (×4): qty 1

## 2014-12-13 MED ORDER — FLAX SEED OIL 1000 MG PO CAPS: 1.0000 | ORAL_CAPSULE | Freq: Every day | ORAL | Status: DC

## 2014-12-13 MED ORDER — GLUCOSAMINE CHOND COMPLEX/MSM PO TABS: 1.0000 | ORAL_TABLET | Freq: Every day | ORAL | Status: DC

## 2014-12-13 MED ORDER — ACETAMINOPHEN 325 MG PO TABS: 650.0000 mg | ORAL_TABLET | Freq: Four times a day (QID) | ORAL | Status: DC | PRN

## 2014-12-13 MED ORDER — ASPIRIN EC 81 MG PO TBEC
81.0000 mg | DELAYED_RELEASE_TABLET | Freq: Every day | ORAL | Status: DC
  Administered 2014-12-14 – 2014-12-16 (×3): 81 mg via ORAL
  Filled 2014-12-13 (×3): qty 1

## 2014-12-13 MED ORDER — GABAPENTIN 600 MG PO TABS
300.0000 mg | ORAL_TABLET | Freq: Three times a day (TID) | ORAL | Status: DC
  Administered 2014-12-13 – 2014-12-16 (×9): 300 mg via ORAL
  Filled 2014-12-13 (×9): qty 1

## 2014-12-13 MED ORDER — CELECOXIB 200 MG PO CAPS: ORAL_CAPSULE | ORAL | Status: DC

## 2014-12-13 MED ORDER — ALUM & MAG HYDROXIDE-SIMETH 200-200-20 MG/5ML PO SUSP: 30.0000 mL | Freq: Four times a day (QID) | ORAL | Status: DC | PRN

## 2014-12-13 MED ORDER — CALCIUM-MAGNESIUM-ZINC 1000-400-15 MG PO TABS: 1.0000 | ORAL_TABLET | Freq: Every day | ORAL | Status: DC

## 2014-12-13 MED ORDER — SODIUM CHLORIDE 0.9 % IV SOLN
INTRAVENOUS | Status: DC
  Administered 2014-12-13 – 2014-12-14 (×2): via INTRAVENOUS

## 2014-12-13 MED ORDER — SODIUM CHLORIDE 0.9 % IJ SOLN
3.0000 mL | Freq: Two times a day (BID) | INTRAMUSCULAR | Status: DC
  Administered 2014-12-13 – 2014-12-16 (×6): 3 mL via INTRAVENOUS

## 2014-12-13 MED ORDER — ISOSORBIDE MONONITRATE ER 30 MG PO TB24
30.0000 mg | ORAL_TABLET | Freq: Every day | ORAL | Status: DC
  Administered 2014-12-16: 30 mg via ORAL
  Filled 2014-12-13 (×3): qty 1

## 2014-12-13 MED ORDER — ADULT MULTIVITAMIN W/MINERALS CH
1.0000 | ORAL_TABLET | Freq: Every day | ORAL | Status: DC
  Administered 2014-12-14 – 2014-12-16 (×3): 1 via ORAL
  Filled 2014-12-13 (×7): qty 1

## 2014-12-13 MED ORDER — CELECOXIB 100 MG PO CAPS
100.0000 mg | ORAL_CAPSULE | Freq: Every day | ORAL | Status: DC
  Administered 2014-12-14 – 2014-12-15 (×2): 100 mg via ORAL
  Filled 2014-12-13 (×3): qty 1

## 2014-12-13 MED ORDER — IOHEXOL 350 MG/ML SOLN: 80.0000 mL | Freq: Once | INTRAVENOUS | Status: AC | PRN

## 2014-12-13 MED ORDER — NITROGLYCERIN 0.4 MG SL SUBL: 0.4000 mg | SUBLINGUAL_TABLET | SUBLINGUAL | Status: DC | PRN

## 2014-12-13 MED ORDER — B COMPLEX-C PO TABS
1.0000 | ORAL_TABLET | Freq: Every day | ORAL | Status: DC
  Administered 2014-12-14 – 2014-12-16 (×3): 1 via ORAL
  Filled 2014-12-13 (×3): qty 1

## 2014-12-13 MED ORDER — HYDROMORPHONE HCL 1 MG/ML IJ SOLN
1.0000 mg | Freq: Once | INTRAMUSCULAR | Status: AC
  Administered 2014-12-13: 1 mg via INTRAVENOUS
  Filled 2014-12-13: qty 1

## 2014-12-13 MED ORDER — B-COMPLEX PO TABS: 1.0000 | ORAL_TABLET | Freq: Every day | ORAL | Status: DC

## 2014-12-13 MED ORDER — DIAZEPAM 5 MG PO TABS
2.5000 mg | ORAL_TABLET | Freq: Three times a day (TID) | ORAL | Status: DC | PRN
  Administered 2014-12-13 – 2014-12-14 (×2): 5 mg via ORAL
  Filled 2014-12-13 (×2): qty 1

## 2014-12-13 MED ORDER — ONDANSETRON 4 MG PO TBDP
4.0000 mg | ORAL_TABLET | Freq: Three times a day (TID) | ORAL | Status: DC | PRN
  Filled 2014-12-13: qty 1

## 2014-12-13 MED ORDER — FISH OIL OIL: 1.0000 | TOPICAL_OIL | Freq: Every day | Status: DC

## 2014-12-13 MED ORDER — MORPHINE SULFATE 2 MG/ML IJ SOLN
2.0000 mg | INTRAMUSCULAR | Status: DC | PRN
  Administered 2014-12-13 – 2014-12-14 (×4): 2 mg via INTRAVENOUS
  Filled 2014-12-13 (×4): qty 1

## 2014-12-13 MED ORDER — EZETIMIBE 10 MG PO TABS
10.0000 mg | ORAL_TABLET | Freq: Every day | ORAL | Status: DC
  Administered 2014-12-13 – 2014-12-15 (×3): 10 mg via ORAL
  Filled 2014-12-13 (×3): qty 1

## 2014-12-13 MED ORDER — HEPARIN SODIUM (PORCINE) 5000 UNIT/ML IJ SOLN
5000.0000 [IU] | Freq: Three times a day (TID) | INTRAMUSCULAR | Status: DC
  Administered 2014-12-13 – 2014-12-16 (×9): 5000 [IU] via SUBCUTANEOUS
  Filled 2014-12-13 (×7): qty 1

## 2014-12-13 NOTE — ED Notes (Signed)
Transporting patient to new room assignment. 

## 2014-12-13 NOTE — ED Provider Notes (Signed)
CSN: [id number]     Arrival date & time 12/13/14  1450 History   First MD Initiated Contact with Patient 12/13/14 1603     Chief Complaint  Patient presents with  . Chest Pain     (Consider location/radiation/quality/duration/timing/severity/associated sxs/prior Treatment) The history is provided by the patient.  Angel [name] is a 81 y.o. female hx of asthma, osteoporosis, CAD with recent cath, breast cancer in remission, here presenting with chest pain, shortness of breath, back pain. She's been having above symptoms for the last week or so. She has chronic pain from her arthritis and is on Vicodin at baseline. She came to the ER a week ago and had negative d-dimer as well as normal labs. She had a cath in February that shows 50-70% LAD lesion that is unchanged and patient was thought not to have cardiac chest pain. Patient was prescribed oxycodone which has not helped her with the pain. Pain got worse last night and she was unable to sleep because the pain. Denies any fevers or chills or cough.   Past Medical History  Diagnosis Date  . Asthma   . Spinal stenosis   . Osteoporosis   . Chronic bronchitis   . DDD (degenerative disc disease)   . Osteoarthritis   . Degenerative joint disease   . Skin cancer of face     non melanoma  . Coronary artery disease Non-obstructive    a. 2012 Cath: LAD 40-39m->Med Rx;  b. 11/2013 Echo: EF 50-55%, nl wall motion;  c. 12/2013 Lexi MV: EF 74%, no ischemia.  . Depression   . Anxiety   . COPD (chronic obstructive pulmonary disease)   . Dyslipidemia   . Cholelithiasis   . GERD (gastroesophageal reflux disease)   . Hx of radiation therapy 07/02/1999- 08/11/1999    right supraclavucular/axillary region: 5040 cGy, 28 fractions  . Breast cancer 05/07/1999, 06/2011    a. s/p bilat mastectomies. recurrence 01/2014. Radiation, meds.  [name] Hx of radiation therapy 02/20/14- 03/27/14    right chest wall 4500 cGy 25 sessions  . Hypertension   . Kidney stones      one stones  . Cataract     eye surgery planned for 11/2014  . PVC's (premature ventricular contractions)   . Reflux esophagitis   . Pre-diabetes    Past Surgical History  Procedure Laterality Date  . Vesicovaginal fistula closure w/ tah  age 73  . Tubal ligation  1961  . Appendectomy    . Abdominal hysterectomy      in her 30's  . Small intestine surgery  1998    [facility]  . Tonsillectomy    . Breast lumpectomy  2002    right breast  . Mastectomy  07/29/11    bilateral-rt nodes-none on left  . Cardiac catheterization  03/2011, 3/15  . Breast biopsy Right 01/21/2014    Procedure: BREAST/CHEST WALL BIOPSY;  Surgeon: [name], MD;  Location: [place];  Service: General;  Laterality: Right;  . Vein ligation and stripping Right   . Left heart catheterization with coronary angiogram N/A 12/05/2013    Procedure: LEFT HEART CATHETERIZATION WITH CORONARY ANGIOGRAM;  Surgeon: [name], MD;  Location: [facility] CATH LAB;  Service: Cardiovascular;  Laterality: N/A;  . Left heart catheterization with coronary angiogram N/A 11/06/2014    Procedure: LEFT HEART CATHETERIZATION WITH CORONARY ANGIOGRAM;  Surgeon: [name], MD;  Location: [facility] CATH LAB;  Service: Cardiovascular;  Laterality: N/A;   Family History  Problem Relation Age of Onset  . Pancreatic cancer Mother   . Cancer Mother     pancreatic  . Asthma Brother   . Asthma Daughter   . Stroke Daughter   . Asthma Grandchild   . Asthma Brother     deceased  . Heart disease Father   . Emphysema Father   . Heart disease Brother   . Lymphoma Brother    History  Substance Use Topics  . Smoking status: Never Smoker   . Smokeless tobacco: Never Used  . Alcohol Use: Yes     Comment: occ wine   OB History    No data available     Review of Systems  Respiratory: Positive for shortness of breath.   Cardiovascular: Positive for chest pain.  All other systems reviewed and are negative.     Allergies  Penicillins  and Adhesive  Home Medications   Prior to Admission medications   Medication Sig Start Date End Date Taking? Authorizing Provider  albuterol (PROVENTIL HFA;VENTOLIN HFA) 108 (90 BASE) MCG/ACT inhaler Inhale into the lungs every 6 (six) hours as needed for wheezing or shortness of breath.   Yes Historical Provider, MD  aspirin EC 81 MG tablet Take 1 tablet (81 mg total) by mouth daily. 05/23/14  Yes [name], MD  B Complex-Biotin-FA (B-COMPLEX PO) Take 1 tablet by mouth daily.    Yes Historical Provider, MD  Biotin 5000 MCG CAPS Take 5,000 mcg by mouth daily.   Yes Historical Provider, MD  budesonide-formoterol (SYMBICORT) 160-4.5 MCG/ACT inhaler Inhale 2 puffs into the lungs 2 (two) times daily as needed. Patient taking differently: Inhale 2 puffs into the lungs 2 (two) times daily as needed (shortness of breath.).  08/27/14  Yes Unk Pinto, MD  Calcium-Magnesium-Zinc 1000-400-15 MG TABS Take 1 tablet by mouth daily.     Yes Historical Provider, MD  celecoxib (CELEBREX) 200 MG capsule Take 1 capsule daily with food for pain & inflammation 12/13/14  Yes [name], DO  Cholecalciferol (VITAMIN D-3) 5000 UNITS TABS Take 1 tablet by mouth daily.    Yes Historical Provider, MD  diazepam (VALIUM) 5 MG tablet Take 0.5-1 tablets (2.5-5 mg total) by mouth every 8 (eight) hours as needed for anxiety. 12/10/14  Yes Susanne Borders, NP  DULoxetine (CYMBALTA) 60 MG capsule Take 1 capsule by mouth  every day for mood 04/02/14  Yes Unk Pinto, MD  ezetimibe (ZETIA) 10 MG tablet Take 10 mg by mouth daily.   Yes Historical Provider, MD  Fish Oil OIL Take 1 capsule by mouth daily.    Yes Historical Provider, MD  Flaxseed, Linseed, (FLAX SEED OIL PO) Take 5 mLs by mouth daily. 1 tsp daily   Yes Historical Provider, MD  HYDROcodone-acetaminophen (NORCO) 10-325 MG per tablet Take 1 tablet by mouth every 6 (six) hours as needed for moderate pain.    Yes Historical Provider, MD  isosorbide mononitrate  (IMDUR) 30 MG 24 hr tablet Take 1 tablet (30 mg total) by mouth daily. 10/25/14  Yes [name], MD  letrozole [facility]) 2.5 MG tablet Take 1 tablet by mouth  daily 10/23/14  Yes [name], MD  Misc Natural Products (GLUCOSAMINE CHOND COMPLEX/MSM PO) Take 1 tablet by mouth daily.    Yes Historical Provider, MD  montelukast (SINGULAIR) 10 MG tablet Take 1 tablet (10 mg total) by mouth at bedtime. 04/15/14  Yes Unk Pinto, MD  Multiple Vitamin (MULTIVITAMIN) tablet Take 1 tablet by  mouth daily.   Yes Historical Provider, MD  nitroGLYCERIN (NITROSTAT) 0.4 MG SL tablet Place 1 tablet (0.4 mg total) under the tongue every 5 (five) minutes x 3 doses as needed for chest pain. 01/19/14  Yes Rogelia Mire, NP  ondansetron (ZOFRAN ODT) 4 MG disintegrating tablet Take 1 tablet (4 mg total) by mouth every 8 (eight) hours as needed for nausea or vomiting. 12/06/14  Yes Pamella Pert, MD  oxyCODONE (ROXICODONE) 5 MG immediate release tablet Take 1 tablet (5 mg total) by mouth every 4 (four) hours as needed for breakthrough pain. 12/06/14  Yes Pamella Pert, MD   BP 156/69 mmHg  Pulse 72  Temp(Src) 97.5 F (36.4 C) (Oral)  Resp 21  SpO2 95% Physical Exam  Constitutional: She is oriented to person, place, and time.  Chronically ill, uncomfortable   HENT:  Head: Normocephalic.  Mouth/Throat: Oropharynx is clear and moist.  Eyes: Conjunctivae are normal. Pupils are equal, round, and reactive to light.  Neck: Normal range of motion. Neck supple.  Cardiovascular: Normal rate, regular rhythm and normal heart sounds.   Pulmonary/Chest: Effort normal and breath sounds normal. No respiratory distress. She has no wheezes. She has no rales.  Diffuse R sided chest tenderness   Abdominal: Soft. Bowel sounds are normal. She exhibits no distension. There is no tenderness. There is no rebound and no guarding.  Musculoskeletal: Normal range of motion. She exhibits no edema or tenderness.   Neurological: She is alert and oriented to person, place, and time. No cranial nerve deficit. Coordination normal.  Skin: Skin is warm and dry.  Psychiatric: She has a normal mood and affect. Her behavior is normal. Judgment and thought content normal.  Nursing note and vitals reviewed.   ED Course  Procedures (including critical care time) Labs Review Labs Reviewed  BASIC METABOLIC PANEL - Abnormal; Notable for the following:    Glucose, Bld 138 (*)    GFR calc non Af Amer 78 (*)    GFR calc Af Amer 90 (*)    All other components within normal limits  HEPATIC FUNCTION PANEL - Abnormal; Notable for the following:    Indirect Bilirubin 0.2 (*)    All other components within normal limits  CBC  BRAIN NATRIURETIC PEPTIDE  I-STAT TROPOININ, ED    Imaging Review Dg Chest 2 View  12/13/2014   CLINICAL DATA:  Shortness of breath, right chest pain  EXAM: CHEST  2 VIEW  COMPARISON:  12/06/2014  FINDINGS: Chronic interstitial markings/ emphysematous changes. Stable elevation of the right hemithorax. No pleural effusion or pneumothorax.  The heart is normal in size.  Degenerative changes of the thoracolumbar spine with scoliosis.  IMPRESSION: No evidence of acute cardiopulmonary disease.   Electronically Signed   By: Julian Hy M.D.   On: 12/13/2014 15:39   Ct Angio Chest Pe W/cm &/or Wo Cm  12/13/2014   CLINICAL DATA:  Shortness of breath and right-sided chest pain. History of breast cancer.  EXAM: CT ANGIOGRAPHY CHEST WITH CONTRAST  TECHNIQUE: Multidetector CT imaging of the chest was performed using the standard protocol during bolus administration of intravenous contrast. Multiplanar CT image reconstructions and MIPs were obtained to evaluate the vascular anatomy.  CONTRAST:  80 mg Omnipaque 350  COMPARISON:  02/15/2014  FINDINGS: No evidence for pulmonary embolism. There is no significant chest lymphadenopathy. No significant pericardial or pleural fluid. Surgical clips in the right  axilla. Again noted is soft tissue thickening along the anterior lower right chest  wall, best seen on sequence 4, image 60. The soft tissue thickening in this area now measures 1.3 cm and roughly measured 0.7 cm on the PET-CT from 02/15/2014. There is no gross abnormality in the adjacent ribs.  Atherosclerotic calcifications in the thoracic aorta. There is evidence for a gallstone but poorly characterized on this examination. Otherwise, images of the upper abdomen are unremarkable.  Trachea and mainstem bronchi are patent. There is chronic peripheral scarring in the right upper lobe. There is no significant airspace disease or consolidation in either lung.  No acute bone abnormality.  Review of the MIP images confirms the above findings.  IMPRESSION: Negative for pulmonary embolism.  No acute lung abnormality.  Increased soft tissue thickening along the lower anterior right chest wall. Findings could be related to post treatment changes and recommend clinical correlation in this area.   Electronically Signed   By: Markus Daft M.D.   On: 12/13/2014 17:42     EKG Interpretation   Date/Time:  Friday December 13 2014 16:12:40 EDT Ventricular Rate:  72 PR Interval:  171 QRS Duration: 70 QT Interval:  386 QTC Calculation: 422 R Axis:   41 Text Interpretation:  Sinus rhythm No significant change since last  tracing Confirmed by YAO  MD, DAVID ([id number]) on 12/13/2014 4:22:40 PM      MDM   Final diagnoses:  Shortness of breath   AIREN DALES is a 81 y.o. female here with chest pain, SOB. D-dimer neg a week ago. However, I think she is high risk for PE so will get CT angio. Will get labs and delta trop but given recent cath that is unchanged, unlikely ACS. Likely MSK vs chronic pain that needs to be better controlled.   7:02 PM Ct angio showed no PE. Still in pain despite pain meds. Will admit to tele for rule out and pain control.    Wandra Arthurs, MD 12/13/14 [id number]

## 2014-12-13 NOTE — Telephone Encounter (Signed)
Angel [name] called to says she is having uncontrolled pain in her right chest wall, and right shoulder.  She reports taking Norco q 6 hours as ordered with pain still 8/10.  She took the oxycodone 5 mg for breakthrough pain 3 times during the night with no improvement in pain, still 8/10.  She is also taking Valium as prescribed.  Please advise.

## 2014-12-13 NOTE — Telephone Encounter (Signed)
Rx faxed.    KP 

## 2014-12-13 NOTE — H&P (Signed)
[facility] History and Physical  Angel [name] IPP:[id number] DOB: [birth year] DOA: 12/13/2014  Referring physician: ED physician PCP: [name], DO  Specialists:   Chief Complaint: chest pain  HPI: Angel [name] is a 81 y.o. female with past medical history of hypertension, hyperlipidemia, GERD, melanoma, COPD, depression, anxiety, hx of PVC, hx of recurrent right breast cancer (post status of bilateral mastectomy and radiation therapy,;urrently on letrozole), who presents with chest pain.  Patient reports that she has been having chest pain for more than one week, which has been progressively getting worse. Chest pain is pleuritic. It is aggravated by deep breath. It is sharp, constant, 9 out of 10 in severity, radiating to the right shoulder, neck and arm. Patient does not have fever or chills. She has chronic mild cough with minimal amount of sputum production, which she attributes to her COPD. Patient also has mild shortness of breath. It is not sure whether it is because of COPD or new chest pain. Of note, she had bilateral mastectomy because of breast cancer. Patient denies fever, chills, headaches, cough, abdominal pain, diarrhea, constipation, dysuria, urgency, frequency, hematuria, skin rashes. No unilateral weakness, numbness or tingling sensations. No vision change or hearing loss.  In ED, patient was found to have negative chest x-ray for acute abnormalities. Initial troponin negative. BNP 61.6, no leukocytosis, temperature normal. CTA of chest is negative for pulmonary embolism, but showed Increased soft tissue thickening along the lower anterior right chest wall.   Review of Systems: As presented in the history of presenting illness, rest negative.  Where does patient live?  At home alone Can patient participate in ADLs? some  Allergy:  Allergies  Allergen Reactions  . Penicillins Rash  . Adhesive [Tape] Other (See Comments)    Pt prefers to use paper  tape    Past Medical History  Diagnosis Date  . Asthma   . Spinal stenosis   . Osteoporosis   . Chronic bronchitis   . DDD (degenerative disc disease)   . Osteoarthritis   . Degenerative joint disease   . Skin cancer of face     non melanoma  . Coronary artery disease Non-obstructive    a. 2012 Cath: LAD 40-52m->Med Rx;  b. 11/2013 Echo: EF 50-55%, nl wall motion;  c. 12/2013 Lexi MV: EF 74%, no ischemia.  . Depression   . Anxiety   . COPD (chronic obstructive pulmonary disease)   . Dyslipidemia   . Cholelithiasis   . GERD (gastroesophageal reflux disease)   . Hx of radiation therapy 07/02/1999- 08/11/1999    right supraclavucular/axillary region: 5040 cGy, 28 fractions  . Breast cancer 05/07/1999, 06/2011    a. s/p bilat mastectomies. recurrence 01/2014. Radiation, meds.  [name] Hx of radiation therapy 02/20/14- 03/27/14    right chest wall 4500 cGy 25 sessions  . Hypertension   . Kidney stones     one stones  . Cataract     eye surgery planned for 11/2014  . PVC's (premature ventricular contractions)   . Reflux esophagitis   . Pre-diabetes     Past Surgical History  Procedure Laterality Date  . Vesicovaginal fistula closure w/ tah  age 89  . Tubal ligation  1961  . Appendectomy    . Abdominal hysterectomy      in her 30's  . Small intestine surgery  1998    [facility]  . Tonsillectomy    . Breast lumpectomy  2002    right breast  . Mastectomy  07/29/11    bilateral-rt nodes-none on left  . Cardiac catheterization  03/2011, 3/15  . Breast biopsy Right 01/21/2014    Procedure: BREAST/CHEST WALL BIOPSY;  Surgeon: [name], MD;  Location: [facility];  Service: General;  Laterality: Right;  . Vein ligation and stripping Right   . Left heart catheterization with coronary angiogram N/A 12/05/2013    Procedure: LEFT HEART CATHETERIZATION WITH CORONARY ANGIOGRAM;  Surgeon: [name], MD;  Location: [facility] CATH LAB;  Service: Cardiovascular;  Laterality: N/A;  . Left heart  catheterization with coronary angiogram N/A 11/06/2014    Procedure: LEFT HEART CATHETERIZATION WITH CORONARY ANGIOGRAM;  Surgeon: [name], MD;  Location: [facility] CATH LAB;  Service: Cardiovascular;  Laterality: N/A;    Social History:  reports that she has never smoked. She has never used smokeless tobacco. She reports that she drinks alcohol. She reports that she does not use illicit drugs.  Family History:  Family History  Problem Relation Age of Onset  . Pancreatic cancer Mother   . Cancer Mother     pancreatic  . Asthma Brother   . Asthma Daughter   . Stroke Daughter   . Asthma Grandchild   . Asthma Brother     deceased  . Heart disease Father   . Emphysema Father   . Heart disease Brother   . Lymphoma Brother      Prior to Admission medications   Medication Sig Start Date End Date Taking? Authorizing Provider  albuterol (PROVENTIL HFA;VENTOLIN HFA) 108 (90 BASE) MCG/ACT inhaler Inhale into the lungs every 6 (six) hours as needed for wheezing or shortness of breath.   Yes Historical Provider, MD  aspirin EC 81 MG tablet Take 1 tablet (81 mg total) by mouth daily. 05/23/14  Yes [name], MD  B Complex-Biotin-FA (B-COMPLEX PO) Take 1 tablet by mouth daily.    Yes Historical Provider, MD  Biotin 5000 MCG CAPS Take 5,000 mcg by mouth daily.   Yes Historical Provider, MD  budesonide-formoterol (SYMBICORT) 160-4.5 MCG/ACT inhaler Inhale 2 puffs into the lungs 2 (two) times daily as needed. Patient taking differently: Inhale 2 puffs into the lungs 2 (two) times daily as needed (shortness of breath.).  08/27/14  Yes Unk Pinto, MD  Calcium-Magnesium-Zinc 1000-400-15 MG TABS Take 1 tablet by mouth daily.     Yes Historical Provider, MD  celecoxib (CELEBREX) 200 MG capsule Take 1 capsule daily with food for pain & inflammation 12/13/14  Yes [name], DO  Cholecalciferol (VITAMIN D-3) 5000 UNITS TABS Take 1 tablet by mouth daily.    Yes Historical Provider, MD  diazepam  (VALIUM) 5 MG tablet Take 0.5-1 tablets (2.5-5 mg total) by mouth every 8 (eight) hours as needed for anxiety. 12/10/14  Yes Susanne Borders, NP  DULoxetine (CYMBALTA) 60 MG capsule Take 1 capsule by mouth  every day for mood 04/02/14  Yes Unk Pinto, MD  ezetimibe (ZETIA) 10 MG tablet Take 10 mg by mouth daily.   Yes Historical Provider, MD  Fish Oil OIL Take 1 capsule by mouth daily.    Yes Historical Provider, MD  Flaxseed, Linseed, (FLAX SEED OIL PO) Take 5 mLs by mouth daily. 1 tsp daily   Yes Historical Provider, MD  HYDROcodone-acetaminophen (NORCO) 10-325 MG per tablet Take 1 tablet by mouth every 6 (six) hours as needed for moderate pain.    Yes Historical Provider, MD  isosorbide mononitrate (IMDUR) 30 MG 24 hr tablet Take 1 tablet (  30 mg total) by mouth daily. 10/25/14  Yes [name], MD  letrozole [facility]) 2.5 MG tablet Take 1 tablet by mouth  daily 10/23/14  Yes [name], MD  Misc Natural Products (GLUCOSAMINE CHOND COMPLEX/MSM PO) Take 1 tablet by mouth daily.    Yes Historical Provider, MD  montelukast (SINGULAIR) 10 MG tablet Take 1 tablet (10 mg total) by mouth at bedtime. 04/15/14  Yes Unk Pinto, MD  Multiple Vitamin (MULTIVITAMIN) tablet Take 1 tablet by mouth daily.   Yes Historical Provider, MD  nitroGLYCERIN (NITROSTAT) 0.4 MG SL tablet Place 1 tablet (0.4 mg total) under the tongue every 5 (five) minutes x 3 doses as needed for chest pain. 01/19/14  Yes Rogelia Mire, NP  ondansetron (ZOFRAN ODT) 4 MG disintegrating tablet Take 1 tablet (4 mg total) by mouth every 8 (eight) hours as needed for nausea or vomiting. 12/06/14  Yes Pamella Pert, MD  oxyCODONE (ROXICODONE) 5 MG immediate release tablet Take 1 tablet (5 mg total) by mouth every 4 (four) hours as needed for breakthrough pain. 12/06/14  Yes Pamella Pert, MD    Physical Exam: Filed Vitals:   12/13/14 1815 12/13/14 1830 12/13/14 1845 12/13/14 1900  BP: 139/67 156/69 135/66 140/69  Pulse:  74 72 73 77  Temp:      TempSrc:      Resp: 20 21 18 20   SpO2: 96% 95% 94% 96%   General: Not in acute distress HEENT:       Eyes: PERRL, EOMI, no scleral icterus       ENT: No discharge from the ears and nose, no pharynx injection, no tonsillar enlargement.        Neck: No JVD, no bruit, no mass felt. Cardiac: S1/S2, RRR, No murmurs, No gallops or rubs Pulm: Good air movement bilaterally. Clear to auscultation bilaterally. No rales, wheezing, rhonchi or rubs. There is diffused tenderness in her chest wall, right is worse than the left. Abd: Soft, nondistended, nontender, no rebound pain, no organomegaly, BS present Ext: 1+ leg pitting edema bilaterally, R is slightly worse than the left. 2+DP/PT pulse bilaterally Musculoskeletal: No joint deformities, erythema, or stiffness, ROM full Skin: No rashes.  Neuro: Alert and oriented X3, cranial nerves II-XII grossly intact, muscle strength 5/5 in all extremeties, sensation to light touch intact. Psych: Patient is not psychotic, no suicidal or hemocidal ideation.  Labs on Admission:  Basic Metabolic Panel:  Recent Labs Lab 12/13/14 1525  NA 137  K 3.8  CL 104  CO2 27  GLUCOSE 138*  BUN 15  CREATININE 0.76  CALCIUM 9.7   Liver Function Tests:  Recent Labs Lab 12/13/14 1525  AST 23  ALT 14  ALKPHOS 48  BILITOT 0.4  PROT 6.8  ALBUMIN 3.9   No results for input(s): LIPASE, AMYLASE in the last 168 hours. No results for input(s): AMMONIA in the last 168 hours. CBC:  Recent Labs Lab 12/13/14 1525  WBC 7.1  HGB 13.5  HCT 40.5  MCV 94.6  PLT 218   Cardiac Enzymes: No results for input(s): CKTOTAL, CKMB, CKMBINDEX, TROPONINI in the last 168 hours.  BNP (last 3 results)  Recent Labs  12/06/14 1244 12/13/14 1525  BNP 95.4 61.6    ProBNP (last 3 results)  Recent Labs  01/17/14 1531  PROBNP 163.6    CBG: No results for input(s): GLUCAP in the last 168 hours.  Radiological Exams on Admission: Dg Chest 2  View  12/13/2014   CLINICAL  DATA:  Shortness of breath, right chest pain  EXAM: CHEST  2 VIEW  COMPARISON:  12/06/2014  FINDINGS: Chronic interstitial markings/ emphysematous changes. Stable elevation of the right hemithorax. No pleural effusion or pneumothorax.  The heart is normal in size.  Degenerative changes of the thoracolumbar spine with scoliosis.  IMPRESSION: No evidence of acute cardiopulmonary disease.   Electronically Signed   By: Julian Hy M.D.   On: 12/13/2014 15:39   Ct Angio Chest Pe W/cm &/or Wo Cm  12/13/2014   CLINICAL DATA:  Shortness of breath and right-sided chest pain. History of breast cancer.  EXAM: CT ANGIOGRAPHY CHEST WITH CONTRAST  TECHNIQUE: Multidetector CT imaging of the chest was performed using the standard protocol during bolus administration of intravenous contrast. Multiplanar CT image reconstructions and MIPs were obtained to evaluate the vascular anatomy.  CONTRAST:  80 mg Omnipaque 350  COMPARISON:  02/15/2014  FINDINGS: No evidence for pulmonary embolism. There is no significant chest lymphadenopathy. No significant pericardial or pleural fluid. Surgical clips in the right axilla. Again noted is soft tissue thickening along the anterior lower right chest wall, best seen on sequence 4, image 60. The soft tissue thickening in this area now measures 1.3 cm and roughly measured 0.7 cm on the PET-CT from 02/15/2014. There is no gross abnormality in the adjacent ribs.  Atherosclerotic calcifications in the thoracic aorta. There is evidence for a gallstone but poorly characterized on this examination. Otherwise, images of the upper abdomen are unremarkable.  Trachea and mainstem bronchi are patent. There is chronic peripheral scarring in the right upper lobe. There is no significant airspace disease or consolidation in either lung.  No acute bone abnormality.  Review of the MIP images confirms the above findings.  IMPRESSION: Negative for pulmonary embolism.  No acute  lung abnormality.  Increased soft tissue thickening along the lower anterior right chest wall. Findings could be related to post treatment changes and recommend clinical correlation in this area.   Electronically Signed   By: Markus Daft M.D.   On: 12/13/2014 17:42    EKG: Independently reviewed. No ischemic change.   Assessment/Plan Principal Problem:   Chest pain Active Problems:   Asthma   DDD (degenerative disc disease), lumbar   Mixed hyperlipidemia   COPD   Essential hypertension   Prediabetes   CAD (coronary artery disease)   Breast cancer, right breast   GERD (gastroesophageal reflux disease)  Chest pain: Patient has atypical chest pain. It is pleuritic in nature. Pulmonary embolism was ruled out.  Her chest pain seems to be from her chest wall. Given her history of mastectomy and radiation therapy, it is likely due to scar tissue or neuropathy. In consistent with this possibility, the CTA showed an increased soft tissue thickening along the lower anterior right chest wall.  patient does not have signs of pneumonia or COPD excess sedation. Patient had a cath in February that shows 50-70% LAD lesion that is unchanged and patient was thought not to have cardiac chest pain.   -will admit to tele bed -control pain: When necessary morphine for severe pain or Percocet for moderate pain -Continue home Imdur and when necessary nitroglycerin -Start Neurontin 300 mg 3 times a day -continue ASA -Mylanta and Protonix empirically for possible GERD -trop x 3 -repeat ekg in am -check UDS and lipase  COPD and asthma: Stable. No signs of acute exacerbation. Lung auscultation clear. -Continue home medications: Albuterol inhaler, Symbicort inhaler, Singulair   HTN: not on  medications at home. Blood pressure is slightly elevated. Likely due to pain - observe closely -Pain control as above   Recurrent Breast cancer: post status of bilateral mastectomy and radiation therapy. She is followed up  by doctor Cecelia Byars. currently on letrozole. -follow up with her oncologist  Depression and anxiety: Stable: No suicidal or homicidal ideations. -Continue home medications: Cymbalta  Mixed dyslipidemia: -continue Fish oil, Flaxseed, and Zetia   DVT ppx: SQ Heparin        Code Status: Full code Family Communication: None at bed side.  Disposition Plan: Admit to inpatient   Date of Service 12/13/2014    Ivor Costa [facility] Pager [phone]  If 7PM-7AM, please contact night-coverage [email or web address] Password [facility] 12/13/2014, 7:06 PM

## 2014-12-13 NOTE — Telephone Encounter (Signed)
Refill x1  3 refills 

## 2014-12-13 NOTE — ED Notes (Signed)
Pt reports having chest pains, shoulder pain, right arm pain, neck pain, nausea for several days. Unable to sleep at night due to pain. ekg done at triage. Airway intact.

## 2014-12-13 NOTE — Telephone Encounter (Signed)
This RN returned call to pt to inform her of MD recommendation for CT scan for evaluation.  Per this call at approximately 2pm pt states she has not been able to get better control of her pain- as well as  presently pain is more prominent across her shoulders and mid chest. She is nauseated and has developed a headache.  Pt states " I just do not feel good "  Per discussion this RN informed pt of best plan to proceed to the ER due to unresolved issues - time of day on a Friday.  Pt verbalized agreement to above and will arrange transportation.  This RN called and spoke with Merrily Brittle charge nurse at [facility] ER of pt's pending arrival and symptoms.

## 2014-12-13 NOTE — ED Notes (Signed)
Pt to Ct

## 2014-12-13 NOTE — ED Notes (Signed)
Admitting physician in room at this time. 

## 2014-12-13 NOTE — Telephone Encounter (Signed)
Returning pt call from 136. Had to lvm returning call about her question. She was asking if a medication had been refilled, she did not specify which med she was calling about. She also was saying nothing is helping her pain, she is taking vicodin, oxycodone, tylenol, valium. She did not describe where or what pain. Noted at time of this call that pt had been admitted to ED at 250 pm.

## 2014-12-13 NOTE — ED Notes (Addendum)
Pt reports some chest, arm, neck and back pain. Pt reports that she was seen at [facility] long 1 week for same symptoms, blood clot was ruled out. Report that she was given oxygen and morphine "which really helped the pain." pt states she has taken 4 vicodan, 2-3 hydrocodone, and 2 valium today "that she recalls." pt reports she has chest wall cancer. has appt with cardiologist, was told to follow up with them from [place] long. Nad, pt talking with no obvious sob, alert, oriented.

## 2014-12-13 NOTE — Telephone Encounter (Signed)
This RN spoke with pt per note from [place].  Jannelly states pt is ongoing x 1 week with interference with sleep the past 2 nights " and I usually sleep like a baby "  Pt has taken 2 vicoden this am ( her usual medication for morning pain prescribed by her orthopedist ) but pain is not controlled.  Of note pt is able to speak clearly and in normal rhythm without interruption per pain concerns.  Aldine is concerned with pain and per discussion she would like to proceed to have a scan.  She feels with use of current meds including adding valium ( being prescribed by her orthopedist) she can control the discomfort for evaluation.  Pt may need a refill on the hydrocodone.  Per call pt is aware her concerns will be given to MD upon his coming in to the office later this day and call returned to her.

## 2014-12-14 DIAGNOSIS — R079 Chest pain, unspecified: Secondary | ICD-10-CM | POA: Diagnosis not present

## 2014-12-14 DIAGNOSIS — F419 Anxiety disorder, unspecified: Secondary | ICD-10-CM

## 2014-12-14 DIAGNOSIS — J441 Chronic obstructive pulmonary disease with (acute) exacerbation: Secondary | ICD-10-CM | POA: Diagnosis not present

## 2014-12-14 DIAGNOSIS — F329 Major depressive disorder, single episode, unspecified: Secondary | ICD-10-CM

## 2014-12-14 DIAGNOSIS — R0602 Shortness of breath: Secondary | ICD-10-CM | POA: Diagnosis not present

## 2014-12-14 DIAGNOSIS — C50911 Malignant neoplasm of unspecified site of right female breast: Secondary | ICD-10-CM | POA: Diagnosis not present

## 2014-12-14 DIAGNOSIS — K210 Gastro-esophageal reflux disease with esophagitis: Secondary | ICD-10-CM | POA: Diagnosis not present

## 2014-12-14 DIAGNOSIS — I25119 Atherosclerotic heart disease of native coronary artery with unspecified angina pectoris: Secondary | ICD-10-CM

## 2014-12-14 DIAGNOSIS — R0789 Other chest pain: Secondary | ICD-10-CM | POA: Diagnosis not present

## 2014-12-14 DIAGNOSIS — R7309 Other abnormal glucose: Secondary | ICD-10-CM

## 2014-12-14 LAB — TROPONIN I: Troponin I: <0.03 ng/mL (ref <0.031)

## 2014-12-14 LAB — BASIC METABOLIC PANEL
Anion gap: 7 (ref 5–15)
BUN: 10 mg/dL (ref 6–23)
CALCIUM: 8.7 mg/dL (ref 8.4–10.5)
CO2: 25 mmol/L (ref 19–32)
Chloride: 107 mmol/L (ref 96–112)
Creatinine, Ser: 0.63 mg/dL (ref 0.50–1.10)
GFR calc Af Amer: >90 mL/min (ref >90)
GFR calc non Af Amer: 82 mL/min — ABNORMAL LOW (ref >90)
Glucose, Bld: 80 mg/dL (ref 70–99)
POTASSIUM: 3.8 mmol/L (ref 3.5–5.1)
Sodium: 139 mmol/L (ref 135–145)

## 2014-12-14 LAB — CBC
HEMATOCRIT: 36.6 % (ref 36.0–46.0)
Hemoglobin: 12.1 g/dL (ref 12.0–15.0)
MCH: 31.3 pg (ref 26.0–34.0)
MCHC: 33.1 g/dL (ref 30.0–36.0)
MCV: 94.6 fL (ref 78.0–100.0)
Platelets: 174 10*3/uL (ref 150–400)
RBC: 3.87 MIL/uL (ref 3.87–5.11)
RDW: 12.6 % (ref 11.5–15.5)
WBC: 3.8 10*3/uL — ABNORMAL LOW (ref 4.0–10.5)

## 2014-12-14 LAB — GLUCOSE, CAPILLARY: Glucose-Capillary: 75 mg/dL (ref 70–99)

## 2014-12-14 MED ORDER — METHOCARBAMOL 500 MG PO TABS
500.0000 mg | ORAL_TABLET | Freq: Three times a day (TID) | ORAL | Status: DC
  Administered 2014-12-14 – 2014-12-16 (×7): 500 mg via ORAL
  Filled 2014-12-14 (×7): qty 1

## 2014-12-14 NOTE — Progress Notes (Signed)
[facility] PROGRESS NOTE   Angel [name] BMW:[id number] DOB: [birth year] DOA: 12/13/2014 PCP: [name], DO  HPI/Subjective: Patient complaining about pain when she moves around, very tender in the right side of her chest.  Assessment/Plan: Principal Problem:   Chest pain Active Problems:   Asthma   DDD (degenerative disc disease), lumbar   Mixed hyperlipidemia   COPD   Essential hypertension   Prediabetes   CAD (coronary artery disease)   Breast cancer, right breast   GERD (gastroesophageal reflux disease)   Anxiety   Depression    Chest pain Chest pain is likely musculoskeletal in nature, reproducible with very mild palpation. Patient has history of bilateral mastectomy with possible scar formation causing some pain. Pain along the right pectoralis major muscle, tenderness also at the both trapezius muscles. We'll treat with Neurontin, and muscle relaxant and Percocet for the pain. Very low suspicion for cardiac pain, anyway ACS ruled out by 3 sets of negative cardiac enzymes and negative EKG.  COPD and asthma Stable, no signs of acute exacerbation, no shortness of breath. Continue home bronchodilators.  Hypertension Non-medications at home, blood pressure was slightly elevated secondary to pain. Control pain and monitor.  Recurrent rest cancer Status post bilateral mastectomy with radiation and chemotherapy. Follow-up with oncology as outpatient.  Depression and anxiety No suicidal or homicidal ideation, continue Cymbalta.  Code Status: Full Code Family Communication: Plan discussed with the patient. Disposition Plan: Remains inpatient Diet: Diet Heart  Consultants:  None  Procedures:  None  Antibiotics:  None   Objective: Filed Vitals:   12/14/14 1039  BP: 98/55  Pulse: 80  Temp:   Resp:     Intake/Output Summary (Last 24 hours) at 12/14/14 1118 Last data filed at 12/14/14 1025  Gross per 24 hour  Intake   1020 ml    Output    800 ml  Net    220 ml   Filed Weights   12/13/14 2055  Weight: 60.238 kg (132 lb 12.8 oz)    Exam: General: Alert and awake, oriented x3, not in any acute distress. HEENT: anicteric sclera, pupils reactive to light and accommodation, EOMI CVS: S1-S2 clear, no murmur rubs or gallops Chest: clear to auscultation bilaterally, no wheezing, rales or rhonchi Abdomen: soft nontender, nondistended, normal bowel sounds, no organomegaly Extremities: no cyanosis, clubbing or edema noted bilaterally Neuro: Cranial nerves II-XII intact, no focal neurological deficits  Data Reviewed: Basic Metabolic Panel:  Recent Labs Lab 12/13/14 1525 12/14/14 0902  NA 137 139  K 3.8 3.8  CL 104 107  CO2 27 25  GLUCOSE 138* 80  BUN 15 10  CREATININE 0.76 0.63  CALCIUM 9.7 8.7   Liver Function Tests:  Recent Labs Lab 12/13/14 1525  AST 23  ALT 14  ALKPHOS 48  BILITOT 0.4  PROT 6.8  ALBUMIN 3.9    Recent Labs Lab 12/13/14 2154  LIPASE 23   No results for input(s): AMMONIA in the last 168 hours. CBC:  Recent Labs Lab 12/13/14 1525 12/14/14 0902  WBC 7.1 3.8*  HGB 13.5 12.1  HCT 40.5 36.6  MCV 94.6 94.6  PLT 218 174   Cardiac Enzymes:  Recent Labs Lab 12/13/14 2154 12/14/14 0250 12/14/14 0902  TROPONINI <0.03 <0.03 <0.03   BNP (last 3 results)  Recent Labs  12/06/14 1244 12/13/14 1525  BNP 95.4 61.6    ProBNP (last 3 results)  Recent Labs  01/17/14 1531  PROBNP 163.6    CBG:  Recent  Labs Lab 12/14/14 0844  GLUCAP 75    Micro No results found for this or any previous visit (from the past 240 hour(s)).   Studies: Dg Chest 2 View  12/13/2014   CLINICAL DATA:  Shortness of breath, right chest pain  EXAM: CHEST  2 VIEW  COMPARISON:  12/06/2014  FINDINGS: Chronic interstitial markings/ emphysematous changes. Stable elevation of the right hemithorax. No pleural effusion or pneumothorax.  The heart is normal in size.  Degenerative changes of  the thoracolumbar spine with scoliosis.  IMPRESSION: No evidence of acute cardiopulmonary disease.   Electronically Signed   By: Julian Hy M.D.   On: 12/13/2014 15:39   Ct Angio Chest Pe W/cm &/or Wo Cm  12/13/2014   CLINICAL DATA:  Shortness of breath and right-sided chest pain. History of breast cancer.  EXAM: CT ANGIOGRAPHY CHEST WITH CONTRAST  TECHNIQUE: Multidetector CT imaging of the chest was performed using the standard protocol during bolus administration of intravenous contrast. Multiplanar CT image reconstructions and MIPs were obtained to evaluate the vascular anatomy.  CONTRAST:  80 mg Omnipaque 350  COMPARISON:  02/15/2014  FINDINGS: No evidence for pulmonary embolism. There is no significant chest lymphadenopathy. No significant pericardial or pleural fluid. Surgical clips in the right axilla. Again noted is soft tissue thickening along the anterior lower right chest wall, best seen on sequence 4, image 60. The soft tissue thickening in this area now measures 1.3 cm and roughly measured 0.7 cm on the PET-CT from 02/15/2014. There is no gross abnormality in the adjacent ribs.  Atherosclerotic calcifications in the thoracic aorta. There is evidence for a gallstone but poorly characterized on this examination. Otherwise, images of the upper abdomen are unremarkable.  Trachea and mainstem bronchi are patent. There is chronic peripheral scarring in the right upper lobe. There is no significant airspace disease or consolidation in either lung.  No acute bone abnormality.  Review of the MIP images confirms the above findings.  IMPRESSION: Negative for pulmonary embolism.  No acute lung abnormality.  Increased soft tissue thickening along the lower anterior right chest wall. Findings could be related to post treatment changes and recommend clinical correlation in this area.   Electronically Signed   By: Markus Daft M.D.   On: 12/13/2014 17:42    Scheduled Meds: . aspirin EC  81 mg Oral Daily  .  B-complex with vitamin C  1 tablet Oral Daily  . celecoxib  100 mg Oral Daily  . cholecalciferol  5,000 Units Oral Daily  . DULoxetine  60 mg Oral Daily  . ezetimibe  10 mg Oral QHS  . gabapentin  300 mg Oral TID  . heparin  5,000 Units Subcutaneous 3 times per day  . isosorbide mononitrate  30 mg Oral Daily  . letrozole  2.5 mg Oral Daily  . montelukast  10 mg Oral QHS  . multivitamin with minerals  1 tablet Oral Daily  . omega-3 acid ethyl esters  1 g Oral Daily  . pantoprazole  40 mg Oral Q1200  . sodium chloride  3 mL Intravenous Q12H   Continuous Infusions: . sodium chloride 75 mL/hr at 12/14/14 1033       Time spent: 35 minutes    [facility]  [facility] Pager [phone] If 7PM-7AM, please contact night-coverage at [email or web address], password [facility] 12/14/2014, 11:18 AM  LOS: 1 day

## 2014-12-14 NOTE — Progress Notes (Signed)
PT Cancellation Note  Patient Details Name: Angel [name] MRN: [id number] DOB: 12/31/1931   Cancelled Treatment:    Reason Eval/Treat Not Completed: Pain limiting ability to participate.  Will f/u another time.     Anabela Crayton, Thornton Papas 12/14/2014, 11:30 AM

## 2014-12-15 ENCOUNTER — Other Ambulatory Visit: Payer: Self-pay | Admitting: Oncology

## 2014-12-15 DIAGNOSIS — R0789 Other chest pain: Secondary | ICD-10-CM | POA: Diagnosis not present

## 2014-12-15 DIAGNOSIS — R079 Chest pain, unspecified: Secondary | ICD-10-CM | POA: Diagnosis not present

## 2014-12-15 DIAGNOSIS — F419 Anxiety disorder, unspecified: Secondary | ICD-10-CM | POA: Diagnosis not present

## 2014-12-15 DIAGNOSIS — I25119 Atherosclerotic heart disease of native coronary artery with unspecified angina pectoris: Secondary | ICD-10-CM | POA: Diagnosis not present

## 2014-12-15 DIAGNOSIS — C50911 Malignant neoplasm of unspecified site of right female breast: Secondary | ICD-10-CM | POA: Diagnosis not present

## 2014-12-15 LAB — CBC
HCT: 37.9 % (ref 36.0–46.0)
Hemoglobin: 12.3 g/dL (ref 12.0–15.0)
MCH: 31.3 pg (ref 26.0–34.0)
MCHC: 32.5 g/dL (ref 30.0–36.0)
MCV: 96.4 fL (ref 78.0–100.0)
Platelets: 187 10*3/uL (ref 150–400)
RBC: 3.93 MIL/uL (ref 3.87–5.11)
RDW: 12.4 % (ref 11.5–15.5)
WBC: 4.3 10*3/uL (ref 4.0–10.5)

## 2014-12-15 LAB — BASIC METABOLIC PANEL
ANION GAP: 4 — AB (ref 5–15)
BUN: 12 mg/dL (ref 6–23)
CALCIUM: 8.9 mg/dL (ref 8.4–10.5)
CHLORIDE: 104 mmol/L (ref 96–112)
CO2: 30 mmol/L (ref 19–32)
Creatinine, Ser: 0.67 mg/dL (ref 0.50–1.10)
GFR calc Af Amer: >90 mL/min (ref >90)
GFR calc non Af Amer: 81 mL/min — ABNORMAL LOW (ref >90)
Glucose, Bld: 93 mg/dL (ref 70–99)
Potassium: 4 mmol/L (ref 3.5–5.1)
SODIUM: 138 mmol/L (ref 135–145)

## 2014-12-15 LAB — GLUCOSE, CAPILLARY: GLUCOSE-CAPILLARY: 70 mg/dL (ref 70–99)

## 2014-12-15 MED ORDER — OXYCODONE-ACETAMINOPHEN 5-325 MG PO TABS
1.0000 | ORAL_TABLET | ORAL | Status: DC | PRN
  Administered 2014-12-15: 2 via ORAL
  Administered 2014-12-15 – 2014-12-16 (×3): 1 via ORAL
  Filled 2014-12-15: qty 2
  Filled 2014-12-15: qty 1
  Filled 2014-12-15: qty 2
  Filled 2014-12-15: qty 1

## 2014-12-15 MED ORDER — POLYETHYLENE GLYCOL 3350 17 G PO PACK: 17.0000 g | PACK | Freq: Every day | ORAL | Status: DC | PRN

## 2014-12-15 MED ORDER — SENNA 8.6 MG PO TABS: 2.0000 | ORAL_TABLET | Freq: Every evening | ORAL | Status: DC | PRN

## 2014-12-15 NOTE — Evaluation (Addendum)
Occupational Therapy Evaluation Patient Details Name: Angel [name] MRN: [id number] DOB: 1931/12/04 Today's Date: 12/15/2014    History of Present Illness 81 y.o. female admitted with chest pain. Hx of CAD, COPD, and bilateral mastectomy.   Clinical Impression   PTA pt lived at home and was independent with ADLS. During OT session VSS and pt required supervision for functional mobility and ADLs with use of RW. Pt is somewhat anxious, but doing well. All education and training completed. No further acute OT needs.     Follow Up Recommendations  No OT follow up;Supervision - Intermittent    Equipment Recommendations  None recommended by OT    Recommendations for Other Services       Precautions / Restrictions Restrictions Weight Bearing Restrictions: No      Mobility Bed Mobility Overal bed mobility: Modified Independent             General bed mobility comments: Requires extra time  Transfers Overall transfer level: Needs assistance Equipment used: Rolling walker (2 wheeled) Transfers: Sit to/from Stand Sit to Stand: Supervision         General transfer comment: Supervision for safety. VC for hand placement. Good stability once holding RW for supprt. Did not require physical assist.    Balance Overall balance assessment: Needs assistance Sitting-balance support: No upper extremity supported;Feet supported Sitting balance-Leahy Scale: Good     Standing balance support: No upper extremity supported;During functional activity Standing balance-Leahy Scale: Fair                              ADL Overall ADL's : Needs assistance/impaired                                       General ADL Comments: Pt overall at Supervision level with functional mobility and ADLs. Pt was able to don socks sitting EOB and ambulated in hallway without rest breaks for 121ft. BP stable upon return to room at 121/59. Daughter arrived at end of  session.      Vision  Pt reports hx of legally blind in L eye and cataracts in R eye. Pt reports no change from baseline.           Pertinent Vitals/Pain Pain Assessment: 0-10 Pain Score: 5  Pain Location: Rt pectoralis major, Rt upper trap Pain Descriptors / Indicators: Aching;Sharp;Constant Pain Intervention(s): Limited activity within patient's tolerance;Monitored during session;Repositioned;Heat applied     Hand Dominance Right   Extremity/Trunk Assessment Upper Extremity Assessment Upper Extremity Assessment: Generalized weakness   Lower Extremity Assessment Lower Extremity Assessment: Generalized weakness   Cervical / Trunk Assessment Cervical / Trunk Assessment: Normal   Communication Communication Communication: No difficulties   Cognition Arousal/Alertness: Awake/alert Behavior During Therapy: WFL for tasks assessed/performed Overall Cognitive Status: Within Functional Limits for tasks assessed                                Home Living Family/patient expects to be discharged to:: Private residence Living Arrangements: Alone Available Help at Discharge: Family;Available PRN/intermittently Type of Home: Apartment Home Access: Stairs to enter Entrance Stairs-Number of Steps: 5 Entrance Stairs-Rails: Left Home Layout: One level     Bathroom Shower/Tub: Teacher, early years/pre: Standard     Home Equipment: Cane - single  point;Tub bench          Prior Functioning/Environment Level of Independence: Independent             OT Diagnosis: Generalized weakness;Acute pain    End of Session Equipment Utilized During Treatment: Gait belt;Rolling walker Nurse Communication: Mobility status  Activity Tolerance: Patient tolerated treatment well Patient left: in bed;with call bell/phone within reach;with family/visitor present   Time: 1031-5945 OT Time Calculation (min): 24 min Charges:  OT General Charges $OT Visit: 1  Procedure OT Evaluation $Initial OT Evaluation Tier I: 1 Procedure OT Treatments $Therapeutic Activity: 8-22 mins G-Codes:     Jan 13, 2015 1600  OT G-codes **NOT FOR INPATIENT CLASS**  Functional Assessment Tool Used clinical judgement  Functional Limitation Self care  Self Care Current Status (O5929) [facility]  Self Care Goal Status (W4462) [facility]  Self Care Discharge Status (M6381) [place] [place] 01-13-15, 5:11 PM  Cyndie Chime, OTR/L Occupational Therapist [phone] (pager)

## 2014-12-15 NOTE — Progress Notes (Signed)
Imdur 30mg  tablet held due to low BP of 91/34.  Pt states she has felt light headed over the past 3-4 weeks at home and did feel like this this am when she got up and walked to bathroom.  Dr. Hartford Poli notified.  Will continue to monitor.

## 2014-12-15 NOTE — Progress Notes (Signed)
[facility] PROGRESS NOTE   Angel [name]:[id number] DOB: [birth year] DOA: 12/13/2014 PCP: [name], DO  HPI/Subjective: Musculoskeletal pain, like a switch to Percocet. Better, continue narcotic pain control.  Assessment/Plan: Principal Problem:   Chest pain Active Problems:   Asthma   DDD (degenerative disc disease), lumbar   Mixed hyperlipidemia   COPD   Essential hypertension   Prediabetes   CAD (coronary artery disease)   Breast cancer, right breast   GERD (gastroesophageal reflux disease)   Anxiety   Depression    Chest pain Chest pain is likely musculoskeletal in nature, reproducible with very mild palpation. Patient has history of bilateral mastectomy with possible scar formation causing some pain. Pain along the right pectoralis major muscle, tenderness also at the both trapezius muscles. Very low suspicion for cardiac pain, anyway ACS ruled out by 3 sets of negative cardiac enzymes and negative EKG. Chest pain likely is musculoskeletal, muscle relaxant and Percocet added. Increase Neurontin dose.  COPD and asthma Stable, no signs of acute exacerbation, no shortness of breath. Continue home bronchodilators.  Hypertension Non-medications at home, blood pressure was slightly elevated secondary to pain. Control pain and monitor.  Recurrent rest cancer Status post bilateral mastectomy with radiation and chemotherapy. Follow-up with oncology as outpatient.  Depression and anxiety No suicidal or homicidal ideation, continue Cymbalta.  Code Status: Full Code Family Communication: Plan discussed with the patient. Disposition Plan: Remains inpatient Diet: Diet Heart  Consultants:  None  Procedures:  None  Antibiotics:  None   Objective: Filed Vitals:   12/15/14 1115  BP: 113/37  Pulse: 70  Temp:   Resp:     Intake/Output Summary (Last 24 hours) at 12/15/14 1351 Last data filed at 12/15/14 0954  Gross per 24 hour  Intake     480 ml  Output   1250 ml  Net   -770 ml   Filed Weights   12/13/14 2055 12/15/14 0500  Weight: 60.238 kg (132 lb 12.8 oz) 62.143 kg (137 lb)    Exam: General: Alert and awake, oriented x3, not in any acute distress. HEENT: anicteric sclera, pupils reactive to light and accommodation, EOMI CVS: S1-S2 clear, no murmur rubs or gallops Chest: clear to auscultation bilaterally, no wheezing, rales or rhonchi Abdomen: soft nontender, nondistended, normal bowel sounds, no organomegaly Extremities: no cyanosis, clubbing or edema noted bilaterally Neuro: Cranial nerves II-XII intact, no focal neurological deficits  Data Reviewed: Basic Metabolic Panel:  Recent Labs Lab 12/13/14 1525 12/14/14 0902 12/15/14 0430  NA 137 139 138  K 3.8 3.8 4.0  CL 104 107 104  CO2 27 25 30   GLUCOSE 138* 80 93  BUN 15 10 12   CREATININE 0.76 0.63 0.67  CALCIUM 9.7 8.7 8.9   Liver Function Tests:  Recent Labs Lab 12/13/14 1525  AST 23  ALT 14  ALKPHOS 48  BILITOT 0.4  PROT 6.8  ALBUMIN 3.9    Recent Labs Lab 12/13/14 2154  LIPASE 23   No results for input(s): AMMONIA in the last 168 hours. CBC:  Recent Labs Lab 12/13/14 1525 12/14/14 0902 12/15/14 0430  WBC 7.1 3.8* 4.3  HGB 13.5 12.1 12.3  HCT 40.5 36.6 37.9  MCV 94.6 94.6 96.4  PLT 218 174 187   Cardiac Enzymes:  Recent Labs Lab 12/13/14 2154 12/14/14 0250 12/14/14 0902  TROPONINI <0.03 <0.03 <0.03   BNP (last 3 results)  Recent Labs  12/06/14 1244 12/13/14 1525  BNP 95.4 61.6    ProBNP (last  3 results)  Recent Labs  01/17/14 1531  PROBNP 163.6    CBG:  Recent Labs Lab 12/14/14 0844 12/15/14 0809  GLUCAP 75 70    Micro No results found for this or any previous visit (from the past 240 hour(s)).   Studies: Dg Chest 2 View  12/13/2014   CLINICAL DATA:  Shortness of breath, right chest pain  EXAM: CHEST  2 VIEW  COMPARISON:  12/06/2014  FINDINGS: Chronic interstitial markings/ emphysematous  changes. Stable elevation of the right hemithorax. No pleural effusion or pneumothorax.  The heart is normal in size.  Degenerative changes of the thoracolumbar spine with scoliosis.  IMPRESSION: No evidence of acute cardiopulmonary disease.   Electronically Signed   By: Julian Hy M.D.   On: 12/13/2014 15:39   Ct Angio Chest Pe W/cm &/or Wo Cm  12/13/2014   CLINICAL DATA:  Shortness of breath and right-sided chest pain. History of breast cancer.  EXAM: CT ANGIOGRAPHY CHEST WITH CONTRAST  TECHNIQUE: Multidetector CT imaging of the chest was performed using the standard protocol during bolus administration of intravenous contrast. Multiplanar CT image reconstructions and MIPs were obtained to evaluate the vascular anatomy.  CONTRAST:  80 mg Omnipaque 350  COMPARISON:  02/15/2014  FINDINGS: No evidence for pulmonary embolism. There is no significant chest lymphadenopathy. No significant pericardial or pleural fluid. Surgical clips in the right axilla. Again noted is soft tissue thickening along the anterior lower right chest wall, best seen on sequence 4, image 60. The soft tissue thickening in this area now measures 1.3 cm and roughly measured 0.7 cm on the PET-CT from 02/15/2014. There is no gross abnormality in the adjacent ribs.  Atherosclerotic calcifications in the thoracic aorta. There is evidence for a gallstone but poorly characterized on this examination. Otherwise, images of the upper abdomen are unremarkable.  Trachea and mainstem bronchi are patent. There is chronic peripheral scarring in the right upper lobe. There is no significant airspace disease or consolidation in either lung.  No acute bone abnormality.  Review of the MIP images confirms the above findings.  IMPRESSION: Negative for pulmonary embolism.  No acute lung abnormality.  Increased soft tissue thickening along the lower anterior right chest wall. Findings could be related to post treatment changes and recommend clinical  correlation in this area.   Electronically Signed   By: Markus Daft M.D.   On: 12/13/2014 17:42    Scheduled Meds: . aspirin EC  81 mg Oral Daily  . B-complex with vitamin C  1 tablet Oral Daily  . celecoxib  100 mg Oral Daily  . cholecalciferol  5,000 Units Oral Daily  . DULoxetine  60 mg Oral Daily  . ezetimibe  10 mg Oral QHS  . gabapentin  300 mg Oral TID  . heparin  5,000 Units Subcutaneous 3 times per day  . isosorbide mononitrate  30 mg Oral Daily  . letrozole  2.5 mg Oral Daily  . methocarbamol  500 mg Oral TID  . montelukast  10 mg Oral QHS  . multivitamin with minerals  1 tablet Oral Daily  . omega-3 acid ethyl esters  1 g Oral Daily  . pantoprazole  40 mg Oral Q1200  . sodium chloride  3 mL Intravenous Q12H   Continuous Infusions:       Time spent: 35 minutes    [facility]  [facility] Pager [phone] If 7PM-7AM, please contact night-coverage at [email or web address], password [facility] 12/15/2014, 1:51 PM  LOS: 2 days

## 2014-12-15 NOTE — Evaluation (Signed)
Physical Therapy Evaluation Patient Details Name: LITTLE BASHORE MRN: [id number] DOB: Mar 27, 1932 Today's Date: 12/15/2014   History of Present Illness  81 y.o. female admitted with chest pain. Hx of CAD, COPD, and bilateral mastectomy.  Clinical Impression  Pt admitted with the above complications. Pt currently with functional limitations due to the deficits listed below (see PT Problem List). Reviewed therapeutic stretches for Rt pectoralis major and Rt upper trapezius fibers which did elicit the same pain sensation pt has been reporting. Ambulates with mild balance deficits while using a rolling walker however did not require any physical assist during ambulatory bout. Will greatly benefit from RW and HHPT for home safety evaluation and follow up. Pt will benefit from skilled PT to increase their independence and safety with mobility to allow discharge to the venue listed below.       Follow Up Recommendations Home health PT    Equipment Recommendations  Rolling walker with 5" wheels    Recommendations for Other Services       Precautions / Restrictions Precautions Precautions: None Restrictions Weight Bearing Restrictions: No      Mobility  Bed Mobility Overal bed mobility: Modified Independent             General bed mobility comments: Requires extra time  Transfers Overall transfer level: Needs assistance Equipment used: Rolling walker (2 wheeled) Transfers: Sit to/from Stand Sit to Stand: Supervision         General transfer comment: Supervision for safety. VC for hand placement. Good stability once holding RW for supprt. Did not require physical assist. Performed from lowest bed setting x3  Ambulation/Gait Ambulation/Gait assistance: Supervision Ambulation Distance (Feet): 125 Feet Assistive device: Rolling walker (2 wheeled) Gait Pattern/deviations: Step-through pattern;Decreased stride length;Leaning posteriorly;Scissoring;Narrow base of  support Gait velocity: decreased   General Gait Details: Educated on safe DME use with a rolling walker. Leans posteriorly on occasion but able to self correct. Cues to maintain Wb through UEs on RW. No buckling noted. HR in 70s. Scissoring noted with turns but stable with RW for support.  Stairs            Wheelchair Mobility    Modified Rankin (Stroke Patients Only)       Balance Overall balance assessment: Needs assistance Sitting-balance support: No upper extremity supported;Feet supported Sitting balance-Leahy Scale: Good     Standing balance support: No upper extremity supported;During functional activity Standing balance-Leahy Scale: Fair                               Pertinent Vitals/Pain Pain Assessment: 0-10 Pain Score: 6  Pain Location: Rt pectoralis major, Rt upper trap Pain Descriptors / Indicators: Sharp;Constant Pain Intervention(s): Monitored during session;Repositioned    Home Living Family/patient expects to be discharged to:: Private residence Living Arrangements: Alone Available Help at Discharge: Family;Other (Comment) (States son lives near-by but not available) Type of Home: Apartment Home Access: Stairs to enter Entrance Stairs-Rails: Left Entrance Stairs-Number of Steps: 5 Home Layout: One level Home Equipment: Cane - single point;Tub bench      Prior Function Level of Independence: Independent               Hand Dominance   Dominant Hand: Right    Extremity/Trunk Assessment   Upper Extremity Assessment: Defer to OT evaluation           Lower Extremity Assessment: Generalized weakness  Communication   Communication: No difficulties  Cognition Arousal/Alertness: Awake/alert Behavior During Therapy: WFL for tasks assessed/performed Overall Cognitive Status: Within Functional Limits for tasks assessed                      General Comments General comments (skin integrity, edema, etc.):  SpO2 97% on room air throughout entire therapy session. Balance greatly improved by RW. Pt initially stated she does not have any family in the area. Later stated she has a son near-by and a daughter that has passed away. RN states patient has a living daughter. Discussed this with RN to assess true family support available if needed. Orthostatic vitals taken and recorded in vitals tab.    Exercises Other Exercises Other Exercises: Pectoralis major stretch with hands behind head and elbows retraced x4 minutes. Reports feeling stretch in Rt pec similar to pain she has been experiencing. Other Exercises: Left lateral cervical spine flexion stretch x1 minute sitting edge of bed. Reports feeling a stretch of the muscle that has been causing her discomfort.      Assessment/Plan    PT Assessment Patient needs continued PT services  PT Diagnosis Abnormality of gait;Generalized weakness;Acute pain   PT Problem List Decreased strength;Decreased range of motion;Decreased activity tolerance;Decreased balance;Decreased mobility;Decreased knowledge of use of DME;Pain  PT Treatment Interventions DME instruction;Gait training;Stair training;Functional mobility training;Therapeutic activities;Therapeutic exercise;Balance training;Neuromuscular re-education;Patient/family education;Modalities   PT Goals (Current goals can be found in the Care Plan section) Acute Rehab PT Goals Patient Stated Goal: no pain PT Goal Formulation: With patient Time For Goal Achievement: 12/29/14 Potential to Achieve Goals: Good    Frequency Min 3X/week   Barriers to discharge Decreased caregiver support lives alone    Co-evaluation               End of Session Equipment Utilized During Treatment: Gait belt Activity Tolerance: Patient tolerated treatment well Patient left: in bed;with bed alarm set Nurse Communication: Mobility status         Time: 2703-5009 PT Time Calculation (min) (ACUTE ONLY): 36  min   Charges:   PT Evaluation $Initial PT Evaluation Tier I: 1 Procedure PT Treatments $Gait Training: 8-22 mins   PT G CodesEllouise Newer 12/15/2014, 2:24 PM Camille Bal Leon, [place]

## 2014-12-16 ENCOUNTER — Other Ambulatory Visit: Payer: Self-pay | Admitting: Oncology

## 2014-12-16 ENCOUNTER — Telehealth: Payer: Self-pay | Admitting: Oncology

## 2014-12-16 ENCOUNTER — Ambulatory Visit (HOSPITAL_COMMUNITY): Admission: RE | Admit: 2014-12-16 | Payer: Medicare Other | Source: Ambulatory Visit

## 2014-12-16 DIAGNOSIS — C50911 Malignant neoplasm of unspecified site of right female breast: Secondary | ICD-10-CM

## 2014-12-16 DIAGNOSIS — D0512 Intraductal carcinoma in situ of left breast: Secondary | ICD-10-CM | POA: Diagnosis not present

## 2014-12-16 DIAGNOSIS — R0789 Other chest pain: Secondary | ICD-10-CM

## 2014-12-16 DIAGNOSIS — G893 Neoplasm related pain (acute) (chronic): Secondary | ICD-10-CM

## 2014-12-16 DIAGNOSIS — M810 Age-related osteoporosis without current pathological fracture: Secondary | ICD-10-CM

## 2014-12-16 DIAGNOSIS — C7989 Secondary malignant neoplasm of other specified sites: Secondary | ICD-10-CM | POA: Diagnosis not present

## 2014-12-16 DIAGNOSIS — F419 Anxiety disorder, unspecified: Secondary | ICD-10-CM | POA: Diagnosis not present

## 2014-12-16 DIAGNOSIS — M5136 Other intervertebral disc degeneration, lumbar region: Secondary | ICD-10-CM

## 2014-12-16 DIAGNOSIS — R079 Chest pain, unspecified: Secondary | ICD-10-CM | POA: Diagnosis not present

## 2014-12-16 DIAGNOSIS — I25119 Atherosclerotic heart disease of native coronary artery with unspecified angina pectoris: Secondary | ICD-10-CM | POA: Diagnosis not present

## 2014-12-16 DIAGNOSIS — Z170 Estrogen receptor positive status [ER+]: Secondary | ICD-10-CM | POA: Diagnosis not present

## 2014-12-16 LAB — GLUCOSE, CAPILLARY: Glucose-Capillary: 78 mg/dL (ref 70–99)

## 2014-12-16 MED ORDER — OXYCODONE-ACETAMINOPHEN 10-325 MG PO TABS: 1.0000 | ORAL_TABLET | ORAL | Status: DC | PRN

## 2014-12-16 MED ORDER — SENNA 8.6 MG PO TABS
2.0000 | ORAL_TABLET | Freq: Every day | ORAL | Status: DC
  Administered 2014-12-16: 17.2 mg via ORAL
  Filled 2014-12-16: qty 2

## 2014-12-16 MED ORDER — OXYCODONE HCL ER 10 MG PO T12A
10.0000 mg | EXTENDED_RELEASE_TABLET | Freq: Two times a day (BID) | ORAL | Status: DC
  Administered 2014-12-16: 10 mg via ORAL
  Filled 2014-12-16: qty 1

## 2014-12-16 MED ORDER — IBUPROFEN 200 MG PO TABS
200.0000 mg | ORAL_TABLET | Freq: Three times a day (TID) | ORAL | Status: DC
  Administered 2014-12-16 (×2): 200 mg via ORAL
  Filled 2014-12-16 (×2): qty 1

## 2014-12-16 MED ORDER — OXYCODONE HCL ER 10 MG PO T12A: 10.0000 mg | EXTENDED_RELEASE_TABLET | Freq: Two times a day (BID) | ORAL | Status: DC

## 2014-12-16 NOTE — Progress Notes (Signed)
UR Completed Zorian Gunderman Graves-Bigelow, RN,BSN [phone]  

## 2014-12-16 NOTE — Telephone Encounter (Signed)
Called and left message with appointments  anne

## 2014-12-16 NOTE — Progress Notes (Signed)
CSW (Clinical Education officer, museum) asked to speak with pt about her concerns for dc home today. CSW visited pt room to discuss and pt on the phone during arrival. After pt finished on phone, CSW introduced self and explained role. Pt informed CSW she has ruled out option of SNF and will dc home today. Pt plans on calling her daughter-in-law Lattie Haw or son [name] to notify of dc and they will provide transportation. CSW made MD, RN, and RNCM aware. Pt has no further hospital social work needs.  [place], [place]

## 2014-12-16 NOTE — Discharge Summary (Signed)
Physician Discharge Summary  Angel [name] KZS:[id number] DOB: [birth year] DOA: 12/13/2014  PCP: [name], DO  Admit date: 12/13/2014 Discharge date: 12/16/2014  Time spent: 40 minutes  Recommendations for Outpatient Follow-up:  1. Follow up with Dr. Jana Hakim on 12/25/2014. 2. Please reassess the need for pain medications adjustment, also she does not have refills for OxyContin/Percocet  Discharge Diagnoses:  Principal Problem:   Chest pain Active Problems:   Asthma   DDD (degenerative disc disease), lumbar   Mixed hyperlipidemia   COPD   Essential hypertension   Prediabetes   CAD (coronary artery disease)   Breast cancer, right breast   GERD (gastroesophageal reflux disease)   Anxiety   Depression   Discharge Condition: Stable  Diet recommendation: Heart healthy  Filed Weights   12/13/14 2055 12/15/14 0500 12/16/14 0500  Weight: 60.238 kg (132 lb 12.8 oz) 62.143 kg (137 lb) 62.007 kg (136 lb 11.2 oz)    History of present illness:  Angel [name] is a very pleasant 81 y.o. female with past medical history of hypertension, hyperlipidemia, GERD, melanoma, COPD, depression, anxiety, hx of PVC, hx of recurrent right breast cancer (post status of bilateral mastectomy and radiation therapy,;urrently on letrozole), who presents with chest pain.  Patient reports that she has been having chest pain for more than one week, which has been progressively getting worse. Chest pain is pleuritic. It is aggravated by deep breath. It is sharp, constant, 9 out of 10 in severity, radiating to the right shoulder, neck and arm. Patient does not have fever or chills. She has chronic mild cough with minimal amount of sputum production, which she attributes to her COPD. Patient also has mild shortness of breath. It is not sure whether it is because of COPD or new chest pain. Of note, she had bilateral mastectomy because of breast cancer. Patient denies fever, chills, headaches, cough,  abdominal pain, diarrhea, constipation, dysuria, urgency, frequency, hematuria, skin rashes. No unilateral weakness, numbness or tingling sensations. No vision change or hearing loss.  In ED, patient was found to have negative chest x-ray for acute abnormalities. Initial troponin negative. BNP 61.6, no leukocytosis, temperature normal. CTA of chest is negative for pulmonary embolism, but showed Increased soft tissue thickening along the lower anterior right chest wall.    Hospital Course:   Chest pain Chest pain is likely musculoskeletal in nature, reproducible with very mild palpation. Pain along the right pectoralis major muscle, tenderness also at the both trapezius muscles. ACS ruled out by 3 sets of negative cardiac enzymes and negative EKG. CT angiography of the chest was done and showed no evidence of PE or parenchymal pulmonary disease. CT of the chest showed right sided chest wall thickening. Patient seen by Dr. Jana Hakim today, according to him she has breast cancer recurrence in her chest wall. Medications adjusted to OxyContin 10 mg twice a day and Percocet 10/325 every 6 hours as needed. Patient to follow-up with oncology as outpatient next week.   COPD and asthma Stable, no signs of acute exacerbation, no shortness of breath. Continue home bronchodilators.  Hypertension Non-medications at home, blood pressure was slightly elevated secondary to pain. Control pain and monitor.  Recurrent Breast cancer Status post bilateral mastectomy with radiation and chemotherapy. Recurrent breast cancer, causing pain on the right side of chest wall, treated with narcotics. Per oncology recommendations stop letrozole, Faslodex will be started as outpatient next week.  Depression and anxiety No suicidal or homicidal ideation, continue Cymbalta.   Procedures:  None  Consultations:  Oncology  Discharge Exam: Filed Vitals:   12/16/14 0500  BP: 102/42  Pulse: 64  Temp: 97.3 F  (36.3 C)  Resp: 20   General: Alert and awake, oriented x3, not in any acute distress. HEENT: anicteric sclera, pupils reactive to light and accommodation, EOMI CVS: S1-S2 clear, no murmur rubs or gallops Chest: clear to auscultation bilaterally, no wheezing, rales or rhonchi Abdomen: soft nontender, nondistended, normal bowel sounds, no organomegaly Extremities: no cyanosis, clubbing or edema noted bilaterally Neuro: Cranial nerves II-XII intact, no focal neurological deficits  Discharge Instructions    Current Discharge Medication List    START taking these medications   Details  OxyCODONE (OXYCONTIN) 10 mg T12A 12 hr tablet Take 1 tablet (10 mg total) by mouth every 12 (twelve) hours. Qty: 60 tablet, Refills: 0   Associated Diagnoses: COPD exacerbation; Breast cancer, right breast; Chest pain on breathing    oxyCODONE-acetaminophen (PERCOCET) 10-325 MG per tablet Take 1 tablet by mouth every 4 (four) hours as needed for pain. Qty: 30 tablet, Refills: 0      CONTINUE these medications which have NOT CHANGED   Details  albuterol (PROVENTIL HFA;VENTOLIN HFA) 108 (90 BASE) MCG/ACT inhaler Inhale into the lungs every 6 (six) hours as needed for wheezing or shortness of breath.    aspirin EC 81 MG tablet Take 1 tablet (81 mg total) by mouth daily.    B Complex-Biotin-FA (B-COMPLEX PO) Take 1 tablet by mouth daily.     Biotin 5000 MCG CAPS Take 5,000 mcg by mouth daily.    budesonide-formoterol (SYMBICORT) 160-4.5 MCG/ACT inhaler Inhale 2 puffs into the lungs 2 (two) times daily as needed. Qty: 3 Inhaler, Refills: 1    Calcium-Magnesium-Zinc 1000-400-15 MG TABS Take 1 tablet by mouth daily.      celecoxib (CELEBREX) 200 MG capsule Take 1 capsule daily with food for pain & inflammation Qty: 90 capsule, Refills: 3    Cholecalciferol (VITAMIN D-3) 5000 UNITS TABS Take 1 tablet by mouth daily.     diazepam (VALIUM) 5 MG tablet Take 0.5-1 tablets (2.5-5 mg total) by mouth every  8 (eight) hours as needed for anxiety. Qty: 45 tablet, Refills: 0   Associated Diagnoses: Chronic pain; Malignant neoplasm of right breast    DULoxetine (CYMBALTA) 60 MG capsule Take 1 capsule by mouth  every day for mood Qty: 90 capsule, Refills: 0    ezetimibe (ZETIA) 10 MG tablet Take 10 mg by mouth daily.    Fish Oil OIL Take 1 capsule by mouth daily.     Flaxseed, Linseed, (FLAX SEED OIL PO) Take 5 mLs by mouth daily. 1 tsp daily    isosorbide mononitrate (IMDUR) 30 MG 24 hr tablet Take 1 tablet (30 mg total) by mouth daily. Qty: 30 tablet, Refills: 3    Misc Natural Products (GLUCOSAMINE CHOND COMPLEX/MSM PO) Take 1 tablet by mouth daily.     montelukast (SINGULAIR) 10 MG tablet Take 1 tablet (10 mg total) by mouth at bedtime. Qty: 90 tablet, Refills: 2    Multiple Vitamin (MULTIVITAMIN) tablet Take 1 tablet by mouth daily.    nitroGLYCERIN (NITROSTAT) 0.4 MG SL tablet Place 1 tablet (0.4 mg total) under the tongue every 5 (five) minutes x 3 doses as needed for chest pain. Qty: 25 tablet, Refills: 3    ondansetron (ZOFRAN ODT) 4 MG disintegrating tablet Take 1 tablet (4 mg total) by mouth every 8 (eight) hours as needed for nausea or vomiting. Qty: 20  tablet, Refills: 0      STOP taking these medications     HYDROcodone-acetaminophen (NORCO) 10-325 MG per tablet      letrozole (FEMARA) 2.5 MG tablet      oxyCODONE (ROXICODONE) 5 MG immediate release tablet        Allergies  Allergen Reactions  . Penicillins Rash  . Adhesive [Tape] Other (See Comments)    Pt prefers to use paper tape   Follow-up Information    Follow up with [name], DO In 1 week.   Specialty:  Family Medicine   Contact information:   [address] [place] [address] [phone]       Follow up with [name], MD On 12/25/2014.   Specialty:  Oncology   Contact information:   [place] [address] [phone]        The results  of significant diagnostics from this hospitalization (including imaging, microbiology, ancillary and laboratory) are listed below for reference.    Significant Diagnostic Studies: Dg Chest 2 View  12/13/2014   CLINICAL DATA:  Shortness of breath, right chest pain  EXAM: CHEST  2 VIEW  COMPARISON:  12/06/2014  FINDINGS: Chronic interstitial markings/ emphysematous changes. Stable elevation of the right hemithorax. No pleural effusion or pneumothorax.  The heart is normal in size.  Degenerative changes of the thoracolumbar spine with scoliosis.  IMPRESSION: No evidence of acute cardiopulmonary disease.   Electronically Signed   By: Julian Hy M.D.   On: 12/13/2014 15:39   Ct Angio Chest Pe W/cm &/or Wo Cm  12/13/2014   CLINICAL DATA:  Shortness of breath and right-sided chest pain. History of breast cancer.  EXAM: CT ANGIOGRAPHY CHEST WITH CONTRAST  TECHNIQUE: Multidetector CT imaging of the chest was performed using the standard protocol during bolus administration of intravenous contrast. Multiplanar CT image reconstructions and MIPs were obtained to evaluate the vascular anatomy.  CONTRAST:  80 mg Omnipaque 350  COMPARISON:  02/15/2014  FINDINGS: No evidence for pulmonary embolism. There is no significant chest lymphadenopathy. No significant pericardial or pleural fluid. Surgical clips in the right axilla. Again noted is soft tissue thickening along the anterior lower right chest wall, best seen on sequence 4, image 60. The soft tissue thickening in this area now measures 1.3 cm and roughly measured 0.7 cm on the PET-CT from 02/15/2014. There is no gross abnormality in the adjacent ribs.  Atherosclerotic calcifications in the thoracic aorta. There is evidence for a gallstone but poorly characterized on this examination. Otherwise, images of the upper abdomen are unremarkable.  Trachea and mainstem bronchi are patent. There is chronic peripheral scarring in the right upper lobe. There is no  significant airspace disease or consolidation in either lung.  No acute bone abnormality.  Review of the MIP images confirms the above findings.  IMPRESSION: Negative for pulmonary embolism.  No acute lung abnormality.  Increased soft tissue thickening along the lower anterior right chest wall. Findings could be related to post treatment changes and recommend clinical correlation in this area.   Electronically Signed   By: Markus Daft M.D.   On: 12/13/2014 17:42   Dg Chest Port 1 View  12/06/2014   CLINICAL DATA:  Chest pain.  Breast cancer  EXAM: PORTABLE CHEST - 1 VIEW  COMPARISON:  01/20/2014  FINDINGS: The heart size and mediastinal contours are within normal limits. Both lungs are clear. The visualized skeletal structures are unremarkable. Chronic elevation right hemidiaphragm  unchanged.  IMPRESSION: No active disease.   Electronically Signed   By: Franchot Gallo M.D.   On: 12/06/2014 13:40    Microbiology: No results found for this or any previous visit (from the past 240 hour(s)).   Labs: Basic Metabolic Panel:  Recent Labs Lab 12/13/14 1525 12/14/14 0902 12/15/14 0430  NA 137 139 138  K 3.8 3.8 4.0  CL 104 107 104  CO2 27 25 30   GLUCOSE 138* 80 93  BUN 15 10 12   CREATININE 0.76 0.63 0.67  CALCIUM 9.7 8.7 8.9   Liver Function Tests:  Recent Labs Lab 12/13/14 1525  AST 23  ALT 14  ALKPHOS 48  BILITOT 0.4  PROT 6.8  ALBUMIN 3.9    Recent Labs Lab 12/13/14 2154  LIPASE 23   No results for input(s): AMMONIA in the last 168 hours. CBC:  Recent Labs Lab 12/13/14 1525 12/14/14 0902 12/15/14 0430  WBC 7.1 3.8* 4.3  HGB 13.5 12.1 12.3  HCT 40.5 36.6 37.9  MCV 94.6 94.6 96.4  PLT 218 174 187   Cardiac Enzymes:  Recent Labs Lab 12/13/14 2154 12/14/14 0250 12/14/14 0902  TROPONINI <0.03 <0.03 <0.03   BNP: BNP (last 3 results)  Recent Labs  12/06/14 1244 12/13/14 1525  BNP 95.4 61.6    ProBNP (last 3 results)  Recent Labs  01/17/14 1531   PROBNP 163.6    CBG:  Recent Labs Lab 12/14/14 0844 12/15/14 0809 12/16/14 0755  GLUCAP 75 70 78       Signed:  Rashada Klontz A  [facility] 12/16/2014, 11:46 AM

## 2014-12-16 NOTE — Care Management Note (Addendum)
    Page 1 of 2   12/16/2014     3:56:57 PM CARE MANAGEMENT NOTE 12/16/2014  Patient:  Angel [name], Angel [name]   Account Number:  [id number]  Date Initiated:  12/16/2014  Documentation initiated by:  GRAVES-BIGELOW,Michaiah Holsopple  Subjective/Objective Assessment:   Pt admitted for CP and SOB. Pt plan for d/c today. Agreeable to [facility] services with [facility].     Action/Plan:   Referral made to [facility] and [place] to begin within 24-48 hrs post d/c. DME RW to be delivered to room before d/c.   Anticipated DC Date:  12/16/2014   Anticipated DC Plan:  [place] [facility]  CM consult      [facility] Choice  HOME HEALTH  DURABLE MEDICAL EQUIPMENT   Choice offered to / List presented to:  C-1 Patient   DME arranged  [facility] Moselle      DME agency  [place] arranged  [place].   Status of service:  Completed, signed off Medicare Important Message given?  YES (If response is "NO", the following Medicare IM given date fields will be blank) Date Medicare IM given:  12/16/2014 Medicare IM given by:  GRAVES-BIGELOW,Thaddius Manes Date Additional Medicare IM given:   Additional Medicare IM given by:    Discharge Disposition:  [place]  Per UR Regulation:  Reviewed for med. necessity/level of care/duration of stay  If discussed at [place] of Stay Meetings, dates discussed:    Comments:  12-16-14 [place], RN,BSN [phone] CM received call from Staff RN stating that pt did not feel comfortable d/c home today. Pt is observation status. [place] services was set up previously. CM did ask pt would SNF be an option and CSW did speak with pt. Pt refusing SNF at this time. Per pt she will ask neighbors to look in on her periodically during the day. No further needs at this time.

## 2014-12-16 NOTE — Consult Note (Signed)
Angel [name]  [phone] [phone]) [phone]     ID: Angel [name] DOB: [birth year]  MR#: [id number]  FOY#:[id number]  Patient Care Team: [name], DO as PCP - General (Family Medicine) [name], MD as Consulting Physician (Oncology) [name], MD as Consulting Physician (Radiation Oncology) [name], MD as Consulting Physician (Cardiology) PCP: [name], DO OTHER MD:  CHIEF COMPLAINT: stage IV breast cancer, uncontrolled pain  CURRENT TREATMENT: discontinuing letrozole; to start fulvestrant as outpatient   BREAST CANCER HISTORY: Per Dr. 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 She had a second right breast cancer in 2012, with right mastectomy by Dr [name] 07-29-2011 for T1N0M0 ER/PR positive, HER-2 negative invasive ductal carcinoma; she also had what was planned as prophylactic left mastectomy, with unexpected finding of left DCIS in that pathology. Plan in 09-2011 was to begin tamoxifen, chosen particularly due to low bone density. Patient did not keep follow up visits at this office, stopped tamoxifen due intolerance (tho she does not remember what problem she had with this) and was treated with raloxifene by Dr [name].  She was seen next at this office on 12-26-2013 with an enlarging nodular area above right mastectomy scar,reportedly present for several months. Excisional biopsy done 01-21-14 found grade 2 invasive ductal carcinoma with involvement at [name]) and lymphovascular space invasion, ER positive 100%, PR positive 75%, proliferation marker 80% and HER-2 negative by [facility] (pathology [phone]). PET 02-15-14 did not identify any involvement beyond right chest wall. She had 4500 cGy as electron beam radiation from 5-27 thru 03-27-14 to right chest wall. By 04-30-14 there were multiple tiny nodules in  region of right mastectomy scar."  The patient's subsequent history is as detailed below  INTERVAL HISTORY: [name] had been experiencing gradually increasing R chest wall pain and pleurisy for some time before calling our APP to report it. Because of [name]'s cardiac history it was felt prudent to refer her to the [facility] and a direct admission was set up 12/13/2014. [name] ruled out for MI and a chest CT/ angio showed no PE. There was however increased soft tissue "thickening" along the lower R chest wall, consistent with tumor progression. Over the weekend the patient's pain has come under better controlled on multiple meds (celecoxib, gabapentin, robaxin, and several narcotics).   REVIEW OF SYSTEMS: Her pain is better controlled but she has not been getting out of bed. The pain localizes to the R chest wall anteriorly but there is also discomfort involving both shoulders. There is no one spot of pain but she points to a wide area. She has been having "normal" bowel movements despite the narcotics. Denies unusual h/a, N/V, cough/phlegm/hemoptysis. It still hurts when she takes a deep breath. A detailed ROS this AM was otherwise noncontributory.  PAST MEDICAL HISTORY: Past Medical History  Diagnosis Date  . Asthma   . Spinal stenosis   . Osteoporosis   . Chronic bronchitis   . DDD (degenerative disc disease)   . Osteoarthritis   . Degenerative joint disease   . Skin cancer of face     non melanoma  . Coronary artery disease Non-obstructive    a. 2012 Cath: LAD 40-38m>Med Rx;  b. 11/2013 Echo: EF 50-55%, nl wall motion;  c. 12/2013 Lexi MV: EF 74%, no ischemia.  . Depression   . Anxiety   . COPD (chronic obstructive pulmonary  disease)   . Dyslipidemia   . Cholelithiasis   . GERD (gastroesophageal reflux disease)   . Hx of radiation therapy 07/02/1999- 08/11/1999    right supraclavucular/axillary region: 5040 cGy, 28 fractions  . Breast cancer 05/07/1999, 06/2011    a. s/p bilat  mastectomies. recurrence 01/2014. Radiation, meds.  [name] Hx of radiation therapy 02/20/14- 03/27/14    right chest wall 4500 cGy 25 sessions  . Hypertension   . Kidney stones     one stones  . Cataract     eye surgery planned for 11/2014  . PVC's (premature ventricular contractions)   . Reflux esophagitis   . Pre-diabetes     PAST SURGICAL HISTORY: Past Surgical History  Procedure Laterality Date  . Vesicovaginal fistula closure w/ tah  age 72  . Tubal ligation  1961  . Appendectomy    . Abdominal hysterectomy      in her 30's  . Small intestine surgery  1998    [facility]  . Tonsillectomy    . Breast lumpectomy  2002    right breast  . Mastectomy  07/29/11    bilateral-rt nodes-none on left  . Cardiac catheterization  03/2011, 3/15  . Breast biopsy Right 01/21/2014    Procedure: BREAST/CHEST WALL BIOPSY;  Surgeon: [name], MD;  Location: [facility];  Service: General;  Laterality: Right;  . Vein ligation and stripping Right   . Left heart catheterization with coronary angiogram N/A 12/05/2013    Procedure: LEFT HEART CATHETERIZATION WITH CORONARY ANGIOGRAM;  Surgeon: [name], MD;  Location[facility] C[facility] LAB;  Service: Cardiovascular;  Laterality: N/A;  . Left heart catheterization with coronary angiogram N/A 11/06/2014    Procedure: LEFT HEART CATHETERIZATION WITH CORONARY ANGIOGRAM;  Surgeon: [name], MD;  Location: [facility] CATH LAB;  Service: Cardiovascular;  Laterality: N/A;    FAMILY HISTORY Family History  Problem Relation Age of Onset  . Pancreatic cancer Mother   . Cancer Mother     pancreatic  . Asthma Brother   . Asthma Daughter   . Stroke Daughter   . Asthma Grandchild   . Asthma Brother     deceased  . Heart disease Father   . Emphysema Father   . Heart disease Brother   . Lymphoma Brother     GYNECOLOGIC HISTORY:  Menarche age 69, first live birth age 88. She is GX P4. She stopped having periods in her 18s. She took hormone replacement for  approximately 10 years. 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 She has been "single" for 40 years. Her oldest daughter died from a subarachnoid hemorrhage at age 81. This is when the patient had undergone treatment for invasive breast cancer and she tells me she was simply overwhelmed and could not start the planned antiestrogen therapy. The next daughter, [name], is a retired Software engineer. She lives in [place]. Next child, Freda Munro, works in Charity fundraiser. She also lives in [place]. The youngest, [name], is retired from the Office manager position. He also owned his own business. The patient has 7 grandchildren and 2 many great-grandchildren to count. She attends the [employer]    ADVANCED DIRECTIVES: In place; the patient's daughter Freda Munro is her healthcare power of attorney. She can be reached at [place]: History  Substance Use Topics  . Smoking status: Never Smoker   . Smokeless  tobacco: Never Used  . Alcohol Use: Yes     Comment: occ wine     Allergies  Allergen Reactions  . Penicillins Rash  . Adhesive [Tape] Other (See Comments)    Pt prefers to use paper tape    Current Facility-Administered Medications  Medication Dose Route Frequency Provider Last Rate Last Dose  . albuterol (PROVENTIL) (2.5 MG/3ML) 0.083% nebulizer solution 3 mL  3 mL Inhalation Q6H PRN Ivor Costa, MD      . alum & mag hydroxide-simeth (MAALOX/MYLANTA) 200-200-20 MG/5ML suspension 30 mL  30 mL Oral Q6H PRN Ivor Costa, MD      . aspirin EC tablet 81 mg  81 mg Oral Daily Ivor Costa, MD   81 mg at 12/15/14 1024  . B-complex with vitamin C tablet 1 tablet  1 tablet Oral Daily Ivor Costa, MD   1 tablet at 12/15/14 1024  . budesonide-formoterol (SYMBICORT) 160-4.5 MCG/ACT inhaler 2 puff  2 puff Inhalation BID PRN Ivor Costa, MD      . cholecalciferol  (VITAMIN D) tablet 5,000 Units  5,000 Units Oral Daily Ivor Costa, MD   5,000 Units at 12/15/14 1024  . diazepam (VALIUM) tablet 2.5-5 mg  2.5-5 mg Oral Q8H PRN Ivor Costa, MD   5 mg at 12/14/14 2245  . DULoxetine (CYMBALTA) DR capsule 60 mg  60 mg Oral Daily Ivor Costa, MD   60 mg at 12/15/14 1024  . ezetimibe (ZETIA) tablet 10 mg  10 mg Oral QHS Ivor Costa, MD   10 mg at 12/15/14 2111  . gabapentin (NEURONTIN) tablet 300 mg  300 mg Oral TID Ivor Costa, MD   300 mg at 12/15/14 2111  . heparin injection 5,000 Units  5,000 Units Subcutaneous 3 times per day Ivor Costa, MD   5,000 Units at 12/16/14 0604  . ibuprofen (ADVIL,MOTRIN) tablet 200 mg  200 mg Oral TID WC [name], MD      . isosorbide mononitrate (IMDUR) 24 hr tablet 30 mg  30 mg Oral Daily Ivor Costa, MD   30 mg at 12/14/14 1455  . methocarbamol (ROBAXIN) tablet 500 mg  500 mg Oral TID Verlee Monte, MD   500 mg at 12/15/14 2112  . montelukast (SINGULAIR) tablet 10 mg  10 mg Oral QHS Ivor Costa, MD   10 mg at 12/15/14 2111  . multivitamin with minerals tablet 1 tablet  1 tablet Oral Daily Ivor Costa, MD   1 tablet at 12/15/14 1024  . nitroGLYCERIN (NITROSTAT) SL tablet 0.4 mg  0.4 mg Sublingual Q5 Min x 3 PRN Ivor Costa, MD      . omega-3 acid ethyl esters (LOVAZA) capsule 1 g  1 g Oral Daily Ivor Costa, MD   1 g at 12/15/14 1024  . OxyCODONE (OXYCONTIN) 12 hr tablet 10 mg  10 mg Oral Q12H [name], MD      . oxyCODONE-acetaminophen (PERCOCET/ROXICET) 5-325 MG per tablet 1-2 tablet  1-2 tablet Oral Q4H PRN Verlee Monte, MD   1 tablet at 12/16/14 0436  . pantoprazole (PROTONIX) EC tablet 40 mg  40 mg Oral Q1200 Ivor Costa, MD   40 mg at 12/15/14 1024  . polyethylene glycol (MIRALAX / GLYCOLAX) packet 17 g  17 g Oral Daily PRN Verlee Monte, MD      . senna (SENOKOT) tablet 17.2 mg  2 tablet Oral Daily [name], MD      . sodium chloride 0.9 % injection 3 mL  3 mL Intravenous Q12H Ivor Costa, MD   3 mL at 12/15/14 2113     OBJECTIVE: older White woman examined in bed Filed Vitals:   12/16/14 0500  BP: 102/42  Pulse: 64  Temp: 97.3 F (36.3 C)  Resp: 20     Body mass index is 25 kg/(m^2).    ECOG FS:3 - Symptomatic, >50% confined to bed  Lymphatic: No cervical or supraclavicular adenopathy Lungs no rales or rhonchi, auscultated anterolaterally Heart regular rate and rhythm Abd soft, nontender, positive bowel sounds, no masses palpated MSK palpation of the R chest wall anteriorly elicits no tenderness Neuro: well-oriented, appropriate affect  LAB RESULTS:  CMP     Component Value Date/Time   NA 138 12/15/2014 0430   NA 140 08/21/2014 1459   K 4.0 12/15/2014 0430   K 4.2 08/21/2014 1459   CL 104 12/15/2014 0430   CO2 30 12/15/2014 0430   CO2 28 08/21/2014 1459   GLUCOSE 93 12/15/2014 0430   GLUCOSE 116 08/21/2014 1459   BUN 12 12/15/2014 0430   BUN 11.6 08/21/2014 1459   CREATININE 0.67 12/15/2014 0430   CREATININE 0.8 08/21/2014 1459   CREATININE 0.78 05/01/2014 1050   CALCIUM 8.9 12/15/2014 0430   CALCIUM 10.5* 08/21/2014 1459   PROT 6.8 12/13/2014 1525   PROT 6.8 08/21/2014 1459   ALBUMIN 3.9 12/13/2014 1525   ALBUMIN 4.0 08/21/2014 1459   AST 23 12/13/2014 1525   AST 20 08/21/2014 1459   ALT 14 12/13/2014 1525   ALT 14 08/21/2014 1459   ALKPHOS 48 12/13/2014 1525   ALKPHOS 56 08/21/2014 1459   BILITOT 0.4 12/13/2014 1525   BILITOT 0.26 08/21/2014 1459   GFRNONAA 81* 12/15/2014 0430   GFRNONAA 73 05/01/2014 1050   GFRAA >90 12/15/2014 0430   GFRAA 84 05/01/2014 1050    INo results found for: SPEP, UPEP  Lab Results  Component Value Date   WBC 4.3 12/15/2014   NEUTROABS 3.1 11/05/2014   HGB 12.3 12/15/2014   HCT 37.9 12/15/2014   MCV 96.4 12/15/2014   PLT 187 12/15/2014    '@LASTCHEMISTRY' @  Lab Results  Component Value Date   LABCA2 20 01/31/2014    No components found for: LABCA125   Recent Labs Lab 12/13/14 2154  INR 1.05    Urinalysis     Component Value Date/Time   COLORURINE YELLOW 12/06/2014 [place] 12/06/2014 1355   LABSPEC 1.003* 12/06/2014 1355   PHURINE 6.0 12/06/2014 [place] 12/06/2014 [place] 12/06/2014 [place] 12/06/2014 [place] 12/06/2014 1355   PROTEINUR NEGATIVE 12/06/2014 1355   UROBILINOGEN 0.2 12/06/2014 1355   NITRITE NEGATIVE 12/06/2014 1355   LEUKOCYTESUR NEGATIVE 12/06/2014 1355    STUDIES: Dg Chest 2 View  12/13/2014   CLINICAL DATA:  Shortness of breath, right chest pain  EXAM: CHEST  2 VIEW  COMPARISON:  12/06/2014  FINDINGS: Chronic interstitial markings/ emphysematous changes. Stable elevation of the right hemithorax. No pleural effusion or pneumothorax.  The heart is normal in size.  Degenerative changes of the thoracolumbar spine with scoliosis.  IMPRESSION: No evidence of acute cardiopulmonary disease.   Electronically Signed   By: Julian Hy M.D.   On: 12/13/2014 15:39   Ct Angio Chest Pe W/cm &/or Wo Cm  12/13/2014   CLINICAL DATA:  Shortness of breath and right-sided chest pain. History of breast cancer.  EXAM: CT ANGIOGRAPHY CHEST WITH  CONTRAST  TECHNIQUE: Multidetector CT imaging of the chest was performed using the standard protocol during bolus administration of intravenous contrast. Multiplanar CT image reconstructions and MIPs were obtained to evaluate the vascular anatomy.  CONTRAST:  80 mg Omnipaque 350  COMPARISON:  02/15/2014  FINDINGS: No evidence for pulmonary embolism. There is no significant chest lymphadenopathy. No significant pericardial or pleural fluid. Surgical clips in the right axilla. Again noted is soft tissue thickening along the anterior lower right chest wall, best seen on sequence 4, image 60. The soft tissue thickening in this area now measures 1.3 cm and roughly measured 0.7 cm on the PET-CT from 02/15/2014. There is no gross abnormality in the adjacent ribs.  Atherosclerotic  calcifications in the thoracic aorta. There is evidence for a gallstone but poorly characterized on this examination. Otherwise, images of the upper abdomen are unremarkable.  Trachea and mainstem bronchi are patent. There is chronic peripheral scarring in the right upper lobe. There is no significant airspace disease or consolidation in either lung.  No acute bone abnormality.  Review of the MIP images confirms the above findings.  IMPRESSION: Negative for pulmonary embolism.  No acute lung abnormality.  Increased soft tissue thickening along the lower anterior right chest wall. Findings could be related to post treatment changes and recommend clinical correlation in this area.   Electronically Signed   By: Markus Daft M.D.   On: 12/13/2014 17:42   Dg Chest Port 1 View  12/06/2014   CLINICAL DATA:  Chest pain.  Breast cancer  EXAM: PORTABLE CHEST - 1 VIEW  COMPARISON:  01/20/2014  FINDINGS: The heart size and mediastinal contours are within normal limits. Both lungs are clear. The visualized skeletal structures are unremarkable. Chronic elevation right hemidiaphragm unchanged.  IMPRESSION: No active disease.   Electronically Signed   By: Franchot Gallo M.D.   On: 12/06/2014 13:40    ASSESSMENT: 81 y.o. [place] woman with locally recurrent breast cancer, as follows:  (1) status post right lumpectomy and sentinel node sampling September 2000 for ductal carcinoma in situ (Ni+), estrogen and progesterone receptor positive, with a low proliferation index, (a) received 5040 cGy to the right breast and regional lymph nodes (b) on tamoxifen less than 3 months  (2) s/p bilateral mastectomies and right axillary lymph node sampling 07/29/2011, showing (a) in the left breast, ductal carcinoma in situ measuring 1.2 cm, grade 1, with negative margins, estrogen and progesterone receptor positive (b) on the right, a pT1c pN0, stage IA invasive ductal carcinoma,  estrogen receptor and 93% positive, progesterone receptor 87% positive, with an MIB-1 of 35%, and no HER-2 amplification margins were close but negative (c) did not receive adjuvant antiestrogen therapy  (3) right chest wall skin recurrence resected 01/21/2014, measuring 1.7 cm, with positive margins, estrogen receptor 100% positive, progesterone receptor 75% positive, with an MIB-1 of 80%, and no HER-2 amplification; PET scan 02/15/2014 negative except for Right chest wall focus (a) received 45 Gy electron-beam therapy to the right chest wall completed 03/27/2014 (b) skin progression within and without the radiation field noted 04/30/2014 (c) letrozole started 05/13/2014--discontinued 12/16/2014 with progression  (4) to start fulvestrant last week in March 2016  (5) chest wall pain due to tumor: not a candidate for further radiation treatment  (a) continue to optimize non-narcotic analgesics   (b) start long acting/ short acting oxycodone  (c) bowel prophylaxis.  (6) dexa scan 05/09/2014 at [facility] shows osteoporosis with a T score of -2.9  PLAN: I  discussed the overall situation with Pamala Hurry. She understands the breast cancer is growing despite her being on letrozole. The cancer in her chest wall is what is causing the pain--she was reassured this is not cardiac. Unfortunately she has already received radiation to this area and I do not believe further local therapy will be possible. At this point we are stopping the letrozole and setting her up for monthly fulvestrant, to start next week, after an outpatient PET scan--we will set that up and contact the patient with dates and times.  Today I adjusted her pain meds marginally--changed from celecoxib to advil as patient will not be able to afford the more expensive medication (if she will be in the hospital several more days and there is concern re bleeding can change back to  celecoxib until d/c). I discussed long-acting narcotics with her and I think she will benefit from oxycontin 10 mg BID, in addition to her PRN percocets--please ask SW to make sure patient will be able to afford this; if there is a concern we may be able to assist the patient through the [facility]. She is on a good bowel prophylaxis which I would continue at d/c.  Greatly appreciate the hospitalist service's help to this patient! She was especially grateful she spent only 10 minutes in the ED!  Please let me know if I can be of further help.   [name], MD   12/16/2014 7:00 AM Medical Oncology and Hematology [facility] [address] [place], [place] [address] Tel. [phone]    Fax. [phone]

## 2014-12-16 NOTE — Discharge Instructions (Signed)
Cardiac Diet This diet can help prevent heart disease and stroke. Many factors influence your heart health, including eating and exercise habits. Coronary risk rises a lot with abnormal blood fat (lipid) levels. Cardiac meal planning includes limiting unhealthy fats, increasing healthy fats, and making other small dietary changes. General guidelines are as follows:  Adjust calorie intake to reach and maintain desirable body weight.  Limit total fat intake to less than 30% of total calories. Saturated fat should be less than 7% of calories.  Saturated fats are found in animal products and in some vegetable products. Saturated vegetable fats are found in coconut oil, cocoa butter, palm oil, and palm kernel oil. Read labels carefully to avoid these products as much as possible. Use butter in moderation. Choose tub margarines and oils that have 2 grams of fat or less. Good cooking oils are canola and olive oils.  Practice low-fat cooking techniques. Do not fry food. Instead, broil, bake, boil, steam, grill, roast on a rack, stir-fry, or microwave it. Other fat reducing suggestions include:  Remove the skin from poultry.  Remove all visible fat from meats.  Skim the fat off stews, soups, and gravies before serving them.  Steam vegetables in water or broth instead of sauting them in fat.  Avoid foods with trans fat (or hydrogenated oils), such as commercially fried foods and commercially baked goods. Commercial shortening and deep-frying fats will contain trans fat.  Increase intake of fruits, vegetables, whole grains, and legumes to replace foods high in fat.  Increase consumption of nuts, legumes, and seeds to at least 4 servings weekly. One serving of a legume equals  cup, and 1 serving of nuts or seeds equals  cup.  Choose whole grains more often. Have 3 servings per day (a serving is 1 ounce [oz]).  Eat 4 to 5 servings of vegetables per day. A serving of vegetables is 1 cup of raw leafy  vegetables;  cup of raw or cooked cut-up vegetables;  cup of vegetable juice.  Eat 4 to 5 servings of fruit per day. A serving of fruit is 1 medium whole fruit;  cup of dried fruit;  cup of fresh, frozen, or canned fruit;  cup of 100% fruit juice.  Increase your intake of dietary fiber to 20 to 30 grams per day. Insoluble fiber may help lower your risk of heart disease and may help curb your appetite.  Soluble fiber binds cholesterol to be removed from the blood. Foods high in soluble fiber are dried beans, citrus fruits, oats, apples, bananas, broccoli, Brussels sprouts, and eggplant.  Try to include foods fortified with plant sterols or stanols, such as yogurt, breads, juices, or margarines. Choose several fortified foods to achieve a daily intake of 2 to 3 grams of plant sterols or stanols.  Foods with omega-3 fats can help reduce your risk of heart disease. Aim to have a 3.5 oz portion of fatty fish twice per week, such as salmon, mackerel, albacore tuna, sardines, lake trout, or herring. If you wish to take a fish oil supplement, choose one that contains 1 gram of both DHA and EPA.  Limit processed meats to 2 servings (3 oz portion) weekly.  Limit the sodium in your diet to 1500 milligrams (mg) per day. If you have high blood pressure, talk to a registered dietitian about a DASH (Dietary Approaches to Stop Hypertension) eating plan.  Limit sweets and beverages with added sugar, such as soda, to no more than 5 servings per week. One   serving is:   1 tablespoon sugar.  1 tablespoon jelly or jam.   cup sorbet.  1 cup lemonade.   cup regular soda. CHOOSING FOODS Starches  Allowed: Breads: All kinds (wheat, rye, raisin, white, oatmeal, Italian, French, and English muffin bread). Low-fat rolls: English muffins, frankfurter and hamburger buns, bagels, pita bread, tortillas (not fried). Pancakes, waffles, biscuits, and muffins made with recommended oil.  Avoid: Products made with  saturated or trans fats, oils, or whole milk products. Butter rolls, cheese breads, croissants. Commercial doughnuts, muffins, sweet rolls, biscuits, waffles, pancakes, store-bought mixes. Crackers  Allowed: Low-fat crackers and snacks: Animal, graham, rye, saltine (with recommended oil, no lard), oyster, and matzo crackers. Bread sticks, melba toast, rusks, flatbread, pretzels, and light popcorn.  Avoid: High-fat crackers: cheese crackers, butter crackers, and those made with coconut, palm oil, or trans fat (hydrogenated oils). Buttered popcorn. Cereals  Allowed: Hot or cold whole-grain cereals.  Avoid: Cereals containing coconut, hydrogenated vegetable fat, or animal fat. Potatoes / Pasta / Rice  Allowed: All kinds of potatoes, rice, and pasta (such as macaroni, spaghetti, and noodles).  Avoid: Pasta or rice prepared with cream sauce or high-fat cheese. Chow mein noodles, French fries. Vegetables  Allowed: All vegetables and vegetable juices.  Avoid: Fried vegetables. Vegetables in cream, butter, or high-fat cheese sauces. Limit coconut. Fruit in cream or custard. Protein  Allowed: Limit your intake of meat, seafood, and poultry to no more than 6 oz (cooked weight) per day. All lean, well-trimmed beef, veal, pork, and lamb. All chicken and turkey without skin. All fish and shellfish. Wild game: wild duck, rabbit, pheasant, and venison. Egg whites or low-cholesterol egg substitutes may be used as desired. Meatless dishes: recipes with dried beans, peas, lentils, and tofu (soybean curd). Seeds and nuts: all seeds and most nuts.  Avoid: Prime grade and other heavily marbled and fatty meats, such as short ribs, spare ribs, rib eye roast or steak, frankfurters, sausage, bacon, and high-fat luncheon meats, mutton. Caviar. Commercially fried fish. Domestic duck, goose, venison sausage. Organ meats: liver, gizzard, heart, chitterlings, brains, kidney, sweetbreads. Dairy  Allowed: Low-fat  cheeses: nonfat or low-fat cottage cheese (1% or 2% fat), cheeses made with part skim milk, such as mozzarella, farmers, string, or ricotta. (Cheeses should be labeled no more than 2 to 6 grams fat per oz.). Skim (or 1%) milk: liquid, powdered, or evaporated. Buttermilk made with low-fat milk. Drinks made with skim or low-fat milk or cocoa. Chocolate milk or cocoa made with skim or low-fat (1%) milk. Nonfat or low-fat yogurt.  Avoid: Whole milk cheeses, including colby, cheddar, muenster, Monterey Jack, Havarti, Brie, Camembert, American, Swiss, and blue. Creamed cottage cheese, cream cheese. Whole milk and whole milk products, including buttermilk or yogurt made from whole milk, drinks made from whole milk. Condensed milk, evaporated whole milk, and 2% milk. Soups and Combination Foods  Allowed: Low-fat low-sodium soups: broth, dehydrated soups, homemade broth, soups with the fat removed, homemade cream soups made with skim or low-fat milk. Low-fat spaghetti, lasagna, chili, and Spanish rice if low-fat ingredients and low-fat cooking techniques are used.  Avoid: Cream soups made with whole milk, cream, or high-fat cheese. All other soups. Desserts and Sweets  Allowed: Sherbet, fruit ices, gelatins, meringues, and angel food cake. Homemade desserts with recommended fats, oils, and milk products. Jam, jelly, honey, marmalade, sugars, and syrups. Pure sugar candy, such as gum drops, hard candy, jelly beans, marshmallows, mints, and small amounts of dark chocolate.  Avoid: Commercially prepared   cakes, pies, cookies, frosting, pudding, or mixes for these products. Desserts containing whole milk products, chocolate, coconut, lard, palm oil, or palm kernel oil. Ice cream or ice cream drinks. Candy that contains chocolate, coconut, butter, hydrogenated fat, or unknown ingredients. Buttered syrups. Fats and Oils  Allowed: Vegetable oils: safflower, sunflower, corn, soybean, cottonseed, sesame, canola, olive,  or peanut. Non-hydrogenated margarines. Salad dressing or mayonnaise: homemade or commercial, made with a recommended oil. Low or nonfat salad dressing or mayonnaise.  Limit added fats and oils to 6 to 8 tsp per day (includes fats used in cooking, baking, salads, and spreads on bread). Remember to count the "hidden fats" in foods.  Avoid: Solid fats and shortenings: butter, lard, salt pork, bacon drippings. Gravy containing meat fat, shortening, or suet. Cocoa butter, coconut. Coconut oil, palm oil, palm kernel oil, or hydrogenated oils: these ingredients are often used in bakery products, nondairy creamers, whipped toppings, candy, and commercially fried foods. Read labels carefully. Salad dressings made of unknown oils, sour cream, or cheese, such as blue cheese and Roquefort. Cream, all kinds: half-and-half, light, heavy, or whipping. Sour cream or cream cheese (even if "light" or low-fat). Nondairy cream substitutes: coffee creamers and sour cream substitutes made with palm, palm kernel, hydrogenated oils, or coconut oil. Beverages  Allowed: Coffee (regular or decaffeinated), tea. Diet carbonated beverages, mineral water. Alcohol: Check with your caregiver. Moderation is recommended.  Avoid: Whole milk, regular sodas, and juice drinks with added sugar. Condiments  Allowed: All seasonings and condiments. Cocoa powder. "Cream" sauces made with recommended ingredients.  Avoid: Carob powder made with hydrogenated fats. SAMPLE MENU Breakfast   cup orange juice   cup oatmeal  1 slice toast  1 tsp margarine  1 cup skim milk Lunch  Turkey sandwich with 2 oz turkey, 2 slices bread  Lettuce and tomato slices  Fresh fruit  Carrot sticks  Coffee or tea Snack  Fresh fruit or low-fat crackers Dinner  3 oz lean ground beef  1 baked potato  1 tsp margarine   cup asparagus  Lettuce salad  1 tbs non-creamy dressing   cup peach slices  1 cup skim milk Document Released:  06/22/2008 Document Revised: 03/14/2012 Document Reviewed: 11/13/2013 ExitCare Patient Information 2015 [employer]. This information is not intended to replace advice given to you by your health care provider. Make sure you discuss any questions you have with your health care provider.  

## 2014-12-17 ENCOUNTER — Telehealth: Payer: Self-pay | Admitting: *Deleted

## 2014-12-17 ENCOUNTER — Telehealth: Payer: Self-pay | Admitting: Family Medicine

## 2014-12-17 ENCOUNTER — Telehealth: Payer: Self-pay | Admitting: Oncology

## 2014-12-17 ENCOUNTER — Other Ambulatory Visit: Payer: Self-pay | Admitting: Oncology

## 2014-12-17 DIAGNOSIS — R260 Ataxic gait: Secondary | ICD-10-CM | POA: Diagnosis not present

## 2014-12-17 NOTE — Telephone Encounter (Signed)
She is seeing cardiology and oncology regularly so no--- I originally said 6 months --- that would be July

## 2014-12-17 NOTE — Telephone Encounter (Signed)
Caller name: Kerry Dory from [facility]  Relation to pt: [facility]  Call back number: [phone] ext [phone]   Reason for call:  Requesting verbal orders for Physical Therapist and Home Aide due to patient being discharged from [facility]

## 2014-12-17 NOTE — Telephone Encounter (Signed)
Patient discharged from hospital on 12/16/14 (admitted for chest pain/ recurrence of breast CA).  Patient AVS states to follow up with Dr. Griffith Citron- patient scheduled for appointment on 12/25/14.  Would you like to follow up with her as well?

## 2014-12-17 NOTE — Telephone Encounter (Signed)
Detailed message left for Angel [name] on the Vm for PT and Home aide.    KP

## 2014-12-17 NOTE — Telephone Encounter (Signed)
per pof ot sch pt appt-cld * spoke to pt to adv of time & date of appt-pt understood

## 2014-12-18 ENCOUNTER — Telehealth: Payer: Self-pay | Admitting: Family Medicine

## 2014-12-18 NOTE — Telephone Encounter (Signed)
This medication was already sent to mail order pharmacy on 12/13/14.  Attempted to call patient to notify.  LMOM.

## 2014-12-18 NOTE — Telephone Encounter (Signed)
Caller name: Lenna Sciara a Tourist information centre manager with [employer] Relation to pt: Call back number: [phone] ext [phone] Pharmacy: [facility] on [address]   Reason for call:   Requesting celebrex refill for patient

## 2014-12-19 DIAGNOSIS — C50911 Malignant neoplasm of unspecified site of right female breast: Secondary | ICD-10-CM | POA: Diagnosis not present

## 2014-12-19 DIAGNOSIS — G893 Neoplasm related pain (acute) (chronic): Secondary | ICD-10-CM | POA: Diagnosis not present

## 2014-12-23 ENCOUNTER — Ambulatory Visit: Payer: Medicare Other | Admitting: Family Medicine

## 2014-12-24 ENCOUNTER — Ambulatory Visit (HOSPITAL_COMMUNITY)
Admission: RE | Admit: 2014-12-24 | Discharge: 2014-12-24 | Disposition: A | Discharge status: Home / Self Care | Payer: Medicare Other | Source: Ambulatory Visit | Attending: Oncology | Admitting: Oncology

## 2014-12-24 ENCOUNTER — Telehealth: Payer: Self-pay | Admitting: *Deleted

## 2014-12-24 DIAGNOSIS — C50911 Malignant neoplasm of unspecified site of right female breast: Secondary | ICD-10-CM | POA: Insufficient documentation

## 2014-12-24 DIAGNOSIS — Z853 Personal history of malignant neoplasm of breast: Secondary | ICD-10-CM | POA: Diagnosis not present

## 2014-12-24 DIAGNOSIS — Z9889 Other specified postprocedural states: Secondary | ICD-10-CM | POA: Diagnosis not present

## 2014-12-24 LAB — GLUCOSE, CAPILLARY: Glucose-Capillary: 95 mg/dL (ref 70–99)

## 2014-12-24 MED ORDER — FLUDEOXYGLUCOSE F - 18 (FDG) INJECTION
6.8000 | Freq: Once | INTRAVENOUS | Status: AC | PRN
  Administered 2014-12-24: 6.8 via INTRAVENOUS

## 2014-12-24 NOTE — Telephone Encounter (Signed)
Per Dr. Jana Hakim, please inform patient that PET scan results look better. Patient verbalized understanding. Cancelled appointment on 12/25/14 with Dr. Jana Hakim.

## 2014-12-25 ENCOUNTER — Ambulatory Visit: Payer: Medicare Other | Admitting: Oncology

## 2014-12-25 ENCOUNTER — Other Ambulatory Visit (HOSPITAL_BASED_OUTPATIENT_CLINIC_OR_DEPARTMENT_OTHER): Payer: Medicare Other

## 2014-12-25 ENCOUNTER — Ambulatory Visit (HOSPITAL_BASED_OUTPATIENT_CLINIC_OR_DEPARTMENT_OTHER): Payer: Medicare Other

## 2014-12-25 DIAGNOSIS — D0512 Intraductal carcinoma in situ of left breast: Secondary | ICD-10-CM

## 2014-12-25 DIAGNOSIS — Z853 Personal history of malignant neoplasm of breast: Secondary | ICD-10-CM | POA: Diagnosis not present

## 2014-12-25 DIAGNOSIS — M810 Age-related osteoporosis without current pathological fracture: Secondary | ICD-10-CM

## 2014-12-25 DIAGNOSIS — C44501 Unspecified malignant neoplasm of skin of breast: Secondary | ICD-10-CM

## 2014-12-25 DIAGNOSIS — C50811 Malignant neoplasm of overlapping sites of right female breast: Secondary | ICD-10-CM | POA: Diagnosis not present

## 2014-12-25 DIAGNOSIS — G8929 Other chronic pain: Secondary | ICD-10-CM

## 2014-12-25 DIAGNOSIS — C50911 Malignant neoplasm of unspecified site of right female breast: Secondary | ICD-10-CM

## 2014-12-25 DIAGNOSIS — Z5111 Encounter for antineoplastic chemotherapy: Secondary | ICD-10-CM

## 2014-12-25 DIAGNOSIS — Z170 Estrogen receptor positive status [ER+]: Secondary | ICD-10-CM

## 2014-12-25 LAB — COMPREHENSIVE METABOLIC PANEL (CC13)
ALT: 13 U/L (ref 0–55)
AST: 16 U/L (ref 5–34)
Albumin: 3.6 g/dL (ref 3.5–5.0)
Alkaline Phosphatase: 65 U/L (ref 40–150)
Anion Gap: 11 mEq/L (ref 3–11)
BILIRUBIN TOTAL: 0.37 mg/dL (ref 0.20–1.20)
BUN: 13.3 mg/dL (ref 7.0–26.0)
CHLORIDE: 99 meq/L (ref 98–109)
CO2: 28 mEq/L (ref 22–29)
CREATININE: 0.8 mg/dL (ref 0.6–1.1)
Calcium: 9.2 mg/dL (ref 8.4–10.4)
EGFR: 72 mL/min/{1.73_m2} — ABNORMAL LOW (ref >90)
GLUCOSE: 114 mg/dL (ref 70–140)
Potassium: 4.3 mEq/L (ref 3.5–5.1)
SODIUM: 138 meq/L (ref 136–145)
TOTAL PROTEIN: 6.3 g/dL — AB (ref 6.4–8.3)

## 2014-12-25 LAB — CBC WITH DIFFERENTIAL/PLATELET
BASO%: 0.6 % (ref 0.0–2.0)
Basophils Absolute: 0 10*3/uL (ref 0.0–0.1)
EOS%: 1.1 % (ref 0.0–7.0)
Eosinophils Absolute: 0.1 10*3/uL (ref 0.0–0.5)
HEMATOCRIT: 38.2 % (ref 34.8–46.6)
HGB: 12.5 g/dL (ref 11.6–15.9)
LYMPH#: 2.2 10*3/uL (ref 0.9–3.3)
LYMPH%: 36.3 % (ref 14.0–49.7)
MCH: 30.5 pg (ref 25.1–34.0)
MCHC: 32.9 g/dL (ref 31.5–36.0)
MCV: 92.9 fL (ref 79.5–101.0)
MONO#: 0.4 10*3/uL (ref 0.1–0.9)
MONO%: 6.8 % (ref 0.0–14.0)
NEUT#: 3.3 10*3/uL (ref 1.5–6.5)
NEUT%: 55.2 % (ref 38.4–76.8)
PLATELETS: 245 10*3/uL (ref 145–400)
RBC: 4.11 10*6/uL (ref 3.70–5.45)
RDW: 12.6 % (ref 11.2–14.5)
WBC: 5.9 10*3/uL (ref 3.9–10.3)

## 2014-12-25 MED ORDER — FULVESTRANT 250 MG/5ML IM SOLN
500.0000 mg | INTRAMUSCULAR | Status: DC
  Administered 2014-12-25: 500 mg via INTRAMUSCULAR
  Filled 2014-12-25: qty 10

## 2014-12-25 NOTE — Patient Instructions (Signed)
Fulvestrant injection  What is this medicine?  FULVESTRANT (ful VES trant) blocks the effects of estrogen. It is used to treat breast cancer in women past the age of menopause.  This medicine may be used for other purposes; ask your health care provider or pharmacist if you have questions.  COMMON BRAND NAME(S): FASLODEX  What should I tell my health care provider before I take this medicine?  They need to know if you have any of these conditions:  -bleeding problems  -liver disease  -low levels of platelets in the blood  -an unusual or allergic reaction to fulvestrant, other medicines, foods, dyes, or preservatives  -pregnant or trying to get pregnant  -breast-feeding  How should I use this medicine?  This medicine is for injection into a muscle. It is usually given by a health care professional in a hospital or clinic setting.  Talk to your pediatrician regarding the use of this medicine in children. Special care may be needed.  Overdosage: If you think you have taken too much of this medicine contact a poison control center or emergency room at once.  NOTE: This medicine is only for you. Do not share this medicine with others.  What if I miss a dose?  It is important not to miss your dose. Call your doctor or health care professional if you are unable to keep an appointment.  What may interact with this medicine?  -medicines that treat or prevent blood clots like warfarin, enoxaparin, and dalteparin  This list may not describe all possible interactions. Give your health care provider a list of all the medicines, herbs, non-prescription drugs, or dietary supplements you use. Also tell them if you smoke, drink alcohol, or use illegal drugs. Some items may interact with your medicine.  What should I watch for while using this medicine?  Your condition will be monitored carefully while you are receiving this medicine. You will need important blood work done while you are taking this medicine.  Do not become pregnant  while taking this medicine. Women should inform their doctor if they wish to become pregnant or think they might be pregnant. There is a potential for serious side effects to an unborn child. Talk to your health care professional or pharmacist for more information.  What side effects may I notice from receiving this medicine?  Side effects that you should report to your doctor or health care professional as soon as possible:  -allergic reactions like skin rash, itching or hives, swelling of the face, lips, or tongue  -feeling faint or lightheaded, falls  -fever or flu-like symptoms  -sore throat  -vaginal bleeding  Side effects that usually do not require medical attention (report to your doctor or health care professional if they continue or are bothersome):  -aches, pains  -constipation or diarrhea  -headache  -hot flashes  -nausea, vomiting  -pain at site where injected  -stomach pain  This list may not describe all possible side effects. Call your doctor for medical advice about side effects. You may report side effects to FDA at 1-800-FDA-1088.  Where should I keep my medicine?  This drug is given in a hospital or clinic and will not be stored at home.  NOTE: This sheet is a summary. It may not cover all possible information. If you have questions about this medicine, talk to your doctor, pharmacist, or health care provider.   2015, Elsevier/Gold Standard. (2008-01-22 15:39:24)

## 2014-12-27 ENCOUNTER — Other Ambulatory Visit: Payer: Self-pay | Admitting: *Deleted

## 2014-12-27 ENCOUNTER — Telehealth: Payer: Self-pay

## 2014-12-27 ENCOUNTER — Telehealth: Payer: Self-pay | Admitting: *Deleted

## 2014-12-27 NOTE — Telephone Encounter (Signed)
Pt called stating her daughter is pharmacist and she suggested flexeril to help her mother feel better. Pt is generally achey and flu like. Pt had faslodex shot 3/30. Informed pt that a muscle relaxer will not help with side effects of faslodex. Pt said her percocet does help. Instructed her to continue with percocet. Call if any fever or worsening symptoms.

## 2014-12-27 NOTE — Telephone Encounter (Signed)
Patient's daughter [name] called wanting to know about the injections that her mother was receiving as the patient did not know. Advised daughter about Faslodex injections, purpose and side effects. Daughter verbalized understanding.

## 2014-12-31 ENCOUNTER — Encounter: Payer: Self-pay | Admitting: Oncology

## 2014-12-31 NOTE — Progress Notes (Signed)
Patient had left a message and I called her back. She said she had stopped auto draft so we can pay her power bill. I told her once she gets bill to get to [place] to pay.

## 2014-12-31 NOTE — Progress Notes (Signed)
I placed the form for asst with [name] on the desk of nurse for Dr. Jana Hakim

## 2015-01-01 ENCOUNTER — Telehealth: Payer: Self-pay | Admitting: *Deleted

## 2015-01-01 NOTE — Telephone Encounter (Signed)
Received home health certification and plan of care via fax from [facility]. Forwarded to Dr. Lowne. JG//CMA 

## 2015-01-01 NOTE — Telephone Encounter (Signed)
Daughter Cathie Hoops called scheduler.  Call transferred from scheduler.   [name] very talkative about mom's side effects from injection.  "Mom receives medicine to prevent estrogen production.  She has experienced pain in chest, hospitalized for four days due to pain, sleeps a lot and not feeling well.  Doesn't look good.  Took oxycodone 10 mg every 4 hours instead of every twelve but I went to her house and straightened out her medicines.  I went by one night and spent the night with her and she slept fro 5:00 pm until 11:00 am "  This nurse asked if Ms. Sievers needs of a sooner appointment.  "No I just need to know when mom's are for April.  I [name]) needs to make an appointment in April and does not want any conflict with mom's appointments.  All future appointment dates and times given with this call.  Informed her mom is sleeping a lot because she took the long acting pain meds too close together.  Will notify providers of pain medicine use as she will run out sooner than what was provided with hospitalization.

## 2015-01-02 ENCOUNTER — Other Ambulatory Visit: Payer: Self-pay

## 2015-01-02 DIAGNOSIS — G893 Neoplasm related pain (acute) (chronic): Secondary | ICD-10-CM | POA: Diagnosis not present

## 2015-01-02 DIAGNOSIS — C50911 Malignant neoplasm of unspecified site of right female breast: Secondary | ICD-10-CM | POA: Diagnosis not present

## 2015-01-02 MED ORDER — OXYCODONE-ACETAMINOPHEN 10-325 MG PO TABS: 1.0000 | ORAL_TABLET | ORAL | Status: DC | PRN

## 2015-01-02 NOTE — Telephone Encounter (Signed)
Pt called stating she needs refill on a medication she was prescribed in ED. States she is unsure of which pain medication it was because she placed them in a pill holder. Informed pt she was prescribed Percocet and Oxycotin, that the Oxycontin is not due to be refilled yet. Pt verbalized understanding pt states she received neulasta shot a couple days ago and her body is sore. Denies any fever or any other symptoms. Informed pt this nurse would check with her MD and prescription would be ready for her to pick up from [facility] tomorrow. Pt states she will pick up tomorrow after lunch. Verbal order from Dr. Jana Hakim for Percocet received, prescription given to [facility] for signature.

## 2015-01-03 ENCOUNTER — Telehealth: Payer: Self-pay | Admitting: Family Medicine

## 2015-01-03 ENCOUNTER — Other Ambulatory Visit: Payer: Self-pay | Admitting: *Deleted

## 2015-01-03 ENCOUNTER — Encounter: Payer: Self-pay | Admitting: Oncology

## 2015-01-03 DIAGNOSIS — C50911 Malignant neoplasm of unspecified site of right female breast: Secondary | ICD-10-CM | POA: Diagnosis not present

## 2015-01-03 DIAGNOSIS — G893 Neoplasm related pain (acute) (chronic): Secondary | ICD-10-CM | POA: Diagnosis not present

## 2015-01-03 MED ORDER — CITALOPRAM HYDROBROMIDE 10 MG PO TABS: 10.0000 mg | ORAL_TABLET | Freq: Every day | ORAL | Status: DC

## 2015-01-03 NOTE — Telephone Encounter (Signed)
Rx faxed, Unable to leave a message because VM is full.      KP

## 2015-01-03 NOTE — Progress Notes (Signed)
I faxed forms to [name]  [phone]

## 2015-01-03 NOTE — Telephone Encounter (Signed)
Caller name: Lenna Sciara, case Freight forwarder with [employer] Relation to pt: Call back number: [phone] ext [phone] [facility]acy[facility] on [address]  Reason for call:   Is on the phone for patient and would like to know if celexa could be called in for patient. She states that patient is going through a difficult time right now. Melissa states that patient has been diagnosed with cancer.

## 2015-01-03 NOTE — Telephone Encounter (Signed)
Please review and advise     KP 

## 2015-01-03 NOTE — Telephone Encounter (Signed)
celexa 10 mg #30  1 po qd ---- ov 1 month

## 2015-01-06 ENCOUNTER — Encounter: Payer: Self-pay | Admitting: Family Medicine

## 2015-01-06 ENCOUNTER — Ambulatory Visit (INDEPENDENT_AMBULATORY_CARE_PROVIDER_SITE_OTHER): Payer: Medicare Other | Admitting: Family Medicine

## 2015-01-06 VITALS — BP 110/60 | HR 69 | Temp 98.0°F | Wt 134.0 lb

## 2015-01-06 DIAGNOSIS — G894 Chronic pain syndrome: Secondary | ICD-10-CM

## 2015-01-06 DIAGNOSIS — C50911 Malignant neoplasm of unspecified site of right female breast: Secondary | ICD-10-CM

## 2015-01-06 DIAGNOSIS — F32A Depression, unspecified: Secondary | ICD-10-CM

## 2015-01-06 DIAGNOSIS — F329 Major depressive disorder, single episode, unspecified: Secondary | ICD-10-CM | POA: Diagnosis not present

## 2015-01-06 MED ORDER — CITALOPRAM HYDROBROMIDE 40 MG PO TABS: 40.0000 mg | ORAL_TABLET | Freq: Every day | ORAL | Status: DC

## 2015-01-06 NOTE — Patient Instructions (Signed)
Insomnia Insomnia is frequent trouble falling and/or staying asleep. Insomnia can be a long term problem or a short term problem. Both are common. Insomnia can be a short term problem when the wakefulness is related to a certain stress or worry. Long term insomnia is often related to ongoing stress during waking hours and/or poor sleeping habits. Overtime, sleep deprivation itself can make the problem worse. Every little thing feels more severe because you are overtired and your ability to cope is decreased. CAUSES   Stress, anxiety, and depression.  Poor sleeping habits.  Distractions such as TV in the bedroom.  Naps close to bedtime.  Engaging in emotionally charged conversations before bed.  Technical reading before sleep.  Alcohol and other sedatives. They may make the problem worse. They can hurt normal sleep patterns and normal dream activity.  Stimulants such as caffeine for several hours prior to bedtime.  Pain syndromes and shortness of breath can cause insomnia.  Exercise late at night.  Changing time zones may cause sleeping problems (jet lag). It is sometimes helpful to have someone observe your sleeping patterns. They should look for periods of not breathing during the night (sleep apnea). They should also look to see how long those periods last. If you live alone or observers are uncertain, you can also be observed at a sleep clinic where your sleep patterns will be professionally monitored. Sleep apnea requires a checkup and treatment. Give your caregivers your medical history. Give your caregivers observations your family has made about your sleep.  SYMPTOMS   Not feeling rested in the morning.  Anxiety and restlessness at bedtime.  Difficulty falling and staying asleep. TREATMENT   Your caregiver may prescribe treatment for an underlying medical disorders. Your caregiver can give advice or help if you are using alcohol or other drugs for self-medication. Treatment  of underlying problems will usually eliminate insomnia problems.  Medications can be prescribed for short time use. They are generally not recommended for lengthy use.  Over-the-counter sleep medicines are not recommended for lengthy use. They can be habit forming.  You can promote easier sleeping by making lifestyle changes such as:  Using relaxation techniques that help with breathing and reduce muscle tension.  Exercising earlier in the day.  Changing your diet and the time of your last meal. No night time snacks.  Establish a regular time to go to bed.  Counseling can help with stressful problems and worry.  Soothing music and white noise may be helpful if there are background noises you cannot remove.  Stop tedious detailed work at least one hour before bedtime. HOME CARE INSTRUCTIONS   Keep a diary. Inform your caregiver about your progress. This includes any medication side effects. See your caregiver regularly. Take note of:  Times when you are asleep.  Times when you are awake during the night.  The quality of your sleep.  How you feel the next day. This information will help your caregiver care for you.  Get out of bed if you are still awake after 15 minutes. Read or do some quiet activity. Keep the lights down. Wait until you feel sleepy and go back to bed.  Keep regular sleeping and waking hours. Avoid naps.  Exercise regularly.  Avoid distractions at bedtime. Distractions include watching television or engaging in any intense or detailed activity like attempting to balance the household checkbook.  Develop a bedtime ritual. Keep a familiar routine of bathing, brushing your teeth, climbing into bed at the same   time each night, listening to soothing music. Routines increase the success of falling to sleep faster.  Use relaxation techniques. This can be using breathing and muscle tension release routines. It can also include visualizing peaceful scenes. You can  also help control troubling or intruding thoughts by keeping your mind occupied with boring or repetitive thoughts like the old concept of counting sheep. You can make it more creative like imagining planting one beautiful flower after another in your backyard garden.  During your day, work to eliminate stress. When this is not possible use some of the previous suggestions to help reduce the anxiety that accompanies stressful situations. MAKE SURE YOU:   Understand these instructions.  Will watch your condition.  Will get help right away if you are not doing well or get worse. Document Released: 09/10/2000 Document Revised: 12/06/2011 Document Reviewed: 10/11/2007 ExitCare Patient Information 2015 [employer]. This information is not intended to replace advice given to you by your health care provider. Make sure you discuss any questions you have with your health care provider.  

## 2015-01-06 NOTE — Assessment & Plan Note (Addendum)
celexa sent to pharmacy --pt has not yet started She used to take 40 mg  Slowly increase to 40 over next 3-4 weeks

## 2015-01-06 NOTE — Assessment & Plan Note (Signed)
Chronic pain ---meds per Dr Griffith Citron

## 2015-01-06 NOTE — Progress Notes (Signed)
Pre visit review using our clinic review tool, if applicable. No additional management support is needed unless otherwise documented below in the visit note. 

## 2015-01-06 NOTE — Progress Notes (Signed)
Patient ID: Angel [name], female    DOB: May 24, 1932  Age: 81 y.o. MRN: [id number]    Subjective:  Subjective HPI Angel [name] presents for f/u from hospital ---d/c 12/16/2014 Pt seeing cancer center for breast cancer.    Review of Systems  Constitutional: Positive for fatigue. Negative for activity change, appetite change and unexpected weight change.  Respiratory: Negative for cough and shortness of breath.   Cardiovascular: Negative for chest pain and palpitations.  Psychiatric/Behavioral: Negative for behavioral problems and dysphoric mood. The patient is not nervous/anxious.     History Past Medical History  Diagnosis Date  . Asthma   . Spinal stenosis   . Osteoporosis   . Chronic bronchitis   . DDD (degenerative disc disease)   . Osteoarthritis   . Degenerative joint disease   . Skin cancer of face     non melanoma  . Coronary artery disease Non-obstructive    a. 2012 Cath: LAD 40-30m->Med Rx;  b. 11/2013 Echo: EF 50-55%, nl wall motion;  c. 12/2013 Lexi MV: EF 74%, no ischemia.  . Depression   . Anxiety   . COPD (chronic obstructive pulmonary disease)   . Dyslipidemia   . Cholelithiasis   . GERD (gastroesophageal reflux disease)   . Hx of radiation therapy 07/02/1999- 08/11/1999    right supraclavucular/axillary region: 5040 cGy, 28 fractions  . Breast cancer 05/07/1999, 06/2011    a. s/p bilat mastectomies. recurrence 01/2014. Radiation, meds.  [name] Hx of radiation therapy 02/20/14- 03/27/14    right chest wall 4500 cGy 25 sessions  . Hypertension   . Kidney stones     one stones  . Cataract     eye surgery planned for 11/2014  . PVC's (premature ventricular contractions)   . Reflux esophagitis   . Pre-diabetes     She has past surgical history that includes Vesicovaginal fistula closure w/ TAH (age 6); Tubal ligation (1961); Appendectomy; Abdominal hysterectomy; Small intestine surgery (1998); Tonsillectomy; Breast lumpectomy (2002); Mastectomy (07/29/11);  Cardiac catheterization (03/2011, 3/15); Breast biopsy (Right, 01/21/2014); Vein ligation and stripping (Right); left heart catheterization with coronary angiogram (N/A, 12/05/2013); and left heart catheterization with coronary angiogram (N/A, 11/06/2014).   Her family history includes Asthma in her brother, brother, daughter, and grandchild; Cancer in her mother; Emphysema in her father; Heart disease in her brother and father; Lymphoma in her brother; Pancreatic cancer in her mother; Stroke in her daughter.She reports that she has never smoked. She has never used smokeless tobacco. She reports that she drinks alcohol. She reports that she does not use illicit drugs.  Current Outpatient Prescriptions on File Prior to Visit  Medication Sig Dispense Refill  . albuterol (PROVENTIL HFA;VENTOLIN HFA) 108 (90 BASE) MCG/ACT inhaler Inhale into the lungs every 6 (six) hours as needed for wheezing or shortness of breath.    [name] aspirin EC 81 MG tablet Take 1 tablet (81 mg total) by mouth daily.    . B Complex-Biotin-FA (B-COMPLEX PO) Take 1 tablet by mouth daily.     . Biotin 5000 MCG CAPS Take 5,000 mcg by mouth daily.    . budesonide-formoterol (SYMBICORT) 160-4.5 MCG/ACT inhaler Inhale 2 puffs into the lungs 2 (two) times daily as needed. (Patient taking differently: Inhale 2 puffs into the lungs 2 (two) times daily as needed (shortness of breath.). ) 3 Inhaler 1  . Calcium-Magnesium-Zinc 1000-400-15 MG TABS Take 1 tablet by mouth daily.      . celecoxib (CELEBREX) 200 MG capsule Take 1  capsule daily with food for pain & inflammation 90 capsule 3  . Cholecalciferol (VITAMIN D-3) 5000 UNITS TABS Take 1 tablet by mouth daily.     . diazepam (VALIUM) 5 MG tablet Take 0.5-1 tablets (2.5-5 mg total) by mouth every 8 (eight) hours as needed for anxiety. 45 tablet 0  . DULoxetine (CYMBALTA) 60 MG capsule Take 1 capsule by mouth  every day for mood 90 capsule 0  . ezetimibe (ZETIA) 10 MG tablet Take 10 mg by mouth  daily.    . Fish Oil OIL Take 1 capsule by mouth daily.     . Flaxseed, Linseed, (FLAX SEED OIL PO) Take 5 mLs by mouth daily. 1 tsp daily    . isosorbide mononitrate (IMDUR) 30 MG 24 hr tablet Take 1 tablet (30 mg total) by mouth daily. 30 tablet 3  . Misc Natural Products (GLUCOSAMINE CHOND COMPLEX/MSM PO) Take 1 tablet by mouth daily.     . montelukast (SINGULAIR) 10 MG tablet Take 1 tablet (10 mg total) by mouth at bedtime. 90 tablet 2  . Multiple Vitamin (MULTIVITAMIN) tablet Take 1 tablet by mouth daily.    . nitroGLYCERIN (NITROSTAT) 0.4 MG SL tablet Place 1 tablet (0.4 mg total) under the tongue every 5 (five) minutes x 3 doses as needed for chest pain. 25 tablet 3  . ondansetron (ZOFRAN ODT) 4 MG disintegrating tablet Take 1 tablet (4 mg total) by mouth every 8 (eight) hours as needed for nausea or vomiting. 20 tablet 0  . OxyCODONE (OXYCONTIN) 10 mg T12A 12 hr tablet Take 1 tablet (10 mg total) by mouth every 12 (twelve) hours. 60 tablet 0  . oxyCODONE-acetaminophen (PERCOCET) 10-325 MG per tablet Take 1 tablet by mouth every 4 (four) hours as needed for pain. 30 tablet 0   No current facility-administered medications on file prior to visit.     Objective:  Objective Physical Exam  Constitutional: She is oriented to person, place, and time. She appears well-developed and well-nourished. No distress.  HENT:  Right Ear: External ear normal.  Left Ear: External ear normal.  Nose: Nose normal.  Mouth/Throat: Oropharynx is clear and moist.  Eyes: EOM are normal. Pupils are equal, round, and reactive to light.  Neck: Normal range of motion. Neck supple.  Cardiovascular: Normal rate, regular rhythm and normal heart sounds.   No murmur heard. Pulmonary/Chest: Effort normal and breath sounds normal. No respiratory distress. She has no wheezes. She has no rales. She exhibits no tenderness.  Neurological: She is alert and oriented to person, place, and time.  Psychiatric: She has a  normal mood and affect. Her behavior is normal. Judgment and thought content normal.   BP 110/60 mmHg  Pulse 69  Temp(Src) 98 F (36.7 C) (Oral)  Wt 134 lb (60.782 kg)  SpO2 93% Wt Readings from Last 3 Encounters:  01/06/15 134 lb (60.782 kg)  12/16/14 136 lb 11.2 oz (62.007 kg)  12/06/14 133 lb 14.4 oz (60.737 kg)     Lab Results  Component Value Date   WBC 5.9 12/25/2014   HGB 12.5 12/25/2014   HCT 38.2 12/25/2014   PLT 245 12/25/2014   GLUCOSE 114 12/25/2014   CHOL 159 09/06/2014   TRIG 67 09/06/2014   HDL 60 09/06/2014   LDLCALC 86 09/06/2014   ALT 13 12/25/2014   AST 16 12/25/2014   NA 138 12/25/2014   K 4.3 12/25/2014   CL 104 12/15/2014   CREATININE 0.8 12/25/2014   BUN 13.3  12/25/2014   CO2 28 12/25/2014   TSH 3.234 09/06/2014   INR 1.05 12/13/2014   HGBA1C 5.4 09/06/2014   MICROALBUR 0.77 05/01/2014    Nm Pet Image Restag (ps) Skull Base To Thigh  12/24/2014   CLINICAL DATA:  Subsequent treatment strategy for restaging of breast cancer. Right-sided primary. Breast cancer in 2000, 2012 with right mastectomy. prophylactic left mastectomy. Chest wall recurrence 12/26/2013. Radiation therapy 5/27 through 03/27/2014.  EXAM: NUCLEAR MEDICINE PET SKULL BASE TO THIGH  TECHNIQUE: 6.8 mCi F-18 FDG was injected intravenously. Full-ring PET imaging was performed from the skull base to thigh after the radiotracer. CT data was obtained and used for attenuation correction and anatomic localization.  FASTING BLOOD GLUCOSE:  Value: 95 mg/dl  COMPARISON:  02/15/2014.  Chest CT of 12/13/2014.  FINDINGS: NECK  No areas of abnormal hypermetabolism.  CHEST  No residual chest wall soft tissue hypermetabolism. No thoracic nodal or pulmonary hypermetabolism.  ABDOMEN/PELVIS  No areas of abnormal hypermetabolism.  SKELETON  Hypermetabolism involves the anterior fourth through sixth right ribs. Suspect a nondisplaced fracture of the fifth anterior right rib on image 83 of series 4. The most  hypermetabolic focus, the anterior fifth right rib, measures a S.U.V. max of 5.6. The adjacent soft tissue fullness within the right chest wall is improved, including at 6 mm on image 88 of series 4.  Hypermetabolism about the left iliac bone, just above the acetabulum. Likely degenerative and/or due to muscular strain.  CT IMAGES PERFORMED FOR ATTENUATION CORRECTION  No cervical adenopathy.  Chest findings deferred to recent diagnostic CT. Multivessel coronary artery atherosclerosis. Bilateral mastectomy and right axillary node dissection. Right upper lobe radiation fibrosis.  Bilateral nephrolithiasis.  Hysterectomy.  IMPRESSION: 1. Resolution of previously described chest wall hypermetabolism, without hypermetabolic recurrent or metastatic disease. 2. Hypermetabolism within anterior right fourth through sixth ribs. Likely due to nondisplaced fractures, possibly superimposed upon a component of radiation necrosis. The soft tissue fullness about the adjacent anterior right chest wall described on 12/13/2014 is decreased, and was likely due to overlying hematoma.   Electronically Signed   By: Abigail Miyamoto M.D.   On: 12/24/2014 10:53     Assessment & Plan:  Plan I have discontinued Angel [name]'s citalopram. I am also having her start on citalopram. Additionally, I am having her maintain her Calcium-Magnesium-Zinc, Vitamin D-3, (Flaxseed, Linseed, (FLAX SEED OIL PO)), ezetimibe, Fish Oil, albuterol, nitroGLYCERIN, DULoxetine, montelukast, aspirin EC, budesonide-formoterol, Biotin, B Complex-Biotin-FA (B-COMPLEX PO), Misc Natural Products (GLUCOSAMINE CHOND COMPLEX/MSM PO), multivitamin, isosorbide mononitrate, ondansetron, diazepam, celecoxib, OxyCODONE, and oxyCODONE-acetaminophen.  Meds ordered this encounter  Medications  . citalopram (CELEXA) 40 MG tablet    Sig: Take 1 tablet (40 mg total) by mouth daily.    Dispense:  90 tablet    Refill:  3    Problem List Items Addressed This Visit    Breast  cancer, right breast    Chronic pain ---meds per Dr Griffith Citron        Depression - Primary    celexa sent to pharmacy --pt has not yet started rto 1 month or sooner prn      Relevant Medications   citalopram (CELEXA) tablet    Other Visit Diagnoses    Chronic pain syndrome           Follow-up: Return in about 4 months (around 05/08/2015), or if symptoms worsen or fail to improve.  [name], DO

## 2015-01-08 ENCOUNTER — Ambulatory Visit (HOSPITAL_BASED_OUTPATIENT_CLINIC_OR_DEPARTMENT_OTHER): Payer: Medicare Other

## 2015-01-08 VITALS — BP 120/62 | HR 69 | Temp 97.8°F

## 2015-01-08 DIAGNOSIS — C44501 Unspecified malignant neoplasm of skin of breast: Secondary | ICD-10-CM | POA: Diagnosis not present

## 2015-01-08 DIAGNOSIS — Z853 Personal history of malignant neoplasm of breast: Secondary | ICD-10-CM

## 2015-01-08 DIAGNOSIS — Z170 Estrogen receptor positive status [ER+]: Secondary | ICD-10-CM | POA: Diagnosis not present

## 2015-01-08 DIAGNOSIS — D0512 Intraductal carcinoma in situ of left breast: Secondary | ICD-10-CM | POA: Diagnosis not present

## 2015-01-08 DIAGNOSIS — Z5111 Encounter for antineoplastic chemotherapy: Secondary | ICD-10-CM

## 2015-01-08 DIAGNOSIS — C50911 Malignant neoplasm of unspecified site of right female breast: Secondary | ICD-10-CM

## 2015-01-08 DIAGNOSIS — C50811 Malignant neoplasm of overlapping sites of right female breast: Secondary | ICD-10-CM

## 2015-01-08 MED ORDER — FULVESTRANT 250 MG/5ML IM SOLN
500.0000 mg | INTRAMUSCULAR | Status: DC
Start: 2015-01-08 — End: 2015-01-08
  Administered 2015-01-08: 500 mg via INTRAMUSCULAR
  Filled 2015-01-08: qty 10

## 2015-01-08 NOTE — Patient Instructions (Signed)
Fulvestrant injection  What is this medicine?  FULVESTRANT (ful VES trant) blocks the effects of estrogen. It is used to treat breast cancer in women past the age of menopause.  This medicine may be used for other purposes; ask your health care provider or pharmacist if you have questions.  COMMON BRAND NAME(S): FASLODEX  What should I tell my health care provider before I take this medicine?  They need to know if you have any of these conditions:  -bleeding problems  -liver disease  -low levels of platelets in the blood  -an unusual or allergic reaction to fulvestrant, other medicines, foods, dyes, or preservatives  -pregnant or trying to get pregnant  -breast-feeding  How should I use this medicine?  This medicine is for injection into a muscle. It is usually given by a health care professional in a hospital or clinic setting.  Talk to your pediatrician regarding the use of this medicine in children. Special care may be needed.  Overdosage: If you think you have taken too much of this medicine contact a poison control center or emergency room at once.  NOTE: This medicine is only for you. Do not share this medicine with others.  What if I miss a dose?  It is important not to miss your dose. Call your doctor or health care professional if you are unable to keep an appointment.  What may interact with this medicine?  -medicines that treat or prevent blood clots like warfarin, enoxaparin, and dalteparin  This list may not describe all possible interactions. Give your health care provider a list of all the medicines, herbs, non-prescription drugs, or dietary supplements you use. Also tell them if you smoke, drink alcohol, or use illegal drugs. Some items may interact with your medicine.  What should I watch for while using this medicine?  Your condition will be monitored carefully while you are receiving this medicine. You will need important blood work done while you are taking this medicine.  Do not become pregnant  while taking this medicine. Women should inform their doctor if they wish to become pregnant or think they might be pregnant. There is a potential for serious side effects to an unborn child. Talk to your health care professional or pharmacist for more information.  What side effects may I notice from receiving this medicine?  Side effects that you should report to your doctor or health care professional as soon as possible:  -allergic reactions like skin rash, itching or hives, swelling of the face, lips, or tongue  -feeling faint or lightheaded, falls  -fever or flu-like symptoms  -sore throat  -vaginal bleeding  Side effects that usually do not require medical attention (report to your doctor or health care professional if they continue or are bothersome):  -aches, pains  -constipation or diarrhea  -headache  -hot flashes  -nausea, vomiting  -pain at site where injected  -stomach pain  This list may not describe all possible side effects. Call your doctor for medical advice about side effects. You may report side effects to FDA at 1-800-FDA-1088.  Where should I keep my medicine?  This drug is given in a hospital or clinic and will not be stored at home.  NOTE: This sheet is a summary. It may not cover all possible information. If you have questions about this medicine, talk to your doctor, pharmacist, or health care provider.   2015, Elsevier/Gold Standard. (2008-01-22 15:39:24)

## 2015-01-09 NOTE — Progress Notes (Signed)
Late entry for missed G-code. Based on review of the evaluation and goals by Candie Mile, PT.   01/03/15 1425  PT G-Codes **NOT FOR INPATIENT CLASS**  Functional Assessment Tool Used Clinical judgement based on review of medical record  Functional Limitation Mobility: Walking and moving around  Mobility: Walking and Moving Around Current Status ([id number]) CI  Mobility: Walking and Moving Around Goal Status [phone]) CI  Mobility: Walking and Moving Around Discharge Status [phone]) CI  Angel [name], [place]  [phone] 01/09/2015

## 2015-01-10 ENCOUNTER — Other Ambulatory Visit: Payer: Self-pay | Admitting: Nurse Practitioner

## 2015-01-13 ENCOUNTER — Telehealth: Payer: Self-pay | Admitting: *Deleted

## 2015-01-13 ENCOUNTER — Encounter: Payer: Self-pay | Admitting: Oncology

## 2015-01-13 ENCOUNTER — Telehealth: Payer: Self-pay | Admitting: Family Medicine

## 2015-01-13 ENCOUNTER — Encounter: Payer: Self-pay | Admitting: Internal Medicine

## 2015-01-13 ENCOUNTER — Other Ambulatory Visit: Payer: Self-pay | Admitting: *Deleted

## 2015-01-13 MED ORDER — OXYCODONE-ACETAMINOPHEN 10-325 MG PO TABS: 1.0000 | ORAL_TABLET | ORAL | Status: DC | PRN

## 2015-01-13 NOTE — Progress Notes (Signed)
I faxed prior auth req for oxycodene to [facility]

## 2015-01-13 NOTE — Telephone Encounter (Signed)
Caller name: Barnabas Lister from [facility] Physical Therapist  Call back number: [phone]   Reason for call:  Pt would like to stop therapy due to her receiving cancer treatment. Physical therapist advised pt is doing well in agreement. Please advise

## 2015-01-13 NOTE — Telephone Encounter (Signed)
[facility] WILL SEND OVER THE PRIOR AUTHORIZATION. PT. ALSO NEEDS A REFILL ON HER PERCOCET. THIS NOTE WAS ROUTED TO DR.MAGRINAT AND VAL DODD,RN.

## 2015-01-13 NOTE — Telephone Encounter (Signed)
ok 

## 2015-01-13 NOTE — Telephone Encounter (Signed)
Angel [name] has been made aware of Dr.Lowne's approval to discontinue.     KP

## 2015-01-13 NOTE — Progress Notes (Signed)
Applied for copay assistance and was approved w/ [employer]. She is approved for $5000 for her chemo drugs and for office visits if it's grouped w/ treatment for 12 months from 12/30/14 and will go back 6 months from date of letter. $7034 is a guaranteed award and the remaining award balance of $3350 is accessible on a [employer] basis. I forwarded copy of letter and proof of expenditure form to Rema Jasmine in the billing dept.

## 2015-01-13 NOTE — Telephone Encounter (Signed)
To MD.    KP 

## 2015-01-14 ENCOUNTER — Other Ambulatory Visit: Payer: Self-pay | Admitting: *Deleted

## 2015-01-14 ENCOUNTER — Encounter: Payer: Self-pay | Admitting: Oncology

## 2015-01-14 MED ORDER — OXYMORPHONE HCL ER 5 MG PO TB12: 5.0000 mg | ORAL_TABLET | Freq: Two times a day (BID) | ORAL | Status: DC

## 2015-01-14 NOTE — Progress Notes (Signed)
Per [facility] oxcontin is denied, because the patient will have to try embeda or opana ER 1st before they will auth. I will let dr. Jana Hakim know.

## 2015-01-14 NOTE — Telephone Encounter (Signed)
This RN spoke with pt per prescription for oxycontin not covered by her insurance.  Angel [name] states she " has pain every day that interferes with activities and wakes me up at night "  Pt is currently using percocet 10/325mg  up to 3 tablets a day.  Per managed care - Opana is covered for long acting pain medication.  This note with above request for Opana will be given to MD.

## 2015-01-15 ENCOUNTER — Ambulatory Visit: Payer: Medicare Other | Admitting: Nurse Practitioner

## 2015-01-15 ENCOUNTER — Other Ambulatory Visit (HOSPITAL_BASED_OUTPATIENT_CLINIC_OR_DEPARTMENT_OTHER): Payer: Medicare Other

## 2015-01-15 ENCOUNTER — Other Ambulatory Visit: Payer: Medicare Other

## 2015-01-15 DIAGNOSIS — M810 Age-related osteoporosis without current pathological fracture: Secondary | ICD-10-CM

## 2015-01-15 DIAGNOSIS — C44501 Unspecified malignant neoplasm of skin of breast: Secondary | ICD-10-CM | POA: Diagnosis not present

## 2015-01-15 DIAGNOSIS — G8929 Other chronic pain: Secondary | ICD-10-CM

## 2015-01-15 DIAGNOSIS — C50911 Malignant neoplasm of unspecified site of right female breast: Secondary | ICD-10-CM

## 2015-01-15 DIAGNOSIS — D0512 Intraductal carcinoma in situ of left breast: Secondary | ICD-10-CM

## 2015-01-15 LAB — COMPREHENSIVE METABOLIC PANEL (CC13)
ALT: 10 U/L (ref 0–55)
ANION GAP: 11 meq/L (ref 3–11)
AST: 16 U/L (ref 5–34)
Albumin: 3.6 g/dL (ref 3.5–5.0)
Alkaline Phosphatase: 49 U/L (ref 40–150)
BILIRUBIN TOTAL: 0.27 mg/dL (ref 0.20–1.20)
BUN: 15.5 mg/dL (ref 7.0–26.0)
CO2: 25 mEq/L (ref 22–29)
CREATININE: 0.7 mg/dL (ref 0.6–1.1)
Calcium: 9.1 mg/dL (ref 8.4–10.4)
Chloride: 102 mEq/L (ref 98–109)
EGFR: 80 mL/min/{1.73_m2} — ABNORMAL LOW (ref >90)
Glucose: 92 mg/dl (ref 70–140)
Potassium: 4.4 mEq/L (ref 3.5–5.1)
Sodium: 139 mEq/L (ref 136–145)
Total Protein: 6.2 g/dL — ABNORMAL LOW (ref 6.4–8.3)

## 2015-01-15 LAB — CBC WITH DIFFERENTIAL/PLATELET
BASO%: 0.8 % (ref 0.0–2.0)
BASOS ABS: 0 10*3/uL (ref 0.0–0.1)
EOS ABS: 0.1 10*3/uL (ref 0.0–0.5)
EOS%: 1.5 % (ref 0.0–7.0)
HCT: 38.6 % (ref 34.8–46.6)
HEMOGLOBIN: 12.6 g/dL (ref 11.6–15.9)
LYMPH#: 1.8 10*3/uL (ref 0.9–3.3)
LYMPH%: 37.5 % (ref 14.0–49.7)
MCH: 29.8 pg (ref 25.1–34.0)
MCHC: 32.6 g/dL (ref 31.5–36.0)
MCV: 91.6 fL (ref 79.5–101.0)
MONO#: 0.4 10*3/uL (ref 0.1–0.9)
MONO%: 8.5 % (ref 0.0–14.0)
NEUT%: 51.7 % (ref 38.4–76.8)
NEUTROS ABS: 2.5 10*3/uL (ref 1.5–6.5)
Platelets: 188 10*3/uL (ref 145–400)
RBC: 4.22 10*6/uL (ref 3.70–5.45)
RDW: 12 % (ref 11.2–14.5)
WBC: 4.9 10*3/uL (ref 3.9–10.3)

## 2015-01-16 ENCOUNTER — Telehealth: Payer: Self-pay

## 2015-01-16 ENCOUNTER — Other Ambulatory Visit: Payer: Self-pay | Admitting: Nurse Practitioner

## 2015-01-16 ENCOUNTER — Telehealth: Payer: Self-pay | Admitting: Family Medicine

## 2015-01-16 ENCOUNTER — Telehealth: Payer: Self-pay | Admitting: *Deleted

## 2015-01-16 DIAGNOSIS — G8929 Other chronic pain: Secondary | ICD-10-CM

## 2015-01-16 DIAGNOSIS — C50911 Malignant neoplasm of unspecified site of right female breast: Secondary | ICD-10-CM

## 2015-01-16 MED ORDER — DIAZEPAM 5 MG PO TABS: 2.5000 mg | ORAL_TABLET | Freq: Three times a day (TID) | ORAL | Status: DC | PRN

## 2015-01-16 NOTE — Telephone Encounter (Signed)
Pt called to ask where to get her valium refilled. Told her that she needs to see her PCP for that. She switched from Dr [name] to Dr Etter Sjogren. She is agreeable to call Dr Etter Sjogren. She asked about percocet and a rx was generated 4/18 and I told her it looks like it is read for pickup. She will come by tomorrow.

## 2015-01-16 NOTE — Telephone Encounter (Signed)
Caller name: gretel Relation to pt: self Call back number: [phone] Pharmacy: [facility] on [address]  Reason for call:   Requesting valium refill

## 2015-01-16 NOTE — Telephone Encounter (Signed)
Received refill request from pt's pharmacy for Valium refill.  [name], NP notified.  Called pt back and informed pt re: Per [name], NP,  Valium was refilled x 1 by Retta Mac, NP symptom management as a one time courtesy refill.  Pt needs to contact her PCP for further refill of Valium.  Pt voiced understanding.

## 2015-01-16 NOTE — Telephone Encounter (Signed)
Rx faxed.    KP 

## 2015-01-16 NOTE — Telephone Encounter (Signed)
Last seen 01/06/15 and filled 12/10/14 #45 by another provider    Please advise     KP

## 2015-01-16 NOTE — Telephone Encounter (Signed)
Refill x1   1 refill 

## 2015-01-17 ENCOUNTER — Other Ambulatory Visit: Payer: Self-pay

## 2015-01-17 ENCOUNTER — Other Ambulatory Visit: Payer: Self-pay | Admitting: *Deleted

## 2015-01-17 DIAGNOSIS — F32A Depression, unspecified: Secondary | ICD-10-CM

## 2015-01-17 DIAGNOSIS — F329 Major depressive disorder, single episode, unspecified: Secondary | ICD-10-CM

## 2015-01-17 MED ORDER — OXYCODONE-ACETAMINOPHEN 10-325 MG PO TABS: 1.0000 | ORAL_TABLET | ORAL | Status: DC | PRN

## 2015-01-17 MED ORDER — CITALOPRAM HYDROBROMIDE 40 MG PO TABS: 40.0000 mg | ORAL_TABLET | Freq: Every day | ORAL | Status: DC

## 2015-01-22 ENCOUNTER — Ambulatory Visit (HOSPITAL_BASED_OUTPATIENT_CLINIC_OR_DEPARTMENT_OTHER): Payer: Medicare Other

## 2015-01-22 ENCOUNTER — Ambulatory Visit (HOSPITAL_BASED_OUTPATIENT_CLINIC_OR_DEPARTMENT_OTHER): Payer: Medicare Other | Admitting: Nurse Practitioner

## 2015-01-22 ENCOUNTER — Other Ambulatory Visit: Payer: Self-pay | Admitting: Oncology

## 2015-01-22 ENCOUNTER — Telehealth: Payer: Self-pay | Admitting: Oncology

## 2015-01-22 ENCOUNTER — Encounter: Payer: Self-pay | Admitting: Nurse Practitioner

## 2015-01-22 VITALS — BP 141/60 | HR 76 | Temp 97.5°F | Resp 18 | Ht 62.0 in | Wt 134.9 lb

## 2015-01-22 DIAGNOSIS — D0512 Intraductal carcinoma in situ of left breast: Secondary | ICD-10-CM | POA: Diagnosis not present

## 2015-01-22 DIAGNOSIS — Z5111 Encounter for antineoplastic chemotherapy: Secondary | ICD-10-CM

## 2015-01-22 DIAGNOSIS — C792 Secondary malignant neoplasm of skin: Secondary | ICD-10-CM

## 2015-01-22 DIAGNOSIS — Z170 Estrogen receptor positive status [ER+]: Secondary | ICD-10-CM

## 2015-01-22 DIAGNOSIS — C50911 Malignant neoplasm of unspecified site of right female breast: Secondary | ICD-10-CM | POA: Diagnosis not present

## 2015-01-22 DIAGNOSIS — G893 Neoplasm related pain (acute) (chronic): Secondary | ICD-10-CM

## 2015-01-22 MED ORDER — FULVESTRANT 250 MG/5ML IM SOLN
500.0000 mg | Freq: Once | INTRAMUSCULAR | Status: AC
  Administered 2015-01-22: 500 mg via INTRAMUSCULAR
  Filled 2015-01-22: qty 10

## 2015-01-22 NOTE — Telephone Encounter (Signed)
Gave patient avs report and appointments for May thru August.  °

## 2015-01-22 NOTE — Progress Notes (Signed)
Angel [name]  [phone] [phone]     ID: Angel [name] DOB: 07-14-1932  MR#: [id number]  KYH#:[id number]  Patient Care Team: Angel Chessman, DO as PCP - General (Family Medicine) Angel Cruel, MD as Consulting Physician (Oncology) Angel Koh, MD as Consulting Physician (Radiation Oncology) Angel Margarita, MD as Consulting Physician (Cardiology) OTHER M.D.: Angel [name] (ophth)  CHIEF COMPLAINT: Chest wall recurrence of breast cancer CURRENT TREATMENT: Letrozole  BREAST CANCER HISTORY: Per Dr. 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 She had a second right breast cancer in 2012, with right mastectomy by Dr Angel [name] 07-29-2011 for T1N0M0 ER/PR positive, HER-2 negative invasive ductal carcinoma; she also had what was planned as prophylactic left mastectomy, with unexpected finding of left DCIS in that pathology. Plan in 09-2011 was to begin tamoxifen, chosen particularly due to low bone density. Patient did not keep follow up visits at this office, stopped tamoxifen due intolerance (tho she does not remember what problem she had with this) and was treated with raloxifene by Dr Angel [name].  She was seen next at this office on 12-26-2013 with an enlarging nodular area above right mastectomy scar,reportedly present for several months. Excisional biopsy done 01-21-14 found grade 2 invasive ductal carcinoma with involvement at Angel [name]) and lymphovascular space invasion, ER positive 100%, PR positive 75%, proliferation marker 80% and HER-2 negative by [facility] (pathology [phone]). PET 02-15-14 did not identify any involvement beyond right chest wall. She had 4500 cGy as electron beam radiation from 5-27 thru 03-27-14 to right chest wall. By 04-30-14 there were multiple tiny nodules in region of right mastectomy scar."  The patient's  subsequent history is as detailed below  INTERVAL HISTORY: Angel [name] returns today for follow up of her recurrent breast cancer, accompanied by her daughter. She was hospitalized in March for increasing right chest wall pain and pleurisy. A CT angio chest showed showed soft tissue thickening along the lower right chest wall consistent with progression. She was started on fulvestrant in the [name] and is here for a booster injection today.  REVIEW OF SYSTEMS: Angel [name] has noticed no side effects from the fulvestrant. Her hot flashes are no worse and she does not particularly complain of injection site pain. She continues to have chest wall and shoulder pain managed with percocet and oxymorphone. She is in constant pain despite these efforts, but it is certainly less than before. Surprisingly she is not constipated with these medicines. She denies fevers, chills ,nausea, or vomiting. She denies shortness of breath, chest pain, cough, or palpitations. A detailed review of systems is otherwise stable.   PAST MEDICAL HISTORY: Past Medical History  Diagnosis Date  . Asthma   . Spinal stenosis   . Osteoporosis   . Chronic bronchitis   . DDD (degenerative disc disease)   . Osteoarthritis   . Degenerative joint disease   . Skin cancer of face     non melanoma  . Coronary artery disease Non-obstructive    a. 2012 Cath: LAD 40-43m>Med Rx;  b. 11/2013 Echo: EF 50-55%, nl wall motion;  c. 12/2013 Lexi MV: EF 74%, no ischemia.  . Depression   . Anxiety   . COPD (chronic obstructive pulmonary disease)   . Dyslipidemia   . Cholelithiasis   . GERD (gastroesophageal reflux disease)   . Hx of radiation therapy 07/02/1999- 08/11/1999    right supraclavucular/axillary region:  5040 cGy, 28 fractions  . Breast cancer 05/07/1999, 06/2011    a. s/p bilat mastectomies. recurrence 01/2014. Radiation, meds.  [name] Hx of radiation therapy 02/20/14- 03/27/14    right chest wall 4500 cGy 25 sessions  . Hypertension   .  Kidney stones     one stones  . Cataract     eye surgery planned for 11/2014  . PVC's (premature ventricular contractions)   . Reflux esophagitis   . Pre-diabetes     PAST SURGICAL HISTORY: Past Surgical History  Procedure Laterality Date  . Vesicovaginal fistula closure w/ tah  age 57  . Tubal ligation  1961  . Appendectomy    . Abdominal hysterectomy      in her 30's  . Small intestine surgery  1998    [facility]  . Tonsillectomy    . Breast lumpectomy  2002    right breast  . Mastectomy  07/29/11    bilateral-rt nodes-none on left  . Cardiac catheterization  03/2011, 3/15  . Breast biopsy Right 01/21/2014    Procedure: BREAST/CHEST WALL BIOPSY;  Surgeon: Angel Ok, MD;  Location: [name];  Service: General;  Laterality: Right;  . Vein ligation and stripping Right   . Left heart catheterization with coronary angiogram N/A 12/05/2013    Procedure: LEFT HEART CATHETERIZATION WITH CORONARY ANGIOGRAM;  Surgeon: Angel Blanks, MD;  Location: [name];  Service: Cardiovascular;  Laterality: N/A;  . Left heart catheterization with coronary angiogram N/A 11/06/2014    Procedure: LEFT HEART CATHETERIZATION WITH CORONARY ANGIOGRAM;  Surgeon: Angel Grooms, MD;  Location: Angel [name];  Service: Cardiovascular;  Laterality: N/A;    FAMILY HISTORY Family History  Problem Relation Age of Onset  . Pancreatic cancer Mother   . Cancer Mother     pancreatic  . Asthma Brother   . Asthma Daughter   . Stroke Daughter   . Asthma Grandchild   . Asthma Brother     deceased  . Heart disease Father   . Emphysema Father   . Heart disease Brother   . Lymphoma Brother    the patient's father died at the age of 93 from heart disease. He was a smoker. The patient's mother died at the age of 62 with cancer of the pancreas. The patient has 3 brothers, no sisters. There is no history of breast or ovarian cancer in the family to her knowledge.  GYNECOLOGIC HISTORY:  No LMP recorded.  Patient has had a hysterectomy. Menarche age 43, first live birth age 84. She is GX P4. She stopped having periods in her 3s. She took hormone replacement for approximately 10 years. 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 She has been "single" for 40 years. Her oldest daughter died from a subarachnoid hemorrhage at age 61, 2 years ago. This is when the patient had undergone treatment for invasive breast cancer and she tells me she was simply overwhelmed and could not start the planned antiestrogen therapy. The next daughter, [name], is a retired Software engineer. She lives in [place]. Next child, Freda Munro, works in Charity fundraiser. She also lives in [place]. The youngest, [name], is retired from the Office manager position. He also owned his own business. The patient has 7 grandchildren and 2 many great-grandchildren to count. She attends the [employer]    ADVANCED DIRECTIVES: In place; the patient's daughter Freda Munro is  her healthcare power of attorney. She can be reached at [place]: History  Substance Use Topics  . Smoking status: Never Smoker   . Smokeless tobacco: Never Used  . Alcohol Use: Yes     Comment: occ wine     Colonoscopy:  PAP:  Bone density:  Lipid panel:  Allergies  Allergen Reactions  . Penicillins Rash  . Adhesive [Tape] Other (See Comments)    Pt prefers to use paper tape    Current Outpatient Prescriptions  Medication Sig Dispense Refill  . aspirin EC 81 MG tablet Take 1 tablet (81 mg total) by mouth daily.    . B Complex-Biotin-FA (B-COMPLEX PO) Take 1 tablet by mouth daily.     . Biotin 5000 MCG CAPS Take 5,000 mcg by mouth daily.    . Calcium-Magnesium-Zinc 1000-400-15 MG TABS Take 1 tablet by mouth daily.      . celecoxib (CELEBREX) 200 MG capsule Take 1 capsule daily with food for pain & inflammation 90  capsule 3  . Cholecalciferol (VITAMIN D-3) 5000 UNITS TABS Take 1 tablet by mouth daily.     . citalopram (CELEXA) 40 MG tablet Take 1 tablet (40 mg total) by mouth daily. (Patient taking differently: Take 40 mg by mouth daily. Pt is taking 20 mg right now but slowly increasing to 40 mg) 90 tablet 3  . diazepam (VALIUM) 5 MG tablet Take 0.5-1 tablets (2.5-5 mg total) by mouth every 8 (eight) hours as needed for anxiety. 45 tablet 1  . DULoxetine (CYMBALTA) 60 MG capsule Take 1 capsule by mouth  every day for mood 90 capsule 0  . ezetimibe (ZETIA) 10 MG tablet Take 10 mg by mouth daily.    . Fish Oil OIL Take 1 capsule by mouth daily.     . Flaxseed, Linseed, (GROUND FLAX SEEDS PO) Take 5 mLs by mouth.    . isosorbide mononitrate (IMDUR) 30 MG 24 hr tablet Take 1 tablet (30 mg total) by mouth daily. 30 tablet 3  . Misc Natural Products (GLUCOSAMINE CHOND COMPLEX/MSM PO) Take 1 tablet by mouth daily.     . montelukast (SINGULAIR) 10 MG tablet Take 1 tablet (10 mg total) by mouth at bedtime. 90 tablet 2  . Multiple Vitamin (MULTIVITAMIN) tablet Take 1 tablet by mouth daily.    [name] oxyCODONE-acetaminophen (PERCOCET) 10-325 MG per tablet Take 1 tablet by mouth every 4 (four) hours as needed for pain. 60 tablet 0  . oxymorphone (OPANA ER) 5 MG 12 hr tablet Take 1 tablet (5 mg total) by mouth every 12 (twelve) hours. 60 tablet 0  . albuterol (PROVENTIL HFA;VENTOLIN HFA) 108 (90 BASE) MCG/ACT inhaler Inhale into the lungs every 6 (six) hours as needed for wheezing or shortness of breath.    . budesonide-formoterol (SYMBICORT) 160-4.5 MCG/ACT inhaler Inhale 2 puffs into the lungs 2 (two) times daily as needed. (Patient not taking: Reported on 01/22/2015) 3 Inhaler 1  . nitroGLYCERIN (NITROSTAT) 0.4 MG SL tablet Place 1 tablet (0.4 mg total) under the tongue every 5 (five) minutes x 3 doses as needed for chest pain. (Patient not taking: Reported on 01/22/2015) 25 tablet 3  . ondansetron (ZOFRAN ODT) 4 MG  disintegrating tablet Take 1 tablet (4 mg total) by mouth every 8 (eight) hours as needed for nausea or vomiting. (Patient not taking: Reported on 01/22/2015) 20 tablet 0   No current facility-administered medications for this visit.    OBJECTIVE: Older white woman in no acute  distress Filed Vitals:   01/22/15 1122  BP: 141/60  Pulse: 76  Temp: 97.5 F (36.4 C)  Resp: 18     Body mass index is 24.67 kg/(m^2).    ECOG FS:1 - Symptomatic but completely ambulatory  Sclerae unicteric, right pupil round and reactive; left eye is blind Oropharynx clear and moist-- no thrush or other lesions noted  No cervical or supraclavicular adenopathy Lungs no rales or rhonchi Heart regular rate and rhythm Abd soft, nontender, positive bowel sounds MSK no focal spinal tenderness, no upper extremity lymphedema Neuro: nonfocal, well oriented, appropriate affect Breasts: status post bilateral mastectomies. No evidence of chest wall recurrence. Both axillae are benign   [name] RESULTS:  CMP     Component Value Date/Time   NA 139 01/15/2015 1317   NA 138 12/15/2014 0430   K 4.4 01/15/2015 1317   K 4.0 12/15/2014 0430   CL 104 12/15/2014 0430   CO2 25 01/15/2015 1317   CO2 30 12/15/2014 0430   GLUCOSE 92 01/15/2015 1317   GLUCOSE 93 12/15/2014 0430   BUN 15.5 01/15/2015 1317   BUN 12 12/15/2014 0430   CREATININE 0.7 01/15/2015 1317   CREATININE 0.67 12/15/2014 0430   CREATININE 0.78 05/01/2014 1050   CALCIUM 9.1 01/15/2015 1317   CALCIUM 8.9 12/15/2014 0430   PROT 6.2* 01/15/2015 1317   PROT 6.8 12/13/2014 1525   ALBUMIN 3.6 01/15/2015 1317   ALBUMIN 3.9 12/13/2014 1525   AST 16 01/15/2015 1317   AST 23 12/13/2014 1525   ALT 10 01/15/2015 1317   ALT 14 12/13/2014 1525   ALKPHOS 49 01/15/2015 1317   ALKPHOS 48 12/13/2014 1525   BILITOT 0.27 01/15/2015 1317   BILITOT 0.4 12/13/2014 1525   GFRNONAA 81* 12/15/2014 0430   GFRNONAA 73 05/01/2014 1050   GFRAA >90 12/15/2014 0430   GFRAA 84  05/01/2014 1050    I No results found for: SPEP  [name] Results  Component Value Date   WBC 4.9 01/15/2015   NEUTROABS 2.5 01/15/2015   HGB 12.6 01/15/2015   HCT 38.6 01/15/2015   MCV 91.6 01/15/2015   PLT 188 01/15/2015      Chemistry      Component Value Date/Time   NA 139 01/15/2015 1317   NA 138 12/15/2014 0430   K 4.4 01/15/2015 1317   K 4.0 12/15/2014 0430   CL 104 12/15/2014 0430   CO2 25 01/15/2015 1317   CO2 30 12/15/2014 0430   BUN 15.5 01/15/2015 1317   BUN 12 12/15/2014 0430   CREATININE 0.7 01/15/2015 1317   CREATININE 0.67 12/15/2014 0430   CREATININE 0.78 05/01/2014 1050      Component Value Date/Time   CALCIUM 9.1 01/15/2015 1317   CALCIUM 8.9 12/15/2014 0430   ALKPHOS 49 01/15/2015 1317   ALKPHOS 48 12/13/2014 1525   AST 16 01/15/2015 1317   AST 23 12/13/2014 1525   ALT 10 01/15/2015 1317   ALT 14 12/13/2014 1525   BILITOT 0.27 01/15/2015 1317   BILITOT 0.4 12/13/2014 1525       [name] Results  Component Value Date   LABCA2 20 01/31/2014    No components found for: SWHQP591  No results for input(s): INR in the last 168 hours.  Urinalysis    Component Value Date/Time   COLORURINE YELLOW 12/06/2014 1355   APPEARANCEUR CLEAR 12/06/2014 1355   LABSPEC 1.003* 12/06/2014 1355   PHURINE 6.0 12/06/2014 1355   GLUCOSEU NEGATIVE 12/06/2014 1355   HGBUR NEGATIVE  12/06/2014 [place] 12/06/2014 1355   KETONESUR NEGATIVE 12/06/2014 1355   PROTEINUR NEGATIVE 12/06/2014 1355   UROBILINOGEN 0.2 12/06/2014 1355   NITRITE NEGATIVE 12/06/2014 1355   LEUKOCYTESUR NEGATIVE 12/06/2014 1355    STUDIES: Nm Pet Image Restag (ps) Skull Base To Thigh  12/24/2014   CLINICAL DATA:  Subsequent treatment strategy for restaging of breast cancer. Right-sided primary. Breast cancer in 2000, 2012 with right mastectomy. prophylactic left mastectomy. Chest wall recurrence 12/26/2013. Radiation therapy 5/27 through 03/27/2014.  EXAM: NUCLEAR MEDICINE  PET SKULL BASE TO THIGH  TECHNIQUE: 6.8 mCi F-18 FDG was injected intravenously. Full-ring PET imaging was performed from the skull base to thigh after the radiotracer. CT data was obtained and used for attenuation correction and anatomic localization.  FASTING BLOOD GLUCOSE:  Value: 95 mg/dl  COMPARISON:  02/15/2014.  Chest CT of 12/13/2014.  FINDINGS: NECK  No areas of abnormal hypermetabolism.  CHEST  No residual chest wall soft tissue hypermetabolism. No thoracic nodal or pulmonary hypermetabolism.  ABDOMEN/PELVIS  No areas of abnormal hypermetabolism.  SKELETON  Hypermetabolism involves the anterior fourth through sixth right ribs. Suspect a nondisplaced fracture of the fifth anterior right rib on image 83 of series 4. The most hypermetabolic focus, the anterior fifth right rib, measures a S.U.V. max of 5.6. The adjacent soft tissue fullness within the right chest wall is improved, including at 6 mm on image 88 of series 4.  Hypermetabolism about the left iliac bone, just above the acetabulum. Likely degenerative and/or due to muscular strain.  CT IMAGES PERFORMED FOR ATTENUATION CORRECTION  No cervical adenopathy.  Chest findings deferred to recent diagnostic CT. Multivessel coronary artery atherosclerosis. Bilateral mastectomy and right axillary node dissection. Right upper lobe radiation fibrosis.  Bilateral nephrolithiasis.  Hysterectomy.  IMPRESSION: 1. Resolution of previously described chest wall hypermetabolism, without hypermetabolic recurrent or metastatic disease. 2. Hypermetabolism within anterior right fourth through sixth ribs. Likely due to nondisplaced fractures, possibly superimposed upon a component of radiation necrosis. The soft tissue fullness about the adjacent anterior right chest wall described on 12/13/2014 is decreased, and was likely due to overlying hematoma.   Electronically Signed   By: Abigail Miyamoto M.D.   On: 12/24/2014 10:53     ASSESSMENT: 81 y.o. [place] woman with  locally recurrent breast cancer, as follows:  (1) status post right lumpectomy and sentinel node sampling September 2000 for ductal carcinoma in situ (Ni+), estrogen and progesterone receptor positive, with a low proliferation index,  (a) received 5040 cGy to the right breast and regional lymph nodes  (b) on tamoxifen less than 3 months  (2) s/p bilateral mastectomies and right axillary lymph node sampling 07/29/2011, showing  (a) in the left breast, ductal carcinoma in situ measuring 1.2 cm, grade 1, with negative margins, estrogen and progesterone receptor positive  (b)  on the right, a pT1c pN0, stage IA invasive ductal carcinoma, estrogen receptor and 93% positive, progesterone receptor 87% positive, with an MIB-1 of 35%, and no HER-2 amplification margins were close but negative  (c) did not receive adjuvant antiestrogen therapy  (3) right chest wall skin recurrence resected 01/21/2014, measuring 1.7 cm, with positive margins, estrogen receptor 100% positive, progesterone receptor 75% positive, with an MIB-1 of 80%, and no HER-2 amplification; PET scan 02/15/2014 negative except for Right chest wall focus  (a) received 45 Gy electron-beam therapy to the right chest wall completed 03/27/2014  (b) skin progression within and without the radiation field noted 04/30/2014  (c)  letrozole started 05/13/2014  (d) to start palbociclib when clinically stable  (4) dexa scan 05/09/2014 at [facility] shows osteoporosis with a T score of -2.9  PLAN: Cailah is doing fairly well today. The labs were reviewed in detail and were stable. She will proceed with her next fulvestrant injection then move on to monthly treatments.   We discussed her pain regimen, and right now she agrees that it is sufficient. She was given refills last week. Her daughter had questions related to genetic testing, and I have sent a message to [facility] to see if an official referral would be appropriate.   Aleisa  will return in 4 months for her next office visit. She understands and agrees with this plan. She knows the goal of treatment in her case is control. She has been encouraged to call with any issues that might arise before her next visit here.   Laurie Panda, NP   01/22/2015 2:25 PM

## 2015-01-27 ENCOUNTER — Telehealth: Payer: Self-pay | Admitting: *Deleted

## 2015-01-27 NOTE — Telephone Encounter (Signed)
Patient spoke with a Triage nurse asking to cancel her previous request.  Per Tammi "patient will have her neighbor come to her home to assist her and observe when she bathes".

## 2015-01-27 NOTE — Telephone Encounter (Signed)
"  I was discharged from the hospital with chest pain from scar tissue,s.o.b. with PT and Home Health to help with bath and around the house.  I cancelled the home health.  I did receive PT for a few days.  I am getting worse and now would like a new order  For someone to help with baths.  I can't bath or shower.  Can't do housework.  Everything on my right upper body is limited.  Instead of getting better, it's getting worse."  Will notify Dr. Jana Hakim.  Next F/U is on 05-14-2015 at [facility].  Cardiology F/U is 02-12-2015.

## 2015-01-28 NOTE — Telephone Encounter (Signed)
Please cancel HHN as requested

## 2015-01-28 NOTE — Telephone Encounter (Signed)
Angel [name] after observing Dr. Virgie Dad response.  Angel [name] with [facility] reports this case was closed on January 03, 2015.  Asked what steps are needed to re-open.  [facility] has documentation of patient cancelling the last four physical therapy visits and this action cancelled the Home Health.  [facility] does not do these things without a skilled service being provided. And these sesrvices are short term.  [facility] has received call from patient as well requesting meal preperation, house cleaning and bathing.  If patient has a skilled need they will be glad to open case.  [name] patient can call [facility].  [facility] provides CNA's for [place] [facility] does not take Medicare Complete.  Rate starts at $16.00/hr pending a nurses assessment with a three hour minimum.  [address] Ms[address] Sanor notified of [facility] and Home Medical illustrator Care information.  Denies need for physical therapy and denies need for Home Health.  "No I don't need help.  I'm sometimes more unsteady than others and a little unsteady at times getting in and out of bath/shower but I have a bar and a seat.  I like to take a bath three times a week.  I live in a condo and my neighbor will come to help.  I also am having upper body pain but will begin an exercise class for seniors next week with a friend.  I am very independent and want to stay independent so it's been hard to ask for help.  My church is close knit circle said they're eager to come and help.  I will also eat a heart healthy and cancer diet.  Thanks for calling me back but I don't need help because my church and neighbors will help.  My children are out of town, two are on disability and I have no family near."  Advised that she call for help if this plan does not meet her expectations as we want her to be safe.

## 2015-01-28 NOTE — Telephone Encounter (Signed)
Thank you :)

## 2015-01-30 ENCOUNTER — Other Ambulatory Visit: Payer: Self-pay | Admitting: Nurse Practitioner

## 2015-01-30 DIAGNOSIS — C50911 Malignant neoplasm of unspecified site of right female breast: Secondary | ICD-10-CM

## 2015-02-04 ENCOUNTER — Telehealth: Payer: Self-pay

## 2015-02-04 NOTE — Telephone Encounter (Signed)
Pt asked if she can have a referral to a dermatologist. She had an appt and when she got to the office of her ex-dermatologist, they no longer take her insurance. She has an area on her L lower neck that is flakey, itchy and burning. Has been there for about 3 months.

## 2015-02-06 NOTE — Telephone Encounter (Signed)
Please ascertain which dermatologists accept pt's insureance and we can place referral. In the meantime she can try cortaid cream BID for relief.  Thank you!

## 2015-02-07 NOTE — Telephone Encounter (Signed)
S/w pt that she needs to call her insurance company and find out which dermatologists are on her plan. She can call the dermatologist directly and ask if they need a referral. We will gladly send referral if needed

## 2015-02-11 DIAGNOSIS — C50919 Malignant neoplasm of unspecified site of unspecified female breast: Secondary | ICD-10-CM | POA: Diagnosis not present

## 2015-02-11 DIAGNOSIS — H179 Unspecified corneal scar and opacity: Secondary | ICD-10-CM | POA: Diagnosis not present

## 2015-02-11 DIAGNOSIS — H35351 Cystoid macular degeneration, right eye: Secondary | ICD-10-CM | POA: Diagnosis not present

## 2015-02-11 DIAGNOSIS — Z880 Allergy status to penicillin: Secondary | ICD-10-CM | POA: Diagnosis not present

## 2015-02-11 DIAGNOSIS — H35341 Macular cyst, hole, or pseudohole, right eye: Secondary | ICD-10-CM | POA: Diagnosis not present

## 2015-02-11 DIAGNOSIS — H269 Unspecified cataract: Secondary | ICD-10-CM | POA: Diagnosis not present

## 2015-02-11 DIAGNOSIS — I739 Peripheral vascular disease, unspecified: Secondary | ICD-10-CM | POA: Diagnosis not present

## 2015-02-11 DIAGNOSIS — H35371 Puckering of macula, right eye: Secondary | ICD-10-CM | POA: Diagnosis not present

## 2015-02-12 ENCOUNTER — Ambulatory Visit: Payer: Medicare Other | Admitting: Cardiology

## 2015-02-12 ENCOUNTER — Other Ambulatory Visit: Payer: Medicare Other

## 2015-02-12 DIAGNOSIS — M5136 Other intervertebral disc degeneration, lumbar region: Secondary | ICD-10-CM | POA: Diagnosis not present

## 2015-02-12 DIAGNOSIS — M5030 Other cervical disc degeneration, unspecified cervical region: Secondary | ICD-10-CM | POA: Diagnosis not present

## 2015-02-14 ENCOUNTER — Emergency Department (HOSPITAL_COMMUNITY)
Admission: EM | Admit: 2015-02-14 | Discharge: 2015-02-15 | Disposition: A | Discharge status: Home / Self Care | Payer: Medicare Other | Attending: Emergency Medicine | Admitting: Emergency Medicine

## 2015-02-14 ENCOUNTER — Emergency Department (HOSPITAL_COMMUNITY): Payer: Medicare Other

## 2015-02-14 ENCOUNTER — Encounter (HOSPITAL_COMMUNITY): Payer: Self-pay | Admitting: *Deleted

## 2015-02-14 DIAGNOSIS — J449 Chronic obstructive pulmonary disease, unspecified: Secondary | ICD-10-CM | POA: Diagnosis not present

## 2015-02-14 DIAGNOSIS — Z853 Personal history of malignant neoplasm of breast: Secondary | ICD-10-CM | POA: Diagnosis not present

## 2015-02-14 DIAGNOSIS — S40021A Contusion of right upper arm, initial encounter: Secondary | ICD-10-CM | POA: Diagnosis not present

## 2015-02-14 DIAGNOSIS — Y92410 Unspecified street and highway as the place of occurrence of the external cause: Secondary | ICD-10-CM | POA: Insufficient documentation

## 2015-02-14 DIAGNOSIS — H269 Unspecified cataract: Secondary | ICD-10-CM | POA: Insufficient documentation

## 2015-02-14 DIAGNOSIS — F419 Anxiety disorder, unspecified: Secondary | ICD-10-CM | POA: Diagnosis not present

## 2015-02-14 DIAGNOSIS — Y998 Other external cause status: Secondary | ICD-10-CM | POA: Diagnosis not present

## 2015-02-14 DIAGNOSIS — E785 Hyperlipidemia, unspecified: Secondary | ICD-10-CM | POA: Diagnosis not present

## 2015-02-14 DIAGNOSIS — Z9889 Other specified postprocedural states: Secondary | ICD-10-CM | POA: Diagnosis not present

## 2015-02-14 DIAGNOSIS — Z79899 Other long term (current) drug therapy: Secondary | ICD-10-CM | POA: Diagnosis not present

## 2015-02-14 DIAGNOSIS — M25561 Pain in right knee: Secondary | ICD-10-CM | POA: Diagnosis not present

## 2015-02-14 DIAGNOSIS — R1011 Right upper quadrant pain: Secondary | ICD-10-CM | POA: Diagnosis not present

## 2015-02-14 DIAGNOSIS — S060X1A Concussion with loss of consciousness of 30 minutes or less, initial encounter: Secondary | ICD-10-CM | POA: Diagnosis not present

## 2015-02-14 DIAGNOSIS — Z8719 Personal history of other diseases of the digestive system: Secondary | ICD-10-CM | POA: Insufficient documentation

## 2015-02-14 DIAGNOSIS — Z7951 Long term (current) use of inhaled steroids: Secondary | ICD-10-CM | POA: Insufficient documentation

## 2015-02-14 DIAGNOSIS — S6991XA Unspecified injury of right wrist, hand and finger(s), initial encounter: Secondary | ICD-10-CM | POA: Diagnosis not present

## 2015-02-14 DIAGNOSIS — Z7982 Long term (current) use of aspirin: Secondary | ICD-10-CM | POA: Insufficient documentation

## 2015-02-14 DIAGNOSIS — S3991XA Unspecified injury of abdomen, initial encounter: Secondary | ICD-10-CM | POA: Diagnosis not present

## 2015-02-14 DIAGNOSIS — Z880 Allergy status to penicillin: Secondary | ICD-10-CM | POA: Insufficient documentation

## 2015-02-14 DIAGNOSIS — Z923 Personal history of irradiation: Secondary | ICD-10-CM | POA: Diagnosis not present

## 2015-02-14 DIAGNOSIS — Y9389 Activity, other specified: Secondary | ICD-10-CM | POA: Insufficient documentation

## 2015-02-14 DIAGNOSIS — I10 Essential (primary) hypertension: Secondary | ICD-10-CM | POA: Insufficient documentation

## 2015-02-14 DIAGNOSIS — S299XXA Unspecified injury of thorax, initial encounter: Secondary | ICD-10-CM | POA: Diagnosis not present

## 2015-02-14 DIAGNOSIS — S0990XA Unspecified injury of head, initial encounter: Secondary | ICD-10-CM | POA: Diagnosis not present

## 2015-02-14 DIAGNOSIS — G8929 Other chronic pain: Secondary | ICD-10-CM | POA: Diagnosis not present

## 2015-02-14 DIAGNOSIS — I2510 Atherosclerotic heart disease of native coronary artery without angina pectoris: Secondary | ICD-10-CM | POA: Insufficient documentation

## 2015-02-14 DIAGNOSIS — F329 Major depressive disorder, single episode, unspecified: Secondary | ICD-10-CM | POA: Insufficient documentation

## 2015-02-14 DIAGNOSIS — Z87442 Personal history of urinary calculi: Secondary | ICD-10-CM | POA: Insufficient documentation

## 2015-02-14 DIAGNOSIS — S8991XA Unspecified injury of right lower leg, initial encounter: Secondary | ICD-10-CM | POA: Diagnosis not present

## 2015-02-14 DIAGNOSIS — M1990 Unspecified osteoarthritis, unspecified site: Secondary | ICD-10-CM | POA: Insufficient documentation

## 2015-02-14 DIAGNOSIS — M810 Age-related osteoporosis without current pathological fracture: Secondary | ICD-10-CM | POA: Insufficient documentation

## 2015-02-14 DIAGNOSIS — S199XXA Unspecified injury of neck, initial encounter: Secondary | ICD-10-CM | POA: Diagnosis not present

## 2015-02-14 DIAGNOSIS — R0789 Other chest pain: Secondary | ICD-10-CM | POA: Diagnosis not present

## 2015-02-14 DIAGNOSIS — Z85828 Personal history of other malignant neoplasm of skin: Secondary | ICD-10-CM | POA: Insufficient documentation

## 2015-02-14 DIAGNOSIS — S3993XA Unspecified injury of pelvis, initial encounter: Secondary | ICD-10-CM | POA: Diagnosis not present

## 2015-02-14 DIAGNOSIS — M79641 Pain in right hand: Secondary | ICD-10-CM | POA: Diagnosis not present

## 2015-02-14 DIAGNOSIS — S31609A Unspecified open wound of abdominal wall, unspecified quadrant with penetration into peritoneal cavity, initial encounter: Secondary | ICD-10-CM | POA: Diagnosis not present

## 2015-02-14 DIAGNOSIS — R079 Chest pain, unspecified: Secondary | ICD-10-CM | POA: Diagnosis not present

## 2015-02-14 DIAGNOSIS — R100 Acute abdomen: Secondary | ICD-10-CM | POA: Diagnosis not present

## 2015-02-14 LAB — COMPREHENSIVE METABOLIC PANEL
ALK PHOS: 47 U/L (ref 38–126)
ALT: 17 U/L (ref 14–54)
AST: 23 U/L (ref 15–41)
Albumin: 3.9 g/dL (ref 3.5–5.0)
Anion gap: 11 (ref 5–15)
BUN: 13 mg/dL (ref 6–20)
CALCIUM: 9.2 mg/dL (ref 8.9–10.3)
CO2: 26 mmol/L (ref 22–32)
Chloride: 100 mmol/L — ABNORMAL LOW (ref 101–111)
Creatinine, Ser: 0.74 mg/dL (ref 0.44–1.00)
GLUCOSE: 131 mg/dL — AB (ref 65–99)
Potassium: 4 mmol/L (ref 3.5–5.1)
SODIUM: 137 mmol/L (ref 135–145)
TOTAL PROTEIN: 6.5 g/dL (ref 6.5–8.1)
Total Bilirubin: 0.4 mg/dL (ref 0.3–1.2)

## 2015-02-14 LAB — URINE MICROSCOPIC-ADD ON

## 2015-02-14 LAB — CBC
HEMATOCRIT: 38.8 % (ref 36.0–46.0)
HEMOGLOBIN: 12.9 g/dL (ref 12.0–15.0)
MCH: 30.1 pg (ref 26.0–34.0)
MCHC: 33.2 g/dL (ref 30.0–36.0)
MCV: 90.7 fL (ref 78.0–100.0)
Platelets: 241 10*3/uL (ref 150–400)
RBC: 4.28 MIL/uL (ref 3.87–5.11)
RDW: 12.6 % (ref 11.5–15.5)
WBC: 7.7 10*3/uL (ref 4.0–10.5)

## 2015-02-14 LAB — SAMPLE TO BLOOD BANK

## 2015-02-14 LAB — URINALYSIS, ROUTINE W REFLEX MICROSCOPIC
Bilirubin Urine: NEGATIVE
Glucose, UA: NEGATIVE mg/dL
KETONES UR: NEGATIVE mg/dL
NITRITE: NEGATIVE
Protein, ur: NEGATIVE mg/dL
SPECIFIC GRAVITY, URINE: 1.008 (ref 1.005–1.030)
Urobilinogen, UA: 0.2 mg/dL (ref 0.0–1.0)
pH: 6.5 (ref 5.0–8.0)

## 2015-02-14 LAB — PROTIME-INR
INR: 1.01 (ref 0.00–1.49)
Prothrombin Time: 13.5 seconds (ref 11.6–15.2)

## 2015-02-14 LAB — ETHANOL: Alcohol, Ethyl (B): <5 mg/dL (ref <5)

## 2015-02-14 LAB — CDS SEROLOGY

## 2015-02-14 MED ORDER — IOHEXOL 300 MG/ML  SOLN
100.0000 mL | Freq: Once | INTRAMUSCULAR | Status: AC | PRN
  Administered 2015-02-14: 100 mL via INTRAVENOUS

## 2015-02-14 MED ORDER — HYDROCODONE-ACETAMINOPHEN 5-325 MG PO TABS
1.0000 | ORAL_TABLET | Freq: Once | ORAL | Status: AC
  Administered 2015-02-15: 1 via ORAL
  Filled 2015-02-14: qty 1

## 2015-02-14 MED ORDER — SODIUM CHLORIDE 0.9 % IV SOLN
Freq: Once | INTRAVENOUS | Status: AC
  Administered 2015-02-14: 20:00:00 via INTRAVENOUS

## 2015-02-14 MED ORDER — METOCLOPRAMIDE HCL 5 MG/ML IJ SOLN
5.0000 mg | Freq: Once | INTRAMUSCULAR | Status: AC
  Administered 2015-02-14: 21:00:00 via INTRAVENOUS
  Filled 2015-02-14: qty 2

## 2015-02-14 MED ORDER — MORPHINE SULFATE 4 MG/ML IJ SOLN
4.0000 mg | Freq: Once | INTRAMUSCULAR | Status: AC
  Administered 2015-02-14: 4 mg via INTRAVENOUS
  Filled 2015-02-14: qty 1

## 2015-02-14 NOTE — ED Notes (Signed)
EDP to the bedside to speak with pt
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
## 2015-02-14 NOTE — ED Notes (Signed)
Pt is still in radiology, went from xray to CT

## 2015-02-14 NOTE — ED Notes (Signed)
Pt is alert and oriented, family is at the bedside

## 2015-02-14 NOTE — ED Notes (Signed)
Family at bedside. 

## 2015-02-14 NOTE — ED Notes (Signed)
Pt is in radiology.

## 2015-02-14 NOTE — ED Notes (Signed)
Patient transported to X-ray 

## 2015-02-15 LAB — TROPONIN I: Troponin I: <0.03 ng/mL (ref <0.031)

## 2015-02-15 MED ORDER — HYDROCODONE-ACETAMINOPHEN 5-325 MG PO TABS: 1.0000 | ORAL_TABLET | Freq: Four times a day (QID) | ORAL | Status: DC | PRN

## 2015-02-15 MED ORDER — HYDROCODONE-ACETAMINOPHEN 5-325 MG PO TABS
1.0000 | ORAL_TABLET | Freq: Once | ORAL | Status: AC
  Administered 2015-02-15: 1 via ORAL
  Filled 2015-02-15: qty 1

## 2015-02-15 MED ORDER — SODIUM CHLORIDE 0.9 % IV BOLUS (SEPSIS)
1000.0000 mL | Freq: Once | INTRAVENOUS | Status: AC
  Administered 2015-02-15: 1000 mL via INTRAVENOUS

## 2015-02-15 NOTE — ED Notes (Signed)
Dr. Milagros Evener aware of BP 80/60, verbal order for 1L bolus of normal saline.

## 2015-02-15 NOTE — ED Notes (Signed)
ekg given to Dr. Milagros Evener.
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
## 2015-02-18 ENCOUNTER — Telehealth: Payer: Self-pay | Admitting: Oncology

## 2015-02-18 NOTE — Telephone Encounter (Signed)
Returned Advertising account executive. Patient did not want to see Genetics and did not want to reschedule. Confirmed lab & inj for 05/25.

## 2015-02-19 ENCOUNTER — Other Ambulatory Visit (HOSPITAL_BASED_OUTPATIENT_CLINIC_OR_DEPARTMENT_OTHER): Payer: Medicare Other

## 2015-02-19 ENCOUNTER — Ambulatory Visit (HOSPITAL_BASED_OUTPATIENT_CLINIC_OR_DEPARTMENT_OTHER): Payer: Medicare Other

## 2015-02-19 ENCOUNTER — Other Ambulatory Visit: Payer: Medicare Other

## 2015-02-19 VITALS — BP 126/76 | HR 67 | Temp 97.9°F

## 2015-02-19 DIAGNOSIS — C50811 Malignant neoplasm of overlapping sites of right female breast: Secondary | ICD-10-CM

## 2015-02-19 DIAGNOSIS — D0512 Intraductal carcinoma in situ of left breast: Secondary | ICD-10-CM | POA: Diagnosis not present

## 2015-02-19 DIAGNOSIS — C44501 Unspecified malignant neoplasm of skin of breast: Secondary | ICD-10-CM | POA: Diagnosis not present

## 2015-02-19 DIAGNOSIS — Z170 Estrogen receptor positive status [ER+]: Secondary | ICD-10-CM | POA: Diagnosis not present

## 2015-02-19 DIAGNOSIS — Z5111 Encounter for antineoplastic chemotherapy: Secondary | ICD-10-CM | POA: Diagnosis not present

## 2015-02-19 DIAGNOSIS — Z853 Personal history of malignant neoplasm of breast: Secondary | ICD-10-CM | POA: Diagnosis not present

## 2015-02-19 DIAGNOSIS — G8929 Other chronic pain: Secondary | ICD-10-CM

## 2015-02-19 DIAGNOSIS — C50911 Malignant neoplasm of unspecified site of right female breast: Secondary | ICD-10-CM

## 2015-02-19 DIAGNOSIS — M810 Age-related osteoporosis without current pathological fracture: Secondary | ICD-10-CM

## 2015-02-19 LAB — COMPREHENSIVE METABOLIC PANEL (CC13)
ALBUMIN: 3.6 g/dL (ref 3.5–5.0)
ALT: 10 U/L (ref 0–55)
ANION GAP: 10 meq/L (ref 3–11)
AST: 15 U/L (ref 5–34)
Alkaline Phosphatase: 53 U/L (ref 40–150)
BUN: 16.9 mg/dL (ref 7.0–26.0)
CALCIUM: 9.2 mg/dL (ref 8.4–10.4)
CHLORIDE: 101 meq/L (ref 98–109)
CO2: 25 meq/L (ref 22–29)
Creatinine: 0.7 mg/dL (ref 0.6–1.1)
EGFR: 77 mL/min/{1.73_m2} — AB (ref >90)
GLUCOSE: 79 mg/dL (ref 70–140)
Potassium: 4.5 mEq/L (ref 3.5–5.1)
Sodium: 136 mEq/L (ref 136–145)
Total Bilirubin: 0.34 mg/dL (ref 0.20–1.20)
Total Protein: 6.5 g/dL (ref 6.4–8.3)

## 2015-02-19 LAB — CBC WITH DIFFERENTIAL/PLATELET
BASO%: 0.9 % (ref 0.0–2.0)
Basophils Absolute: 0.1 10*3/uL (ref 0.0–0.1)
EOS%: 0.9 % (ref 0.0–7.0)
Eosinophils Absolute: 0.1 10*3/uL (ref 0.0–0.5)
HCT: 38.5 % (ref 34.8–46.6)
HGB: 12.7 g/dL (ref 11.6–15.9)
LYMPH%: 25.5 % (ref 14.0–49.7)
MCH: 30.2 pg (ref 25.1–34.0)
MCHC: 33 g/dL (ref 31.5–36.0)
MCV: 91.5 fL (ref 79.5–101.0)
MONO#: 0.6 10*3/uL (ref 0.1–0.9)
MONO%: 7.5 % (ref 0.0–14.0)
NEUT%: 65.2 % (ref 38.4–76.8)
NEUTROS ABS: 5.1 10*3/uL (ref 1.5–6.5)
Platelets: 244 10*3/uL (ref 145–400)
RBC: 4.21 10*6/uL (ref 3.70–5.45)
RDW: 12.6 % (ref 11.2–14.5)
WBC: 7.9 10*3/uL (ref 3.9–10.3)
lymph#: 2 10*3/uL (ref 0.9–3.3)

## 2015-02-19 MED ORDER — FULVESTRANT 250 MG/5ML IM SOLN
500.0000 mg | Freq: Once | INTRAMUSCULAR | Status: AC
  Administered 2015-02-19: 500 mg via INTRAMUSCULAR
  Filled 2015-02-19: qty 10

## 2015-02-21 ENCOUNTER — Encounter: Payer: Self-pay | Admitting: Internal Medicine

## 2015-02-26 ENCOUNTER — Telehealth: Payer: Self-pay | Admitting: *Deleted

## 2015-02-26 NOTE — Telephone Encounter (Signed)
Angel [name] called asking "what is the name of medication mom receives here to block her estrogen?"  [employer] name given to [place].

## 2015-03-05 ENCOUNTER — Other Ambulatory Visit: Payer: Self-pay | Admitting: *Deleted

## 2015-03-05 DIAGNOSIS — Z853 Personal history of malignant neoplasm of breast: Secondary | ICD-10-CM

## 2015-03-05 MED ORDER — OXYCODONE-ACETAMINOPHEN 10-325 MG PO TABS: 1.0000 | ORAL_TABLET | ORAL | Status: DC | PRN

## 2015-03-05 MED ORDER — OXYMORPHONE HCL ER 5 MG PO TB12: 5.0000 mg | ORAL_TABLET | Freq: Two times a day (BID) | ORAL | Status: DC

## 2015-03-05 NOTE — Telephone Encounter (Signed)
Refill patient prescription: Opana/Percocet. Patient notifed and verbalized understanding.

## 2015-03-13 ENCOUNTER — Encounter: Payer: Self-pay | Admitting: Oncology

## 2015-03-13 ENCOUNTER — Other Ambulatory Visit: Payer: Self-pay | Admitting: Family Medicine

## 2015-03-13 NOTE — Telephone Encounter (Signed)
Last seen 01/06/15 and filled 01/16/15 #45 with 1 refill   Please advise    KP

## 2015-03-13 NOTE — Progress Notes (Signed)
Called patient back and gave her billing ph#. She wants copies of her bills.

## 2015-03-14 ENCOUNTER — Encounter: Payer: Self-pay | Admitting: Physician Assistant

## 2015-03-14 ENCOUNTER — Telehealth: Payer: Self-pay | Admitting: *Deleted

## 2015-03-14 ENCOUNTER — Ambulatory Visit (INDEPENDENT_AMBULATORY_CARE_PROVIDER_SITE_OTHER): Payer: Medicare Other | Admitting: Physician Assistant

## 2015-03-14 DIAGNOSIS — M545 Low back pain: Secondary | ICD-10-CM | POA: Diagnosis not present

## 2015-03-14 MED ORDER — SUVOREXANT 10 MG PO TABS: 1.0000 | ORAL_TABLET | Freq: Every day | ORAL | Status: DC

## 2015-03-14 MED ORDER — SUVOREXANT 10 MG PO TABS
1.0000 | ORAL_TABLET | Freq: Every day | ORAL | Status: DC
Start: 2015-03-14 — End: 2015-03-14

## 2015-03-14 MED ORDER — OXYMORPHONE HCL ER 5 MG PO TB12: 5.0000 mg | ORAL_TABLET | Freq: Two times a day (BID) | ORAL | Status: DC
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
## 2015-03-14 NOTE — Patient Instructions (Signed)
Please continue medications as directed. Start the Belsomra nightly at bedtime.  Remember only take when you can devote 7-8 hours to sleep.  Use this instead of bedtime Valium dose. Apply topical Salon Pas or Icy Hot patch to back.  Continue the Opana.  Follow-up with your Orthopedist next week as scheduled for injection.

## 2015-03-14 NOTE — Progress Notes (Signed)
Pre visit review using our clinic review tool, if applicable. No additional management support is needed unless otherwise documented below in the visit note/SLS  

## 2015-03-16 NOTE — Assessment & Plan Note (Signed)
With exacerbation of chronic back pain and acute stress reaction culminating in insomnia and increased anxiety. Has scheduled follow-up with Orthopedist for epidural injection.  Pain medications refilled.  Supportive measures reviewed with patient.  Continue Valium as prescribed.  Discussed insomnia treatment options with patient.  Patient does not want to rely on Valium.  Will begin 10-day trial of Belsomra 10 mg nightly to help reset her sleep cycle.  Discussed counseling but patient declines at present. Follow-up 2 weeks.

## 2015-03-17 ENCOUNTER — Telehealth: Payer: Self-pay | Admitting: Family Medicine

## 2015-03-17 NOTE — Telephone Encounter (Signed)
Caller name: Katya Rolston Relationship to patient: self Can be reached: [phone] Pharmacy: [facility]  Reason for call: Pt called in stating that [place] can be filled by [facility]. She said if they get RX today they can order tonight for her to pick it up tomorrow. The fax # is [phone].

## 2015-03-17 NOTE — Telephone Encounter (Signed)
The same was discussed with patient.  She stated understanding.  No further needs voiced at this time.

## 2015-03-17 NOTE — Telephone Encounter (Signed)
She has been given Rx at her visit.  She needs to take it to [facility] so they can fill it. We can always fax the belsomra but she has the voucher that will make the medication more affordable and will need to carry it with her to get the free trial.

## 2015-03-17 NOTE — Telephone Encounter (Signed)
Patient called stating that her [facility] did not have the Opana Rx and would not take the Belsomra Rx because it was on the same script as Opana; informed patient that each script is a separate Rx and that she could detached the Opana prescription and give her [facility] [place] script to fill [pt is going to a different pharmacy for the [place], but needed [place] for sleep. Patient informed, understood & agreed to separate Rx scripts/SLS

## 2015-03-19 ENCOUNTER — Ambulatory Visit (HOSPITAL_BASED_OUTPATIENT_CLINIC_OR_DEPARTMENT_OTHER): Payer: Medicare Other

## 2015-03-19 ENCOUNTER — Other Ambulatory Visit (HOSPITAL_BASED_OUTPATIENT_CLINIC_OR_DEPARTMENT_OTHER): Payer: Medicare Other

## 2015-03-19 VITALS — BP 123/72 | HR 68 | Temp 98.5°F

## 2015-03-19 DIAGNOSIS — C50911 Malignant neoplasm of unspecified site of right female breast: Secondary | ICD-10-CM

## 2015-03-19 DIAGNOSIS — Z5111 Encounter for antineoplastic chemotherapy: Secondary | ICD-10-CM

## 2015-03-19 DIAGNOSIS — D0512 Intraductal carcinoma in situ of left breast: Secondary | ICD-10-CM

## 2015-03-19 DIAGNOSIS — M810 Age-related osteoporosis without current pathological fracture: Secondary | ICD-10-CM

## 2015-03-19 DIAGNOSIS — G8929 Other chronic pain: Secondary | ICD-10-CM

## 2015-03-19 LAB — CBC WITH DIFFERENTIAL/PLATELET
BASO%: 0.5 % (ref 0.0–2.0)
BASOS ABS: 0 10*3/uL (ref 0.0–0.1)
EOS%: 1.6 % (ref 0.0–7.0)
Eosinophils Absolute: 0.1 10*3/uL (ref 0.0–0.5)
HEMATOCRIT: 39.4 % (ref 34.8–46.6)
HEMOGLOBIN: 12.8 g/dL (ref 11.6–15.9)
LYMPH%: 41.6 % (ref 14.0–49.7)
MCH: 30 pg (ref 25.1–34.0)
MCHC: 32.5 g/dL (ref 31.5–36.0)
MCV: 92.3 fL (ref 79.5–101.0)
MONO#: 0.4 10*3/uL (ref 0.1–0.9)
MONO%: 9.7 % (ref 0.0–14.0)
NEUT#: 2 10*3/uL (ref 1.5–6.5)
NEUT%: 46.6 % (ref 38.4–76.8)
PLATELETS: 206 10*3/uL (ref 145–400)
RBC: 4.27 10*6/uL (ref 3.70–5.45)
RDW: 13.1 % (ref 11.2–14.5)
WBC: 4.4 10*3/uL (ref 3.9–10.3)
lymph#: 1.8 10*3/uL (ref 0.9–3.3)

## 2015-03-19 LAB — COMPREHENSIVE METABOLIC PANEL (CC13)
ALBUMIN: 3.6 g/dL (ref 3.5–5.0)
ALT: 12 U/L (ref 0–55)
AST: 16 U/L (ref 5–34)
Alkaline Phosphatase: 68 U/L (ref 40–150)
Anion Gap: 5 mEq/L (ref 3–11)
BUN: 11.3 mg/dL (ref 7.0–26.0)
CALCIUM: 9.1 mg/dL (ref 8.4–10.4)
CO2: 32 meq/L — AB (ref 22–29)
Chloride: 103 mEq/L (ref 98–109)
Creatinine: 0.7 mg/dL (ref 0.6–1.1)
EGFR: 76 mL/min/{1.73_m2} — AB (ref >90)
GLUCOSE: 97 mg/dL (ref 70–140)
POTASSIUM: 4.2 meq/L (ref 3.5–5.1)
Sodium: 140 mEq/L (ref 136–145)
Total Bilirubin: 0.33 mg/dL (ref 0.20–1.20)
Total Protein: 6.2 g/dL — ABNORMAL LOW (ref 6.4–8.3)

## 2015-03-19 MED ORDER — FULVESTRANT 250 MG/5ML IM SOLN
500.0000 mg | Freq: Once | INTRAMUSCULAR | Status: AC
Start: 2015-03-19 — End: 2015-03-19
  Administered 2015-03-19: 500 mg via INTRAMUSCULAR
  Filled 2015-03-19: qty 10

## 2015-03-28 ENCOUNTER — Ambulatory Visit: Payer: Medicare Other | Admitting: Family Medicine

## 2015-04-01 DIAGNOSIS — M5136 Other intervertebral disc degeneration, lumbar region: Secondary | ICD-10-CM | POA: Diagnosis not present

## 2015-04-02 ENCOUNTER — Telehealth: Payer: Self-pay | Admitting: *Deleted

## 2015-04-02 NOTE — Telephone Encounter (Signed)
Patient called requesting "refill on pain med be faxed in for stage IV cancer.  I've had double mastectomy that is now in the chest wall with scar tissue."  Informed her she will have to pick up the hard copy of the Norco.  "I use Perocet 10-325 mg, Take one every 4 hours, quantity of sixty, filled 03-05-2015 by Dr. Jana Hakim.  I'll send someone over within the next two days to pick this up."  Will notify Dr. Jana Hakim of this request.  Return number [phone].

## 2015-04-03 ENCOUNTER — Other Ambulatory Visit: Payer: Self-pay | Admitting: *Deleted

## 2015-04-03 DIAGNOSIS — Z853 Personal history of malignant neoplasm of breast: Secondary | ICD-10-CM

## 2015-04-03 MED ORDER — OXYCODONE-ACETAMINOPHEN 10-325 MG PO TABS: 1.0000 | ORAL_TABLET | ORAL | Status: DC | PRN

## 2015-04-03 NOTE — Telephone Encounter (Signed)
Obtaining signed script for pick up- note - Dr Jannifer Rodney prescribes percocet 10-325. Med was deleted from pt's med list per visit with primary MD unknown reason- per note states " continue pain medicaitons".

## 2015-04-04 ENCOUNTER — Telehealth: Payer: Self-pay | Admitting: Family Medicine

## 2015-04-04 NOTE — Telephone Encounter (Signed)
pre visit letter mailed 04/04/15

## 2015-04-09 DIAGNOSIS — M5033 Other cervical disc degeneration, cervicothoracic region: Secondary | ICD-10-CM | POA: Diagnosis not present

## 2015-04-10 ENCOUNTER — Other Ambulatory Visit: Payer: Self-pay | Admitting: Family Medicine

## 2015-04-11 ENCOUNTER — Encounter: Payer: Self-pay | Admitting: Cardiology

## 2015-04-11 ENCOUNTER — Ambulatory Visit (INDEPENDENT_AMBULATORY_CARE_PROVIDER_SITE_OTHER): Payer: Medicare Other | Admitting: Cardiology

## 2015-04-11 VITALS — BP 116/68 | HR 71 | Ht 62.0 in | Wt 126.0 lb

## 2015-04-11 DIAGNOSIS — I10 Essential (primary) hypertension: Secondary | ICD-10-CM | POA: Diagnosis not present

## 2015-04-11 DIAGNOSIS — I2510 Atherosclerotic heart disease of native coronary artery without angina pectoris: Secondary | ICD-10-CM

## 2015-04-11 DIAGNOSIS — I493 Ventricular premature depolarization: Secondary | ICD-10-CM | POA: Diagnosis not present

## 2015-04-11 DIAGNOSIS — I2583 Coronary atherosclerosis due to lipid rich plaque: Principal | ICD-10-CM

## 2015-04-11 MED ORDER — ISOSORBIDE MONONITRATE ER 30 MG PO TB24: 30.0000 mg | ORAL_TABLET | Freq: Every day | ORAL | Status: DC

## 2015-04-11 NOTE — Progress Notes (Signed)
Cardiology Office Note   Date:  04/11/2015   ID:  TERSA [name], DOB 23-Aug-1932, MRN [id number]  PCP:  [name], DO    Chief Complaint  Patient presents with  . Coronary Artery Disease  . Hypertension  . Hyperlipidemia      History of Present Illness: Angel [name] is a 81- y.o. female with a hx of non-obstructive CAD with mid LAD 40-50% stenosis, DDD, HL, breast CA, remote asthma, GERD.This was unchanged from 2012. She has been on several antianginal meds. Ranolazine was added to her medical regimen but she had to stop it after a few days due to severe fatigue. Patient had significant side effects to metoprolol with fatigue and lethargy and it was stopped. She presents back today for routine followup. She was recently in the hospital a few months ago for pain control due to scar tissue from prior breast CA.  She denies any anginal chest pain, SOB, DOE, LE edema, dizziness, palpitations or syncope.      Past Medical History  Diagnosis Date  . Asthma   . Spinal stenosis   . Osteoporosis   . Chronic bronchitis   . DDD (degenerative disc disease)   . Osteoarthritis   . Degenerative joint disease   . Skin cancer of face     non melanoma  . Coronary artery disease Non-obstructive    a. 2012 Cath: LAD 40-79m->Med Rx;  b. 11/2013 Echo: EF 50-55%, nl wall motion;  c. 12/2013 Lexi MV: EF 74%, no ischemia.  . Depression   . Anxiety   . COPD (chronic obstructive pulmonary disease)   . Dyslipidemia   . Cholelithiasis   . GERD (gastroesophageal reflux disease)   . Hx of radiation therapy 07/02/1999- 08/11/1999    right supraclavucular/axillary region: 5040 cGy, 28 fractions  . Breast cancer 05/07/1999, 06/2011    a. s/p bilat mastectomies. recurrence 01/2014. Radiation, meds.  [name] Hx of radiation therapy 02/20/14- 03/27/14    right chest wall 4500 cGy 25 sessions  . Hypertension   . Kidney stones     one stones  . Cataract     eye surgery  planned for 11/2014  . PVC's (premature ventricular contractions)   . Reflux esophagitis   . Pre-diabetes     Past Surgical History  Procedure Laterality Date  . Vesicovaginal fistula closure w/ tah  age 30  . Tubal ligation  1961  . Appendectomy    . Abdominal hysterectomy      in her 30's  . Small intestine surgery  1998    [facility]  . Tonsillectomy    . Breast lumpectomy  2002    right breast  . Mastectomy  07/29/11    bilateral-rt nodes-none on left  . Cardiac catheterization  03/2011, 3/15  . Breast biopsy Right 01/21/2014    Procedure: BREAST/CHEST WALL BIOPSY;  Surgeon: [name], MD;  Location: [facility];  Service: General;  Laterality: Right;  . Vein ligation and stripping Right   . Left heart catheterization with coronary angiogram N/A 12/05/2013    Procedure: LEFT HEART CATHETERIZATION WITH CORONARY ANGIOGRAM;  Surgeon: [name], MD;  Location: [facility] CATH LAB;  Service: Cardiovascular;  Laterality: N/A;  . Left heart catheterization with coronary angiogram N/A 11/06/2014    Procedure: LEFT HEART CATHETERIZATION WITH CORONARY ANGIOGRAM;  Surgeon: [name], MD;  Location: [facility] CATH LAB;  Service:  Cardiovascular;  Laterality: N/A;     Current Outpatient Prescriptions  Medication Sig Dispense Refill  . albuterol (PROVENTIL HFA;VENTOLIN HFA) 108 (90 BASE) MCG/ACT inhaler Inhale into the lungs every 6 (six) hours as needed for wheezing or shortness of breath.    [name] aspirin EC 81 MG tablet Take 1 tablet (81 mg total) by mouth daily.    . B Complex-Biotin-FA (B-COMPLEX PO) Take 1 tablet by mouth daily.     . Biotin 5000 MCG CAPS Take 5,000 mcg by mouth daily.    . budesonide-formoterol (SYMBICORT) 160-4.5 MCG/ACT inhaler Inhale 2 puffs into the lungs 2 (two) times daily as needed. 3 Inhaler 1  . Calcium-Magnesium-Zinc 1000-400-15 MG TABS Take 1 tablet by mouth daily.      . celecoxib (CELEBREX) 200 MG capsule Take 1 capsule daily with food for pain & inflammation 90  capsule 3  . Cholecalciferol (VITAMIN D-3) 5000 UNITS TABS Take 1 tablet by mouth daily.     . citalopram (CELEXA) 40 MG tablet Take 1 tablet (40 mg total) by mouth daily. (Patient taking differently: Take 20 mg by mouth daily. Pt is taking 20 mg right now but slowly increasing to 40 mg) 90 tablet 3  . diazepam (VALIUM) 5 MG tablet TAKE ONE-HALF TO ONE TABLET BY MOUTH EVERY 8 HOURS AS NEEDED FOR ANXIETY 45 tablet 0  . DULoxetine (CYMBALTA) 60 MG capsule Take 1 capsule by mouth  every day for mood (Patient taking differently: Take 60 mg by mouth every other day. Take 1 capsule by mouth  every day for mood) 90 capsule 0  . ezetimibe (ZETIA) 10 MG tablet Take 10 mg by mouth daily.    . Fish Oil OIL Take 1 capsule by mouth daily.     . Flaxseed, Linseed, (GROUND FLAX SEEDS PO) Take 5 mLs by mouth daily.     [name] HYDROcodone-acetaminophen (NORCO) 5-325 MG per tablet Take 1 tablet by mouth every 6 (six) hours as needed for moderate pain or severe pain. (Patient taking differently: Take 1 tablet by mouth every 4 (four) hours as needed for moderate pain or severe pain. ) 20 tablet 0  . isosorbide mononitrate (IMDUR) 30 MG 24 hr tablet Take 1 tablet (30 mg total) by mouth daily. 30 tablet 3  . Misc Natural Products (GLUCOSAMINE CHOND COMPLEX/MSM PO) Take 1 tablet by mouth daily.     . montelukast (SINGULAIR) 10 MG tablet Take 1 tablet (10 mg total) by mouth at bedtime. 90 tablet 2  . Multiple Vitamin (MULTIVITAMIN) tablet Take 1 tablet by mouth daily.    . nitroGLYCERIN (NITROSTAT) 0.4 MG SL tablet Place 1 tablet (0.4 mg total) under the tongue every 5 (five) minutes x 3 doses as needed for chest pain. 25 tablet 3  . ondansetron (ZOFRAN ODT) 4 MG disintegrating tablet Take 1 tablet (4 mg total) by mouth every 8 (eight) hours as needed for nausea or vomiting. 20 tablet 0  . oxyCODONE-acetaminophen (PERCOCET) 10-325 MG per tablet Take 1 tablet by mouth every 4 (four) hours as needed for pain. 60 tablet 0  .  oxymorphone (OPANA ER) 5 MG 12 hr tablet Take 1 tablet (5 mg total) by mouth every 12 (twelve) hours. 60 tablet 0   No current facility-administered medications for this visit.    Allergies:   Penicillins and Adhesive    Social History:  The patient  reports that she has never smoked. She has never used smokeless tobacco. She reports that she drinks alcohol.  She reports that she does not use illicit drugs.   Family History:  The patient's family history includes Asthma in her brother, brother, daughter, and grandchild; Cancer in her mother; Emphysema in her father; Heart disease in her brother and father; Lymphoma in her brother; Pancreatic cancer in her mother; Stroke in her daughter.    ROS:  Please see the history of present illness.   Otherwise, review of systems are positive for none.   All other systems are reviewed and negative.    PHYSICAL EXAM: VS:  BP 116/68 mmHg  Pulse 71  Ht 5\' 2"  (1.575 m)  Wt 126 lb (57.153 kg)  BMI 23.04 kg/m2 , BMI Body mass index is 23.04 kg/(m^2). GEN: Well nourished, well developed, in no acute distress HEENT: normal Neck: no JVD, carotid bruits, or masses Cardiac: RRR; no murmurs, rubs, or gallops,no edema  Respiratory:  clear to auscultation bilaterally, normal work of breathing GI: soft, nontender, nondistended, + BS MS: no deformity or atrophy Skin: warm and dry, no rash Neuro:  Strength and sensation are intact Psych: euthymic mood, full affect   EKG:  EKG is not ordered today.    Recent Labs: 09/06/2014: Magnesium 1.9; TSH 3.234 12/13/2014: B Natriuretic Peptide 61.6 03/19/2015: ALT 12; BUN 11.3; Creatinine 0.7; HGB 12.8; Platelets 206; Potassium 4.2; Sodium 140    Lipid Panel    Component Value Date/Time   CHOL 159 09/06/2014 1042   TRIG 67 09/06/2014 1042   HDL 60 09/06/2014 1042   CHOLHDL 2.7 09/06/2014 1042   VLDL 13 09/06/2014 1042   LDLCALC 86 09/06/2014 1042      Wt Readings from Last 3 Encounters:  04/11/15 126  lb (57.153 kg)  03/14/15 130 lb 8 oz (59.194 kg)  01/22/15 134 lb 14.4 oz (61.19 kg)    ASSESSMENT AND PLAN:  1. Chest Pain: Resolved 2. Nonobstructive CAD: Continue aspirin. She is intolerant of statin and ranexa. Continue ASA/Imdur 3. Hyperlipidemia: Continue Zetia. Statin intolerant. Recheck FLP and ALT with PCP 4.PVCs: She is asymptomatic. She has normal LV function. 5. HTN well controlled    Current medicines are reviewed at length with the patient today.  The patient does not have concerns regarding medicines.  The following changes have been made:  no change  Labs/ tests ordered today: See above Assessment and Plan No orders of the defined types were placed in this encounter.     Disposition:   FU with me in 6 months  Signed, [name], MD  04/11/2015 2:10 PM    [facility] [address], [place], [place]  [address] Phone: [phone]; Fax: [phone]

## 2015-04-11 NOTE — Telephone Encounter (Signed)
Last sen 01/06/15 and filled 03/13/15 #45.   Please advise    KP

## 2015-04-11 NOTE — Patient Instructions (Signed)

## 2015-04-16 ENCOUNTER — Other Ambulatory Visit (HOSPITAL_BASED_OUTPATIENT_CLINIC_OR_DEPARTMENT_OTHER): Payer: Medicare Other

## 2015-04-16 ENCOUNTER — Other Ambulatory Visit: Payer: Self-pay | Admitting: *Deleted

## 2015-04-16 ENCOUNTER — Ambulatory Visit (HOSPITAL_BASED_OUTPATIENT_CLINIC_OR_DEPARTMENT_OTHER): Payer: Medicare Other

## 2015-04-16 VITALS — BP 102/59 | HR 42 | Temp 97.9°F

## 2015-04-16 DIAGNOSIS — C50911 Malignant neoplasm of unspecified site of right female breast: Secondary | ICD-10-CM

## 2015-04-16 DIAGNOSIS — Z5111 Encounter for antineoplastic chemotherapy: Secondary | ICD-10-CM

## 2015-04-16 DIAGNOSIS — M810 Age-related osteoporosis without current pathological fracture: Secondary | ICD-10-CM

## 2015-04-16 DIAGNOSIS — G8929 Other chronic pain: Secondary | ICD-10-CM

## 2015-04-16 DIAGNOSIS — D0512 Intraductal carcinoma in situ of left breast: Secondary | ICD-10-CM

## 2015-04-16 LAB — CBC WITH DIFFERENTIAL/PLATELET
BASO%: 0.3 % (ref 0.0–2.0)
Basophils Absolute: 0 10*3/uL (ref 0.0–0.1)
EOS%: 0.7 % (ref 0.0–7.0)
Eosinophils Absolute: 0.1 10*3/uL (ref 0.0–0.5)
HCT: 42.8 % (ref 34.8–46.6)
HGB: 14.2 g/dL (ref 11.6–15.9)
LYMPH%: 33.4 % (ref 14.0–49.7)
MCH: 30.6 pg (ref 25.1–34.0)
MCHC: 33.2 g/dL (ref 31.5–36.0)
MCV: 92.2 fL (ref 79.5–101.0)
MONO#: 0.7 10*3/uL (ref 0.1–0.9)
MONO%: 8.8 % (ref 0.0–14.0)
NEUT#: 4.3 10*3/uL (ref 1.5–6.5)
NEUT%: 56.8 % (ref 38.4–76.8)
Platelets: 233 10*3/uL (ref 145–400)
RBC: 4.64 10*6/uL (ref 3.70–5.45)
RDW: 13.3 % (ref 11.2–14.5)
WBC: 7.5 10*3/uL (ref 3.9–10.3)
lymph#: 2.5 10*3/uL (ref 0.9–3.3)

## 2015-04-16 LAB — COMPREHENSIVE METABOLIC PANEL (CC13)
ALT: 12 U/L (ref 0–55)
AST: 15 U/L (ref 5–34)
Albumin: 4.1 g/dL (ref 3.5–5.0)
Alkaline Phosphatase: 62 U/L (ref 40–150)
Anion Gap: 7 mEq/L (ref 3–11)
BILIRUBIN TOTAL: 0.47 mg/dL (ref 0.20–1.20)
BUN: 9.1 mg/dL (ref 7.0–26.0)
CHLORIDE: 102 meq/L (ref 98–109)
CO2: 30 mEq/L — ABNORMAL HIGH (ref 22–29)
CREATININE: 0.8 mg/dL (ref 0.6–1.1)
Calcium: 9.7 mg/dL (ref 8.4–10.4)
EGFR: 71 mL/min/{1.73_m2} — AB (ref >90)
GLUCOSE: 87 mg/dL (ref 70–140)
POTASSIUM: 4.1 meq/L (ref 3.5–5.1)
SODIUM: 138 meq/L (ref 136–145)
Total Protein: 6.9 g/dL (ref 6.4–8.3)

## 2015-04-16 MED ORDER — FULVESTRANT 250 MG/5ML IM SOLN
500.0000 mg | Freq: Once | INTRAMUSCULAR | Status: AC
  Administered 2015-04-16: 500 mg via INTRAMUSCULAR
  Filled 2015-04-16: qty 10

## 2015-04-16 MED ORDER — OXYMORPHONE HCL ER 5 MG PO TB12: 5.0000 mg | ORAL_TABLET | Freq: Two times a day (BID) | ORAL | Status: DC

## 2015-04-23 ENCOUNTER — Telehealth: Payer: Self-pay

## 2015-04-23 NOTE — Telephone Encounter (Signed)
Pre visit call completed 

## 2015-04-25 ENCOUNTER — Encounter: Payer: Self-pay | Admitting: Family Medicine

## 2015-04-25 ENCOUNTER — Ambulatory Visit (INDEPENDENT_AMBULATORY_CARE_PROVIDER_SITE_OTHER): Payer: Medicare Other | Admitting: Family Medicine

## 2015-04-25 VITALS — BP 110/68 | HR 64 | Temp 97.6°F | Ht 62.0 in | Wt 126.6 lb

## 2015-04-25 DIAGNOSIS — C50911 Malignant neoplasm of unspecified site of right female breast: Secondary | ICD-10-CM | POA: Diagnosis not present

## 2015-04-25 DIAGNOSIS — I10 Essential (primary) hypertension: Secondary | ICD-10-CM

## 2015-04-25 DIAGNOSIS — E559 Vitamin D deficiency, unspecified: Secondary | ICD-10-CM

## 2015-04-25 DIAGNOSIS — Z0000 Encounter for general adult medical examination without abnormal findings: Secondary | ICD-10-CM

## 2015-04-25 DIAGNOSIS — G894 Chronic pain syndrome: Secondary | ICD-10-CM

## 2015-04-25 DIAGNOSIS — J4520 Mild intermittent asthma, uncomplicated: Secondary | ICD-10-CM

## 2015-04-25 DIAGNOSIS — F329 Major depressive disorder, single episode, unspecified: Secondary | ICD-10-CM

## 2015-04-25 DIAGNOSIS — F32A Depression, unspecified: Secondary | ICD-10-CM

## 2015-04-25 DIAGNOSIS — E785 Hyperlipidemia, unspecified: Secondary | ICD-10-CM

## 2015-04-25 DIAGNOSIS — I2510 Atherosclerotic heart disease of native coronary artery without angina pectoris: Secondary | ICD-10-CM

## 2015-04-25 LAB — HEPATIC FUNCTION PANEL
ALK PHOS: 51 U/L (ref 39–117)
ALT: 11 U/L (ref 0–35)
AST: 16 U/L (ref 0–37)
Albumin: 4.1 g/dL (ref 3.5–5.2)
Bilirubin, Direct: 0.1 mg/dL (ref 0.0–0.3)
TOTAL PROTEIN: 6.8 g/dL (ref 6.0–8.3)
Total Bilirubin: 0.5 mg/dL (ref 0.2–1.2)

## 2015-04-25 LAB — CBC WITH DIFFERENTIAL/PLATELET
BASOS PCT: 0.6 % (ref 0.0–3.0)
Basophils Absolute: 0 10*3/uL (ref 0.0–0.1)
Eosinophils Absolute: 0.1 10*3/uL (ref 0.0–0.7)
Eosinophils Relative: 1.3 % (ref 0.0–5.0)
HCT: 39.5 % (ref 36.0–46.0)
Hemoglobin: 13 g/dL (ref 12.0–15.0)
Lymphocytes Relative: 34 % (ref 12.0–46.0)
Lymphs Abs: 1.6 10*3/uL (ref 0.7–4.0)
MCHC: 32.9 g/dL (ref 30.0–36.0)
MCV: 91.9 fl (ref 78.0–100.0)
Monocytes Absolute: 0.3 10*3/uL (ref 0.1–1.0)
Monocytes Relative: 6 % (ref 3.0–12.0)
Neutro Abs: 2.8 10*3/uL (ref 1.4–7.7)
Neutrophils Relative %: 58.1 % (ref 43.0–77.0)
Platelets: 210 10*3/uL (ref 150.0–400.0)
RBC: 4.29 Mil/uL (ref 3.87–5.11)
RDW: 13.9 % (ref 11.5–15.5)
WBC: 4.8 10*3/uL (ref 4.0–10.5)

## 2015-04-25 LAB — LIPID PANEL
Cholesterol: 199 mg/dL (ref 0–200)
HDL: 58.6 mg/dL (ref >39.00)
LDL CALC: 121 mg/dL — AB (ref 0–99)
NonHDL: 139.95
TRIGLYCERIDES: 96 mg/dL (ref 0.0–149.0)
Total CHOL/HDL Ratio: 3
VLDL: 19.2 mg/dL (ref 0.0–40.0)

## 2015-04-25 LAB — POCT URINALYSIS DIPSTICK
BILIRUBIN UA: NEGATIVE
Glucose, UA: NEGATIVE
Ketones, UA: NEGATIVE
Leukocytes, UA: NEGATIVE
NITRITE UA: NEGATIVE
PH UA: 6
Protein, UA: NEGATIVE
RBC UA: NEGATIVE
Spec Grav, UA: 1.015
UROBILINOGEN UA: 0.2

## 2015-04-25 LAB — BASIC METABOLIC PANEL
BUN: 11 mg/dL (ref 6–23)
CO2: 31 mEq/L (ref 19–32)
Calcium: 9.3 mg/dL (ref 8.4–10.5)
Chloride: 100 mEq/L (ref 96–112)
Creatinine, Ser: 0.7 mg/dL (ref 0.40–1.20)
GFR: 85.42 mL/min (ref >60.00)
Glucose, Bld: 80 mg/dL (ref 70–99)
POTASSIUM: 3.9 meq/L (ref 3.5–5.1)
Sodium: 136 mEq/L (ref 135–145)

## 2015-04-25 MED ORDER — EZETIMIBE 10 MG PO TABS: 10.0000 mg | ORAL_TABLET | Freq: Every day | ORAL | Status: DC

## 2015-04-25 MED ORDER — CITALOPRAM HYDROBROMIDE 20 MG PO TABS: 20.0000 mg | ORAL_TABLET | Freq: Every day | ORAL | Status: DC

## 2015-04-25 MED ORDER — MONTELUKAST SODIUM 10 MG PO TABS: 10.0000 mg | ORAL_TABLET | Freq: Every day | ORAL | Status: DC

## 2015-04-25 MED ORDER — DULOXETINE HCL 60 MG PO CPEP: ORAL_CAPSULE | ORAL | Status: DC

## 2015-04-25 MED ORDER — CELECOXIB 200 MG PO CAPS: ORAL_CAPSULE | ORAL | Status: DC

## 2015-04-25 MED ORDER — DIAZEPAM 5 MG PO TABS: ORAL_TABLET | ORAL | Status: DC

## 2015-04-25 MED ORDER — NITROGLYCERIN 0.4 MG SL SUBL: 0.4000 mg | SUBLINGUAL_TABLET | SUBLINGUAL | Status: DC | PRN

## 2015-04-25 MED ORDER — ISOSORBIDE MONONITRATE ER 30 MG PO TB24: 30.0000 mg | ORAL_TABLET | Freq: Every day | ORAL | Status: DC

## 2015-04-25 MED ORDER — ALBUTEROL SULFATE HFA 108 (90 BASE) MCG/ACT IN AERS: 2.0000 | INHALATION_SPRAY | Freq: Four times a day (QID) | RESPIRATORY_TRACT | Status: DC | PRN

## 2015-04-25 MED ORDER — BUDESONIDE-FORMOTEROL FUMARATE 160-4.5 MCG/ACT IN AERO: 2.0000 | INHALATION_SPRAY | Freq: Two times a day (BID) | RESPIRATORY_TRACT | Status: DC | PRN

## 2015-04-25 NOTE — Patient Instructions (Signed)
Preventive Care for Adults A healthy lifestyle and preventive care can promote health and wellness. Preventive health guidelines for women include the following key practices.  A routine yearly physical is a good way to check with your health care provider about your health and preventive screening. It is a chance to share any concerns and updates on your health and to receive a thorough exam.  Visit your dentist for a routine exam and preventive care every 6 months. Brush your teeth twice a day and floss once a day. Good oral hygiene prevents tooth decay and gum disease.  The frequency of eye exams is based on your age, health, family medical history, use of contact lenses, and other factors. Follow your health care provider's recommendations for frequency of eye exams.  Eat a healthy diet. Foods like vegetables, fruits, whole grains, low-fat dairy products, and lean protein foods contain the nutrients you need without too many calories. Decrease your intake of foods high in solid fats, added sugars, and salt. Eat the right amount of calories for you.Get information about a proper diet from your health care provider, if necessary.  Regular physical exercise is one of the most important things you can do for your health. Most adults should get at least 150 minutes of moderate-intensity exercise (any activity that increases your heart rate and causes you to sweat) each week. In addition, most adults need muscle-strengthening exercises on 2 or more days a week.  Maintain a healthy weight. The body mass index (BMI) is a screening tool to identify possible weight problems. It provides an estimate of body fat based on height and weight. Your health care provider can find your BMI and can help you achieve or maintain a healthy weight.For adults 20 years and older:  A BMI below 18.5 is considered underweight.  A BMI of 18.5 to 24.9 is normal.  A BMI of 25 to 29.9 is considered overweight.  A BMI of  30 and above is considered obese.  Maintain normal blood lipids and cholesterol levels by exercising and minimizing your intake of saturated fat. Eat a balanced diet with plenty of fruit and vegetables. Blood tests for lipids and cholesterol should begin at age 76 and be repeated every 5 years. If your lipid or cholesterol levels are high, you are over 50, or you are at high risk for heart disease, you may need your cholesterol levels checked more frequently.Ongoing high lipid and cholesterol levels should be treated with medicines if diet and exercise are not working.  If you smoke, find out from your health care provider how to quit. If you do not use tobacco, do not start.  Lung cancer screening is recommended for adults aged 22-80 years who are at high risk for developing lung cancer because of a history of smoking. A yearly low-dose CT scan of the lungs is recommended for people who have at least a 30-pack-year history of smoking and are a current smoker or have quit within the past 15 years. A pack year of smoking is smoking an average of 1 pack of cigarettes a day for 1 year (for example: 1 pack a day for 30 years or 2 packs a day for 15 years). Yearly screening should continue until the smoker has stopped smoking for at least 15 years. Yearly screening should be stopped for people who develop a health problem that would prevent them from having lung cancer treatment.  If you are pregnant, do not drink alcohol. If you are breastfeeding,  be very cautious about drinking alcohol. If you are not pregnant and choose to drink alcohol, do not have more than 1 drink per day. One drink is considered to be 12 ounces (355 mL) of beer, 5 ounces (148 mL) of wine, or 1.5 ounces (44 mL) of liquor.  Avoid use of street drugs. Do not share needles with anyone. Ask for help if you need support or instructions about stopping the use of drugs.  High blood pressure causes heart disease and increases the risk of  stroke. Your blood pressure should be checked at least every 1 to 2 years. Ongoing high blood pressure should be treated with medicines if weight loss and exercise do not work.  If you are 75-52 years old, ask your health care provider if you should take aspirin to prevent strokes.  Diabetes screening involves taking a blood sample to check your fasting blood sugar level. This should be done once every 3 years, after age 15, if you are within normal weight and without risk factors for diabetes. Testing should be considered at a younger age or be carried out more frequently if you are overweight and have at least 1 risk factor for diabetes.  Breast cancer screening is essential preventive care for women. You should practice "breast self-awareness." This means understanding the normal appearance and feel of your breasts and may include breast self-examination. Any changes detected, no matter how small, should be reported to a health care provider. Women in their 58s and 30s should have a clinical breast exam (CBE) by a health care provider as part of a regular health exam every 1 to 3 years. After age 16, women should have a CBE every year. Starting at age 53, women should consider having a mammogram (breast X-ray test) every year. Women who have a family history of breast cancer should talk to their health care provider about genetic screening. Women at a high risk of breast cancer should talk to their health care providers about having an MRI and a mammogram every year.  Breast cancer gene (BRCA)-related cancer risk assessment is recommended for women who have family members with BRCA-related cancers. BRCA-related cancers include breast, ovarian, tubal, and peritoneal cancers. Having family members with these cancers may be associated with an increased risk for harmful changes (mutations) in the breast cancer genes BRCA1 and BRCA2. Results of the assessment will determine the need for genetic counseling and  BRCA1 and BRCA2 testing.  Routine pelvic exams to screen for cancer are no longer recommended for nonpregnant women who are considered low risk for cancer of the pelvic organs (ovaries, uterus, and vagina) and who do not have symptoms. Ask your health care provider if a screening pelvic exam is right for you.  If you have had past treatment for cervical cancer or a condition that could lead to cancer, you need Pap tests and screening for cancer for at least 20 years after your treatment. If Pap tests have been discontinued, your risk factors (such as having a new sexual partner) need to be reassessed to determine if screening should be resumed. Some women have medical problems that increase the chance of getting cervical cancer. In these cases, your health care provider may recommend more frequent screening and Pap tests.  The HPV test is an additional test that may be used for cervical cancer screening. The HPV test looks for the virus that can cause the cell changes on the cervix. The cells collected during the Pap test can be  tested for HPV. The HPV test could be used to screen women aged 30 years and older, and should be used in women of any age who have unclear Pap test results. After the age of 30, women should have HPV testing at the same frequency as a Pap test.  Colorectal cancer can be detected and often prevented. Most routine colorectal cancer screening begins at the age of 50 years and continues through age 75 years. However, your health care provider may recommend screening at an earlier age if you have risk factors for colon cancer. On a yearly basis, your health care provider may provide home test kits to check for hidden blood in the stool. Use of a small camera at the end of a tube, to directly examine the colon (sigmoidoscopy or colonoscopy), can detect the earliest forms of colorectal cancer. Talk to your health care provider about this at age 50, when routine screening begins. Direct  exam of the colon should be repeated every 5-10 years through age 75 years, unless early forms of pre-cancerous polyps or small growths are found.  People who are at an increased risk for hepatitis B should be screened for this virus. You are considered at high risk for hepatitis B if:  You were born in a country where hepatitis B occurs often. Talk with your health care provider about which countries are considered high risk.  Your parents were born in a high-risk country and you have not received a shot to protect against hepatitis B (hepatitis B vaccine).  You have HIV or AIDS.  You use needles to inject street drugs.  You live with, or have sex with, someone who has hepatitis B.  You get hemodialysis treatment.  You take certain medicines for conditions like cancer, organ transplantation, and autoimmune conditions.  Hepatitis C blood testing is recommended for all people born from 1945 through 1965 and any individual with known risks for hepatitis C.  Practice safe sex. Use condoms and avoid high-risk sexual practices to reduce the spread of sexually transmitted infections (STIs). STIs include gonorrhea, chlamydia, syphilis, trichomonas, herpes, HPV, and human immunodeficiency virus (HIV). Herpes, HIV, and HPV are viral illnesses that have no cure. They can result in disability, cancer, and death.  You should be screened for sexually transmitted illnesses (STIs) including gonorrhea and chlamydia if:  You are sexually active and are younger than 24 years.  You are older than 24 years and your health care provider tells you that you are at risk for this type of infection.  Your sexual activity has changed since you were last screened and you are at an increased risk for chlamydia or gonorrhea. Ask your health care provider if you are at risk.  If you are at risk of being infected with HIV, it is recommended that you take a prescription medicine daily to prevent HIV infection. This is  called preexposure prophylaxis (PrEP). You are considered at risk if:  You are a heterosexual woman, are sexually active, and are at increased risk for HIV infection.  You take drugs by injection.  You are sexually active with a partner who has HIV.  Talk with your health care provider about whether you are at high risk of being infected with HIV. If you choose to begin PrEP, you should first be tested for HIV. You should then be tested every 3 months for as long as you are taking PrEP.  Osteoporosis is a disease in which the bones lose minerals and strength   with aging. This can result in serious bone fractures or breaks. The risk of osteoporosis can be identified using a bone density scan. Women ages 65 years and over and women at risk for fractures or osteoporosis should discuss screening with their health care providers. Ask your health care provider whether you should take a calcium supplement or vitamin D to reduce the rate of osteoporosis.  Menopause can be associated with physical symptoms and risks. Hormone replacement therapy is available to decrease symptoms and risks. You should talk to your health care provider about whether hormone replacement therapy is right for you.  Use sunscreen. Apply sunscreen liberally and repeatedly throughout the day. You should seek shade when your shadow is shorter than you. Protect yourself by wearing long sleeves, pants, a wide-brimmed hat, and sunglasses year round, whenever you are outdoors.  Once a month, do a whole body skin exam, using a mirror to look at the skin on your back. Tell your health care provider of new moles, moles that have irregular borders, moles that are larger than a pencil eraser, or moles that have changed in shape or color.  Stay current with required vaccines (immunizations).  Influenza vaccine. All adults should be immunized every year.  Tetanus, diphtheria, and acellular pertussis (Td, Tdap) vaccine. Pregnant women should  receive 1 dose of Tdap vaccine during each pregnancy. The dose should be obtained regardless of the length of time since the last dose. Immunization is preferred during the 27th-36th week of gestation. An adult who has not previously received Tdap or who does not know her vaccine status should receive 1 dose of Tdap. This initial dose should be followed by tetanus and diphtheria toxoids (Td) booster doses every 10 years. Adults with an unknown or incomplete history of completing a 3-dose immunization series with Td-containing vaccines should begin or complete a primary immunization series including a Tdap dose. Adults should receive a Td booster every 10 years.  Varicella vaccine. An adult without evidence of immunity to varicella should receive 2 doses or a second dose if she has previously received 1 dose. Pregnant females who do not have evidence of immunity should receive the first dose after pregnancy. This first dose should be obtained before leaving the health care facility. The second dose should be obtained 4-8 weeks after the first dose.  Human papillomavirus (HPV) vaccine. Females aged 13-26 years who have not received the vaccine previously should obtain the 3-dose series. The vaccine is not recommended for use in pregnant females. However, pregnancy testing is not needed before receiving a dose. If a female is found to be pregnant after receiving a dose, no treatment is needed. In that case, the remaining doses should be delayed until after the pregnancy. Immunization is recommended for any person with an immunocompromised condition through the age of 26 years if she did not get any or all doses earlier. During the 3-dose series, the second dose should be obtained 4-8 weeks after the first dose. The third dose should be obtained 24 weeks after the first dose and 16 weeks after the second dose.  Zoster vaccine. One dose is recommended for adults aged 60 years or older unless certain conditions are  present.  Measles, mumps, and rubella (MMR) vaccine. Adults born before 1957 generally are considered immune to measles and mumps. Adults born in 1957 or later should have 1 or more doses of MMR vaccine unless there is a contraindication to the vaccine or there is laboratory evidence of immunity to   each of the three diseases. A routine second dose of MMR vaccine should be obtained at least 28 days after the first dose for students attending postsecondary schools, health care workers, or international travelers. People who received inactivated measles vaccine or an unknown type of measles vaccine during 1963-1967 should receive 2 doses of MMR vaccine. People who received inactivated mumps vaccine or an unknown type of mumps vaccine before 1979 and are at high risk for mumps infection should consider immunization with 2 doses of MMR vaccine. For females of childbearing age, rubella immunity should be determined. If there is no evidence of immunity, females who are not pregnant should be vaccinated. If there is no evidence of immunity, females who are pregnant should delay immunization until after pregnancy. Unvaccinated health care workers born before 1957 who lack laboratory evidence of measles, mumps, or rubella immunity or laboratory confirmation of disease should consider measles and mumps immunization with 2 doses of MMR vaccine or rubella immunization with 1 dose of MMR vaccine.  Pneumococcal 13-valent conjugate (PCV13) vaccine. When indicated, a person who is uncertain of her immunization history and has no record of immunization should receive the PCV13 vaccine. An adult aged 19 years or older who has certain medical conditions and has not been previously immunized should receive 1 dose of PCV13 vaccine. This PCV13 should be followed with a dose of pneumococcal polysaccharide (PPSV23) vaccine. The PPSV23 vaccine dose should be obtained at least 8 weeks after the dose of PCV13 vaccine. An adult aged 19  years or older who has certain medical conditions and previously received 1 or more doses of PPSV23 vaccine should receive 1 dose of PCV13. The PCV13 vaccine dose should be obtained 1 or more years after the last PPSV23 vaccine dose.  Pneumococcal polysaccharide (PPSV23) vaccine. When PCV13 is also indicated, PCV13 should be obtained first. All adults aged 65 years and older should be immunized. An adult younger than age 65 years who has certain medical conditions should be immunized. Any person who resides in a nursing home or long-term care facility should be immunized. An adult smoker should be immunized. People with an immunocompromised condition and certain other conditions should receive both PCV13 and PPSV23 vaccines. People with human immunodeficiency virus (HIV) infection should be immunized as soon as possible after diagnosis. Immunization during chemotherapy or radiation therapy should be avoided. Routine use of PPSV23 vaccine is not recommended for American Indians, [place] Natives, or people younger than 65 years unless there are medical conditions that require PPSV23 vaccine. When indicated, people who have unknown immunization and have no record of immunization should receive PPSV23 vaccine. One-time revaccination 5 years after the first dose of PPSV23 is recommended for people aged 19-64 years who have chronic kidney failure, nephrotic syndrome, asplenia, or immunocompromised conditions. People who received 1-2 doses of PPSV23 before age 65 years should receive another dose of PPSV23 vaccine at age 65 years or later if at least 5 years have passed since the previous dose. Doses of PPSV23 are not needed for people immunized with PPSV23 at or after age 65 years.  Meningococcal vaccine. Adults with asplenia or persistent complement component deficiencies should receive 2 doses of quadrivalent meningococcal conjugate (MenACWY-D) vaccine. The doses should be obtained at least 2 months apart.  Microbiologists working with certain meningococcal bacteria, military recruits, people at risk during an outbreak, and people who travel to or live in countries with a high rate of meningitis should be immunized. A first-year college student up through age   21 years who is living in a residence hall should receive a dose if she did not receive a dose on or after her 16th birthday. Adults who have certain high-risk conditions should receive one or more doses of vaccine.  Hepatitis A vaccine. Adults who wish to be protected from this disease, have certain high-risk conditions, work with hepatitis A-infected animals, work in hepatitis A research labs, or travel to or work in countries with a high rate of hepatitis A should be immunized. Adults who were previously unvaccinated and who anticipate close contact with an international adoptee during the first 60 days after arrival in the [place] from a country with a high rate of hepatitis A should be immunized.  Hepatitis B vaccine. Adults who wish to be protected from this disease, have certain high-risk conditions, may be exposed to blood or other infectious body fluids, are household contacts or sex partners of hepatitis B positive people, are clients or workers in certain care facilities, or travel to or work in countries with a high rate of hepatitis B should be immunized.  Haemophilus influenzae type b (Hib) vaccine. A previously unvaccinated person with asplenia or sickle cell disease or having a scheduled splenectomy should receive 1 dose of Hib vaccine. Regardless of previous immunization, a recipient of a hematopoietic stem cell transplant should receive a 3-dose series 6-12 months after her successful transplant. Hib vaccine is not recommended for adults with HIV infection. Preventive Services / Frequency Ages 64 to 68 years  Blood pressure check.** / Every 1 to 2 years.  Lipid and cholesterol check.** / Every 5 years beginning at age  22.  Clinical breast exam.** / Every 3 years for women in their 88s and 53s.  BRCA-related cancer risk assessment.** / For women who have family members with a BRCA-related cancer (breast, ovarian, tubal, or peritoneal cancers).  Pap test.** / Every 2 years from ages 90 through 51. Every 3 years starting at age 21 through age 56 or 3 with a history of 3 consecutive normal Pap tests.  HPV screening.** / Every 3 years from ages 24 through ages 1 to 46 with a history of 3 consecutive normal Pap tests.  Hepatitis C blood test.** / For any individual with known risks for hepatitis C.  Skin self-exam. / Monthly.  Influenza vaccine. / Every year.  Tetanus, diphtheria, and acellular pertussis (Tdap, Td) vaccine.** / Consult your health care provider. Pregnant women should receive 1 dose of Tdap vaccine during each pregnancy. 1 dose of Td every 10 years.  Varicella vaccine.** / Consult your health care provider. Pregnant females who do not have evidence of immunity should receive the first dose after pregnancy.  HPV vaccine. / 3 doses over 6 months, if 72 and younger. The vaccine is not recommended for use in pregnant females. However, pregnancy testing is not needed before receiving a dose.  Measles, mumps, rubella (MMR) vaccine.** / You need at least 1 dose of MMR if you were born in 1957 or later. You may also need a 2nd dose. For females of childbearing age, rubella immunity should be determined. If there is no evidence of immunity, females who are not pregnant should be vaccinated. If there is no evidence of immunity, females who are pregnant should delay immunization until after pregnancy.  Pneumococcal 13-valent conjugate (PCV13) vaccine.** / Consult your health care provider.  Pneumococcal polysaccharide (PPSV23) vaccine.** / 1 to 2 doses if you smoke cigarettes or if you have certain conditions.  Meningococcal vaccine.** /  1 dose if you are age 19 to 21 years and a first-year college  student living in a residence hall, or have one of several medical conditions, you need to get vaccinated against meningococcal disease. You may also need additional booster doses.  Hepatitis A vaccine.** / Consult your health care provider.  Hepatitis B vaccine.** / Consult your health care provider.  Haemophilus influenzae type b (Hib) vaccine.** / Consult your health care provider. Ages 40 to 64 years  Blood pressure check.** / Every 1 to 2 years.  Lipid and cholesterol check.** / Every 5 years beginning at age 20 years.  Lung cancer screening. / Every year if you are aged 55-80 years and have a 30-pack-year history of smoking and currently smoke or have quit within the past 15 years. Yearly screening is stopped once you have quit smoking for at least 15 years or develop a health problem that would prevent you from having lung cancer treatment.  Clinical breast exam.** / Every year after age 40 years.  BRCA-related cancer risk assessment.** / For women who have family members with a BRCA-related cancer (breast, ovarian, tubal, or peritoneal cancers).  Mammogram.** / Every year beginning at age 40 years and continuing for as long as you are in good health. Consult with your health care provider.  Pap test.** / Every 3 years starting at age 30 years through age 65 or 70 years with a history of 3 consecutive normal Pap tests.  HPV screening.** / Every 3 years from ages 30 years through ages 65 to 70 years with a history of 3 consecutive normal Pap tests.  Fecal occult blood test (FOBT) of stool. / Every year beginning at age 50 years and continuing until age 75 years. You may not need to do this test if you get a colonoscopy every 10 years.  Flexible sigmoidoscopy or colonoscopy.** / Every 5 years for a flexible sigmoidoscopy or every 10 years for a colonoscopy beginning at age 50 years and continuing until age 75 years.  Hepatitis C blood test.** / For all people born from 1945 through  1965 and any individual with known risks for hepatitis C.  Skin self-exam. / Monthly.  Influenza vaccine. / Every year.  Tetanus, diphtheria, and acellular pertussis (Tdap/Td) vaccine.** / Consult your health care provider. Pregnant women should receive 1 dose of Tdap vaccine during each pregnancy. 1 dose of Td every 10 years.  Varicella vaccine.** / Consult your health care provider. Pregnant females who do not have evidence of immunity should receive the first dose after pregnancy.  Zoster vaccine.** / 1 dose for adults aged 60 years or older.  Measles, mumps, rubella (MMR) vaccine.** / You need at least 1 dose of MMR if you were born in 1957 or later. You may also need a 2nd dose. For females of childbearing age, rubella immunity should be determined. If there is no evidence of immunity, females who are not pregnant should be vaccinated. If there is no evidence of immunity, females who are pregnant should delay immunization until after pregnancy.  Pneumococcal 13-valent conjugate (PCV13) vaccine.** / Consult your health care provider.  Pneumococcal polysaccharide (PPSV23) vaccine.** / 1 to 2 doses if you smoke cigarettes or if you have certain conditions.  Meningococcal vaccine.** / Consult your health care provider.  Hepatitis A vaccine.** / Consult your health care provider.  Hepatitis B vaccine.** / Consult your health care provider.  Haemophilus influenzae type b (Hib) vaccine.** / Consult your health care provider. Ages 65   years and over  Blood pressure check.** / Every 1 to 2 years.  Lipid and cholesterol check.** / Every 5 years beginning at age 22 years.  Lung cancer screening. / Every year if you are aged 73-80 years and have a 30-pack-year history of smoking and currently smoke or have quit within the past 15 years. Yearly screening is stopped once you have quit smoking for at least 15 years or develop a health problem that would prevent you from having lung cancer  treatment.  Clinical breast exam.** / Every year after age 4 years.  BRCA-related cancer risk assessment.** / For women who have family members with a BRCA-related cancer (breast, ovarian, tubal, or peritoneal cancers).  Mammogram.** / Every year beginning at age 40 years and continuing for as long as you are in good health. Consult with your health care provider.  Pap test.** / Every 3 years starting at age 9 years through age 34 or 91 years with 3 consecutive normal Pap tests. Testing can be stopped between 65 and 70 years with 3 consecutive normal Pap tests and no abnormal Pap or HPV tests in the past 10 years.  HPV screening.** / Every 3 years from ages 57 years through ages 64 or 45 years with a history of 3 consecutive normal Pap tests. Testing can be stopped between 65 and 70 years with 3 consecutive normal Pap tests and no abnormal Pap or HPV tests in the past 10 years.  Fecal occult blood test (FOBT) of stool. / Every year beginning at age 15 years and continuing until age 17 years. You may not need to do this test if you get a colonoscopy every 10 years.  Flexible sigmoidoscopy or colonoscopy.** / Every 5 years for a flexible sigmoidoscopy or every 10 years for a colonoscopy beginning at age 86 years and continuing until age 71 years.  Hepatitis C blood test.** / For all people born from [birth year] through 1963 and any individual with known risks for hepatitis C.  Osteoporosis screening.** / A one-time screening for women ages 83 years and over and women at risk for fractures or osteoporosis.  Skin self-exam. / Monthly.  Influenza vaccine. / Every year.  Tetanus, diphtheria, and acellular pertussis (Tdap/Td) vaccine.** / 1 dose of Td every 10 years.  Varicella vaccine.** / Consult your health care provider.  Zoster vaccine.** / 1 dose for adults aged 61 years or older.  Pneumococcal 13-valent conjugate (PCV13) vaccine.** / Consult your health care provider.  Pneumococcal  polysaccharide (PPSV23) vaccine.** / 1 dose for all adults aged 28 years and older.  Meningococcal vaccine.** / Consult your health care provider.  Hepatitis A vaccine.** / Consult your health care provider.  Hepatitis B vaccine.** / Consult your health care provider.  Haemophilus influenzae type b (Hib) vaccine.** / Consult your health care provider. ** Family history and personal history of risk and conditions may change your health care provider's recommendations. Document Released: 11/09/2001 Document Revised: 01/28/2014 Document Reviewed: 02/08/2011 [facility] Patient Information 2015 [place], [place]. This information is not intended to replace advice given to you by your health care provider. Make sure you discuss any questions you have with your health care provider.

## 2015-04-25 NOTE — Progress Notes (Signed)
Subjective:   Angel [name] is a 81 y.o. female who presents for Medicare Annual (Subsequent) preventive examination. She is also her for labs and for refills on meds.   Review of Systems:   Review of Systems  Constitutional: Negative for activity change, appetite change and fatigue.  HENT: Negative for hearing loss, congestion, tinnitus and ear discharge.   Eyes: Negative for visual disturbance (see optho q1y -- vision corrected to 20/20 with glasses).  Respiratory: Negative for cough, chest tightness and shortness of breath.   Cardiovascular: Negative for chest pain, palpitations and leg swelling.  Gastrointestinal: Negative for abdominal pain, diarrhea, constipation and abdominal distention.  Genitourinary: Negative for urgency, frequency, decreased urine volume and difficulty urinating.  Musculoskeletal: Negative for back pain, arthralgias and gait problem.  Skin: Negative for color change, pallor and rash.  Neurological: Negative for dizziness, light-headedness, numbness and headaches.  Hematological: Negative for adenopathy. Does not bruise/bleed easily.  Psychiatric/Behavioral: Negative for suicidal ideas, confusion, sleep disturbance, self-injury, dysphoric mood, decreased concentration and agitation.  Pt is able to read and write and can do all ADLs No risk for falling No abuse/ violence in home           Objective:     Vitals: BP 110/68 mmHg  Pulse 64  Temp(Src) 97.6 F (36.4 C) (Oral)  Ht 5\' 2"  (1.575 m)  Wt 126 lb 9.6 oz (57.425 kg)  BMI 23.15 kg/m2  SpO2 98% BP 110/68 mmHg  Pulse 64  Temp(Src) 97.6 F (36.4 C) (Oral)  Ht 5\' 2"  (1.575 m)  Wt 126 lb 9.6 oz (57.425 kg)  BMI 23.15 kg/m2  SpO2 98% General appearance: alert, cooperative, appears stated age and no distress Head: Normocephalic, without obvious abnormality, atraumatic Eyes:  Ears: normal TM's and external ear canals both ears Nose: Nares normal. Septum midline. Mucosa normal. No  drainage or sinus tenderness. Throat: lips, mucosa, and tongue normal; teeth and gums normal Neck: no adenopathy, no carotid bruit, no JVD, supple, symmetrical, trachea midline and thyroid not enlarged, symmetric, no tenderness/mass/nodules Back: symmetric, no curvature. ROM normal. No CVA tenderness. Lungs: clear to auscultation bilaterally Breasts: normal appearance, no masses or tenderness Heart: regular rate and rhythm, S1, S2 normal, no murmur, click, rub or gallop Abdomen: soft, non-tender; bowel sounds normal; no masses,  no organomegaly Pelvic: deferred Extremities: extremities normal, atraumatic, no cyanosis or edema Pulses: 2+ and symmetric Skin: Skin color, texture, turgor normal. No rashes or lesions Lymph nodes: Cervical, supraclavicular, and axillary nodes normal. Neurologic: Alert and oriented X 3, normal strength and tone. Normal symmetric reflexes. Normal coordination and gait Psych- no depression, no anxiety Tobacco History  Smoking status  . Never Smoker   Smokeless tobacco  . Never Used     Counseling given: Not Answered   Past Medical History  Diagnosis Date  . Asthma   . Spinal stenosis   . Osteoporosis   . Chronic bronchitis   . DDD (degenerative disc disease)   . Osteoarthritis   . Degenerative joint disease   . Coronary artery disease Non-obstructive    a. 2012 Cath: LAD 40-11m->Med Rx;  b. 11/2013 Echo: EF 50-55%, nl wall motion;  c. 12/2013 Lexi MV: EF 74%, no ischemia.  . Depression   . Anxiety   . COPD (chronic obstructive pulmonary disease)   . Dyslipidemia   . Cholelithiasis   . GERD (gastroesophageal reflux disease)   . Hx of radiation therapy 07/02/1999- 08/11/1999    right supraclavucular/axillary region: 5040  cGy, 28 fractions  . Hx of radiation therapy 02/20/14- 03/27/14    right chest wall 4500 cGy 25 sessions  . Hypertension   . Kidney stones     one stones  . Cataract     eye surgery planned for 11/2014  . PVC's (premature ventricular  contractions)   . Reflux esophagitis   . Pre-diabetes   . Skin cancer of face     non melanoma  . Breast cancer 05/07/1999, 06/2011    a. s/p bilat mastectomies. recurrence 01/2014. Radiation, meds.  . Chest wall recurrence of breast cancer 2016  . Cancer of chest (wall)    Past Surgical History  Procedure Laterality Date  . Vesicovaginal fistula closure w/ tah  age 52  . Tubal ligation  1961  . Appendectomy    . Abdominal hysterectomy      in her 30's  . Small intestine surgery  1998    SBO  . Tonsillectomy    . Breast lumpectomy  2002    right breast  . Mastectomy  07/29/11    bilateral-rt nodes-none on left  . Cardiac catheterization  03/2011, 3/15  . Breast biopsy Right 01/21/2014    Procedure: BREAST/CHEST WALL BIOPSY;  Surgeon: [name], MD;  Location: [facility];  Service: General;  Laterality: Right;  . Vein ligation and stripping Right   . Left heart catheterization with coronary angiogram N/A 12/05/2013    Procedure: LEFT HEART CATHETERIZATION WITH CORONARY ANGIOGRAM;  Surgeon: [name], MD;  Location: [facility] CATH LAB;  Service: Cardiovascular;  Laterality: N/A;  . Left heart catheterization with coronary angiogram N/A 11/06/2014    Procedure: LEFT HEART CATHETERIZATION WITH CORONARY ANGIOGRAM;  Surgeon: [name], MD;  Location: [facility] CATH LAB;  Service: Cardiovascular;  Laterality: N/A;   Family History  Problem Relation Age of Onset  . Pancreatic cancer Mother   . Cancer Mother     pancreatic  . Asthma Brother   . Asthma Daughter   . Stroke Daughter   . Asthma Grandchild   . Asthma Brother     deceased  . Heart disease Father   . Emphysema Father   . Heart disease Brother   . Lymphoma Brother    History  Sexual Activity  . Sexual Activity: Not on file    Outpatient Encounter Prescriptions as of 04/25/2015  Medication Sig  . albuterol (PROVENTIL HFA;VENTOLIN HFA) 108 (90 BASE) MCG/ACT inhaler Inhale into the lungs every 6 (six) hours as  needed for wheezing or shortness of breath.  [name] aspirin EC 81 MG tablet Take 1 tablet (81 mg total) by mouth daily. (Patient taking differently: Take 162 mg by mouth daily. )  . B Complex-Biotin-FA (B-COMPLEX PO) Take 1 tablet by mouth daily.   . Biotin 5000 MCG CAPS Take 5,000 mcg by mouth daily.  . budesonide-formoterol (SYMBICORT) 160-4.5 MCG/ACT inhaler Inhale 2 puffs into the lungs 2 (two) times daily as needed.  . Calcium-Magnesium-Zinc 1000-400-15 MG TABS Take 1 tablet by mouth daily.    . celecoxib (CELEBREX) 200 MG capsule Take 1 capsule daily with food for pain & inflammation  . Cholecalciferol (VITAMIN D-3) 5000 UNITS TABS Take 1 tablet by mouth daily.   . diazepam (VALIUM) 5 MG tablet TAKE ONE-HALF TO ONE TABLET BY MOUTH EVERY 8 HOURS AS NEEDED FOR ANXIETY  . DULoxetine (CYMBALTA) 60 MG capsule Take 1 capsule by mouth  every day for mood (Patient taking differently: Take 60 mg by mouth every  other day. Take 1 capsule by mouth  every day for mood)  . ezetimibe (ZETIA) 10 MG tablet Take 10 mg by mouth daily.  . Fish Oil OIL Take 1 capsule by mouth daily.   . Flaxseed, Linseed, (GROUND FLAX SEEDS PO) Take 5 mLs by mouth daily.   [name] HYDROcodone-acetaminophen (NORCO) 5-325 MG per tablet Take 1 tablet by mouth every 6 (six) hours as needed for moderate pain or severe pain. (Patient taking differently: Take 1 tablet by mouth every 4 (four) hours as needed for moderate pain or severe pain. )  . isosorbide mononitrate (IMDUR) 30 MG 24 hr tablet Take 1 tablet (30 mg total) by mouth daily.  . Misc Natural Products (GLUCOSAMINE CHOND COMPLEX/MSM PO) Take 1 tablet by mouth daily.   . montelukast (SINGULAIR) 10 MG tablet Take 1 tablet (10 mg total) by mouth at bedtime.  . Multiple Vitamin (MULTIVITAMIN) tablet Take 1 tablet by mouth daily.  . nitroGLYCERIN (NITROSTAT) 0.4 MG SL tablet Place 1 tablet (0.4 mg total) under the tongue every 5 (five) minutes x 3 doses as needed for chest pain.  [name]  ondansetron (ZOFRAN ODT) 4 MG disintegrating tablet Take 1 tablet (4 mg total) by mouth every 8 (eight) hours as needed for nausea or vomiting.  [name] oxyCODONE-acetaminophen (PERCOCET) 10-325 MG per tablet Take 1 tablet by mouth every 4 (four) hours as needed for pain.  [name] oxymorphone (OPANA ER) 5 MG 12 hr tablet Take 1 tablet (5 mg total) by mouth every 12 (twelve) hours.  . [DISCONTINUED] citalopram (CELEXA) 40 MG tablet Take 1 tablet (40 mg total) by mouth daily. (Patient taking differently: Take 20 mg by mouth daily. Pt is taking 20 mg right now but slowly increasing to 40 mg)  . citalopram (CELEXA) 20 MG tablet Take 1 tablet (20 mg total) by mouth daily.   No facility-administered encounter medications on file as of 04/25/2015.    Activities of Daily Living In your present state of health, do you have any difficulty performing the following activities: 12/13/2014 12/13/2014  Hearing? - N  Vision? - Y  Difficulty concentrating or making decisions? - N  Walking or climbing stairs? - N  Dressing or bathing? - N  Doing errands, shopping? N N    Patient [name] Team: [name], DO as PCP - General (Family Medicine) [name], MD as Consulting Physician (Oncology) [name], MD as Consulting Physician (Cardiology) Suella Broad, MD as Consulting Physician (Physical Medicine and Rehabilitation)    Assessment:    CPE Exercise Activities and Dietary recommendations    Goals    None     Fall Risk Fall Risk  09/05/2014 05/13/2014 05/01/2014  Falls in the past year? Yes No No  Number falls in past yr: 1 - -  Injury with Fall? No - -  Risk for fall due to : History of fall(s);Impaired balance/gait Impaired mobility -  Risk for fall due to (comments): - Fall Risk band on wrist and note in Speciality Comments -   Depression Screen PHQ 2/9 Scores 09/05/2014 05/01/2014  PHQ - 2 Score 0 0     Cognitive Testing mmse 30/30  Immunization History  Administered Date(s)  Administered  . Hepatitis A 09/27/2008  . Hepatitis B 09/27/1989  . Influenza Split 06/27/2014  . Pneumococcal Conjugate-13 09/05/2014  . Pneumococcal Polysaccharide-23 07/18/2009  . Td 09/27/2008   Screening Tests Health Maintenance  Topic Date Due  . INFLUENZA VACCINE  04/28/2015  . TETANUS/TDAP  09/27/2018  . COLONOSCOPY  09/30/2024  . DEXA SCAN  Completed  . ZOSTAVAX  Completed  . PNA vac Low Risk Adult  Completed      Plan:    see AVS Check labs During the course of the visit the patient was educated and counseled about the following appropriate screening and preventive services:   Vaccines to include Pneumoccal, Influenza, Hepatitis B, Td, Zostavax, HCV  Electrocardiogram  Cardiovascular Disease  Colorectal cancer screening  Bone density screening  Diabetes screening  Glaucoma screening  Mammography/PAP  Nutrition counseling   Patient Instructions (the written plan) was given to the patient.  1. Depression   - citalopram (CELEXA) 20 MG tablet; Take 1 tablet (20 mg total) by mouth daily.  Dispense: 90 tablet; Refill: 3 - diazepam (VALIUM) 5 MG tablet; TAKE ONE-HALF TO ONE TABLET BY MOUTH EVERY 8 HOURS AS NEEDED FOR ANXIETY  Dispense: 45 tablet; Refill: 0 - DULoxetine (CYMBALTA) 60 MG capsule; Take 1 capsule by mouth  every day for mood  Dispense: 90 capsule; Refill: 0  2. Vitamin D deficiency   - Vitamin D 1,25 dihydroxy  3. Hyperlipidemia   - Hepatic function panel - Lipid panel - ezetimibe (ZETIA) 10 MG tablet; Take 1 tablet (10 mg total) by mouth daily.  4. Essential hypertension stable - Basic metabolic panel - CBC with Differential/Platelet - Hepatic function panel - Lipid panel - POCT urinalysis dipstick - Vitamin D 1,25 dihydroxy  5. Preventative health [name]    6. Asthma, chronic, mild intermittent, uncomplicated   - budesonide-formoterol (SYMBICORT) 160-4.5 MCG/ACT inhaler; Inhale 2 puffs into the lungs 2 (two) times daily as  needed.  Dispense: 3 Inhaler; Refill: 1 - albuterol (PROVENTIL HFA;VENTOLIN HFA) 108 (90 BASE) MCG/ACT inhaler; Inhale 2 puffs into the lungs every 6 (six) hours as needed for wheezing or shortness of breath. - montelukast (SINGULAIR) 10 MG tablet; Take 1 tablet (10 mg total) by mouth at bedtime.  Dispense: 90 tablet; Refill: 2  7. Chronic pain syndrome   - celecoxib (CELEBREX) 200 MG capsule; Take 1 capsule daily with food for pain & inflammation  Dispense: 90 capsule; Refill: 3  8. Breast cancer, right breast   - celecoxib (CELEBREX) 200 MG capsule; Take 1 capsule daily with food for pain & inflammation  Dispense: 90 capsule; Refill: 3  9. CAD in native artery   - isosorbide mononitrate (IMDUR) 30 MG 24 hr tablet; Take 1 tablet (30 mg total) by mouth daily.  Dispense: 90 tablet; Refill: 3 - nitroGLYCERIN (NITROSTAT) 0.4 MG SL tablet; Place 1 tablet (0.4 mg total) under the tongue every 5 (five) minutes x 3 doses as needed for chest pain.  Dispense: 25 tablet; Refill: 3  10. Routine history and physical examination of adult    [name], DO  04/25/2015

## 2015-04-25 NOTE — Progress Notes (Signed)
Pre visit review using our clinic review tool, if applicable. No additional management support is needed unless otherwise documented below in the visit note. 

## 2015-04-29 LAB — VITAMIN D 1,25 DIHYDROXY
Vitamin D 1, 25 (OH)2 Total: 46 pg/mL (ref 18–72)
Vitamin D2 1, 25 (OH)2: <8 pg/mL
Vitamin D3 1, 25 (OH)2: 46 pg/mL

## 2015-05-02 ENCOUNTER — Telehealth: Payer: Self-pay | Admitting: Family Medicine

## 2015-05-02 NOTE — Telephone Encounter (Signed)
LDL elevated and she wanted to know why her LDL is slight elevated, I advised that The foods that raise LDL cholesterol tend to be high in saturated fat, trans fat or dietary cholesterol. She verbalized understanding and has agreed to cut back on certain foods.      KP

## 2015-05-02 NOTE — Telephone Encounter (Signed)
Caller name:Burrough, Lachele Relation to [id number] Call back [phone] Pharmacy:  Reason for call: pt states she received a copy of her labs in the mail and would like for you to call her, states she has questions.

## 2015-05-06 ENCOUNTER — Encounter: Payer: Self-pay | Admitting: Internal Medicine

## 2015-05-07 ENCOUNTER — Telehealth: Payer: Self-pay | Admitting: Family Medicine

## 2015-05-07 NOTE — Telephone Encounter (Signed)
Notified pt per 04/25/15 lab result (normal vit D).

## 2015-05-07 NOTE — Telephone Encounter (Signed)
Caller name: Relation to ZO:XWRU Call back [phone] Pharmacy:  Reason for call: pt states she received a copy of her last lab results but there was no evidence of her viatamin D results on it and she wants to know why she likes to stay in range so she want to know what it was

## 2015-05-08 ENCOUNTER — Telehealth: Payer: Self-pay

## 2015-05-08 NOTE — Telephone Encounter (Signed)
Pt called requesting a hard copy of her percocet refill. She will be by tomorrow to pick it up.

## 2015-05-09 ENCOUNTER — Other Ambulatory Visit: Payer: Self-pay | Admitting: *Deleted

## 2015-05-09 DIAGNOSIS — Z853 Personal history of malignant neoplasm of breast: Secondary | ICD-10-CM

## 2015-05-09 MED ORDER — OXYCODONE-ACETAMINOPHEN 10-325 MG PO TABS: 1.0000 | ORAL_TABLET | ORAL | Status: DC | PRN

## 2015-05-14 ENCOUNTER — Other Ambulatory Visit (HOSPITAL_BASED_OUTPATIENT_CLINIC_OR_DEPARTMENT_OTHER): Payer: Medicare Other

## 2015-05-14 ENCOUNTER — Telehealth: Payer: Self-pay | Admitting: Oncology

## 2015-05-14 ENCOUNTER — Other Ambulatory Visit: Payer: Self-pay | Admitting: Oncology

## 2015-05-14 ENCOUNTER — Ambulatory Visit (HOSPITAL_BASED_OUTPATIENT_CLINIC_OR_DEPARTMENT_OTHER): Payer: Medicare Other | Admitting: Oncology

## 2015-05-14 ENCOUNTER — Ambulatory Visit (HOSPITAL_BASED_OUTPATIENT_CLINIC_OR_DEPARTMENT_OTHER): Payer: Medicare Other

## 2015-05-14 VITALS — BP 119/78 | HR 66 | Temp 97.7°F | Resp 18 | Ht 62.0 in | Wt 127.3 lb

## 2015-05-14 DIAGNOSIS — C7989 Secondary malignant neoplasm of other specified sites: Secondary | ICD-10-CM

## 2015-05-14 DIAGNOSIS — Z5111 Encounter for antineoplastic chemotherapy: Secondary | ICD-10-CM

## 2015-05-14 DIAGNOSIS — C50919 Malignant neoplasm of unspecified site of unspecified female breast: Secondary | ICD-10-CM

## 2015-05-14 DIAGNOSIS — G8929 Other chronic pain: Secondary | ICD-10-CM

## 2015-05-14 DIAGNOSIS — M810 Age-related osteoporosis without current pathological fracture: Secondary | ICD-10-CM | POA: Diagnosis not present

## 2015-05-14 DIAGNOSIS — C50911 Malignant neoplasm of unspecified site of right female breast: Secondary | ICD-10-CM | POA: Diagnosis not present

## 2015-05-14 DIAGNOSIS — Z170 Estrogen receptor positive status [ER+]: Secondary | ICD-10-CM

## 2015-05-14 LAB — COMPREHENSIVE METABOLIC PANEL (CC13)
ALBUMIN: 3.8 g/dL (ref 3.5–5.0)
ALK PHOS: 56 U/L (ref 40–150)
ALT: 11 U/L (ref 0–55)
ANION GAP: 5 meq/L (ref 3–11)
AST: 18 U/L (ref 5–34)
BILIRUBIN TOTAL: 0.42 mg/dL (ref 0.20–1.20)
BUN: 11.6 mg/dL (ref 7.0–26.0)
CO2: 28 meq/L (ref 22–29)
CREATININE: 0.8 mg/dL (ref 0.6–1.1)
Calcium: 9.5 mg/dL (ref 8.4–10.4)
Chloride: 103 mEq/L (ref 98–109)
EGFR: 74 mL/min/{1.73_m2} — AB (ref >90)
Glucose: 91 mg/dl (ref 70–140)
Potassium: 5.1 mEq/L (ref 3.5–5.1)
Sodium: 137 mEq/L (ref 136–145)
TOTAL PROTEIN: 6.4 g/dL (ref 6.4–8.3)

## 2015-05-14 LAB — CBC WITH DIFFERENTIAL/PLATELET
BASO%: 0.8 % (ref 0.0–2.0)
BASOS ABS: 0 10*3/uL (ref 0.0–0.1)
EOS%: 1 % (ref 0.0–7.0)
Eosinophils Absolute: 0.1 10*3/uL (ref 0.0–0.5)
HEMATOCRIT: 39.5 % (ref 34.8–46.6)
HGB: 13.1 g/dL (ref 11.6–15.9)
LYMPH#: 2.1 10*3/uL (ref 0.9–3.3)
LYMPH%: 37.7 % (ref 14.0–49.7)
MCH: 30.5 pg (ref 25.1–34.0)
MCHC: 33.3 g/dL (ref 31.5–36.0)
MCV: 91.6 fL (ref 79.5–101.0)
MONO#: 0.4 10*3/uL (ref 0.1–0.9)
MONO%: 8.1 % (ref 0.0–14.0)
NEUT#: 2.8 10*3/uL (ref 1.5–6.5)
NEUT%: 52.4 % (ref 38.4–76.8)
Platelets: 226 10*3/uL (ref 145–400)
RBC: 4.31 10*6/uL (ref 3.70–5.45)
RDW: 13.7 % (ref 11.2–14.5)
WBC: 5.4 10*3/uL (ref 3.9–10.3)

## 2015-05-14 MED ORDER — FULVESTRANT 250 MG/5ML IM SOLN
500.0000 mg | Freq: Once | INTRAMUSCULAR | Status: AC
  Administered 2015-05-14: 500 mg via INTRAMUSCULAR
  Filled 2015-05-14: qty 10

## 2015-05-14 MED ORDER — OXYCODONE HCL 5 MG PO TABS: 5.0000 mg | ORAL_TABLET | ORAL | Status: DC | PRN

## 2015-05-14 MED ORDER — ASPIRIN EC 81 MG PO TBEC: 162.0000 mg | DELAYED_RELEASE_TABLET | Freq: Every day | ORAL | Status: DC

## 2015-05-14 NOTE — Telephone Encounter (Signed)
Appointments made and avs printed for patient °

## 2015-05-14 NOTE — Progress Notes (Signed)
[place]  [phone] [phone]     ID: Angel [name] DOB: 1932-07-15  MR#: [id number]  DQQ#:[id number]  Patient Care Team: [name], DO as PCP - General (Family Medicine) [name], MD as Consulting Physician (Oncology) [name], MD as Consulting Physician (Cardiology) Suella Broad, MD as Consulting Physician (Physical Medicine and Rehabilitation) OTHER M.D.: Maisie Fus Kurup (ophth)  CHIEF COMPLAINT: Chest wall recurrence of breast cancer CURRENT TREATMENT: Letrozole  BREAST CANCER HISTORY: Per Dr. 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 She had a second right breast cancer in 2012, with right mastectomy by Dr [name] 07-29-2011 for T1N0M0 ER/PR positive, HER-2 negative invasive ductal carcinoma; she also had what was planned as prophylactic left mastectomy, with unexpected finding of left DCIS in that pathology. Plan in 09-2011 was to begin tamoxifen, chosen particularly due to low bone density. Patient did not keep follow up visits at this office, stopped tamoxifen due intolerance (tho she does not remember what problem she had with this) and was treated with raloxifene by Dr [name].  She was seen next at this office on 12-26-2013 with an enlarging nodular area above right mastectomy scar,reportedly present for several months. Excisional biopsy done 01-21-14 found grade 2 invasive ductal carcinoma with involvement at [facility]) and lymphovascular space invasion, ER positive 100%, PR positive 75%, proliferation marker 80% and HER-2 negative by [facility] (pathology [phone]). PET 02-15-14 did not identify any involvement beyond right chest wall. She had 4500 cGy as electron beam radiation from 5-27 thru 03-27-14 to right chest wall. By 04-30-14 there were multiple tiny nodules in region of right mastectomy  scar."  The patient's subsequent history is as detailed below  INTERVAL HISTORY: Angel [name] returns today for follow up of her recurrent breast cancer, accompanied by her daughter. Angel [name] continues on fulvestrant monthly. She is tolerating it well. She tells me she is not even aware when she receives the shots. She has no side effects from it that she can report  REVIEW OF SYSTEMS: Angel [name]'s pain is generally well-controlled but her pain regimen is somewhat complicated. She received Opana from her primary care physician, she says, but does not like that drug. It makes her drowsy, blurred vision, and nauseated. She would like to stop it. She uses hydrocodone/a Pap from Dr. Dossie Der as her primary pain medication. She usually takes 2 in the morning and then one year or 2 or 3 more times during the day. She normally receives 180 a month from Dr. Dossie Der esophagus. From [place] she has been receiving Percocet, and the concern there is that it also has Tylenol. She uses the Percocet for "breakthrough pain". She is not constipated despite the narcotics she takes manner MiraLAX and Senokot appropriately. Aside from those issues, she has occasional insomnia problems for which she uses Valium. She has mild hot flashes at times. She has poor exercise tolerance and cannot get her house work done. She has not been using a a lot of nitroglycerin lately but does have angina at times. A detailed review of systems was otherwise stable  PAST MEDICAL HISTORY: Past Medical History  Diagnosis Date  . Asthma   . Spinal stenosis   . Osteoporosis   . Chronic bronchitis   . DDD (degenerative disc disease)   . Osteoarthritis   . Degenerative joint disease   . Coronary artery disease Non-obstructive  a. 2012 Cath: LAD 40-58m>Med Rx;  b. 11/2013 Echo: EF 50-55%, nl wall motion;  c. 12/2013 Lexi MV: EF 74%, no ischemia.  . Depression   . Anxiety   . COPD (chronic obstructive pulmonary disease)   . Dyslipidemia   . Cholelithiasis    . GERD (gastroesophageal reflux disease)   . Hx of radiation therapy 07/02/1999- 08/11/1999    right supraclavucular/axillary region: 5040 cGy, 28 fractions  . Hx of radiation therapy 02/20/14- 03/27/14    right chest wall 4500 cGy 25 sessions  . Hypertension   . Kidney stones     one stones  . Cataract     eye surgery planned for 11/2014  . PVC's (premature ventricular contractions)   . Reflux esophagitis   . Pre-diabetes   . Skin cancer of face     non melanoma  . Breast cancer 05/07/1999, 06/2011    a. s/p bilat mastectomies. recurrence 01/2014. Radiation, meds.  . Chest wall recurrence of breast cancer 2016  . Cancer of chest (wall)     PAST SURGICAL HISTORY: Past Surgical History  Procedure Laterality Date  . Vesicovaginal fistula closure w/ tah  age 81 . Tubal ligation  1961  . Appendectomy    . Abdominal hysterectomy      in her 30's  . Small intestine surgery  1998    [facility]  . Tonsillectomy    . Breast lumpectomy  2002    right breast  . Mastectomy  07/29/11    bilateral-rt nodes-none on left  . Cardiac catheterization  03/2011, 3/15  . Breast biopsy Right 01/21/2014    Procedure: BREAST/CHEST WALL BIOPSY;  Surgeon: ARalene Ok MD;  [facility]  Service: General;  Laterality: Right;  . Vein ligation and stripping Right   . Left heart catheterization with coronary angiogram N/A 12/05/2013    Procedure: LEFT HEART CATHETERIZATION WITH CORONARY ANGIOGRAM;  Surgeon: CBurnell Blanks MD;  [facility];  Service: Cardiovascular;  Laterality: N/A;  . Left heart catheterization with coronary angiogram N/A 11/06/2014    Procedure: LEFT HEART CATHETERIZATION WITH CORONARY ANGIOGRAM;  Surgeon: HSinclair Grooms MD;  [facility];  Service: Cardiovascular;  Laterality: N/A;    FAMILY HISTORY Family History  Problem Relation Age of Onset  . Pancreatic cancer Mother   . Cancer Mother     pancreatic  . Asthma Brother   . Asthma Daughter   .  Stroke Daughter   . Asthma Grandchild   . Asthma Brother     deceased  . Heart disease Father   . Emphysema Father   . Heart disease Brother   . Lymphoma Brother    the patient's father died at the age of 639from heart disease. He was a smoker. The patient's mother died at the age of 858with cancer of the pancreas. The patient has 3 brothers, no sisters. There is no history of breast or ovarian cancer in the family to her knowledge.  GYNECOLOGIC HISTORY:  No LMP recorded. Patient has had a hysterectomy. Menarche age 81 first live birth age 81 She is GX P4. She stopped having periods in her 89s She took hormone replacement for approximately 10 years. 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 She has been "single" for 40 years. Her oldest daughter died from a subarachnoid hemorrhage at age 79 2 years  ago. This is when the patient had undergone treatment for invasive breast cancer and she tells me she was simply overwhelmed and could not start the planned antiestrogen therapy. The next daughter, Angel [name], is a retired Software engineer. She lives in [place]. Next child, Angel [name], works in Charity fundraiser. She also lives in [place]. The youngest, Angel [name], is retired from the Office manager position. He also owned his own business. The patient has 7 grandchildren and 2 many great-grandchildren to count. She attends the [employer]    ADVANCED DIRECTIVES: In place; the patient's daughter Angel [name] is her healthcare power of attorney. She can be reached at [place]: Social History  Substance Use Topics  . Smoking status: Never Smoker   . Smokeless tobacco: Never Used  . Alcohol Use: Yes     Comment: occ wine     Colonoscopy:  PAP:  Bone density:  Lipid panel:  Allergies  Allergen Reactions  . Penicillins Rash  . Adhesive [Tape] Other (See  Comments)    Pt prefers to use paper tape    Current Outpatient Prescriptions  Medication Sig Dispense Refill  . albuterol (PROVENTIL HFA;VENTOLIN HFA) 108 (90 BASE) MCG/ACT inhaler Inhale 2 puffs into the lungs every 6 (six) hours as needed for wheezing or shortness of breath.    [name] aspirin EC 81 MG tablet Take 2 tablets (162 mg total) by mouth daily.    . B Complex-Biotin-FA (B-COMPLEX PO) Take 1 tablet by mouth daily.     . Biotin 5000 MCG CAPS Take 5,000 mcg by mouth daily.    . budesonide-formoterol (SYMBICORT) 160-4.5 MCG/ACT inhaler Inhale 2 puffs into the lungs 2 (two) times daily as needed. 3 Inhaler 1  . Calcium-Magnesium-Zinc 1000-400-15 MG TABS Take 1 tablet by mouth daily.      . celecoxib (CELEBREX) 200 MG capsule Take 1 capsule daily with food for pain & inflammation 90 capsule 3  . Cholecalciferol (VITAMIN D-3) 5000 UNITS TABS Take 1 tablet by mouth daily.     . citalopram (CELEXA) 20 MG tablet Take 1 tablet (20 mg total) by mouth daily. 90 tablet 3  . diazepam (VALIUM) 5 MG tablet TAKE ONE-HALF TO ONE TABLET BY MOUTH EVERY 8 HOURS AS NEEDED FOR ANXIETY 45 tablet 0  . DULoxetine (CYMBALTA) 60 MG capsule Take 1 capsule by mouth  every day for mood 90 capsule 0  . ezetimibe (ZETIA) 10 MG tablet Take 1 tablet (10 mg total) by mouth daily.    . Fish Oil OIL Take 1 capsule by mouth daily.     . Flaxseed, Linseed, (GROUND FLAX SEEDS PO) Take 5 mLs by mouth daily.     [name] HYDROcodone-acetaminophen (NORCO) 5-325 MG per tablet Take 1 tablet by mouth every 6 (six) hours as needed for moderate pain or severe pain. (Patient taking differently: Take 1 tablet by mouth every 4 (four) hours as needed for moderate pain or severe pain. ) 20 tablet 0  . isosorbide mononitrate (IMDUR) 30 MG 24 hr tablet Take 1 tablet (30 mg total) by mouth daily. 90 tablet 3  . montelukast (SINGULAIR) 10 MG tablet Take 1 tablet (10 mg total) by mouth at bedtime. 90 tablet 2  . Multiple Vitamin (MULTIVITAMIN) tablet  Take 1 tablet by mouth daily.    . nitroGLYCERIN (NITROSTAT) 0.4 MG SL tablet Place 1 tablet (0.4 mg total) under the tongue every 5 (five) minutes x 3 doses as needed for chest pain. 25 tablet 3  . ondansetron (ZOFRAN  ODT) 4 MG disintegrating tablet Take 1 tablet (4 mg total) by mouth every 8 (eight) hours as needed for nausea or vomiting. 20 tablet 0  . oxyCODONE (ROXICODONE) 5 MG immediate release tablet Take 1 tablet (5 mg total) by mouth every 4 (four) hours as needed for severe pain. 30 tablet 0   No current facility-administered medications for this visit.    OBJECTIVE: Elderly white woman who appears younger than stated age 61 Vitals:   05/14/15 1143  BP: 119/78  Pulse: 66  Temp: 97.7 F (36.5 C)  Resp: 18     Body mass index is 23.28 kg/(m^2).    ECOG FS:1 - Symptomatic but completely ambulatory  Sclerae unicteric, EOMs intact (left eye is blind)  Oropharynx clear, no thrush or other lesions No cervical or supraclavicular adenopathy Lungs no rales or rhonchi Heart regular rate and rhythm Abd soft, nontender, positive bowel sounds MSK scoliosis but no focal spinal tenderness, no upper extremity lymphedema Neuro: nonfocal, well oriented, positive affect Breasts: Status post bilateral mastectomies. As no evidence of chest wall recurrence. Both axillae are benign   LAB RESULTS:  CMP     Component Value Date/Time   NA 136 04/25/2015 0938   NA 138 04/16/2015 1122   K 3.9 04/25/2015 0938   K 4.1 04/16/2015 1122   CL 100 04/25/2015 0938   CO2 31 04/25/2015 0938   CO2 30* 04/16/2015 1122   GLUCOSE 80 04/25/2015 0938   GLUCOSE 87 04/16/2015 1122   BUN 11 04/25/2015 0938   BUN 9.1 04/16/2015 1122   CREATININE 0.70 04/25/2015 0938   CREATININE 0.8 04/16/2015 1122   CREATININE 0.78 05/01/2014 1050   CALCIUM 9.3 04/25/2015 0938   CALCIUM 9.7 04/16/2015 1122   PROT 6.8 04/25/2015 0938   PROT 6.9 04/16/2015 1122   ALBUMIN 4.1 04/25/2015 0938   ALBUMIN 4.1 04/16/2015  1122   AST 16 04/25/2015 0938   AST 15 04/16/2015 1122   ALT 11 04/25/2015 0938   ALT 12 04/16/2015 1122   ALKPHOS 51 04/25/2015 0938   ALKPHOS 62 04/16/2015 1122   BILITOT 0.5 04/25/2015 0938   BILITOT 0.47 04/16/2015 1122   GFRNONAA >60 02/14/2015 2002   GFRNONAA 73 05/01/2014 1050   GFRAA >60 02/14/2015 2002   GFRAA 84 05/01/2014 1050    I No results found for: SPEP  Lab Results  Component Value Date   WBC 5.4 05/14/2015   NEUTROABS 2.8 05/14/2015   HGB 13.1 05/14/2015   HCT 39.5 05/14/2015   MCV 91.6 05/14/2015   PLT 226 05/14/2015      Chemistry      Component Value Date/Time   NA 136 04/25/2015 0938   NA 138 04/16/2015 1122   K 3.9 04/25/2015 0938   K 4.1 04/16/2015 1122   CL 100 04/25/2015 0938   CO2 31 04/25/2015 0938   CO2 30* 04/16/2015 1122   BUN 11 04/25/2015 0938   BUN 9.1 04/16/2015 1122   CREATININE 0.70 04/25/2015 0938   CREATININE 0.8 04/16/2015 1122   CREATININE 0.78 05/01/2014 1050      Component Value Date/Time   CALCIUM 9.3 04/25/2015 0938   CALCIUM 9.7 04/16/2015 1122   ALKPHOS 51 04/25/2015 0938   ALKPHOS 62 04/16/2015 1122   AST 16 04/25/2015 0938   AST 15 04/16/2015 1122   ALT 11 04/25/2015 0938   ALT 12 04/16/2015 1122   BILITOT 0.5 04/25/2015 0938   BILITOT 0.47 04/16/2015 1122  Lab Results  Component Value Date   LABCA2 20 01/31/2014    No components found for: [id number]  No results for input(s): INR in the last 168 hours.  Urinalysis    Component Value Date/Time   COLORURINE YELLOW 02/14/2015 2010   APPEARANCEUR CLOUDY* 02/14/2015 2010   LABSPEC 1.008 02/14/2015 2010   PHURINE 6.5 02/14/2015 2010   [place] NEGATIVE 02/14/2015 2010   HGBUR MODERATE* 02/14/2015 2010   BILIRUBINUR neg 04/25/2015 1015   [place] NEGATIVE 02/14/2015 2010   [place] NEGATIVE 02/14/2015 2010   PROTEINUR neg 04/25/2015 1015   PROTEINUR NEGATIVE 02/14/2015 2010   UROBILINOGEN 0.2 04/25/2015 1015   UROBILINOGEN 0.2  02/14/2015 2010   NITRITE neg 04/25/2015 1015   NITRITE NEGATIVE 02/14/2015 2010   LEUKOCYTESUR Negative 04/25/2015 1015    STUDIES: No results found.   ASSESSMENT: 81 y.o. [place] woman with locally recurrent breast cancer, as follows:  (1) status post right lumpectomy and sentinel node sampling September 2000 for ductal carcinoma in situ (Ni+), estrogen and progesterone receptor positive, with a low proliferation index,  (a) received 5040 cGy to the right breast and regional lymph nodes  (b) on tamoxifen less than 3 months  (2) s/p bilateral mastectomies and right axillary lymph node sampling 07/29/2011, showing  (a) in the left breast, ductal carcinoma in situ measuring 1.2 cm, grade 1, with negative margins, estrogen and progesterone receptor positive  (b)  on the right, a pT1c pN0, stage IA invasive ductal carcinoma, estrogen receptor and 93% positive, progesterone receptor 87% positive, with an MIB-1 of 35%, and no HER-2 amplification margins were close but negative  (c) did not receive adjuvant antiestrogen therapy  (3) right chest wall skin recurrence resected 01/21/2014, measuring 1.7 cm, with positive margins, estrogen receptor 100% positive, progesterone receptor 75% positive, with an MIB-1 of 80%, and no HER-2 amplification; PET scan 02/15/2014 negative except for Right chest wall focus  (a) received 45 Gy electron-beam therapy to the right chest wall completed 03/27/2014  (b) skin progression within and without the radiation field noted 04/30/2014  (c) letrozole started 05/13/2014, discontinued March 2016 with progression   (4) dexa scan [facility]/09/2014 at [facility] shows osteoporosis with a T score of -2.9  (5) Fulvestrant started 12/25/2014  PLAN: Elsy is tolerating the fulvestrant well. At present there is no evidence of disease activity. We are continuing the fulvestrant every 28 days and I am restaging her with a CT of the chest before she returns to see me in  November. In the meantime we will check her CA-27-29 on a regular basis.  We discussed pain issues. She has been receiving hydrocodone/Tylenol  (5/325) from Dr. Nelva Bush. She usually takes 2 in the morning and then one 2 or 3 times during the day. Sometimes though she uses Percocet for "breakthrough pain". She does not want to use the Opana ER anymore because it makes her "goofy" nauseated, and causes blurred vision.  I think she could take 2 of the hydrocodone up to 4 times a day (a total Tylenol dose would be 2.8 g, which is acceptable) before using oxycodone. Instead of Percocet she could use the oxycodone alone for breakthrough pain. That would take care of the concern regarding Tylenol. She would be using that very infrequently, I would expect. Perhaps in this way we could get to a single narcotic analgesic provided by a single physician, which I think would be safer for her.. This means she will need to obtain 240 tablets monthly from  Dr. Dossie Der his office if she uses the hydrocodone maximally  Overall I am very pleased with how well Angel [name] is doing [name], MD   05/14/2015 12:16 PM

## 2015-05-24 ENCOUNTER — Other Ambulatory Visit: Payer: Self-pay | Admitting: Family Medicine

## 2015-05-26 NOTE — Telephone Encounter (Signed)
Last seen and filled 04/25/15 #45   Please advise     KP

## 2015-05-28 ENCOUNTER — Telehealth: Payer: Self-pay | Admitting: Family Medicine

## 2015-05-28 NOTE — Telephone Encounter (Signed)
Valium last filled: 05/26/15  Please advise.

## 2015-05-28 NOTE — Telephone Encounter (Signed)
Pt called stating that she picked up RX for valium. She states that she takes valium sometimes during the day when her back is hurting and pain meds are working because it helps to relax her and the pain. Pt is taking 2ud at night to get to sleep. She said she had Belsomra for sleep but she was having nightmares and felt hungover in the morning. She is asking for an increase in quantity of valium prescribed.  Best phone # [phone]

## 2015-05-28 NOTE — Telephone Encounter (Signed)
Ok to change to #60 and sig bid prn but she will need to bring old rx in

## 2015-05-28 NOTE — Telephone Encounter (Signed)
Pt informed that Dr. Etter Sjogren increased dose.  She was asked to bring Rx back to the office.  Pt stated that she had already taken script to [place].  [facility].  Unable to get in contact with [facility] pharmacist.  Phone just rang and rang.  Will try back later.

## 2015-05-30 NOTE — Telephone Encounter (Signed)
[place].  Pharm Tech, Pam, stated that patient picked up old Rx yesterday at 3:26 pm.  Called patient and she confirmed.  She said that she knew it was not the right script, but said she would take the meds and once she runs out, will call back for refill.  Apologies were offered to patient for not being able to call in new Rx in a timely fashion.  Pt stated understanding and said it was ok, and that she was glad we called.  She will call back when refill is needed.

## 2015-06-05 ENCOUNTER — Telehealth: Payer: Self-pay | Admitting: *Deleted

## 2015-06-05 ENCOUNTER — Other Ambulatory Visit: Payer: Self-pay | Admitting: Family Medicine

## 2015-06-05 ENCOUNTER — Other Ambulatory Visit: Payer: Self-pay | Admitting: Oncology

## 2015-06-05 ENCOUNTER — Other Ambulatory Visit: Payer: Self-pay | Admitting: *Deleted

## 2015-06-05 ENCOUNTER — Telehealth: Payer: Self-pay

## 2015-06-05 DIAGNOSIS — C50911 Malignant neoplasm of unspecified site of right female breast: Secondary | ICD-10-CM

## 2015-06-05 DIAGNOSIS — J4520 Mild intermittent asthma, uncomplicated: Secondary | ICD-10-CM

## 2015-06-05 MED ORDER — LETROZOLE 2.5 MG PO TABS: 2.5000 mg | ORAL_TABLET | Freq: Every day | ORAL | Status: DC

## 2015-06-05 NOTE — Telephone Encounter (Signed)
Rx faxed on 05/26/15.      KP

## 2015-06-05 NOTE — Telephone Encounter (Signed)
[facility] is now [facility]  Order resent to correct pharmacy.

## 2015-06-05 NOTE — Telephone Encounter (Signed)
PT. HAS TAKEN HER LAST PILL.

## 2015-06-05 NOTE — Telephone Encounter (Signed)
Patient called today requesting refills on letrozole 2.5 mg, she is requesting that 30 tabs be called into [facility] on [address] because she is completely out of the meds as of today, and 90 tabs with one refill be sent to [facility].  Per Dr. Jana Hakim he would like to start her back on the medication although patient never discontinued it.  Refills sent to both phamacy's.  Patient called and states understanding of both refills.

## 2015-06-05 NOTE — Progress Notes (Unsigned)
Do is 1 secondly we had stopped bar breasts letrozole in March, when she started fulvestrant, but she never actually did stop it and she is very intent on continuing on it. We do have data that in patients who have not had tamoxifen previously the double combination of fulvestrant and aromatase inhibitor can be of somewhat improved effectiveness. Accordingly we are going back or rather continuing on the Femara (she never actually stopped).

## 2015-06-11 ENCOUNTER — Other Ambulatory Visit: Payer: Self-pay | Admitting: *Deleted

## 2015-06-11 ENCOUNTER — Telehealth: Payer: Self-pay | Admitting: *Deleted

## 2015-06-11 ENCOUNTER — Other Ambulatory Visit (HOSPITAL_BASED_OUTPATIENT_CLINIC_OR_DEPARTMENT_OTHER): Payer: Medicare Other

## 2015-06-11 ENCOUNTER — Ambulatory Visit (HOSPITAL_BASED_OUTPATIENT_CLINIC_OR_DEPARTMENT_OTHER): Payer: Medicare Other

## 2015-06-11 VITALS — BP 107/56 | HR 70 | Temp 97.9°F

## 2015-06-11 DIAGNOSIS — C50911 Malignant neoplasm of unspecified site of right female breast: Secondary | ICD-10-CM | POA: Diagnosis not present

## 2015-06-11 DIAGNOSIS — C50919 Malignant neoplasm of unspecified site of unspecified female breast: Secondary | ICD-10-CM | POA: Diagnosis not present

## 2015-06-11 DIAGNOSIS — M810 Age-related osteoporosis without current pathological fracture: Secondary | ICD-10-CM

## 2015-06-11 DIAGNOSIS — C7989 Secondary malignant neoplasm of other specified sites: Secondary | ICD-10-CM | POA: Diagnosis not present

## 2015-06-11 DIAGNOSIS — G8929 Other chronic pain: Secondary | ICD-10-CM

## 2015-06-11 DIAGNOSIS — Z5111 Encounter for antineoplastic chemotherapy: Secondary | ICD-10-CM

## 2015-06-11 LAB — COMPREHENSIVE METABOLIC PANEL (CC13)
ALBUMIN: 3.7 g/dL (ref 3.5–5.0)
ALK PHOS: 51 U/L (ref 40–150)
ALT: 11 U/L (ref 0–55)
ANION GAP: 6 meq/L (ref 3–11)
AST: 20 U/L (ref 5–34)
BILIRUBIN TOTAL: 0.38 mg/dL (ref 0.20–1.20)
BUN: 11.7 mg/dL (ref 7.0–26.0)
CALCIUM: 9.3 mg/dL (ref 8.4–10.4)
CO2: 30 mEq/L — ABNORMAL HIGH (ref 22–29)
Chloride: 104 mEq/L (ref 98–109)
Creatinine: 0.8 mg/dL (ref 0.6–1.1)
EGFR: 72 mL/min/{1.73_m2} — AB (ref >90)
GLUCOSE: 90 mg/dL (ref 70–140)
Potassium: 4.3 mEq/L (ref 3.5–5.1)
Sodium: 140 mEq/L (ref 136–145)
TOTAL PROTEIN: 6.3 g/dL — AB (ref 6.4–8.3)

## 2015-06-11 LAB — CBC WITH DIFFERENTIAL/PLATELET
BASO%: 0.7 % (ref 0.0–2.0)
BASOS ABS: 0 10*3/uL (ref 0.0–0.1)
EOS ABS: 0.1 10*3/uL (ref 0.0–0.5)
EOS%: 1.8 % (ref 0.0–7.0)
HEMATOCRIT: 40.7 % (ref 34.8–46.6)
HEMOGLOBIN: 13.4 g/dL (ref 11.6–15.9)
LYMPH%: 35.1 % (ref 14.0–49.7)
MCH: 30.3 pg (ref 25.1–34.0)
MCHC: 32.9 g/dL (ref 31.5–36.0)
MCV: 92.1 fL (ref 79.5–101.0)
MONO#: 0.4 10*3/uL (ref 0.1–0.9)
MONO%: 7 % (ref 0.0–14.0)
NEUT%: 55.4 % (ref 38.4–76.8)
NEUTROS ABS: 2.9 10*3/uL (ref 1.5–6.5)
PLATELETS: 217 10*3/uL (ref 145–400)
RBC: 4.42 10*6/uL (ref 3.70–5.45)
RDW: 13.3 % (ref 11.2–14.5)
WBC: 5.3 10*3/uL (ref 3.9–10.3)
lymph#: 1.9 10*3/uL (ref 0.9–3.3)

## 2015-06-11 MED ORDER — FULVESTRANT 250 MG/5ML IM SOLN
500.0000 mg | Freq: Once | INTRAMUSCULAR | Status: AC
  Administered 2015-06-11: 500 mg via INTRAMUSCULAR
  Filled 2015-06-11: qty 10

## 2015-06-11 MED ORDER — OXYCODONE HCL 5 MG PO TABS: 5.0000 mg | ORAL_TABLET | ORAL | Status: DC | PRN

## 2015-06-11 NOTE — Telephone Encounter (Signed)
"  I need a refill on pain medication.  I'll be there today for lab and injection."  Called collaborative nurse with this request.

## 2015-06-11 NOTE — Telephone Encounter (Signed)
Opened in error

## 2015-06-12 ENCOUNTER — Telehealth: Payer: Self-pay | Admitting: Family Medicine

## 2015-06-12 DIAGNOSIS — E785 Hyperlipidemia, unspecified: Secondary | ICD-10-CM

## 2015-06-12 DIAGNOSIS — F329 Major depressive disorder, single episode, unspecified: Secondary | ICD-10-CM

## 2015-06-12 DIAGNOSIS — F32A Depression, unspecified: Secondary | ICD-10-CM

## 2015-06-12 LAB — CANCER ANTIGEN 27.29: CA 27.29: 20 U/mL (ref 0–39)

## 2015-06-12 NOTE — Telephone Encounter (Signed)
Relation to QI:XMDE Call back number: [phone] Pharmacy: [facility] [place], [place]. [phone] (Phone) [phone] (Fax)         Reason for call:  Patient was advised to call back in "15 days" to inform PCP regarding diazepam (VALIUM). patient requesting a refill and would like to discuss the direction. Please advise.

## 2015-06-12 NOTE — Telephone Encounter (Signed)
Spoke with patient and she stated the Diazepam was doing good. She said she wanted to take 1 in the morning and 1 during the day and 2 at night to help her sleep. Please advise and advise on a quantity and I will call it in tomorrow, she said she can not take sleeping pills. You have previously approved for the patient to get #60 1 Bid, the person who spoke with the patient the other day misunderstood what she was asking for. The Rx for the #60 has not been sent because she is wanting 4 daily instead of 2. Please advise      KP

## 2015-06-12 NOTE — Telephone Encounter (Signed)
I really don't like giving anyone that much valium--- esp elderly.  We should increase celexa 40 mg #30 1 po qd And she can take 2 valium at night if she needs it If that doesn't work we will need to rethink the meds

## 2015-06-13 MED ORDER — DIAZEPAM 5 MG PO TABS: 5.0000 mg | ORAL_TABLET | Freq: Two times a day (BID) | ORAL | Status: DC | PRN

## 2015-06-13 MED ORDER — CITALOPRAM HYDROBROMIDE 40 MG PO TABS: 40.0000 mg | ORAL_TABLET | Freq: Every day | ORAL | Status: DC

## 2015-06-13 NOTE — Telephone Encounter (Signed)
Rx has been phoned in for Diazepam 5 mg po bid #60 and the patient has agreed to increase to the 40 mg of Celexa. She already has the Rx because she was on 40 in the past and she has been cutting the tablet in half, she will call when she needs the refills. She will follow up prn.     KP

## 2015-07-01 DIAGNOSIS — M5030 Other cervical disc degeneration, unspecified cervical region: Secondary | ICD-10-CM | POA: Diagnosis not present

## 2015-07-01 DIAGNOSIS — M5136 Other intervertebral disc degeneration, lumbar region: Secondary | ICD-10-CM | POA: Diagnosis not present

## 2015-07-03 ENCOUNTER — Other Ambulatory Visit: Payer: Self-pay | Admitting: Internal Medicine

## 2015-07-05 ENCOUNTER — Other Ambulatory Visit: Payer: Self-pay | Admitting: Internal Medicine

## 2015-07-05 ENCOUNTER — Other Ambulatory Visit: Payer: Self-pay | Admitting: Family Medicine

## 2015-07-07 NOTE — Telephone Encounter (Signed)
Last seen 04/25/15 and filled 06/13/15 #60  Please advise     KP

## 2015-07-08 ENCOUNTER — Telehealth: Payer: Self-pay | Admitting: Family Medicine

## 2015-07-08 ENCOUNTER — Other Ambulatory Visit: Payer: Self-pay

## 2015-07-08 DIAGNOSIS — C50911 Malignant neoplasm of unspecified site of right female breast: Secondary | ICD-10-CM

## 2015-07-08 DIAGNOSIS — F329 Major depressive disorder, single episode, unspecified: Secondary | ICD-10-CM

## 2015-07-08 DIAGNOSIS — F32A Depression, unspecified: Secondary | ICD-10-CM

## 2015-07-08 MED ORDER — DULOXETINE HCL 60 MG PO CPEP: ORAL_CAPSULE | ORAL | Status: DC

## 2015-07-08 NOTE — Telephone Encounter (Signed)
Pt needing refill on Cymbalta. She said that she is out and has b

## 2015-07-08 NOTE — Telephone Encounter (Signed)
Pharmacy: [facility]  Reason for call: Pt is needing 30 day sent to [facility] of the Cymbalta. She is out and has been for a couple of days. She wants refills sent for 90 day supply to [facility].

## 2015-07-08 NOTE — Telephone Encounter (Signed)
VM left making the patient aware.      KP

## 2015-07-08 NOTE — Telephone Encounter (Signed)
30 sent to [facility] and 90 sent to [facility].     KP

## 2015-07-09 ENCOUNTER — Other Ambulatory Visit (HOSPITAL_BASED_OUTPATIENT_CLINIC_OR_DEPARTMENT_OTHER): Payer: Medicare Other

## 2015-07-09 ENCOUNTER — Other Ambulatory Visit: Payer: Self-pay

## 2015-07-09 ENCOUNTER — Ambulatory Visit (HOSPITAL_BASED_OUTPATIENT_CLINIC_OR_DEPARTMENT_OTHER): Payer: Medicare Other

## 2015-07-09 VITALS — BP 123/65 | HR 71 | Temp 97.8°F | Resp 16

## 2015-07-09 DIAGNOSIS — C7989 Secondary malignant neoplasm of other specified sites: Secondary | ICD-10-CM | POA: Diagnosis not present

## 2015-07-09 DIAGNOSIS — M810 Age-related osteoporosis without current pathological fracture: Secondary | ICD-10-CM

## 2015-07-09 DIAGNOSIS — C50911 Malignant neoplasm of unspecified site of right female breast: Secondary | ICD-10-CM

## 2015-07-09 DIAGNOSIS — Z5111 Encounter for antineoplastic chemotherapy: Secondary | ICD-10-CM

## 2015-07-09 DIAGNOSIS — G8929 Other chronic pain: Secondary | ICD-10-CM

## 2015-07-09 DIAGNOSIS — M5136 Other intervertebral disc degeneration, lumbar region: Secondary | ICD-10-CM

## 2015-07-09 DIAGNOSIS — C50919 Malignant neoplasm of unspecified site of unspecified female breast: Secondary | ICD-10-CM | POA: Diagnosis not present

## 2015-07-09 DIAGNOSIS — Z853 Personal history of malignant neoplasm of breast: Secondary | ICD-10-CM

## 2015-07-09 LAB — COMPREHENSIVE METABOLIC PANEL (CC13)
ALBUMIN: 3.8 g/dL (ref 3.5–5.0)
ALK PHOS: 54 U/L (ref 40–150)
ALT: 9 U/L (ref 0–55)
ANION GAP: 7 meq/L (ref 3–11)
AST: 16 U/L (ref 5–34)
BILIRUBIN TOTAL: 0.39 mg/dL (ref 0.20–1.20)
BUN: 14.6 mg/dL (ref 7.0–26.0)
CALCIUM: 9.3 mg/dL (ref 8.4–10.4)
CO2: 30 meq/L — AB (ref 22–29)
CREATININE: 0.7 mg/dL (ref 0.6–1.1)
Chloride: 102 mEq/L (ref 98–109)
EGFR: 76 mL/min/{1.73_m2} — AB (ref >90)
Glucose: 94 mg/dl (ref 70–140)
Potassium: 4.5 mEq/L (ref 3.5–5.1)
Sodium: 139 mEq/L (ref 136–145)
TOTAL PROTEIN: 6.5 g/dL (ref 6.4–8.3)

## 2015-07-09 LAB — CBC WITH DIFFERENTIAL/PLATELET
BASO%: 0.5 % (ref 0.0–2.0)
Basophils Absolute: 0 10*3/uL (ref 0.0–0.1)
EOS ABS: 0.1 10*3/uL (ref 0.0–0.5)
EOS%: 2.1 % (ref 0.0–7.0)
HEMATOCRIT: 39.6 % (ref 34.8–46.6)
HGB: 13.2 g/dL (ref 11.6–15.9)
LYMPH#: 2.1 10*3/uL (ref 0.9–3.3)
LYMPH%: 37.6 % (ref 14.0–49.7)
MCH: 31.2 pg (ref 25.1–34.0)
MCHC: 33.3 g/dL (ref 31.5–36.0)
MCV: 93.6 fL (ref 79.5–101.0)
MONO#: 0.5 10*3/uL (ref 0.1–0.9)
MONO%: 9.2 % (ref 0.0–14.0)
NEUT%: 50.6 % (ref 38.4–76.8)
NEUTROS ABS: 2.9 10*3/uL (ref 1.5–6.5)
PLATELETS: 214 10*3/uL (ref 145–400)
RBC: 4.23 10*6/uL (ref 3.70–5.45)
RDW: 12.8 % (ref 11.2–14.5)
WBC: 5.7 10*3/uL (ref 3.9–10.3)

## 2015-07-09 MED ORDER — OXYCODONE-ACETAMINOPHEN 10-325 MG PO TABS: 1.0000 | ORAL_TABLET | ORAL | Status: DC | PRN

## 2015-07-09 MED ORDER — FULVESTRANT 250 MG/5ML IM SOLN
500.0000 mg | Freq: Once | INTRAMUSCULAR | Status: AC
  Administered 2015-07-09: 500 mg via INTRAMUSCULAR
  Filled 2015-07-09: qty 10

## 2015-07-10 LAB — CANCER ANTIGEN 27.29: CA 27.29: 13 U/mL (ref 0–39)

## 2015-07-17 DIAGNOSIS — H2513 Age-related nuclear cataract, bilateral: Secondary | ICD-10-CM | POA: Diagnosis not present

## 2015-07-17 DIAGNOSIS — Z83518 Family history of other specified eye disorder: Secondary | ICD-10-CM | POA: Diagnosis not present

## 2015-07-17 DIAGNOSIS — H35371 Puckering of macula, right eye: Secondary | ICD-10-CM | POA: Diagnosis not present

## 2015-07-17 DIAGNOSIS — Z9013 Acquired absence of bilateral breasts and nipples: Secondary | ICD-10-CM | POA: Diagnosis not present

## 2015-07-17 DIAGNOSIS — H2512 Age-related nuclear cataract, left eye: Secondary | ICD-10-CM | POA: Insufficient documentation

## 2015-07-17 DIAGNOSIS — Z9889 Other specified postprocedural states: Secondary | ICD-10-CM | POA: Diagnosis not present

## 2015-07-17 DIAGNOSIS — Z90710 Acquired absence of both cervix and uterus: Secondary | ICD-10-CM | POA: Diagnosis not present

## 2015-07-17 DIAGNOSIS — Z79899 Other long term (current) drug therapy: Secondary | ICD-10-CM | POA: Diagnosis not present

## 2015-07-17 DIAGNOSIS — H43812 Vitreous degeneration, left eye: Secondary | ICD-10-CM | POA: Diagnosis not present

## 2015-07-17 DIAGNOSIS — Z91048 Other nonmedicinal substance allergy status: Secondary | ICD-10-CM | POA: Diagnosis not present

## 2015-07-17 DIAGNOSIS — H35341 Macular cyst, hole, or pseudohole, right eye: Secondary | ICD-10-CM | POA: Diagnosis not present

## 2015-07-17 DIAGNOSIS — Z880 Allergy status to penicillin: Secondary | ICD-10-CM | POA: Diagnosis not present

## 2015-07-17 DIAGNOSIS — H35351 Cystoid macular degeneration, right eye: Secondary | ICD-10-CM | POA: Diagnosis not present

## 2015-07-17 DIAGNOSIS — Z7951 Long term (current) use of inhaled steroids: Secondary | ICD-10-CM | POA: Diagnosis not present

## 2015-07-17 DIAGNOSIS — H1789 Other corneal scars and opacities: Secondary | ICD-10-CM | POA: Diagnosis not present

## 2015-07-17 DIAGNOSIS — Z7982 Long term (current) use of aspirin: Secondary | ICD-10-CM | POA: Diagnosis not present

## 2015-07-17 DIAGNOSIS — Z79891 Long term (current) use of opiate analgesic: Secondary | ICD-10-CM | POA: Diagnosis not present

## 2015-07-17 DIAGNOSIS — Z83511 Family history of glaucoma: Secondary | ICD-10-CM | POA: Diagnosis not present

## 2015-07-23 ENCOUNTER — Emergency Department (HOSPITAL_COMMUNITY): Payer: Medicare Other

## 2015-07-23 ENCOUNTER — Encounter (HOSPITAL_COMMUNITY): Payer: Self-pay | Admitting: Radiology

## 2015-07-23 ENCOUNTER — Observation Stay (HOSPITAL_COMMUNITY)
Admission: EM | Admit: 2015-07-23 | Discharge: 2015-07-24 | Disposition: A | Discharge status: Home / Self Care | Payer: Medicare Other | Attending: Internal Medicine | Admitting: Internal Medicine

## 2015-07-23 DIAGNOSIS — R079 Chest pain, unspecified: Secondary | ICD-10-CM | POA: Diagnosis not present

## 2015-07-23 DIAGNOSIS — Z791 Long term (current) use of non-steroidal anti-inflammatories (NSAID): Secondary | ICD-10-CM | POA: Diagnosis not present

## 2015-07-23 DIAGNOSIS — G8929 Other chronic pain: Secondary | ICD-10-CM | POA: Diagnosis not present

## 2015-07-23 DIAGNOSIS — M1990 Unspecified osteoarthritis, unspecified site: Secondary | ICD-10-CM | POA: Insufficient documentation

## 2015-07-23 DIAGNOSIS — Z923 Personal history of irradiation: Secondary | ICD-10-CM | POA: Diagnosis not present

## 2015-07-23 DIAGNOSIS — Z79891 Long term (current) use of opiate analgesic: Secondary | ICD-10-CM | POA: Diagnosis not present

## 2015-07-23 DIAGNOSIS — D638 Anemia in other chronic diseases classified elsewhere: Secondary | ICD-10-CM | POA: Diagnosis present

## 2015-07-23 DIAGNOSIS — F329 Major depressive disorder, single episode, unspecified: Secondary | ICD-10-CM | POA: Insufficient documentation

## 2015-07-23 DIAGNOSIS — J449 Chronic obstructive pulmonary disease, unspecified: Secondary | ICD-10-CM | POA: Diagnosis not present

## 2015-07-23 DIAGNOSIS — R0789 Other chest pain: Secondary | ICD-10-CM | POA: Diagnosis not present

## 2015-07-23 DIAGNOSIS — F419 Anxiety disorder, unspecified: Secondary | ICD-10-CM | POA: Diagnosis not present

## 2015-07-23 DIAGNOSIS — I10 Essential (primary) hypertension: Secondary | ICD-10-CM | POA: Diagnosis not present

## 2015-07-23 DIAGNOSIS — Z79811 Long term (current) use of aromatase inhibitors: Secondary | ICD-10-CM | POA: Diagnosis not present

## 2015-07-23 DIAGNOSIS — J45909 Unspecified asthma, uncomplicated: Secondary | ICD-10-CM | POA: Diagnosis not present

## 2015-07-23 DIAGNOSIS — Z79899 Other long term (current) drug therapy: Secondary | ICD-10-CM | POA: Insufficient documentation

## 2015-07-23 DIAGNOSIS — Z7982 Long term (current) use of aspirin: Secondary | ICD-10-CM | POA: Insufficient documentation

## 2015-07-23 DIAGNOSIS — H269 Unspecified cataract: Secondary | ICD-10-CM | POA: Diagnosis not present

## 2015-07-23 DIAGNOSIS — Z7951 Long term (current) use of inhaled steroids: Secondary | ICD-10-CM | POA: Diagnosis not present

## 2015-07-23 DIAGNOSIS — Z853 Personal history of malignant neoplasm of breast: Secondary | ICD-10-CM | POA: Insufficient documentation

## 2015-07-23 DIAGNOSIS — C50911 Malignant neoplasm of unspecified site of right female breast: Secondary | ICD-10-CM | POA: Diagnosis present

## 2015-07-23 DIAGNOSIS — K210 Gastro-esophageal reflux disease with esophagitis: Secondary | ICD-10-CM | POA: Diagnosis not present

## 2015-07-23 DIAGNOSIS — J4520 Mild intermittent asthma, uncomplicated: Secondary | ICD-10-CM

## 2015-07-23 DIAGNOSIS — I2510 Atherosclerotic heart disease of native coronary artery without angina pectoris: Secondary | ICD-10-CM | POA: Insufficient documentation

## 2015-07-23 DIAGNOSIS — F418 Other specified anxiety disorders: Secondary | ICD-10-CM

## 2015-07-23 DIAGNOSIS — Z8589 Personal history of malignant neoplasm of other organs and systems: Secondary | ICD-10-CM | POA: Diagnosis not present

## 2015-07-23 DIAGNOSIS — E785 Hyperlipidemia, unspecified: Secondary | ICD-10-CM | POA: Diagnosis present

## 2015-07-23 DIAGNOSIS — R0602 Shortness of breath: Secondary | ICD-10-CM | POA: Diagnosis not present

## 2015-07-23 LAB — CBC
HCT: 35.5 % — ABNORMAL LOW (ref 36.0–46.0)
HEMOGLOBIN: 11.8 g/dL — AB (ref 12.0–15.0)
MCH: 30.6 pg (ref 26.0–34.0)
MCHC: 33.2 g/dL (ref 30.0–36.0)
MCV: 92 fL (ref 78.0–100.0)
PLATELETS: 200 10*3/uL (ref 150–400)
RBC: 3.86 MIL/uL — AB (ref 3.87–5.11)
RDW: 12.6 % (ref 11.5–15.5)
WBC: 5.2 10*3/uL (ref 4.0–10.5)

## 2015-07-23 LAB — COMPREHENSIVE METABOLIC PANEL
ALBUMIN: 3.7 g/dL (ref 3.5–5.0)
ALK PHOS: 43 U/L (ref 38–126)
ALT: 10 U/L — AB (ref 14–54)
AST: 22 U/L (ref 15–41)
Anion gap: 6 (ref 5–15)
BUN: 12 mg/dL (ref 6–20)
CALCIUM: 8.7 mg/dL — AB (ref 8.9–10.3)
CHLORIDE: 104 mmol/L (ref 101–111)
CO2: 27 mmol/L (ref 22–32)
CREATININE: 0.63 mg/dL (ref 0.44–1.00)
GFR calc non Af Amer: >60 mL/min (ref >60)
GLUCOSE: 93 mg/dL (ref 65–99)
Potassium: 4.1 mmol/L (ref 3.5–5.1)
SODIUM: 137 mmol/L (ref 135–145)
Total Bilirubin: 0.6 mg/dL (ref 0.3–1.2)
Total Protein: 6.2 g/dL — ABNORMAL LOW (ref 6.5–8.1)

## 2015-07-23 LAB — I-STAT TROPONIN, ED: Troponin i, poc: 0.01 ng/mL (ref 0.00–0.08)

## 2015-07-23 LAB — PROTIME-INR
INR: 1.07 (ref 0.00–1.49)
Prothrombin Time: 14.1 seconds (ref 11.6–15.2)

## 2015-07-23 LAB — APTT: APTT: 29 s (ref 24–37)

## 2015-07-23 LAB — TROPONIN I

## 2015-07-23 MED ORDER — FISH OIL OIL: 1.0000 | TOPICAL_OIL | Freq: Every day | Status: DC

## 2015-07-23 MED ORDER — NITROGLYCERIN 2 % TD OINT
1.0000 [in_us] | TOPICAL_OINTMENT | Freq: Once | TRANSDERMAL | Status: AC
  Administered 2015-07-23: 1 [in_us] via TOPICAL
  Filled 2015-07-23: qty 30

## 2015-07-23 MED ORDER — SODIUM CHLORIDE 0.9 % IV SOLN
1000.0000 mL | INTRAVENOUS | Status: DC
  Administered 2015-07-23: 1000 mL via INTRAVENOUS

## 2015-07-23 MED ORDER — DULOXETINE HCL 60 MG PO CPEP
60.0000 mg | ORAL_CAPSULE | Freq: Every day | ORAL | Status: DC
  Administered 2015-07-24: 60 mg via ORAL
  Filled 2015-07-23: qty 1

## 2015-07-23 MED ORDER — MORPHINE SULFATE (PF) 4 MG/ML IV SOLN
4.0000 mg | Freq: Once | INTRAVENOUS | Status: AC
  Administered 2015-07-23: 4 mg via INTRAVENOUS
  Filled 2015-07-23: qty 1

## 2015-07-23 MED ORDER — ISOSORBIDE MONONITRATE ER 30 MG PO TB24
30.0000 mg | ORAL_TABLET | Freq: Every day | ORAL | Status: DC
  Administered 2015-07-24: 30 mg via ORAL
  Filled 2015-07-23: qty 1

## 2015-07-23 MED ORDER — CALCIUM-MAGNESIUM-ZINC 1000-400-15 MG PO TABS: 1.0000 | ORAL_TABLET | Freq: Every day | ORAL | Status: DC

## 2015-07-23 MED ORDER — ADULT MULTIVITAMIN W/MINERALS CH
1.0000 | ORAL_TABLET | Freq: Every day | ORAL | Status: DC
  Administered 2015-07-24: 1 via ORAL
  Filled 2015-07-23: qty 1

## 2015-07-23 MED ORDER — IOHEXOL 350 MG/ML SOLN
100.0000 mL | Freq: Once | INTRAVENOUS | Status: AC | PRN
  Administered 2015-07-23: 75 mL via INTRAVENOUS

## 2015-07-23 MED ORDER — ZOLPIDEM TARTRATE 5 MG PO TABS
5.0000 mg | ORAL_TABLET | Freq: Once | ORAL | Status: AC
  Administered 2015-07-23: 5 mg via ORAL
  Filled 2015-07-23: qty 1

## 2015-07-23 MED ORDER — ALBUTEROL SULFATE (2.5 MG/3ML) 0.083% IN NEBU: 2.5000 mg | INHALATION_SOLUTION | Freq: Four times a day (QID) | RESPIRATORY_TRACT | Status: DC | PRN

## 2015-07-23 MED ORDER — ONDANSETRON 4 MG PO TBDP
4.0000 mg | ORAL_TABLET | Freq: Three times a day (TID) | ORAL | Status: DC | PRN
  Administered 2015-07-24: 4 mg via ORAL
  Filled 2015-07-23: qty 1

## 2015-07-23 MED ORDER — DIAZEPAM 5 MG PO TABS
5.0000 mg | ORAL_TABLET | Freq: Three times a day (TID) | ORAL | Status: DC | PRN
  Administered 2015-07-23 – 2015-07-24 (×2): 5 mg via ORAL
  Filled 2015-07-23 (×2): qty 1

## 2015-07-23 MED ORDER — ONDANSETRON HCL 4 MG/2ML IJ SOLN: 4.0000 mg | Freq: Four times a day (QID) | INTRAMUSCULAR | Status: DC | PRN

## 2015-07-23 MED ORDER — ENOXAPARIN SODIUM 30 MG/0.3ML ~~LOC~~ SOLN
30.0000 mg | SUBCUTANEOUS | Status: DC
  Administered 2015-07-23: 30 mg via SUBCUTANEOUS
  Filled 2015-07-23: qty 0.3

## 2015-07-23 MED ORDER — LETROZOLE 2.5 MG PO TABS
2.5000 mg | ORAL_TABLET | Freq: Every day | ORAL | Status: DC
  Administered 2015-07-24: 2.5 mg via ORAL
  Filled 2015-07-23: qty 1

## 2015-07-23 MED ORDER — EZETIMIBE 10 MG PO TABS
10.0000 mg | ORAL_TABLET | Freq: Every day | ORAL | Status: DC
  Administered 2015-07-24: 10 mg via ORAL
  Filled 2015-07-23: qty 1

## 2015-07-23 MED ORDER — BIOTIN 5000 MCG PO CAPS: 5000.0000 ug | ORAL_CAPSULE | Freq: Every day | ORAL | Status: DC

## 2015-07-23 MED ORDER — NITROGLYCERIN 0.4 MG SL SUBL: 0.4000 mg | SUBLINGUAL_TABLET | SUBLINGUAL | Status: DC | PRN

## 2015-07-23 MED ORDER — OXYCODONE-ACETAMINOPHEN 5-325 MG PO TABS
1.0000 | ORAL_TABLET | ORAL | Status: DC | PRN
Start: 2015-07-23 — End: 2015-07-24
  Administered 2015-07-23 – 2015-07-24 (×4): 1 via ORAL
  Filled 2015-07-23 (×4): qty 1

## 2015-07-23 MED ORDER — ASPIRIN EC 81 MG PO TBEC
162.0000 mg | DELAYED_RELEASE_TABLET | Freq: Every day | ORAL | Status: DC
  Administered 2015-07-24: 162 mg via ORAL
  Filled 2015-07-23: qty 2

## 2015-07-23 MED ORDER — CITALOPRAM HYDROBROMIDE 20 MG PO TABS
40.0000 mg | ORAL_TABLET | Freq: Every day | ORAL | Status: DC
  Administered 2015-07-24: 40 mg via ORAL
  Filled 2015-07-23: qty 2

## 2015-07-23 MED ORDER — CALCIUM CARBONATE 1250 (500 CA) MG PO TABS
1.0000 | ORAL_TABLET | Freq: Every day | ORAL | Status: DC
  Administered 2015-07-24: 500 mg via ORAL
  Filled 2015-07-23: qty 1

## 2015-07-23 MED ORDER — VITAMIN D3 25 MCG (1000 UNIT) PO TABS
5000.0000 [IU] | ORAL_TABLET | Freq: Every day | ORAL | Status: DC
  Administered 2015-07-24: 5000 [IU] via ORAL
  Filled 2015-07-23 (×2): qty 5

## 2015-07-23 MED ORDER — MONTELUKAST SODIUM 10 MG PO TABS
10.0000 mg | ORAL_TABLET | Freq: Every day | ORAL | Status: DC
  Administered 2015-07-23: 10 mg via ORAL
  Filled 2015-07-23: qty 1

## 2015-07-23 MED ORDER — ACETAMINOPHEN 325 MG PO TABS: 650.0000 mg | ORAL_TABLET | ORAL | Status: DC | PRN

## 2015-07-23 MED ORDER — SODIUM CHLORIDE 0.9 % IV SOLN
INTRAVENOUS | Status: DC
  Administered 2015-07-23: 19:00:00 via INTRAVENOUS

## 2015-07-23 MED ORDER — OXYCODONE-ACETAMINOPHEN 10-325 MG PO TABS: 1.0000 | ORAL_TABLET | ORAL | Status: DC | PRN

## 2015-07-23 MED ORDER — OXYCODONE HCL 5 MG PO TABS
5.0000 mg | ORAL_TABLET | ORAL | Status: DC | PRN
  Administered 2015-07-23 – 2015-07-24 (×4): 5 mg via ORAL
  Filled 2015-07-23 (×4): qty 1

## 2015-07-23 MED ORDER — OMEGA-3-ACID ETHYL ESTERS 1 G PO CAPS
1.0000 g | ORAL_CAPSULE | Freq: Every day | ORAL | Status: DC
Start: 2015-07-24 — End: 2015-07-24
  Administered 2015-07-24: 1 g via ORAL
  Filled 2015-07-23: qty 1

## 2015-07-23 MED ORDER — MAGNESIUM OXIDE 400 (241.3 MG) MG PO TABS
400.0000 mg | ORAL_TABLET | Freq: Every day | ORAL | Status: DC
Start: 2015-07-24 — End: 2015-07-24
  Administered 2015-07-24: 400 mg via ORAL
  Filled 2015-07-23: qty 1

## 2015-07-23 NOTE — ED Notes (Signed)
Per [facility] pt was shopping at [place] when started having new onset chest pain and shortness of breath. Hx angina. Pt received 325 aspirin from [facility]. Pt also reports possible hypoglycemia, yet CBG by  EMS was 139. Pt received 1 nitro by EMS , BP dropped to 90/62. Chest pain decreased to 2/10. Pt alert and oriented x 4. Per ems pt has unremarkable EKG. Pt also co  Bilateral shoulder and back pain.

## 2015-07-23 NOTE — ED Notes (Signed)
Blood draw unsuccessful RN notified. 

## 2015-07-23 NOTE — ED Provider Notes (Signed)
CSN: [id number]     Arrival date & time 07/23/15  1305 History   First MD Initiated Contact with Patient 07/23/15 1311     Chief Complaint  Patient presents with  . Chest Pain   HPI Pt had sudden onset of chest pain and shortness of breath.  She was at [facility] shopping when this suddenly happened.  She felt like she was being smothered.  It felt like a heaviness on her chest.  She also felt short of breath.  She was given oxygen and aspirin by EMS with some improvement.   She has a history of recurrent metastatic breast CA.  She has been getting  Chemo injections, Faslodex. She also has history of coronary artery disease with her last cath showing nonobstructive disease in 2012. Past Medical History  Diagnosis Date  . Asthma   . Spinal stenosis   . Osteoporosis   . Chronic bronchitis   . DDD (degenerative disc disease)   . Osteoarthritis   . Degenerative joint disease   . Coronary artery disease Non-obstructive    a. 2012 Cath: LAD 40-60m->Med Rx;  b. 11/2013 Echo: EF 50-55%, nl wall motion;  c. 12/2013 Lexi MV: EF 74%, no ischemia.  . Depression   . Anxiety   . COPD (chronic obstructive pulmonary disease)   . Dyslipidemia   . Cholelithiasis   . GERD (gastroesophageal reflux disease)   . Hx of radiation therapy 07/02/1999- 08/11/1999    right supraclavucular/axillary region: 5040 cGy, 28 fractions  . Hx of radiation therapy 02/20/14- 03/27/14    right chest wall 4500 cGy 25 sessions  . Hypertension   . Kidney stones     one stones  . Cataract     eye surgery planned for 11/2014  . PVC's (premature ventricular contractions)   . Reflux esophagitis   . Pre-diabetes   . Skin cancer of face     non melanoma  . Breast cancer 05/07/1999, 06/2011    a. s/p bilat mastectomies. recurrence 01/2014. Radiation, meds.  . Chest wall recurrence of breast cancer 2016  . Cancer of chest (wall)    Past Surgical History  Procedure Laterality Date  . Vesicovaginal fistula closure w/ tah  age 18  .  Tubal ligation  1961  . Appendectomy    . Abdominal hysterectomy      in her 30's  . Small intestine surgery  1998    SBO  . Tonsillectomy    . Breast lumpectomy  2002    right breast  . Mastectomy  07/29/11    bilateral-rt nodes-none on left  . Cardiac catheterization  03/2011, 3/15  . Breast biopsy Right 01/21/2014    Procedure: BREAST/CHEST WALL BIOPSY;  Surgeon: [name], MD;  Location: [place];  Service: General;  Laterality: Right;  . Vein ligation and stripping Right   . Left heart catheterization with coronary angiogram N/A 12/05/2013    Procedure: LEFT HEART CATHETERIZATION WITH CORONARY ANGIOGRAM;  Surgeon: [name], MD;  Location: [facility] CATH LAB;  Service: Cardiovascular;  Laterality: N/A;  . Left heart catheterization with coronary angiogram N/A 11/06/2014    Procedure: LEFT HEART CATHETERIZATION WITH CORONARY ANGIOGRAM;  Surgeon: [name], MD;  Location: [facility] CATH LAB;  Service: Cardiovascular;  Laterality: N/A;   Family History  Problem Relation Age of Onset  . Pancreatic cancer Mother   . Cancer Mother     pancreatic  . Asthma Brother   . Asthma Daughter   .  Stroke Daughter   . Asthma Grandchild   . Asthma Brother     deceased  . Heart disease Father   . Emphysema Father   . Heart disease Brother   . Lymphoma Brother    Social History  Substance Use Topics  . Smoking status: Never Smoker   . Smokeless tobacco: Never Used  . Alcohol Use: Yes     Comment: occ wine   OB History    No data available     Review of Systems  All other systems reviewed and are negative.     Allergies  Penicillins and Adhesive  Home Medications   Prior to Admission medications   Medication Sig Start Date End Date Taking? Authorizing Provider  albuterol (PROVENTIL HFA;VENTOLIN HFA) 108 (90 BASE) MCG/ACT inhaler Inhale 2 puffs into the lungs every 6 (six) hours as needed for wheezing or shortness of breath. 04/25/15   [name], DO  aspirin EC 81  MG tablet Take 2 tablets (162 mg total) by mouth daily. 05/14/15   [name], MD  B Complex-Biotin-FA (B-COMPLEX PO) Take 1 tablet by mouth daily.     Historical Provider, MD  Biotin 5000 MCG CAPS Take 5,000 mcg by mouth daily.    Historical Provider, MD  budesonide-formoterol (SYMBICORT) 160-4.5 MCG/ACT inhaler Inhale 2 puffs into the lungs 2 (two) times daily as needed. 04/25/15   [name], DO  Calcium-Magnesium-Zinc 1000-400-15 MG TABS Take 1 tablet by mouth daily.      Historical Provider, MD  celecoxib (CELEBREX) 200 MG capsule Take 1 capsule daily with food for pain & inflammation 04/25/15   [name], DO  Cholecalciferol (VITAMIN D-3) 5000 UNITS TABS Take 1 tablet by mouth daily.     Historical Provider, MD  citalopram (CELEXA) 40 MG tablet Take 1 tablet (40 mg total) by mouth daily. 06/13/15   Alferd Apa Lowne, DO  diazepam (VALIUM) 5 MG tablet TAKE ONE-HALF TO ONE TABLET BY MOUTH EVERY 8 HOURS AS NEEDED FOR ANXIETY 07/07/15   [name], DO  DULoxetine (CYMBALTA) 60 MG capsule Take 1 capsule by mouth  every day for mood 07/08/15   [name], DO  ezetimibe (ZETIA) 10 MG tablet Take 1 tablet (10 mg total) by mouth daily. 04/25/15   [name], DO  Fish Oil OIL Take 1 capsule by mouth daily.     Historical Provider, MD  Flaxseed, Linseed, (GROUND FLAX SEEDS PO) Take 5 mLs by mouth daily.     Historical Provider, MD  HYDROcodone-acetaminophen (NORCO) 5-325 MG per tablet Take 1 tablet by mouth every 6 (six) hours as needed for moderate pain or severe pain. Patient taking differently: Take 1 tablet by mouth every 4 (four) hours as needed for moderate pain or severe pain.  02/15/15   Orlie Dakin, MD  isosorbide mononitrate (IMDUR) 30 MG 24 hr tablet Take 1 tablet (30 mg total) by mouth daily. 04/25/15   [name], DO  letrozole (FEMARA) 2.5 MG tablet Take 1 tablet (2.5 mg total) by mouth daily. 06/05/15   [name], MD  montelukast (SINGULAIR) 10 MG tablet Take  1 tablet (10 mg total) by mouth at bedtime. 04/25/15   [name], DO  Multiple Vitamin (MULTIVITAMIN) tablet Take 1 tablet by mouth daily.    Historical Provider, MD  nitroGLYCERIN (NITROSTAT) 0.4 MG SL tablet Place 1 tablet (0.4 mg total) under the tongue every 5 (five) minutes x 3 doses as  needed for chest pain. 04/25/15   Alferd Apa Lowne, DO  ondansetron (ZOFRAN ODT) 4 MG disintegrating tablet Take 1 tablet (4 mg total) by mouth every 8 (eight) hours as needed for nausea or vomiting. 12/06/14   Pamella Pert, MD  oxyCODONE (ROXICODONE) 5 MG immediate release tablet Take 1 tablet (5 mg total) by mouth every 4 (four) hours as needed for severe pain. 06/11/15   [name], MD  oxyCODONE-acetaminophen (PERCOCET) 10-325 MG tablet Take 1 tablet by mouth every 4 (four) hours as needed for pain. 07/09/15   [name], MD   BP 110/69 mmHg  Pulse 61  Temp(Src) 97.6 F (36.4 C) (Oral)  Resp 19  Ht 5\' 2"  (1.575 m)  Wt 122 lb (55.339 kg)  BMI 22.31 kg/m2  SpO2 100% Physical Exam  Constitutional: She appears well-developed and well-nourished.  HENT:  Head: Normocephalic and atraumatic.  Right Ear: External ear normal.  Left Ear: External ear normal.  Eyes: Conjunctivae are normal. Right eye exhibits no discharge. Left eye exhibits no discharge. No scleral icterus.  Neck: Neck supple. No tracheal deviation present.  Cardiovascular: Normal rate, regular rhythm and intact distal pulses.   Pulmonary/Chest: Effort normal and breath sounds normal. No stridor. No respiratory distress. She has no wheezes. She has no rales.  Abdominal: Soft. Bowel sounds are normal. She exhibits no distension. There is no tenderness. There is no rebound and no guarding.  Musculoskeletal: She exhibits edema (trace edema of her lower extremities). She exhibits no tenderness.  Neurological: She is alert. She has normal strength. No cranial nerve deficit (no facial droop, extraocular movements intact, no slurred  speech) or sensory deficit. She exhibits normal muscle tone. She displays no seizure activity. Coordination normal.  Skin: Skin is warm. No rash noted. She is not diaphoretic.  Psychiatric: She has a normal mood and affect.  Nursing note and vitals reviewed.   ED Course  Procedures (including critical care time) Labs Review Labs Reviewed  CBC - Abnormal; Notable for the following:    RBC 3.86 (*)    Hemoglobin 11.8 (*)    HCT 35.5 (*)    All other components within normal limits  COMPREHENSIVE METABOLIC PANEL - Abnormal; Notable for the following:    Calcium 8.7 (*)    Total Protein 6.2 (*)    ALT 10 (*)    All other components within normal limits  APTT  PROTIME-INR  I-STAT TROPOININ, ED    Imaging Review Dg Chest 2 View  07/23/2015  CLINICAL DATA:  Chest pain and shortness of breath ; weakness for 3 days EXAM: CHEST  2 VIEW COMPARISON:  Chest radiograph and chest CT Feb 14, 2015 FINDINGS: Elevation of the right hemidiaphragm is stable. There is no appreciable edema or consolidation. Heart size and pulmonary vascularity are normal. No adenopathy. Surgical clips the right axillary region are again noted. There is atherosclerotic calcification in the aorta. Bones appear osteoporotic. IMPRESSION: No edema or consolidation. Stable elevation right hemidiaphragm. No change in cardiac silhouette. Electronically Signed   By: Lowella Grip III M.D.   On: 07/23/2015 14:36   I have personally reviewed and evaluated these images and lab results as part of my medical decision-making.   EKG Interpretation   Date/Time:  Wednesday July 23 2015 13:33:24 EDT Ventricular Rate:  67 PR Interval:  170 QRS Duration: 64 QT Interval:  421 QTC Calculation: 444 R Axis:   40 Text Interpretation:  Sinus rhythm Abnormal R-wave progression, early  transition Early transition is new since last tracing but similar to prior  EGKS on file (05 May 1999) Confirmed by [facility]  MD-J, Khori Underberg [phone]) on   07/23/2015 1:45:26 PM     Medications  0.9 %  sodium chloride infusion (1,000 mLs Intravenous New Bag/Given 07/23/15 1448)  nitroGLYCERIN (NITROGLYN) 2 % ointment 1 inch (1 inch Topical Given 07/23/15 1359)  morphine 4 MG/ML injection 4 mg (4 mg Intravenous Given 07/23/15 1359)    MDM   Final diagnoses:  Chest pain, unspecified chest pain type   Patient's initial laboratory tests are reassuring. Troponin is not elevated. CBC M.D. med are unremarkable. Chest x-ray does not show evidence of pneumonia.  Patient has a history of coronary artery disease but no significant obstructions in the past.  Considering her history of malignancy, I will add on a chest CT to evaluate for pulmonary embolism.  I will consult the medical service for admission, observation, serial enzymes.     Dorie Rank, MD 07/23/15 [phone]

## 2015-07-23 NOTE — H&P (Signed)
[facility] History and Physical  Angel [name] HQP:[id number] DOB: 08-18-1932 DOA: 07/23/2015  Referring physician: ER physician: Dr. Marye Round  PCP: [name], DO  Chief Complaint: chest pain   HPI:  81 year old very pleasant female with past medical history of asthma, anxiety, hypertension, breast cancer (on Letrozole), dyslipidemia who presented to [facility] ED with reports of chest pain in mid sternal area, presented as heaviness especially with breathing although pain was present at rest as well. Pain was sharp, intermittent, non radiating, 10 out of 10 in intensity at worst and has gotten better with nitroglycerin. No fevers, no chills. No abdominal pain, nausea or vomiting. No blood in stool or urine. No lightheadedness or loss of consciousness.   In ED, pt was hemodynamically stable. Blood work was essentially unremarkable except for hemoglobin of 11.8. The first troponin level was WNL.The 12 lead EKG showed sinus rhythm, Pt admitted for chest pain rule out.   Assessment & Plan    Principal Problem:   Chest pain / CAD (coronary artery disease) - Heart score of 5 - CT angio chest showed no pulmonary embolism - CXR showed no acute cardiopulmonary findings  - Cycle cardiac enzymes - 2 D ECHO in 11/2013 showed EF of 55% - Continue daily aspirin - Continue statin therapy  - Use nitroglycerin PRN for chest pain - Monitor on telemetry  Active Problems:   Asthma - Stable - Not in acute exacerbation     Chronic pain - Resume as needed norco for pain control     Essential hypertension - Continue Imdur 30 mg daily - BP 113/59    Breast cancer, right breast (HCC) - Continue letrozole     Dyslipidemia - Continue zetia and fish oil     Anxiety and depression - stable - Continue valium, Cymbalta  and Celexa    Anemia of chronic disease - Secondary to history of malignancy - Hemoglobin stable at 11.8  DVT prophylaxis:  - Lovenox subQ   Radiological Exams  on Admission: Dg Chest 2 View 07/23/2015  No edema or consolidation. Stable elevation right hemidiaphragm. No change in cardiac silhouette. Electronically Signed   By: Lowella Grip III M.D.   On: 07/23/2015 14:36   Ct Angio Chest Pe W/cm &/or Wo Cm 07/23/2015  No evidence of pulmonary embolus. Atherosclerosis of thoracic aorta without aneurysm formation. Solitary gallstone seen in visualized portion of upper abdomen. Electronically Signed   By: Marijo Conception, M.D.   On: 07/23/2015 16:03    EKG: I have personally reviewed EKG. EKG shows sinus rhythm, poor R wave progresion   Code Status: Full Family Communication: Plan of care discussed with the patient  Disposition Plan: Admit for further evaluation, telemetry   Danel Studzinski, Dedra Skeens, MD  [facility] Pager [phone]  Time spent in minutes: 55 minutes  Review of Systems:  Constitutional: Negative for fever, chills and malaise/fatigue. Negative for diaphoresis.  HENT: Negative for hearing loss, ear pain, nosebleeds, congestion, sore throat, neck pain, tinnitus and ear discharge.   Eyes: Negative for blurred vision, double vision, photophobia, pain, discharge and redness.  Respiratory: Negative for cough, hemoptysis, sputum production, shortness of breath, wheezing and stridor.   Cardiovascular: per HPI.  Gastrointestinal: Negative for nausea, vomiting and abdominal pain. Negative for heartburn, constipation, blood in stool and melena.  Genitourinary: Negative for dysuria, urgency, frequency, hematuria and flank pain.  Musculoskeletal: Negative for myalgias, back pain, joint pain and falls.  Skin: Negative for itching and rash.  Neurological: Negative for dizziness and weakness. Negative for tingling, tremors, sensory change, speech change, focal weakness, loss of consciousness and headaches.  Endo/Heme/Allergies: Negative for environmental allergies and polydipsia. Does not bruise/bleed easily.  Psychiatric/Behavioral: Negative for  suicidal ideas. The patient is not nervous/anxious.      Past Medical History  Diagnosis Date  . Asthma   . Spinal stenosis   . Osteoporosis   . Chronic bronchitis   . DDD (degenerative disc disease)   . Osteoarthritis   . Degenerative joint disease   . Coronary artery disease Non-obstructive    a. 2012 Cath: LAD 40-60m->Med Rx;  b. 11/2013 Echo: EF 50-55%, nl wall motion;  c. 12/2013 Lexi MV: EF 74%, no ischemia.  . Depression   . Anxiety   . COPD (chronic obstructive pulmonary disease) ([place])   . Dyslipidemia   . Cholelithiasis   . GERD (gastroesophageal reflux disease)   . Hx of radiation therapy 07/02/1999- 08/11/1999    right supraclavucular/axillary region: 5040 cGy, 28 fractions  . Hx of radiation therapy 02/20/14- 03/27/14    right chest wall 4500 cGy 25 sessions  . Hypertension   . Kidney stones     one stones  . Cataract     eye surgery planned for 11/2014  . PVC's (premature ventricular contractions)   . Reflux esophagitis   . Pre-diabetes   . Skin cancer of face     non melanoma  . Breast cancer ([place]) 05/07/1999, 06/2011    a. s/p bilat mastectomies. recurrence 01/2014. Radiation, meds.  . Chest wall recurrence of breast cancer ([place]) 2016  . Cancer of chest (wall) [facility])    Past Surgical History  Procedure Laterality Date  . Vesicovaginal fistula closure w/ tah  age 38  . Tubal ligation  1961  . Appendectomy    . Abdominal hysterectomy      in her 30's  . Small intestine surgery  1998    SBO  . Tonsillectomy    . Breast lumpectomy  2002    right breast  . Mastectomy  07/29/11    bilateral-rt nodes-none on left  . Cardiac catheterization  03/2011, 3/15  . Breast biopsy Right 01/21/2014    Procedure: BREAST/CHEST WALL BIOPSY;  Surgeon: [name], MD;  Location: [place];  Service: General;  Laterality: Right;  . Vein ligation and stripping Right   . Left heart catheterization with coronary angiogram N/A 12/05/2013    Procedure: LEFT HEART CATHETERIZATION WITH  CORONARY ANGIOGRAM;  Surgeon: [name], MD;  Location: [facility] CATH LAB;  Service: Cardiovascular;  Laterality: N/A;  . Left heart catheterization with coronary angiogram N/A 11/06/2014    Procedure: LEFT HEART CATHETERIZATION WITH CORONARY ANGIOGRAM;  Surgeon: [name], MD;  Location: [facility] CATH LAB;  Service: Cardiovascular;  Laterality: N/A;   Social History:  reports that she has never smoked. She has never used smokeless tobacco. She reports that she drinks alcohol. She reports that she does not use illicit drugs.  Allergies  Allergen Reactions  . Penicillins Rash    Has patient had a PCN reaction causing immediate rash, facial/tongue/throat swelling, SOB or lightheadedness with hypotension: Yes Has patient had a PCN reaction causing severe rash involving mucus membranes or skin necrosis: No Has patient had a PCN reaction that required hospitalization No Has patient had a PCN reaction occurring within the last 10 years: No If all of the above answers are "NO", then may proceed with Cephalosporin use.   Dorma Russell [  Tape] Other (See Comments)    Pt prefers to use paper tape    Family History:  Family History  Problem Relation Age of Onset  . Pancreatic cancer Mother   . Cancer Mother     pancreatic  . Asthma Brother   . Asthma Daughter   . Stroke Daughter   . Asthma Grandchild   . Asthma Brother     deceased  . Heart disease Father   . Emphysema Father   . Heart disease Brother   . Lymphoma Brother      Prior to Admission medications   Medication Sig Start Date End Date Taking? Authorizing Provider  albuterol (PROVENTIL HFA;VENTOLIN HFA) 108 (90 BASE) MCG/ACT inhaler Inhale 2 puffs into the lungs every 6 (six) hours as needed for wheezing or shortness of breath. 04/25/15  Yes [name], DO  aspirin EC 81 MG tablet Take 2 tablets (162 mg total) by mouth daily. 05/14/15  Yes [name], MD  budesonide-formoterol [facility]) 160-4.5 MCG/ACT inhaler  Inhale 2 puffs into the lungs 2 (two) times daily as needed. Patient taking differently: Inhale 2 puffs into the lungs 2 (two) times daily as needed (shortness of breath).  04/25/15  Yes [name], DO  celecoxib (CELEBREX) 200 MG capsule Take 1 capsule daily with food for pain & inflammation 04/25/15  Yes Yvonne R Lowne, DO  citalopram (CELEXA) 40 MG tablet Take 1 tablet (40 mg total) by mouth daily. 06/13/15  Yes Yvonne R Lowne, DO  diazepam (VALIUM) 5 MG tablet TAKE ONE-HALF TO ONE TABLET BY MOUTH EVERY 8 HOURS AS NEEDED FOR ANXIETY 07/07/15  Yes [name], DO  DULoxetine (CYMBALTA) 60 MG capsule Take 1 capsule by mouth  every day for mood 07/08/15  Yes Alferd Apa Lowne, DO  ezetimibe (ZETIA) 10 MG tablet Take 1 tablet (10 mg total) by mouth daily. 04/25/15  Yes [name], DO  HYDROcodone-acetaminophen (NORCO) 5-325 MG per tablet Take 1 tablet by mouth every 6 (six) hours as needed for moderate pain or severe pain. Patient taking differently: Take 1 tablet by mouth every 4 (four) hours as needed for moderate pain or severe pain.  02/15/15  Yes Orlie Dakin, MD  isosorbide mononitrate (IMDUR) 30 MG 24 hr tablet Take 1 tablet (30 mg total) by mouth daily. 04/25/15  Yes [name], DO  letrozole (FEMARA) 2.5 MG tablet Take 1 tablet (2.5 mg total) by mouth daily. 06/05/15  Yes [name], MD  montelukast (SINGULAIR) 10 MG tablet Take 1 tablet (10 mg total) by mouth at bedtime. 04/25/15  Yes Yvonne R Lowne, DO  B Complex-Biotin-FA (B-COMPLEX PO) Take 1 tablet by mouth daily.     Historical Provider, MD  Biotin 5000 MCG CAPS Take 5,000 mcg by mouth daily.    Historical Provider, MD  Calcium-Magnesium-Zinc 1000-400-15 MG TABS Take 1 tablet by mouth daily.      Historical Provider, MD  Cholecalciferol (VITAMIN D-3) 5000 UNITS TABS Take 1 tablet by mouth daily.     Historical Provider, MD  Fish Oil OIL Take 1 capsule by mouth daily.     Historical Provider, MD  Flaxseed, Linseed, (GROUND  FLAX SEEDS PO) Take 5 mLs by mouth daily.     Historical Provider, MD  Multiple Vitamin (MULTIVITAMIN) tablet Take 1 tablet by mouth daily.    Historical Provider, MD  nitroGLYCERIN (NITROSTAT) 0.4 MG SL tablet Place 1 tablet (0.4 mg total) under the tongue every 5 (five)  minutes x 3 doses as needed for chest pain. 04/25/15   Alferd Apa Lowne, DO  ondansetron (ZOFRAN ODT) 4 MG disintegrating tablet Take 1 tablet (4 mg total) by mouth every 8 (eight) hours as needed for nausea or vomiting. 12/06/14   Pamella Pert, MD  oxyCODONE (ROXICODONE) 5 MG immediate release tablet Take 1 tablet (5 mg total) by mouth every 4 (four) hours as needed for severe pain. 06/11/15   [name], MD  oxyCODONE-acetaminophen (PERCOCET) 10-325 MG tablet Take 1 tablet by mouth every 4 (four) hours as needed for pain. 07/09/15   [name], MD   Physical Exam: Filed Vitals:   07/23/15 1335 07/23/15 1400 07/23/15 1624  BP: 135/67 110/69 113/59  Pulse: 64 61 80  Temp: 97.6 F (36.4 C)    TempSrc: Oral    Resp: 16 19 14   Height: 5\' 2"  (1.575 m)    Weight: 55.339 kg (122 lb)    SpO2: 99% 100% 100%    Physical Exam  Constitutional: Appears well-developed and well-nourished. No distress.  HENT: Normocephalic. No tonsillar erythema or exudates Eyes: Conjunctivae  are normal. No scleral icterus.  Neck: Normal ROM. Neck supple. No JVD. No tracheal deviation. No thyromegaly.  CVS: RRR, S1/S2 +, no murmurs, no gallops, no carotid bruit.  Pulmonary: Effort and breath sounds normal, no stridor, rhonchi, wheezes, rales.  Abdominal: Soft. BS +,  no distension, tenderness, rebound or guarding.  Musculoskeletal: Normal range of motion. No edema and no tenderness.  Lymphadenopathy: No lymphadenopathy noted, cervical, inguinal. Neuro: Alert. Normal reflexes, muscle tone coordination. No focal neurologic deficits. Skin: Skin is warm and dry. No rash noted.  No erythema. No pallor.  Psychiatric: Normal mood and  affect. Behavior, judgment, thought content normal.   Labs on Admission:  Basic Metabolic Panel:  Recent Labs Lab 07/23/15 1407  NA 137  K 4.1  CL 104  CO2 27  GLUCOSE 93  BUN 12  CREATININE 0.63  CALCIUM 8.7*   Liver Function Tests:  Recent Labs Lab 07/23/15 1407  AST 22  ALT 10*  ALKPHOS 43  BILITOT 0.6  PROT 6.2*  ALBUMIN 3.7   No results for input(s): LIPASE, AMYLASE in the last 168 hours. No results for input(s): AMMONIA in the last 168 hours. CBC:  Recent Labs Lab 07/23/15 1407  WBC 5.2  HGB 11.8*  HCT 35.5*  MCV 92.0  PLT 200   Cardiac Enzymes: No results for input(s): CKTOTAL, CKMB, CKMBINDEX, TROPONINI in the last 168 hours. BNP: Invalid input(s): POCBNP CBG: No results for input(s): GLUCAP in the last 168 hours.  If 7PM-7AM, please contact night-coverage [email or web address] Password [email or web address] 07/23/2015, 4:44 PM

## 2015-07-23 NOTE — ED Notes (Signed)
Bed: GF84 Expected date:  Expected time:  Means of arrival:  Comments: Ems- 81 yo, chest pain

## 2015-07-23 NOTE — Progress Notes (Signed)
PHARMACIST - PHYSICIAN ORDER COMMUNICATION  CONCERNING: P&T Medication Policy on Herbal Medications  DESCRIPTION:  This patient's order for:  Biotin and Zinc (as part of calcium-magnesium-zinc tablet) has been noted.  Magnesium and Calcium have been resumed.  This product(s) is classified as an "herbal" or natural product. Due to a lack of definitive safety studies or FDA approval, nonstandard manufacturing practices, plus the potential risk of unknown drug-drug interactions while on inpatient medications, the Pharmacy and Therapeutics Committee does not permit the use of "herbal" or natural products of this type within [facility].   ACTION TAKEN: The pharmacy department is unable to verify this order at this time and your patient has been informed of this safety policy. Please reevaluate patient's clinical condition at discharge and address if the herbal or natural product(s) should be resumed at that time.  Hershal Coria, PharmD, BCPS Pager: [phone] 07/23/2015 6:30 PM

## 2015-07-24 ENCOUNTER — Observation Stay (HOSPITAL_COMMUNITY): Payer: Medicare Other

## 2015-07-24 ENCOUNTER — Other Ambulatory Visit: Payer: Self-pay | Admitting: Oncology

## 2015-07-24 DIAGNOSIS — C50911 Malignant neoplasm of unspecified site of right female breast: Secondary | ICD-10-CM | POA: Diagnosis not present

## 2015-07-24 DIAGNOSIS — R079 Chest pain, unspecified: Secondary | ICD-10-CM | POA: Diagnosis not present

## 2015-07-24 DIAGNOSIS — F418 Other specified anxiety disorders: Secondary | ICD-10-CM | POA: Diagnosis not present

## 2015-07-24 DIAGNOSIS — C7989 Secondary malignant neoplasm of other specified sites: Secondary | ICD-10-CM

## 2015-07-24 DIAGNOSIS — R0789 Other chest pain: Secondary | ICD-10-CM | POA: Diagnosis not present

## 2015-07-24 DIAGNOSIS — Z170 Estrogen receptor positive status [ER+]: Secondary | ICD-10-CM | POA: Diagnosis not present

## 2015-07-24 DIAGNOSIS — C50019 Malignant neoplasm of nipple and areola, unspecified female breast: Secondary | ICD-10-CM

## 2015-07-24 DIAGNOSIS — D638 Anemia in other chronic diseases classified elsewhere: Secondary | ICD-10-CM | POA: Diagnosis not present

## 2015-07-24 DIAGNOSIS — M810 Age-related osteoporosis without current pathological fracture: Secondary | ICD-10-CM

## 2015-07-24 DIAGNOSIS — G8929 Other chronic pain: Secondary | ICD-10-CM | POA: Diagnosis not present

## 2015-07-24 LAB — TROPONIN I

## 2015-07-24 MED ORDER — TECHNETIUM TC 99M MEDRONATE IV KIT
25.0000 | PACK | Freq: Once | INTRAVENOUS | Status: AC | PRN
  Administered 2015-07-24: 27.1 via INTRAVENOUS

## 2015-07-24 MED ORDER — HYDROMORPHONE HCL 1 MG/ML IJ SOLN
0.5000 mg | Freq: Once | INTRAMUSCULAR | Status: AC
  Administered 2015-07-24: 0.5 mg via INTRAVENOUS
  Filled 2015-07-24: qty 1

## 2015-07-24 MED ORDER — ENOXAPARIN SODIUM 40 MG/0.4ML ~~LOC~~ SOLN: 40.0000 mg | SUBCUTANEOUS | Status: DC

## 2015-07-24 MED ORDER — HYDROCODONE-ACETAMINOPHEN 10-325 MG PO TABS
2.0000 | ORAL_TABLET | Freq: Once | ORAL | Status: AC
  Administered 2015-07-24: 2 via ORAL
  Filled 2015-07-24: qty 2

## 2015-07-24 NOTE — Discharge Summary (Signed)
Physician Discharge Summary  Angel [name] ZJI:[id number] DOB: November 18, 1931 DOA: 07/23/2015  PCP: [name], DO  Admit date: 07/23/2015 Discharge date: 07/24/2015  Recommendations for Outpatient Follow-up:  1. No change in medications on discharge   Discharge Diagnoses:  Principal Problem:   Chest pain Active Problems:   Asthma   Chronic pain   Essential hypertension   CAD (coronary artery disease)   Breast cancer, right breast (HCC)   Dyslipidemia   Anxiety and depression   Anemia of chronic disease   Pain in the chest    Discharge Condition: stable   Diet recommendation: as tolerated   History of present illness:  81 year old very pleasant female with past medical history of asthma, anxiety, hypertension, breast cancer (on Letrozole), dyslipidemia who presented to [facility] ED with reports of chest pain in mid sternal area, presented as heaviness especially with breathing although pain was present at rest as well. Pain was sharp, intermittent, non radiating, 10 out of 10 in intensity at worst and has gotten better with nitroglycerin.  In ED, pt was hemodynamically stable. Blood work was essentially unremarkable except for hemoglobin of 11.8. The first troponin level was WNL.The 12 lead EKG showed sinus rhythm, Pt admitted for chest pain rule out.    Hospital Course:   Assessment & Plan    Principal Problem:  Chest pain / CAD (coronary artery disease) - Heart score of 5 - CT angio chest showed no pulmonary embolism - CXR showed no acute cardiopulmonary findings  - Cardiac enzymes WNL - 2 D ECHO in 11/2013 showed EF of 55%. No need to repeat 2 D ECHO at this time  - Continue daily aspirin - Continue statin therapy   Active Problems:  Asthma - Stable - Continue inhaler per home regimen    Chronic pain - Resume as needed norco for pain control    Essential hypertension - Continue Imdur 30 mg daily   Breast cancer, right breast (HCC) - Continue  letrozole on discharge    Dyslipidemia - Continue zetia and fish oil    Anxiety and depression - Continue valium, Cymbalta and Celexa   Anemia of chronic disease - Secondary to history of malignancy - Hemoglobin stable   DVT prophylaxis:  - Lovenox subQ in hospital   Radiological Exams on Admission: Dg Chest 2 View 07/23/2015 No edema or consolidation. Stable elevation right hemidiaphragm. No change in cardiac silhouette. Electronically Signed By: Lowella Grip III M.D. On: 07/23/2015 14:36   Ct Angio Chest Pe W/cm &/or Wo Cm 07/23/2015 No evidence of pulmonary embolus. Atherosclerosis of thoracic aorta without aneurysm formation. Solitary gallstone seen in visualized portion of upper abdomen. Electronically Signed By: Marijo Conception, M.D. On: 07/23/2015 16:03   Code Status: Full Family Communication: Plan of care discussed with the patient     Signed:  Leisa Lenz, MD  [facility] 07/24/2015, 10:58 AM  Pager #: [phone]  Time spent in minutes: less than 30 minutes   Discharge Exam: Filed Vitals:   07/24/15 0409  BP: 109/60  Pulse: 77  Temp: 98.1 F (36.7 C)  Resp: 19   Filed Vitals:   07/23/15 1624 07/23/15 1704 07/23/15 2210 07/24/15 0409  BP: 113/59 133/64 100/66 109/60  Pulse: 80 84 64 77  Temp:  97.3 F (36.3 C) 97.7 F (36.5 C) 98.1 F (36.7 C)  TempSrc:  Oral Oral Oral  Resp: 14 16 16 19   Height:  5\' 2"  (1.575 m)    Weight:  57.335  kg (126 lb 6.4 oz)    SpO2: 100% 98% 96% 97%    General: Pt is alert, follows commands appropriately, not in acute distress Cardiovascular: Regular rate and rhythm, S1/S2 + Respiratory: Clear to auscultation bilaterally, no wheezing, no crackles, no rhonchi Abdominal: Soft, non tender, non distended, bowel sounds +, no guarding Extremities: no edema, no cyanosis, pulses palpable bilaterally DP and PT Neuro: Grossly nonfocal  Discharge Instructions  Discharge Instructions    Call MD  for:  difficulty breathing, headache or visual disturbances    Complete by:  As directed      Call MD for:  persistant dizziness or light-headedness    Complete by:  As directed      Call MD for:  persistant nausea and vomiting    Complete by:  As directed      Call MD for:  severe uncontrolled pain    Complete by:  As directed      Diet - low sodium heart healthy    Complete by:  As directed      Increase activity slowly    Complete by:  As directed             Medication List    TAKE these medications        acetaminophen 650 MG CR tablet  Commonly known as:  TYLENOL  Take 650 mg by mouth every 8 (eight) hours as needed for pain.     albuterol 108 (90 BASE) MCG/ACT inhaler  Commonly known as:  PROVENTIL HFA;VENTOLIN HFA  Inhale 2 puffs into the lungs every 6 (six) hours as needed for wheezing or shortness of breath.     aspirin EC 81 MG tablet  Take 2 tablets (162 mg total) by mouth daily.     B-COMPLEX PO  Take 1 tablet by mouth daily.     Biotin 5000 MCG Caps  Take 5,000 mcg by mouth daily.     budesonide-formoterol 160-4.5 MCG/ACT inhaler  Commonly known as:  SYMBICORT  Inhale 2 puffs into the lungs 2 (two) times daily as needed.     Calcium-Magnesium-Zinc 1000-400-15 MG Tabs  Take 1 tablet by mouth daily.     celecoxib 200 MG capsule  Commonly known as:  CELEBREX  Take 1 capsule daily with food for pain & inflammation     citalopram 40 MG tablet  Commonly known as:  CELEXA  Take 1 tablet (40 mg total) by mouth daily.     diazepam 5 MG tablet  Commonly known as:  VALIUM  TAKE ONE-HALF TO ONE TABLET BY MOUTH EVERY 8 HOURS AS NEEDED FOR ANXIETY     DRY EYES OP  Apply 2 drops to eye daily as needed (dry eyes).     DULoxetine 60 MG capsule  Commonly known as:  CYMBALTA  Take 1 capsule by mouth  every day for mood     ezetimibe 10 MG tablet  Commonly known as:  ZETIA  Take 1 tablet (10 mg total) by mouth daily.     Fish Oil Oil  Take 1 capsule by  mouth daily.     GROUND FLAX SEEDS PO  Take 5 mLs by mouth daily.     HYDROcodone-acetaminophen 10-325 MG tablet  Commonly known as:  NORCO  Take 1 tablet by mouth every 4 (four) hours as needed for moderate pain.     isosorbide mononitrate 30 MG 24 hr tablet  Commonly known as:  IMDUR  Take 1 tablet (30 mg  total) by mouth daily.     letrozole 2.5 MG tablet  Commonly known as:  FEMARA  Take 1 tablet (2.5 mg total) by mouth daily.     montelukast 10 MG tablet  Commonly known as:  SINGULAIR  Take 1 tablet (10 mg total) by mouth at bedtime.     multivitamin tablet  Take 1 tablet by mouth daily.     nitroGLYCERIN 0.4 MG SL tablet  Commonly known as:  NITROSTAT  Place 1 tablet (0.4 mg total) under the tongue every 5 (five) minutes x 3 doses as needed for chest pain.     ondansetron 4 MG disintegrating tablet  Commonly known as:  ZOFRAN ODT  Take 1 tablet (4 mg total) by mouth every 8 (eight) hours as needed for nausea or vomiting.     oxyCODONE-acetaminophen 10-325 MG tablet  Commonly known as:  PERCOCET  Take 1 tablet by mouth every 4 (four) hours as needed for pain.     Vitamin D-3 5000 UNITS Tabs  Take 1 tablet by mouth daily.           Follow-up Information    Follow up with [name], DO. Schedule an appointment as soon as possible for a visit in 1 week.   Specialty:  Family Medicine   Why:  Follow up appt after recent hospitalization   Contact information:   [place] [id number] [phone]        The results of significant diagnostics from this hospitalization (including imaging, microbiology, ancillary and laboratory) are listed below for reference.    Significant Diagnostic Studies: Dg Chest 2 View  07/23/2015  CLINICAL DATA:  Chest pain and shortness of breath ; weakness for 3 days EXAM: CHEST  2 VIEW COMPARISON:  Chest radiograph and chest CT Feb 14, 2015 FINDINGS: Elevation of the right hemidiaphragm is stable.  There is no appreciable edema or consolidation. Heart size and pulmonary vascularity are normal. No adenopathy. Surgical clips the right axillary region are again noted. There is atherosclerotic calcification in the aorta. Bones appear osteoporotic. IMPRESSION: No edema or consolidation. Stable elevation right hemidiaphragm. No change in cardiac silhouette. Electronically Signed   By: Lowella Grip III M.D.   On: 07/23/2015 14:36   Ct Angio Chest Pe W/cm &/or Wo Cm  07/23/2015  CLINICAL DATA:  Acute chest pain and shortness of breath. EXAM: CT ANGIOGRAPHY CHEST WITH CONTRAST TECHNIQUE: Multidetector CT imaging of the chest was performed using the standard protocol during bolus administration of intravenous contrast. Multiplanar CT image reconstructions and MIPs were obtained to evaluate the vascular anatomy. CONTRAST:  9mL OMNIPAQUE IOHEXOL 350 MG/ML SOLN COMPARISON:  CT scan of chest of Feb 14, 2015. FINDINGS: No pneumothorax or pleural effusion is noted. No acute pulmonary disease is noted. There is no evidence of pulmonary embolus. Atherosclerosis of thoracic aorta is noted without aneurysm formation. Within the visualized portion of the upper abdomen, solitary gallstone is noted. No significant osseous abnormality is noted. No mediastinal mass or adenopathy is noted. Review of the MIP images confirms the above findings. IMPRESSION: No evidence of pulmonary embolus. Atherosclerosis of thoracic aorta without aneurysm formation. Solitary gallstone seen in visualized portion of upper abdomen. Electronically Signed   By: Marijo Conception, M.D.   On: 07/23/2015 16:03    Microbiology: No results found for this or any previous visit (from the past 240 hour(s)).   Labs: Basic Metabolic Panel:  Recent Labs Lab 07/23/15 1407  NA 137  K 4.1  CL 104  CO2 27  GLUCOSE 93  BUN 12  CREATININE 0.63  CALCIUM 8.7*   Liver Function Tests:  Recent Labs Lab 07/23/15 1407  AST 22  ALT 10*  ALKPHOS 43   BILITOT 0.6  PROT 6.2*  ALBUMIN 3.7   No results for input(s): LIPASE, AMYLASE in the last 168 hours. No results for input(s): AMMONIA in the last 168 hours. CBC:  Recent Labs Lab 07/23/15 1407  WBC 5.2  HGB 11.8*  HCT 35.5*  MCV 92.0  PLT 200   Cardiac Enzymes:  Recent Labs Lab 07/23/15 2030 07/24/15 0220 07/24/15 0900  TROPONINI <0.03 <0.03 <0.03   BNP: BNP (last 3 results)  Recent Labs  12/06/14 1244 12/13/14 1525  BNP 95.4 61.6    ProBNP (last 3 results) No results for input(s): PROBNP in the last 8760 hours.  CBG: No results for input(s): GLUCAP in the last 168 hours.

## 2015-07-24 NOTE — Discharge Instructions (Signed)

## 2015-07-24 NOTE — Progress Notes (Signed)
Angel [name]   DOB:12/18/1931   IR#:[id number]   YTK#:[id number]  Subjective: Angel [name] was admitted yesterday with a history classic for unstable angina-- sudden substernal chest pain "more like a pressure", severe, accompanied by SOB, no radiation. She was admitted and so far cardiac w/u is negative. This AM she is comfortable in bed, tells me her breathing is better on O2 (but she is not wearing ehr O2), denies cough, pleurisy, phlegm production. No family in room   Objective: older White woman examined in bed Filed Vitals:   07/24/15 0409  BP: 109/60  Pulse: 77  Temp: 98.1 F (36.7 C)  Resp: 19    Body mass index is 23.11 kg/(m^2).  Intake/Output Summary (Last 24 hours) at 07/24/15 0855 Last data filed at 07/24/15 0834  Gross per 24 hour  Intake 938.33 ml  Output      0 ml  Net 938.33 ml     Sclerae unicteric  No peripheral adenopathy  Lungs no rales or rhonchi  Heart regular rate and rhythm  Abdomen benign  MSK palpation of the sternum and anrior rib cage bilaterally elicited no tenderness  Neuro nonfocal  Breast exam: s/p bilateral mastectomies. No evidence of chest-wall recurrence  CBG (last 3)  No results for input(s): GLUCAP in the last 72 hours.   Labs:  Lab Results  Component Value Date   WBC 5.2 07/23/2015   HGB 11.8* 07/23/2015   HCT 35.5* 07/23/2015   MCV 92.0 07/23/2015   PLT 200 07/23/2015   NEUTROABS 2.9 07/09/2015    '@LASTCHEMISTRY' @  Urine Studies No results for input(s): UHGB, CRYS in the last 72 hours.  Invalid input(s): UACOL, UAPR, USPG, UPH, UTP, UGL, UKET, UBIL, UNIT, UROB, ULEU, UEPI, UWBC, URBC, UBAC, CAST, UCOM, BILUA  Basic Metabolic Panel:  Recent Labs Lab 07/23/15 1407  NA 137  K 4.1  CL 104  CO2 27  GLUCOSE 93  BUN 12  CREATININE 0.63  CALCIUM 8.7*   GFR Estimated Creatinine Clearance: 44.4 mL/min (by C-G formula based on Cr of 0.63). Liver Function Tests:  Recent Labs Lab 07/23/15 1407  AST 22  ALT 10*   ALKPHOS 43  BILITOT 0.6  PROT 6.2*  ALBUMIN 3.7   No results for input(s): LIPASE, AMYLASE in the last 168 hours. No results for input(s): AMMONIA in the last 168 hours. Coagulation profile  Recent Labs Lab 07/23/15 1407  INR 1.07    CBC:  Recent Labs Lab 07/23/15 1407  WBC 5.2  HGB 11.8*  HCT 35.5*  MCV 92.0  PLT 200   Cardiac Enzymes:  Recent Labs Lab 07/23/15 2030 07/24/15 0220  TROPONINI <0.03 <0.03   BNP: Invalid input(s): POCBNP CBG: No results for input(s): GLUCAP in the last 168 hours. D-Dimer No results for input(s): DDIMER in the last 72 hours. Hgb A1c No results for input(s): HGBA1C in the last 72 hours. Lipid Profile No results for input(s): CHOL, HDL, LDLCALC, TRIG, CHOLHDL, LDLDIRECT in the last 72 hours. Thyroid function studies No results for input(s): TSH, T4TOTAL, T3FREE, THYROIDAB in the last 72 hours.  Invalid input(s): FREET3 Anemia work up No results for input(s): VITAMINB12, FOLATE, FERRITIN, TIBC, IRON, RETICCTPCT in the last 72 hours. Microbiology No results found for this or any previous visit (from the past 240 hour(s)).    Studies:  Dg Chest 2 View  07/23/2015  CLINICAL DATA:  Chest pain and shortness of breath ; weakness for 3 days EXAM: CHEST  2 VIEW COMPARISON:  Chest  radiograph and chest CT Feb 14, 2015 FINDINGS: Elevation of the right hemidiaphragm is stable. There is no appreciable edema or consolidation. Heart size and pulmonary vascularity are normal. No adenopathy. Surgical clips the right axillary region are again noted. There is atherosclerotic calcification in the aorta. Bones appear osteoporotic. IMPRESSION: No edema or consolidation. Stable elevation right hemidiaphragm. No change in cardiac silhouette. Electronically Signed   By: Lowella Grip III M.D.   On: 07/23/2015 14:36   Ct Angio Chest Pe W/cm &/or Wo Cm  07/23/2015  CLINICAL DATA:  Acute chest pain and shortness of breath. EXAM: CT ANGIOGRAPHY CHEST  WITH CONTRAST TECHNIQUE: Multidetector CT imaging of the chest was performed using the standard protocol during bolus administration of intravenous contrast. Multiplanar CT image reconstructions and MIPs were obtained to evaluate the vascular anatomy. CONTRAST:  35m OMNIPAQUE IOHEXOL 350 MG/ML SOLN COMPARISON:  CT scan of chest of Feb 14, 2015. FINDINGS: No pneumothorax or pleural effusion is noted. No acute pulmonary disease is noted. There is no evidence of pulmonary embolus. Atherosclerosis of thoracic aorta is noted without aneurysm formation. Within the visualized portion of the upper abdomen, solitary gallstone is noted. No significant osseous abnormality is noted. No mediastinal mass or adenopathy is noted. Review of the MIP images confirms the above findings. IMPRESSION: No evidence of pulmonary embolus. Atherosclerosis of thoracic aorta without aneurysm formation. Solitary gallstone seen in visualized portion of upper abdomen. Electronically Signed   By: JMarijo Conception M.D.   On: 07/23/2015 16:03    Assessment: 81y.o. 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, grade 1, with negative margins, estrogen and progesterone receptor positive (b) on the right, a pT1c pN0, stage IA invasive ductal carcinoma, estrogen receptor and 93% positive, progesterone receptor 87% positive, with an MIB-1 of 35%, and no HER-2 amplification margins were close but negative (c) did not  receive adjuvant antiestrogen therapy  (3) right chest wall skin recurrence resected 01/21/2014, measuring 1.7 cm, with positive margins, estrogen receptor 100% positive, progesterone receptor 75% positive, with an MIB-1 of 80%, and no HER-2 amplification; PET scan 02/15/2014 negative except for Right chest wall focus (a) received 45 Gy electron-beam therapy to the right chest wall completed 03/27/2014 (b) skin progression within and without the radiation field noted 04/30/2014 (c) letrozole started 05/13/2014, continued with fulvestrant  ([facility] a [facility] of -2.9  (5) Fulvestrant started 12/25/2014, most recent dose 07/09/2015     Plan:  I am not sure what the cause of Brittnye's acute chest pain was. It certainly sounds cardiac by history but she is ruling out. I have palpated her chest wall anc cannot reproduce the pain. Also I asked radiology to review the CT/angio for bone lesions. They do not see any evidence of metastases in her sternum or rib cage (she does have thin bones and on the right rib cage some linear sclerostic lesions suggestive of remote traumatic rib fractures)  I am going to request a bone scan to make sure we are not  missing something but at this point Carlin's pain does not seem to be related to her history of breast cancer. She already has an appointment with me for 11/09 and I will follow-up at thattime (I will be out of town the rest of this week).  I appreciate your care of this patient! If oncology can be of further help, please consult one of my partners.   [name], MD 07/24/2015  8:55 AM Medical Oncology and Hematology [facility] [address] [place], [place] [address] Tel. [phone]    Fax. [phone]

## 2015-07-25 ENCOUNTER — Telehealth: Payer: Self-pay

## 2015-07-25 NOTE — Telephone Encounter (Signed)
PCP: [name], DO  Admit date: 07/23/2015 Discharge date: 07/24/2015  Recommendations for Outpatient Follow-up:  1. No change in medications on discharge  Reason for admission: Chest pain  Hospital follow up appt. scheduled: 07/31/15 @ 11:30 am with Dr. Etter Sjogren.    Left a message for call back.

## 2015-07-30 NOTE — Telephone Encounter (Signed)
Spoke with pt she says that the fu appt time is conflicting with another appt that she have. Pt would like to come in later that day or reschedule. Pt would like to make sure that it's okay to reschedule for a week due to providers schedule?   Please advise for rescheduling.

## 2015-07-31 ENCOUNTER — Ambulatory Visit (INDEPENDENT_AMBULATORY_CARE_PROVIDER_SITE_OTHER): Payer: Medicare Other | Admitting: Family Medicine

## 2015-07-31 ENCOUNTER — Ambulatory Visit: Payer: Medicare Other | Admitting: Family Medicine

## 2015-07-31 ENCOUNTER — Encounter: Payer: Self-pay | Admitting: Cardiology

## 2015-07-31 ENCOUNTER — Encounter: Payer: Self-pay | Admitting: Family Medicine

## 2015-07-31 VITALS — BP 102/60 | HR 45 | Temp 97.8°F | Ht 62.0 in | Wt 123.0 lb

## 2015-07-31 DIAGNOSIS — R0609 Other forms of dyspnea: Secondary | ICD-10-CM | POA: Diagnosis not present

## 2015-07-31 DIAGNOSIS — I2510 Atherosclerotic heart disease of native coronary artery without angina pectoris: Secondary | ICD-10-CM | POA: Diagnosis not present

## 2015-07-31 DIAGNOSIS — R296 Repeated falls: Secondary | ICD-10-CM | POA: Diagnosis not present

## 2015-07-31 NOTE — Progress Notes (Signed)
Pre visit review using our clinic review tool, if applicable. No additional management support is needed unless otherwise documented below in the visit note. 

## 2015-07-31 NOTE — Patient Instructions (Signed)

## 2015-07-31 NOTE — Telephone Encounter (Signed)
Pt came in for appt today (07/31/15).

## 2015-07-31 NOTE — Progress Notes (Signed)
Patient ID: Angel [name], female    DOB: December 31, 1931  Age: 81 y.o. MRN: [id number]    Subjective:  Subjective HPI Angel [name] presents for hosp f/u for chest pain and worsening sob.  Pt was admitted and kept overnight.  MI and PE ruled out.    Review of Systems  Constitutional: Negative for diaphoresis, appetite change, fatigue and unexpected weight change.  HENT: Negative for congestion, ear pain and facial swelling.   Eyes: Negative for pain, redness and visual disturbance.  Respiratory: Positive for shortness of breath. Negative for cough, chest tightness and wheezing.   Cardiovascular: Negative for chest pain, palpitations and leg swelling.  Endocrine: Negative for cold intolerance, heat intolerance, polydipsia, polyphagia and polyuria.  Genitourinary: Negative for dysuria, frequency and difficulty urinating.  Neurological: Negative for dizziness, light-headedness, numbness and headaches.  Psychiatric/Behavioral: Negative for decreased concentration. The patient is nervous/anxious.     History Past Medical History  Diagnosis Date  . Asthma   . Spinal stenosis   . Osteoporosis   . Chronic bronchitis   . DDD (degenerative disc disease)   . Osteoarthritis   . Degenerative joint disease   . Coronary artery disease Non-obstructive    a. 2012 Cath: LAD 40-57m->Med Rx;  b. 11/2013 Echo: EF 50-55%, nl wall motion;  c. 12/2013 Lexi MV: EF 74%, no ischemia.  . Depression   . Anxiety   . COPD (chronic obstructive pulmonary disease) ([place])   . Dyslipidemia   . Cholelithiasis   . GERD (gastroesophageal reflux disease)   . Hx of radiation therapy 07/02/1999- 08/11/1999    right supraclavucular/axillary region: 5040 cGy, 28 fractions  . Hx of radiation therapy 02/20/14- 03/27/14    right chest wall 4500 cGy 25 sessions  . Hypertension   . Kidney stones     one stones  . Cataract     eye surgery planned for 11/2014  . PVC's (premature ventricular contractions)   . Reflux  esophagitis   . Pre-diabetes   . Skin cancer of face     non melanoma  . Breast cancer ([place]) 05/07/1999, 06/2011    a. s/p bilat mastectomies. recurrence 01/2014. Radiation, meds.  . Chest wall recurrence of breast cancer ([place]) 2016  . Cancer of chest (wall) ([place])     She has past surgical history that includes Vesicovaginal fistula closure w/ TAH (age 29); Tubal ligation (1961); Appendectomy; Abdominal hysterectomy; Small intestine surgery (1998); Tonsillectomy; Breast lumpectomy (2002); Mastectomy (07/29/11); Cardiac catheterization (03/2011, 3/15); Breast biopsy (Right, 01/21/2014); Vein ligation and stripping (Right); left heart catheterization with coronary angiogram (N/A, 12/05/2013); and left heart catheterization with coronary angiogram (N/A, 11/06/2014).   Her family history includes Asthma in her brother, brother, daughter, and grandchild; Cancer in her mother; Emphysema in her father; Heart disease in her brother and father; Hypertension in her daughter; Lymphoma in her brother; Pancreatic cancer in her mother; Stroke in her daughter.She reports that she has never smoked. She has never used smokeless tobacco. She reports that she drinks alcohol. She reports that she does not use illicit drugs.  Current Outpatient Prescriptions on File Prior to Visit  Medication Sig Dispense Refill  . acetaminophen (TYLENOL) 650 MG CR tablet Take 650 mg by mouth every 8 (eight) hours as needed for pain.    [name] albuterol (PROVENTIL HFA;VENTOLIN HFA) 108 (90 BASE) MCG/ACT inhaler Inhale 2 puffs into the lungs every 6 (six) hours as needed for wheezing or shortness of breath.    . Artificial Tear Ointment (  DRY EYES OP) Apply 2 drops to eye daily as needed (dry eyes).    [name] aspirin EC 81 MG tablet Take 2 tablets (162 mg total) by mouth daily.    . B Complex-Biotin-FA (B-COMPLEX PO) Take 1 tablet by mouth daily.     . Biotin 5000 MCG CAPS Take 5,000 mcg by mouth daily.    . budesonide-formoterol (SYMBICORT) 160-4.5  MCG/ACT inhaler Inhale 2 puffs into the lungs 2 (two) times daily as needed. (Patient taking differently: Inhale 2 puffs into the lungs 2 (two) times daily as needed (shortness of breath). ) 3 Inhaler 1  . Calcium-Magnesium-Zinc 1000-400-15 MG TABS Take 1 tablet by mouth daily.      . celecoxib (CELEBREX) 200 MG capsule Take 1 capsule daily with food for pain & inflammation 90 capsule 3  . Cholecalciferol (VITAMIN D-3) 5000 UNITS TABS Take 1 tablet by mouth daily.     . citalopram (CELEXA) 40 MG tablet Take 1 tablet (40 mg total) by mouth daily.    . diazepam (VALIUM) 5 MG tablet TAKE ONE-HALF TO ONE TABLET BY MOUTH EVERY 8 HOURS AS NEEDED FOR ANXIETY 45 tablet 0  . DULoxetine (CYMBALTA) 60 MG capsule Take 1 capsule by mouth  every day for mood 30 capsule 0  . ezetimibe (ZETIA) 10 MG tablet Take 1 tablet (10 mg total) by mouth daily.    . Fish Oil OIL Take 1 capsule by mouth daily.     . Flaxseed, Linseed, (GROUND FLAX SEEDS PO) Take 5 mLs by mouth daily.     [name] HYDROcodone-acetaminophen (NORCO) 10-325 MG tablet Take 1 tablet by mouth every 4 (four) hours as needed for moderate pain.    . isosorbide mononitrate (IMDUR) 30 MG 24 hr tablet Take 1 tablet (30 mg total) by mouth daily. 90 tablet 3  . letrozole (FEMARA) 2.5 MG tablet Take 1 tablet (2.5 mg total) by mouth daily. 90 tablet 1  . montelukast (SINGULAIR) 10 MG tablet Take 1 tablet (10 mg total) by mouth at bedtime. 90 tablet 2  . Multiple Vitamin (MULTIVITAMIN) tablet Take 1 tablet by mouth daily.    . nitroGLYCERIN (NITROSTAT) 0.4 MG SL tablet Place 1 tablet (0.4 mg total) under the tongue every 5 (five) minutes x 3 doses as needed for chest pain. 25 tablet 3  . ondansetron (ZOFRAN ODT) 4 MG disintegrating tablet Take 1 tablet (4 mg total) by mouth every 8 (eight) hours as needed for nausea or vomiting. 20 tablet 0  . oxyCODONE-acetaminophen (PERCOCET) 10-325 MG tablet Take 1 tablet by mouth every 4 (four) hours as needed for pain. 60 tablet 0    No current facility-administered medications on file prior to visit.     Objective:  Objective Physical Exam  Constitutional: She is oriented to person, place, and time. She appears well-developed and well-nourished.  HENT:  Head: Normocephalic and atraumatic.  Eyes: Conjunctivae and EOM are normal.  Neck: Normal range of motion. Neck supple. No JVD present. Carotid bruit is not present. No thyromegaly present.  Cardiovascular: Normal rate, regular rhythm and normal heart sounds.   No murmur heard. Pulmonary/Chest: Effort normal and breath sounds normal. No respiratory distress. She has no wheezes. She has no rales. She exhibits no tenderness.  Musculoskeletal: She exhibits no edema.  Neurological: She is alert and oriented to person, place, and time.  Psychiatric: She has a normal mood and affect. Her behavior is normal.  Nursing note and vitals reviewed.  BP 102/60 mmHg  Pulse  45  Temp(Src) 97.8 F (36.6 C) (Oral)  Ht 5\' 2"  (1.575 m)  Wt 123 lb (55.792 kg)  BMI 22.49 kg/m2  SpO2 96% Wt Readings from Last 3 Encounters:  07/31/15 123 lb (55.792 kg)  07/23/15 126 lb 6.4 oz (57.335 kg)  05/14/15 127 lb 4.8 oz (57.743 kg)     Lab Results  Component Value Date   WBC 5.2 07/23/2015   HGB 11.8* 07/23/2015   HCT 35.5* 07/23/2015   PLT 200 07/23/2015   GLUCOSE 93 07/23/2015   CHOL 199 04/25/2015   TRIG 96.0 04/25/2015   HDL 58.60 04/25/2015   LDLCALC 121* 04/25/2015   ALT 10* 07/23/2015   AST 22 07/23/2015   NA 137 07/23/2015   K 4.1 07/23/2015   CL 104 07/23/2015   CREATININE 0.63 07/23/2015   BUN 12 07/23/2015   CO2 27 07/23/2015   TSH 3.234 09/06/2014   INR 1.07 07/23/2015   HGBA1C 5.4 09/06/2014   MICROALBUR 0.77 05/01/2014    Dg Chest 2 View  07/23/2015  CLINICAL DATA:  Chest pain and shortness of breath ; weakness for 3 days EXAM: CHEST  2 VIEW COMPARISON:  Chest radiograph and chest CT Feb 14, 2015 FINDINGS: Elevation of the right hemidiaphragm is  stable. There is no appreciable edema or consolidation. Heart size and pulmonary vascularity are normal. No adenopathy. Surgical clips the right axillary region are again noted. There is atherosclerotic calcification in the aorta. Bones appear osteoporotic. IMPRESSION: No edema or consolidation. Stable elevation right hemidiaphragm. No change in cardiac silhouette. Electronically Signed   By: Lowella Grip III M.D.   On: 07/23/2015 14:36   Ct Angio Chest Pe W/cm &/or Wo Cm  07/23/2015  CLINICAL DATA:  Acute chest pain and shortness of breath. EXAM: CT ANGIOGRAPHY CHEST WITH CONTRAST TECHNIQUE: Multidetector CT imaging of the chest was performed using the standard protocol during bolus administration of intravenous contrast. Multiplanar CT image reconstructions and MIPs were obtained to evaluate the vascular anatomy. CONTRAST:  9mL OMNIPAQUE IOHEXOL 350 MG/ML SOLN COMPARISON:  CT scan of chest of Feb 14, 2015. FINDINGS: No pneumothorax or pleural effusion is noted. No acute pulmonary disease is noted. There is no evidence of pulmonary embolus. Atherosclerosis of thoracic aorta is noted without aneurysm formation. Within the visualized portion of the upper abdomen, solitary gallstone is noted. No significant osseous abnormality is noted. No mediastinal mass or adenopathy is noted. Review of the MIP images confirms the above findings. IMPRESSION: No evidence of pulmonary embolus. Atherosclerosis of thoracic aorta without aneurysm formation. Solitary gallstone seen in visualized portion of upper abdomen. Electronically Signed   By: Marijo Conception, M.D.   On: 07/23/2015 16:03   Nm Bone Scan Whole Body  07/24/2015  CLINICAL DATA:  Right breast cancer in 2002 1,012 with right mastectomy and chest wall recurrence in 2015 status post radiation therapy. EXAM: NUCLEAR MEDICINE WHOLE BODY BONE SCAN TECHNIQUE: Whole body anterior and posterior images were obtained approximately 3 hours after intravenous injection  of radiopharmaceutical. RADIOPHARMACEUTICALS:  27.1 mCi Technetium-80m MDP IV COMPARISON:  12/24/2014 PET/CT.  07/23/2015 chest CT angiogram. FINDINGS: There is intense focal tracer uptake in the lateral aspect of the right mid sternal body, correlating with a sclerotic lesion on chest CT study from 1 day prior. There is focal tracer uptake in the anterior right third, fourth, fifth and sixth ribs and anterolateral right seventh, eighth and ninth ribs, correlating with sclerotic foci on the chest CT study from 1 day  prior, with superimposed nondisplaced fracture in the anterior right third rib on the chest CT study from 1 day prior. Relatively symmetric focal uptake in the lower lumbar spine correlates with degenerative changes seen in this location on the 12/24/2014 PET-CT. Focal uptake in the distal right lateral femoral condyle is likely degenerative. Otherwise no abnormal foci of radiotracer uptake in the skeleton. IMPRESSION: 1. Focal radiotracer uptake in the lateral aspect of the right mid sternal body. Focal radiotracer uptake in the anterior right third through sixth ribs and anterolateral right seventh through ninth ribs. These foci of uptake correlate with abnormal sclerotic foci in the right sternum and right third through ninth ribs on the chest CT study from 1 day prior, which also demonstrated a nondisplaced anterior right third rib fracture. The relatively localized/geographic pattern of uptake in the right chest suggests a possible diagnosis of multifocal radiation osteonecrosis with superimposed right third rib pathologic fracture, although bone metastases cannot be excluded. 2. Degenerative uptake in the lower lumbar spine and lateral right knee. Electronically Signed   By: Ilona Sorrel M.D.   On: 07/24/2015 15:19     Assessment & Plan:  Plan I am having Angel [name] maintain her Calcium-Magnesium-Zinc, Vitamin D-3, Fish Oil, Biotin, B Complex-Biotin-FA (B-COMPLEX PO), multivitamin,  ondansetron, (Flaxseed, Linseed, (GROUND FLAX SEEDS PO)), budesonide-formoterol, albuterol, celecoxib, ezetimibe, isosorbide mononitrate, montelukast, nitroGLYCERIN, aspirin EC, letrozole, citalopram, diazepam, DULoxetine, oxyCODONE-acetaminophen, HYDROcodone-acetaminophen, acetaminophen, and Artificial Tear Ointment (DRY EYES OP).  No orders of the defined types were placed in this encounter.    Problem List Items Addressed This Visit    Frequent falls    Home health referral put in      [facility] (dyspnea on exertion) - Primary    Echo ordered Home health rto 3 months      Relevant Orders   ECHOCARDIOGRAM COMPLETE   Ambulatory referral to [facility]    Other Visit Diagnoses    Falls frequently          home health referral  Follow-up: Return in about 3 months (around 10/31/2015), or if symptoms worsen or fail to improve.  [name], DO

## 2015-08-01 ENCOUNTER — Telehealth: Payer: Self-pay

## 2015-08-01 DIAGNOSIS — C50911 Malignant neoplasm of unspecified site of right female breast: Secondary | ICD-10-CM | POA: Diagnosis not present

## 2015-08-01 DIAGNOSIS — R296 Repeated falls: Secondary | ICD-10-CM | POA: Diagnosis not present

## 2015-08-01 DIAGNOSIS — I2510 Atherosclerotic heart disease of native coronary artery without angina pectoris: Secondary | ICD-10-CM | POA: Diagnosis not present

## 2015-08-01 DIAGNOSIS — J45909 Unspecified asthma, uncomplicated: Secondary | ICD-10-CM | POA: Diagnosis not present

## 2015-08-01 DIAGNOSIS — D638 Anemia in other chronic diseases classified elsewhere: Secondary | ICD-10-CM | POA: Diagnosis not present

## 2015-08-01 DIAGNOSIS — F418 Other specified anxiety disorders: Secondary | ICD-10-CM | POA: Diagnosis not present

## 2015-08-01 DIAGNOSIS — J449 Chronic obstructive pulmonary disease, unspecified: Secondary | ICD-10-CM | POA: Diagnosis not present

## 2015-08-01 DIAGNOSIS — I10 Essential (primary) hypertension: Secondary | ICD-10-CM | POA: Diagnosis not present

## 2015-08-01 NOTE — Telephone Encounter (Signed)
-----   Message from [name], DO sent at 07/31/2015 12:14 PM EDT ----- Pt needs Over night oximetry -- for insomnia and sob

## 2015-08-01 NOTE — Telephone Encounter (Signed)
Order complete, waiting for sig and will be faxed to [facility] on Monday.     KP

## 2015-08-02 DIAGNOSIS — R0609 Other forms of dyspnea: Principal | ICD-10-CM

## 2015-08-02 DIAGNOSIS — R296 Repeated falls: Secondary | ICD-10-CM | POA: Insufficient documentation

## 2015-08-02 NOTE — Assessment & Plan Note (Signed)
Echo ordered Home health rto 3 months

## 2015-08-02 NOTE — Assessment & Plan Note (Signed)
con't mononitrate

## 2015-08-02 NOTE — Assessment & Plan Note (Signed)
Home health referral put in.  

## 2015-08-04 ENCOUNTER — Ambulatory Visit (HOSPITAL_COMMUNITY): Payer: No Typology Code available for payment source

## 2015-08-04 DIAGNOSIS — R296 Repeated falls: Secondary | ICD-10-CM | POA: Diagnosis not present

## 2015-08-04 DIAGNOSIS — F418 Other specified anxiety disorders: Secondary | ICD-10-CM | POA: Diagnosis not present

## 2015-08-04 DIAGNOSIS — I10 Essential (primary) hypertension: Secondary | ICD-10-CM | POA: Diagnosis not present

## 2015-08-04 DIAGNOSIS — J45909 Unspecified asthma, uncomplicated: Secondary | ICD-10-CM | POA: Diagnosis not present

## 2015-08-04 DIAGNOSIS — D638 Anemia in other chronic diseases classified elsewhere: Secondary | ICD-10-CM | POA: Diagnosis not present

## 2015-08-04 DIAGNOSIS — C50911 Malignant neoplasm of unspecified site of right female breast: Secondary | ICD-10-CM | POA: Diagnosis not present

## 2015-08-04 DIAGNOSIS — J449 Chronic obstructive pulmonary disease, unspecified: Secondary | ICD-10-CM | POA: Diagnosis not present

## 2015-08-04 DIAGNOSIS — I2510 Atherosclerotic heart disease of native coronary artery without angina pectoris: Secondary | ICD-10-CM | POA: Diagnosis not present

## 2015-08-05 ENCOUNTER — Telehealth: Payer: Self-pay | Admitting: Family Medicine

## 2015-08-05 DIAGNOSIS — R296 Repeated falls: Secondary | ICD-10-CM | POA: Diagnosis not present

## 2015-08-05 DIAGNOSIS — J4520 Mild intermittent asthma, uncomplicated: Secondary | ICD-10-CM

## 2015-08-05 DIAGNOSIS — J45909 Unspecified asthma, uncomplicated: Secondary | ICD-10-CM | POA: Diagnosis not present

## 2015-08-05 DIAGNOSIS — F418 Other specified anxiety disorders: Secondary | ICD-10-CM | POA: Diagnosis not present

## 2015-08-05 DIAGNOSIS — C50911 Malignant neoplasm of unspecified site of right female breast: Secondary | ICD-10-CM | POA: Diagnosis not present

## 2015-08-05 DIAGNOSIS — G894 Chronic pain syndrome: Secondary | ICD-10-CM

## 2015-08-05 DIAGNOSIS — I10 Essential (primary) hypertension: Secondary | ICD-10-CM | POA: Diagnosis not present

## 2015-08-05 DIAGNOSIS — I2510 Atherosclerotic heart disease of native coronary artery without angina pectoris: Secondary | ICD-10-CM

## 2015-08-05 DIAGNOSIS — F329 Major depressive disorder, single episode, unspecified: Secondary | ICD-10-CM

## 2015-08-05 DIAGNOSIS — D638 Anemia in other chronic diseases classified elsewhere: Secondary | ICD-10-CM | POA: Diagnosis not present

## 2015-08-05 DIAGNOSIS — F32A Depression, unspecified: Secondary | ICD-10-CM

## 2015-08-05 DIAGNOSIS — J449 Chronic obstructive pulmonary disease, unspecified: Secondary | ICD-10-CM | POA: Diagnosis not present

## 2015-08-05 MED ORDER — DULOXETINE HCL 60 MG PO CPEP: ORAL_CAPSULE | ORAL | Status: DC

## 2015-08-05 MED ORDER — ALBUTEROL SULFATE HFA 108 (90 BASE) MCG/ACT IN AERS: 2.0000 | INHALATION_SPRAY | Freq: Four times a day (QID) | RESPIRATORY_TRACT | Status: DC | PRN

## 2015-08-05 MED ORDER — MONTELUKAST SODIUM 10 MG PO TABS: 10.0000 mg | ORAL_TABLET | Freq: Every day | ORAL | Status: DC

## 2015-08-05 MED ORDER — ISOSORBIDE MONONITRATE ER 30 MG PO TB24: 30.0000 mg | ORAL_TABLET | Freq: Every day | ORAL | Status: DC

## 2015-08-05 MED ORDER — CELECOXIB 200 MG PO CAPS: ORAL_CAPSULE | ORAL | Status: DC

## 2015-08-05 NOTE — Telephone Encounter (Signed)
Caller name: Jamey Ripa.  Relation to pt: Physical Therapist  Call back number: [phone]  Pharmacy:  Reason for call:  PT requesting verbal orders for OT ADL teaching.

## 2015-08-05 NOTE — Telephone Encounter (Signed)
Relation to ZS:MOLM Call back number:[phone] Pharmacy: [facility], [place] [phone] (Phone) [phone] (Fax)         Reason for call: requesting a 90 day supply   celecoxib (CELEBREX) 200 MG capsule  DULoxetine (CYMBALTA) 60 MG capsule  isosorbide mononitrate (IMDUR) 30 MG 24 hr tablet  monitroGLYCERIN (NITROSTAT) 0.4 MG SL tablet  ntelukast (SINGULAIR) 10 MG tablet  nitroGLYCERIN (NITROSTAT) 0.4 MG SL tablet  albuterol (PROVENTIL HFA;VENTOLIN HFA) 108 (90 BASE) MCG/ACT inhaler [[id number]]  Biotin 5000 MCG CAPS  letrozole (FEMARA) 2.5 MG tablet

## 2015-08-05 NOTE — Telephone Encounter (Signed)
Med's filled except Nitroglycerin and Femara, she has to call the providers who write these scripts.     KP

## 2015-08-05 NOTE — Telephone Encounter (Signed)
Verbal left on the VM and I advised to call if any concerns.      KP

## 2015-08-06 ENCOUNTER — Telehealth: Payer: Self-pay | Admitting: Oncology

## 2015-08-06 ENCOUNTER — Ambulatory Visit (HOSPITAL_BASED_OUTPATIENT_CLINIC_OR_DEPARTMENT_OTHER): Payer: Medicare Other | Admitting: Oncology

## 2015-08-06 ENCOUNTER — Other Ambulatory Visit (HOSPITAL_BASED_OUTPATIENT_CLINIC_OR_DEPARTMENT_OTHER): Payer: Medicare Other

## 2015-08-06 ENCOUNTER — Ambulatory Visit (HOSPITAL_BASED_OUTPATIENT_CLINIC_OR_DEPARTMENT_OTHER): Payer: Medicare Other

## 2015-08-06 VITALS — BP 100/79 | HR 72 | Temp 97.6°F | Resp 18 | Ht 62.0 in | Wt 126.4 lb

## 2015-08-06 DIAGNOSIS — C50911 Malignant neoplasm of unspecified site of right female breast: Secondary | ICD-10-CM | POA: Diagnosis not present

## 2015-08-06 DIAGNOSIS — C50511 Malignant neoplasm of lower-outer quadrant of right female breast: Secondary | ICD-10-CM

## 2015-08-06 DIAGNOSIS — Z5111 Encounter for antineoplastic chemotherapy: Secondary | ICD-10-CM | POA: Diagnosis not present

## 2015-08-06 DIAGNOSIS — C792 Secondary malignant neoplasm of skin: Secondary | ICD-10-CM

## 2015-08-06 DIAGNOSIS — M8580 Other specified disorders of bone density and structure, unspecified site: Secondary | ICD-10-CM

## 2015-08-06 DIAGNOSIS — C50919 Malignant neoplasm of unspecified site of unspecified female breast: Secondary | ICD-10-CM

## 2015-08-06 DIAGNOSIS — Z170 Estrogen receptor positive status [ER+]: Secondary | ICD-10-CM

## 2015-08-06 DIAGNOSIS — C50411 Malignant neoplasm of upper-outer quadrant of right female breast: Secondary | ICD-10-CM

## 2015-08-06 DIAGNOSIS — G8929 Other chronic pain: Secondary | ICD-10-CM

## 2015-08-06 DIAGNOSIS — M810 Age-related osteoporosis without current pathological fracture: Secondary | ICD-10-CM

## 2015-08-06 LAB — COMPREHENSIVE METABOLIC PANEL (CC13)
ALBUMIN: 3.7 g/dL (ref 3.5–5.0)
ALK PHOS: 45 U/L (ref 40–150)
ALT: 10 U/L (ref 0–55)
AST: 17 U/L (ref 5–34)
Anion Gap: 8 mEq/L (ref 3–11)
BUN: 9.3 mg/dL (ref 7.0–26.0)
CO2: 28 mEq/L (ref 22–29)
Calcium: 9.5 mg/dL (ref 8.4–10.4)
Chloride: 101 mEq/L (ref 98–109)
Creatinine: 0.7 mg/dL (ref 0.6–1.1)
EGFR: 82 mL/min/{1.73_m2} — ABNORMAL LOW (ref >90)
GLUCOSE: 61 mg/dL — AB (ref 70–140)
POTASSIUM: 4.1 meq/L (ref 3.5–5.1)
SODIUM: 138 meq/L (ref 136–145)
Total Bilirubin: 0.4 mg/dL (ref 0.20–1.20)
Total Protein: 6.3 g/dL — ABNORMAL LOW (ref 6.4–8.3)

## 2015-08-06 LAB — CANCER ANTIGEN 27.29: CA 27.29: 11 U/mL (ref 0–39)

## 2015-08-06 MED ORDER — FULVESTRANT 250 MG/5ML IM SOLN
500.0000 mg | Freq: Once | INTRAMUSCULAR | Status: AC
  Administered 2015-08-06: 500 mg via INTRAMUSCULAR
  Filled 2015-08-06: qty 10

## 2015-08-06 MED ORDER — DENOSUMAB 60 MG/ML ~~LOC~~ SOLN
60.0000 mg | Freq: Once | SUBCUTANEOUS | Status: AC
  Administered 2015-08-06: 60 mg via SUBCUTANEOUS
  Filled 2015-08-06: qty 1

## 2015-08-06 MED ORDER — OXYCODONE HCL 5 MG PO TABS: 5.0000 mg | ORAL_TABLET | Freq: Four times a day (QID) | ORAL | Status: DC | PRN

## 2015-08-06 NOTE — Telephone Encounter (Signed)
lvm advising patient of message below °

## 2015-08-06 NOTE — Telephone Encounter (Signed)
Appointments made and avs printed for patient °
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
## 2015-08-07 ENCOUNTER — Telehealth: Payer: Self-pay | Admitting: Family Medicine

## 2015-08-07 DIAGNOSIS — J449 Chronic obstructive pulmonary disease, unspecified: Secondary | ICD-10-CM | POA: Diagnosis not present

## 2015-08-07 DIAGNOSIS — J45909 Unspecified asthma, uncomplicated: Secondary | ICD-10-CM | POA: Diagnosis not present

## 2015-08-07 DIAGNOSIS — F418 Other specified anxiety disorders: Secondary | ICD-10-CM | POA: Diagnosis not present

## 2015-08-07 DIAGNOSIS — I2510 Atherosclerotic heart disease of native coronary artery without angina pectoris: Secondary | ICD-10-CM | POA: Diagnosis not present

## 2015-08-07 DIAGNOSIS — I10 Essential (primary) hypertension: Secondary | ICD-10-CM | POA: Diagnosis not present

## 2015-08-07 DIAGNOSIS — C50911 Malignant neoplasm of unspecified site of right female breast: Secondary | ICD-10-CM | POA: Diagnosis not present

## 2015-08-07 DIAGNOSIS — D638 Anemia in other chronic diseases classified elsewhere: Secondary | ICD-10-CM | POA: Diagnosis not present

## 2015-08-07 DIAGNOSIS — R296 Repeated falls: Secondary | ICD-10-CM | POA: Diagnosis not present

## 2015-08-07 NOTE — Telephone Encounter (Signed)
Caller name: Self  Can be reached: [phone]   Reason for call: Patient wants to know why she was scheduled for an Echocardiogram. 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

## 2015-08-07 NOTE — Telephone Encounter (Signed)
As discussed at ov-- she was c/o sob and falling--  I felt it needed to be checked again because we don't have one in system --- at least not recent--- if she wants to see her cardiologist for her sob she can

## 2015-08-07 NOTE — Telephone Encounter (Signed)
Called patient back to inform her of below. Patient states she will keep appointment for Echo./DLS

## 2015-08-07 NOTE — Telephone Encounter (Signed)
Please advise      KP 

## 2015-08-08 ENCOUNTER — Telehealth: Payer: Self-pay | Admitting: Oncology

## 2015-08-08 DIAGNOSIS — R296 Repeated falls: Secondary | ICD-10-CM | POA: Diagnosis not present

## 2015-08-08 DIAGNOSIS — J449 Chronic obstructive pulmonary disease, unspecified: Secondary | ICD-10-CM | POA: Diagnosis not present

## 2015-08-08 DIAGNOSIS — J45909 Unspecified asthma, uncomplicated: Secondary | ICD-10-CM | POA: Diagnosis not present

## 2015-08-08 DIAGNOSIS — I2510 Atherosclerotic heart disease of native coronary artery without angina pectoris: Secondary | ICD-10-CM | POA: Diagnosis not present

## 2015-08-08 DIAGNOSIS — F418 Other specified anxiety disorders: Secondary | ICD-10-CM | POA: Diagnosis not present

## 2015-08-08 DIAGNOSIS — I10 Essential (primary) hypertension: Secondary | ICD-10-CM | POA: Diagnosis not present

## 2015-08-08 DIAGNOSIS — D638 Anemia in other chronic diseases classified elsewhere: Secondary | ICD-10-CM | POA: Diagnosis not present

## 2015-08-08 DIAGNOSIS — C50911 Malignant neoplasm of unspecified site of right female breast: Secondary | ICD-10-CM | POA: Diagnosis not present

## 2015-08-08 NOTE — Telephone Encounter (Signed)
i returned her call as she was stating that the patient needed to cancel her 11/14 inj as she recd both injections on 11/9,i had kay l look and that was correct,appt cancelled

## 2015-08-10 DIAGNOSIS — R0602 Shortness of breath: Secondary | ICD-10-CM | POA: Diagnosis not present

## 2015-08-11 ENCOUNTER — Ambulatory Visit: Payer: Medicare Other

## 2015-08-13 ENCOUNTER — Other Ambulatory Visit: Payer: Self-pay | Admitting: Family Medicine

## 2015-08-13 ENCOUNTER — Other Ambulatory Visit: Payer: Self-pay | Admitting: Nurse Practitioner

## 2015-08-14 ENCOUNTER — Telehealth: Payer: Self-pay | Admitting: *Deleted

## 2015-08-14 DIAGNOSIS — J449 Chronic obstructive pulmonary disease, unspecified: Secondary | ICD-10-CM | POA: Diagnosis not present

## 2015-08-14 DIAGNOSIS — R296 Repeated falls: Secondary | ICD-10-CM | POA: Diagnosis not present

## 2015-08-14 DIAGNOSIS — I2510 Atherosclerotic heart disease of native coronary artery without angina pectoris: Secondary | ICD-10-CM | POA: Diagnosis not present

## 2015-08-14 DIAGNOSIS — C50911 Malignant neoplasm of unspecified site of right female breast: Secondary | ICD-10-CM | POA: Diagnosis not present

## 2015-08-14 NOTE — Telephone Encounter (Signed)
Forwarded to Dr. Lowne for review/signature. JG//CMA 

## 2015-08-14 NOTE — Telephone Encounter (Signed)
Last seen 07/31/15 and 07/07/15 #45  Please advise     KP

## 2015-08-15 DIAGNOSIS — R296 Repeated falls: Secondary | ICD-10-CM | POA: Diagnosis not present

## 2015-08-15 DIAGNOSIS — J45909 Unspecified asthma, uncomplicated: Secondary | ICD-10-CM | POA: Diagnosis not present

## 2015-08-15 DIAGNOSIS — J449 Chronic obstructive pulmonary disease, unspecified: Secondary | ICD-10-CM | POA: Diagnosis not present

## 2015-08-15 DIAGNOSIS — F418 Other specified anxiety disorders: Secondary | ICD-10-CM | POA: Diagnosis not present

## 2015-08-15 DIAGNOSIS — I2510 Atherosclerotic heart disease of native coronary artery without angina pectoris: Secondary | ICD-10-CM | POA: Diagnosis not present

## 2015-08-15 DIAGNOSIS — D638 Anemia in other chronic diseases classified elsewhere: Secondary | ICD-10-CM | POA: Diagnosis not present

## 2015-08-15 DIAGNOSIS — I10 Essential (primary) hypertension: Secondary | ICD-10-CM | POA: Diagnosis not present

## 2015-08-15 DIAGNOSIS — C50911 Malignant neoplasm of unspecified site of right female breast: Secondary | ICD-10-CM | POA: Diagnosis not present

## 2015-08-15 NOTE — Telephone Encounter (Signed)
Faxed to [facility] successfully. Sent for scanning. JG//CMA  

## 2015-08-20 ENCOUNTER — Ambulatory Visit (HOSPITAL_BASED_OUTPATIENT_CLINIC_OR_DEPARTMENT_OTHER)
Admission: RE | Admit: 2015-08-20 | Discharge: 2015-08-20 | Disposition: A | Discharge status: Home / Self Care | Payer: Medicare Other | Source: Ambulatory Visit | Attending: Family Medicine | Admitting: Family Medicine

## 2015-08-20 ENCOUNTER — Telehealth: Payer: Self-pay

## 2015-08-20 DIAGNOSIS — I059 Rheumatic mitral valve disease, unspecified: Secondary | ICD-10-CM | POA: Diagnosis not present

## 2015-08-20 DIAGNOSIS — I10 Essential (primary) hypertension: Secondary | ICD-10-CM | POA: Insufficient documentation

## 2015-08-20 DIAGNOSIS — R0600 Dyspnea, unspecified: Secondary | ICD-10-CM | POA: Diagnosis not present

## 2015-08-20 DIAGNOSIS — I340 Nonrheumatic mitral (valve) insufficiency: Secondary | ICD-10-CM | POA: Insufficient documentation

## 2015-08-20 DIAGNOSIS — E785 Hyperlipidemia, unspecified: Secondary | ICD-10-CM | POA: Insufficient documentation

## 2015-08-20 DIAGNOSIS — I517 Cardiomegaly: Secondary | ICD-10-CM | POA: Insufficient documentation

## 2015-08-20 DIAGNOSIS — R0609 Other forms of dyspnea: Secondary | ICD-10-CM

## 2015-08-20 NOTE — Telephone Encounter (Signed)
Overnight Oximetry results received and the patient qualifies for Nocturnal O2. The order has been faxed and message left for the patient to call the office.     KP

## 2015-08-20 NOTE — Progress Notes (Signed)
  Echocardiogram 2D Echocardiogram has been performed.  Angel [name] 08/20/2015, 11:02 AM

## 2015-08-22 DIAGNOSIS — J45909 Unspecified asthma, uncomplicated: Secondary | ICD-10-CM | POA: Diagnosis not present

## 2015-08-25 DIAGNOSIS — R296 Repeated falls: Secondary | ICD-10-CM | POA: Diagnosis not present

## 2015-08-25 DIAGNOSIS — J449 Chronic obstructive pulmonary disease, unspecified: Secondary | ICD-10-CM | POA: Diagnosis not present

## 2015-08-25 DIAGNOSIS — D638 Anemia in other chronic diseases classified elsewhere: Secondary | ICD-10-CM | POA: Diagnosis not present

## 2015-08-25 DIAGNOSIS — I2510 Atherosclerotic heart disease of native coronary artery without angina pectoris: Secondary | ICD-10-CM | POA: Diagnosis not present

## 2015-08-25 DIAGNOSIS — J45909 Unspecified asthma, uncomplicated: Secondary | ICD-10-CM | POA: Diagnosis not present

## 2015-08-25 DIAGNOSIS — C50911 Malignant neoplasm of unspecified site of right female breast: Secondary | ICD-10-CM | POA: Diagnosis not present

## 2015-08-25 DIAGNOSIS — F418 Other specified anxiety disorders: Secondary | ICD-10-CM | POA: Diagnosis not present

## 2015-08-25 DIAGNOSIS — I10 Essential (primary) hypertension: Secondary | ICD-10-CM | POA: Diagnosis not present

## 2015-08-25 NOTE — Telephone Encounter (Signed)
Echo is reviewed.  I see that her heart function is stable compared to echo from 2015.  I would recommend that she arrange her 6 month follow up with Dr. Radford Pax. She wanted to see her back in January.

## 2015-08-25 NOTE — Telephone Encounter (Signed)
Called patient who states her O2 has been set up and she has been using it. States she feels much better. Patient would like to know results of Echocardiogram. Advised I would let covering Dr. Review since Dr. Etter Sjogren ordered and call her back with results.

## 2015-08-25 NOTE — Telephone Encounter (Signed)
Relation to [id number] Call back number:[phone]   Reason for call:  Patient returning call

## 2015-08-26 NOTE — Telephone Encounter (Signed)
Notified pt and she voices understanding. 

## 2015-09-02 ENCOUNTER — Other Ambulatory Visit (HOSPITAL_BASED_OUTPATIENT_CLINIC_OR_DEPARTMENT_OTHER): Payer: Medicare Other

## 2015-09-02 ENCOUNTER — Other Ambulatory Visit: Payer: Self-pay

## 2015-09-02 ENCOUNTER — Ambulatory Visit (HOSPITAL_BASED_OUTPATIENT_CLINIC_OR_DEPARTMENT_OTHER): Payer: Medicare Other

## 2015-09-02 VITALS — BP 119/78 | HR 76 | Temp 98.1°F

## 2015-09-02 DIAGNOSIS — C50911 Malignant neoplasm of unspecified site of right female breast: Secondary | ICD-10-CM

## 2015-09-02 DIAGNOSIS — M810 Age-related osteoporosis without current pathological fracture: Secondary | ICD-10-CM

## 2015-09-02 DIAGNOSIS — C50511 Malignant neoplasm of lower-outer quadrant of right female breast: Secondary | ICD-10-CM

## 2015-09-02 DIAGNOSIS — C7989 Secondary malignant neoplasm of other specified sites: Secondary | ICD-10-CM

## 2015-09-02 DIAGNOSIS — Z5111 Encounter for antineoplastic chemotherapy: Secondary | ICD-10-CM

## 2015-09-02 DIAGNOSIS — G8929 Other chronic pain: Secondary | ICD-10-CM

## 2015-09-02 LAB — CBC WITH DIFFERENTIAL/PLATELET
BASO%: 0.7 % (ref 0.0–2.0)
BASOS ABS: 0 10*3/uL (ref 0.0–0.1)
EOS ABS: 0.1 10*3/uL (ref 0.0–0.5)
EOS%: 1.4 % (ref 0.0–7.0)
HEMATOCRIT: 38.4 % (ref 34.8–46.6)
HEMOGLOBIN: 12.7 g/dL (ref 11.6–15.9)
LYMPH#: 1.8 10*3/uL (ref 0.9–3.3)
LYMPH%: 27.2 % (ref 14.0–49.7)
MCH: 30.5 pg (ref 25.1–34.0)
MCHC: 33.1 g/dL (ref 31.5–36.0)
MCV: 92 fL (ref 79.5–101.0)
MONO#: 0.5 10*3/uL (ref 0.1–0.9)
MONO%: 7.4 % (ref 0.0–14.0)
NEUT%: 63.3 % (ref 38.4–76.8)
NEUTROS ABS: 4.1 10*3/uL (ref 1.5–6.5)
Platelets: 232 10*3/uL (ref 145–400)
RBC: 4.17 10*6/uL (ref 3.70–5.45)
RDW: 12.6 % (ref 11.2–14.5)
WBC: 6.5 10*3/uL (ref 3.9–10.3)

## 2015-09-02 LAB — COMPREHENSIVE METABOLIC PANEL
ALT: <9 U/L (ref 0–55)
ANION GAP: 8 meq/L (ref 3–11)
AST: 15 U/L (ref 5–34)
Albumin: 3.7 g/dL (ref 3.5–5.0)
Alkaline Phosphatase: 51 U/L (ref 40–150)
BUN: 14.5 mg/dL (ref 7.0–26.0)
CALCIUM: 9.9 mg/dL (ref 8.4–10.4)
CHLORIDE: 101 meq/L (ref 98–109)
CO2: 29 meq/L (ref 22–29)
CREATININE: 0.8 mg/dL (ref 0.6–1.1)
EGFR: 72 mL/min/{1.73_m2} — AB (ref >90)
Glucose: 95 mg/dl (ref 70–140)
POTASSIUM: 4.3 meq/L (ref 3.5–5.1)
Sodium: 138 mEq/L (ref 136–145)
Total Bilirubin: <0.3 mg/dL (ref 0.20–1.20)
Total Protein: 6.6 g/dL (ref 6.4–8.3)

## 2015-09-02 MED ORDER — FULVESTRANT 250 MG/5ML IM SOLN
500.0000 mg | Freq: Once | INTRAMUSCULAR | Status: AC
  Administered 2015-09-02: 500 mg via INTRAMUSCULAR
  Filled 2015-09-02: qty 10

## 2015-09-02 MED ORDER — OXYCODONE HCL 5 MG PO TABS: 5.0000 mg | ORAL_TABLET | Freq: Four times a day (QID) | ORAL | Status: DC | PRN

## 2015-09-03 ENCOUNTER — Ambulatory Visit: Payer: Medicare Other

## 2015-09-03 ENCOUNTER — Other Ambulatory Visit: Payer: Medicare Other

## 2015-09-05 DIAGNOSIS — C50911 Malignant neoplasm of unspecified site of right female breast: Secondary | ICD-10-CM | POA: Diagnosis not present

## 2015-09-05 DIAGNOSIS — I2510 Atherosclerotic heart disease of native coronary artery without angina pectoris: Secondary | ICD-10-CM | POA: Diagnosis not present

## 2015-09-05 DIAGNOSIS — I10 Essential (primary) hypertension: Secondary | ICD-10-CM | POA: Diagnosis not present

## 2015-09-05 DIAGNOSIS — J449 Chronic obstructive pulmonary disease, unspecified: Secondary | ICD-10-CM | POA: Diagnosis not present

## 2015-09-05 DIAGNOSIS — F418 Other specified anxiety disorders: Secondary | ICD-10-CM | POA: Diagnosis not present

## 2015-09-05 DIAGNOSIS — R296 Repeated falls: Secondary | ICD-10-CM | POA: Diagnosis not present

## 2015-09-05 DIAGNOSIS — D638 Anemia in other chronic diseases classified elsewhere: Secondary | ICD-10-CM | POA: Diagnosis not present

## 2015-09-05 DIAGNOSIS — J45909 Unspecified asthma, uncomplicated: Secondary | ICD-10-CM | POA: Diagnosis not present

## 2015-09-21 DIAGNOSIS — J45909 Unspecified asthma, uncomplicated: Secondary | ICD-10-CM | POA: Diagnosis not present

## 2015-09-23 ENCOUNTER — Other Ambulatory Visit: Payer: Self-pay | Admitting: Family Medicine

## 2015-09-23 NOTE — Telephone Encounter (Signed)
Last seen 07/31/15 and filled 08/14/15 #45  Please advise     KP

## 2015-09-24 ENCOUNTER — Other Ambulatory Visit: Payer: Self-pay | Admitting: Family Medicine

## 2015-09-27 ENCOUNTER — Other Ambulatory Visit: Payer: Self-pay | Admitting: Family Medicine

## 2015-09-30 ENCOUNTER — Other Ambulatory Visit: Payer: Self-pay | Admitting: *Deleted

## 2015-09-30 DIAGNOSIS — G8929 Other chronic pain: Secondary | ICD-10-CM

## 2015-09-30 DIAGNOSIS — C50511 Malignant neoplasm of lower-outer quadrant of right female breast: Secondary | ICD-10-CM

## 2015-09-30 NOTE — Telephone Encounter (Signed)
Pt called to request a refill on pain medication to be waiting for her tomorrow when she comes in for labs/injection. Advised pt message would be sent to Dr. Jana Hakim.   Medication pended.

## 2015-10-01 ENCOUNTER — Ambulatory Visit (HOSPITAL_BASED_OUTPATIENT_CLINIC_OR_DEPARTMENT_OTHER): Payer: Medicare Other

## 2015-10-01 ENCOUNTER — Other Ambulatory Visit (HOSPITAL_BASED_OUTPATIENT_CLINIC_OR_DEPARTMENT_OTHER): Payer: Medicare Other

## 2015-10-01 VITALS — BP 113/63 | HR 52 | Temp 98.2°F

## 2015-10-01 DIAGNOSIS — C50919 Malignant neoplasm of unspecified site of unspecified female breast: Secondary | ICD-10-CM | POA: Diagnosis not present

## 2015-10-01 DIAGNOSIS — C50911 Malignant neoplasm of unspecified site of right female breast: Secondary | ICD-10-CM | POA: Diagnosis not present

## 2015-10-01 DIAGNOSIS — G8929 Other chronic pain: Secondary | ICD-10-CM

## 2015-10-01 DIAGNOSIS — M810 Age-related osteoporosis without current pathological fracture: Secondary | ICD-10-CM

## 2015-10-01 DIAGNOSIS — Z5111 Encounter for antineoplastic chemotherapy: Secondary | ICD-10-CM

## 2015-10-01 LAB — CBC WITH DIFFERENTIAL/PLATELET
BASO%: 1.2 % (ref 0.0–2.0)
BASOS ABS: 0.1 10*3/uL (ref 0.0–0.1)
EOS ABS: 0.1 10*3/uL (ref 0.0–0.5)
EOS%: 1.5 % (ref 0.0–7.0)
HEMATOCRIT: 37.8 % (ref 34.8–46.6)
HEMOGLOBIN: 12.4 g/dL (ref 11.6–15.9)
LYMPH#: 1.8 10*3/uL (ref 0.9–3.3)
LYMPH%: 31.4 % (ref 14.0–49.7)
MCH: 30 pg (ref 25.1–34.0)
MCHC: 32.8 g/dL (ref 31.5–36.0)
MCV: 91.3 fL (ref 79.5–101.0)
MONO#: 0.4 10*3/uL (ref 0.1–0.9)
MONO%: 7.6 % (ref 0.0–14.0)
NEUT#: 3.3 10*3/uL (ref 1.5–6.5)
NEUT%: 58.3 % (ref 38.4–76.8)
PLATELETS: 201 10*3/uL (ref 145–400)
RBC: 4.14 10*6/uL (ref 3.70–5.45)
RDW: 12.5 % (ref 11.2–14.5)
WBC: 5.7 10*3/uL (ref 3.9–10.3)

## 2015-10-01 LAB — COMPREHENSIVE METABOLIC PANEL
ALT: 11 U/L (ref 0–55)
AST: 20 U/L (ref 5–34)
Albumin: 3.7 g/dL (ref 3.5–5.0)
Alkaline Phosphatase: 45 U/L (ref 40–150)
Anion Gap: 7 mEq/L (ref 3–11)
BILIRUBIN TOTAL: 0.36 mg/dL (ref 0.20–1.20)
BUN: 12.4 mg/dL (ref 7.0–26.0)
CHLORIDE: 101 meq/L (ref 98–109)
CO2: 29 meq/L (ref 22–29)
Calcium: 9.2 mg/dL (ref 8.4–10.4)
Creatinine: 0.8 mg/dL (ref 0.6–1.1)
EGFR: 72 mL/min/{1.73_m2} — AB (ref >90)
GLUCOSE: 104 mg/dL (ref 70–140)
Potassium: 4.6 mEq/L (ref 3.5–5.1)
SODIUM: 137 meq/L (ref 136–145)
TOTAL PROTEIN: 6.4 g/dL (ref 6.4–8.3)

## 2015-10-01 MED ORDER — FULVESTRANT 250 MG/5ML IM SOLN
500.0000 mg | Freq: Once | INTRAMUSCULAR | Status: AC
  Administered 2015-10-01: 500 mg via INTRAMUSCULAR
  Filled 2015-10-01: qty 10

## 2015-10-01 MED ORDER — OXYCODONE HCL 5 MG PO TABS: 5.0000 mg | ORAL_TABLET | Freq: Four times a day (QID) | ORAL | Status: DC | PRN

## 2015-10-02 LAB — CANCER ANTIGEN 27.29: CA 27.29: 18 U/mL (ref 0–39)

## 2015-10-02 NOTE — Progress Notes (Signed)
Cardiology Office Note   Date:  10/03/2015   ID:  Angel [name], DOB [birth year], MRN [id number]  PCP:  [name], DO    Chief Complaint  Patient presents with  . Coronary Artery Disease  . Hypertension      History of Present Illness: Angel [name] is a 82 y.o. female with a hx of non-obstructive CAD with mid LAD 40-50% stenosis, DDD, HL, metastatic breast CA, remote asthma, GERD. She has been on several antianginal meds. Ranolazine was added to her medical regimen but she had to stop it after a few days due to severe fatigue. Patient had significant side effects to metoprolol with fatigue and lethargy and it was stopped. She presents back today for routine followup.She denies any anginal chest pain, LE edema, dizziness, palpitations or syncope. She does have some pain in her chest wall due to scar tissue from metastatic CA. This occurs mainly when she goes to [facility] and pushes a cart.  She also notices some DOE when pushing the cart at [facility] and anything she does with her upper body.  Her main complaint is severe fatigue that she attributes to her breast CA.      Past Medical History  Diagnosis Date  . Asthma   . Spinal stenosis   . Osteoporosis   . Chronic bronchitis   . DDD (degenerative disc disease)   . Osteoarthritis   . Degenerative joint disease   . Coronary artery disease Non-obstructive    a. 2012 Cath: LAD 40-13m->Med Rx;  b. 11/2013 Echo: EF 50-55%, nl wall motion;  c. 12/2013 Lexi MV: EF 74%, no ischemia.  . Depression   . Anxiety   . COPD (chronic obstructive pulmonary disease) ([place])   . Dyslipidemia   . Cholelithiasis   . GERD (gastroesophageal reflux disease)   . Hx of radiation therapy 07/02/1999- 08/11/1999    right supraclavucular/axillary region: 5040 cGy, 28 fractions  . Hx of radiation therapy 02/20/14- 03/27/14    right chest wall 4500 cGy 25 sessions  . Hypertension   . Kidney stones     one stones  . Cataract       eye surgery planned for 11/2014  . PVC's (premature ventricular contractions)   . Reflux esophagitis   . Pre-diabetes   . Skin cancer of face     non melanoma  . Breast cancer ([place]) 05/07/1999, 06/2011    a. s/p bilat mastectomies. recurrence 01/2014. Radiation, meds.  . Chest wall recurrence of breast cancer ([place]) 2016  . Cancer of chest (wall) [facility])     Past Surgical History  Procedure Laterality Date  . Vesicovaginal fistula closure w/ tah  age 56  . Tubal ligation  1961  . Appendectomy    . Abdominal hysterectomy      in her 30's  . Small intestine surgery  1998    [facility]  . Tonsillectomy    . Breast lumpectomy  2002    right breast  . Mastectomy  07/29/11    bilateral-rt nodes-none on left  . Cardiac catheterization  03/2011, 3/15  . Breast biopsy Right 01/21/2014    Procedure: BREAST/CHEST WALL BIOPSY;  Surgeon: [name], MD;  Location: [facility];  Service: General;  Laterality: Right;  . Vein ligation and stripping Right   . Left heart catheterization with coronary angiogram N/A 12/05/2013    Procedure: LEFT HEART CATHETERIZATION WITH CORONARY  ANGIOGRAM;  Surgeon: [name], MD;  Location: [facility] -  CATH LAB;  Service: Cardiovascular;  Laterality: N/A;  . Left heart catheterization with coronary angiogram N/A 11/06/2014    Procedure: LEFT HEART CATHETERIZATION WITH CORONARY ANGIOGRAM;  Surgeon: [name], MD;  Location: [facility] CATH LAB;  Service: Cardiovascular;  Laterality: N/A;     Current Outpatient Prescriptions  Medication Sig Dispense Refill  . acetaminophen (TYLENOL) 650 MG CR tablet Take 650 mg by mouth every 8 (eight) hours as needed for pain.    [name] albuterol (PROVENTIL HFA;VENTOLIN HFA) 108 (90 BASE) MCG/ACT inhaler Inhale 2 puffs into the lungs every 6 (six) hours as needed for wheezing or shortness of breath. 3 Inhaler 0  . Artificial Tear Ointment (DRY EYES OP) Apply 2 drops to eye daily as needed (dry eyes).    [name] aspirin EC 81 MG tablet Take 2  tablets (162 mg total) by mouth daily.    . B Complex-Biotin-FA (B-COMPLEX PO) Take 1 tablet by mouth daily.     . Biotin 5000 MCG CAPS Take 5,000 mcg by mouth daily.    . budesonide-formoterol (SYMBICORT) 160-4.5 MCG/ACT inhaler Inhale 2 puffs into the lungs 2 (two) times daily as needed. (Patient taking differently: Inhale 2 puffs into the lungs 2 (two) times daily as needed (shortness of breath). ) 3 Inhaler 1  . Calcium-Magnesium-Zinc 1000-400-15 MG TABS Take 1 tablet by mouth daily.      . celecoxib (CELEBREX) 200 MG capsule Take 1 capsule daily with food for pain & inflammation 90 capsule 3  . Cholecalciferol (VITAMIN D-3) 5000 UNITS TABS Take 1 tablet by mouth daily.     . citalopram (CELEXA) 40 MG tablet Take 1 tablet (40 mg total) by mouth daily.    . diazepam (VALIUM) 5 MG tablet TAKE ONE-HALF TO ONE TABLET BY MOUTH EVERY 8 HOURS AS NEEDED FOR ANXIETY 45 tablet 0  . DULoxetine (CYMBALTA) 60 MG capsule Take 1 capsule by mouth  every day for mood 90 capsule 1  . ezetimibe (ZETIA) 10 MG tablet Take 1 tablet (10 mg total) by mouth daily.    . Fish Oil OIL Take 1 capsule by mouth daily.     . Flaxseed, Linseed, (GROUND FLAX SEEDS PO) Take 5 mLs by mouth daily.     [name] HYDROcodone-acetaminophen (NORCO) 10-325 MG tablet Take 1 tablet by mouth every 4 (four) hours as needed for moderate pain.    . isosorbide mononitrate (IMDUR) 30 MG 24 hr tablet Take 1 tablet (30 mg total) by mouth daily. 90 tablet 3  . letrozole (FEMARA) 2.5 MG tablet Take 1 tablet (2.5 mg total) by mouth daily. 90 tablet 1  . montelukast (SINGULAIR) 10 MG tablet Take 1 tablet (10 mg total) by mouth at bedtime. 90 tablet 3  . Multiple Vitamin (MULTIVITAMIN) tablet Take 1 tablet by mouth daily.    . nitroGLYCERIN (NITROSTAT) 0.4 MG SL tablet Place 1 tablet (0.4 mg total) under the tongue every 5 (five) minutes x 3 doses as needed for chest pain. 25 tablet 3  . ondansetron (ZOFRAN ODT) 4 MG disintegrating tablet Take 1 tablet (4  mg total) by mouth every 8 (eight) hours as needed for nausea or vomiting. 20 tablet 0  . oxyCODONE (OXY IR/ROXICODONE) 5 MG immediate release tablet Take 1-2 tablets (5-10 mg total) by mouth every 6 (six) hours as needed for severe pain. 60 tablet 0   No current facility-administered medications for this visit.    Allergies:  Penicillins and Adhesive    Social History:  The patient  reports that she has never smoked. She has never used smokeless tobacco. She reports that she drinks alcohol. She reports that she does not use illicit drugs.   Family History:  The patient's family history includes Asthma in her brother, brother, daughter, and grandchild; Cancer in her mother; Emphysema in her father; Heart disease in her brother and father; Hypertension in her daughter; Lymphoma in her brother; Pancreatic cancer in her mother; Stroke in her daughter.    ROS:  Please see the history of present illness.   Otherwise, review of systems are positive for none.   All other systems are reviewed and negative.    PHYSICAL EXAM: VS:  BP 104/84 mmHg  Pulse 42  Ht 5\' 2"  (1.575 m)  Wt 130 lb 6.4 oz (59.149 kg)  BMI 23.84 kg/m2  SpO2 95% , BMI Body mass index is 23.84 kg/(m^2). GEN: Well nourished, well developed, in no acute distress HEENT: normal Neck: no JVD, carotid bruits, or masses Cardiac: RRR; no murmurs, rubs, or gallops,no edema  Respiratory:  clear to auscultation bilaterally, normal work of breathing GI: soft, nontender, nondistended, + BS MS: no deformity or atrophy Skin: warm and dry, no rash Neuro:  Strength and sensation are intact Psych: euthymic mood, full affect   EKG:  EKG is not ordered today.    Recent Labs: 12/13/2014: B Natriuretic Peptide 61.6 10/01/2015: ALT 11; BUN 12.4; Creatinine 0.8; HGB 12.4; Platelets 201; Potassium 4.6; Sodium 137    Lipid Panel    Component Value Date/Time   CHOL 199 04/25/2015 0938   TRIG 96.0 04/25/2015 0938   HDL 58.60 04/25/2015  0938   CHOLHDL 3 04/25/2015 0938   VLDL 19.2 04/25/2015 0938   LDLCALC 121* 04/25/2015 0938      Wt Readings from Last 3 Encounters:  10/03/15 130 lb 6.4 oz (59.149 kg)  08/06/15 126 lb 6.4 oz (57.335 kg)  07/31/15 123 lb (55.792 kg)     ASSESSMENT AND PLAN:  1. Chest Pain: Resolved.  She does not have any anginal CP at present and only has chest wall pain from using her upper body.   2. Nonobstructive CAD: Continue aspirin. She is intolerant of statin and ranexa. Continue ASA/Imdur. 3. Hyperlipidemia: Last LDL not at goal (goal <70) at 121.  Continue Zetia. Statin intolerant. Recheck FLP and ALT and if LDL not at goal then refer to lipid clinic to determine if she is a candidate for PCSK 9 drug 4.PVCs: She is asymptomatic. She has normal LV function. 5. HTN well controlled  Current medicines are reviewed at length with the patient today.  The patient does not have concerns regarding medicines.  The following changes have been made:  no change  Labs/ tests ordered today: See above Assessment and Plan No orders of the defined types were placed in this encounter.     Disposition:   FU with me in 6 months  Signed, [name], MD  10/03/2015 10:34 AM    [facility] [address], [place], [place]  [address] Phone: [phone]; Fax: [phone]

## 2015-10-03 ENCOUNTER — Encounter: Payer: Self-pay | Admitting: Cardiology

## 2015-10-03 ENCOUNTER — Ambulatory Visit (INDEPENDENT_AMBULATORY_CARE_PROVIDER_SITE_OTHER): Payer: Medicare Other | Admitting: Cardiology

## 2015-10-03 VITALS — BP 104/84 | HR 42 | Ht 62.0 in | Wt 130.4 lb

## 2015-10-03 DIAGNOSIS — I2510 Atherosclerotic heart disease of native coronary artery without angina pectoris: Secondary | ICD-10-CM

## 2015-10-03 DIAGNOSIS — I10 Essential (primary) hypertension: Secondary | ICD-10-CM | POA: Diagnosis not present

## 2015-10-03 DIAGNOSIS — E785 Hyperlipidemia, unspecified: Secondary | ICD-10-CM

## 2015-10-03 DIAGNOSIS — I2583 Coronary atherosclerosis due to lipid rich plaque: Principal | ICD-10-CM

## 2015-10-03 LAB — HEPATIC FUNCTION PANEL
ALK PHOS: 34 U/L (ref 33–130)
ALT: 9 U/L (ref 6–29)
AST: 17 U/L (ref 10–35)
Albumin: 3.7 g/dL (ref 3.6–5.1)
BILIRUBIN DIRECT: 0.1 mg/dL (ref <=0.2)
BILIRUBIN TOTAL: 0.4 mg/dL (ref 0.2–1.2)
Indirect Bilirubin: 0.3 mg/dL (ref 0.2–1.2)
Total Protein: 6.1 g/dL (ref 6.1–8.1)

## 2015-10-03 LAB — LIPID PANEL
CHOL/HDL RATIO: 3.7 ratio (ref <=5.0)
Cholesterol: 192 mg/dL (ref 125–200)
HDL: 52 mg/dL (ref >=46)
LDL Cholesterol: 119 mg/dL (ref <130)
Triglycerides: 104 mg/dL (ref <150)
VLDL: 21 mg/dL (ref <30)

## 2015-10-03 NOTE — Patient Instructions (Signed)
Medication Instructions:  Your physician recommends that you continue on your current medications as directed. Please refer to the Current Medication list given to you today.   Labwork: TODAY: LFTs, Lipids   Testing/Procedures: None  Follow-Up: Your physician wants you to follow-up in: 6 months with Dr. Turner. You will receive a reminder letter in the mail two months in advance. If you don't receive a letter, please call our office to schedule the follow-up appointment.   Any Other Special Instructions Will Be Listed Below (If Applicable).     If you need a refill on your cardiac medications before your next appointment, please call your pharmacy.   

## 2015-10-08 ENCOUNTER — Telehealth: Payer: Self-pay

## 2015-10-08 DIAGNOSIS — H35371 Puckering of macula, right eye: Secondary | ICD-10-CM | POA: Diagnosis not present

## 2015-10-08 DIAGNOSIS — E785 Hyperlipidemia, unspecified: Secondary | ICD-10-CM

## 2015-10-08 DIAGNOSIS — H179 Unspecified corneal scar and opacity: Secondary | ICD-10-CM | POA: Diagnosis not present

## 2015-10-08 DIAGNOSIS — H2513 Age-related nuclear cataract, bilateral: Secondary | ICD-10-CM | POA: Diagnosis not present

## 2015-10-08 NOTE — Telephone Encounter (Signed)
-----   Message from [name], MD sent at 10/07/2015 10:32 PM EST ----- Please set up repeat lipids in 4 months

## 2015-10-08 NOTE — Telephone Encounter (Signed)
Left message for patient to check MyChart message for instructions. Sent message in lab results to patient that repeat fasting lab work will be drawn in 4 months. Recall set. Instructed patient to call back with further questions or concerns.

## 2015-10-15 DIAGNOSIS — M542 Cervicalgia: Secondary | ICD-10-CM | POA: Diagnosis not present

## 2015-10-15 DIAGNOSIS — M5136 Other intervertebral disc degeneration, lumbar region: Secondary | ICD-10-CM | POA: Diagnosis not present

## 2015-10-21 ENCOUNTER — Telehealth: Payer: Self-pay

## 2015-10-21 NOTE — Telephone Encounter (Signed)
Patient called to request medication for cholesterol. Her insurance company told her that with her heart problems, she should be on one. Scheduled patient tomorrow at 1130 with Gay Filler, [facility] in the [facility].

## 2015-10-22 ENCOUNTER — Ambulatory Visit: Payer: Medicare Other | Admitting: Pharmacist

## 2015-10-22 DIAGNOSIS — J45909 Unspecified asthma, uncomplicated: Secondary | ICD-10-CM | POA: Diagnosis not present

## 2015-10-22 NOTE — Telephone Encounter (Signed)
Pt called and canceled the appt with a note made that she would call back to reschedule.

## 2015-10-29 ENCOUNTER — Other Ambulatory Visit: Payer: Self-pay | Admitting: Family Medicine

## 2015-10-29 ENCOUNTER — Ambulatory Visit (HOSPITAL_BASED_OUTPATIENT_CLINIC_OR_DEPARTMENT_OTHER): Payer: Medicare Other

## 2015-10-29 ENCOUNTER — Telehealth: Payer: Self-pay | Admitting: Oncology

## 2015-10-29 ENCOUNTER — Other Ambulatory Visit (HOSPITAL_BASED_OUTPATIENT_CLINIC_OR_DEPARTMENT_OTHER): Payer: Medicare Other

## 2015-10-29 ENCOUNTER — Ambulatory Visit (HOSPITAL_BASED_OUTPATIENT_CLINIC_OR_DEPARTMENT_OTHER): Payer: Medicare Other | Admitting: Nurse Practitioner

## 2015-10-29 VITALS — BP 96/73 | HR 72 | Temp 97.5°F | Resp 18 | Ht 62.0 in | Wt 121.8 lb

## 2015-10-29 DIAGNOSIS — G8929 Other chronic pain: Secondary | ICD-10-CM

## 2015-10-29 DIAGNOSIS — C50911 Malignant neoplasm of unspecified site of right female breast: Secondary | ICD-10-CM

## 2015-10-29 DIAGNOSIS — C50919 Malignant neoplasm of unspecified site of unspecified female breast: Secondary | ICD-10-CM

## 2015-10-29 DIAGNOSIS — C7989 Secondary malignant neoplasm of other specified sites: Secondary | ICD-10-CM | POA: Diagnosis not present

## 2015-10-29 DIAGNOSIS — M810 Age-related osteoporosis without current pathological fracture: Secondary | ICD-10-CM

## 2015-10-29 DIAGNOSIS — Z170 Estrogen receptor positive status [ER+]: Secondary | ICD-10-CM | POA: Diagnosis not present

## 2015-10-29 DIAGNOSIS — C50411 Malignant neoplasm of upper-outer quadrant of right female breast: Secondary | ICD-10-CM

## 2015-10-29 DIAGNOSIS — C50511 Malignant neoplasm of lower-outer quadrant of right female breast: Secondary | ICD-10-CM

## 2015-10-29 DIAGNOSIS — Z5111 Encounter for antineoplastic chemotherapy: Secondary | ICD-10-CM

## 2015-10-29 LAB — CBC WITH DIFFERENTIAL/PLATELET
BASO%: 0.6 % (ref 0.0–2.0)
Basophils Absolute: 0 10*3/uL (ref 0.0–0.1)
EOS%: 0.7 % (ref 0.0–7.0)
Eosinophils Absolute: 0 10*3/uL (ref 0.0–0.5)
HEMATOCRIT: 43.1 % (ref 34.8–46.6)
HEMOGLOBIN: 14.2 g/dL (ref 11.6–15.9)
LYMPH#: 2.5 10*3/uL (ref 0.9–3.3)
LYMPH%: 36.9 % (ref 14.0–49.7)
MCH: 30.4 pg (ref 25.1–34.0)
MCHC: 33 g/dL (ref 31.5–36.0)
MCV: 92.2 fL (ref 79.5–101.0)
MONO#: 0.5 10*3/uL (ref 0.1–0.9)
MONO%: 7.7 % (ref 0.0–14.0)
NEUT%: 54.1 % (ref 38.4–76.8)
NEUTROS ABS: 3.7 10*3/uL (ref 1.5–6.5)
PLATELETS: 238 10*3/uL (ref 145–400)
RBC: 4.68 10*6/uL (ref 3.70–5.45)
RDW: 13 % (ref 11.2–14.5)
WBC: 6.8 10*3/uL (ref 3.9–10.3)

## 2015-10-29 LAB — COMPREHENSIVE METABOLIC PANEL
ALBUMIN: 4.1 g/dL (ref 3.5–5.0)
ALK PHOS: 40 U/L (ref 40–150)
ALT: 12 U/L (ref 0–55)
ANION GAP: 8 meq/L (ref 3–11)
AST: 18 U/L (ref 5–34)
BILIRUBIN TOTAL: 0.51 mg/dL (ref 0.20–1.20)
BUN: 16.3 mg/dL (ref 7.0–26.0)
CO2: 27 mEq/L (ref 22–29)
Calcium: 9.5 mg/dL (ref 8.4–10.4)
Chloride: 101 mEq/L (ref 98–109)
Creatinine: 0.8 mg/dL (ref 0.6–1.1)
EGFR: 66 mL/min/{1.73_m2} — AB (ref >90)
Glucose: 96 mg/dl (ref 70–140)
POTASSIUM: 4.6 meq/L (ref 3.5–5.1)
SODIUM: 136 meq/L (ref 136–145)
TOTAL PROTEIN: 7.3 g/dL (ref 6.4–8.3)

## 2015-10-29 MED ORDER — OXYCODONE HCL 5 MG PO TABS: 5.0000 mg | ORAL_TABLET | Freq: Four times a day (QID) | ORAL | Status: DC | PRN

## 2015-10-29 MED ORDER — FULVESTRANT 250 MG/5ML IM SOLN
500.0000 mg | Freq: Once | INTRAMUSCULAR | Status: AC
  Administered 2015-10-29: 500 mg via INTRAMUSCULAR
  Filled 2015-10-29: qty 10

## 2015-10-29 NOTE — Telephone Encounter (Signed)
Requesting- Diazepam 5mg -Take one-half to one tablet by mouth every 8 hours as needed for anxiety. Last refill:09/23/15;#45,0 Last OV:07/31/15 No UDS Please advise.//AB/CMA

## 2015-10-29 NOTE — Telephone Encounter (Signed)
Appointments made and avs printed for patient °

## 2015-10-29 NOTE — Telephone Encounter (Signed)
appts made and avs printed for pt °

## 2015-10-29 NOTE — Progress Notes (Signed)
Angel [name]  Telephone:([phone]) [phone] Fax:(336) [phone]   ID: Angel [name] DOB: 02-23-1932  MR#: [id number]  OIZ#:[id number]  Patient Care Team: [name], DO as PCP - General (Family Medicine) [name], MD as Consulting Physician (Oncology) [name], MD as Consulting Physician (Cardiology) OTHER M.D.: Maisie Fus Kurup (ophth)  CHIEF COMPLAINT: Chest wall recurrence of breast cancer CURRENT TREATMENT: Fulvestrant, Denosumab  BREAST CANCER HISTORY: Per Dr. 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 She had a second right breast cancer in 2012, with right mastectomy by Dr [name] 07-29-2011 for T1N0M0 ER/PR positive, HER-2 negative invasive ductal carcinoma; she also had what was planned as prophylactic left mastectomy, with unexpected finding of left DCIS in that pathology. Plan in 09-2011 was to begin tamoxifen, chosen particularly due to low bone density. Patient did not keep follow up visits at this office, stopped tamoxifen due intolerance (tho she does not remember what problem she had with this) and was treated with raloxifene by Dr [name].  She was seen next at this office on 12-26-2013 with an enlarging nodular area above right mastectomy scar,reportedly present for several months. Excisional biopsy done 01-21-14 found grade 2 invasive ductal carcinoma with involvement at [facility]) and lymphovascular space invasion, ER positive 100%, PR positive 75%, proliferation marker 80% and HER-2 negative by [facility] (pathology [phone]). PET 02-15-14 did not identify any involvement beyond right chest wall. She had 4500 cGy as electron beam radiation from 5-27 thru 03-27-14 to right chest wall. By 04-30-14 there were multiple tiny nodules in region of right mastectomy scar."  The patient's subsequent history is as detailed below  INTERVAL  HISTORY: Angel [name] returns today for follow up of her recurrent breast cancer, accompanied by her daughter Angel [name]. Angel [name] continue on fulvestrant injections monthly. She did not realize that we discontinued the letrozole last year when we discovered there was progression and has been taking this daily as well. She understands now that she is to be on fulvestrant alone. She tolerates this well with no side effects. She also receives denosumab every 6 months.   REVIEW OF SYSTEMS: Angel [name] denies fevers, chills, nausea or vomiting. She takes sennakot daily for her constipation. Her main complaint is chronic back pain. She works with Dr. Nelva Bush who performs injections every 4 months to the affected areas. He also prescribes her norco. Due to manufacturers removing an (inactive) ingredient, [name] this medicine has been less effective. She has been leaning heavily on the oxycodone 5-36m we prescribe her for her chest wall pain since her double mastectomies. The pain keeps her up at night and she is uncomfortable all of the time. Otherwise she moves around well, using a cane to ambulate. She denies shortness of breath, chest pain, cough, or palpitations. She denies headaches but has cataracts and this obviously affects her vision. She is anxious at times. A detailed review of systems is otherwise stable.   PAST MEDICAL HISTORY: Past Medical History  Diagnosis Date  . Asthma   . Spinal stenosis   . Osteoporosis   . Chronic bronchitis   . DDD (degenerative disc disease)   . Osteoarthritis   . Degenerative joint disease   . Coronary artery disease Non-obstructive    a. 2012 Cath: LAD 40-580mMed Rx;  b. 11/2013 Echo: EF 50-55%, nl wall motion;  c. 12/2013 Lexi MV: EF 74%, no ischemia.  . Depression   .  Anxiety   . COPD (chronic obstructive pulmonary disease) ([place])   . Dyslipidemia   . Cholelithiasis   . GERD (gastroesophageal reflux disease)   . Hx of radiation therapy 07/02/1999- 08/11/1999     right supraclavucular/axillary region: 5040 cGy, 28 fractions  . Hx of radiation therapy 02/20/14- 03/27/14    right chest wall 4500 cGy 25 sessions  . Hypertension   . Kidney stones     one stones  . Cataract     eye surgery planned for 11/2014  . PVC's (premature ventricular contractions)   . Reflux esophagitis   . Pre-diabetes   . Skin cancer of face     non melanoma  . Breast cancer ([place]) 05/07/1999, 06/2011    a. s/p bilat mastectomies. recurrence 01/2014. Radiation, meds.  . Chest wall recurrence of breast cancer ([place]) 2016  . Cancer of chest (wall) ([place])     PAST SURGICAL HISTORY: Past Surgical History  Procedure Laterality Date  . Vesicovaginal fistula closure w/ tah  age 8  . Tubal ligation  1961  . Appendectomy    . Abdominal hysterectomy      in her 30's  . Small intestine surgery  1998    [facility]  . Tonsillectomy    . Breast lumpectomy  2002    right breast  . Mastectomy  07/29/11    bilateral-rt nodes-none on left  . Cardiac catheterization  03/2011, 3/15  . Breast biopsy Right 01/21/2014    Procedure: BREAST/CHEST WALL BIOPSY;  Surgeon: [name], MD;  Location: [place];  Service: General;  Laterality: Right;  . Vein ligation and stripping Right   . Left heart catheterization with coronary angiogram N/A 12/05/2013    Procedure: LEFT HEART CATHETERIZATION WITH CORONARY ANGIOGRAM;  Surgeon: [name], MD;  Location: [facility] CATH LAB;  Service: Cardiovascular;  Laterality: N/A;  . Left heart catheterization with coronary angiogram N/A 11/06/2014    Procedure: LEFT HEART CATHETERIZATION WITH CORONARY ANGIOGRAM;  Surgeon: [name], MD;  Location: [facility] CATH LAB;  Service: Cardiovascular;  Laterality: N/A;    FAMILY HISTORY Family History  Problem Relation Age of Onset  . Pancreatic cancer Mother   . Cancer Mother     pancreatic  . Asthma Brother   . Asthma Daughter   . Stroke Daughter   . Hypertension Daughter   . Asthma Grandchild   . Asthma  Brother     deceased  . Heart disease Father   . Emphysema Father   . Heart disease Brother   . Lymphoma Brother    the patient's father died at the age of 53 from heart disease. He was a smoker. The patient's mother died at the age of 57 with cancer of the pancreas. The patient has 3 brothers, no sisters. There is no history of breast or ovarian cancer in the family to her knowledge.  GYNECOLOGIC HISTORY:  No LMP recorded. Patient has had a hysterectomy. Menarche age 82, first live birth age 64. She is GX P4. She stopped having periods in her 61s. She took hormone replacement for approximately 10 years. 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 She has been "single" for 40 years. Her oldest daughter died from a subarachnoid hemorrhage at age 65, 2 years ago. This is when the patient had undergone treatment for invasive breast cancer and she tells me she was simply overwhelmed  and could not start the planned antiestrogen therapy. The next daughter, Angel [name], is a retired Software engineer. She lives in [place]. Next child, Angel [name], works in Charity fundraiser. She also lives in [place]. The youngest, Angel [name], is retired from the Office manager position. He also owned his own business. The patient has 7 grandchildren and 2 many great-grandchildren to count. She attends the [employer]    ADVANCED DIRECTIVES: In place; the patient's daughter Angel [name] is her healthcare power of attorney. She can be reached at [place]: Social History  Substance Use Topics  . Smoking status: Never Smoker   . Smokeless tobacco: Never Used  . Alcohol Use: Yes     Comment: occ wine     Colonoscopy:  PAP:  Bone density: August 2015   Lipid panel:  Allergies  Allergen Reactions  . Penicillins Rash    Has patient had a PCN reaction causing immediate rash,  facial/tongue/throat swelling, SOB or lightheadedness with hypotension: Yes Has patient had a PCN reaction causing severe rash involving mucus membranes or skin necrosis: No Has patient had a PCN reaction that required hospitalization No Has patient had a PCN reaction occurring within the last 10 years: No If all of the above answers are "NO", then may proceed with Cephalosporin use.   . Adhesive [Tape] Other (See Comments)    Pt prefers to use paper tape    Current Outpatient Prescriptions  Medication Sig Dispense Refill  . Artificial Tear Ointment (DRY EYES OP) Apply 2 drops to eye daily as needed (dry eyes).    [name] aspirin EC 81 MG tablet Take 2 tablets (162 mg total) by mouth daily.    . B Complex-Biotin-FA (B-COMPLEX PO) Take 1 tablet by mouth daily.     . Biotin 5000 MCG CAPS Take 5,000 mcg by mouth daily.    . budesonide-formoterol (SYMBICORT) 160-4.5 MCG/ACT inhaler Inhale 2 puffs into the lungs 2 (two) times daily as needed. (Patient taking differently: Inhale 2 puffs into the lungs 2 (two) times daily as needed (shortness of breath). ) 3 Inhaler 1  . Calcium-Magnesium-Zinc 1000-400-15 MG TABS Take 1 tablet by mouth daily.      . celecoxib (CELEBREX) 200 MG capsule Take 1 capsule daily with food for pain & inflammation 90 capsule 3  . Cholecalciferol (VITAMIN D-3) 5000 UNITS TABS Take 1 tablet by mouth daily.     . citalopram (CELEXA) 40 MG tablet Take 1 tablet (40 mg total) by mouth daily.    . diazepam (VALIUM) 5 MG tablet TAKE ONE-HALF TO ONE TABLET BY MOUTH EVERY 8 HOURS AS NEEDED FOR ANXIETY 45 tablet 0  . DULoxetine (CYMBALTA) 60 MG capsule Take 1 capsule by mouth  every day for mood 90 capsule 1  . ezetimibe (ZETIA) 10 MG tablet Take 1 tablet (10 mg total) by mouth daily.    . Fish Oil OIL Take 1 capsule by mouth daily.     . Flaxseed, Linseed, (GROUND FLAX SEEDS PO) Take 5 mLs by mouth daily.     [name] HYDROcodone-acetaminophen (NORCO) 10-325 MG tablet Take 2 tablets by mouth  every 4 (four) hours as needed for moderate pain.     . isosorbide mononitrate (IMDUR) 30 MG 24 hr tablet Take 1 tablet (30 mg total) by mouth daily. 90 tablet 3  . letrozole (FEMARA) 2.5 MG tablet Take 1 tablet (2.5 mg total) by mouth daily. 90 tablet 1  . montelukast (SINGULAIR) 10 MG tablet Take 1 tablet (10 mg total)  by mouth at bedtime. 90 tablet 3  . Multiple Vitamin (MULTIVITAMIN) tablet Take 1 tablet by mouth daily.    . ondansetron (ZOFRAN ODT) 4 MG disintegrating tablet Take 1 tablet (4 mg total) by mouth every 8 (eight) hours as needed for nausea or vomiting. 20 tablet 0  . oxyCODONE (OXY IR/ROXICODONE) 5 MG immediate release tablet Take 1-2 tablets (5-10 mg total) by mouth every 6 (six) hours as needed for severe pain. 120 tablet 0  . acetaminophen (TYLENOL) 650 MG CR tablet Take 650 mg by mouth every 8 (eight) hours as needed for pain. Reported on 10/29/2015    . albuterol (PROVENTIL HFA;VENTOLIN HFA) 108 (90 BASE) MCG/ACT inhaler Inhale 2 puffs into the lungs every 6 (six) hours as needed for wheezing or shortness of breath. (Patient not taking: Reported on 10/29/2015) 3 Inhaler 0  . nitroGLYCERIN (NITROSTAT) 0.4 MG SL tablet Place 1 tablet (0.4 mg total) under the tongue every 5 (five) minutes x 3 doses as needed for chest pain. (Patient not taking: Reported on 10/29/2015) 25 tablet 3   No current facility-administered medications for this visit.    OBJECTIVE: Elderly white woman in no acute distress  Filed Vitals:   10/29/15 1139  BP: 96/73  Pulse: 72  Temp: 97.5 F (36.4 C)  Resp: 18     Body mass index is 22.27 kg/(m^2).    ECOG FS:1 - Symptomatic but completely ambulatory  Skin: warm, dry  HEENT: sclerae anicteric, conjunctivae pink, oropharynx clear. No thrush or mucositis.  Lymph Nodes: No cervical or supraclavicular lymphadenopathy  Lungs: clear to auscultation bilaterally, no rales, wheezes, or rhonci  Heart: regular rate and rhythm  Abdomen: round, soft, non tender,  positive bowel sounds  Musculoskeletal: No focal spinal tenderness, no peripheral edema  Neuro: non focal, well oriented, positive affect  Breasts: bilateral breasts status post mastectomies. No evidence of recurrent disease. Bilateral axillae benign.    LAB RESULTS:  CMP     Component Value Date/Time   NA 136 10/29/2015 1127   NA 137 07/23/2015 1407   K 4.6 10/29/2015 1127   K 4.1 07/23/2015 1407   CL 104 07/23/2015 1407   CO2 27 10/29/2015 1127   CO2 27 07/23/2015 1407   GLUCOSE 96 10/29/2015 1127   GLUCOSE 93 07/23/2015 1407   BUN 16.3 10/29/2015 1127   BUN 12 07/23/2015 1407   CREATININE 0.8 10/29/2015 1127   CREATININE 0.63 07/23/2015 1407   CREATININE 0.78 05/01/2014 1050   CALCIUM 9.5 10/29/2015 1127   CALCIUM 8.7* 07/23/2015 1407   PROT 7.3 10/29/2015 1127   PROT 6.1 10/03/2015 1102   ALBUMIN 4.1 10/29/2015 1127   ALBUMIN 3.7 10/03/2015 1102   AST 18 10/29/2015 1127   AST 17 10/03/2015 1102   ALT 12 10/29/2015 1127   ALT 9 10/03/2015 1102   ALKPHOS 40 10/29/2015 1127   ALKPHOS 34 10/03/2015 1102   BILITOT 0.51 10/29/2015 1127   BILITOT 0.4 10/03/2015 1102   GFRNONAA >60 07/23/2015 1407   GFRNONAA 73 05/01/2014 1050   GFRAA >60 07/23/2015 1407   GFRAA 84 05/01/2014 1050    I No results found for: SPEP  Lab Results  Component Value Date   WBC 6.8 10/29/2015   NEUTROABS 3.7 10/29/2015   HGB 14.2 10/29/2015   HCT 43.1 10/29/2015   MCV 92.2 10/29/2015   PLT 238 10/29/2015      Chemistry      Component Value Date/Time   NA 136 10/29/2015 1127  NA 137 07/23/2015 1407   K 4.6 10/29/2015 1127   K 4.1 07/23/2015 1407   CL 104 07/23/2015 1407   CO2 27 10/29/2015 1127   CO2 27 07/23/2015 1407   BUN 16.3 10/29/2015 1127   BUN 12 07/23/2015 1407   CREATININE 0.8 10/29/2015 1127   CREATININE 0.63 07/23/2015 1407   CREATININE 0.78 05/01/2014 1050      Component Value Date/Time   CALCIUM 9.5 10/29/2015 1127   CALCIUM 8.7* 07/23/2015 1407   ALKPHOS  40 10/29/2015 1127   ALKPHOS 34 10/03/2015 1102   AST 18 10/29/2015 1127   AST 17 10/03/2015 1102   ALT 12 10/29/2015 1127   ALT 9 10/03/2015 1102   BILITOT 0.51 10/29/2015 1127   BILITOT 0.4 10/03/2015 1102       Lab Results  Component Value Date   LABCA2 18 10/01/2015    No components found for: [id number]  No results for input(s): INR in the last 168 hours.  Urinalysis    Component Value Date/Time   COLORURINE YELLOW 02/14/2015 2010   APPEARANCEUR CLOUDY* 02/14/2015 2010   LABSPEC 1.008 02/14/2015 2010   PHURINE 6.5 02/14/2015 2010   [place] NEGATIVE 02/14/2015 2010   HGBUR MODERATE* 02/14/2015 2010   BILIRUBINUR neg 04/25/2015 1015   [place] NEGATIVE 02/14/2015 2010   [place] NEGATIVE 02/14/2015 2010   PROTEINUR neg 04/25/2015 1015   PROTEINUR NEGATIVE 02/14/2015 2010   UROBILINOGEN 0.2 04/25/2015 1015   UROBILINOGEN 0.2 02/14/2015 2010   NITRITE neg 04/25/2015 1015   NITRITE NEGATIVE 02/14/2015 2010   LEUKOCYTESUR Negative 04/25/2015 1015    STUDIES: No results found.   ASSESSMENT: 82 y.o. [place] woman with locally recurrent breast cancer, as follows:  (1) status post right lumpectomy and sentinel node sampling September 2000 for ductal carcinoma in situ (Ni+), estrogen and progesterone receptor positive, with a low proliferation index,  (a) received 5040 cGy to the right breast and regional lymph nodes  (b) on tamoxifen less than 3 months  (2) s/p bilateral mastectomies and right axillary lymph node sampling 07/29/2011, showing  (a) in the left breast, ductal carcinoma in situ measuring 1.2 cm, grade 1, with negative margins, estrogen and progesterone receptor positive  (b)  on the right, a pT1c pN0, stage IA invasive ductal carcinoma, estrogen receptor and 93% positive, progesterone receptor 87% positive, with an MIB-1 of 35%, and no HER-2 amplification margins were close but negative  (c) did not receive adjuvant antiestrogen therapy  (3)  right chest wall skin recurrence resected 01/21/2014, measuring 1.7 cm, with positive margins, estrogen receptor 100% positive, progesterone receptor 75% positive, with an MIB-1 of 80%, and no HER-2 amplification; PET scan 02/15/2014 negative except for Right chest wall focus  (a) received 45 Gy electron-beam therapy to the right chest wall completed 03/27/2014  (b) skin progression within and without the radiation field noted 04/30/2014  (c) letrozole started 05/13/2014, discontinued March 2016 with progression   (4) dexa scan 05/09/2014 at [facility] shows osteoporosis with a T score of -2.9  (5) Fulvestrant started 12/25/2014  (6) osteopenia, on DEXA scan 05/09/2014  (a) to start Denosumab/prolia 08/11/2015  PLAN: Doretta's main concerns have little to do with her breast cancer. She is having chronic pain to her back, unrelieved by the norco prescribed by her orthopedic specialist. Dr. Jana Hakim gives her oxycodone 5-61m (she always uses 164m every 6 hours. She is asking for a refill, since she uses this for her chest wall pain as well. I refilled  it, but I urged her to discuss her pain with Dr. Nelva Bush as well. Previously she was using our prescription lightly. He needs to know that she is going through 6-8 tablets of this daily now.   As far as her breast cancer is concerned she is stable. She will continue fulvestrant injections monthly. Her next denosumab injection is due in May.   Tashona will return in 3 months for her next visit with Dr. Jana Hakim. Prior to this exam she will have a repeat bone scan. She understands and agrees with this plan. She has been encouraged to call with any issues that might arise before her next visit here.  [name]  Laurie Panda, NP   10/29/2015 4:56 PM

## 2015-10-30 ENCOUNTER — Encounter: Payer: Self-pay | Admitting: Nurse Practitioner

## 2015-10-30 LAB — CANCER ANTIGEN 27-29 (PARALLEL TESTING): CA 27.29: 14 U/mL (ref 0–39)

## 2015-10-31 LAB — CANCER ANTIGEN 27.29: CAN 27.29: 26.2 U/mL (ref 0.0–38.6)

## 2015-11-05 DIAGNOSIS — Z83518 Family history of other specified eye disorder: Secondary | ICD-10-CM | POA: Diagnosis not present

## 2015-11-05 DIAGNOSIS — H2511 Age-related nuclear cataract, right eye: Secondary | ICD-10-CM | POA: Diagnosis not present

## 2015-11-05 DIAGNOSIS — Z9013 Acquired absence of bilateral breasts and nipples: Secondary | ICD-10-CM | POA: Diagnosis not present

## 2015-11-05 DIAGNOSIS — Z8589 Personal history of malignant neoplasm of other organs and systems: Secondary | ICD-10-CM | POA: Diagnosis not present

## 2015-11-05 DIAGNOSIS — Z9889 Other specified postprocedural states: Secondary | ICD-10-CM | POA: Diagnosis not present

## 2015-11-05 DIAGNOSIS — Z923 Personal history of irradiation: Secondary | ICD-10-CM | POA: Diagnosis not present

## 2015-11-05 DIAGNOSIS — Z853 Personal history of malignant neoplasm of breast: Secondary | ICD-10-CM | POA: Diagnosis not present

## 2015-11-05 DIAGNOSIS — J45909 Unspecified asthma, uncomplicated: Secondary | ICD-10-CM | POA: Diagnosis not present

## 2015-11-05 DIAGNOSIS — Z83511 Family history of glaucoma: Secondary | ICD-10-CM | POA: Diagnosis not present

## 2015-11-05 DIAGNOSIS — I2510 Atherosclerotic heart disease of native coronary artery without angina pectoris: Secondary | ICD-10-CM | POA: Diagnosis not present

## 2015-11-05 DIAGNOSIS — Z7951 Long term (current) use of inhaled steroids: Secondary | ICD-10-CM | POA: Diagnosis not present

## 2015-11-05 DIAGNOSIS — Z79899 Other long term (current) drug therapy: Secondary | ICD-10-CM | POA: Diagnosis not present

## 2015-11-05 DIAGNOSIS — Z79891 Long term (current) use of opiate analgesic: Secondary | ICD-10-CM | POA: Diagnosis not present

## 2015-11-05 DIAGNOSIS — Z9221 Personal history of antineoplastic chemotherapy: Secondary | ICD-10-CM | POA: Diagnosis not present

## 2015-11-05 DIAGNOSIS — Z7982 Long term (current) use of aspirin: Secondary | ICD-10-CM | POA: Diagnosis not present

## 2015-11-06 ENCOUNTER — Other Ambulatory Visit: Payer: Self-pay | Admitting: Family Medicine

## 2015-11-07 ENCOUNTER — Telehealth: Payer: Self-pay | Admitting: Family Medicine

## 2015-11-07 ENCOUNTER — Telehealth: Payer: Self-pay | Admitting: Oncology

## 2015-11-07 NOTE — Telephone Encounter (Signed)
Relation to [id number] Call back number:[phone] Pharmacy: [facility] [place], [place].  Reason for call:  Patient requesting a refill diazepam (VALIUM) 5 MG tablet

## 2015-11-07 NOTE — Telephone Encounter (Signed)
Pharmacy did not receive the fax, but had me on hold over 10 minutes for the pharmacist. I called and left the Rf authorization on the VM.    KP

## 2015-11-07 NOTE — Telephone Encounter (Signed)
Caller name: Self  Can be reached: [phone]  Reason for call: Patient called stating that she went to pick up her Xanax from the pharmacy and was told that it had been denied by her PCP.

## 2015-11-07 NOTE — Telephone Encounter (Signed)
Patient called and moved 3/1 lab/inj to 12:45 pm. Patient has new time.

## 2015-11-22 DIAGNOSIS — J45909 Unspecified asthma, uncomplicated: Secondary | ICD-10-CM | POA: Diagnosis not present

## 2015-11-25 ENCOUNTER — Telehealth: Payer: Self-pay | Admitting: *Deleted

## 2015-11-25 DIAGNOSIS — Z7982 Long term (current) use of aspirin: Secondary | ICD-10-CM | POA: Diagnosis not present

## 2015-11-25 DIAGNOSIS — Z79899 Other long term (current) drug therapy: Secondary | ICD-10-CM | POA: Diagnosis not present

## 2015-11-25 DIAGNOSIS — H25813 Combined forms of age-related cataract, bilateral: Secondary | ICD-10-CM | POA: Diagnosis not present

## 2015-11-25 DIAGNOSIS — H25811 Combined forms of age-related cataract, right eye: Secondary | ICD-10-CM | POA: Diagnosis not present

## 2015-11-25 DIAGNOSIS — I2510 Atherosclerotic heart disease of native coronary artery without angina pectoris: Secondary | ICD-10-CM | POA: Diagnosis not present

## 2015-11-25 DIAGNOSIS — I10 Essential (primary) hypertension: Secondary | ICD-10-CM | POA: Diagnosis not present

## 2015-11-25 NOTE — Telephone Encounter (Signed)
FYI "This is Angel [name] calling about my mom's Letrozole medication.  Is she supposed to be taking this?  I thought [name] said not to take this.  She gets confused, has been taking it and asked me to call for clarification."    Advised this was discontinued March 2016 and Fulvestrant was stared December 25, 2014 so she should not be taking this because it's duplicate therapy.      "I will bring mom in tomorrow at 11:00 am for her appointment after she see's eye doctor at 9:00.  She had cataract surgery today."  Tomorrow's appointment was changed by patient to a PM appointment.  Instructed to come in at 12:45.  "We'll be there."

## 2015-11-26 ENCOUNTER — Other Ambulatory Visit: Payer: Self-pay

## 2015-11-26 ENCOUNTER — Ambulatory Visit (HOSPITAL_BASED_OUTPATIENT_CLINIC_OR_DEPARTMENT_OTHER): Payer: Medicare Other

## 2015-11-26 ENCOUNTER — Other Ambulatory Visit (HOSPITAL_BASED_OUTPATIENT_CLINIC_OR_DEPARTMENT_OTHER): Payer: Medicare Other

## 2015-11-26 VITALS — BP 104/59 | HR 68 | Temp 98.0°F

## 2015-11-26 DIAGNOSIS — C792 Secondary malignant neoplasm of skin: Secondary | ICD-10-CM

## 2015-11-26 DIAGNOSIS — C50511 Malignant neoplasm of lower-outer quadrant of right female breast: Secondary | ICD-10-CM

## 2015-11-26 DIAGNOSIS — G8929 Other chronic pain: Secondary | ICD-10-CM

## 2015-11-26 DIAGNOSIS — C50911 Malignant neoplasm of unspecified site of right female breast: Secondary | ICD-10-CM

## 2015-11-26 DIAGNOSIS — Z5111 Encounter for antineoplastic chemotherapy: Secondary | ICD-10-CM

## 2015-11-26 DIAGNOSIS — C50919 Malignant neoplasm of unspecified site of unspecified female breast: Secondary | ICD-10-CM | POA: Diagnosis not present

## 2015-11-26 LAB — CBC WITH DIFFERENTIAL/PLATELET
BASO%: 0.6 % (ref 0.0–2.0)
BASOS ABS: 0 10*3/uL (ref 0.0–0.1)
EOS%: 1.7 % (ref 0.0–7.0)
Eosinophils Absolute: 0.1 10*3/uL (ref 0.0–0.5)
HEMATOCRIT: 38.4 % (ref 34.8–46.6)
HGB: 12.7 g/dL (ref 11.6–15.9)
LYMPH%: 39.1 % (ref 14.0–49.7)
MCH: 30.5 pg (ref 25.1–34.0)
MCHC: 33.2 g/dL (ref 31.5–36.0)
MCV: 91.8 fL (ref 79.5–101.0)
MONO#: 0.3 10*3/uL (ref 0.1–0.9)
MONO%: 7.1 % (ref 0.0–14.0)
NEUT#: 2.5 10*3/uL (ref 1.5–6.5)
NEUT%: 51.5 % (ref 38.4–76.8)
Platelets: 184 10*3/uL (ref 145–400)
RBC: 4.18 10*6/uL (ref 3.70–5.45)
RDW: 12.8 % (ref 11.2–14.5)
WBC: 4.8 10*3/uL (ref 3.9–10.3)
lymph#: 1.9 10*3/uL (ref 0.9–3.3)

## 2015-11-26 MED ORDER — OXYCODONE HCL 5 MG PO TABS: 5.0000 mg | ORAL_TABLET | Freq: Four times a day (QID) | ORAL | Status: DC | PRN

## 2015-11-26 MED ORDER — FULVESTRANT 250 MG/5ML IM SOLN
500.0000 mg | Freq: Once | INTRAMUSCULAR | Status: AC
  Administered 2015-11-26: 500 mg via INTRAMUSCULAR
  Filled 2015-11-26: qty 10

## 2015-11-27 ENCOUNTER — Ambulatory Visit: Payer: Medicare Other | Admitting: Cardiology

## 2015-11-27 LAB — CANCER ANTIGEN 27-29 (PARALLEL TESTING): CA 27.29: 12 U/mL (ref <38)

## 2015-11-27 LAB — CANCER ANTIGEN 27.29: CAN 27.29: 11.3 U/mL (ref 0.0–38.6)

## 2015-12-04 ENCOUNTER — Other Ambulatory Visit: Payer: Self-pay | Admitting: Family Medicine

## 2015-12-04 NOTE — Telephone Encounter (Signed)
Last seen 07/31/15 and filled 10/29/15 #45   Please advise    KP

## 2015-12-20 DIAGNOSIS — J45909 Unspecified asthma, uncomplicated: Secondary | ICD-10-CM | POA: Diagnosis not present

## 2015-12-24 ENCOUNTER — Other Ambulatory Visit: Payer: Self-pay | Admitting: *Deleted

## 2015-12-24 ENCOUNTER — Other Ambulatory Visit (HOSPITAL_BASED_OUTPATIENT_CLINIC_OR_DEPARTMENT_OTHER): Payer: Medicare Other

## 2015-12-24 ENCOUNTER — Ambulatory Visit (HOSPITAL_BASED_OUTPATIENT_CLINIC_OR_DEPARTMENT_OTHER): Payer: Medicare Other

## 2015-12-24 DIAGNOSIS — C50911 Malignant neoplasm of unspecified site of right female breast: Secondary | ICD-10-CM | POA: Diagnosis not present

## 2015-12-24 DIAGNOSIS — C50919 Malignant neoplasm of unspecified site of unspecified female breast: Secondary | ICD-10-CM | POA: Diagnosis not present

## 2015-12-24 DIAGNOSIS — Z5111 Encounter for antineoplastic chemotherapy: Secondary | ICD-10-CM | POA: Diagnosis not present

## 2015-12-24 DIAGNOSIS — C50511 Malignant neoplasm of lower-outer quadrant of right female breast: Secondary | ICD-10-CM

## 2015-12-24 DIAGNOSIS — G8929 Other chronic pain: Secondary | ICD-10-CM

## 2015-12-24 LAB — CBC WITH DIFFERENTIAL/PLATELET
BASO%: 0.2 % (ref 0.0–2.0)
BASOS ABS: 0 10*3/uL (ref 0.0–0.1)
EOS ABS: 0.1 10*3/uL (ref 0.0–0.5)
EOS%: 1.4 % (ref 0.0–7.0)
HCT: 40.3 % (ref 34.8–46.6)
HGB: 13.3 g/dL (ref 11.6–15.9)
LYMPH%: 16.5 % (ref 14.0–49.7)
MCH: 30.1 pg (ref 25.1–34.0)
MCHC: 33 g/dL (ref 31.5–36.0)
MCV: 91.2 fL (ref 79.5–101.0)
MONO#: 0.5 10*3/uL (ref 0.1–0.9)
MONO%: 6.4 % (ref 0.0–14.0)
NEUT%: 75.5 % (ref 38.4–76.8)
NEUTROS ABS: 6.2 10*3/uL (ref 1.5–6.5)
PLATELETS: 191 10*3/uL (ref 145–400)
RBC: 4.42 10*6/uL (ref 3.70–5.45)
RDW: 12.9 % (ref 11.2–14.5)
WBC: 8.3 10*3/uL (ref 3.9–10.3)
lymph#: 1.4 10*3/uL (ref 0.9–3.3)

## 2015-12-24 MED ORDER — OXYCODONE HCL 5 MG PO TABS: 5.0000 mg | ORAL_TABLET | Freq: Four times a day (QID) | ORAL | Status: DC | PRN

## 2015-12-24 MED ORDER — FULVESTRANT 250 MG/5ML IM SOLN
500.0000 mg | Freq: Once | INTRAMUSCULAR | Status: DC
  Administered 2015-12-24: 500 mg via INTRAMUSCULAR
  Filled 2015-12-24: qty 10

## 2015-12-24 NOTE — Patient Instructions (Signed)
Fulvestrant injection  What is this medicine?  FULVESTRANT (ful VES trant) blocks the effects of estrogen. It is used to treat breast cancer in women past the age of menopause.  This medicine may be used for other purposes; ask your health care provider or pharmacist if you have questions.  COMMON BRAND NAME(S): FASLODEX  What should I tell my health care provider before I take this medicine?  They need to know if you have any of these conditions:  -bleeding problems  -liver disease  -low levels of platelets in the blood  -an unusual or allergic reaction to fulvestrant, other medicines, foods, dyes, or preservatives  -pregnant or trying to get pregnant  -breast-feeding  How should I use this medicine?  This medicine is for injection into a muscle. It is usually given by a health care professional in a hospital or clinic setting.  Talk to your pediatrician regarding the use of this medicine in children. Special care may be needed.  Overdosage: If you think you have taken too much of this medicine contact a poison control center or emergency room at once.  NOTE: This medicine is only for you. Do not share this medicine with others.  What if I miss a dose?  It is important not to miss your dose. Call your doctor or health care professional if you are unable to keep an appointment.  What may interact with this medicine?  -medicines that treat or prevent blood clots like warfarin, enoxaparin, and dalteparin  This list may not describe all possible interactions. Give your health care provider a list of all the medicines, herbs, non-prescription drugs, or dietary supplements you use. Also tell them if you smoke, drink alcohol, or use illegal drugs. Some items may interact with your medicine.  What should I watch for while using this medicine?  Your condition will be monitored carefully while you are receiving this medicine. You will need important blood work done while you are taking this medicine.  Do not become pregnant  while taking this medicine. Women should inform their doctor if they wish to become pregnant or think they might be pregnant. There is a potential for serious side effects to an unborn child. Talk to your health care professional or pharmacist for more information.  What side effects may I notice from receiving this medicine?  Side effects that you should report to your doctor or health care professional as soon as possible:  -allergic reactions like skin rash, itching or hives, swelling of the face, lips, or tongue  -feeling faint or lightheaded, falls  -fever or flu-like symptoms  -sore throat  -vaginal bleeding  Side effects that usually do not require medical attention (report to your doctor or health care professional if they continue or are bothersome):  -aches, pains  -constipation or diarrhea  -headache  -hot flashes  -nausea, vomiting  -pain at site where injected  -stomach pain  This list may not describe all possible side effects. Call your doctor for medical advice about side effects. You may report side effects to FDA at 1-800-FDA-1088.  Where should I keep my medicine?  This drug is given in a hospital or clinic and will not be stored at home.  NOTE: This sheet is a summary. It may not cover all possible information. If you have questions about this medicine, talk to your doctor, pharmacist, or health care provider.   2015, Elsevier/Gold Standard. (2008-01-22 15:39:24)

## 2015-12-24 NOTE — Telephone Encounter (Signed)
Pt left message on [facility] VM needing a refill of her Oxycodone when she comes in today for her injection. Medication pended and sent to Dr. Jana Hakim and [facility] nurse.  LM on collab nurse VM also.

## 2015-12-25 LAB — CANCER ANTIGEN 27.29: CA 27.29: 16.9 U/mL (ref 0.0–38.6)

## 2015-12-31 DIAGNOSIS — H179 Unspecified corneal scar and opacity: Secondary | ICD-10-CM | POA: Insufficient documentation

## 2016-01-02 ENCOUNTER — Other Ambulatory Visit: Payer: Self-pay | Admitting: Family Medicine

## 2016-01-05 NOTE — Telephone Encounter (Signed)
Last seen 07/31/15 and filled 12/04/15 #45   Please advise    KP

## 2016-01-20 DIAGNOSIS — J45909 Unspecified asthma, uncomplicated: Secondary | ICD-10-CM | POA: Diagnosis not present

## 2016-01-21 ENCOUNTER — Other Ambulatory Visit: Payer: Self-pay | Admitting: *Deleted

## 2016-01-21 ENCOUNTER — Other Ambulatory Visit (HOSPITAL_BASED_OUTPATIENT_CLINIC_OR_DEPARTMENT_OTHER): Payer: Medicare Other

## 2016-01-21 ENCOUNTER — Ambulatory Visit (HOSPITAL_BASED_OUTPATIENT_CLINIC_OR_DEPARTMENT_OTHER): Payer: Medicare Other

## 2016-01-21 VITALS — BP 136/69 | HR 73 | Temp 98.4°F

## 2016-01-21 DIAGNOSIS — C7989 Secondary malignant neoplasm of other specified sites: Secondary | ICD-10-CM

## 2016-01-21 DIAGNOSIS — C50919 Malignant neoplasm of unspecified site of unspecified female breast: Secondary | ICD-10-CM | POA: Diagnosis not present

## 2016-01-21 DIAGNOSIS — Z5111 Encounter for antineoplastic chemotherapy: Secondary | ICD-10-CM | POA: Diagnosis not present

## 2016-01-21 DIAGNOSIS — C50511 Malignant neoplasm of lower-outer quadrant of right female breast: Secondary | ICD-10-CM

## 2016-01-21 DIAGNOSIS — C50911 Malignant neoplasm of unspecified site of right female breast: Secondary | ICD-10-CM | POA: Diagnosis not present

## 2016-01-21 LAB — CBC WITH DIFFERENTIAL/PLATELET
BASO%: 0.4 % (ref 0.0–2.0)
BASOS ABS: 0 10*3/uL (ref 0.0–0.1)
EOS%: 0.7 % (ref 0.0–7.0)
Eosinophils Absolute: 0 10*3/uL (ref 0.0–0.5)
HCT: 39.3 % (ref 34.8–46.6)
HGB: 13.1 g/dL (ref 11.6–15.9)
LYMPH%: 39.3 % (ref 14.0–49.7)
MCH: 30.1 pg (ref 25.1–34.0)
MCHC: 33.3 g/dL (ref 31.5–36.0)
MCV: 90.3 fL (ref 79.5–101.0)
MONO#: 0.5 10*3/uL (ref 0.1–0.9)
MONO%: 9.8 % (ref 0.0–14.0)
NEUT#: 2.7 10*3/uL (ref 1.5–6.5)
NEUT%: 49.8 % (ref 38.4–76.8)
Platelets: 188 10*3/uL (ref 145–400)
RBC: 4.35 10*6/uL (ref 3.70–5.45)
RDW: 12.7 % (ref 11.2–14.5)
WBC: 5.4 10*3/uL (ref 3.9–10.3)
lymph#: 2.1 10*3/uL (ref 0.9–3.3)

## 2016-01-21 MED ORDER — FULVESTRANT 250 MG/5ML IM SOLN
500.0000 mg | Freq: Once | INTRAMUSCULAR | Status: DC
  Administered 2016-01-21: 500 mg via INTRAMUSCULAR
  Filled 2016-01-21: qty 10

## 2016-01-22 LAB — CANCER ANTIGEN 27.29: CAN 27.29: 15.6 U/mL (ref 0.0–38.6)

## 2016-02-02 ENCOUNTER — Other Ambulatory Visit: Payer: Self-pay | Admitting: Family Medicine

## 2016-02-03 NOTE — Telephone Encounter (Signed)
Last seen 07/31/15 and filled 01/05/16 #45   Please advise     KP

## 2016-02-13 ENCOUNTER — Encounter (HOSPITAL_COMMUNITY): Payer: Medicare Other

## 2016-02-17 ENCOUNTER — Encounter (HOSPITAL_COMMUNITY)
Admission: RE | Admit: 2016-02-17 | Discharge: 2016-02-17 | Disposition: A | Discharge status: Home / Self Care | Payer: Medicare Other | Source: Ambulatory Visit | Attending: Nurse Practitioner | Admitting: Nurse Practitioner

## 2016-02-17 ENCOUNTER — Ambulatory Visit (HOSPITAL_COMMUNITY)
Admission: RE | Admit: 2016-02-17 | Discharge: 2016-02-17 | Disposition: A | Discharge status: Home / Self Care | Payer: Medicare Other | Source: Ambulatory Visit | Attending: Nurse Practitioner | Admitting: Nurse Practitioner

## 2016-02-17 ENCOUNTER — Telehealth: Payer: Self-pay

## 2016-02-17 DIAGNOSIS — C50919 Malignant neoplasm of unspecified site of unspecified female breast: Secondary | ICD-10-CM | POA: Diagnosis not present

## 2016-02-17 DIAGNOSIS — Z923 Personal history of irradiation: Secondary | ICD-10-CM | POA: Insufficient documentation

## 2016-02-17 DIAGNOSIS — R937 Abnormal findings on diagnostic imaging of other parts of musculoskeletal system: Secondary | ICD-10-CM | POA: Insufficient documentation

## 2016-02-17 DIAGNOSIS — H179 Unspecified corneal scar and opacity: Secondary | ICD-10-CM | POA: Diagnosis not present

## 2016-02-17 DIAGNOSIS — H35371 Puckering of macula, right eye: Secondary | ICD-10-CM | POA: Diagnosis not present

## 2016-02-17 DIAGNOSIS — Z961 Presence of intraocular lens: Secondary | ICD-10-CM | POA: Diagnosis not present

## 2016-02-17 DIAGNOSIS — Z9841 Cataract extraction status, right eye: Secondary | ICD-10-CM | POA: Diagnosis not present

## 2016-02-17 DIAGNOSIS — C50511 Malignant neoplasm of lower-outer quadrant of right female breast: Secondary | ICD-10-CM | POA: Diagnosis not present

## 2016-02-17 DIAGNOSIS — H2512 Age-related nuclear cataract, left eye: Secondary | ICD-10-CM | POA: Diagnosis not present

## 2016-02-17 MED ORDER — TECHNETIUM TC 99M MEDRONATE IV KIT
18.3000 | PACK | Freq: Once | INTRAVENOUS | Status: AC | PRN
  Administered 2016-02-17: 18.3 via INTRAVENOUS

## 2016-02-17 NOTE — Telephone Encounter (Signed)
Angel [name] daughter will not be with mother on her visit tomorrow. She has some concerns 1) she wants a small portable oxygen tank , she uses O2 at home. Another MD ordered the O2 originally. 2) the oxycodone 5 mg is not helping and she is starting to take 2 at a time. She wants to increase to percocet 10-325. 3) Dr Nelva Bush is taking care of her Norco. The pt takes her norco for breakthrough pain and uses her oxycodone for maintenance. She is aware of not taking too much tylenol. 4) the pt did get lost for about 2 1/2 hours today driving between [facility] and [facility] for her bone scan. The phone call said [place] system and confused her.

## 2016-02-18 ENCOUNTER — Ambulatory Visit (HOSPITAL_BASED_OUTPATIENT_CLINIC_OR_DEPARTMENT_OTHER): Payer: Medicare Other | Admitting: Oncology

## 2016-02-18 ENCOUNTER — Ambulatory Visit (HOSPITAL_BASED_OUTPATIENT_CLINIC_OR_DEPARTMENT_OTHER): Payer: Medicare Other

## 2016-02-18 ENCOUNTER — Other Ambulatory Visit (HOSPITAL_BASED_OUTPATIENT_CLINIC_OR_DEPARTMENT_OTHER): Payer: Medicare Other

## 2016-02-18 ENCOUNTER — Telehealth: Payer: Self-pay | Admitting: Oncology

## 2016-02-18 VITALS — BP 122/58 | HR 73 | Temp 98.1°F | Resp 18 | Ht 62.0 in | Wt 125.7 lb

## 2016-02-18 DIAGNOSIS — M810 Age-related osteoporosis without current pathological fracture: Secondary | ICD-10-CM | POA: Insufficient documentation

## 2016-02-18 DIAGNOSIS — M8580 Other specified disorders of bone density and structure, unspecified site: Secondary | ICD-10-CM | POA: Diagnosis not present

## 2016-02-18 DIAGNOSIS — G8929 Other chronic pain: Secondary | ICD-10-CM

## 2016-02-18 DIAGNOSIS — Z5111 Encounter for antineoplastic chemotherapy: Secondary | ICD-10-CM

## 2016-02-18 DIAGNOSIS — C7989 Secondary malignant neoplasm of other specified sites: Secondary | ICD-10-CM

## 2016-02-18 DIAGNOSIS — C50919 Malignant neoplasm of unspecified site of unspecified female breast: Secondary | ICD-10-CM | POA: Diagnosis not present

## 2016-02-18 DIAGNOSIS — C50911 Malignant neoplasm of unspecified site of right female breast: Secondary | ICD-10-CM | POA: Diagnosis not present

## 2016-02-18 LAB — CBC WITH DIFFERENTIAL/PLATELET
BASO%: 0.7 % (ref 0.0–2.0)
Basophils Absolute: 0 10*3/uL (ref 0.0–0.1)
EOS%: 1.6 % (ref 0.0–7.0)
Eosinophils Absolute: 0.1 10*3/uL (ref 0.0–0.5)
HEMATOCRIT: 39 % (ref 34.8–46.6)
HEMOGLOBIN: 12.7 g/dL (ref 11.6–15.9)
LYMPH#: 1.9 10*3/uL (ref 0.9–3.3)
LYMPH%: 44.2 % (ref 14.0–49.7)
MCH: 29.7 pg (ref 25.1–34.0)
MCHC: 32.5 g/dL (ref 31.5–36.0)
MCV: 91.2 fL (ref 79.5–101.0)
MONO#: 0.4 10*3/uL (ref 0.1–0.9)
MONO%: 8.1 % (ref 0.0–14.0)
NEUT%: 45.4 % (ref 38.4–76.8)
NEUTROS ABS: 2 10*3/uL (ref 1.5–6.5)
PLATELETS: 201 10*3/uL (ref 145–400)
RBC: 4.28 10*6/uL (ref 3.70–5.45)
RDW: 12.9 % (ref 11.2–14.5)
WBC: 4.3 10*3/uL (ref 3.9–10.3)

## 2016-02-18 MED ORDER — FULVESTRANT 250 MG/5ML IM SOLN
500.0000 mg | Freq: Once | INTRAMUSCULAR | Status: AC
  Administered 2016-02-18: 500 mg via INTRAMUSCULAR
  Filled 2016-02-18: qty 10

## 2016-02-18 MED ORDER — DENOSUMAB 60 MG/ML ~~LOC~~ SOLN
60.0000 mg | Freq: Once | SUBCUTANEOUS | Status: AC
  Administered 2016-02-18: 60 mg via SUBCUTANEOUS
  Filled 2016-02-18: qty 1

## 2016-02-18 NOTE — Progress Notes (Signed)
Angel [name]  [phone] [phone]   ID: DYMOND GUTT DOB: 1932/06/12  MR#: [id number]  SVX#:[id number]  Patient Care Team: Angel Held, DO as PCP - General (Family Medicine) Angel Cruel, MD as Consulting Physician (Oncology) Angel Margarita, MD as Consulting Physician (Cardiology) OTHER M.D.: Angel [name] (ophth)  CHIEF COMPLAINT: Chest wall recurrence of breast cancer  CURRENT TREATMENT: Fulvestrant, Denosumab/Prolia  BREAST CANCER HISTORY: Per Dr. 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 She had a second right breast cancer in 2012, with right mastectomy by Dr Angel [name] 07-29-2011 for T1N0M0 ER/PR positive, HER-2 negative invasive ductal carcinoma; she also had what was planned as prophylactic left mastectomy, with unexpected finding of left DCIS in that pathology. Plan in 09-2011 was to begin tamoxifen, chosen particularly due to low bone density. Patient did not keep follow up visits at this office, stopped tamoxifen due intolerance (tho she does not remember what problem she had with this) and was treated with raloxifene by Dr Angel [name].  She was seen next at this office on 12-26-2013 with an enlarging nodular area above right mastectomy scar,reportedly present for several months. Excisional biopsy done 01-21-14 found grade 2 invasive ductal carcinoma with involvement at Angel [name]) and lymphovascular space invasion, ER positive 100%, PR positive 75%, proliferation marker 80% and HER-2 negative by [facility] (pathology [phone]). PET 02-15-14 did not identify any involvement beyond right chest wall. She had 4500 cGy as electron beam radiation from 5-27 thru 03-27-14 to right chest wall. By 04-30-14 there were multiple tiny nodules in region of right mastectomy scar."  The patient's subsequent history is as detailed  below  INTERVAL HISTORY: Angel [name] returns today for follow up of her estrogen receptor positive breast cancer, accompanied by her daughter Angel [name]. [name] receives fulvestrant injections monthly, including today. She tolerates these well. The pain of the shot itself is not a concern to her.  We just obtained a restaging bone scan which shows some uptake in the right rib and right scapular area likely related to prior radiation treatments. The uptake elsewhere is consistent with degenerative disease.  REVIEW OF SYSTEMS: Angel [name] continues to have significant pain daily. This was fairly well-controlled through [name], which she obtained from Dr. Audelia Hives office, but when the "color" changed the medication became less effective. Apparently this is something that has been noted by others and there is some information in the Internet regarding that which they brought today. We tried her on oxycodone but she says that doesn't work at all. Because the pain is not well controlled she is more fatigued than she should be she feels she, she can't do her housework, and it really is affecting her life. Otherwise she has a good diet, she is not constipated from the narcotics and a detailed review of systems today was otherwise noncontributory  PAST MEDICAL HISTORY: Past Medical History  Diagnosis Date  . Asthma   . Spinal stenosis   . Osteoporosis   . Chronic bronchitis   . DDD (degenerative disc disease)   . Osteoarthritis   . Degenerative joint disease   . Coronary artery disease Non-obstructive    a. 2012 Cath: LAD 40-65m>Med Rx;  b. 11/2013 Echo: EF 50-55%, nl wall motion;  c. 12/2013 Lexi MV: EF 74%, no ischemia.  . Depression   . Anxiety   . COPD (chronic obstructive pulmonary disease) ([place]   .  Dyslipidemia   . Cholelithiasis   . GERD (gastroesophageal reflux disease)   . Hx of radiation therapy 07/02/1999- 08/11/1999    right supraclavucular/axillary region: 5040 cGy, 28 fractions  . Hx of  radiation therapy 02/20/14- 03/27/14    right chest wall 4500 cGy 25 sessions  . Hypertension   . Kidney stones     one stones  . Cataract     eye surgery planned for 11/2014  . PVC's (premature ventricular contractions)   . Reflux esophagitis   . Pre-diabetes   . Skin cancer of face     non melanoma  . Breast cancer ([place]) 05/07/1999, 06/2011    a. s/p bilat mastectomies. recurrence 01/2014. Radiation, meds.  . Chest wall recurrence of breast cancer ([place]) 2016  . Cancer of chest (wall) ([place])     PAST SURGICAL HISTORY: Past Surgical History  Procedure Laterality Date  . Vesicovaginal fistula closure w/ tah  age 57  . Tubal ligation  1961  . Appendectomy    . Abdominal hysterectomy      in her 30's  . Small intestine surgery  1998    [facility]  . Tonsillectomy    . Breast lumpectomy  2002    right breast  . Mastectomy  07/29/11    bilateral-rt nodes-none on left  . Cardiac catheterization  03/2011, 3/15  . Breast biopsy Right 01/21/2014    Procedure: BREAST/CHEST WALL BIOPSY;  Surgeon: [name], MD;  Location: [facility];  Service: General;  Laterality: Right;  . Vein ligation and stripping Right   . Left heart catheterization with coronary angiogram N/A 12/05/2013    Procedure: LEFT HEART CATHETERIZATION WITH CORONARY ANGIOGRAM;  Surgeon: [name], MD;  Location: [facility] CATH LAB;  Service: Cardiovascular;  Laterality: N/A;  . Left heart catheterization with coronary angiogram N/A 11/06/2014    Procedure: LEFT HEART CATHETERIZATION WITH CORONARY ANGIOGRAM;  Surgeon: [name], MD;  Location: [facility] CATH LAB;  Service: Cardiovascular;  Laterality: N/A;    FAMILY HISTORY Family History  Problem Relation Age of Onset  . Pancreatic cancer Mother   . Cancer Mother     pancreatic  . Asthma Brother   . Asthma Daughter   . Stroke Daughter   . Hypertension Daughter   . Asthma Grandchild   . Asthma Brother     deceased  . Heart disease Father   . Emphysema Father   .  Heart disease Brother   . Lymphoma Brother    the patient's father died at the age of 30 from heart disease. He was a smoker. The patient's mother died at the age of 75 with cancer of the pancreas. The patient has 3 brothers, no sisters. There is no history of breast or ovarian cancer in the family to her knowledge.  GYNECOLOGIC HISTORY:  No LMP recorded. Patient has had a hysterectomy. Menarche age 25, first live birth age 65. She is GX P4. She stopped having periods in her 20s. She took hormone replacement for approximately 10 years. 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 She has been "single" for 40 years. Her oldest daughter died from a subarachnoid hemorrhage at age 23, 2 years ago. This is when the patient had undergone treatment for invasive breast cancer and she tells me she was simply overwhelmed and could not start the planned antiestrogen therapy. The next daughter, [name], is  a retired Software engineer. She lives in [place]. Next child, Angel [name], works in Charity fundraiser. She also lives in [place]. The youngest, [name], is retired from the Office manager position. He also owned his own business. The patient has 7 grandchildren and 2 many great-grandchildren to count. She attends the [employer]    ADVANCED DIRECTIVES: In place; the patient's daughter Angel [name] is her healthcare power of attorney. She can be reached at [place]: Social History  Substance Use Topics  . Smoking status: Never Smoker   . Smokeless tobacco: Never Used  . Alcohol Use: Yes     Comment: occ wine     Colonoscopy:  PAP:  Bone density: August 2015   Lipid panel:  Allergies  Allergen Reactions  . Penicillins Rash    Has patient had a PCN reaction causing immediate rash, facial/tongue/throat swelling, SOB or lightheadedness with hypotension: Yes Has patient  had a PCN reaction causing severe rash involving mucus membranes or skin necrosis: No Has patient had a PCN reaction that required hospitalization No Has patient had a PCN reaction occurring within the last 10 years: No If all of the above answers are "NO", then may proceed with Cephalosporin use.   . Adhesive [Tape] Other (See Comments)    Pt prefers to use paper tape    Current Outpatient Prescriptions  Medication Sig Dispense Refill  . acetaminophen (TYLENOL) 650 MG CR tablet Take 650 mg by mouth every 8 (eight) hours as needed for pain. Reported on 10/29/2015    . Artificial Tear Ointment (DRY EYES OP) Apply 2 drops to eye daily as needed (dry eyes).    [name] aspirin EC 81 MG tablet Take 2 tablets (162 mg total) by mouth daily.    . B Complex-Biotin-FA (B-COMPLEX PO) Take 1 tablet by mouth daily.     . Biotin 5000 MCG CAPS Take 5,000 mcg by mouth daily.    . budesonide-formoterol (SYMBICORT) 160-4.5 MCG/ACT inhaler Inhale 2 puffs into the lungs 2 (two) times daily as needed. (Patient taking differently: Inhale 2 puffs into the lungs 2 (two) times daily as needed (shortness of breath). ) 3 Inhaler 1  . Calcium-Magnesium-Zinc 1000-400-15 MG TABS Take 1 tablet by mouth daily.      . celecoxib (CELEBREX) 200 MG capsule Take 1 capsule daily with food for pain & inflammation 90 capsule 3  . Cholecalciferol (VITAMIN D-3) 5000 UNITS TABS Take 1 tablet by mouth daily.     . citalopram (CELEXA) 40 MG tablet Take 1 tablet (40 mg total) by mouth daily.    . diazepam (VALIUM) 5 MG tablet TAKE ONE-HALF TO ONE TABLET BY MOUTH EVERY 8 HOURS AS NEEDED FOR ANXIETY 45 tablet 0  . DULoxetine (CYMBALTA) 60 MG capsule Take 1 capsule by mouth  every day for mood 90 capsule 1  . ezetimibe (ZETIA) 10 MG tablet Take 1 tablet (10 mg total) by mouth daily.    . Fish Oil OIL Take 1 capsule by mouth daily.     . Flaxseed, Linseed, (GROUND FLAX SEEDS PO) Take 5 mLs by mouth daily.     [name] HYDROcodone-acetaminophen (NORCO)  10-325 MG tablet Take 2 tablets by mouth every 4 (four) hours as needed for moderate pain.     . isosorbide mononitrate (IMDUR) 30 MG 24 hr tablet Take 1 tablet (30 mg total) by mouth daily. 90 tablet 3  . montelukast (SINGULAIR) 10 MG tablet Take 1 tablet (10 mg total) by mouth at bedtime. 90 tablet  3  . Multiple Vitamin (MULTIVITAMIN) tablet Take 1 tablet by mouth daily.    . ondansetron (ZOFRAN ODT) 4 MG disintegrating tablet Take 1 tablet (4 mg total) by mouth every 8 (eight) hours as needed for nausea or vomiting. 20 tablet 0  . oxyCODONE (OXY IR/ROXICODONE) 5 MG immediate release tablet Take 1 tablet (5 mg total) by mouth every 6 (six) hours as needed for severe pain. 120 tablet 0   No current facility-administered medications for this visit.    OBJECTIVE: Elderly white woman Who appears stated age 82 Vitals:   02/18/16 1125  BP: 122/58  Pulse: 73  Temp: 98.1 F (36.7 C)  Resp: 18     Body mass index is 22.98 kg/(m^2).    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 No evidence of further chest wall recurrence. Both axillae are benign.    LAB RESULTS:  CMP     Component Value Date/Time   NA 136 10/29/2015 1127   NA 137 07/23/2015 1407   K 4.6 10/29/2015 1127   K 4.1 07/23/2015 1407   CL 104 07/23/2015 1407   CO2 27 10/29/2015 1127   CO2 27 07/23/2015 1407   GLUCOSE 96 10/29/2015 1127   GLUCOSE 93 07/23/2015 1407   BUN 16.3 10/29/2015 1127   BUN 12 07/23/2015 1407   CREATININE 0.8 10/29/2015 1127   CREATININE 0.63 07/23/2015 1407    CREATININE 0.78 05/01/2014 1050   CALCIUM 9.5 10/29/2015 1127   CALCIUM 8.7* 07/23/2015 1407   PROT 7.3 10/29/2015 1127   PROT 6.1 10/03/2015 1102   ALBUMIN 4.1 10/29/2015 1127   ALBUMIN 3.7 10/03/2015 1102   AST 18 10/29/2015 1127   AST 17 10/03/2015 1102   ALT 12 10/29/2015 1127   ALT 9 10/03/2015 1102   ALKPHOS 40 10/29/2015 1127   ALKPHOS 34 10/03/2015 1102   BILITOT 0.51 10/29/2015 1127   BILITOT 0.4 10/03/2015 1102   GFRNONAA >60 07/23/2015 1407   GFRNONAA 73 05/01/2014 1050   GFRAA >60 07/23/2015 1407   GFRAA 84 05/01/2014 1050    I No results found for: SPEP  Lab Results  Component Value Date   WBC 4.3 02/18/2016   NEUTROABS 2.0 02/18/2016   HGB 12.7 02/18/2016   HCT 39.0 02/18/2016   MCV 91.2 02/18/2016   PLT 201 02/18/2016      Chemistry      Component Value Date/Time   NA 136 10/29/2015 1127   NA 137 07/23/2015 1407   K 4.6 10/29/2015 1127   K 4.1 07/23/2015 1407   CL 104 07/23/2015 1407   CO2 27 10/29/2015 1127   CO2 27 07/23/2015 1407   BUN 16.3 10/29/2015 1127   BUN 12 07/23/2015 1407   CREATININE 0.8 10/29/2015 1127   CREATININE 0.63 07/23/2015 1407   CREATININE 0.78 05/01/2014 1050      Component Value Date/Time   CALCIUM 9.5 10/29/2015 1127   CALCIUM 8.7* 07/23/2015 1407   ALKPHOS 40 10/29/2015 1127   ALKPHOS 34 10/03/2015 1102   AST 18 10/29/2015 1127   AST 17 10/03/2015 1102   ALT 12 10/29/2015 1127  ALT 9 10/03/2015 1102   BILITOT 0.51 10/29/2015 1127   BILITOT 0.4 10/03/2015 1102       Lab Results  Component Value Date   LABCA2 12 11/26/2015    No components found for: [id number]  No results for input(s): INR in the last 168 hours.  Urinalysis    Component Value Date/Time   COLORURINE YELLOW 02/14/2015 2010   APPEARANCEUR CLOUDY* 02/14/2015 2010   LABSPEC 1.008 02/14/2015 2010   PHURINE 6.5 02/14/2015 2010   GLUCOSEU NEGATIVE 02/14/2015 2010   HGBUR MODERATE* 02/14/2015 2010   BILIRUBINUR neg 04/25/2015 1015    [place] 02/14/2015 2010   [place] NEGATIVE 02/14/2015 2010   PROTEINUR neg 04/25/2015 1015   PROTEINUR NEGATIVE 02/14/2015 2010   UROBILINOGEN 0.2 04/25/2015 1015   UROBILINOGEN 0.2 02/14/2015 2010   NITRITE neg 04/25/2015 1015   NITRITE NEGATIVE 02/14/2015 2010   LEUKOCYTESUR Negative 04/25/2015 1015    STUDIES: Nm Bone Scan Whole Body  02/17/2016  CLINICAL DATA:  Recurrent breast malignancy EXAM: NUCLEAR MEDICINE WHOLE BODY BONE SCAN TECHNIQUE: Whole body anterior and posterior images were obtained approximately 3 hours after intravenous injection of radiopharmaceutical. RADIOPHARMACEUTICALS:  27.0 millicuries mCi [id number] MDP IV COMPARISON:  Nuclear bone scan dated July 24, 2015 FINDINGS: There is adequate uptake of the radiopharmaceutical by the skeleton. There is adequate soft tissue clearance and renal activity. Again demonstrated is moderately increased uptake in the anterior aspects of the right third through eighth ribs. There is persistent increased uptake in the lateral aspect of the right sternal body. There is stable mildly increased uptake at T1 posteriorly. Mildly increased uptake in the lower lumbar spine is stable and compatible with degenerative change. Uptake within the pelvis is normal. Uptake within the calvarium, upper extremities, and lower extremities is not suspicious for metastatic disease. There is increased uptake in the lateral aspect of the right knee compatible with degenerative change. IMPRESSION: 1. No areas of new abnormal uptake within the skeleton to suggest progressive metastatic disease. 2. Right rib uptake and right scapular uptake little changed from the previous study possibly related to radiation necrosis given the pattern of distribution. 3. Uptake elsewhere as described most compatible with degenerative change. Electronically Signed   By: David  Martinique M.D.   On: 02/17/2016 14:48     ASSESSMENT: 82 y.o. [place] woman with locally  recurrent breast cancer, as follows:  (1) status post right lumpectomy and sentinel node sampling September 2000 for ductal carcinoma in situ (Ni+), estrogen and progesterone receptor positive, with a low proliferation index,  (a) received 5040 cGy to the right breast and regional lymph nodes  (b) on tamoxifen less than 3 months  (2) s/p bilateral mastectomies and right axillary lymph node sampling 07/29/2011, showing  (a) in the left breast, ductal carcinoma in situ measuring 1.2 cm, grade 1, with negative margins, estrogen and progesterone receptor positive  (b)  on the right, a pT1c pN0, stage IA invasive ductal carcinoma, estrogen receptor and 93% positive, progesterone receptor 87% positive, with an MIB-1 of 35%, and no HER-2 amplification margins were close but negative  (c) did not receive adjuvant antiestrogen therapy  (3) right chest wall skin recurrence resected 01/21/2014, measuring 1.7 cm, with positive margins, estrogen receptor 100% positive, progesterone receptor 75% positive, with an MIB-1 of 80%, and no HER-2 amplification; PET scan 02/15/2014 negative except for Right chest wall focus  (a) received 45 Gy electron-beam therapy to the right chest wall completed 03/27/2014  (b) skin progression within  and without the radiation field noted 04/30/2014  (c) letrozole started 05/13/2014, discontinued March 2016 with progression   (4) de[facility]09[facility]2014 [facility] shows osteoporosis with a T score of -2.9  (5) Fulvestrant started 12/25/2014  (6) osteopenia, on DEXA scan 05/09/2014  (a) started Denosumab/prolia 08/06/2015  (7) Chronic pain: Followed by Dr. Nelva Bush  PLAN: Alyana is doing remarkably well as far as her breast cancer is concerned, with no evidence of disease activity at present. We reviewed her labs and her bone scan, both of which are very favorable.  Accordingly as far as the breast cancer goes the plan is to continue the fulvestrant indefinitely.  She  also receives the nostril mass/probably every 6 months and is due for a dose today. She will be due for repeat bone density later this year and I have scheduled that for her in September.  She continues to struggle with the chronic pain, which is due to her ostium arthritis. This is managed by Dr. Dossie Der but she has not been able to get the benefit that she used to from the medication has prescribed. We gave her a few oxycodone to see if that made a difference but it did not. I did not refill that for her today.  Accordingly as far as the pain issue is concerned she will discuss that with Dr. Dossie Der tomorrow. She was also interested in pursuing perhaps a pain clinic referral and we will be glad to facilitate that for her. She understands there is usually a long waiting period before patient can get a slot  She will see me again in 6 months. She knows to call for any problems that may develop before her next visit here.   Angel Cruel, MD   02/18/2016 11:35 AM

## 2016-02-18 NOTE — Telephone Encounter (Signed)
appt made and avs printed °

## 2016-02-18 NOTE — Patient Instructions (Signed)
Fulvestrant injection What is this medicine? FULVESTRANT (ful VES trant) blocks the effects of estrogen. It is used to treat breast cancer in women past the age of menopause. This medicine may be used for other purposes; ask your health care provider or pharmacist if you have questions. COMMON BRAND NAME(S): FASLODEX What should I tell my health care provider before I take this medicine? They need to know if you have any of these conditions: -bleeding problems -liver disease -low levels of platelets in the blood -an unusual or allergic reaction to fulvestrant, other medicines, foods, dyes, or preservatives -pregnant or trying to get pregnant -breast-feeding How should I use this medicine? This medicine is for injection into a muscle. It is usually given by a health care professional in a hospital or clinic setting. Talk to your pediatrician regarding the use of this medicine in children. Special care may be needed. Overdosage: If you think you have taken too much of this medicine contact a poison control center or emergency room at once. NOTE: This medicine is only for you. Do not share this medicine with others. What if I miss a dose? It is important not to miss your dose. Call your doctor or health care professional if you are unable to keep an appointment. What may interact with this medicine? -medicines that treat or prevent blood clots like warfarin, enoxaparin, and dalteparin This list may not describe all possible interactions. Give your health care provider a list of all the medicines, herbs, non-prescription drugs, or dietary supplements you use. Also tell them if you smoke, drink alcohol, or use illegal drugs. Some items may interact with your medicine. What should I watch for while using this medicine? Your condition will be monitored carefully while you are receiving this medicine. You will need important blood work done while you are taking this medicine. Do not become pregnant  while taking this medicine. Women should inform their doctor if they wish to become pregnant or think they might be pregnant. There is a potential for serious side effects to an unborn child. Talk to your health care professional or pharmacist for more information. What side effects may I notice from receiving this medicine? Side effects that you should report to your doctor or health care professional as soon as possible: -allergic reactions like skin rash, itching or hives, swelling of the face, lips, or tongue -feeling faint or lightheaded, falls -fever or flu-like symptoms -sore throat -vaginal bleeding Side effects that usually do not require medical attention (report to your doctor or health care professional if they continue or are bothersome): -aches, pains -constipation or diarrhea -headache -hot flashes -nausea, vomiting -pain at site where injected -stomach pain This list may not describe all possible side effects. Call your doctor for medical advice about side effects. You may report side effects to FDA at 1-800-FDA-1088. Where should I keep my medicine? This drug is given in a hospital or clinic and will not be stored at home. NOTE: This sheet is a summary. It may not cover all possible information. If you have questions about this medicine, talk to your doctor, pharmacist, or health care provider.  2015, Elsevier/Gold Standard. (2008-01-22 15:39:24) Denosumab injection What is this medicine? DENOSUMAB (den oh sue mab) slows bone breakdown. Prolia is used to treat osteoporosis in women after menopause and in men. Delton See is used to prevent bone fractures and other bone problems caused by cancer bone metastases. Delton See is also used to treat giant cell tumor of the bone. This  medicine may be used for other purposes; ask your health care provider or pharmacist if you have questions. What should I tell my health care provider before I take this medicine? They need to know if you have  any of these conditions: -dental disease -eczema -infection or history of infections -kidney disease or on dialysis -low blood calcium or vitamin D -malabsorption syndrome -scheduled to have surgery or tooth extraction -taking medicine that contains denosumab -thyroid or parathyroid disease -an unusual reaction to denosumab, other medicines, foods, dyes, or preservatives -pregnant or trying to get pregnant -breast-feeding How should I use this medicine? This medicine is for injection under the skin. It is given by a health care professional in a hospital or clinic setting. If you are getting Prolia, a special MedGuide will be given to you by the pharmacist with each prescription and refill. Be sure to read this information carefully each time. For Prolia, talk to your pediatrician regarding the use of this medicine in children. Special care may be needed. For Delton See, talk to your pediatrician regarding the use of this medicine in children. While this drug may be prescribed for children as young as 13 years for selected conditions, precautions do apply. Overdosage: If you think you have taken too much of this medicine contact a poison control center or emergency room at once. NOTE: This medicine is only for you. Do not share this medicine with others. What if I miss a dose? It is important not to miss your dose. Call your doctor or health care professional if you are unable to keep an appointment. What may interact with this medicine? Do not take this medicine with any of the following medications: -other medicines containing denosumab This medicine may also interact with the following medications: -medicines that suppress the immune system -medicines that treat cancer -steroid medicines like prednisone or cortisone This list may not describe all possible interactions. Give your health care provider a list of all the medicines, herbs, non-prescription drugs, or dietary supplements you use.  Also tell them if you smoke, drink alcohol, or use illegal drugs. Some items may interact with your medicine. What should I watch for while using this medicine? Visit your doctor or health care professional for regular checks on your progress. Your doctor or health care professional may order blood tests and other tests to see how you are doing. Call your doctor or health care professional if you get a cold or other infection while receiving this medicine. Do not treat yourself. This medicine may decrease your body's ability to fight infection. You should make sure you get enough calcium and vitamin D while you are taking this medicine, unless your doctor tells you not to. Discuss the foods you eat and the vitamins you take with your health care professional. See your dentist regularly. Brush and floss your teeth as directed. Before you have any dental work done, tell your dentist you are receiving this medicine. Do not become pregnant while taking this medicine or for 5 months after stopping it. Women should inform their doctor if they wish to become pregnant or think they might be pregnant. There is a potential for serious side effects to an unborn child. Talk to your health care professional or pharmacist for more information. What side effects may I notice from receiving this medicine? Side effects that you should report to your doctor or health care professional as soon as possible: -allergic reactions like skin rash, itching or hives, swelling of the face, lips,  or tongue -breathing problems -chest pain -fast, irregular heartbeat -feeling faint or lightheaded, falls -fever, chills, or any other sign of infection -muscle spasms, tightening, or twitches -numbness or tingling -skin blisters or bumps, or is dry, peels, or red -slow healing or unexplained pain in the mouth or jaw -unusual bleeding or bruising Side effects that usually do not require medical attention (Report these to your doctor  or health care professional if they continue or are bothersome.): -muscle pain -stomach upset, gas This list may not describe all possible side effects. Call your doctor for medical advice about side effects. You may report side effects to FDA at 1-800-FDA-1088. Where should I keep my medicine? This medicine is only given in a clinic, doctor's office, or other health care setting and will not be stored at home. NOTE: This sheet is a summary. It may not cover all possible information. If you have questions about this medicine, talk to your doctor, pharmacist, or health care provider.    2016, Elsevier/Gold Standard. (2012-03-13 12:37:47)

## 2016-02-18 NOTE — Progress Notes (Signed)
Per Dr. Jana Hakim, writer contacted the pain management intake coordinator- Thurmond Butts.  Writer filled out the intake form and [facility] for Dr. Hardin Negus.  They will contact her with an appt.

## 2016-02-19 ENCOUNTER — Other Ambulatory Visit: Payer: Self-pay | Admitting: Nurse Practitioner

## 2016-02-19 DIAGNOSIS — J45909 Unspecified asthma, uncomplicated: Secondary | ICD-10-CM | POA: Diagnosis not present

## 2016-02-19 LAB — CANCER ANTIGEN 27.29: CA 27.29: 16 U/mL (ref 0.0–38.6)

## 2016-02-20 ENCOUNTER — Other Ambulatory Visit: Payer: Self-pay | Admitting: *Deleted

## 2016-02-25 ENCOUNTER — Other Ambulatory Visit (INDEPENDENT_AMBULATORY_CARE_PROVIDER_SITE_OTHER): Payer: Medicare Other | Admitting: *Deleted

## 2016-02-25 DIAGNOSIS — E785 Hyperlipidemia, unspecified: Secondary | ICD-10-CM

## 2016-02-25 NOTE — Addendum Note (Signed)
Addended by: Eulis Foster on: 02/25/2016 10:03 AM   Modules accepted: Orders

## 2016-02-25 NOTE — Addendum Note (Signed)
Addended by: Eulis Foster on: 02/25/2016 10:04 AM   Modules accepted: Orders

## 2016-03-02 DIAGNOSIS — M5033 Other cervical disc degeneration, cervicothoracic region: Secondary | ICD-10-CM | POA: Diagnosis not present

## 2016-03-02 DIAGNOSIS — M5136 Other intervertebral disc degeneration, lumbar region: Secondary | ICD-10-CM | POA: Diagnosis not present

## 2016-03-08 ENCOUNTER — Other Ambulatory Visit: Payer: Self-pay

## 2016-03-08 DIAGNOSIS — F32A Depression, unspecified: Secondary | ICD-10-CM

## 2016-03-08 DIAGNOSIS — F329 Major depressive disorder, single episode, unspecified: Secondary | ICD-10-CM

## 2016-03-08 MED ORDER — CITALOPRAM HYDROBROMIDE 40 MG PO TABS: 40.0000 mg | ORAL_TABLET | Freq: Every day | ORAL | Status: DC

## 2016-03-10 ENCOUNTER — Telehealth: Payer: Self-pay | Admitting: Oncology

## 2016-03-10 ENCOUNTER — Other Ambulatory Visit (HOSPITAL_BASED_OUTPATIENT_CLINIC_OR_DEPARTMENT_OTHER): Payer: Medicare Other

## 2016-03-10 ENCOUNTER — Ambulatory Visit: Payer: Medicare Other

## 2016-03-10 ENCOUNTER — Other Ambulatory Visit: Payer: Self-pay | Admitting: *Deleted

## 2016-03-10 VITALS — BP 123/64 | HR 72 | Temp 97.5°F

## 2016-03-10 DIAGNOSIS — M810 Age-related osteoporosis without current pathological fracture: Secondary | ICD-10-CM

## 2016-03-10 DIAGNOSIS — C50911 Malignant neoplasm of unspecified site of right female breast: Secondary | ICD-10-CM | POA: Diagnosis not present

## 2016-03-10 DIAGNOSIS — C50919 Malignant neoplasm of unspecified site of unspecified female breast: Secondary | ICD-10-CM | POA: Diagnosis not present

## 2016-03-10 LAB — CBC WITH DIFFERENTIAL/PLATELET
BASO%: 0.5 % (ref 0.0–2.0)
Basophils Absolute: 0 10e3/uL (ref 0.0–0.1)
EOS%: 1.2 % (ref 0.0–7.0)
Eosinophils Absolute: 0.1 10e3/uL (ref 0.0–0.5)
HCT: 39.7 % (ref 34.8–46.6)
HGB: 13 g/dL (ref 11.6–15.9)
LYMPH%: 37.3 % (ref 14.0–49.7)
MCH: 30.1 pg (ref 25.1–34.0)
MCHC: 32.7 g/dL (ref 31.5–36.0)
MCV: 91.9 fL (ref 79.5–101.0)
MONO#: 0.6 10e3/uL (ref 0.1–0.9)
MONO%: 9.6 % (ref 0.0–14.0)
NEUT#: 3.1 10e3/uL (ref 1.5–6.5)
NEUT%: 51.4 % (ref 38.4–76.8)
Platelets: 238 10e3/uL (ref 145–400)
RBC: 4.32 10e6/uL (ref 3.70–5.45)
RDW: 13.2 % (ref 11.2–14.5)
WBC: 6 10e3/uL (ref 3.9–10.3)
lymph#: 2.3 10e3/uL (ref 0.9–3.3)

## 2016-03-10 MED ORDER — FULVESTRANT 250 MG/5ML IM SOLN: 500.0000 mg | Freq: Once | INTRAMUSCULAR | Status: DC

## 2016-03-10 NOTE — Patient Instructions (Signed)
Fulvestrant injection What is this medicine? FULVESTRANT (ful VES trant) blocks the effects of estrogen. It is used to treat breast cancer in women past the age of menopause. This medicine may be used for other purposes; ask your health care provider or pharmacist if you have questions. COMMON BRAND NAME(S): FASLODEX What should I tell my health care provider before I take this medicine? They need to know if you have any of these conditions: -bleeding problems -liver disease -low levels of platelets in the blood -an unusual or allergic reaction to fulvestrant, other medicines, foods, dyes, or preservatives -pregnant or trying to get pregnant -breast-feeding How should I use this medicine? This medicine is for injection into a muscle. It is usually given by a health care professional in a hospital or clinic setting. Talk to your pediatrician regarding the use of this medicine in children. Special care may be needed. Overdosage: If you think you have taken too much of this medicine contact a poison control center or emergency room at once. NOTE: This medicine is only for you. Do not share this medicine with others. What if I miss a dose? It is important not to miss your dose. Call your doctor or health care professional if you are unable to keep an appointment. What may interact with this medicine? -medicines that treat or prevent blood clots like warfarin, enoxaparin, and dalteparin This list may not describe all possible interactions. Give your health care provider a list of all the medicines, herbs, non-prescription drugs, or dietary supplements you use. Also tell them if you smoke, drink alcohol, or use illegal drugs. Some items may interact with your medicine. What should I watch for while using this medicine? Your condition will be monitored carefully while you are receiving this medicine. You will need important blood work done while you are taking this medicine. Do not become pregnant  while taking this medicine. Women should inform their doctor if they wish to become pregnant or think they might be pregnant. There is a potential for serious side effects to an unborn child. Talk to your health care professional or pharmacist for more information. What side effects may I notice from receiving this medicine? Side effects that you should report to your doctor or health care professional as soon as possible: -allergic reactions like skin rash, itching or hives, swelling of the face, lips, or tongue -feeling faint or lightheaded, falls -fever or flu-like symptoms -sore throat -vaginal bleeding Side effects that usually do not require medical attention (report to your doctor or health care professional if they continue or are bothersome): -aches, pains -constipation or diarrhea -headache -hot flashes -nausea, vomiting -pain at site where injected -stomach pain This list may not describe all possible side effects. Call your doctor for medical advice about side effects. You may report side effects to FDA at 1-800-FDA-1088. Where should I keep my medicine? This drug is given in a hospital or clinic and will not be stored at home. NOTE: This sheet is a summary. It may not cover all possible information. If you have questions about this medicine, talk to your doctor, pharmacist, or health care provider.  2015, Elsevier/Gold Standard. (2008-01-22 15:39:24) Denosumab injection What is this medicine? DENOSUMAB (den oh sue mab) slows bone breakdown. Prolia is used to treat osteoporosis in women after menopause and in men. Delton See is used to prevent bone fractures and other bone problems caused by cancer bone metastases. Delton See is also used to treat giant cell tumor of the bone. This  medicine may be used for other purposes; ask your health care provider or pharmacist if you have questions. What should I tell my health care provider before I take this medicine? They need to know if you have  any of these conditions: -dental disease -eczema -infection or history of infections -kidney disease or on dialysis -low blood calcium or vitamin D -malabsorption syndrome -scheduled to have surgery or tooth extraction -taking medicine that contains denosumab -thyroid or parathyroid disease -an unusual reaction to denosumab, other medicines, foods, dyes, or preservatives -pregnant or trying to get pregnant -breast-feeding How should I use this medicine? This medicine is for injection under the skin. It is given by a health care professional in a hospital or clinic setting. If you are getting Prolia, a special MedGuide will be given to you by the pharmacist with each prescription and refill. Be sure to read this information carefully each time. For Prolia, talk to your pediatrician regarding the use of this medicine in children. Special care may be needed. For Delton See, talk to your pediatrician regarding the use of this medicine in children. While this drug may be prescribed for children as young as 13 years for selected conditions, precautions do apply. Overdosage: If you think you have taken too much of this medicine contact a poison control center or emergency room at once. NOTE: This medicine is only for you. Do not share this medicine with others. What if I miss a dose? It is important not to miss your dose. Call your doctor or health care professional if you are unable to keep an appointment. What may interact with this medicine? Do not take this medicine with any of the following medications: -other medicines containing denosumab This medicine may also interact with the following medications: -medicines that suppress the immune system -medicines that treat cancer -steroid medicines like prednisone or cortisone This list may not describe all possible interactions. Give your health care provider a list of all the medicines, herbs, non-prescription drugs, or dietary supplements you use.  Also tell them if you smoke, drink alcohol, or use illegal drugs. Some items may interact with your medicine. What should I watch for while using this medicine? Visit your doctor or health care professional for regular checks on your progress. Your doctor or health care professional may order blood tests and other tests to see how you are doing. Call your doctor or health care professional if you get a cold or other infection while receiving this medicine. Do not treat yourself. This medicine may decrease your body's ability to fight infection. You should make sure you get enough calcium and vitamin D while you are taking this medicine, unless your doctor tells you not to. Discuss the foods you eat and the vitamins you take with your health care professional. See your dentist regularly. Brush and floss your teeth as directed. Before you have any dental work done, tell your dentist you are receiving this medicine. Do not become pregnant while taking this medicine or for 5 months after stopping it. Women should inform their doctor if they wish to become pregnant or think they might be pregnant. There is a potential for serious side effects to an unborn child. Talk to your health care professional or pharmacist for more information. What side effects may I notice from receiving this medicine? Side effects that you should report to your doctor or health care professional as soon as possible: -allergic reactions like skin rash, itching or hives, swelling of the face, lips,  or tongue -breathing problems -chest pain -fast, irregular heartbeat -feeling faint or lightheaded, falls -fever, chills, or any other sign of infection -muscle spasms, tightening, or twitches -numbness or tingling -skin blisters or bumps, or is dry, peels, or red -slow healing or unexplained pain in the mouth or jaw -unusual bleeding or bruising Side effects that usually do not require medical attention (Report these to your doctor  or health care professional if they continue or are bothersome.): -muscle pain -stomach upset, gas This list may not describe all possible side effects. Call your doctor for medical advice about side effects. You may report side effects to FDA at 1-800-FDA-1088. Where should I keep my medicine? This medicine is only given in a clinic, doctor's office, or other health care setting and will not be stored at home. NOTE: This sheet is a summary. It may not cover all possible information. If you have questions about this medicine, talk to your doctor, pharmacist, or health care provider.    2016, Elsevier/Gold Standard. (2012-03-13 12:37:47)

## 2016-03-10 NOTE — Progress Notes (Signed)
Injection not given, pt was scheduled at incorrect time. Pt is to return in one week for injection per orders.

## 2016-03-10 NOTE — Telephone Encounter (Signed)
Pt inn apt were scheduled 1 week to early.. Moved all apt down a week & gave pt new sched

## 2016-03-11 ENCOUNTER — Other Ambulatory Visit: Payer: Self-pay | Admitting: Family Medicine

## 2016-03-11 LAB — CANCER ANTIGEN 27.29: CAN 27.29: 17.2 U/mL (ref 0.0–38.6)

## 2016-03-12 NOTE — Telephone Encounter (Signed)
Last seen 07/31/15 and filled 02/03/16 #45   Please advise    KP

## 2016-03-17 ENCOUNTER — Ambulatory Visit (HOSPITAL_BASED_OUTPATIENT_CLINIC_OR_DEPARTMENT_OTHER): Payer: Medicare Other

## 2016-03-17 ENCOUNTER — Telehealth: Payer: Self-pay | Admitting: *Deleted

## 2016-03-17 ENCOUNTER — Other Ambulatory Visit (HOSPITAL_BASED_OUTPATIENT_CLINIC_OR_DEPARTMENT_OTHER): Payer: Medicare Other

## 2016-03-17 VITALS — BP 134/72 | HR 63 | Temp 98.2°F

## 2016-03-17 DIAGNOSIS — C50911 Malignant neoplasm of unspecified site of right female breast: Secondary | ICD-10-CM

## 2016-03-17 DIAGNOSIS — Z5111 Encounter for antineoplastic chemotherapy: Secondary | ICD-10-CM | POA: Diagnosis not present

## 2016-03-17 DIAGNOSIS — M810 Age-related osteoporosis without current pathological fracture: Secondary | ICD-10-CM

## 2016-03-17 LAB — CBC WITH DIFFERENTIAL/PLATELET
BASO%: 0.5 % (ref 0.0–2.0)
Basophils Absolute: 0 10*3/uL (ref 0.0–0.1)
EOS%: 0.9 % (ref 0.0–7.0)
Eosinophils Absolute: 0.1 10*3/uL (ref 0.0–0.5)
HEMATOCRIT: 38.1 % (ref 34.8–46.6)
HEMOGLOBIN: 12.5 g/dL (ref 11.6–15.9)
LYMPH#: 1.7 10*3/uL (ref 0.9–3.3)
LYMPH%: 23.3 % (ref 14.0–49.7)
MCH: 29.9 pg (ref 25.1–34.0)
MCHC: 32.8 g/dL (ref 31.5–36.0)
MCV: 91.1 fL (ref 79.5–101.0)
MONO#: 0.4 10*3/uL (ref 0.1–0.9)
MONO%: 5.5 % (ref 0.0–14.0)
NEUT%: 69.8 % (ref 38.4–76.8)
NEUTROS ABS: 5.1 10*3/uL (ref 1.5–6.5)
Platelets: 200 10*3/uL (ref 145–400)
RBC: 4.18 10*6/uL (ref 3.70–5.45)
RDW: 13 % (ref 11.2–14.5)
WBC: 7.3 10*3/uL (ref 3.9–10.3)

## 2016-03-17 MED ORDER — FULVESTRANT 250 MG/5ML IM SOLN
500.0000 mg | Freq: Once | INTRAMUSCULAR | Status: AC
  Administered 2016-03-17: 500 mg via INTRAMUSCULAR
  Filled 2016-03-17: qty 10

## 2016-03-17 MED ORDER — ONDANSETRON 4 MG PO TBDP: 4.0000 mg | ORAL_TABLET | Freq: Three times a day (TID) | ORAL | Status: DC | PRN

## 2016-03-17 NOTE — Telephone Encounter (Signed)
Received walk-in request for nausea med.  Pt out of zofran.  Getting lab & injection today.  Reviewed chart & pt has not had zofran ODT filled since 2016.  Verified with Dr MAgrinat's RN OK to refill.  Script sent electronically to [facility].

## 2016-03-17 NOTE — Patient Instructions (Signed)

## 2016-03-19 ENCOUNTER — Telehealth: Payer: Self-pay | Admitting: Family Medicine

## 2016-03-19 DIAGNOSIS — F329 Major depressive disorder, single episode, unspecified: Secondary | ICD-10-CM

## 2016-03-19 DIAGNOSIS — F32A Depression, unspecified: Secondary | ICD-10-CM

## 2016-03-19 MED ORDER — DULOXETINE HCL 60 MG PO CPEP: ORAL_CAPSULE | ORAL | Status: DC

## 2016-03-19 NOTE — Telephone Encounter (Signed)
Rx faxed.    KP 

## 2016-03-19 NOTE — Telephone Encounter (Signed)
Caller name: Relationship to patient: Self Can be reached: [phone]   Pharmacy:  [facility] [place], [place]. [phone] (Phone) [phone] (Fax)        Request refill on DULoxetine (CYMBALTA) 60 MG capsule [id number]

## 2016-03-21 DIAGNOSIS — J45909 Unspecified asthma, uncomplicated: Secondary | ICD-10-CM | POA: Diagnosis not present

## 2016-03-23 ENCOUNTER — Telehealth: Payer: Self-pay | Admitting: *Deleted

## 2016-03-23 NOTE — Telephone Encounter (Signed)
"  Cathie Hoops calling about my mother, Angel [name].  No return call from Dr. Nelva Bush about her bilateral scar tissue pain.  This is not urgent but need a call as soon as possible"
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
## 2016-03-23 NOTE — Telephone Encounter (Signed)
See phone entered by this RN per transfer of VM.

## 2016-03-24 ENCOUNTER — Telehealth: Payer: Self-pay | Admitting: *Deleted

## 2016-03-24 NOTE — Telephone Encounter (Signed)
This RN reviewed call per daughter - Angel [name] concerns per call yesterdat.  Per MD review - best to increase Norco at this time vs initiated another narcotic that could affect pt's sensorium.  This RN also received an VM from [place] at [facility] who stated referral was received and is under review with probable appointment in late August.  This RN spoke with Angel [name] today per above - advised about recommendation to increase Norco at present - if not beneficial pt may need to be seen so medications can be be reviewed with pt exam for best outcome.  Angel [name] verbalized understanding as well as to call this RN next week with update on pt's pain.

## 2016-03-24 NOTE — Telephone Encounter (Signed)
Receivede TC from pt's daughter, Cathie Hoops. She states she spoke with Val, RN with Dr. Jana Hakim yesterday about her mother's pain.  [name] states that her mother will need a referral for the [facility] and also that there are no openings until September/October.  [name] also states that her mother has not tried Neurontin and is willing to try that.  [name] is asking for a call back  @ [phone]

## 2016-03-24 NOTE — Telephone Encounter (Signed)
"  I need to speak with the doctor's nurse.  We spoke yesterday about my mom and need to talk further today."  Call transferred ext [phone].  Accepted voicemail.

## 2016-03-25 NOTE — Telephone Encounter (Signed)
See entry per this RN communication with dtrMariann Laster per her inquiry.

## 2016-03-29 ENCOUNTER — Other Ambulatory Visit: Payer: Self-pay | Admitting: *Deleted

## 2016-03-29 ENCOUNTER — Telehealth: Payer: Self-pay

## 2016-03-29 MED ORDER — HYDROCODONE-ACETAMINOPHEN 10-325 MG PO TABS: 2.0000 | ORAL_TABLET | ORAL | Status: DC | PRN

## 2016-03-29 NOTE — Telephone Encounter (Signed)
S/w pt the Rx is ready for pickup. She will come by Wed AM to pick it up. She has enough norco through tomorrow.

## 2016-03-29 NOTE — Telephone Encounter (Signed)
Angel [name] called with update on pt. She uses norco 10 mg 2 tabs every 4 hours prn, this helps on her other pains in her extremities but not with the scar tissue pain in her breasts. When she has scar tissue pain she has to go to bed for 1-2 days. The pt needs a new rx for the increase in the norco that was instituted on Friday. The pt is willing to do PT. Angel [name] talked about the pain clinic not being available until late August.

## 2016-03-29 NOTE — Telephone Encounter (Signed)
Obtained new script and placed in book for pick up.

## 2016-04-02 ENCOUNTER — Other Ambulatory Visit: Payer: Self-pay | Admitting: Family Medicine

## 2016-04-02 NOTE — Telephone Encounter (Signed)
Last seen 07/31/15 and filled 03/12/16 #45 UDS 12/13/14 low risk.    Please advise   KP

## 2016-04-05 NOTE — Telephone Encounter (Signed)
Faxed hardcopy for Valium to [facility] on [address]

## 2016-04-07 ENCOUNTER — Ambulatory Visit: Payer: Medicare Other

## 2016-04-07 ENCOUNTER — Other Ambulatory Visit: Payer: Medicare Other

## 2016-04-09 ENCOUNTER — Other Ambulatory Visit: Payer: Self-pay

## 2016-04-14 ENCOUNTER — Ambulatory Visit (HOSPITAL_BASED_OUTPATIENT_CLINIC_OR_DEPARTMENT_OTHER): Payer: Medicare Other

## 2016-04-14 ENCOUNTER — Other Ambulatory Visit (HOSPITAL_BASED_OUTPATIENT_CLINIC_OR_DEPARTMENT_OTHER): Payer: Medicare Other

## 2016-04-14 VITALS — HR 69 | Temp 97.7°F

## 2016-04-14 DIAGNOSIS — M810 Age-related osteoporosis without current pathological fracture: Secondary | ICD-10-CM

## 2016-04-14 DIAGNOSIS — C50919 Malignant neoplasm of unspecified site of unspecified female breast: Secondary | ICD-10-CM

## 2016-04-14 DIAGNOSIS — C50911 Malignant neoplasm of unspecified site of right female breast: Secondary | ICD-10-CM

## 2016-04-14 DIAGNOSIS — Z5111 Encounter for antineoplastic chemotherapy: Secondary | ICD-10-CM

## 2016-04-14 LAB — CBC WITH DIFFERENTIAL/PLATELET
BASO%: 0.6 % (ref 0.0–2.0)
Basophils Absolute: 0 10*3/uL (ref 0.0–0.1)
EOS%: 0.4 % (ref 0.0–7.0)
Eosinophils Absolute: 0 10*3/uL (ref 0.0–0.5)
HCT: 38.8 % (ref 34.8–46.6)
HGB: 12.8 g/dL (ref 11.6–15.9)
LYMPH%: 27.2 % (ref 14.0–49.7)
MCH: 29.9 pg (ref 25.1–34.0)
MCHC: 32.9 g/dL (ref 31.5–36.0)
MCV: 90.8 fL (ref 79.5–101.0)
MONO#: 0.4 10*3/uL (ref 0.1–0.9)
MONO%: 5.4 % (ref 0.0–14.0)
NEUT%: 66.4 % (ref 38.4–76.8)
NEUTROS ABS: 5 10*3/uL (ref 1.5–6.5)
PLATELETS: 245 10*3/uL (ref 145–400)
RBC: 4.27 10*6/uL (ref 3.70–5.45)
RDW: 12.8 % (ref 11.2–14.5)
WBC: 7.6 10*3/uL (ref 3.9–10.3)
lymph#: 2.1 10*3/uL (ref 0.9–3.3)

## 2016-04-14 MED ORDER — FULVESTRANT 250 MG/5ML IM SOLN
500.0000 mg | Freq: Once | INTRAMUSCULAR | Status: AC
  Administered 2016-04-14: 500 mg via INTRAMUSCULAR
  Filled 2016-04-14: qty 10

## 2016-04-14 NOTE — Patient Instructions (Signed)

## 2016-04-15 LAB — CANCER ANTIGEN 27.29: CA 27.29: 14.6 U/mL (ref 0.0–38.6)

## 2016-04-19 ENCOUNTER — Telehealth: Payer: Self-pay | Admitting: Cardiology

## 2016-04-19 DIAGNOSIS — E785 Hyperlipidemia, unspecified: Secondary | ICD-10-CM

## 2016-04-19 NOTE — Telephone Encounter (Signed)
Ne Message  Pt wanted to fasting labs on 8/14- no order in epic. Please advise

## 2016-04-19 NOTE — Telephone Encounter (Signed)
Orders placed and linked with pending lab appointment on 05/10/16. Pt aware.

## 2016-04-20 DIAGNOSIS — J45909 Unspecified asthma, uncomplicated: Secondary | ICD-10-CM | POA: Diagnosis not present

## 2016-04-21 ENCOUNTER — Telehealth: Payer: Self-pay | Admitting: Oncology

## 2016-04-21 ENCOUNTER — Ambulatory Visit (HOSPITAL_BASED_OUTPATIENT_CLINIC_OR_DEPARTMENT_OTHER): Payer: Medicare Other | Admitting: Oncology

## 2016-04-21 VITALS — BP 127/66 | HR 80 | Temp 98.0°F | Resp 18 | Ht 62.0 in | Wt 124.9 lb

## 2016-04-21 DIAGNOSIS — Z170 Estrogen receptor positive status [ER+]: Secondary | ICD-10-CM

## 2016-04-21 DIAGNOSIS — C7989 Secondary malignant neoplasm of other specified sites: Secondary | ICD-10-CM | POA: Diagnosis not present

## 2016-04-21 DIAGNOSIS — M810 Age-related osteoporosis without current pathological fracture: Secondary | ICD-10-CM | POA: Diagnosis not present

## 2016-04-21 DIAGNOSIS — C50911 Malignant neoplasm of unspecified site of right female breast: Secondary | ICD-10-CM | POA: Diagnosis not present

## 2016-04-21 MED ORDER — DIAZEPAM 5 MG PO TABS: ORAL_TABLET | ORAL | 0 refills | Status: DC

## 2016-04-21 MED ORDER — GABAPENTIN 300 MG PO CAPS: 300.0000 mg | ORAL_CAPSULE | Freq: Every day | ORAL | 4 refills | Status: DC

## 2016-04-21 NOTE — Progress Notes (Signed)
Angel [name]  [phone] [phone]   ID: Angel [name] DOB: [birth year]  MR#: [id number]  JTT#:[id number]  Patient Care Team: Ann Held, DO as PCP - General (Family Medicine) [name], MD as Consulting Physician (Oncology) [name], MD as Consulting Physician (Cardiology) OTHER M.D.: Maisie Fus Kurup (ophth)  CHIEF COMPLAINT: Chest wall recurrence of breast cancer  CURRENT TREATMENT: Fulvestrant, Denosumab/Prolia  BREAST CANCER HISTORY: Per Dr. 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 She had a second right breast cancer in 2012, with right mastectomy by Dr Angel [name] 07-29-2011 for T1N0M0 ER/PR positive, HER-2 negative invasive ductal carcinoma; she also had what was planned as prophylactic left mastectomy, with unexpected finding of left DCIS in that pathology. Plan in 09-2011 was to begin tamoxifen, chosen particularly due to low bone density. Patient did not keep follow up visits at this office, stopped tamoxifen due intolerance (tho she does not remember what problem she had with this) and was treated with raloxifene by Dr Angel [name].  She was seen next at this office on 12-26-2013 with an enlarging nodular area above right mastectomy scar,reportedly present for several months. Excisional biopsy done 01-21-14 found grade 2 invasive ductal carcinoma with involvement at [facility]) and lymphovascular space invasion, ER positive 100%, PR positive 75%, proliferation marker 80% and HER-2 negative by [facility] (pathology [phone]). PET 02-15-14 did not identify any involvement beyond right chest wall. She had 4500 cGy as electron beam radiation from 5-27 thru 03-27-14 to right chest wall. By 04-30-14 there were multiple tiny nodules in region of right mastectomy scar."  The patient's subsequent history is as detailed  below  INTERVAL HISTORY: Angel [name] returns today for follow up of her risk cancer. She continues on monthly fulvestrant, which she tolerates well. She receives Prolia every 6 months.  Since the last visit here we repeated a bone scan which shows no new areas of abnormal uptake. 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 She has significant degenerative disease.  REVIEW OF SYSTEMS: Angel [name] has dyed her hair and it takes off about 10 years out from her biological age. She describes herself is moderately fatigued. She continues to have chest wall pain due to scarring. She also has significant bony pains because of her arthritis. She takes hydrocodone/APAP for the pain. She has found that oxycodone does not work for her. This does not constipate her. In addition she is short of breath particularly when walking upstairs. She has a minimal dry cough at times. She has rare problems with nausea. She bruises easily. A detailed review of systems today was otherwise stable  PAST MEDICAL HISTORY: Past Medical History:  Diagnosis Date  . Anxiety   . Asthma   . Breast cancer ([place]) 05/07/1999, 06/2011   a. s/p bilat mastectomies. recurrence 01/2014. Radiation, meds.  . Cancer of chest (wall) ([place])   . Cataract    eye surgery planned for 11/2014  . Chest wall recurrence of breast cancer ([place]) 2016  . Cholelithiasis   . Chronic bronchitis   . COPD (chronic obstructive pulmonary disease) ([place])   . Coronary artery disease Non-obstructive   a. 2012 Cath: LAD 40-8m>Med Rx;  b. 11/2013 Echo: EF 50-55%, nl wall motion;  c. 12/2013 Lexi MV: EF 74%, no ischemia.  . DDD (degenerative disc  disease)   . Degenerative joint disease   . Depression   . Dyslipidemia   . GERD (gastroesophageal reflux disease)   . Hx of radiation therapy 07/02/1999- 08/11/1999   right supraclavucular/axillary region: 5040 cGy, 28 fractions  . Hx of radiation  therapy 02/20/14- 03/27/14   right chest wall 4500 cGy 25 sessions  . Hypertension   . Kidney stones    one stones  . Osteoarthritis   . Osteoporosis   . Pre-diabetes   . PVC's (premature ventricular contractions)   . Reflux esophagitis   . Skin cancer of face    non melanoma  . Spinal stenosis     PAST SURGICAL HISTORY: Past Surgical History:  Procedure Laterality Date  . ABDOMINAL HYSTERECTOMY     in her 30's  . APPENDECTOMY    . BREAST BIOPSY Right 01/21/2014   Procedure: BREAST/CHEST WALL BIOPSY;  Surgeon: [name], MD;  Location: [facility];  Service: General;  Laterality: Right;  . BREAST LUMPECTOMY  2002   right breast  . CARDIAC CATHETERIZATION  03/2011, 3/15  . LEFT HEART CATHETERIZATION WITH CORONARY ANGIOGRAM N/A 12/05/2013   Procedure: LEFT HEART CATHETERIZATION WITH CORONARY ANGIOGRAM;  Surgeon: [name], MD;  Location: [facility] CATH LAB;  Service: Cardiovascular;  Laterality: N/A;  . LEFT HEART CATHETERIZATION WITH CORONARY ANGIOGRAM N/A 11/06/2014   Procedure: LEFT HEART CATHETERIZATION WITH CORONARY ANGIOGRAM;  Surgeon: [name], MD;  Location: [facility] CATH LAB;  Service: Cardiovascular;  Laterality: N/A;  . MASTECTOMY  07/29/11   bilateral-rt nodes-none on left  . SMALL INTESTINE SURGERY  1998   SBO  . TONSILLECTOMY    . TUBAL LIGATION  1961  . VEIN LIGATION AND STRIPPING Right   . VESICOVAGINAL FISTULA CLOSURE W/ TAH  age 54    FAMILY HISTORY Family History  Problem Relation Age of Onset  . Pancreatic cancer Mother   . Cancer Mother     pancreatic  . Asthma Brother   . Asthma Daughter   . Stroke Daughter   . Hypertension Daughter   . Asthma Grandchild   . Asthma Brother     deceased  . Heart disease Father   . Emphysema Father   . Heart disease Brother   . Lymphoma Brother    the patient's father died at the age of 78 from heart disease. He was a smoker. The patient's mother died at the age of 68 with cancer of the pancreas. The patient  has 3 brothers, no sisters. There is no history of breast or ovarian cancer in the family to her knowledge.  GYNECOLOGIC HISTORY:  No LMP recorded. Patient has had a hysterectomy. Menarche age 81, first live birth age 8. She is GX P4. She stopped having periods in her 75s. She took hormone replacement for approximately 10 years. 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 She has been "single" for 40 years. Her oldest daughter died from a subarachnoid hemorrhage at age 54, 2 years ago. This is when the patient had undergone treatment for invasive breast cancer and she tells me she was simply overwhelmed and could not start the planned antiestrogen therapy. The next daughter, [name], is a retired Software engineer. She lives in [place]. Next child, Freda Munro, works in Charity fundraiser. She also lives in [place]. The youngest, [name], is retired from the Office manager position. He also owned his own business. The patient has  7 grandchildren and 2 many great-grandchildren to count. She attends the [employer]    ADVANCED DIRECTIVES: In place; the patient's daughter Freda Munro is her healthcare power of attorney. She can be reached at [place]: Social History  Substance Use Topics  . Smoking status: Never Smoker  . Smokeless tobacco: Never Used  . Alcohol use Yes     Comment: occ wine     Colonoscopy:  PAP:  Bone density: August 2015   Lipid panel:  Allergies  Allergen Reactions  . Penicillins Rash    Has patient had a PCN reaction causing immediate rash, facial/tongue/throat swelling, SOB or lightheadedness with hypotension: Yes Has patient had a PCN reaction causing severe rash involving mucus membranes or skin necrosis: No Has patient had a PCN reaction that required hospitalization No Has patient had a PCN reaction occurring within the last 10  years: No If all of the above answers are "NO", then may proceed with Cephalosporin use.   . Adhesive [Tape] Other (See Comments)    Pt prefers to use paper tape    Current Outpatient Prescriptions  Medication Sig Dispense Refill  . acetaminophen (TYLENOL) 650 MG CR tablet Take 650 mg by mouth every 8 (eight) hours as needed for pain. Reported on 10/29/2015    . Artificial Tear Ointment (DRY EYES OP) Apply 2 drops to eye daily as needed (dry eyes).    [name] aspirin EC 81 MG tablet Take 2 tablets (162 mg total) by mouth daily.    . B Complex-Biotin-FA (B-COMPLEX PO) Take 1 tablet by mouth daily.     . Biotin 5000 MCG CAPS Take 5,000 mcg by mouth daily.    . budesonide-formoterol (SYMBICORT) 160-4.5 MCG/ACT inhaler Inhale 2 puffs into the lungs 2 (two) times daily as needed. (Patient taking differently: Inhale 2 puffs into the lungs 2 (two) times daily as needed (shortness of breath). ) 3 Inhaler 1  . Calcium-Magnesium-Zinc 1000-400-15 MG TABS Take 1 tablet by mouth daily.      . celecoxib (CELEBREX) 200 MG capsule Take 1 capsule daily with food for pain & inflammation 90 capsule 3  . Cholecalciferol (VITAMIN D-3) 5000 UNITS TABS Take 1 tablet by mouth daily.     . citalopram (CELEXA) 40 MG tablet Take 1 tablet (40 mg total) by mouth daily. Office visit due now 90 tablet 0  . diazepam (VALIUM) 5 MG tablet TAKE ONE-HALF TO ONE TABLET BY MOUTH EVERY 8 HOURS AS NEEDED FOR ANXIETY 45 tablet 0  . DULoxetine (CYMBALTA) 60 MG capsule Take 1 capsule by mouth  every day for mood 90 capsule 1  . ezetimibe (ZETIA) 10 MG tablet Take 1 tablet (10 mg total) by mouth daily.    . Fish Oil OIL Take 1 capsule by mouth daily.     . Flaxseed, Linseed, (GROUND FLAX SEEDS PO) Take 5 mLs by mouth daily.     [name] gabapentin (NEURONTIN) 300 MG capsule Take 1 capsule (300 mg total) by mouth at bedtime. 90 capsule 4  . HYDROcodone-acetaminophen (NORCO) 10-325 MG tablet Take 2 tablets by mouth every 4 (four) hours as needed for  moderate pain. 60 tablet 0  . isosorbide mononitrate (IMDUR) 30 MG 24 hr tablet Take 1 tablet (30 mg total) by mouth daily. 90 tablet 3  . montelukast (SINGULAIR) 10 MG tablet Take 1 tablet (10 mg total) by mouth at bedtime. 90 tablet 3  . Multiple Vitamin (MULTIVITAMIN) tablet Take 1 tablet by mouth  daily.    . ondansetron (ZOFRAN ODT) 4 MG disintegrating tablet Take 1 tablet (4 mg total) by mouth every 8 (eight) hours as needed for nausea or vomiting. 20 tablet 0   No current facility-administered medications for this visit.     OBJECTIVE: Elderly white woman  In no acute distress Vitals:   04/21/16 0941  BP: 127/66  Pulse: 80  Resp: 18  Temp: 98 F (36.7 C)     Body mass index is 22.84 kg/m.    ECOG FS:1 - Symptomatic but completely ambulatory  Sclerae unicteric, EOMs intact, left eye injury as previously noted Oropharynx clear and moist No cervical or supraclavicular adenopathy Lungs no rales or rhonchi Heart regular rate and rhythm Abd soft, nontender, positive bowel sounds MSK no focal spinal tenderness, no upper extremity lymphedema Neuro: nonfocal, well oriented, appropriate affect Breasts: Status post bilateral mastectomies. No evidence of disease activity on the chest wall or axillae.     LAB RESULTS:  CMP     Component Value Date/Time   NA 136 10/29/2015 1127   K 4.6 10/29/2015 1127   CL 104 07/23/2015 1407   CO2 27 10/29/2015 1127   GLUCOSE 96 10/29/2015 1127   BUN 16.3 10/29/2015 1127   CREATININE 0.8 10/29/2015 1127   CALCIUM 9.5 10/29/2015 1127   PROT 7.3 10/29/2015 1127   ALBUMIN 4.1 10/29/2015 1127   AST 18 10/29/2015 1127   ALT 12 10/29/2015 1127   ALKPHOS 40 10/29/2015 1127   BILITOT 0.51 10/29/2015 1127   GFRNONAA >60 07/23/2015 1407   GFRNONAA 73 05/01/2014 1050   GFRAA >60 07/23/2015 1407   GFRAA 84 05/01/2014 1050    I No results found for: SPEP  Lab Results  Component Value Date   WBC 7.6 04/14/2016   NEUTROABS 5.0 04/14/2016    HGB 12.8 04/14/2016   HCT 38.8 04/14/2016   MCV 90.8 04/14/2016   PLT 245 04/14/2016      Chemistry      Component Value Date/Time   NA 136 10/29/2015 1127   K 4.6 10/29/2015 1127   CL 104 07/23/2015 1407   CO2 27 10/29/2015 1127   BUN 16.3 10/29/2015 1127   CREATININE 0.8 10/29/2015 1127      Component Value Date/Time   CALCIUM 9.5 10/29/2015 1127   ALKPHOS 40 10/29/2015 1127   AST 18 10/29/2015 1127   ALT 12 10/29/2015 1127   BILITOT 0.51 10/29/2015 1127       Lab Results  Component Value Date   LABCA2 12 11/26/2015    No components found for: [id number]  No results for input(s): INR in the last 168 hours.  Urinalysis    Component Value Date/Time   COLORURINE YELLOW 02/14/2015 2010   APPEARANCEUR CLOUDY (A) 02/14/2015 2010   LABSPEC 1.008 02/14/2015 2010   PHURINE 6.5 02/14/2015 2010   GLUCOSEU NEGATIVE 02/14/2015 2010   HGBUR MODERATE (A) 02/14/2015 2010   BILIRUBINUR neg 04/25/2015 1015   [place] NEGATIVE 02/14/2015 2010   PROTEINUR neg 04/25/2015 1015   PROTEINUR NEGATIVE 02/14/2015 2010   UROBILINOGEN 0.2 04/25/2015 1015   UROBILINOGEN 0.2 02/14/2015 2010   NITRITE neg 04/25/2015 1015   NITRITE NEGATIVE 02/14/2015 2010   LEUKOCYTESUR Negative 04/25/2015 1015    STUDIES: CLINICAL DATA:  Recurrent breast malignancy  EXAM: NUCLEAR MEDICINE WHOLE BODY BONE SCAN  TECHNIQUE: Whole body anterior and posterior images were obtained approximately 3 hours after intravenous injection of radiopharmaceutical.  RADIOPHARMACEUTICALS:  88.8 millicuries mCi [id number] MDP  IV  COMPARISON:  Nuclear bone scan dated July 24, 2015  FINDINGS: There is adequate uptake of the radiopharmaceutical by the skeleton. There is adequate soft tissue clearance and renal activity.  Again demonstrated is moderately increased uptake in the anterior aspects of the right third through eighth ribs. There is persistent increased uptake in the lateral aspect of the  right sternal body. There is stable mildly increased uptake at T1 posteriorly. Mildly increased uptake in the lower lumbar spine is stable and compatible with degenerative change. Uptake within the pelvis is normal. Uptake within the calvarium, upper extremities, and lower extremities is not suspicious for metastatic disease. There is increased uptake in the lateral aspect of the right knee compatible with degenerative change.  IMPRESSION: 1. No areas of new abnormal uptake within the skeleton to suggest progressive metastatic disease. 2. Right rib uptake and right scapular uptake little changed from the previous study possibly related to radiation necrosis given the pattern of distribution. 3. Uptake elsewhere as described most compatible with degenerative change.   Electronically Signed   By: David  Martinique M.D.   On: 02/17/2016 14:48  ASSESSMENT: 82 y.o. [place] woman with locally recurrent breast cancer, as follows:  (1) status post right lumpectomy and sentinel node sampling September 2000 for ductal carcinoma in situ (Ni+), estrogen and progesterone receptor positive, with a low proliferation index,  (a) received 5040 cGy to the right breast and regional lymph nodes  (b) on tamoxifen less than 3 months  (2) s/p bilateral mastectomies and right axillary lymph node sampling 07/29/2011, showing  (a) in the left breast, ductal carcinoma in situ measuring 1.2 cm, grade 1, with negative margins, estrogen and progesterone receptor positive  (b)  on the right, a pT1c pN0, stage IA invasive ductal carcinoma, estrogen receptor and 93% positive, progesterone receptor 87% positive, with an MIB-1 of 35%, and no HER-2 amplification margins were close but negative  (c) did not receive adjuvant antiestrogen therapy  (3) right chest wall skin recurrence resected 01/21/2014, measuring 1.7 cm, with positive margins, estrogen receptor 100% positive, progesterone receptor 75% positive, with  an MIB-1 of 80%, and no HER-2 amplification; PET scan 02/15/2014 negative except for Right chest wall focus  (a) received 45 Gy electron-beam therapy to the right chest wall completed 03/27/2014  (b) skin progression within and without the radiation field noted 04/30/2014  (c) letrozole started 05/13/2014, discontinued March 2016 with progression   (4) dexa scan 05/09/2014 at [facility] shows osteoporosis with a T score of -2.9  (5) Fulvestrant started 12/25/2014  (6) osteopenia, on DEXA scan 05/09/2014  (a) started Denosumab/prolia 08/06/2015, most recent dose 02/18/2016  (7) Chronic pain: Followed by Dr. Nelva Bush  PLAN: Ellia continues to do remarkably well, and there is no evidence of cancer activity at present.  We are continuing the fulvestrant indefinitely. She is tolerating it well.  She is transitioning from Dr. Audelia Hives care to a pain clinic. She is wary that there will be a month or 2 there which she will not have any 1 prescribing her Norco. We will be glad to fill in this Until she is established in the pain clinic.  I think she may benefit from Neurontin particularly at bedtime and I have written her a prescription for that. I also refilled her Valium today.  She will see me again in October. I am not planning to repeat a bone scan before that visit. Likely before the end of the year we will do additional staging.  She knows to call for any problems that may develop before then. [name], MD   04/21/2016 9:47 AM a

## 2016-04-21 NOTE — Telephone Encounter (Signed)
appt made and avs printed °

## 2016-04-21 NOTE — Addendum Note (Signed)
Addended by: [name] on: 04/21/2016 10:16 AM   Modules accepted: Orders

## 2016-04-26 ENCOUNTER — Other Ambulatory Visit: Payer: Self-pay | Admitting: Family Medicine

## 2016-04-26 DIAGNOSIS — F329 Major depressive disorder, single episode, unspecified: Secondary | ICD-10-CM

## 2016-04-26 DIAGNOSIS — F32A Depression, unspecified: Secondary | ICD-10-CM

## 2016-04-27 ENCOUNTER — Ambulatory Visit: Payer: Medicare Other | Attending: Oncology | Admitting: Physical Therapy

## 2016-05-03 ENCOUNTER — Ambulatory Visit (INDEPENDENT_AMBULATORY_CARE_PROVIDER_SITE_OTHER): Payer: Medicare Other | Admitting: Family Medicine

## 2016-05-03 ENCOUNTER — Encounter: Payer: Self-pay | Admitting: *Deleted

## 2016-05-03 VITALS — BP 108/68 | Temp 98.0°F | Ht 62.0 in | Wt 125.2 lb

## 2016-05-03 DIAGNOSIS — F329 Major depressive disorder, single episode, unspecified: Secondary | ICD-10-CM

## 2016-05-03 DIAGNOSIS — F32A Depression, unspecified: Secondary | ICD-10-CM

## 2016-05-03 DIAGNOSIS — J4520 Mild intermittent asthma, uncomplicated: Secondary | ICD-10-CM | POA: Diagnosis not present

## 2016-05-03 DIAGNOSIS — I2510 Atherosclerotic heart disease of native coronary artery without angina pectoris: Secondary | ICD-10-CM

## 2016-05-03 DIAGNOSIS — E785 Hyperlipidemia, unspecified: Secondary | ICD-10-CM | POA: Diagnosis not present

## 2016-05-03 DIAGNOSIS — G894 Chronic pain syndrome: Secondary | ICD-10-CM | POA: Diagnosis not present

## 2016-05-03 LAB — COMPREHENSIVE METABOLIC PANEL
ALK PHOS: 31 U/L — AB (ref 39–117)
ALT: 10 U/L (ref 0–35)
AST: 17 U/L (ref 0–37)
Albumin: 4 g/dL (ref 3.5–5.2)
BILIRUBIN TOTAL: 0.4 mg/dL (ref 0.2–1.2)
BUN: 13 mg/dL (ref 6–23)
CO2: 31 meq/L (ref 19–32)
CREATININE: 0.65 mg/dL (ref 0.40–1.20)
Calcium: 9.2 mg/dL (ref 8.4–10.5)
Chloride: 101 mEq/L (ref 96–112)
GFR: 92.81 mL/min (ref >60.00)
GLUCOSE: 93 mg/dL (ref 70–99)
Potassium: 4.2 mEq/L (ref 3.5–5.1)
Sodium: 136 mEq/L (ref 135–145)
TOTAL PROTEIN: 6.7 g/dL (ref 6.0–8.3)

## 2016-05-03 LAB — LIPID PANEL
CHOL/HDL RATIO: 4
Cholesterol: 217 mg/dL — ABNORMAL HIGH (ref 0–200)
HDL: 57.5 mg/dL (ref >39.00)
LDL Cholesterol: 138 mg/dL — ABNORMAL HIGH (ref 0–99)
NONHDL: 159.86
Triglycerides: 110 mg/dL (ref 0.0–149.0)
VLDL: 22 mg/dL (ref 0.0–40.0)

## 2016-05-03 MED ORDER — BIOTIN 5000 MCG PO CAPS: 5000.0000 ug | ORAL_CAPSULE | Freq: Every day | ORAL | 3 refills | Status: DC

## 2016-05-03 MED ORDER — CITALOPRAM HYDROBROMIDE 20 MG PO TABS: 20.0000 mg | ORAL_TABLET | Freq: Every day | ORAL | 1 refills | Status: DC

## 2016-05-03 MED ORDER — ISOSORBIDE MONONITRATE ER 30 MG PO TB24: 30.0000 mg | ORAL_TABLET | Freq: Every day | ORAL | 3 refills | Status: DC

## 2016-05-03 MED ORDER — BUDESONIDE-FORMOTEROL FUMARATE 160-4.5 MCG/ACT IN AERO: 2.0000 | INHALATION_SPRAY | Freq: Two times a day (BID) | RESPIRATORY_TRACT | 1 refills | Status: DC | PRN

## 2016-05-03 MED ORDER — DULOXETINE HCL 60 MG PO CPEP: ORAL_CAPSULE | ORAL | 1 refills | Status: DC

## 2016-05-03 MED ORDER — CELECOXIB 200 MG PO CAPS: ORAL_CAPSULE | ORAL | 3 refills | Status: DC

## 2016-05-03 MED ORDER — MONTELUKAST SODIUM 10 MG PO TABS: 10.0000 mg | ORAL_TABLET | Freq: Every day | ORAL | 3 refills | Status: DC

## 2016-05-03 NOTE — Progress Notes (Signed)
Patient ID: Angel [name], female    DOB: 07/07/1932  Age: 82 y.o. MRN: [id number]    Subjective:  Subjective  HPI [name] presents for f/u cholesterol and bp.  She sees Dr Nelva Bush for chronic pain and Dr Jana Hakim for breast cancer.     Review of Systems  Constitutional: Positive for fatigue. Negative for activity change, appetite change and unexpected weight change.  Respiratory: Negative for cough and shortness of breath.   Cardiovascular: Negative for chest pain and palpitations.  Psychiatric/Behavioral: Negative for behavioral problems and dysphoric mood. The patient is not nervous/anxious.     History Past Medical History:  Diagnosis Date  . Anxiety   . Asthma   . Breast cancer ([place]) 05/07/1999, 06/2011   a. s/p bilat mastectomies. recurrence 01/2014. Radiation, meds.  . Cancer of chest (wall) ([place])   . Cataract    eye surgery planned for 11/2014  . Chest wall recurrence of breast cancer ([place]) 2016  . Cholelithiasis   . Chronic bronchitis   . COPD (chronic obstructive pulmonary disease) ([place])   . Coronary artery disease Non-obstructive   a. 2012 Cath: LAD 40-80m->Med Rx;  b. 11/2013 Echo: EF 50-55%, nl wall motion;  c. 12/2013 Lexi MV: EF 74%, no ischemia.  . DDD (degenerative disc disease)   . Degenerative joint disease   . Depression   . Dyslipidemia   . GERD (gastroesophageal reflux disease)   . Hx of radiation therapy 07/02/1999- 08/11/1999   right supraclavucular/axillary region: 5040 cGy, 28 fractions  . Hx of radiation therapy 02/20/14- 03/27/14   right chest wall 4500 cGy 25 sessions  . Hypertension   . Kidney stones    one stones  . Osteoarthritis   . Osteoporosis   . Pre-diabetes   . PVC's (premature ventricular contractions)   . Reflux esophagitis   . Skin cancer of face    non melanoma  . Spinal stenosis     She has a past surgical history that includes Vesicovaginal fistula closure w/ TAH (age 89); Tubal ligation (1961); Appendectomy;  Abdominal hysterectomy; Small intestine surgery (1998); Tonsillectomy; Breast lumpectomy (2002); Mastectomy (07/29/11); Cardiac catheterization (03/2011, 3/15); Breast biopsy (Right, 01/21/2014); Vein ligation and stripping (Right); left heart catheterization with coronary angiogram (N/A, 12/05/2013); and left heart catheterization with coronary angiogram (N/A, 11/06/2014).   Her family history includes Asthma in her brother, brother, daughter, and grandchild; Cancer in her mother; Emphysema in her father; Heart disease in her brother and father; Hypertension in her daughter; Lymphoma in her brother; Pancreatic cancer in her mother; Stroke in her daughter.She reports that she has never smoked. She has never used smokeless tobacco. She reports that she drinks alcohol. She reports that she does not use drugs.  Current Outpatient Prescriptions on File Prior to Visit  Medication Sig Dispense Refill  . Artificial Tear Ointment (DRY EYES OP) Apply 2 drops to eye daily as needed (dry eyes).    [name] aspirin EC 81 MG tablet Take 2 tablets (162 mg total) by mouth daily.    . B Complex-Biotin-FA (B-COMPLEX PO) Take 1 tablet by mouth daily.     . Calcium-Magnesium-Zinc 1000-400-15 MG TABS Take 1 tablet by mouth daily.      . Cholecalciferol (VITAMIN D-3) 5000 UNITS TABS Take 1 tablet by mouth daily.     . diazepam (VALIUM) 5 MG tablet TAKE ONE-HALF TO ONE TABLET BY MOUTH EVERY 8 HOURS AS NEEDED FOR ANXIETY 45 tablet 0  . ezetimibe (ZETIA) 10 MG tablet  Take 1 tablet (10 mg total) by mouth daily.    . Fish Oil OIL Take 1 capsule by mouth daily.     . Flaxseed, Linseed, (GROUND FLAX SEEDS PO) Take 5 mLs by mouth daily.     [name] HYDROcodone-acetaminophen (NORCO) 10-325 MG tablet Take 2 tablets by mouth every 4 (four) hours as needed for moderate pain. 60 tablet 0  . Multiple Vitamin (MULTIVITAMIN) tablet Take 1 tablet by mouth daily.    . ondansetron (ZOFRAN ODT) 4 MG disintegrating tablet Take 1 tablet (4 mg total) by mouth  every 8 (eight) hours as needed for nausea or vomiting. 20 tablet 0   No current facility-administered medications on file prior to visit.      Objective:  Objective  Physical Exam  Constitutional: She is oriented to person, place, and time. She appears well-developed and well-nourished.  HENT:  Head: Normocephalic and atraumatic.  Eyes: Conjunctivae and EOM are normal.  Neck: Normal range of motion. Neck supple. No JVD present. Carotid bruit is not present. No thyromegaly present.  Cardiovascular: Normal rate, regular rhythm and normal heart sounds.   No murmur heard. Pulmonary/Chest: Effort normal and breath sounds normal. No respiratory distress. She has no wheezes. She has no rales. She exhibits no tenderness.  Musculoskeletal: She exhibits no edema.  Neurological: She is alert and oriented to person, place, and time.  Psychiatric: She has a normal mood and affect. Her behavior is normal. Judgment and thought content normal.  Nursing note and vitals reviewed.  BP 108/68 (BP Location: Left Arm, Patient Position: Sitting, Cuff Size: Small)   Temp 98 F (36.7 C) (Oral)   Ht 5\' 2"  (1.575 m)   Wt 125 lb 3.2 oz (56.8 kg)   SpO2 96%   BMI 22.90 kg/m  Wt Readings from Last 3 Encounters:  05/03/16 125 lb 3.2 oz (56.8 kg)  04/21/16 124 lb 14.4 oz (56.7 kg)  02/18/16 125 lb 11.2 oz (57 kg)     Lab Results  Component Value Date   WBC 7.6 04/14/2016   HGB 12.8 04/14/2016   HCT 38.8 04/14/2016   PLT 245 04/14/2016   GLUCOSE 93 05/03/2016   CHOL 217 (H) 05/03/2016   TRIG 110.0 05/03/2016   HDL 57.50 05/03/2016   LDLCALC 138 (H) 05/03/2016   ALT 10 05/03/2016   AST 17 05/03/2016   NA 136 05/03/2016   K 4.2 05/03/2016   CL 101 05/03/2016   CREATININE 0.65 05/03/2016   BUN 13 05/03/2016   CO2 31 05/03/2016   TSH 3.234 09/06/2014   INR 1.07 07/23/2015   HGBA1C 5.4 09/06/2014   MICROALBUR 0.77 05/01/2014    Nm Bone Scan Whole Body  Result Date: 02/17/2016 CLINICAL DATA:   Recurrent breast malignancy EXAM: NUCLEAR MEDICINE WHOLE BODY BONE SCAN TECHNIQUE: Whole body anterior and posterior images were obtained approximately 3 hours after intravenous injection of radiopharmaceutical. RADIOPHARMACEUTICALS:  123456 millicuries mCi [id number] MDP IV COMPARISON:  Nuclear bone scan dated July 24, 2015 FINDINGS: There is adequate uptake of the radiopharmaceutical by the skeleton. There is adequate soft tissue clearance and renal activity. Again demonstrated is moderately increased uptake in the anterior aspects of the right third through eighth ribs. There is persistent increased uptake in the lateral aspect of the right sternal body. There is stable mildly increased uptake at T1 posteriorly. Mildly increased uptake in the lower lumbar spine is stable and compatible with degenerative change. Uptake within the pelvis is normal. Uptake within the calvarium, upper  extremities, and lower extremities is not suspicious for metastatic disease. There is increased uptake in the lateral aspect of the right knee compatible with degenerative change. IMPRESSION: 1. No areas of new abnormal uptake within the skeleton to suggest progressive metastatic disease. 2. Right rib uptake and right scapular uptake little changed from the previous study possibly related to radiation necrosis given the pattern of distribution. 3. Uptake elsewhere as described most compatible with degenerative change. Electronically Signed   By: David  Martinique M.D.   On: 02/17/2016 14:48     Assessment & Plan:  Plan  I have discontinued Ms. Bufkin's acetaminophen, gabapentin, and citalopram. I have also changed her Biotin. Additionally, I am having her start on citalopram. Lastly, I am having her maintain her Calcium-Magnesium-Zinc, Vitamin D-3, Fish Oil, B Complex-Biotin-FA (B-COMPLEX PO), multivitamin, (Flaxseed, Linseed, (GROUND FLAX SEEDS PO)), ezetimibe, aspirin EC, Artificial Tear Ointment (DRY EYES OP), ondansetron,  HYDROcodone-acetaminophen, diazepam, budesonide-formoterol, celecoxib, DULoxetine, isosorbide mononitrate, and montelukast.  Meds ordered this encounter  Medications  . Biotin 5000 MCG CAPS    Sig: Take 1 capsule (5,000 mcg total) by mouth daily.    Dispense:  90 capsule    Refill:  3  . budesonide-formoterol (SYMBICORT) 160-4.5 MCG/ACT inhaler    Sig: Inhale 2 puffs into the lungs 2 (two) times daily as needed.    Dispense:  3 Inhaler    Refill:  1  . celecoxib (CELEBREX) 200 MG capsule    Sig: Take 1 capsule daily with food for pain & inflammation    Dispense:  90 capsule    Refill:  3  . DULoxetine (CYMBALTA) 60 MG capsule    Sig: Take 1 capsule by mouth  every day for mood    Dispense:  90 capsule    Refill:  1  . isosorbide mononitrate (IMDUR) 30 MG 24 hr tablet    Sig: Take 1 tablet (30 mg total) by mouth daily.    Dispense:  90 tablet    Refill:  3  . montelukast (SINGULAIR) 10 MG tablet    Sig: Take 1 tablet (10 mg total) by mouth at bedtime.    Dispense:  90 tablet    Refill:  3  . citalopram (CELEXA) 20 MG tablet    Sig: Take 1 tablet (20 mg total) by mouth daily.    Dispense:  90 tablet    Refill:  1    Problem List Items Addressed This Visit    None    Visit Diagnoses    Hyperlipidemia    -  Primary   Relevant Medications   isosorbide mononitrate (IMDUR) 30 MG 24 hr tablet   Other Relevant Orders   Comprehensive metabolic panel (Completed)   Lipid panel (Completed)   Asthma, chronic, mild intermittent, uncomplicated       Relevant Medications   Biotin 5000 MCG CAPS   budesonide-formoterol (SYMBICORT) 160-4.5 MCG/ACT inhaler   montelukast (SINGULAIR) 10 MG tablet   Chronic pain syndrome       Relevant Medications   celecoxib (CELEBREX) 200 MG capsule   Depression       Relevant Medications   DULoxetine (CYMBALTA) 60 MG capsule   citalopram (CELEXA) 20 MG tablet   CAD in native artery       Relevant Medications   isosorbide mononitrate (IMDUR) 30 MG  24 hr tablet      Follow-up: Return in about 6 months (around 11/03/2016) for hyperlipidemia.  Ann Held, DO

## 2016-05-03 NOTE — Progress Notes (Signed)
Pre visit review using our clinic review tool, if applicable. No additional management support is needed unless otherwise documented below in the visit note. 

## 2016-05-03 NOTE — Patient Instructions (Signed)

## 2016-05-04 ENCOUNTER — Other Ambulatory Visit: Payer: Self-pay | Admitting: *Deleted

## 2016-05-04 DIAGNOSIS — C50911 Malignant neoplasm of unspecified site of right female breast: Secondary | ICD-10-CM

## 2016-05-04 DIAGNOSIS — G8929 Other chronic pain: Secondary | ICD-10-CM

## 2016-05-04 MED ORDER — GABAPENTIN 100 MG PO CAPS: 100.0000 mg | ORAL_CAPSULE | Freq: Every day | ORAL | 0 refills | Status: DC

## 2016-05-04 NOTE — Telephone Encounter (Signed)
This RN spoke with pt per her call requesting for the 100 mg gabapentin instead of 300 mg due to noted increased somnolence.  Prescription obtained and sent to patient's local pharmacy for fill.  Pt also requested referral for PT to be sent to the " [facility] because it is only 3 miles from where I live "  New referral placed.

## 2016-05-05 ENCOUNTER — Other Ambulatory Visit: Payer: Medicare Other

## 2016-05-05 ENCOUNTER — Ambulatory Visit: Payer: Medicare Other

## 2016-05-10 ENCOUNTER — Other Ambulatory Visit: Payer: Medicare Other

## 2016-05-12 ENCOUNTER — Other Ambulatory Visit: Payer: Self-pay | Admitting: *Deleted

## 2016-05-12 ENCOUNTER — Other Ambulatory Visit (HOSPITAL_BASED_OUTPATIENT_CLINIC_OR_DEPARTMENT_OTHER): Payer: Medicare Other

## 2016-05-12 ENCOUNTER — Ambulatory Visit (HOSPITAL_BASED_OUTPATIENT_CLINIC_OR_DEPARTMENT_OTHER): Payer: Medicare Other

## 2016-05-12 VITALS — BP 104/58 | HR 61 | Temp 97.5°F | Resp 18

## 2016-05-12 DIAGNOSIS — M810 Age-related osteoporosis without current pathological fracture: Secondary | ICD-10-CM

## 2016-05-12 DIAGNOSIS — Z5111 Encounter for antineoplastic chemotherapy: Secondary | ICD-10-CM | POA: Diagnosis not present

## 2016-05-12 DIAGNOSIS — C50911 Malignant neoplasm of unspecified site of right female breast: Secondary | ICD-10-CM

## 2016-05-12 DIAGNOSIS — C50919 Malignant neoplasm of unspecified site of unspecified female breast: Secondary | ICD-10-CM

## 2016-05-12 DIAGNOSIS — G8929 Other chronic pain: Secondary | ICD-10-CM

## 2016-05-12 LAB — CBC WITH DIFFERENTIAL/PLATELET
BASO%: 0.4 % (ref 0.0–2.0)
Basophils Absolute: 0 10*3/uL (ref 0.0–0.1)
EOS ABS: 0.1 10*3/uL (ref 0.0–0.5)
EOS%: 1.9 % (ref 0.0–7.0)
HEMATOCRIT: 37.8 % (ref 34.8–46.6)
HGB: 12.6 g/dL (ref 11.6–15.9)
LYMPH#: 1.9 10*3/uL (ref 0.9–3.3)
LYMPH%: 37.1 % (ref 14.0–49.7)
MCH: 30.4 pg (ref 25.1–34.0)
MCHC: 33.3 g/dL (ref 31.5–36.0)
MCV: 91.3 fL (ref 79.5–101.0)
MONO#: 0.5 10*3/uL (ref 0.1–0.9)
MONO%: 8.9 % (ref 0.0–14.0)
NEUT%: 51.7 % (ref 38.4–76.8)
NEUTROS ABS: 2.7 10*3/uL (ref 1.5–6.5)
PLATELETS: 191 10*3/uL (ref 145–400)
RBC: 4.14 10*6/uL (ref 3.70–5.45)
RDW: 12.8 % (ref 11.2–14.5)
WBC: 5.2 10*3/uL (ref 3.9–10.3)

## 2016-05-12 MED ORDER — FULVESTRANT 250 MG/5ML IM SOLN
500.0000 mg | Freq: Once | INTRAMUSCULAR | Status: AC
Start: 2016-05-12 — End: 2016-05-12
  Administered 2016-05-12: 500 mg via INTRAMUSCULAR
  Filled 2016-05-12: qty 10

## 2016-05-12 MED ORDER — GABAPENTIN 100 MG PO CAPS: ORAL_CAPSULE | ORAL | 3 refills | Status: DC

## 2016-05-12 NOTE — Patient Instructions (Signed)

## 2016-05-13 LAB — CANCER ANTIGEN 27.29: CA 27.29: 18 U/mL (ref 0.0–38.6)

## 2016-05-14 ENCOUNTER — Ambulatory Visit: Payer: Medicare Other | Admitting: Physical Therapy

## 2016-05-16 NOTE — Progress Notes (Signed)
Cardiology Office Note    Date:  05/17/2016   ID:  Angel [name], DOB 03/26/1932, MRN [id number]  PCP:  Ann Held, DO  Cardiologist:  Fransico Him, MD   Chief Complaint  Patient presents with  . Coronary Artery Disease  . Hypertension  . Hyperlipidemia    History of Present Illness:  Angel [name] is a 82 y.o. female with a hx of non-obstructive CAD with mid LAD 40-50% stenosis, DDD, HL, metastatic breast CA, remote asthma, GERD. She has been on several antianginal meds. Ranolazine was added to her medical regimen but she had to stop it after a few days due to severe fatigue. Patient had significant side effects to metoprolol with fatigue and lethargy and it was stopped. She presents back today for routine followup.She denies any anginal chest pain, LE edema, dizziness, palpitations or syncope. She does have some pain in her chest wall due to scar tissue from metastatic CA but has improved.   Past Medical History:  Diagnosis Date  . Anxiety   . Asthma   . Breast cancer ([place]) 05/07/1999, 06/2011   a. s/p bilat mastectomies. recurrence 01/2014. Radiation, meds.  . Cancer of chest (wall) ([place])   . Cataract    eye surgery planned for 11/2014  . Chest wall recurrence of breast cancer ([place]) 2016  . Cholelithiasis   . Chronic bronchitis   . COPD (chronic obstructive pulmonary disease) ([place])   . Coronary artery disease Non-obstructive   a. 2012 Cath: LAD 40-57m->Med Rx;  b. 11/2013 Echo: EF 50-55%, nl wall motion;  c. 12/2013 Lexi MV: EF 74%, no ischemia.  . DDD (degenerative disc disease)   . Degenerative joint disease   . Depression   . Dyslipidemia   . GERD (gastroesophageal reflux disease)   . Hx of radiation therapy 07/02/1999- 08/11/1999   right supraclavucular/axillary region: 5040 cGy, 28 fractions  . Hx of radiation therapy 02/20/14- 03/27/14   right chest wall 4500 cGy 25 sessions  . Hypertension   . Kidney stones    one stones  . Osteoarthritis    . Osteoporosis   . Pre-diabetes   . PVC's (premature ventricular contractions)   . Reflux esophagitis   . Skin cancer of face    non melanoma  . Spinal stenosis     Past Surgical History:  Procedure Laterality Date  . ABDOMINAL HYSTERECTOMY     in her 30's  . APPENDECTOMY    . BREAST BIOPSY Right 01/21/2014   Procedure: BREAST/CHEST WALL BIOPSY;  Surgeon: [name], MD;  Location: [facility];  Service: General;  Laterality: Right;  . BREAST LUMPECTOMY  2002   right breast  . CARDIAC CATHETERIZATION  03/2011, 3/15  . LEFT HEART CATHETERIZATION WITH CORONARY ANGIOGRAM N/A 12/05/2013   Procedure: LEFT HEART CATHETERIZATION WITH CORONARY ANGIOGRAM;  Surgeon: [name], MD;  Location[facility] CATH LAB;  Service: Cardiovascular;  Laterality: N/A;  . LEFT HEART CATHETERIZATION WITH CORONARY ANGIOGRAM N/A 11/06/2014   Procedure: LEFT HEART CATHETERIZATION WITH CORONARY ANGIOGRAM;  Surgeon: [name], MD;  Location: [facility] CATH LAB;  Service: Cardiovascular;  Laterality: N/A;  . MASTECTOMY  07/29/11   bilateral-rt nodes-none on left  . SMALL INTESTINE SURGERY  1998   SBO  . TONSILLECTOMY    . TUBAL LIGATION  1961  . VEIN LIGATION AND STRIPPING Right   . VESICOVAGINAL FISTULA CLOSURE W/ TAH  age 19    Current Medications: Outpatient Medications Prior to Visit  Medication Sig Dispense Refill  . Artificial Tear Ointment (DRY EYES OP) Apply 2 drops to eye daily as needed (dry eyes).    [name] aspirin EC 81 MG tablet Take 2 tablets (162 mg total) by mouth daily.    . B Complex-Biotin-FA (B-COMPLEX PO) Take 1 tablet by mouth daily.     . Biotin 5000 MCG CAPS Take 1 capsule (5,000 mcg total) by mouth daily. 90 capsule 3  . budesonide-formoterol (SYMBICORT) 160-4.5 MCG/ACT inhaler Inhale 2 puffs into the lungs 2 (two) times daily as needed. 3 Inhaler 1  . Calcium-Magnesium-Zinc 1000-400-15 MG TABS Take 1 tablet by mouth daily.      . celecoxib (CELEBREX) 200 MG capsule Take 1 capsule  daily with food for pain & inflammation 90 capsule 3  . Cholecalciferol (VITAMIN D-3) 5000 UNITS TABS Take 1 tablet by mouth daily.     . citalopram (CELEXA) 20 MG tablet Take 1 tablet (20 mg total) by mouth daily. 90 tablet 1  . diazepam (VALIUM) 5 MG tablet TAKE ONE-HALF TO ONE TABLET BY MOUTH EVERY 8 HOURS AS NEEDED FOR ANXIETY 45 tablet 0  . DULoxetine (CYMBALTA) 60 MG capsule Take 1 capsule by mouth  every day for mood 90 capsule 1  . ezetimibe (ZETIA) 10 MG tablet Take 1 tablet (10 mg total) by mouth daily.    . Fish Oil OIL Take 1 capsule by mouth daily.     . Flaxseed, Linseed, (GROUND FLAX SEEDS PO) Take 5 mLs by mouth daily.     [name] gabapentin (NEURONTIN) 100 MG capsule 1-2 po nightly 60 capsule 3  . HYDROcodone-acetaminophen (NORCO) 10-325 MG tablet Take 2 tablets by mouth every 4 (four) hours as needed for moderate pain. 60 tablet 0  . isosorbide mononitrate (IMDUR) 30 MG 24 hr tablet Take 1 tablet (30 mg total) by mouth daily. 90 tablet 3  . montelukast (SINGULAIR) 10 MG tablet Take 1 tablet (10 mg total) by mouth at bedtime. 90 tablet 3  . Multiple Vitamin (MULTIVITAMIN) tablet Take 1 tablet by mouth daily.    . ondansetron (ZOFRAN ODT) 4 MG disintegrating tablet Take 1 tablet (4 mg total) by mouth every 8 (eight) hours as needed for nausea or vomiting. 20 tablet 0   No facility-administered medications prior to visit.      Allergies:   Adhesive [tape] and Penicillins   Social History   Social History  . Marital status: Single    Spouse name: N/A  . Number of children: 4  . Years of education: N/A   Occupational History  . Retired Other    Nursing   Social History Main Topics  . Smoking status: Never Smoker  . Smokeless tobacco: Never Used  . Alcohol use Yes     Comment: occ wine  . Drug use: No  . Sexual activity: Not Asked   Other Topics Concern  . None   Social History Narrative  . None     Family History:  The patient's family history includes Asthma in  her brother, brother, daughter, and grandchild; Cancer in her mother; Emphysema in her father; Heart disease in her brother and father; Hypertension in her daughter; Lymphoma in her brother; Pancreatic cancer in her mother; Stroke in her daughter.   ROS:   Please see the history of present illness.    ROS All other systems reviewed and are negative.   PHYSICAL EXAM:   VS:  BP 118/64   Pulse 69   Ht 5'  2" (1.575 m)   Wt 129 lb 1.9 oz (58.6 kg)   BMI 23.62 kg/m    GEN: Well nourished, well developed, in no acute distress  HEENT: normal  Neck: no JVD, carotid bruits, or masses Cardiac: RRR; no murmurs, rubs, or gallops,no edema.  Intact distal pulses bilaterally.  Respiratory:  clear to auscultation bilaterally, normal work of breathing GI: soft, nontender, nondistended, + BS MS: no deformity or atrophy  Skin: warm and dry, no rash Neuro:  Alert and Oriented x 3, Strength and sensation are intact Psych: euthymic mood, full affect  Wt Readings from Last 3 Encounters:  05/17/16 129 lb 1.9 oz (58.6 kg)  05/03/16 125 lb 3.2 oz (56.8 kg)  04/21/16 124 lb 14.4 oz (56.7 kg)      Studies/Labs Reviewed:   EKG:  EKG is ordered today and showed NSR at 69bpm with no ST changes  Recent Labs: 05/03/2016: ALT 10; BUN 13; Creatinine, Ser 0.65; Potassium 4.2; Sodium 136 05/12/2016: HGB 12.6; Platelets 191   Lipid Panel    Component Value Date/Time   CHOL 217 (H) 05/03/2016 1141   TRIG 110.0 05/03/2016 1141   HDL 57.50 05/03/2016 1141   CHOLHDL 4 05/03/2016 1141   VLDL 22.0 05/03/2016 1141   LDLCALC 138 (H) 05/03/2016 1141    Additional studies/ records that were reviewed today include:  none    ASSESSMENT:    1. Coronary artery disease due to lipid rich plaque   2. Essential hypertension   3. Dyslipidemia      PLAN:  In order of problems listed above:  1. Nonobstructive ASCAD with 40-50% mid LAD with no angina.  Continue  ASA/long acting nitrate.  Add statin. 2. HTN - BP  controlled.  3. Dyslipidemia on Zetia.  Last LDL not at goal (138).  Start Lipitor 20mg  daily and repeat FLP and ALT in 6 weeks.    Medication Adjustments/Labs and Tests Ordered: Current medicines are reviewed at length with the patient today.  Concerns regarding medicines are outlined above.  Medication changes, Labs and Tests ordered today are listed in the Patient Instructions below.  There are no Patient Instructions on file for this visit.   Signed, Fransico Him, MD  05/17/2016 11:07 AM    [address], [place], [place]  [address] Phone: [phone]; Fax: [phone]

## 2016-05-17 ENCOUNTER — Encounter: Payer: Self-pay | Admitting: Cardiology

## 2016-05-17 ENCOUNTER — Ambulatory Visit (INDEPENDENT_AMBULATORY_CARE_PROVIDER_SITE_OTHER): Payer: Medicare Other | Admitting: Cardiology

## 2016-05-17 VITALS — BP 118/64 | HR 69 | Ht 62.0 in | Wt 129.1 lb

## 2016-05-17 DIAGNOSIS — I2510 Atherosclerotic heart disease of native coronary artery without angina pectoris: Secondary | ICD-10-CM | POA: Diagnosis not present

## 2016-05-17 DIAGNOSIS — I10 Essential (primary) hypertension: Secondary | ICD-10-CM

## 2016-05-17 DIAGNOSIS — I2583 Coronary atherosclerosis due to lipid rich plaque: Principal | ICD-10-CM

## 2016-05-17 DIAGNOSIS — E785 Hyperlipidemia, unspecified: Secondary | ICD-10-CM

## 2016-05-17 MED ORDER — ATORVASTATIN CALCIUM 20 MG PO TABS: 20.0000 mg | ORAL_TABLET | Freq: Every day | ORAL | 3 refills | Status: DC

## 2016-05-17 NOTE — Patient Instructions (Signed)
Medication Instructions:  Your physician has recommended you make the following change in your medication:  1. Start Lipitor (20 mg ) daily   Labwork: Your physician recommends that you return for a FASTING lipid profile: on Wednesday, October 4 between 8-5.  Nothing to eat after 12:00 pm the night before.   Testing/Procedures: -None  Follow-Up: Your physician wants you to follow-up in: 1 year with Dr. Radford Pax.  You will receive a reminder letter in the mail two months in advance. If you don't receive a letter, please call our office to schedule the follow-up appointment.   Any Other Special Instructions Will Be Listed Below (If Applicable).     If you need a refill on your cardiac medications before your next appointment, please call your pharmacy.

## 2016-05-21 DIAGNOSIS — J45909 Unspecified asthma, uncomplicated: Secondary | ICD-10-CM | POA: Diagnosis not present

## 2016-05-24 ENCOUNTER — Ambulatory Visit: Payer: Medicare Other | Admitting: Physical Therapy

## 2016-06-02 ENCOUNTER — Other Ambulatory Visit: Payer: Medicare Other

## 2016-06-02 ENCOUNTER — Ambulatory Visit: Payer: Medicare Other

## 2016-06-09 ENCOUNTER — Other Ambulatory Visit (HOSPITAL_BASED_OUTPATIENT_CLINIC_OR_DEPARTMENT_OTHER): Payer: Medicare Other

## 2016-06-09 ENCOUNTER — Other Ambulatory Visit: Payer: Self-pay | Admitting: Oncology

## 2016-06-09 ENCOUNTER — Ambulatory Visit (HOSPITAL_BASED_OUTPATIENT_CLINIC_OR_DEPARTMENT_OTHER): Payer: Medicare Other

## 2016-06-09 VITALS — BP 112/64 | HR 71 | Temp 98.0°F

## 2016-06-09 DIAGNOSIS — Z5111 Encounter for antineoplastic chemotherapy: Secondary | ICD-10-CM

## 2016-06-09 DIAGNOSIS — C50911 Malignant neoplasm of unspecified site of right female breast: Secondary | ICD-10-CM

## 2016-06-09 DIAGNOSIS — C7989 Secondary malignant neoplasm of other specified sites: Secondary | ICD-10-CM

## 2016-06-09 DIAGNOSIS — M810 Age-related osteoporosis without current pathological fracture: Secondary | ICD-10-CM

## 2016-06-09 LAB — CBC WITH DIFFERENTIAL/PLATELET
BASO%: 0.5 % (ref 0.0–2.0)
BASOS ABS: 0 10*3/uL (ref 0.0–0.1)
EOS ABS: 0.1 10*3/uL (ref 0.0–0.5)
EOS%: 1.1 % (ref 0.0–7.0)
HEMATOCRIT: 38.1 % (ref 34.8–46.6)
HEMOGLOBIN: 12.5 g/dL (ref 11.6–15.9)
LYMPH#: 2.2 10*3/uL (ref 0.9–3.3)
LYMPH%: 34.9 % (ref 14.0–49.7)
MCH: 29.7 pg (ref 25.1–34.0)
MCHC: 32.9 g/dL (ref 31.5–36.0)
MCV: 90.2 fL (ref 79.5–101.0)
MONO#: 0.6 10*3/uL (ref 0.1–0.9)
MONO%: 10 % (ref 0.0–14.0)
NEUT%: 53.5 % (ref 38.4–76.8)
NEUTROS ABS: 3.4 10*3/uL (ref 1.5–6.5)
Platelets: 202 10*3/uL (ref 145–400)
RBC: 4.23 10*6/uL (ref 3.70–5.45)
RDW: 12.9 % (ref 11.2–14.5)
WBC: 6.4 10*3/uL (ref 3.9–10.3)

## 2016-06-09 MED ORDER — FULVESTRANT 250 MG/5ML IM SOLN
500.0000 mg | Freq: Once | INTRAMUSCULAR | Status: AC
  Administered 2016-06-09: 500 mg via INTRAMUSCULAR
  Filled 2016-06-09: qty 10

## 2016-06-09 NOTE — Patient Instructions (Signed)

## 2016-06-10 ENCOUNTER — Other Ambulatory Visit: Payer: Self-pay | Admitting: *Deleted

## 2016-06-10 NOTE — Telephone Encounter (Signed)
valium called into pt pharmacy

## 2016-06-21 DIAGNOSIS — J45909 Unspecified asthma, uncomplicated: Secondary | ICD-10-CM | POA: Diagnosis not present

## 2016-06-29 DIAGNOSIS — M5033 Other cervical disc degeneration, cervicothoracic region: Secondary | ICD-10-CM | POA: Diagnosis not present

## 2016-06-29 DIAGNOSIS — M5136 Other intervertebral disc degeneration, lumbar region: Secondary | ICD-10-CM | POA: Diagnosis not present

## 2016-06-30 ENCOUNTER — Other Ambulatory Visit: Payer: Medicare Other

## 2016-06-30 ENCOUNTER — Ambulatory Visit: Payer: Medicare Other

## 2016-07-07 ENCOUNTER — Other Ambulatory Visit: Payer: Self-pay | Admitting: Oncology

## 2016-07-07 ENCOUNTER — Other Ambulatory Visit (HOSPITAL_BASED_OUTPATIENT_CLINIC_OR_DEPARTMENT_OTHER): Payer: Medicare Other

## 2016-07-07 ENCOUNTER — Ambulatory Visit (HOSPITAL_BASED_OUTPATIENT_CLINIC_OR_DEPARTMENT_OTHER): Payer: Medicare Other

## 2016-07-07 VITALS — BP 125/75 | HR 69 | Resp 20

## 2016-07-07 DIAGNOSIS — M810 Age-related osteoporosis without current pathological fracture: Secondary | ICD-10-CM

## 2016-07-07 DIAGNOSIS — C50911 Malignant neoplasm of unspecified site of right female breast: Secondary | ICD-10-CM

## 2016-07-07 DIAGNOSIS — C7989 Secondary malignant neoplasm of other specified sites: Secondary | ICD-10-CM

## 2016-07-07 DIAGNOSIS — Z5111 Encounter for antineoplastic chemotherapy: Secondary | ICD-10-CM

## 2016-07-07 LAB — CBC WITH DIFFERENTIAL/PLATELET
BASO%: 0.7 % (ref 0.0–2.0)
Basophils Absolute: 0.1 10*3/uL (ref 0.0–0.1)
EOS ABS: 0 10*3/uL (ref 0.0–0.5)
EOS%: 0.7 % (ref 0.0–7.0)
HEMATOCRIT: 40.1 % (ref 34.8–46.6)
HEMOGLOBIN: 13.1 g/dL (ref 11.6–15.9)
LYMPH#: 1.9 10*3/uL (ref 0.9–3.3)
LYMPH%: 27.4 % (ref 14.0–49.7)
MCH: 29.5 pg (ref 25.1–34.0)
MCHC: 32.8 g/dL (ref 31.5–36.0)
MCV: 90.1 fL (ref 79.5–101.0)
MONO#: 0.7 10*3/uL (ref 0.1–0.9)
MONO%: 9.4 % (ref 0.0–14.0)
NEUT#: 4.4 10*3/uL (ref 1.5–6.5)
NEUT%: 61.8 % (ref 38.4–76.8)
PLATELETS: 225 10*3/uL (ref 145–400)
RBC: 4.44 10*6/uL (ref 3.70–5.45)
RDW: 13.1 % (ref 11.2–14.5)
WBC: 7.1 10*3/uL (ref 3.9–10.3)

## 2016-07-07 MED ORDER — FULVESTRANT 250 MG/5ML IM SOLN
500.0000 mg | Freq: Once | INTRAMUSCULAR | Status: AC
  Administered 2016-07-07: 500 mg via INTRAMUSCULAR
  Filled 2016-07-07: qty 10

## 2016-07-07 NOTE — Patient Instructions (Signed)

## 2016-07-09 ENCOUNTER — Telehealth: Payer: Self-pay | Admitting: *Deleted

## 2016-07-09 NOTE — Telephone Encounter (Signed)
Daughter called to say patient did not receive her "bone shot" when she was here. Needs to get rescheduled ASAP. Will call back with a date they can bring here. Should be able to get her here next week.

## 2016-07-13 ENCOUNTER — Telehealth: Payer: Self-pay | Admitting: Medical Oncology

## 2016-07-13 NOTE — Telephone Encounter (Signed)
Dtr stated" they failed to give it to her last time and she really needs it"  Requests to get prolia tomorrow . Next appt is 11/08. =she wants a later appt.

## 2016-07-13 NOTE — Telephone Encounter (Signed)
This RN reviewed dates - and sent an inbox message per rescheduling to another day later in the day.

## 2016-07-21 DIAGNOSIS — J45909 Unspecified asthma, uncomplicated: Secondary | ICD-10-CM | POA: Diagnosis not present

## 2016-07-28 ENCOUNTER — Other Ambulatory Visit (HOSPITAL_BASED_OUTPATIENT_CLINIC_OR_DEPARTMENT_OTHER): Payer: Medicare Other

## 2016-07-28 ENCOUNTER — Ambulatory Visit (HOSPITAL_BASED_OUTPATIENT_CLINIC_OR_DEPARTMENT_OTHER): Payer: Medicare Other

## 2016-07-28 ENCOUNTER — Ambulatory Visit (HOSPITAL_BASED_OUTPATIENT_CLINIC_OR_DEPARTMENT_OTHER): Payer: Medicare Other | Admitting: Oncology

## 2016-07-28 VITALS — BP 123/67 | HR 70 | Temp 97.6°F | Resp 17 | Ht 62.0 in | Wt 126.3 lb

## 2016-07-28 DIAGNOSIS — M8580 Other specified disorders of bone density and structure, unspecified site: Secondary | ICD-10-CM | POA: Diagnosis not present

## 2016-07-28 DIAGNOSIS — Z5111 Encounter for antineoplastic chemotherapy: Secondary | ICD-10-CM

## 2016-07-28 DIAGNOSIS — C50911 Malignant neoplasm of unspecified site of right female breast: Secondary | ICD-10-CM

## 2016-07-28 DIAGNOSIS — C7989 Secondary malignant neoplasm of other specified sites: Secondary | ICD-10-CM | POA: Diagnosis not present

## 2016-07-28 DIAGNOSIS — Z170 Estrogen receptor positive status [ER+]: Secondary | ICD-10-CM

## 2016-07-28 DIAGNOSIS — M810 Age-related osteoporosis without current pathological fracture: Secondary | ICD-10-CM

## 2016-07-28 LAB — CBC WITH DIFFERENTIAL/PLATELET
BASO%: 0.8 % (ref 0.0–2.0)
Basophils Absolute: 0 10*3/uL (ref 0.0–0.1)
EOS ABS: 0.1 10*3/uL (ref 0.0–0.5)
EOS%: 1 % (ref 0.0–7.0)
HCT: 41.1 % (ref 34.8–46.6)
HEMOGLOBIN: 13.3 g/dL (ref 11.6–15.9)
LYMPH#: 2.3 10*3/uL (ref 0.9–3.3)
LYMPH%: 42.1 % (ref 14.0–49.7)
MCH: 29.6 pg (ref 25.1–34.0)
MCHC: 32.4 g/dL (ref 31.5–36.0)
MCV: 91.4 fL (ref 79.5–101.0)
MONO#: 0.4 10*3/uL (ref 0.1–0.9)
MONO%: 7 % (ref 0.0–14.0)
NEUT%: 49.1 % (ref 38.4–76.8)
NEUTROS ABS: 2.7 10*3/uL (ref 1.5–6.5)
Platelets: 199 10*3/uL (ref 145–400)
RBC: 4.5 10*6/uL (ref 3.70–5.45)
RDW: 13.7 % (ref 11.2–14.5)
WBC: 5.5 10*3/uL (ref 3.9–10.3)

## 2016-07-28 MED ORDER — FULVESTRANT 250 MG/5ML IM SOLN
500.0000 mg | Freq: Once | INTRAMUSCULAR | Status: AC
  Administered 2016-07-28: 500 mg via INTRAMUSCULAR
  Filled 2016-07-28: qty 10

## 2016-07-28 MED ORDER — DENOSUMAB 60 MG/ML ~~LOC~~ SOLN
60.0000 mg | Freq: Once | SUBCUTANEOUS | Status: AC
  Administered 2016-07-28: 60 mg via SUBCUTANEOUS
  Filled 2016-07-28: qty 1

## 2016-07-28 NOTE — Progress Notes (Signed)
[place]  [phone] [phone]   ID: Angel [name] DOB: 02-Jul-1932  MR#: [id number]  MWU#:[id number]  Patient Care Team: Ann Held, DO as PCP - General (Family Medicine) [name], MD as Consulting Physician (Oncology) [name], MD as Consulting Physician (Cardiology) OTHER M.D.: Maisie Fus Kurup (ophth)  CHIEF COMPLAINT: Chest wall recurrence of breast cancer  CURRENT TREATMENT: Fulvestrant, Denosumab/Prolia  BREAST CANCER HISTORY: Per Dr. 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 She had a second right breast cancer in 2012, with right mastectomy by Dr [name] 07-29-2011 for T1N0M0 ER/PR positive, HER-2 negative invasive ductal carcinoma; she also had what was planned as prophylactic left mastectomy, with unexpected finding of left DCIS in that pathology. Plan in 09-2011 was to begin tamoxifen, chosen particularly due to low bone density. Patient did not keep follow up visits at this office, stopped tamoxifen due intolerance (tho she does not remember what problem she had with this) and was treated with raloxifene by Dr [name].  She was seen next at this office on 12-26-2013 with an enlarging nodular area above right mastectomy scar,reportedly present for several months. Excisional biopsy done 01-21-14 found grade 2 invasive ductal carcinoma with involvement at [facility]) and lymphovascular space invasion, ER positive 100%, PR positive 75%, proliferation marker 80% and HER-2 negative by [facility] (pathology [phone]). PET 02-15-14 did not identify any involvement beyond right chest wall. She had 4500 cGy as electron beam radiation from 5-27 thru 03-27-14 to right chest wall. By 04-30-14 there were multiple tiny nodules in region of right mastectomy scar."  The patient's subsequent history is as detailed  below  INTERVAL HISTORY: [name] returns today for follow up of her estrogen receptor positive breast cancer. She will receive fulvestrant today. She tolerates this well. She has no side effects from it that she is aware of. She will receive her denosumab/Prolia an month from now.   REVIEW OF SYSTEMS: Angel [name] has a birthday coming up, her 16nd, and she feels "great". She feels she is in the upper 73% of 80 year old's. She does describe herself is moderately fatigued, but that varies with activity. She has limited range of motion in her upper extremities so that getting her hair done now is difficult and there are some chores at home that she just can't do. She needs readers. She has an irregular heartbeat. She uses a cane to help her with balance and walking. Occasionally she is mildly nauseated. She has pain "all the time" in her back and other joints. She takes several pain medicines for this and she manages these well, without significant problems with constipation or other side effects. She admits to anxiety but not depression. She has occasional hot flashes. A detailed review of systems was otherwise stable.  PAST MEDICAL HISTORY: Past Medical History:  Diagnosis Date  . Anxiety   . Asthma   . Breast cancer ([place]) 05/07/1999, 06/2011   a. s/p bilat mastectomies. recurrence 01/2014. Radiation, meds.  . Cancer of chest (wall) ([place])   . Cataract    eye surgery planned for 11/2014  . Chest wall recurrence of breast cancer ([place]) 2016  . Cholelithiasis   . Chronic bronchitis   . COPD (chronic obstructive pulmonary disease) ([place])   . Coronary artery disease Non-obstructive   a. 2012 Cath: LAD 40-89m>Med Rx;  b. 11/2013 Echo: EF 50-55%, nl wall motion;  c. 12/2013 Lexi MV: EF 74%, no ischemia.  . DDD (degenerative disc disease)   . Degenerative joint disease   . Depression   . Dyslipidemia   . GERD (gastroesophageal reflux disease)   . Hx of radiation therapy 07/02/1999- 08/11/1999   right  supraclavucular/axillary region: 5040 cGy, 28 fractions  . Hx of radiation therapy 02/20/14- 03/27/14   right chest wall 4500 cGy 25 sessions  . Hypertension   . Kidney stones    one stones  . Osteoarthritis   . Osteoporosis   . Pre-diabetes   . PVC's (premature ventricular contractions)   . Reflux esophagitis   . Skin cancer of face    non melanoma  . Spinal stenosis     PAST SURGICAL HISTORY: Past Surgical History:  Procedure Laterality Date  . ABDOMINAL HYSTERECTOMY     in her 30's  . APPENDECTOMY    . BREAST BIOPSY Right 01/21/2014   Procedure: BREAST/CHEST WALL BIOPSY;  Surgeon: [name], MD;  Location: [facility];  Service: General;  Laterality: Right;  . BREAST LUMPECTOMY  2002   right breast  . CARDIAC CATHETERIZATION  03/2011, 3/15  . LEFT HEART CATHETERIZATION WITH CORONARY ANGIOGRAM N/A 12/05/2013   Procedure: LEFT HEART CATHETERIZATION WITH CORONARY ANGIOGRAM;  Surgeon: [name], MD;  Location: [facility] CATH LAB;  Service: Cardiovascular;  Laterality: N/A;  . LEFT HEART CATHETERIZATION WITH CORONARY ANGIOGRAM N/A 11/06/2014   Procedure: LEFT HEART CATHETERIZATION WITH CORONARY ANGIOGRAM;  Surgeon: [name], MD;  Location: [facility] CATH LAB;  Service: Cardiovascular;  Laterality: N/A;  . MASTECTOMY  07/29/11   bilateral-rt nodes-none on left  . SMALL INTESTINE SURGERY  1998   SBO  . TONSILLECTOMY    . TUBAL LIGATION  1961  . VEIN LIGATION AND STRIPPING Right   . VESICOVAGINAL FISTULA CLOSURE W/ TAH  age 48    FAMILY HISTORY Family History  Problem Relation Age of Onset  . Pancreatic cancer Mother   . Cancer Mother     pancreatic  . Asthma Brother   . Asthma Daughter   . Stroke Daughter   . Hypertension Daughter   . Asthma Grandchild   . Asthma Brother     deceased  . Heart disease Father   . Emphysema Father   . Heart disease Brother   . Lymphoma Brother    the patient's father died at the age of 39 from heart disease. He was a smoker. The  patient's mother died at the age of 24 with cancer of the pancreas. The patient has 3 brothers, no sisters. There is no history of breast or ovarian cancer in the family to her knowledge.  GYNECOLOGIC HISTORY:  No LMP recorded. Patient has had a hysterectomy. Menarche age 30, first live birth age 72. She is GX P4. She stopped having periods in her 72s. She took hormone replacement for approximately 10 years. 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 She has been "single" for 40 years. Her oldest daughter died from a subarachnoid hemorrhage at age 24, 2 years ago. This is when the patient had undergone treatment for invasive breast cancer and she tells me she was simply overwhelmed and could not start the planned antiestrogen therapy. The next daughter, [name], is a retired Software engineer. She lives in [place]. Next child, Freda Munro, works in Charity fundraiser. She also lives in [place]. The youngest, [name], is retired  from the mechanics position. He also owned his own business. The patient has 7 grandchildren and 2 many great-grandchildren to count. She attends the [employer]    ADVANCED DIRECTIVES: In place; the patient's daughter Freda Munro is her healthcare power of attorney. She can be reached at [place]: Social History  Substance Use Topics  . Smoking status: Never Smoker  . Smokeless tobacco: Never Used  . Alcohol use Yes     Comment: occ wine     Colonoscopy:  PAP:  Bone density: August 2015   Lipid panel:  Allergies  Allergen Reactions  . Adhesive [Tape] Other (See Comments)    Pt prefers to use paper tape  . Penicillins Rash    Has patient had a PCN reaction causing immediate rash, facial/tongue/throat swelling, SOB or lightheadedness with hypotension: Yes Has patient had a PCN reaction causing severe rash involving mucus  membranes or skin necrosis: No Has patient had a PCN reaction that required hospitalization No Has patient had a PCN reaction occurring within the last 10 years: No If all of the above answers are "NO", then may proceed with Cephalosporin use.     Current Outpatient Prescriptions  Medication Sig Dispense Refill  . Artificial Tear Ointment (DRY EYES OP) Apply 2 drops to eye daily as needed (dry eyes).    [name] aspirin EC 81 MG tablet Take 2 tablets (162 mg total) by mouth daily.    [name] atorvastatin (LIPITOR) 20 MG tablet Take 1 tablet (20 mg total) by mouth daily. 90 tablet 3  . B Complex-Biotin-FA (B-COMPLEX PO) Take 1 tablet by mouth daily.     . Biotin 5000 MCG CAPS Take 1 capsule (5,000 mcg total) by mouth daily. 90 capsule 3  . budesonide-formoterol (SYMBICORT) 160-4.5 MCG/ACT inhaler Inhale 2 puffs into the lungs 2 (two) times daily as needed. 3 Inhaler 1  . Calcium-Magnesium-Zinc 1000-400-15 MG TABS Take 1 tablet by mouth daily.      . celecoxib (CELEBREX) 200 MG capsule Take 1 capsule daily with food for pain & inflammation 90 capsule 3  . Cholecalciferol (VITAMIN D-3) 5000 UNITS TABS Take 1 tablet by mouth daily.     . citalopram (CELEXA) 20 MG tablet Take 1 tablet (20 mg total) by mouth daily. 90 tablet 1  . diazepam (VALIUM) 5 MG tablet TAKE ONE-HALF TO ONE TABLET BY MOUTH EVERY 8 HOURS AS NEEDED FOR ANXIETY 45 tablet 0  . DULoxetine (CYMBALTA) 60 MG capsule Take 1 capsule by mouth  every day for mood 90 capsule 1  . ezetimibe (ZETIA) 10 MG tablet Take 1 tablet (10 mg total) by mouth daily.    . Fish Oil OIL Take 1 capsule by mouth daily.     . Flaxseed, Linseed, (GROUND FLAX SEEDS PO) Take 5 mLs by mouth daily.     [name] gabapentin (NEURONTIN) 100 MG capsule 1-2 po nightly 60 capsule 3  . HYDROcodone-acetaminophen (NORCO) 10-325 MG tablet Take 2 tablets by mouth every 4 (four) hours as needed for moderate pain. 60 tablet 0  . isosorbide mononitrate (IMDUR) 30 MG 24 hr tablet Take 1 tablet  (30 mg total) by mouth daily. 90 tablet 3  . montelukast (SINGULAIR) 10 MG tablet Take 1 tablet (10 mg total) by mouth at bedtime. 90 tablet 3  . Multiple Vitamin (MULTIVITAMIN) tablet Take 1 tablet by mouth daily.    . ondansetron (ZOFRAN ODT) 4 MG disintegrating tablet Take 1 tablet (4 mg total) by mouth  every 8 (eight) hours as needed for nausea or vomiting. 20 tablet 0   No current facility-administered medications for this visit.     OBJECTIVE: Elderly white woman Who appears younger than stated age 82:   07/28/16 1558  BP: 123/67  Pulse: 70  Resp: 17  Temp: 97.6 F (36.4 C)     Body mass index is 23.1 kg/m.    ECOG FS:1 - Symptomatic but completely ambulatory  Sclerae unicteric, s/p left eye injury remotely Oropharynx clear and moist-- no thrush or other lesions No cervical or supraclavicular adenopathy Lungs no rales or rhonchi Heart regular rate and rhythm Abd soft, nontender, positive bowel sounds MSK no focal spinal tenderness, no upper extremity lymphedema Neuro: nonfocal, well oriented, appropriate affect Breasts: Status post bilateral mastectomies. There is no evidence of chest wall recurrence. Both axillae are benign.  LAB RESULTS:  CMP     Component Value Date/Time   NA 136 05/03/2016 1141   NA 136 10/29/2015 1127   K 4.2 05/03/2016 1141   K 4.6 10/29/2015 1127   CL 101 05/03/2016 1141   CO2 31 05/03/2016 1141   CO2 27 10/29/2015 1127   GLUCOSE 93 05/03/2016 1141   GLUCOSE 96 10/29/2015 1127   BUN 13 05/03/2016 1141   BUN 16.3 10/29/2015 1127   CREATININE 0.65 05/03/2016 1141   CREATININE 0.8 10/29/2015 1127   CALCIUM 9.2 05/03/2016 1141   CALCIUM 9.5 10/29/2015 1127   PROT 6.7 05/03/2016 1141   PROT 7.3 10/29/2015 1127   ALBUMIN 4.0 05/03/2016 1141   ALBUMIN 4.1 10/29/2015 1127   AST 17 05/03/2016 1141   AST 18 10/29/2015 1127   ALT 10 05/03/2016 1141   ALT 12 10/29/2015 1127   ALKPHOS 31 (L) 05/03/2016 1141   ALKPHOS 40 10/29/2015 1127    BILITOT 0.4 05/03/2016 1141   BILITOT 0.51 10/29/2015 1127   GFRNONAA >60 07/23/2015 1407   GFRNONAA 73 05/01/2014 1050   GFRAA >60 07/23/2015 1407   GFRAA 84 05/01/2014 1050    I No results found for: SPEP  Lab Results  Component Value Date   WBC 5.5 07/28/2016   NEUTROABS 2.7 07/28/2016   HGB 13.3 07/28/2016   HCT 41.1 07/28/2016   MCV 91.4 07/28/2016   PLT 199 07/28/2016      Chemistry      Component Value Date/Time   NA 136 05/03/2016 1141   NA 136 10/29/2015 1127   K 4.2 05/03/2016 1141   K 4.6 10/29/2015 1127   CL 101 05/03/2016 1141   CO2 31 05/03/2016 1141   CO2 27 10/29/2015 1127   BUN 13 05/03/2016 1141   BUN 16.3 10/29/2015 1127   CREATININE 0.65 05/03/2016 1141   CREATININE 0.8 10/29/2015 1127      Component Value Date/Time   CALCIUM 9.2 05/03/2016 1141   CALCIUM 9.5 10/29/2015 1127   ALKPHOS 31 (L) 05/03/2016 1141   ALKPHOS 40 10/29/2015 1127   AST 17 05/03/2016 1141   AST 18 10/29/2015 1127   ALT 10 05/03/2016 1141   ALT 12 10/29/2015 1127   BILITOT 0.4 05/03/2016 1141   BILITOT 0.51 10/29/2015 1127       Lab Results  Component Value Date   LABCA2 12 11/26/2015    No components found for: [id number]  No results for input(s): INR in the last 168 hours.  Urinalysis    Component Value Date/Time   COLORURINE YELLOW 02/14/2015 2010   APPEARANCEUR CLOUDY (A) 02/14/2015 2010  LABSPEC 1.008 02/14/2015 2010   PHURINE 6.5 02/14/2015 2010   GLUCOSEU NEGATIVE 02/14/2015 2010   HGBUR MODERATE (A) 02/14/2015 2010   BILIRUBINUR neg 04/25/2015 1015   KETONESUR NEGATIVE 02/14/2015 2010   PROTEINUR neg 04/25/2015 1015   PROTEINUR NEGATIVE 02/14/2015 2010   UROBILINOGEN 0.2 04/25/2015 1015   UROBILINOGEN 0.2 02/14/2015 2010   NITRITE neg 04/25/2015 1015   NITRITE NEGATIVE 02/14/2015 2010   LEUKOCYTESUR Negative 04/25/2015 1015    STUDIES: No results found.  ASSESSMENT: 82 y.o. [place] woman with locally recurrent breast cancer, as  follows:  (1) status post right lumpectomy and sentinel node sampling September 2000 for ductal carcinoma in situ (Ni+), estrogen and progesterone receptor positive, with a low proliferation index,  (a) received 5040 cGy to the right breast and regional lymph nodes  (b) on tamoxifen less than 3 months  (2) s/p bilateral mastectomies and right axillary lymph node sampling 07/29/2011, showing  (a) in the left breast, ductal carcinoma in situ measuring 1.2 cm, grade 1, with negative margins, estrogen and progesterone receptor positive  (b)  on the right, a pT1c pN0, stage IA invasive ductal carcinoma, estrogen receptor and 93% positive, progesterone receptor 87% positive, with an MIB-1 of 35%, and no HER-2 amplification margins were close but negative  (c) did not receive adjuvant antiestrogen therapy  (3) right chest wall skin recurrence resected 01/21/2014, measuring 1.7 cm, with positive margins, estrogen receptor 100% positive, progesterone receptor 75% positive, with an MIB-1 of 80%, and no HER-2 amplification; PET scan 02/15/2014 negative except for Right chest wall focus  (a) received 45 Gy electron-beam therapy to the right chest wall completed 03/27/2014  (b) skin progression within and without the radiation field noted 04/30/2014  (c) letrozole started 05/13/2014, discontinued March 2016 with progression   (4) dexa scan 05/09/2014 at [facility] shows osteoporosis with a T score of -2.9  (5) Fulvestrant started 12/25/2014  (6) osteopenia, on DEXA scan 05/09/2014  (a) started Denosumab/prolia 08/06/2015, repeated every 24 weeks  (7) Chronic pain: Followed by Dr. Nelva Bush  PLAN: Latyra is now 2-1/2 years out from diagnosis of her recurrent breast cancer, with no evidence of disease activity. This is very favorable.  She tolerates the fulvestrant well and the plan is to continue that until there is evidence of disease progression.  She is receiving denosumab/Prolia for her  significant osteopenia. Her next dose will be at the end of this month.  Otherwise she will have a repeat bone scan in December, together with a chest x-ray. She will see me again in January. I am delighted at how well she is doing. She knows to call for any problems that may develop before her next visit here. [name], MD   07/28/2016 5:31 PM a

## 2016-08-02 ENCOUNTER — Telehealth: Payer: Self-pay | Admitting: Oncology

## 2016-08-02 NOTE — Telephone Encounter (Signed)
Returned call to patient in regards to her next scheduled appointment. LVM

## 2016-08-04 ENCOUNTER — Ambulatory Visit: Payer: Medicare Other | Admitting: Oncology

## 2016-08-04 ENCOUNTER — Ambulatory Visit: Payer: Medicare Other

## 2016-08-04 ENCOUNTER — Other Ambulatory Visit: Payer: Medicare Other

## 2016-08-16 ENCOUNTER — Other Ambulatory Visit: Payer: Self-pay | Admitting: Oncology

## 2016-08-16 ENCOUNTER — Other Ambulatory Visit: Payer: Medicare Other

## 2016-08-16 ENCOUNTER — Ambulatory Visit: Payer: Medicare Other | Admitting: Oncology

## 2016-08-21 DIAGNOSIS — J45909 Unspecified asthma, uncomplicated: Secondary | ICD-10-CM | POA: Diagnosis not present

## 2016-08-25 ENCOUNTER — Other Ambulatory Visit: Payer: Self-pay | Admitting: Oncology

## 2016-08-25 ENCOUNTER — Other Ambulatory Visit (HOSPITAL_BASED_OUTPATIENT_CLINIC_OR_DEPARTMENT_OTHER): Payer: Medicare Other

## 2016-08-25 ENCOUNTER — Ambulatory Visit (HOSPITAL_BASED_OUTPATIENT_CLINIC_OR_DEPARTMENT_OTHER): Payer: Medicare Other

## 2016-08-25 VITALS — BP 131/60 | HR 73 | Temp 97.0°F | Resp 18

## 2016-08-25 DIAGNOSIS — Z5111 Encounter for antineoplastic chemotherapy: Secondary | ICD-10-CM | POA: Diagnosis not present

## 2016-08-25 DIAGNOSIS — Z170 Estrogen receptor positive status [ER+]: Secondary | ICD-10-CM

## 2016-08-25 DIAGNOSIS — C7989 Secondary malignant neoplasm of other specified sites: Secondary | ICD-10-CM

## 2016-08-25 DIAGNOSIS — C50911 Malignant neoplasm of unspecified site of right female breast: Secondary | ICD-10-CM

## 2016-08-25 DIAGNOSIS — M818 Other osteoporosis without current pathological fracture: Secondary | ICD-10-CM

## 2016-08-25 LAB — CBC WITH DIFFERENTIAL/PLATELET
BASO%: 0.4 % (ref 0.0–2.0)
Basophils Absolute: 0 10*3/uL (ref 0.0–0.1)
EOS%: 1.1 % (ref 0.0–7.0)
Eosinophils Absolute: 0.1 10*3/uL (ref 0.0–0.5)
HCT: 39.6 % (ref 34.8–46.6)
HGB: 13 g/dL (ref 11.6–15.9)
LYMPH%: 22.6 % (ref 14.0–49.7)
MCH: 29.9 pg (ref 25.1–34.0)
MCHC: 32.8 g/dL (ref 31.5–36.0)
MCV: 91.2 fL (ref 79.5–101.0)
MONO#: 0.4 10*3/uL (ref 0.1–0.9)
MONO%: 5.8 % (ref 0.0–14.0)
NEUT%: 70.1 % (ref 38.4–76.8)
NEUTROS ABS: 5.3 10*3/uL (ref 1.5–6.5)
Platelets: 195 10*3/uL (ref 145–400)
RBC: 4.34 10*6/uL (ref 3.70–5.45)
RDW: 13.3 % (ref 11.2–14.5)
WBC: 7.6 10*3/uL (ref 3.9–10.3)
lymph#: 1.7 10*3/uL (ref 0.9–3.3)

## 2016-08-25 LAB — COMPREHENSIVE METABOLIC PANEL
ALT: 11 U/L (ref 0–55)
AST: 19 U/L (ref 5–34)
Albumin: 3.7 g/dL (ref 3.5–5.0)
Alkaline Phosphatase: 46 U/L (ref 40–150)
Anion Gap: 9 mEq/L (ref 3–11)
BILIRUBIN TOTAL: 0.4 mg/dL (ref 0.20–1.20)
BUN: 14 mg/dL (ref 7.0–26.0)
CHLORIDE: 101 meq/L (ref 98–109)
CO2: 26 meq/L (ref 22–29)
Calcium: 9.3 mg/dL (ref 8.4–10.4)
Creatinine: 0.7 mg/dL (ref 0.6–1.1)
EGFR: 75 mL/min/{1.73_m2} — AB (ref >90)
GLUCOSE: 89 mg/dL (ref 70–140)
Potassium: 4.6 mEq/L (ref 3.5–5.1)
SODIUM: 137 meq/L (ref 136–145)
TOTAL PROTEIN: 6.6 g/dL (ref 6.4–8.3)

## 2016-08-25 MED ORDER — FULVESTRANT 250 MG/5ML IM SOLN
500.0000 mg | Freq: Once | INTRAMUSCULAR | Status: AC
  Administered 2016-08-25: 500 mg via INTRAMUSCULAR
  Filled 2016-08-25: qty 10

## 2016-08-25 MED ORDER — DENOSUMAB 60 MG/ML ~~LOC~~ SOLN: 60.0000 mg | Freq: Once | SUBCUTANEOUS | Status: DC

## 2016-08-25 NOTE — Patient Instructions (Signed)

## 2016-08-27 ENCOUNTER — Telehealth: Payer: Self-pay | Admitting: Family Medicine

## 2016-08-27 DIAGNOSIS — F32A Depression, unspecified: Secondary | ICD-10-CM

## 2016-08-27 DIAGNOSIS — F329 Major depressive disorder, single episode, unspecified: Secondary | ICD-10-CM

## 2016-08-27 MED ORDER — DULOXETINE HCL 60 MG PO CPEP: ORAL_CAPSULE | ORAL | 0 refills | Status: DC

## 2016-08-27 NOTE — Telephone Encounter (Signed)
Relation to [id number] Call back number:[phone] Pharmacy: [place], [place]. [phone] (Phone) [phone] (Fax)     Reason for call:  Patient requesting a refill DULoxetine (CYMBALTA) 60 MG capsule, patient apologizes she didn't realize she was out today, please advise

## 2016-08-27 NOTE — Telephone Encounter (Signed)
Medication filled to pharmacy as requested.   

## 2016-09-20 DIAGNOSIS — J45909 Unspecified asthma, uncomplicated: Secondary | ICD-10-CM | POA: Diagnosis not present

## 2016-09-22 ENCOUNTER — Ambulatory Visit (HOSPITAL_BASED_OUTPATIENT_CLINIC_OR_DEPARTMENT_OTHER): Payer: Medicare Other

## 2016-09-22 ENCOUNTER — Other Ambulatory Visit (HOSPITAL_BASED_OUTPATIENT_CLINIC_OR_DEPARTMENT_OTHER): Payer: Medicare Other

## 2016-09-22 ENCOUNTER — Other Ambulatory Visit: Payer: Self-pay | Admitting: Oncology

## 2016-09-22 VITALS — BP 119/68 | HR 67 | Temp 97.5°F | Resp 18

## 2016-09-22 DIAGNOSIS — C7989 Secondary malignant neoplasm of other specified sites: Secondary | ICD-10-CM

## 2016-09-22 DIAGNOSIS — Z5111 Encounter for antineoplastic chemotherapy: Secondary | ICD-10-CM

## 2016-09-22 DIAGNOSIS — C50911 Malignant neoplasm of unspecified site of right female breast: Secondary | ICD-10-CM

## 2016-09-22 DIAGNOSIS — M818 Other osteoporosis without current pathological fracture: Secondary | ICD-10-CM

## 2016-09-22 LAB — COMPREHENSIVE METABOLIC PANEL
ALBUMIN: 3.9 g/dL (ref 3.5–5.0)
ALK PHOS: 42 U/L (ref 40–150)
ALT: 10 U/L (ref 0–55)
ANION GAP: 7 meq/L (ref 3–11)
AST: 19 U/L (ref 5–34)
BUN: 16.8 mg/dL (ref 7.0–26.0)
CALCIUM: 9.8 mg/dL (ref 8.4–10.4)
CHLORIDE: 99 meq/L (ref 98–109)
CO2: 28 mEq/L (ref 22–29)
CREATININE: 0.8 mg/dL (ref 0.6–1.1)
EGFR: 65 mL/min/{1.73_m2} — ABNORMAL LOW (ref >90)
Glucose: 87 mg/dl (ref 70–140)
POTASSIUM: 5 meq/L (ref 3.5–5.1)
Sodium: 134 mEq/L — ABNORMAL LOW (ref 136–145)
Total Bilirubin: 0.4 mg/dL (ref 0.20–1.20)
Total Protein: 6.9 g/dL (ref 6.4–8.3)

## 2016-09-22 LAB — CBC WITH DIFFERENTIAL/PLATELET
BASO%: 0.5 % (ref 0.0–2.0)
BASOS ABS: 0 10*3/uL (ref 0.0–0.1)
EOS ABS: 0.1 10*3/uL (ref 0.0–0.5)
EOS%: 1.7 % (ref 0.0–7.0)
HEMATOCRIT: 40.1 % (ref 34.8–46.6)
HEMOGLOBIN: 13.2 g/dL (ref 11.6–15.9)
LYMPH%: 50.6 % — ABNORMAL HIGH (ref 14.0–49.7)
MCH: 30.1 pg (ref 25.1–34.0)
MCHC: 32.9 g/dL (ref 31.5–36.0)
MCV: 91.6 fL (ref 79.5–101.0)
MONO#: 0.5 10*3/uL (ref 0.1–0.9)
MONO%: 7.1 % (ref 0.0–14.0)
NEUT%: 40.1 % (ref 38.4–76.8)
NEUTROS ABS: 2.5 10*3/uL (ref 1.5–6.5)
PLATELETS: 227 10*3/uL (ref 145–400)
RBC: 4.38 10*6/uL (ref 3.70–5.45)
RDW: 13.1 % (ref 11.2–14.5)
WBC: 6.3 10*3/uL (ref 3.9–10.3)
lymph#: 3.2 10*3/uL (ref 0.9–3.3)

## 2016-09-22 MED ORDER — FULVESTRANT 250 MG/5ML IM SOLN
500.0000 mg | Freq: Once | INTRAMUSCULAR | Status: AC
  Administered 2016-09-22: 500 mg via INTRAMUSCULAR
  Filled 2016-09-22: qty 10

## 2016-09-22 NOTE — Telephone Encounter (Signed)
Valium refilled 

## 2016-09-22 NOTE — Patient Instructions (Signed)

## 2016-09-23 ENCOUNTER — Encounter: Payer: Self-pay | Admitting: Oncology

## 2016-09-23 NOTE — Progress Notes (Signed)
Met w/ pt regarding copay assistance.  Applied to the [employer] in pt's behalf.  She was approved for $5,000 for 12 months from 09/23/16; $1,650 is her standard award, $3,350 is accessible on a [employer] basis as long as funding remains available.  Sent copy of approval letter to Darrell Jewel and Elmyra Ricks in billing, to HIM to be scanned in pt's chart and to [facility] to file in her book.

## 2016-09-24 ENCOUNTER — Encounter (HOSPITAL_COMMUNITY): Payer: Medicare Other

## 2016-09-29 ENCOUNTER — Emergency Department (HOSPITAL_COMMUNITY)
Admission: EM | Admit: 2016-09-29 | Discharge: 2016-09-29 | Disposition: A | Discharge status: Home / Self Care | Payer: Medicare Other | Attending: Emergency Medicine | Admitting: Emergency Medicine

## 2016-09-29 ENCOUNTER — Encounter (HOSPITAL_COMMUNITY): Payer: Self-pay

## 2016-09-29 ENCOUNTER — Emergency Department (HOSPITAL_COMMUNITY): Payer: Medicare Other

## 2016-09-29 ENCOUNTER — Other Ambulatory Visit: Payer: Self-pay

## 2016-09-29 DIAGNOSIS — Z7982 Long term (current) use of aspirin: Secondary | ICD-10-CM | POA: Insufficient documentation

## 2016-09-29 DIAGNOSIS — Z79899 Other long term (current) drug therapy: Secondary | ICD-10-CM | POA: Diagnosis not present

## 2016-09-29 DIAGNOSIS — J449 Chronic obstructive pulmonary disease, unspecified: Secondary | ICD-10-CM | POA: Insufficient documentation

## 2016-09-29 DIAGNOSIS — Z85828 Personal history of other malignant neoplasm of skin: Secondary | ICD-10-CM | POA: Insufficient documentation

## 2016-09-29 DIAGNOSIS — J069 Acute upper respiratory infection, unspecified: Secondary | ICD-10-CM | POA: Insufficient documentation

## 2016-09-29 DIAGNOSIS — R05 Cough: Secondary | ICD-10-CM | POA: Diagnosis not present

## 2016-09-29 DIAGNOSIS — I10 Essential (primary) hypertension: Secondary | ICD-10-CM | POA: Insufficient documentation

## 2016-09-29 DIAGNOSIS — I2510 Atherosclerotic heart disease of native coronary artery without angina pectoris: Secondary | ICD-10-CM | POA: Diagnosis not present

## 2016-09-29 DIAGNOSIS — R0789 Other chest pain: Secondary | ICD-10-CM | POA: Diagnosis not present

## 2016-09-29 DIAGNOSIS — R079 Chest pain, unspecified: Secondary | ICD-10-CM | POA: Diagnosis not present

## 2016-09-29 DIAGNOSIS — R0602 Shortness of breath: Secondary | ICD-10-CM | POA: Diagnosis not present

## 2016-09-29 DIAGNOSIS — Z853 Personal history of malignant neoplasm of breast: Secondary | ICD-10-CM | POA: Insufficient documentation

## 2016-09-29 LAB — COMPREHENSIVE METABOLIC PANEL
ALT: 14 U/L (ref 14–54)
ANION GAP: 9 (ref 5–15)
AST: 21 U/L (ref 15–41)
Albumin: 4.1 g/dL (ref 3.5–5.0)
Alkaline Phosphatase: 43 U/L (ref 38–126)
BUN: 11 mg/dL (ref 6–20)
CHLORIDE: 102 mmol/L (ref 101–111)
CO2: 23 mmol/L (ref 22–32)
CREATININE: 0.69 mg/dL (ref 0.44–1.00)
Calcium: 9.2 mg/dL (ref 8.9–10.3)
GFR calc non Af Amer: >60 mL/min (ref >60)
Glucose, Bld: 106 mg/dL — ABNORMAL HIGH (ref 65–99)
POTASSIUM: 4.2 mmol/L (ref 3.5–5.1)
SODIUM: 134 mmol/L — AB (ref 135–145)
Total Bilirubin: 0.6 mg/dL (ref 0.3–1.2)
Total Protein: 6.7 g/dL (ref 6.5–8.1)

## 2016-09-29 LAB — URINALYSIS, ROUTINE W REFLEX MICROSCOPIC
BILIRUBIN URINE: NEGATIVE
Glucose, UA: NEGATIVE mg/dL
Hgb urine dipstick: NEGATIVE
KETONES UR: NEGATIVE mg/dL
Leukocytes, UA: NEGATIVE
NITRITE: NEGATIVE
Protein, ur: NEGATIVE mg/dL
SPECIFIC GRAVITY, URINE: 1.011 (ref 1.005–1.030)
pH: 6 (ref 5.0–8.0)

## 2016-09-29 LAB — I-STAT CG4 LACTIC ACID, ED
LACTIC ACID, VENOUS: 0.79 mmol/L (ref 0.5–1.9)
Lactic Acid, Venous: 0.53 mmol/L (ref 0.5–1.9)

## 2016-09-29 LAB — CBC WITH DIFFERENTIAL/PLATELET
BASOS ABS: 0 10*3/uL (ref 0.0–0.1)
Basophils Relative: 1 %
EOS PCT: 2 %
Eosinophils Absolute: 0.1 10*3/uL (ref 0.0–0.7)
HCT: 39.2 % (ref 36.0–46.0)
HEMOGLOBIN: 13 g/dL (ref 12.0–15.0)
LYMPHS PCT: 21 %
Lymphs Abs: 1.2 10*3/uL (ref 0.7–4.0)
MCH: 29.8 pg (ref 26.0–34.0)
MCHC: 33.2 g/dL (ref 30.0–36.0)
MCV: 89.9 fL (ref 78.0–100.0)
Monocytes Absolute: 0.4 10*3/uL (ref 0.1–1.0)
Monocytes Relative: 7 %
NEUTROS ABS: 4.1 10*3/uL (ref 1.7–7.7)
NEUTROS PCT: 69 %
PLATELETS: 185 10*3/uL (ref 150–400)
RBC: 4.36 MIL/uL (ref 3.87–5.11)
RDW: 13 % (ref 11.5–15.5)
WBC: 5.8 10*3/uL (ref 4.0–10.5)

## 2016-09-29 LAB — BRAIN NATRIURETIC PEPTIDE: B Natriuretic Peptide: 101 pg/mL — ABNORMAL HIGH (ref 0.0–100.0)

## 2016-09-29 LAB — I-STAT TROPONIN, ED: Troponin i, poc: 0 ng/mL (ref 0.00–0.08)

## 2016-09-29 LAB — INFLUENZA PANEL BY PCR (TYPE A & B)
INFLAPCR: NEGATIVE
INFLBPCR: NEGATIVE

## 2016-09-29 LAB — PROTIME-INR
INR: 0.96
PROTHROMBIN TIME: 12.7 s (ref 11.4–15.2)

## 2016-09-29 MED ORDER — MORPHINE SULFATE (PF) 4 MG/ML IV SOLN
4.0000 mg | Freq: Once | INTRAVENOUS | Status: AC
  Administered 2016-09-29: 4 mg via INTRAVENOUS
  Filled 2016-09-29: qty 1

## 2016-09-29 MED ORDER — HYDROCODONE-ACETAMINOPHEN 5-325 MG PO TABS: ORAL_TABLET | ORAL | 0 refills | Status: DC

## 2016-09-29 MED ORDER — ONDANSETRON HCL 4 MG/2ML IJ SOLN
4.0000 mg | Freq: Once | INTRAMUSCULAR | Status: AC
  Administered 2016-09-29: 4 mg via INTRAVENOUS
  Filled 2016-09-29: qty 2

## 2016-09-29 MED ORDER — IOPAMIDOL (ISOVUE-370) INJECTION 76%
100.0000 mL | Freq: Once | INTRAVENOUS | Status: AC | PRN
  Administered 2016-09-29: 100 mL via INTRAVENOUS

## 2016-09-29 MED ORDER — PREDNISONE 50 MG PO TABS: ORAL_TABLET | ORAL | 0 refills | Status: DC

## 2016-09-29 MED ORDER — IOPAMIDOL (ISOVUE-370) INJECTION 76%
INTRAVENOUS | Status: AC
  Filled 2016-09-29: qty 100

## 2016-09-29 MED ORDER — SODIUM CHLORIDE 0.9 % IV BOLUS (SEPSIS)
500.0000 mL | Freq: Once | INTRAVENOUS | Status: AC
  Administered 2016-09-29: 500 mL via INTRAVENOUS

## 2016-09-29 MED ORDER — ALBUTEROL SULFATE HFA 108 (90 BASE) MCG/ACT IN AERS: 1.0000 | INHALATION_SPRAY | RESPIRATORY_TRACT | 0 refills | Status: DC | PRN

## 2016-09-29 MED ORDER — ALBUTEROL SULFATE (2.5 MG/3ML) 0.083% IN NEBU
5.0000 mg | INHALATION_SOLUTION | Freq: Once | RESPIRATORY_TRACT | Status: AC
  Administered 2016-09-29: 5 mg via RESPIRATORY_TRACT
  Filled 2016-09-29: qty 6

## 2016-09-29 NOTE — ED Provider Notes (Signed)
[facility] DEPT Provider Note   CSN: [id number] Arrival date & time: 09/29/16  1153     History   Chief Complaint Chief Complaint  Patient presents with  . CA Pt  . URI  . Chest Pain     HPI   Blood pressure 104/69, pulse 77, temperature 98.1 F (36.7 C), temperature source Oral, resp. rate 15, height 5\' 2"  (1.575 m), weight 55.3 kg, SpO2 92 %.  Angel [name] is a 83 y.o. female with past medical history significant for metastatic breast cancer complaining of rhinorrhea, dry cough, shortness of breath, atypical chest pain worsening over the course of the last several days. She did get her flu shot this year. She denies fevers, chills, nausea, vomiting states she is eating and drinking slightly less than normal. She states that the chest pain is generalized and feels like a tightness. She denies abdominal pain or change in bowel or bladder habits and sick contacts.  Past Medical History:  Diagnosis Date  . Anxiety   . Asthma   . Breast cancer ([place]) 05/07/1999, 06/2011   a. s/p bilat mastectomies. recurrence 01/2014. Radiation, meds.  . Cancer of chest (wall) ([place])   . Cataract    eye surgery planned for 11/2014  . Chest wall recurrence of breast cancer ([place]) 2016  . Cholelithiasis   . Chronic bronchitis   . COPD (chronic obstructive pulmonary disease) ([place])   . Coronary artery disease Non-obstructive   a. 2012 Cath: LAD 40-27m->Med Rx;  b. 11/2013 Echo: EF 50-55%, nl wall motion;  c. 12/2013 Lexi MV: EF 74%, no ischemia.  . DDD (degenerative disc disease)   . Degenerative joint disease   . Depression   . Dyslipidemia   . GERD (gastroesophageal reflux disease)   . Hx of radiation therapy 07/02/1999- 08/11/1999   right supraclavucular/axillary region: 5040 cGy, 28 fractions  . Hx of radiation therapy 02/20/14- 03/27/14   right chest wall 4500 cGy 25 sessions  . Hypertension   . Kidney stones    one stones  . Osteoarthritis   . Osteoporosis   . Pre-diabetes   .  PVC's (premature ventricular contractions)   . Reflux esophagitis   . Skin cancer of face    non melanoma  . Spinal stenosis     Patient Active Problem List   Diagnosis Date Noted  . Osteoporosis 02/18/2016  . Corneal scar, left eye 12/31/2015  . Frequent falls 08/02/2015  . DOE (dyspnea on exertion) 08/02/2015  . Anxiety and depression 07/23/2015  . Anemia of chronic disease 07/23/2015  . Pain in the chest   . Nuclear sclerosis of left eye 07/17/2015  . Cystoid macular edema of right eye 11/14/2014  . Chest pain 11/01/2014  . Dyslipidemia   . Recurrent cancer of right breast ([place]) 05/29/2014  . CAD (coronary artery disease) 05/23/2014  . Essential hypertension 05/01/2014  . Epiretinal membrane, right eye 08/22/2013  . Lamellar macular hole of right eye 08/22/2013  . Chronic pain 02/11/2011  . Asthma     Past Surgical History:  Procedure Laterality Date  . ABDOMINAL HYSTERECTOMY     in her 30's  . APPENDECTOMY    . BREAST BIOPSY Right 01/21/2014   Procedure: BREAST/CHEST WALL BIOPSY;  Surgeon: [name], MD;  Location: [facility];  Service: General;  Laterality: Right;  . BREAST LUMPECTOMY  2002   right breast  . CARDIAC CATHETERIZATION  03/2011, 3/15  . LEFT HEART CATHETERIZATION WITH CORONARY ANGIOGRAM N/A 12/05/2013  Procedure: LEFT HEART CATHETERIZATION WITH CORONARY ANGIOGRAM;  Surgeon: [name], MD;  Location: [facility] CATH LAB;  Service: Cardiovascular;  Laterality: N/A;  . LEFT HEART CATHETERIZATION WITH CORONARY ANGIOGRAM N/A 11/06/2014   Procedure: LEFT HEART CATHETERIZATION WITH CORONARY ANGIOGRAM;  Surgeon: [name], MD;  Location: [facility] CATH LAB;  Service: Cardiovascular;  Laterality: N/A;  . MASTECTOMY  07/29/11   bilateral-rt nodes-none on left  . SMALL INTESTINE SURGERY  1998   SBO  . TONSILLECTOMY    . TUBAL LIGATION  1961  . VEIN LIGATION AND STRIPPING Right   . VESICOVAGINAL FISTULA CLOSURE W/ TAH  age 47    OB History    No data  available       Home Medications    Prior to Admission medications   Medication Sig Start Date End Date Taking? Authorizing Provider  Artificial Tear Ointment (DRY EYES OP) Place 2 drops into both eyes daily as needed (dry eyes).    Yes Historical Provider, MD  aspirin EC 81 MG tablet Take 2 tablets (162 mg total) by mouth daily. 05/14/15  Yes [name], MD  atorvastatin (LIPITOR) 20 MG tablet Take 1 tablet (20 mg total) by mouth daily. 05/17/16 09/29/16 Yes [name], MD  B Complex-Biotin-FA (B-COMPLEX PO) Take 1 tablet by mouth daily.    Yes Historical Provider, MD  Biotin 5000 MCG CAPS Take 1 capsule (5,000 mcg total) by mouth daily. 05/03/16  Yes Yvonne R Lowne Chase, DO  budesonide-formoterol (SYMBICORT) 160-4.5 MCG/ACT inhaler Inhale 2 puffs into the lungs 2 (two) times daily as needed. Patient taking differently: Inhale 2 puffs into the lungs 2 (two) times daily as needed (wheezing, shortness of breath).  05/03/16  Yes Yvonne R Lowne Chase, DO  Calcium-Magnesium-Zinc 1000-400-15 MG TABS Take 1 tablet by mouth daily.     Yes Historical Provider, MD  celecoxib (CELEBREX) 200 MG capsule Take 1 capsule daily with food for pain & inflammation Patient taking differently: Take 200 mg by mouth daily.  05/03/16  Yes Yvonne R Lowne Chase, DO  Cholecalciferol (VITAMIN D-3) 5000 UNITS TABS Take 1 tablet by mouth daily.    Yes Historical Provider, MD  citalopram (CELEXA) 20 MG tablet Take 1 tablet (20 mg total) by mouth daily. 05/03/16  Yes Yvonne R Lowne Chase, DO  diazepam (VALIUM) 5 MG tablet TAKE ONE-HALF TO ONE TABLET BY MOUTH EVERY 8 HOURS AS NEEDED FOR ANXIETY 09/22/16  Yes [name], MD  DULoxetine (CYMBALTA) 60 MG capsule Take 1 capsule by mouth  every day for mood 08/27/16  Yes Yvonne R Lowne Chase, DO  ezetimibe (ZETIA) 10 MG tablet Take 1 tablet (10 mg total) by mouth daily. 04/25/15  Yes Yvonne R Lowne Chase, DO  Fish Oil OIL Take 1 capsule by mouth daily.    Yes Historical  Provider, MD  Flaxseed, Linseed, (GROUND FLAX SEEDS PO) Take 5 mLs by mouth daily.    Yes Historical Provider, MD  gabapentin (NEURONTIN) 100 MG capsule 1-2 po nightly Patient taking differently: Take 100-200 mg by mouth at bedtime as needed (pain). 1-2 po nightly 05/12/16  Yes [name], MD  isosorbide mononitrate (IMDUR) 30 MG 24 hr tablet Take 1 tablet (30 mg total) by mouth daily. 05/03/16  Yes Yvonne R Lowne Chase, DO  montelukast (SINGULAIR) 10 MG tablet Take 1 tablet (10 mg total) by mouth at bedtime. 05/03/16  Yes [name] Chase, DO  Multiple Vitamin (MULTIVITAMIN) tablet Take  1 tablet by mouth daily.   Yes Historical Provider, MD  ondansetron (ZOFRAN ODT) 4 MG disintegrating tablet Take 1 tablet (4 mg total) by mouth every 8 (eight) hours as needed for nausea or vomiting. 03/17/16  Yes [name], MD  oxyCODONE-acetaminophen (PERCOCET/ROXICET) 5-325 MG tablet Take 1 tablet by mouth daily as needed for pain. 09/12/16  Yes Historical Provider, MD  albuterol (PROVENTIL HFA;VENTOLIN HFA) 108 (90 Base) MCG/ACT inhaler Inhale 1-2 puffs into the lungs every 4 (four) hours as needed for wheezing or shortness of breath. 09/29/16   Beatrice Ziehm, PA-C  HYDROcodone-acetaminophen (NORCO/VICODIN) 5-325 MG tablet Take 1-2 tablets by mouth every 6 hours as needed for pain and/or cough. 09/29/16   [place]aklen Thiam, PA-C  predniSONE (DELTASONE) 50 MG tablet Take 1 tablet daily with breakfast 09/29/16   Monico Blitz, PA-C    Family History Family History  Problem Relation Age of Onset  . Pancreatic cancer Mother   . Cancer Mother     pancreatic  . Asthma Daughter   . Stroke Daughter   . Hypertension Daughter   . Heart disease Father   . Emphysema Father   . Lymphoma Brother   . Asthma Brother   . Asthma Grandchild   . Asthma Brother     deceased  . Heart disease Brother     Social History Social History  Substance Use Topics  . Smoking status: Never Smoker  . Smokeless  tobacco: Never Used  . Alcohol use Yes     Comment: occ wine     Allergies   Adhesive [tape] and Penicillins   Review of Systems Review of Systems  10 systems reviewed and found to be negative, except as noted in the HPI.   Physical Exam Updated Vital Signs BP 100/60 (BP Location: Left Arm)   Pulse 78   Temp 98.1 F (36.7 C) (Oral)   Resp 22   Ht 5\' 2"  (1.575 m)   Wt 55.3 kg   SpO2 93%   BMI 22.31 kg/m   Physical Exam  Constitutional: She is oriented to person, place, and time. She appears well-developed and well-nourished. No distress.  HENT:  Head: Normocephalic and atraumatic.  Dry mucous membranes  Eyes: Conjunctivae and EOM are normal. Pupils are equal, round, and reactive to light.  Neck: Normal range of motion.  Cardiovascular: Normal rate, regular rhythm and intact distal pulses.   Pulmonary/Chest: Effort normal and breath sounds normal.  Bilateral mastectomy  Abdominal: Soft. There is no tenderness.  Musculoskeletal: Normal range of motion.  Neurological: She is alert and oriented to person, place, and time.  Skin: She is not diaphoretic.  Psychiatric: She has a normal mood and affect.  Nursing note and vitals reviewed.    ED Treatments / Results  Labs (all labs ordered are listed, but only abnormal results are displayed) Labs Reviewed  COMPREHENSIVE METABOLIC PANEL - Abnormal; Notable for the following:       Result Value   Sodium 134 (*)    Glucose, Bld 106 (*)    All other components within normal limits  BRAIN NATRIURETIC PEPTIDE - Abnormal; Notable for the following:    B Natriuretic Peptide 101.0 (*)    All other components within normal limits  CULTURE, BLOOD (ROUTINE X 2)  CULTURE, BLOOD (ROUTINE X 2)  URINE CULTURE  CBC WITH DIFFERENTIAL/PLATELET  PROTIME-INR  URINALYSIS, ROUTINE W REFLEX MICROSCOPIC  INFLUENZA PANEL BY PCR (TYPE A & B, H1N1)  I-STAT CG4 LACTIC ACID, ED  Randolm Idol, ED  I-STAT CG4 LACTIC ACID, ED  Randolm Idol, ED    EKG  EKG Interpretation  Date/Time:  Wednesday September 29 2016 12:07:37 EST Ventricular Rate:  106 PR Interval:    QRS Duration: 65 QT Interval:  335 QTC Calculation: 343 R Axis:   40 Text Interpretation:  Sinus tachycardia Ventricular bigeminy ni stemi Confirmed by LONG MD, JOSHUA [phone]) on 09/29/2016 12:14:30 PM       Radiology Dg Chest 2 View  Result Date: 09/29/2016 CLINICAL DATA:  Initial evaluation for acute cough and congestion for 2 days. History of asthma. EXAM: CHEST  2 VIEW COMPARISON:  Prior radiograph from 07/23/2015. FINDINGS: Cardiac and mediastinal silhouettes are stable in size and contour, and remain within normal limits. Aortic atherosclerosis. Elevation of the right hemidiaphragm, similar to previous, and likely chronic. Subtly increased peribronchial thickening as compared to previous, which may reflect acute bronchiolitis and/ or changes related asthma. No consolidative airspace disease. No pulmonary edema or pleural effusion. No pneumothorax. No acute osseous abnormality.  Osteopenia noted. IMPRESSION: 1. Subtle diffuse peribronchial thickening, which may reflect acute bronchiolitis and/or changes related to asthma. No consolidative opacity to suggest pneumonia. 2. Chronic elevation of the right hemidiaphragm, stable. 3. Aortic atherosclerosis. Electronically Signed   By: Jeannine Boga M.D.   On: 09/29/2016 14:25   Ct Angio Chest Pe W And/or Wo Contrast  Result Date: 09/29/2016 CLINICAL DATA:  Chest pain and shortness of breath ; history hypertension, coronary artery disease, breast cancer post BILATERAL mastectomy with chest wall recurrence and radiation therapy, COPD, asthma EXAM: CT ANGIOGRAPHY CHEST WITH CONTRAST TECHNIQUE: Multidetector CT imaging of the chest was performed using the standard protocol during bolus administration of intravenous contrast. Multiplanar CT image reconstructions and MIPs were obtained to evaluate the vascular  anatomy. CONTRAST:  100 cc Isovue 370 IV COMPARISON:  07/23/2015 FINDINGS: Cardiovascular: Atherosclerotic calcifications aorta without aneurysm or dissection. Scattered coronary arterial calcification. No pericardial effusion. Pulmonary arteries adequately opacified and patent. No evidence of pulmonary embolism. Mediastinum/Nodes: Esophagus is partially air filled throughout its length. Cannot exclude mild esophageal wall thickening though this could be an artifact from incomplete distention. Base of cervical region unremarkable. No definite thoracic adenopathy. Surgical clips at the axilla bilaterally. Post BILATERAL mastectomy. Lungs/Pleura: Post radiation therapy changes at the anterolateral RIGHT upper lobe peripherally. Minimal dependent atelectasis RIGHT lower lobe. No definite pulmonary infiltrate, pleural effusion or pneumothorax. Upper Abdomen: Calcified gallstone within gallbladder at least 13 mm diameter, image 89. Remaining visualized upper abdomen unremarkable. Musculoskeletal: Superior endplate concavity at T9 unchanged. No acute osseous findings. Review of the MIP images confirms the above findings. IMPRESSION: No evidence of pulmonary embolism. Aortic atherosclerosis. Coronary arterial calcification. Cholelithiasis. Minimal post radiation therapy change peripherally in anterolateral RIGHT upper lobe. Electronically Signed   By: Lavonia Dana M.D.   On: 09/29/2016 14:50    Procedures Procedures (including critical care time)  Medications Ordered in ED Medications  iopamidol (ISOVUE-370) 76 % injection (not administered)  morphine 4 MG/ML injection 4 mg (4 mg Intravenous Given 09/29/16 1409)  ondansetron (ZOFRAN) injection 4 mg (4 mg Intravenous Given 09/29/16 1410)  albuterol (PROVENTIL) (2.5 MG/3ML) 0.083% nebulizer solution 5 mg (5 mg Nebulization Given 09/29/16 1549)  sodium chloride 0.9 % bolus 500 mL (0 mLs Intravenous Stopped 09/29/16 1417)  iopamidol (ISOVUE-370) 76 % injection 100 mL (100  mLs Intravenous Contrast Given 09/29/16 1424)  morphine 4 MG/ML injection 4 mg (4 mg Intravenous Given 09/29/16 1509)  Initial Impression / Assessment and Plan / ED Course  I have reviewed the triage vital signs and the nursing notes.  Pertinent labs & imaging results that were available during my care of the patient were reviewed by me and considered in my medical decision making (see chart for details).  Clinical Course     Vitals:   09/29/16 1445 09/29/16 1518 09/29/16 1530 09/29/16 1551  BP: 109/61 98/62 100/60   Pulse: 76 73 78   Resp: 14 14 22    Temp:      TempSrc:      SpO2: 93% 98% 98% 93%  Weight:      Height:        Medications  iopamidol (ISOVUE-370) 76 % injection (not administered)  morphine 4 MG/ML injection 4 mg (4 mg Intravenous Given 09/29/16 1409)  ondansetron (ZOFRAN) injection 4 mg (4 mg Intravenous Given 09/29/16 1410)  albuterol (PROVENTIL) (2.5 MG/3ML) 0.083% nebulizer solution 5 mg (5 mg Nebulization Given 09/29/16 1549)  sodium chloride 0.9 % bolus 500 mL (0 mLs Intravenous Stopped 09/29/16 1417)  iopamidol (ISOVUE-370) 76 % injection 100 mL (100 mLs Intravenous Contrast Given 09/29/16 1424)  morphine 4 MG/ML injection 4 mg (4 mg Intravenous Given 09/29/16 1509)    Angel [name] is 83 y.o. female presenting with Presenting with rhinorrhea, hoarse voice, chest pain that is generalized and described as tightness and shortness of breath. She is a chemotherapy patient with metastatic breast cancer however she is not neutropenic. Patient is afebrile and nontoxic appearing. Lung sounds clear with transmitted upper airway sounds. Patient will be given morphine and and duo neb. EKG with bigeminy initially however, this has resolved with repeat EKG, will check troponin and pro BMP, given patient's risk factors and shortness of breath will obtain CTA.  CTA negative, chest x-ray with likely viral infection. Troponin negative, Pro BNP is just over 100, blood work and  urinalysis reassuring. Influenza negative. Likely viral URI  This is a shared visit with the attending physician who personally evaluated the patient and agrees with the care plan.    Final Clinical Impressions(s) / ED Diagnoses   Final diagnoses:  Upper respiratory tract infection, unspecified type    New Prescriptions  New Prescriptions   ALBUTEROL (PROVENTIL HFA;VENTOLIN HFA) 108 (90 BASE) MCG/ACT INHALER    Inhale 1-2 puffs into the lungs every 4 (four) hours as needed for wheezing or shortness of breath.   HYDROCODONE-ACETAMINOPHEN (NORCO/VICODIN) 5-325 MG TABLET    Take 1-2 tablets by mouth every 6 hours as needed for pain and/or cough.   PREDNISONE (DELTASONE) 50 MG TABLET    Take 1 tablet daily with breakfast     Monico Blitz, PA-C 09/29/16 [place], MD 09/29/16 [phone]

## 2016-09-29 NOTE — Discharge Instructions (Signed)
Return to the emergency room for any worsening or concerning symptoms including fast breathing, heart racing, confusion, vomiting.  Rest, cover your mouth when you cough and wash your hands frequently.   Push fluids: water or Gatorade, do not drink any soda, juice or caffeinated beverages.  For fever and pain control you can take Motrin (ibuprofen) as follows: 400 mg (this is normally 2 over the counter pills) every 4 hours with food  Take Vicodin for cough and pain control, do not drink alcohol, drive, care for children or do other critical tasks while taking Vicodin.   Please follow with your primary care doctor in the next 2 days for a check-up. They must obtain records for further management.   Do not hesitate to return to the Emergency Department for any new, worsening or concerning symptoms.

## 2016-09-29 NOTE — ED Triage Notes (Addendum)
Pt c/o sob, nasal and chest congestion, cough, and chest tightness x 2 days.  Pain score 3/10.  Hx of breast and chest wall CA.  Last chemo on 12/27.

## 2016-09-29 NOTE — ED Notes (Signed)
IV morphine not administered d/t syringe malfunction.

## 2016-09-30 LAB — URINE CULTURE

## 2016-10-04 LAB — CULTURE, BLOOD (ROUTINE X 2)
CULTURE: NO GROWTH
Culture: NO GROWTH

## 2016-10-05 ENCOUNTER — Telehealth: Payer: Self-pay | Admitting: Oncology

## 2016-10-05 NOTE — Telephone Encounter (Signed)
left voicemail message to advise pt that appt has been changed from 10/20/16 to 10/21/16 at 10:00am

## 2016-10-11 ENCOUNTER — Other Ambulatory Visit: Payer: Self-pay | Admitting: Family Medicine

## 2016-10-11 DIAGNOSIS — F32A Depression, unspecified: Secondary | ICD-10-CM

## 2016-10-11 DIAGNOSIS — F329 Major depressive disorder, single episode, unspecified: Secondary | ICD-10-CM

## 2016-10-11 MED ORDER — CITALOPRAM HYDROBROMIDE 40 MG PO TABS: 40.0000 mg | ORAL_TABLET | Freq: Every day | ORAL | 0 refills | Status: DC

## 2016-10-11 NOTE — Addendum Note (Signed)
Addended by: Sharen Counter D on: 10/11/2016 04:37 PM   Modules accepted: Orders

## 2016-10-11 NOTE — Telephone Encounter (Signed)
Hard copy of refill request faxed to [facility] [phone]: [phone].

## 2016-10-11 NOTE — Telephone Encounter (Signed)
Received refill request for Celexa 40mg  (take 1 tab po qd) #90, 3RF  Last Rf: 05/03/2016 Last Ov: 05/03/2016 Next Ov: 11/03/2016 UDS: None  Forwarded to Provider for review, approval or denial.

## 2016-10-19 ENCOUNTER — Encounter (HOSPITAL_COMMUNITY): Payer: Medicare Other

## 2016-10-19 ENCOUNTER — Encounter (HOSPITAL_COMMUNITY): Admission: RE | Admit: 2016-10-19 | Payer: Medicare Other | Source: Ambulatory Visit

## 2016-10-20 ENCOUNTER — Ambulatory Visit: Payer: Medicare Other

## 2016-10-20 ENCOUNTER — Ambulatory Visit: Payer: Medicare Other | Admitting: Oncology

## 2016-10-20 ENCOUNTER — Other Ambulatory Visit: Payer: Medicare Other

## 2016-10-21 ENCOUNTER — Encounter: Payer: Self-pay | Admitting: Oncology

## 2016-10-21 ENCOUNTER — Ambulatory Visit (HOSPITAL_BASED_OUTPATIENT_CLINIC_OR_DEPARTMENT_OTHER): Payer: Medicare Other

## 2016-10-21 ENCOUNTER — Other Ambulatory Visit (HOSPITAL_BASED_OUTPATIENT_CLINIC_OR_DEPARTMENT_OTHER): Payer: Medicare Other

## 2016-10-21 ENCOUNTER — Ambulatory Visit (HOSPITAL_BASED_OUTPATIENT_CLINIC_OR_DEPARTMENT_OTHER): Payer: Medicare Other | Admitting: Oncology

## 2016-10-21 VITALS — BP 120/75 | HR 84 | Temp 97.6°F | Resp 17 | Ht 62.0 in | Wt 129.6 lb

## 2016-10-21 DIAGNOSIS — J45909 Unspecified asthma, uncomplicated: Secondary | ICD-10-CM | POA: Diagnosis not present

## 2016-10-21 DIAGNOSIS — C50911 Malignant neoplasm of unspecified site of right female breast: Secondary | ICD-10-CM

## 2016-10-21 DIAGNOSIS — C792 Secondary malignant neoplasm of skin: Secondary | ICD-10-CM

## 2016-10-21 DIAGNOSIS — C50811 Malignant neoplasm of overlapping sites of right female breast: Secondary | ICD-10-CM

## 2016-10-21 DIAGNOSIS — Z5111 Encounter for antineoplastic chemotherapy: Secondary | ICD-10-CM

## 2016-10-21 DIAGNOSIS — M8580 Other specified disorders of bone density and structure, unspecified site: Secondary | ICD-10-CM | POA: Diagnosis not present

## 2016-10-21 DIAGNOSIS — M818 Other osteoporosis without current pathological fracture: Secondary | ICD-10-CM

## 2016-10-21 DIAGNOSIS — Z170 Estrogen receptor positive status [ER+]: Secondary | ICD-10-CM | POA: Diagnosis not present

## 2016-10-21 LAB — CBC WITH DIFFERENTIAL/PLATELET
BASO%: 0.6 % (ref 0.0–2.0)
BASOS ABS: 0 10*3/uL (ref 0.0–0.1)
EOS ABS: 0.1 10*3/uL (ref 0.0–0.5)
EOS%: 1.8 % (ref 0.0–7.0)
HEMATOCRIT: 42.7 % (ref 34.8–46.6)
HGB: 14.1 g/dL (ref 11.6–15.9)
LYMPH#: 2.1 10*3/uL (ref 0.9–3.3)
LYMPH%: 43.1 % (ref 14.0–49.7)
MCH: 30.3 pg (ref 25.1–34.0)
MCHC: 33 g/dL (ref 31.5–36.0)
MCV: 91.9 fL (ref 79.5–101.0)
MONO#: 0.5 10*3/uL (ref 0.1–0.9)
MONO%: 9.8 % (ref 0.0–14.0)
NEUT#: 2.1 10*3/uL (ref 1.5–6.5)
NEUT%: 44.7 % (ref 38.4–76.8)
PLATELETS: 220 10*3/uL (ref 145–400)
RBC: 4.65 10*6/uL (ref 3.70–5.45)
RDW: 13.2 % (ref 11.2–14.5)
WBC: 4.8 10*3/uL (ref 3.9–10.3)

## 2016-10-21 LAB — COMPREHENSIVE METABOLIC PANEL
ALBUMIN: 4 g/dL (ref 3.5–5.0)
ALK PHOS: 44 U/L (ref 40–150)
ALT: 11 U/L (ref 0–55)
AST: 18 U/L (ref 5–34)
Anion Gap: 8 mEq/L (ref 3–11)
BUN: 12.4 mg/dL (ref 7.0–26.0)
CALCIUM: 9.9 mg/dL (ref 8.4–10.4)
CHLORIDE: 103 meq/L (ref 98–109)
CO2: 29 mEq/L (ref 22–29)
Creatinine: 0.8 mg/dL (ref 0.6–1.1)
EGFR: 70 mL/min/{1.73_m2} — ABNORMAL LOW (ref >90)
Glucose: 64 mg/dl — ABNORMAL LOW (ref 70–140)
POTASSIUM: 4.5 meq/L (ref 3.5–5.1)
Sodium: 140 mEq/L (ref 136–145)
Total Bilirubin: 0.47 mg/dL (ref 0.20–1.20)
Total Protein: 7.2 g/dL (ref 6.4–8.3)

## 2016-10-21 MED ORDER — FULVESTRANT 250 MG/5ML IM SOLN
500.0000 mg | Freq: Once | INTRAMUSCULAR | Status: AC
  Administered 2016-10-21: 500 mg via INTRAMUSCULAR
  Filled 2016-10-21: qty 10

## 2016-10-21 NOTE — Addendum Note (Signed)
Addended by: [name] on: 10/21/2016 10:41 AM   Modules accepted: Orders

## 2016-10-21 NOTE — Progress Notes (Unsigned)
Patient came in to inquire about co-pay assistance. Patient brought her approval letter and POE. Advised patient I have a copy of the letter as well as the billing department have been faxed a copy of both. Advised patient that the information at the bottom of the letter is the same as on the paper card and this is what she would use to get prescriptions at the pharmacy.   Patient came back and asked questions again about the same grant. I explained to her there is nothing she needs to do on her end and that billing will submit everything for her. Patient very nervous and anxious.  Patient has my card for any additional financial questions or concerns.

## 2016-10-21 NOTE — Progress Notes (Signed)
Angel [name]  Telephone:([phone]) [phone] Fax:(336) [phone]   ID: Angel [name] DOB: 11/22/1931  MR#: [id number]  ENI#:[id number]  Patient Care Team: Angel Held, DO as PCP - General (Family Medicine) Angel Cruel, MD as Consulting Physician (Oncology) Angel Margarita, MD as Consulting Physician (Cardiology) OTHER M.D.: Angel [name] (ophth)  CHIEF COMPLAINT: Chest wall recurrence of breast cancer  CURRENT TREATMENT: Fulvestrant, Denosumab/Prolia  BREAST CANCER HISTORY: Per Dr. 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 She had a second right breast cancer in 2012, with right mastectomy by Dr Angel [name] 07-29-2011 for T1N0M0 ER/PR positive, HER-2 negative invasive ductal carcinoma; she also had what was planned as prophylactic left mastectomy, with unexpected finding of left DCIS in that pathology. Plan in 09-2011 was to begin tamoxifen, chosen particularly due to low bone density. Patient did not keep follow up visits at this office, stopped tamoxifen due intolerance (tho she does not remember what problem she had with this) and was treated with raloxifene by Dr Angel [name].  She was seen next at this office on 12-26-2013 with an enlarging nodular area above right mastectomy scar,reportedly present for several months. Excisional biopsy done 01-21-14 found grade 2 invasive ductal carcinoma with involvement at Angel [name]) and lymphovascular space invasion, ER positive 100%, PR positive 75%, proliferation marker 80% and HER-2 negative by [facility] (pathology [phone]). PET 02-15-14 did not identify any involvement beyond right chest wall. She had 4500 cGy as electron beam radiation from 5-27 thru 03-27-14 to right chest wall. By 04-30-14 there were multiple tiny nodules in region of right mastectomy scar."  The patient's subsequent history is as detailed  below  INTERVAL HISTORY: Angel [name] returns today for follow-up of her locally recurrent estrogen receptor positive breast cancer. She continues on Faslodex monthly. She receives that with no complications and only minimal discomfort from the shot itself.  She is also on the nostril mass/Prolia every 6 months or her osteopenia. Her most recent dose was in November. She tolerates that without any side effects.  She did have an episode of the flu earlier this month. She was evaluated in the emergency room for this. They did a CT scan of the chest which showed no pneumonia but also no evidence of cancer.  REVIEW OF SYSTEMS: Angel [name] continues to be normally active for her age. She is busy at home and also at her church. She eats a Mediterranean diet, she tells me. She is concerned because she has gained 6 pounds. She continues to receive treatments through Dr. Nelva [name] for her arthritis pain. Aside from these issues a detailed review of systems today was entirely stable.  PAST MEDICAL HISTORY: Past Medical History:  Diagnosis Date  . Anxiety   . Asthma   . Breast cancer ([place]) 05/07/1999, 06/2011   a. s/p bilat mastectomies. recurrence 01/2014. Radiation, meds.  . Cancer of chest (wall) ([place])   . Cataract    eye surgery planned for 11/2014  . Chest wall recurrence of breast cancer ([place]) 2016  . Cholelithiasis   . Chronic bronchitis   . COPD (chronic obstructive pulmonary disease) ([place])   . Coronary artery disease Non-obstructive   a. 2012 Cath: LAD 40-8m>Med Rx;  b. 11/2013 Echo: EF 50-55%, nl wall motion;  c. 12/2013 Lexi MV: EF 74%, no ischemia.  . DDD (degenerative disc disease)   . Degenerative joint disease   . Depression   .  Dyslipidemia   . GERD (gastroesophageal reflux disease)   . Hx of radiation therapy 07/02/1999- 08/11/1999   right supraclavucular/axillary region: 5040 cGy, 28 fractions  . Hx of radiation therapy 02/20/14- 03/27/14   right chest wall 4500 cGy 25 sessions  .  Hypertension   . Kidney stones    one stones  . Osteoarthritis   . Osteoporosis   . Pre-diabetes   . PVC's (premature ventricular contractions)   . Reflux esophagitis   . Skin cancer of face    non melanoma  . Spinal stenosis     PAST SURGICAL HISTORY: Past Surgical History:  Procedure Laterality Date  . ABDOMINAL HYSTERECTOMY     in her 30's  . APPENDECTOMY    . BREAST BIOPSY Right 01/21/2014   Procedure: BREAST/CHEST WALL BIOPSY;  Surgeon: Angel Ok, MD;  Location: [facility];  Service: General;  Laterality: Right;  . BREAST LUMPECTOMY  2002   right breast  . CARDIAC CATHETERIZATION  03/2011, 3/15  . LEFT HEART CATHETERIZATION WITH CORONARY ANGIOGRAM N/A 12/05/2013   Procedure: LEFT HEART CATHETERIZATION WITH CORONARY ANGIOGRAM;  Surgeon: Angel Blanks, MD;  Location: [facility] CATH LAB;  Service: Cardiovascular;  Laterality: N/A;  . LEFT HEART CATHETERIZATION WITH CORONARY ANGIOGRAM N/A 11/06/2014   Procedure: LEFT HEART CATHETERIZATION WITH CORONARY ANGIOGRAM;  Surgeon: Angel Grooms, MD;  Location: [name] CATH LAB;  Service: Cardiovascular;  Laterality: N/A;  . MASTECTOMY  07/29/11   bilateral-rt nodes-none on left  . SMALL INTESTINE SURGERY  1998   SBO  . TONSILLECTOMY    . TUBAL LIGATION  1961  . VEIN LIGATION AND STRIPPING Right   . VESICOVAGINAL FISTULA CLOSURE W/ TAH  age 12    FAMILY HISTORY Family History  Problem Relation Age of Onset  . Pancreatic cancer Mother   . Cancer Mother     pancreatic  . Asthma Daughter   . Stroke Daughter   . Hypertension Daughter   . Heart disease Father   . Emphysema Father   . Lymphoma Brother   . Asthma Brother   . Asthma Grandchild   . Asthma Brother     deceased  . Heart disease Brother    the patient's father died at the age of 47 from heart disease. He was a smoker. The patient's mother died at the age of 25 with cancer of the pancreas. The patient has 3 brothers, no sisters. There is no history of breast or ovarian  cancer in the family to her knowledge.  GYNECOLOGIC HISTORY:  No LMP recorded. Patient has had a hysterectomy. Menarche age 74, first live birth age 73. She is GX P4. She stopped having periods in her 6s. She took hormone replacement for approximately 10 years. 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 She has been "single" for 40 years. Her oldest daughter died from a subarachnoid hemorrhage at age 60, 2 years ago. This is when the patient had undergone treatment for invasive breast cancer and she tells me she was simply overwhelmed and could not start the planned antiestrogen therapy. The next daughter, [name], is a retired Software engineer. She lives in [place]. Next child, Freda Munro, works in Charity fundraiser. She also lives in [place]. The youngest, [name], is retired from the Office manager position. He also owned his own business. The patient has 7 grandchildren and 2 many great-grandchildren to count. She attends the [employer]  [employer]    ADVANCED DIRECTIVES: In place; the patient's daughter Freda Munro is her healthcare power of attorney. She can be reached at [place]: Social History  Substance Use Topics  . Smoking status: Never Smoker  . Smokeless tobacco: Never Used  . Alcohol use Yes     Comment: occ wine     Colonoscopy:  PAP:  Bone density: August 2015   Lipid panel:  Allergies  Allergen Reactions  . Adhesive [Tape] Other (See Comments)    Pt prefers to use paper tape  . Penicillins Rash    Has patient had a PCN reaction causing immediate rash, facial/tongue/throat swelling, SOB or lightheadedness with hypotension: Yes Has patient had a PCN reaction causing severe rash involving mucus membranes or skin necrosis: No Has patient had a PCN reaction that required hospitalization No Has patient had a PCN reaction occurring within the last 10  years: No If all of the above answers are "NO", then may proceed with Cephalosporin use.     Current Outpatient Prescriptions  Medication Sig Dispense Refill  . albuterol (PROVENTIL HFA;VENTOLIN HFA) 108 (90 Base) MCG/ACT inhaler Inhale 1-2 puffs into the lungs every 4 (four) hours as needed for wheezing or shortness of breath. 1 Inhaler 0  . Artificial Tear Ointment (DRY EYES OP) Place 2 drops into both eyes daily as needed (dry eyes).     [name] aspirin EC 81 MG tablet Take 2 tablets (162 mg total) by mouth daily.    [name] atorvastatin (LIPITOR) 20 MG tablet Take 1 tablet (20 mg total) by mouth daily. 90 tablet 3  . B Complex-Biotin-FA (B-COMPLEX PO) Take 1 tablet by mouth daily.     . Biotin 5000 MCG CAPS Take 1 capsule (5,000 mcg total) by mouth daily. 90 capsule 3  . budesonide-formoterol (SYMBICORT) 160-4.5 MCG/ACT inhaler Inhale 2 puffs into the lungs 2 (two) times daily as needed. (Patient taking differently: Inhale 2 puffs into the lungs 2 (two) times daily as needed (wheezing, shortness of breath). ) 3 Inhaler 1  . Calcium-Magnesium-Zinc 1000-400-15 MG TABS Take 1 tablet by mouth daily.      . celecoxib (CELEBREX) 200 MG capsule Take 1 capsule daily with food for pain & inflammation (Patient taking differently: Take 200 mg by mouth daily. ) 90 capsule 3  . Cholecalciferol (VITAMIN D-3) 5000 UNITS TABS Take 1 tablet by mouth daily.     . citalopram (CELEXA) 20 MG tablet Take 1 tablet (20 mg total) by mouth daily. 90 tablet 1  . citalopram (CELEXA) 40 MG tablet Take 1 tablet (40 mg total) by mouth daily. 90 tablet 0  . diazepam (VALIUM) 5 MG tablet TAKE ONE-HALF TO ONE TABLET BY MOUTH EVERY 8 HOURS AS NEEDED FOR ANXIETY 45 tablet 0  . DULoxetine (CYMBALTA) 60 MG capsule Take 1 capsule by mouth  every day for mood 90 capsule 0  . ezetimibe (ZETIA) 10 MG tablet Take 1 tablet (10 mg total) by mouth daily.    . Fish Oil OIL Take 1 capsule by mouth daily.     . Flaxseed, Linseed, (GROUND FLAX SEEDS  PO) Take 5 mLs by mouth daily.     [name] gabapentin (NEURONTIN) 100 MG capsule 1-2 po nightly (Patient taking differently: Take 100-200 mg by mouth at bedtime as needed (pain). 1-2 po nightly) 60 capsule 3  . HYDROcodone-acetaminophen (NORCO/VICODIN) 5-325 MG tablet Take 1-2 tablets by mouth every 6 hours as needed for pain and/or cough. 11 tablet 0  .  isosorbide mononitrate (IMDUR) 30 MG 24 hr tablet Take 1 tablet (30 mg total) by mouth daily. 90 tablet 3  . montelukast (SINGULAIR) 10 MG tablet Take 1 tablet (10 mg total) by mouth at bedtime. 90 tablet 3  . Multiple Vitamin (MULTIVITAMIN) tablet Take 1 tablet by mouth daily.    . ondansetron (ZOFRAN ODT) 4 MG disintegrating tablet Take 1 tablet (4 mg total) by mouth every 8 (eight) hours as needed for nausea or vomiting. 20 tablet 0  . oxyCODONE-acetaminophen (PERCOCET/ROXICET) 5-325 MG tablet Take 1 tablet by mouth daily as needed for pain.    . predniSONE (DELTASONE) 50 MG tablet Take 1 tablet daily with breakfast 5 tablet 0   No current facility-administered medications for this visit.     OBJECTIVE: Elderly white woman  In no acute distress Vitals:   10/21/16 1012  BP: 120/75  Pulse: 84  Resp: 17  Temp: 97.6 F (36.4 C)     Body mass index is 23.7 kg/m.    ECOG FS:1 - Symptomatic but completely ambulatory  Sclerae unicteric, status post remote left injury Oropharynx clear and moist-- no thrush or other lesions No cervical or supraclavicular adenopathy Lungs no rales or rhonchi Heart regular rate and rhythm Abd soft, nontender, positive bowel sounds MSK no focal spinal tenderness, no upper extremity lymphedema Neuro: nonfocal, well oriented, appropriate affect Breasts: Status post bilateral mastectomies. There is no evidence of chest wall recurrence. Both axillae are benign.    LAB RESULTS:  CMP     Component Value Date/Time   NA 140 10/21/2016 0952   K 4.5 10/21/2016 0952   CL 102 09/29/2016 1222   CO2 29 10/21/2016 0952    GLUCOSE 64 (L) 10/21/2016 0952   BUN 12.4 10/21/2016 0952   CREATININE 0.8 10/21/2016 0952   CALCIUM 9.9 10/21/2016 0952   PROT 7.2 10/21/2016 0952   ALBUMIN 4.0 10/21/2016 0952   AST 18 10/21/2016 0952   ALT 11 10/21/2016 0952   ALKPHOS 44 10/21/2016 0952   BILITOT 0.47 10/21/2016 0952   GFRNONAA >60 09/29/2016 1222   GFRNONAA 73 05/01/2014 1050   GFRAA >60 09/29/2016 1222   GFRAA 84 05/01/2014 1050    I No results found for: SPEP  Lab Results  Component Value Date   WBC 4.8 10/21/2016   NEUTROABS 2.1 10/21/2016   HGB 14.1 10/21/2016   HCT 42.7 10/21/2016   MCV 91.9 10/21/2016   PLT 220 10/21/2016      Chemistry      Component Value Date/Time   NA 140 10/21/2016 0952   K 4.5 10/21/2016 0952   CL 102 09/29/2016 1222   CO2 29 10/21/2016 0952   BUN 12.4 10/21/2016 0952   CREATININE 0.8 10/21/2016 0952      Component Value Date/Time   CALCIUM 9.9 10/21/2016 0952   ALKPHOS 44 10/21/2016 0952   AST 18 10/21/2016 0952   ALT 11 10/21/2016 0952   BILITOT 0.47 10/21/2016 0952       Lab Results  Component Value Date   LABCA2 12 11/26/2015    No components found for: [id number]  No results for input(s): INR in the last 168 hours.  Urinalysis    Component Value Date/Time   COLORURINE YELLOW 09/29/2016 1315   APPEARANCEUR CLEAR 09/29/2016 1315   LABSPEC 1.011 09/29/2016 1315   PHURINE 6.0 09/29/2016 1315   GLUCOSEU NEGATIVE 09/29/2016 1315   HGBUR NEGATIVE 09/29/2016 1315   BILIRUBINUR NEGATIVE 09/29/2016 1315   BILIRUBINUR neg 04/25/2015  1015   KETONESUR NEGATIVE 09/29/2016 1315   PROTEINUR NEGATIVE 09/29/2016 1315   UROBILINOGEN 0.2 04/25/2015 1015   UROBILINOGEN 0.2 02/14/2015 2010   NITRITE NEGATIVE 09/29/2016 1315   LEUKOCYTESUR NEGATIVE 09/29/2016 1315    STUDIES: Dg Chest 2 View  Result Date: 09/29/2016 CLINICAL DATA:  Initial evaluation for acute cough and congestion for 2 days. History of asthma. EXAM: CHEST  2 VIEW COMPARISON:  Prior  radiograph from 07/23/2015. FINDINGS: Cardiac and mediastinal silhouettes are stable in size and contour, and remain within normal limits. Aortic atherosclerosis. Elevation of the right hemidiaphragm, similar to previous, and likely chronic. Subtly increased peribronchial thickening as compared to previous, which may reflect acute bronchiolitis and/ or changes related asthma. No consolidative airspace disease. No pulmonary edema or pleural effusion. No pneumothorax. No acute osseous abnormality.  Osteopenia noted. IMPRESSION: 1. Subtle diffuse peribronchial thickening, which may reflect acute bronchiolitis and/or changes related to asthma. No consolidative opacity to suggest pneumonia. 2. Chronic elevation of the right hemidiaphragm, stable. 3. Aortic atherosclerosis. Electronically Signed   By: Rise Mu M.D.   On: 09/29/2016 14:25   Ct Angio Chest Pe W And/or Wo Contrast  Result Date: 09/29/2016 CLINICAL DATA:  Chest pain and shortness of breath ; history hypertension, coronary artery disease, breast cancer post BILATERAL mastectomy with chest wall recurrence and radiation therapy, COPD, asthma EXAM: CT ANGIOGRAPHY CHEST WITH CONTRAST TECHNIQUE: Multidetector CT imaging of the chest was performed using the standard protocol during bolus administration of intravenous contrast. Multiplanar CT image reconstructions and MIPs were obtained to evaluate the vascular anatomy. CONTRAST:  100 cc Isovue 370 IV COMPARISON:  07/23/2015 FINDINGS: Cardiovascular: Atherosclerotic calcifications aorta without aneurysm or dissection. Scattered coronary arterial calcification. No pericardial effusion. Pulmonary arteries adequately opacified and patent. No evidence of pulmonary embolism. Mediastinum/Nodes: Esophagus is partially air filled throughout its length. Cannot exclude mild esophageal wall thickening though this could be an artifact from incomplete distention. Base of cervical region unremarkable. No definite  thoracic adenopathy. Surgical clips at the axilla bilaterally. Post BILATERAL mastectomy. Lungs/Pleura: Post radiation therapy changes at the anterolateral RIGHT upper lobe peripherally. Minimal dependent atelectasis RIGHT lower lobe. No definite pulmonary infiltrate, pleural effusion or pneumothorax. Upper Abdomen: Calcified gallstone within gallbladder at least 13 mm diameter, image 89. Remaining visualized upper abdomen unremarkable. Musculoskeletal: Superior endplate concavity at T9 unchanged. No acute osseous findings. Review of the MIP images confirms the above findings. IMPRESSION: No evidence of pulmonary embolism. Aortic atherosclerosis. Coronary arterial calcification. Cholelithiasis. Minimal post radiation therapy change peripherally in anterolateral RIGHT upper lobe. Electronically Signed   By: Ulyses Southward M.D.   On: 09/29/2016 14:50    ASSESSMENT: 83 y.o. [place] woman with locally recurrent breast cancer, as follows:  (1) status post right lumpectomy and sentinel node sampling September 2000 for ductal carcinoma in situ (Ni+), estrogen and progesterone receptor positive, with a low proliferation index,  (a) received 5040 cGy to the right breast and regional lymph nodes  (b) on tamoxifen less than 3 months  (2) s/p bilateral mastectomies and right axillary lymph node sampling 07/29/2011, showing  (a) in the left breast, ductal carcinoma in situ measuring 1.2 cm, grade 1, with negative margins, estrogen and progesterone receptor positive  (b)  on the right, a pT1c pN0, stage IA invasive ductal carcinoma, estrogen receptor and 93% positive, progesterone receptor 87% positive, with an MIB-1 of 35%, and no HER-2 amplification margins were close but negative  (c) did not receive adjuvant antiestrogen therapy  (3) right  chest wall skin recurrence resected 01/21/2014, measuring 1.7 cm, with positive margins, estrogen receptor 100% positive, progesterone receptor 75% positive, with an MIB-1 of  80%, and no HER-2 amplification; PET scan 02/15/2014 negative except for Right chest wall focus  (a) received 45 Gy electron-beam therapy to the right chest wall completed 03/27/2014  (b) skin progression within and without the radiation field noted 04/30/2014  (c) letrozole started 05/13/2014, discontinued March 2016 with progression   (4) dexa scan 05/09/2014 at [name] shows osteoporosis with a T score of -2.9  (5) Fulvestrant started 12/25/2014  (6) osteopenia, on DEXA scan 05/09/2014  (a) started Denosumab/prolia 08/06/2015, repeated every 24 weeks  (7) Chronic pain: Followed by Dr. Nelva [name]  PLAN: Desa will soon be 3 years out from local recurrence of her breast cancer without evidence of metastatic spread. This is very favorable.  She is tolerating the fulvestrant well, essentially with no side effects. The plan will be to continue that indefinitely, although at 5 years from her recurrence we might consider moving to observation alone.  She is also receiving denosumab/Prolia for her osteopenia. She will receive her next dose in May.  We reviewed her CT scan obtained at the emergency room when she had the flu and this is very reassuring as it shows no evidence of lung or liver involvement. She is ready scheduled for a bone scan later this month.  She does have financial concerns regarding her treatment. I am asking her to meet with our financial advisors today.  She knows to call for any problems that may develop before the next visit.  Angel Cruel, MD   10/21/2016 10:34 AM a

## 2016-10-21 NOTE — Patient Instructions (Signed)

## 2016-10-27 ENCOUNTER — Encounter (HOSPITAL_COMMUNITY)
Admission: RE | Admit: 2016-10-27 | Discharge: 2016-10-27 | Disposition: A | Discharge status: Home / Self Care | Payer: Medicare Other | Source: Ambulatory Visit | Attending: Oncology | Admitting: Oncology

## 2016-10-27 DIAGNOSIS — C50911 Malignant neoplasm of unspecified site of right female breast: Secondary | ICD-10-CM | POA: Diagnosis not present

## 2016-10-27 DIAGNOSIS — C50919 Malignant neoplasm of unspecified site of unspecified female breast: Secondary | ICD-10-CM | POA: Diagnosis not present

## 2016-10-27 MED ORDER — TECHNETIUM TC 99M MEDRONATE IV KIT
25.0000 | PACK | Freq: Once | INTRAVENOUS | Status: AC | PRN
  Administered 2016-10-27: 21.3 via INTRAVENOUS

## 2016-10-29 ENCOUNTER — Telehealth: Payer: Self-pay | Admitting: *Deleted

## 2016-10-29 ENCOUNTER — Telehealth: Payer: Self-pay | Admitting: Oncology

## 2016-10-29 NOTE — Telephone Encounter (Signed)
sw pt to confirm upcomming appt date/time per LOS

## 2016-10-29 NOTE — Telephone Encounter (Signed)
Pt called wanting to know when her next lab and injection appts will be.  No appts scheduled yet.  Copied message order from office visit  10/21/16 to scheduler.   Spoke with pt, and informed pt that a scheduler will follow up on next appts. Pt voiced understanding. Pt's  Phone    [phone].

## 2016-11-18 ENCOUNTER — Ambulatory Visit (HOSPITAL_BASED_OUTPATIENT_CLINIC_OR_DEPARTMENT_OTHER): Payer: Medicare Other

## 2016-11-18 ENCOUNTER — Other Ambulatory Visit (HOSPITAL_BASED_OUTPATIENT_CLINIC_OR_DEPARTMENT_OTHER): Payer: Medicare Other

## 2016-11-18 VITALS — BP 130/52 | HR 70 | Temp 97.0°F | Resp 18

## 2016-11-18 DIAGNOSIS — M818 Other osteoporosis without current pathological fracture: Secondary | ICD-10-CM

## 2016-11-18 DIAGNOSIS — C7989 Secondary malignant neoplasm of other specified sites: Secondary | ICD-10-CM

## 2016-11-18 DIAGNOSIS — C50911 Malignant neoplasm of unspecified site of right female breast: Secondary | ICD-10-CM | POA: Diagnosis not present

## 2016-11-18 DIAGNOSIS — Z5111 Encounter for antineoplastic chemotherapy: Secondary | ICD-10-CM

## 2016-11-18 LAB — COMPREHENSIVE METABOLIC PANEL
ALT: 10 U/L (ref 0–55)
AST: 16 U/L (ref 5–34)
Albumin: 3.9 g/dL (ref 3.5–5.0)
Alkaline Phosphatase: 40 U/L (ref 40–150)
Anion Gap: 7 mEq/L (ref 3–11)
BUN: 13.9 mg/dL (ref 7.0–26.0)
CALCIUM: 9.8 mg/dL (ref 8.4–10.4)
CHLORIDE: 103 meq/L (ref 98–109)
CO2: 29 meq/L (ref 22–29)
CREATININE: 0.8 mg/dL (ref 0.6–1.1)
EGFR: 74 mL/min/{1.73_m2} — ABNORMAL LOW (ref >90)
GLUCOSE: 95 mg/dL (ref 70–140)
Potassium: 4.7 mEq/L (ref 3.5–5.1)
SODIUM: 139 meq/L (ref 136–145)
Total Bilirubin: 0.34 mg/dL (ref 0.20–1.20)
Total Protein: 6.6 g/dL (ref 6.4–8.3)

## 2016-11-18 LAB — CBC WITH DIFFERENTIAL/PLATELET
BASO%: 0.8 % (ref 0.0–2.0)
Basophils Absolute: 0 10*3/uL (ref 0.0–0.1)
EOS%: 2.1 % (ref 0.0–7.0)
Eosinophils Absolute: 0.1 10*3/uL (ref 0.0–0.5)
HEMATOCRIT: 38.3 % (ref 34.8–46.6)
HEMOGLOBIN: 12.7 g/dL (ref 11.6–15.9)
LYMPH#: 2.1 10*3/uL (ref 0.9–3.3)
LYMPH%: 39.5 % (ref 14.0–49.7)
MCH: 30.1 pg (ref 25.1–34.0)
MCHC: 33.2 g/dL (ref 31.5–36.0)
MCV: 90.7 fL (ref 79.5–101.0)
MONO#: 0.5 10*3/uL (ref 0.1–0.9)
MONO%: 9.8 % (ref 0.0–14.0)
NEUT#: 2.5 10*3/uL (ref 1.5–6.5)
NEUT%: 47.8 % (ref 38.4–76.8)
Platelets: 185 10*3/uL (ref 145–400)
RBC: 4.22 10*6/uL (ref 3.70–5.45)
RDW: 12.8 % (ref 11.2–14.5)
WBC: 5.3 10*3/uL (ref 3.9–10.3)

## 2016-11-18 MED ORDER — FULVESTRANT 250 MG/5ML IM SOLN
500.0000 mg | Freq: Once | INTRAMUSCULAR | Status: AC
Start: 2016-11-18 — End: 2016-11-18
  Administered 2016-11-18: 500 mg via INTRAMUSCULAR
  Filled 2016-11-18: qty 10

## 2016-11-18 NOTE — Patient Instructions (Signed)

## 2016-11-21 DIAGNOSIS — J45909 Unspecified asthma, uncomplicated: Secondary | ICD-10-CM | POA: Diagnosis not present

## 2016-11-29 ENCOUNTER — Other Ambulatory Visit: Payer: Self-pay | Admitting: Oncology

## 2016-11-29 ENCOUNTER — Other Ambulatory Visit: Payer: Self-pay | Admitting: Family Medicine

## 2016-11-29 DIAGNOSIS — F32A Depression, unspecified: Secondary | ICD-10-CM

## 2016-11-29 DIAGNOSIS — F329 Major depressive disorder, single episode, unspecified: Secondary | ICD-10-CM

## 2016-12-14 DIAGNOSIS — M5030 Other cervical disc degeneration, unspecified cervical region: Secondary | ICD-10-CM | POA: Diagnosis not present

## 2016-12-14 DIAGNOSIS — M5136 Other intervertebral disc degeneration, lumbar region: Secondary | ICD-10-CM | POA: Diagnosis not present

## 2016-12-15 ENCOUNTER — Other Ambulatory Visit (HOSPITAL_BASED_OUTPATIENT_CLINIC_OR_DEPARTMENT_OTHER): Payer: Medicare Other

## 2016-12-15 ENCOUNTER — Ambulatory Visit (HOSPITAL_BASED_OUTPATIENT_CLINIC_OR_DEPARTMENT_OTHER): Payer: Medicare Other

## 2016-12-15 VITALS — BP 130/71 | HR 72 | Temp 97.7°F | Resp 18

## 2016-12-15 DIAGNOSIS — C7989 Secondary malignant neoplasm of other specified sites: Secondary | ICD-10-CM

## 2016-12-15 DIAGNOSIS — Z5111 Encounter for antineoplastic chemotherapy: Secondary | ICD-10-CM | POA: Diagnosis not present

## 2016-12-15 DIAGNOSIS — C50911 Malignant neoplasm of unspecified site of right female breast: Secondary | ICD-10-CM | POA: Diagnosis not present

## 2016-12-15 DIAGNOSIS — M818 Other osteoporosis without current pathological fracture: Secondary | ICD-10-CM

## 2016-12-15 LAB — CBC WITH DIFFERENTIAL/PLATELET
BASO%: 0.4 % (ref 0.0–2.0)
Basophils Absolute: 0 10*3/uL (ref 0.0–0.1)
EOS ABS: 0 10*3/uL (ref 0.0–0.5)
EOS%: 0.1 % (ref 0.0–7.0)
HEMATOCRIT: 38.2 % (ref 34.8–46.6)
HGB: 12.8 g/dL (ref 11.6–15.9)
LYMPH#: 2 10*3/uL (ref 0.9–3.3)
LYMPH%: 24.9 % (ref 14.0–49.7)
MCH: 30.1 pg (ref 25.1–34.0)
MCHC: 33.5 g/dL (ref 31.5–36.0)
MCV: 90.1 fL (ref 79.5–101.0)
MONO#: 0.7 10*3/uL (ref 0.1–0.9)
MONO%: 9.5 % (ref 0.0–14.0)
NEUT%: 65.1 % (ref 38.4–76.8)
NEUTROS ABS: 5.1 10*3/uL (ref 1.5–6.5)
PLATELETS: 197 10*3/uL (ref 145–400)
RBC: 4.24 10*6/uL (ref 3.70–5.45)
RDW: 12.9 % (ref 11.2–14.5)
WBC: 7.9 10*3/uL (ref 3.9–10.3)

## 2016-12-15 LAB — COMPREHENSIVE METABOLIC PANEL
ALT: 11 U/L (ref 0–55)
ANION GAP: 6 meq/L (ref 3–11)
AST: 16 U/L (ref 5–34)
Albumin: 3.8 g/dL (ref 3.5–5.0)
Alkaline Phosphatase: 40 U/L (ref 40–150)
BUN: 14.7 mg/dL (ref 7.0–26.0)
CALCIUM: 9.7 mg/dL (ref 8.4–10.4)
CHLORIDE: 101 meq/L (ref 98–109)
CO2: 29 meq/L (ref 22–29)
Creatinine: 0.8 mg/dL (ref 0.6–1.1)
EGFR: 74 mL/min/{1.73_m2} — ABNORMAL LOW (ref >90)
Glucose: 71 mg/dl (ref 70–140)
POTASSIUM: 4.5 meq/L (ref 3.5–5.1)
Sodium: 137 mEq/L (ref 136–145)
Total Bilirubin: 0.28 mg/dL (ref 0.20–1.20)
Total Protein: 6.7 g/dL (ref 6.4–8.3)

## 2016-12-15 MED ORDER — FULVESTRANT 250 MG/5ML IM SOLN
500.0000 mg | Freq: Once | INTRAMUSCULAR | Status: AC
Start: 2016-12-15 — End: 2016-12-15
  Administered 2016-12-15: 500 mg via INTRAMUSCULAR
  Filled 2016-12-15: qty 10

## 2016-12-15 NOTE — Patient Instructions (Signed)

## 2016-12-16 ENCOUNTER — Ambulatory Visit: Payer: Medicare Other

## 2016-12-16 ENCOUNTER — Other Ambulatory Visit: Payer: Medicare Other

## 2016-12-17 ENCOUNTER — Other Ambulatory Visit: Payer: Self-pay | Admitting: Family Medicine

## 2016-12-17 DIAGNOSIS — F32A Depression, unspecified: Secondary | ICD-10-CM

## 2016-12-17 DIAGNOSIS — F329 Major depressive disorder, single episode, unspecified: Secondary | ICD-10-CM

## 2016-12-19 DIAGNOSIS — J45909 Unspecified asthma, uncomplicated: Secondary | ICD-10-CM | POA: Diagnosis not present

## 2017-01-03 DIAGNOSIS — G894 Chronic pain syndrome: Secondary | ICD-10-CM | POA: Diagnosis not present

## 2017-01-11 ENCOUNTER — Ambulatory Visit (HOSPITAL_BASED_OUTPATIENT_CLINIC_OR_DEPARTMENT_OTHER): Payer: Medicare Other

## 2017-01-11 ENCOUNTER — Other Ambulatory Visit (HOSPITAL_BASED_OUTPATIENT_CLINIC_OR_DEPARTMENT_OTHER): Payer: Medicare Other

## 2017-01-11 ENCOUNTER — Telehealth: Payer: Self-pay | Admitting: Medical Oncology

## 2017-01-11 VITALS — BP 133/81 | HR 77 | Temp 97.8°F | Resp 20

## 2017-01-11 DIAGNOSIS — C50911 Malignant neoplasm of unspecified site of right female breast: Secondary | ICD-10-CM | POA: Diagnosis not present

## 2017-01-11 DIAGNOSIS — Z5111 Encounter for antineoplastic chemotherapy: Secondary | ICD-10-CM | POA: Diagnosis not present

## 2017-01-11 DIAGNOSIS — M818 Other osteoporosis without current pathological fracture: Secondary | ICD-10-CM

## 2017-01-11 LAB — COMPREHENSIVE METABOLIC PANEL
ALT: 12 U/L (ref 0–55)
ANION GAP: 7 meq/L (ref 3–11)
AST: 18 U/L (ref 5–34)
Albumin: 3.9 g/dL (ref 3.5–5.0)
Alkaline Phosphatase: 37 U/L — ABNORMAL LOW (ref 40–150)
BUN: 15 mg/dL (ref 7.0–26.0)
CALCIUM: 9.6 mg/dL (ref 8.4–10.4)
CHLORIDE: 102 meq/L (ref 98–109)
CO2: 29 meq/L (ref 22–29)
CREATININE: 0.8 mg/dL (ref 0.6–1.1)
EGFR: 72 mL/min/{1.73_m2} — AB (ref >90)
Glucose: 93 mg/dl (ref 70–140)
Potassium: 4.5 mEq/L (ref 3.5–5.1)
Sodium: 137 mEq/L (ref 136–145)
Total Bilirubin: 0.52 mg/dL (ref 0.20–1.20)
Total Protein: 6.7 g/dL (ref 6.4–8.3)

## 2017-01-11 LAB — CBC WITH DIFFERENTIAL/PLATELET
BASO%: 0.9 % (ref 0.0–2.0)
BASOS ABS: 0 10*3/uL (ref 0.0–0.1)
EOS ABS: 0.1 10*3/uL (ref 0.0–0.5)
EOS%: 1.5 % (ref 0.0–7.0)
HEMATOCRIT: 38.8 % (ref 34.8–46.6)
HGB: 12.9 g/dL (ref 11.6–15.9)
LYMPH#: 2.3 10*3/uL (ref 0.9–3.3)
LYMPH%: 41.7 % (ref 14.0–49.7)
MCH: 29.8 pg (ref 25.1–34.0)
MCHC: 33.1 g/dL (ref 31.5–36.0)
MCV: 89.9 fL (ref 79.5–101.0)
MONO#: 0.5 10*3/uL (ref 0.1–0.9)
MONO%: 8.7 % (ref 0.0–14.0)
NEUT#: 2.6 10*3/uL (ref 1.5–6.5)
NEUT%: 47.2 % (ref 38.4–76.8)
PLATELETS: 210 10*3/uL (ref 145–400)
RBC: 4.32 10*6/uL (ref 3.70–5.45)
RDW: 13.3 % (ref 11.2–14.5)
WBC: 5.5 10*3/uL (ref 3.9–10.3)

## 2017-01-11 MED ORDER — FULVESTRANT 250 MG/5ML IM SOLN
500.0000 mg | Freq: Once | INTRAMUSCULAR | Status: AC
  Administered 2017-01-11: 500 mg via INTRAMUSCULAR
  Filled 2017-01-11: qty 10

## 2017-01-11 NOTE — Telephone Encounter (Signed)
Dtr found that pt had 3  bottles left of celexa ( 90 day mail order) . She thinks her mothers last dose was march 30 and she did not realize she received mail order supply. Again I old er to contact PCP with this information.

## 2017-01-11 NOTE — Telephone Encounter (Signed)
'  Depressed , not eating , sleeping all the time and irritable" . I spoke to dtr and pt who reiterated the same symptoms and they started 6 weeks ago. She is not taking the celexa that her PCP prescribed last month ( pt/daughter  said she was not aware ). She said Prozac works better -she says she takes Cymbalta for pain.  I told pt and daughter to call Dr Roma Schanz about her symptoms and let her know she is not taking the celexa.

## 2017-01-11 NOTE — Patient Instructions (Signed)

## 2017-01-11 NOTE — Telephone Encounter (Signed)
Please contact patient and see if there is anything we can do to get patient to take her meds. If she had a side effect what was it so I can change meds. She may to come in and see her PCP next week

## 2017-01-12 ENCOUNTER — Other Ambulatory Visit: Payer: Medicare Other

## 2017-01-12 ENCOUNTER — Ambulatory Visit: Payer: Medicare Other

## 2017-01-12 NOTE — Telephone Encounter (Signed)
Perfect again.

## 2017-01-12 NOTE — Telephone Encounter (Signed)
Spoke w/ patient. She restarted Celexa yesterday and states she is already feeling better. She is overdue for PCP follow-up, but did not have her calendar with her at time of call. We agreed she would call back to schedule follow-up appt within the next 2-3 weeks and let us know if symptoms not improving w/ antidepressant.

## 2017-01-13 ENCOUNTER — Ambulatory Visit: Payer: Medicare Other

## 2017-01-13 ENCOUNTER — Other Ambulatory Visit: Payer: Medicare Other

## 2017-02-09 ENCOUNTER — Other Ambulatory Visit (HOSPITAL_BASED_OUTPATIENT_CLINIC_OR_DEPARTMENT_OTHER): Payer: Medicare Other

## 2017-02-09 ENCOUNTER — Ambulatory Visit (HOSPITAL_BASED_OUTPATIENT_CLINIC_OR_DEPARTMENT_OTHER): Payer: Medicare Other | Admitting: Oncology

## 2017-02-09 ENCOUNTER — Ambulatory Visit (HOSPITAL_BASED_OUTPATIENT_CLINIC_OR_DEPARTMENT_OTHER): Payer: Medicare Other

## 2017-02-09 VITALS — BP 123/68 | HR 67 | Temp 97.7°F | Resp 19 | Ht 62.0 in | Wt 132.2 lb

## 2017-02-09 DIAGNOSIS — C50911 Malignant neoplasm of unspecified site of right female breast: Secondary | ICD-10-CM

## 2017-02-09 DIAGNOSIS — Z170 Estrogen receptor positive status [ER+]: Secondary | ICD-10-CM

## 2017-02-09 DIAGNOSIS — Z5111 Encounter for antineoplastic chemotherapy: Secondary | ICD-10-CM

## 2017-02-09 DIAGNOSIS — M810 Age-related osteoporosis without current pathological fracture: Secondary | ICD-10-CM | POA: Diagnosis not present

## 2017-02-09 DIAGNOSIS — M818 Other osteoporosis without current pathological fracture: Secondary | ICD-10-CM

## 2017-02-09 DIAGNOSIS — C50811 Malignant neoplasm of overlapping sites of right female breast: Secondary | ICD-10-CM

## 2017-02-09 LAB — CBC WITH DIFFERENTIAL/PLATELET
BASO%: 0.8 % (ref 0.0–2.0)
BASOS ABS: 0 10*3/uL (ref 0.0–0.1)
EOS%: 4.1 % (ref 0.0–7.0)
Eosinophils Absolute: 0.2 10*3/uL (ref 0.0–0.5)
HEMATOCRIT: 38.8 % (ref 34.8–46.6)
HGB: 12.8 g/dL (ref 11.6–15.9)
LYMPH#: 2.2 10*3/uL (ref 0.9–3.3)
LYMPH%: 38.9 % (ref 14.0–49.7)
MCH: 30 pg (ref 25.1–34.0)
MCHC: 33 g/dL (ref 31.5–36.0)
MCV: 90.7 fL (ref 79.5–101.0)
MONO#: 0.5 10*3/uL (ref 0.1–0.9)
MONO%: 8.5 % (ref 0.0–14.0)
NEUT#: 2.8 10*3/uL (ref 1.5–6.5)
NEUT%: 47.7 % (ref 38.4–76.8)
PLATELETS: 213 10*3/uL (ref 145–400)
RBC: 4.28 10*6/uL (ref 3.70–5.45)
RDW: 13.6 % (ref 11.2–14.5)
WBC: 5.8 10*3/uL (ref 3.9–10.3)

## 2017-02-09 LAB — COMPREHENSIVE METABOLIC PANEL
ALBUMIN: 3.7 g/dL (ref 3.5–5.0)
ALT: 10 U/L (ref 0–55)
ANION GAP: 8 meq/L (ref 3–11)
AST: 19 U/L (ref 5–34)
Alkaline Phosphatase: 40 U/L (ref 40–150)
BILIRUBIN TOTAL: 0.42 mg/dL (ref 0.20–1.20)
BUN: 14.7 mg/dL (ref 7.0–26.0)
CALCIUM: 9.3 mg/dL (ref 8.4–10.4)
CHLORIDE: 102 meq/L (ref 98–109)
CO2: 29 mEq/L (ref 22–29)
CREATININE: 0.8 mg/dL (ref 0.6–1.1)
EGFR: 74 mL/min/{1.73_m2} — ABNORMAL LOW (ref >90)
Glucose: 94 mg/dl (ref 70–140)
Potassium: 4.5 mEq/L (ref 3.5–5.1)
Sodium: 139 mEq/L (ref 136–145)
TOTAL PROTEIN: 6.4 g/dL (ref 6.4–8.3)

## 2017-02-09 MED ORDER — DENOSUMAB 60 MG/ML ~~LOC~~ SOLN
60.0000 mg | Freq: Once | SUBCUTANEOUS | Status: AC
  Administered 2017-02-09: 60 mg via SUBCUTANEOUS
  Filled 2017-02-09: qty 1

## 2017-02-09 MED ORDER — FULVESTRANT 250 MG/5ML IM SOLN
500.0000 mg | Freq: Once | INTRAMUSCULAR | Status: AC
  Administered 2017-02-09: 500 mg via INTRAMUSCULAR
  Filled 2017-02-09: qty 10

## 2017-02-09 MED ORDER — DIAZEPAM 5 MG PO TABS: 5.0000 mg | ORAL_TABLET | Freq: Four times a day (QID) | ORAL | 1 refills | Status: DC | PRN

## 2017-02-09 NOTE — Progress Notes (Signed)
[facility]  [phone]) [phone] Fax:(336) [phone]   ID: Angel [name] DOB: 1932/03/16  MR#: [id number]  JYN#:[id number]  Patient Care Team: Carollee Herter, Alferd Apa, DO as PCP - General (Family Medicine) Sameul Tagle, Virgie Dad, MD as Consulting Physician (Oncology) [name], MD as Consulting Physician (Cardiology) OTHER M.D.: Maisie Fus Kurup (ophth)  CHIEF COMPLAINT: Chest wall recurrence of breast cancer  CURRENT TREATMENT: Fulvestrant, Denosumab/Prolia  BREAST CANCER HISTORY: Per Dr. 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 She had a second right breast cancer in 2012, with right mastectomy by Dr [name] 07-29-2011 for T1N0M0 ER/PR positive, HER-2 negative invasive ductal carcinoma; she also had what was planned as prophylactic left mastectomy, with unexpected finding of left DCIS in that pathology. Plan in 09-2011 was to begin tamoxifen, chosen particularly due to low bone density. Patient did not keep follow up visits at this office, stopped tamoxifen due intolerance (tho she does not remember what problem she had with this) and was treated with raloxifene by Dr [name].  She was seen next at this office on 12-26-2013 with an enlarging nodular area above right mastectomy scar,reportedly present for several months. Excisional biopsy done 01-21-14 found grade 2 invasive ductal carcinoma with involvement at [facility]) and lymphovascular space invasion, ER positive 100%, PR positive 75%, proliferation marker 80% and HER-2 negative by [facility] (pathology [phone]). PET 02-15-14 did not identify any involvement beyond right chest wall. She had 4500 cGy as electron beam radiation from 5-27 thru 03-27-14 to right chest wall. By 04-30-14 there were multiple tiny nodules in region of right mastectomy scar."  The patient's subsequent history is as detailed  below  INTERVAL HISTORY: Angel [name] returns today for follow-up of her estrogen receptor positive breast cancer. She is receiving fulvestrant every 28 days. She tolerates that with mild discomfort from the injections but otherwise no side effects that she is aware of.  She also receives denosumab/Prolia every 6 months, with a dose due today. She has had no hypocalcemia or other side effects from that medication.  REVIEW OF SYSTEMS: Angel [name] usually takes Valium sometime in the early evening to help her sleep later. We have been refilling that vary regularly for her every couple of months. She just had refilled on May 10. She tells me that she did refill it remember seeing at at home and that she cannot find the bottle. She thinks that she threw it out. She does not think that her family took it. She is thinks that she remember seeing it after all her family left. Aside from this she continues to have pain in her back legs and chest. She receives narcotics through Dr. Dossie Der and that also has been very stable--I checked the controlled substances database today. A detailed review of systems was otherwise stable PAST MEDICAL HISTORY: Past Medical History:  Diagnosis Date  . Anxiety   . Asthma   . Breast cancer ([place]) 05/07/1999, 06/2011   a. s/p bilat mastectomies. recurrence 01/2014. Radiation, meds.  . Cancer of chest (wall) ([place])   . Cataract    eye surgery planned for 11/2014  . Chest wall recurrence of breast cancer ([place]) 2016  . Cholelithiasis   . Chronic bronchitis   . COPD (chronic obstructive pulmonary disease) ([place])   . Coronary artery disease Non-obstructive   a. 2012 Cath: LAD 40-62m>Med Rx;  b. 11/2013 Echo: EF 50-55%, nl wall motion;  c. 12/2013 Lexi MV: EF 74%, no ischemia.  . DDD (degenerative disc disease)   . Degenerative joint disease   . Depression   . Dyslipidemia   . GERD (gastroesophageal reflux disease)   . Hx of radiation therapy 07/02/1999- 08/11/1999   right  supraclavucular/axillary region: 5040 cGy, 28 fractions  . Hx of radiation therapy 02/20/14- 03/27/14   right chest wall 4500 cGy 25 sessions  . Hypertension   . Kidney stones    one stones  . Osteoarthritis   . Osteoporosis   . Pre-diabetes   . PVC's (premature ventricular contractions)   . Reflux esophagitis   . Skin cancer of face    non melanoma  . Spinal stenosis     PAST SURGICAL HISTORY: Past Surgical History:  Procedure Laterality Date  . ABDOMINAL HYSTERECTOMY     in her 30's  . APPENDECTOMY    . BREAST BIOPSY Right 01/21/2014   Procedure: BREAST/CHEST WALL BIOPSY;  Surgeon: [name], MD;  Location: [facility];  Service: General;  Laterality: Right;  . BREAST LUMPECTOMY  2002   right breast  . CARDIAC CATHETERIZATION  03/2011, 3/15  . LEFT HEART CATHETERIZATION WITH CORONARY ANGIOGRAM N/A 12/05/2013   Procedure: LEFT HEART CATHETERIZATION WITH CORONARY ANGIOGRAM;  Surgeon: [name], MD;  Location: [facility] CATH LAB;  Service: Cardiovascular;  Laterality: N/A;  . LEFT HEART CATHETERIZATION WITH CORONARY ANGIOGRAM N/A 11/06/2014   Procedure: LEFT HEART CATHETERIZATION WITH CORONARY ANGIOGRAM;  Surgeon: [name], MD;  Location: [facility] CATH LAB;  Service: Cardiovascular;  Laterality: N/A;  . MASTECTOMY  07/29/11   bilateral-rt nodes-none on left  . SMALL INTESTINE SURGERY  1998   SBO  . TONSILLECTOMY    . TUBAL LIGATION  1961  . VEIN LIGATION AND STRIPPING Right   . VESICOVAGINAL FISTULA CLOSURE W/ TAH  age 12    FAMILY HISTORY Family History  Problem Relation Age of Onset  . Pancreatic cancer Mother   . Cancer Mother        pancreatic  . Asthma Daughter   . Stroke Daughter   . Hypertension Daughter   . Heart disease Father   . Emphysema Father   . Lymphoma Brother   . Asthma Brother   . Asthma Grandchild   . Asthma Brother        deceased  . Heart disease Brother    the patient's father died at the age of 35 from heart disease. He was a smoker.  The patient's mother died at the age of 46 with cancer of the pancreas. The patient has 3 brothers, no sisters. There is no history of breast or ovarian cancer in the family to her knowledge.  GYNECOLOGIC HISTORY:  No LMP recorded. Patient has had a hysterectomy. Menarche age 66, first live birth age 65. She is GX P4. She stopped having periods in her 64s. She took hormone replacement for approximately 10 years. 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 She has been "single" for 40 years. Her oldest daughter died from a subarachnoid hemorrhage at age 21, 2 years ago. This is when the patient had undergone treatment for invasive breast cancer and she tells me she was simply overwhelmed and could not start the planned antiestrogen therapy. The next daughter, [name], is a retired Software engineer. She lives in [place]. Next child, Freda Munro, works in Charity fundraiser. She also lives in  [place]. The youngest, [name], is retired from the Office manager position. He also owned his own business. The patient has 7 grandchildren and 2 many great-grandchildren to count. She attends the [employer]    ADVANCED DIRECTIVES: In place; the patient's daughter Freda Munro is her healthcare power of attorney. She can be reached at [place]: Social History  Substance Use Topics  . Smoking status: Never Smoker  . Smokeless tobacco: Never Used  . Alcohol use Yes     Comment: occ wine     Colonoscopy:  PAP:  Bone density: August 2015   Lipid panel:  Allergies  Allergen Reactions  . Adhesive [Tape] Other (See Comments)    Pt prefers to use paper tape  . Penicillins Rash    Has patient had a PCN reaction causing immediate rash, facial/tongue/throat swelling, SOB or lightheadedness with hypotension: Yes Has patient had a PCN reaction causing severe rash involving mucus  membranes or skin necrosis: No Has patient had a PCN reaction that required hospitalization No Has patient had a PCN reaction occurring within the last 10 years: No If all of the above answers are "NO", then may proceed with Cephalosporin use.     Current Outpatient Prescriptions  Medication Sig Dispense Refill  . albuterol (PROVENTIL HFA;VENTOLIN HFA) 108 (90 Base) MCG/ACT inhaler Inhale 1-2 puffs into the lungs every 4 (four) hours as needed for wheezing or shortness of breath. 1 Inhaler 0  . Artificial Tear Ointment (DRY EYES OP) Place 2 drops into both eyes daily as needed (dry eyes).     [name] aspirin EC 81 MG tablet Take 2 tablets (162 mg total) by mouth daily.    [name] atorvastatin (LIPITOR) 20 MG tablet Take 1 tablet (20 mg total) by mouth daily. 90 tablet 3  . B Complex-Biotin-FA (B-COMPLEX PO) Take 1 tablet by mouth daily.     . Biotin 5000 MCG CAPS Take 1 capsule (5,000 mcg total) by mouth daily. 90 capsule 3  . budesonide-formoterol (SYMBICORT) 160-4.5 MCG/ACT inhaler Inhale 2 puffs into the lungs 2 (two) times daily as needed. (Patient taking differently: Inhale 2 puffs into the lungs 2 (two) times daily as needed (wheezing, shortness of breath). ) 3 Inhaler 1  . Calcium-Magnesium-Zinc 1000-400-15 MG TABS Take 1 tablet by mouth daily.      . celecoxib (CELEBREX) 200 MG capsule Take 1 capsule daily with food for pain & inflammation (Patient taking differently: Take 200 mg by mouth daily. ) 90 capsule 3  . Cholecalciferol (VITAMIN D-3) 5000 UNITS TABS Take 1 tablet by mouth daily.     . citalopram (CELEXA) 40 MG tablet TAKE 1 TABLET BY MOUTH  DAILY. 90 tablet 0  . diazepam (VALIUM) 5 MG tablet TAKE ONE-HALF TO ONE TABLET BY MOUTH EVERY 8 HOURS AS NEEDED FOR ANXIETY 45 tablet 1  . diazepam (VALIUM) 5 MG tablet TAKE ONE-HALF TO ONE TABLET BY MOUTH EVERY 8 HOURS AS NEEDED FOR ANXIETY 45 tablet 0  . DULoxetine (CYMBALTA) 60 MG capsule Take 1 capsule by mouth  every day for mood 90 capsule 0  .  DULoxetine (CYMBALTA) 60 MG capsule TAKE 1 CAPSULE BY MOUTH  EVERY DAY FOR MOOD 90 capsule 0  . ezetimibe (ZETIA) 10 MG tablet Take 1 tablet (10 mg total) by mouth daily.    . Fish Oil OIL Take 1 capsule by mouth daily.     . Flaxseed, Linseed, (GROUND FLAX SEEDS PO) Take 5 mLs by mouth daily.     [name]  gabapentin (NEURONTIN) 100 MG capsule 1-2 po nightly (Patient taking differently: Take 100-200 mg by mouth at bedtime as needed (pain). 1-2 po nightly) 60 capsule 3  . isosorbide mononitrate (IMDUR) 30 MG 24 hr tablet Take 1 tablet (30 mg total) by mouth daily. 90 tablet 3  . montelukast (SINGULAIR) 10 MG tablet Take 1 tablet (10 mg total) by mouth at bedtime. 90 tablet 3  . Multiple Vitamin (MULTIVITAMIN) tablet Take 1 tablet by mouth daily.    . ondansetron (ZOFRAN ODT) 4 MG disintegrating tablet Take 1 tablet (4 mg total) by mouth every 8 (eight) hours as needed for nausea or vomiting. 20 tablet 0  . oxyCODONE-acetaminophen (PERCOCET/ROXICET) 5-325 MG tablet Take 1 tablet by mouth daily as needed for pain.     No current facility-administered medications for this visit.    Facility-Administered Medications Ordered in Other Visits  Medication Dose Route Frequency Provider Last Rate Last Dose  . denosumab (PROLIA) injection 60 mg  60 mg Subcutaneous Once Eleisha Branscomb, Virgie Dad, MD      . fulvestrant (FASLODEX) injection 500 mg  500 mg Intramuscular Once Mohogany Toppins, Virgie Dad, MD        OBJECTIVE: Elderly white woman Who appears younger than stated age  83:   02/09/17 1333  BP: 123/68  Pulse: 67  Resp: 19  Temp: 97.7 F (36.5 C)     Body mass index is 24.18 kg/m.    ECOG FS:1 - Symptomatic but completely ambulatory  Sclerae unicteric, EOMs intact Oropharynx clear and moist No cervical or supraclavicular adenopathy Lungs no rales or rhonchi Heart regular rate and rhythm Abd soft, nontender, positive bowel sounds MSK no focal spinal tenderness, no upper extremity lymphedema Neuro:  nonfocal, well oriented, appropriate affect Breasts: Status post bilateral mastectomies. No evidence of chest wall recurrence. Both axillae are benign.  LAB RESULTS:  CMP     Component Value Date/Time   NA 139 02/09/2017 1303   K 4.5 02/09/2017 1303   CL 102 09/29/2016 1222   CO2 29 02/09/2017 1303   GLUCOSE 94 02/09/2017 1303   BUN 14.7 02/09/2017 1303   CREATININE 0.8 02/09/2017 1303   CALCIUM 9.3 02/09/2017 1303   PROT 6.4 02/09/2017 1303   ALBUMIN 3.7 02/09/2017 1303   AST 19 02/09/2017 1303   ALT 10 02/09/2017 1303   ALKPHOS 40 02/09/2017 1303   BILITOT 0.42 02/09/2017 1303   GFRNONAA >60 09/29/2016 1222   GFRNONAA 73 05/01/2014 1050   GFRAA >60 09/29/2016 1222   GFRAA 84 05/01/2014 1050    I No results found for: SPEP  Lab Results  Component Value Date   WBC 5.8 02/09/2017   NEUTROABS 2.8 02/09/2017   HGB 12.8 02/09/2017   HCT 38.8 02/09/2017   MCV 90.7 02/09/2017   PLT 213 02/09/2017      Chemistry      Component Value Date/Time   NA 139 02/09/2017 1303   K 4.5 02/09/2017 1303   CL 102 09/29/2016 1222   CO2 29 02/09/2017 1303   BUN 14.7 02/09/2017 1303   CREATININE 0.8 02/09/2017 1303      Component Value Date/Time   CALCIUM 9.3 02/09/2017 1303   ALKPHOS 40 02/09/2017 1303   AST 19 02/09/2017 1303   ALT 10 02/09/2017 1303   BILITOT 0.42 02/09/2017 1303       Lab Results  Component Value Date   LABCA2 12 11/26/2015    No components found for: [id number]  No results for input(s): INR  in the last 168 hours.  Urinalysis    Component Value Date/Time   COLORURINE YELLOW 09/29/2016 1315   APPEARANCEUR CLEAR 09/29/2016 1315   LABSPEC 1.011 09/29/2016 1315   PHURINE 6.0 09/29/2016 1315   GLUCOSEU NEGATIVE 09/29/2016 1315   HGBUR NEGATIVE 09/29/2016 1315   BILIRUBINUR NEGATIVE 09/29/2016 1315   BILIRUBINUR neg 04/25/2015 1015   KETONESUR NEGATIVE 09/29/2016 1315   PROTEINUR NEGATIVE 09/29/2016 1315   UROBILINOGEN 0.2 04/25/2015 1015    UROBILINOGEN 0.2 02/14/2015 2010   NITRITE NEGATIVE 09/29/2016 1315   LEUKOCYTESUR NEGATIVE 09/29/2016 1315    STUDIES: Bone scan 10/27/2016 shows a left iliac wing area of increased uptake and a photopenic defect in the right femoral diaphysis. CT of the chest same month was negative ASSESSMENT: 83 y.o. [place] woman with locally recurrent breast cancer, as follows:  (1) status post right lumpectomy and sentinel node sampling September 2000 for ductal carcinoma in situ (Ni+), estrogen and progesterone receptor positive, with a low proliferation index,  (a) received 5040 cGy to the right breast and regional lymph nodes  (b) on tamoxifen less than 3 months  (2) s/p bilateral mastectomies and right axillary lymph node sampling 07/29/2011, showing  (a) in the left breast, ductal carcinoma in situ measuring 1.2 cm, grade 1, with negative margins, estrogen and progesterone receptor positive  (b)  on the right, a pT1c pN0, stage IA invasive ductal carcinoma, estrogen receptor and 93% positive, progesterone receptor 87% positive, with an MIB-1 of 35%, and no HER-2 amplification margins were close but negative  (c) did not receive adjuvant antiestrogen therapy  (3) right chest wall skin recurrence resected 01/21/2014, measuring 1.7 cm, with positive margins, estrogen receptor 100% positive, progesterone receptor 75% positive, with an MIB-1 of 80%, and no HER-2 amplification; PET scan 02/15/2014 negative except for Right chest wall focus  (a) received 45 Gy electron-beam therapy to the right chest wall completed 03/27/2014  (b) skin progression within and without the radiation field noted 04/30/2014  (c) letrozole started 05/13/2014, discontinued March 2016 with progression   (4) dexa scan 05/09/2014 at [facility] shows osteoporosis with a T score of -2.9  (5) Fulvestrant started 12/25/2014  (6) osteopenia, on DEXA scan 05/09/2014  (a) started Denosumab/prolia 08/06/2015, repeated every  24 weeks  (7) Chronic pain: Followed by Dr. Nelva Bush  PLAN: I spent approximately 30 minutes with Angel [name] with most of that time spent discussing her multiple problems. From a breast cancer point of view however she is generally doing well. We are going to continue the fulvestrant and the Prolia on a regular basis. She has a Prolia dose due today and the next one will be in November.  Sindhu tells me that she throughout her Valium bottle inadvertently. I have really no reason to doubt this. I have checked the [place] controlled substances database and there is no irregularity there she gets all her medications from the same pharmacy and she gets all her narcotics from Dr. Dossie Der. She gets the diazepam from [place].  I did tell her that if this were to happen in the future we would not be able to refill it for her. She understands that.  We discussed her diet in detail. I think she is doing quite well with that. I encouraged her to continue her walking exercise.  She is now 3 years out from definitive evidence of her chest wall recurrence. She has an excellent functional status. I will repeat a bone scan before her November visit.  She knows to call for any problems that may develop before then.  [name], MD   02/09/2017 1:53 PM a

## 2017-02-09 NOTE — Patient Instructions (Signed)
Fulvestrant injection What is this medicine? FULVESTRANT (ful VES trant) blocks the effects of estrogen. It is used to treat breast cancer. This medicine may be used for other purposes; ask your health care provider or pharmacist if you have questions. COMMON BRAND NAME(S): FASLODEX What should I tell my health care provider before I take this medicine? They need to know if you have any of these conditions: -bleeding problems -liver disease -low levels of platelets in the blood -an unusual or allergic reaction to fulvestrant, other medicines, foods, dyes, or preservatives -pregnant or trying to get pregnant -breast-feeding How should I use this medicine? This medicine is for injection into a muscle. It is usually given by a health care professional in a hospital or clinic setting. Talk to your pediatrician regarding the use of this medicine in children. Special care may be needed. Overdosage: If you think you have taken too much of this medicine contact a poison control center or emergency room at once. NOTE: This medicine is only for you. Do not share this medicine with others. What if I miss a dose? It is important not to miss your dose. Call your doctor or health care professional if you are unable to keep an appointment. What may interact with this medicine? -medicines that treat or prevent blood clots like warfarin, enoxaparin, and dalteparin This list may not describe all possible interactions. Give your health care provider a list of all the medicines, herbs, non-prescription drugs, or dietary supplements you use. Also tell them if you smoke, drink alcohol, or use illegal drugs. Some items may interact with your medicine. What should I watch for while using this medicine? Your condition will be monitored carefully while you are receiving this medicine. You will need important blood work done while you are taking this medicine. Do not become pregnant while taking this medicine or for  at least 1 year after stopping it. Women of child-bearing potential will need to have a negative pregnancy test before starting this medicine. Women should inform their doctor if they wish to become pregnant or think they might be pregnant. There is a potential for serious side effects to an unborn child. Men should inform their doctors if they wish to father a child. This medicine may lower sperm counts. Talk to your health care professional or pharmacist for more information. Do not breast-feed an infant while taking this medicine or for 1 year after the last dose. What side effects may I notice from receiving this medicine? Side effects that you should report to your doctor or health care professional as soon as possible: -allergic reactions like skin rash, itching or hives, swelling of the face, lips, or tongue -feeling faint or lightheaded, falls -pain, tingling, numbness, or weakness in the legs -signs and symptoms of infection like fever or chills; cough; flu-like symptoms; sore throat -vaginal bleeding Side effects that usually do not require medical attention (report to your doctor or health care professional if they continue or are bothersome): -aches, pains -constipation -diarrhea -headache -hot flashes -nausea, vomiting -pain at site where injected -stomach pain This list may not describe all possible side effects. Call your doctor for medical advice about side effects. You may report side effects to FDA at 1-800-FDA-1088. Where should I keep my medicine? This drug is given in a hospital or clinic and will not be stored at home. NOTE: This sheet is a summary. It may not cover all possible information. If you have questions about this medicine, talk to your   doctor, pharmacist, or health care provider.  2018 Elsevier/Gold Standard (2015-04-11 11:03:55) Denosumab injection What is this medicine? DENOSUMAB (den oh sue mab) slows bone breakdown. Prolia is used to treat osteoporosis in  women after menopause and in men. Delton See is used to treat a high calcium level due to cancer and to prevent bone fractures and other bone problems caused by multiple myeloma or cancer bone metastases. Delton See is also used to treat giant cell tumor of the bone. This medicine may be used for other purposes; ask your health care provider or pharmacist if you have questions. COMMON BRAND NAME(S): Prolia, XGEVA What should I tell my health care provider before I take this medicine? They need to know if you have any of these conditions: -dental disease -having surgery or tooth extraction -infection -kidney disease -low levels of calcium or Vitamin D in the blood -malnutrition -on hemodialysis -skin conditions or sensitivity -thyroid or parathyroid disease -an unusual reaction to denosumab, other medicines, foods, dyes, or preservatives -pregnant or trying to get pregnant -breast-feeding How should I use this medicine? This medicine is for injection under the skin. It is given by a health care professional in a hospital or clinic setting. If you are getting Prolia, a special MedGuide will be given to you by the pharmacist with each prescription and refill. Be sure to read this information carefully each time. For Prolia, talk to your pediatrician regarding the use of this medicine in children. Special care may be needed. For Delton See, talk to your pediatrician regarding the use of this medicine in children. While this drug may be prescribed for children as young as 13 years for selected conditions, precautions do apply. Overdosage: If you think you have taken too much of this medicine contact a poison control center or emergency room at once. NOTE: This medicine is only for you. Do not share this medicine with others. What if I miss a dose? It is important not to miss your dose. Call your doctor or health care professional if you are unable to keep an appointment. What may interact with this  medicine? Do not take this medicine with any of the following medications: -other medicines containing denosumab This medicine may also interact with the following medications: -medicines that lower your chance of fighting infection -steroid medicines like prednisone or cortisone This list may not describe all possible interactions. Give your health care provider a list of all the medicines, herbs, non-prescription drugs, or dietary supplements you use. Also tell them if you smoke, drink alcohol, or use illegal drugs. Some items may interact with your medicine. What should I watch for while using this medicine? Visit your doctor or health care professional for regular checks on your progress. Your doctor or health care professional may order blood tests and other tests to see how you are doing. Call your doctor or health care professional for advice if you get a fever, chills or sore throat, or other symptoms of a cold or flu. Do not treat yourself. This drug may decrease your body's ability to fight infection. Try to avoid being around people who are sick. You should make sure you get enough calcium and vitamin D while you are taking this medicine, unless your doctor tells you not to. Discuss the foods you eat and the vitamins you take with your health care professional. See your dentist regularly. Brush and floss your teeth as directed. Before you have any dental work done, tell your dentist you are receiving this medicine. Do  not become pregnant while taking this medicine or for 5 months after stopping it. Talk with your doctor or health care professional about your birth control options while taking this medicine. Women should inform their doctor if they wish to become pregnant or think they might be pregnant. There is a potential for serious side effects to an unborn child. Talk to your health care professional or pharmacist for more information. What side effects may I notice from receiving this  medicine? Side effects that you should report to your doctor or health care professional as soon as possible: -allergic reactions like skin rash, itching or hives, swelling of the face, lips, or tongue -bone pain -breathing problems -dizziness -jaw pain, especially after dental work -redness, blistering, peeling of the skin -signs and symptoms of infection like fever or chills; cough; sore throat; pain or trouble passing urine -signs of low calcium like fast heartbeat, muscle cramps or muscle pain; pain, tingling, numbness in the hands or feet; seizures -unusual bleeding or bruising -unusually weak or tired Side effects that usually do not require medical attention (report to your doctor or health care professional if they continue or are bothersome): -constipation -diarrhea -headache -joint pain -loss of appetite -muscle pain -runny nose -tiredness -upset stomach This list may not describe all possible side effects. Call your doctor for medical advice about side effects. You may report side effects to FDA at 1-800-FDA-1088. Where should I keep my medicine? This medicine is only given in a clinic, doctor's office, or other health care setting and will not be stored at home. NOTE: This sheet is a summary. It may not cover all possible information. If you have questions about this medicine, talk to your doctor, pharmacist, or health care provider.  2018 Elsevier/Gold Standard (2016-10-05 19:17:21)  

## 2017-02-10 ENCOUNTER — Ambulatory Visit: Payer: Medicare Other | Admitting: Oncology

## 2017-02-10 ENCOUNTER — Other Ambulatory Visit: Payer: Medicare Other

## 2017-02-10 ENCOUNTER — Ambulatory Visit: Payer: Medicare Other

## 2017-03-03 DIAGNOSIS — M545 Low back pain: Secondary | ICD-10-CM | POA: Diagnosis not present

## 2017-03-09 ENCOUNTER — Ambulatory Visit (HOSPITAL_BASED_OUTPATIENT_CLINIC_OR_DEPARTMENT_OTHER): Payer: Medicare Other

## 2017-03-09 ENCOUNTER — Other Ambulatory Visit (HOSPITAL_BASED_OUTPATIENT_CLINIC_OR_DEPARTMENT_OTHER): Payer: Medicare Other

## 2017-03-09 VITALS — BP 124/73 | HR 73 | Temp 97.2°F | Resp 18

## 2017-03-09 DIAGNOSIS — C50911 Malignant neoplasm of unspecified site of right female breast: Secondary | ICD-10-CM

## 2017-03-09 DIAGNOSIS — M818 Other osteoporosis without current pathological fracture: Secondary | ICD-10-CM

## 2017-03-09 DIAGNOSIS — C792 Secondary malignant neoplasm of skin: Secondary | ICD-10-CM

## 2017-03-09 DIAGNOSIS — Z5111 Encounter for antineoplastic chemotherapy: Secondary | ICD-10-CM

## 2017-03-09 LAB — COMPREHENSIVE METABOLIC PANEL
ALT: 12 U/L (ref 0–55)
AST: 20 U/L (ref 5–34)
Albumin: 3.7 g/dL (ref 3.5–5.0)
Alkaline Phosphatase: 39 U/L — ABNORMAL LOW (ref 40–150)
Anion Gap: 7 mEq/L (ref 3–11)
BUN: 11.3 mg/dL (ref 7.0–26.0)
CALCIUM: 9.4 mg/dL (ref 8.4–10.4)
CHLORIDE: 101 meq/L (ref 98–109)
CO2: 28 mEq/L (ref 22–29)
CREATININE: 0.9 mg/dL (ref 0.6–1.1)
EGFR: 59 mL/min/{1.73_m2} — ABNORMAL LOW (ref >90)
Glucose: 89 mg/dl (ref 70–140)
Potassium: 4.6 mEq/L (ref 3.5–5.1)
Sodium: 136 mEq/L (ref 136–145)
TOTAL PROTEIN: 6.3 g/dL — AB (ref 6.4–8.3)
Total Bilirubin: 0.31 mg/dL (ref 0.20–1.20)

## 2017-03-09 LAB — CBC WITH DIFFERENTIAL/PLATELET
BASO%: 0.3 % (ref 0.0–2.0)
Basophils Absolute: 0 10*3/uL (ref 0.0–0.1)
EOS%: 1.3 % (ref 0.0–7.0)
Eosinophils Absolute: 0.1 10*3/uL (ref 0.0–0.5)
HCT: 36.3 % (ref 34.8–46.6)
HEMOGLOBIN: 11.8 g/dL (ref 11.6–15.9)
LYMPH#: 2.2 10*3/uL (ref 0.9–3.3)
LYMPH%: 35.3 % (ref 14.0–49.7)
MCH: 29.9 pg (ref 25.1–34.0)
MCHC: 32.5 g/dL (ref 31.5–36.0)
MCV: 92.1 fL (ref 79.5–101.0)
MONO#: 0.5 10*3/uL (ref 0.1–0.9)
MONO%: 8.2 % (ref 0.0–14.0)
NEUT#: 3.5 10*3/uL (ref 1.5–6.5)
NEUT%: 54.9 % (ref 38.4–76.8)
Platelets: 216 10*3/uL (ref 145–400)
RBC: 3.94 10*6/uL (ref 3.70–5.45)
RDW: 13.1 % (ref 11.2–14.5)
WBC: 6.3 10*3/uL (ref 3.9–10.3)

## 2017-03-09 MED ORDER — FULVESTRANT 250 MG/5ML IM SOLN
500.0000 mg | Freq: Once | INTRAMUSCULAR | Status: AC
  Administered 2017-03-09: 500 mg via INTRAMUSCULAR
  Filled 2017-03-09: qty 10

## 2017-03-09 NOTE — Patient Instructions (Signed)

## 2017-03-10 DIAGNOSIS — M25561 Pain in right knee: Secondary | ICD-10-CM | POA: Diagnosis not present

## 2017-03-10 DIAGNOSIS — M25562 Pain in left knee: Secondary | ICD-10-CM | POA: Diagnosis not present

## 2017-03-10 DIAGNOSIS — M5441 Lumbago with sciatica, right side: Secondary | ICD-10-CM | POA: Diagnosis not present

## 2017-03-11 ENCOUNTER — Telehealth: Payer: Self-pay

## 2017-03-11 ENCOUNTER — Other Ambulatory Visit: Payer: Self-pay | Admitting: Oncology

## 2017-03-11 MED ORDER — DIAZEPAM 5 MG PO TABS: 5.0000 mg | ORAL_TABLET | Freq: Four times a day (QID) | ORAL | 1 refills | Status: DC | PRN

## 2017-03-11 NOTE — Telephone Encounter (Signed)
Cathie Hoops called for valium refill for pt. rx prepared. rx called in to [place]

## 2017-03-18 DIAGNOSIS — M5441 Lumbago with sciatica, right side: Secondary | ICD-10-CM | POA: Diagnosis not present

## 2017-03-31 DIAGNOSIS — M5441 Lumbago with sciatica, right side: Secondary | ICD-10-CM | POA: Diagnosis not present

## 2017-03-31 DIAGNOSIS — M5136 Other intervertebral disc degeneration, lumbar region: Secondary | ICD-10-CM | POA: Diagnosis not present

## 2017-04-06 ENCOUNTER — Other Ambulatory Visit (HOSPITAL_BASED_OUTPATIENT_CLINIC_OR_DEPARTMENT_OTHER): Payer: Medicare Other

## 2017-04-06 ENCOUNTER — Ambulatory Visit (HOSPITAL_BASED_OUTPATIENT_CLINIC_OR_DEPARTMENT_OTHER): Payer: Medicare Other

## 2017-04-06 VITALS — BP 128/66 | HR 66 | Temp 97.9°F | Resp 20

## 2017-04-06 DIAGNOSIS — C50911 Malignant neoplasm of unspecified site of right female breast: Secondary | ICD-10-CM

## 2017-04-06 DIAGNOSIS — C7989 Secondary malignant neoplasm of other specified sites: Secondary | ICD-10-CM | POA: Diagnosis not present

## 2017-04-06 DIAGNOSIS — Z5111 Encounter for antineoplastic chemotherapy: Secondary | ICD-10-CM

## 2017-04-06 DIAGNOSIS — M818 Other osteoporosis without current pathological fracture: Secondary | ICD-10-CM

## 2017-04-06 LAB — COMPREHENSIVE METABOLIC PANEL
ALBUMIN: 4 g/dL (ref 3.5–5.0)
ALK PHOS: 43 U/L (ref 40–150)
ALT: 10 U/L (ref 0–55)
ANION GAP: 9 meq/L (ref 3–11)
AST: 17 U/L (ref 5–34)
BILIRUBIN TOTAL: 0.37 mg/dL (ref 0.20–1.20)
BUN: 15.3 mg/dL (ref 7.0–26.0)
CALCIUM: 10 mg/dL (ref 8.4–10.4)
CO2: 29 mEq/L (ref 22–29)
Chloride: 101 mEq/L (ref 98–109)
Creatinine: 0.8 mg/dL (ref 0.6–1.1)
EGFR: 65 mL/min/{1.73_m2} — AB (ref >90)
GLUCOSE: 91 mg/dL (ref 70–140)
Potassium: 4.8 mEq/L (ref 3.5–5.1)
Sodium: 139 mEq/L (ref 136–145)
TOTAL PROTEIN: 7 g/dL (ref 6.4–8.3)

## 2017-04-06 LAB — CBC WITH DIFFERENTIAL/PLATELET
BASO%: 1.1 % (ref 0.0–2.0)
Basophils Absolute: 0.1 10*3/uL (ref 0.0–0.1)
EOS ABS: 0 10*3/uL (ref 0.0–0.5)
EOS%: 0.8 % (ref 0.0–7.0)
HEMATOCRIT: 40.7 % (ref 34.8–46.6)
HEMOGLOBIN: 13.4 g/dL (ref 11.6–15.9)
LYMPH%: 38.9 % (ref 14.0–49.7)
MCH: 30.3 pg (ref 25.1–34.0)
MCHC: 33.1 g/dL (ref 31.5–36.0)
MCV: 91.6 fL (ref 79.5–101.0)
MONO#: 0.4 10*3/uL (ref 0.1–0.9)
MONO%: 8.8 % (ref 0.0–14.0)
NEUT%: 50.4 % (ref 38.4–76.8)
NEUTROS ABS: 2.5 10*3/uL (ref 1.5–6.5)
PLATELETS: 216 10*3/uL (ref 145–400)
RBC: 4.44 10*6/uL (ref 3.70–5.45)
RDW: 13.1 % (ref 11.2–14.5)
WBC: 4.9 10*3/uL (ref 3.9–10.3)
lymph#: 1.9 10*3/uL (ref 0.9–3.3)

## 2017-04-06 MED ORDER — FULVESTRANT 250 MG/5ML IM SOLN
500.0000 mg | Freq: Once | INTRAMUSCULAR | Status: AC
  Administered 2017-04-06: 500 mg via INTRAMUSCULAR
  Filled 2017-04-06: qty 10

## 2017-04-06 NOTE — Patient Instructions (Signed)

## 2017-05-04 ENCOUNTER — Other Ambulatory Visit (HOSPITAL_BASED_OUTPATIENT_CLINIC_OR_DEPARTMENT_OTHER): Payer: Medicare Other

## 2017-05-04 ENCOUNTER — Ambulatory Visit (HOSPITAL_BASED_OUTPATIENT_CLINIC_OR_DEPARTMENT_OTHER): Payer: Medicare Other

## 2017-05-04 VITALS — BP 110/84 | HR 72 | Temp 97.8°F | Resp 18

## 2017-05-04 DIAGNOSIS — C50911 Malignant neoplasm of unspecified site of right female breast: Secondary | ICD-10-CM | POA: Diagnosis not present

## 2017-05-04 DIAGNOSIS — Z5111 Encounter for antineoplastic chemotherapy: Secondary | ICD-10-CM | POA: Diagnosis not present

## 2017-05-04 DIAGNOSIS — C7989 Secondary malignant neoplasm of other specified sites: Secondary | ICD-10-CM | POA: Diagnosis not present

## 2017-05-04 DIAGNOSIS — M818 Other osteoporosis without current pathological fracture: Secondary | ICD-10-CM

## 2017-05-04 LAB — CBC WITH DIFFERENTIAL/PLATELET
BASO%: 0.7 % (ref 0.0–2.0)
BASOS ABS: 0 10*3/uL (ref 0.0–0.1)
EOS%: 1.8 % (ref 0.0–7.0)
Eosinophils Absolute: 0.1 10*3/uL (ref 0.0–0.5)
HCT: 39.5 % (ref 34.8–46.6)
HGB: 13 g/dL (ref 11.6–15.9)
LYMPH%: 40.5 % (ref 14.0–49.7)
MCH: 30.1 pg (ref 25.1–34.0)
MCHC: 32.9 g/dL (ref 31.5–36.0)
MCV: 91.5 fL (ref 79.5–101.0)
MONO#: 0.4 10*3/uL (ref 0.1–0.9)
MONO%: 8.5 % (ref 0.0–14.0)
NEUT#: 2.5 10*3/uL (ref 1.5–6.5)
NEUT%: 48.5 % (ref 38.4–76.8)
Platelets: 206 10*3/uL (ref 145–400)
RBC: 4.32 10*6/uL (ref 3.70–5.45)
RDW: 13.2 % (ref 11.2–14.5)
WBC: 5.2 10*3/uL (ref 3.9–10.3)
lymph#: 2.1 10*3/uL (ref 0.9–3.3)

## 2017-05-04 LAB — COMPREHENSIVE METABOLIC PANEL
ALT: 12 U/L (ref 0–55)
AST: 20 U/L (ref 5–34)
Albumin: 3.6 g/dL (ref 3.5–5.0)
Alkaline Phosphatase: 41 U/L (ref 40–150)
Anion Gap: 7 mEq/L (ref 3–11)
BUN: 13.8 mg/dL (ref 7.0–26.0)
CALCIUM: 9.5 mg/dL (ref 8.4–10.4)
CHLORIDE: 102 meq/L (ref 98–109)
CO2: 30 mEq/L — ABNORMAL HIGH (ref 22–29)
Creatinine: 0.8 mg/dL (ref 0.6–1.1)
EGFR: 72 mL/min/{1.73_m2} — AB (ref >90)
GLUCOSE: 78 mg/dL (ref 70–140)
POTASSIUM: 4.2 meq/L (ref 3.5–5.1)
SODIUM: 139 meq/L (ref 136–145)
Total Bilirubin: 0.4 mg/dL (ref 0.20–1.20)
Total Protein: 6.6 g/dL (ref 6.4–8.3)

## 2017-05-04 MED ORDER — FULVESTRANT 250 MG/5ML IM SOLN
500.0000 mg | Freq: Once | INTRAMUSCULAR | Status: AC
  Administered 2017-05-04: 500 mg via INTRAMUSCULAR
  Filled 2017-05-04: qty 10

## 2017-05-04 NOTE — Patient Instructions (Signed)

## 2017-05-14 ENCOUNTER — Other Ambulatory Visit: Payer: Self-pay | Admitting: Family Medicine

## 2017-05-14 DIAGNOSIS — I2510 Atherosclerotic heart disease of native coronary artery without angina pectoris: Secondary | ICD-10-CM

## 2017-05-14 DIAGNOSIS — F32A Depression, unspecified: Secondary | ICD-10-CM

## 2017-05-14 DIAGNOSIS — J4520 Mild intermittent asthma, uncomplicated: Secondary | ICD-10-CM

## 2017-05-14 DIAGNOSIS — F329 Major depressive disorder, single episode, unspecified: Secondary | ICD-10-CM

## 2017-05-14 DIAGNOSIS — G894 Chronic pain syndrome: Secondary | ICD-10-CM

## 2017-05-17 DIAGNOSIS — H2512 Age-related nuclear cataract, left eye: Secondary | ICD-10-CM | POA: Diagnosis not present

## 2017-05-17 DIAGNOSIS — H2511 Age-related nuclear cataract, right eye: Secondary | ICD-10-CM | POA: Diagnosis not present

## 2017-05-17 DIAGNOSIS — Z9841 Cataract extraction status, right eye: Secondary | ICD-10-CM | POA: Diagnosis not present

## 2017-05-17 DIAGNOSIS — H35371 Puckering of macula, right eye: Secondary | ICD-10-CM | POA: Diagnosis not present

## 2017-05-17 DIAGNOSIS — H179 Unspecified corneal scar and opacity: Secondary | ICD-10-CM | POA: Diagnosis not present

## 2017-05-27 DIAGNOSIS — M1711 Unilateral primary osteoarthritis, right knee: Secondary | ICD-10-CM | POA: Diagnosis not present

## 2017-06-01 ENCOUNTER — Ambulatory Visit (HOSPITAL_BASED_OUTPATIENT_CLINIC_OR_DEPARTMENT_OTHER): Payer: Medicare Other

## 2017-06-01 ENCOUNTER — Other Ambulatory Visit: Payer: Self-pay

## 2017-06-01 VITALS — BP 124/65 | HR 78 | Temp 97.6°F | Resp 20

## 2017-06-01 DIAGNOSIS — C7989 Secondary malignant neoplasm of other specified sites: Secondary | ICD-10-CM | POA: Diagnosis not present

## 2017-06-01 DIAGNOSIS — Z5111 Encounter for antineoplastic chemotherapy: Secondary | ICD-10-CM

## 2017-06-01 DIAGNOSIS — M818 Other osteoporosis without current pathological fracture: Secondary | ICD-10-CM

## 2017-06-01 DIAGNOSIS — C50911 Malignant neoplasm of unspecified site of right female breast: Secondary | ICD-10-CM

## 2017-06-01 DIAGNOSIS — G8929 Other chronic pain: Secondary | ICD-10-CM

## 2017-06-01 MED ORDER — FULVESTRANT 250 MG/5ML IM SOLN
500.0000 mg | Freq: Once | INTRAMUSCULAR | Status: AC
  Administered 2017-06-01: 500 mg via INTRAMUSCULAR
  Filled 2017-06-01: qty 10

## 2017-06-01 MED ORDER — GABAPENTIN 100 MG PO CAPS: ORAL_CAPSULE | ORAL | 3 refills | Status: DC

## 2017-06-01 NOTE — Patient Instructions (Signed)

## 2017-06-08 ENCOUNTER — Telehealth: Payer: Self-pay

## 2017-06-08 ENCOUNTER — Other Ambulatory Visit: Payer: Self-pay | Admitting: Oncology

## 2017-06-08 NOTE — Telephone Encounter (Signed)
Diazepam refill through in basket

## 2017-06-16 ENCOUNTER — Other Ambulatory Visit: Payer: Self-pay | Admitting: Cardiology

## 2017-06-16 DIAGNOSIS — E785 Hyperlipidemia, unspecified: Secondary | ICD-10-CM

## 2017-06-29 ENCOUNTER — Other Ambulatory Visit (HOSPITAL_BASED_OUTPATIENT_CLINIC_OR_DEPARTMENT_OTHER): Payer: Medicare Other

## 2017-06-29 ENCOUNTER — Ambulatory Visit (HOSPITAL_BASED_OUTPATIENT_CLINIC_OR_DEPARTMENT_OTHER): Payer: Medicare Other

## 2017-06-29 DIAGNOSIS — C50911 Malignant neoplasm of unspecified site of right female breast: Secondary | ICD-10-CM

## 2017-06-29 DIAGNOSIS — Z5111 Encounter for antineoplastic chemotherapy: Secondary | ICD-10-CM | POA: Diagnosis not present

## 2017-06-29 DIAGNOSIS — C7989 Secondary malignant neoplasm of other specified sites: Secondary | ICD-10-CM

## 2017-06-29 DIAGNOSIS — M818 Other osteoporosis without current pathological fracture: Secondary | ICD-10-CM

## 2017-06-29 LAB — COMPREHENSIVE METABOLIC PANEL
AST: 15 U/L (ref 5–34)
Albumin: 3.7 g/dL (ref 3.5–5.0)
Alkaline Phosphatase: 37 U/L — ABNORMAL LOW (ref 40–150)
Anion Gap: 7 mEq/L (ref 3–11)
BUN: 12.7 mg/dL (ref 7.0–26.0)
CHLORIDE: 99 meq/L (ref 98–109)
CO2: 28 meq/L (ref 22–29)
CREATININE: 0.8 mg/dL (ref 0.6–1.1)
Calcium: 9.3 mg/dL (ref 8.4–10.4)
EGFR: 69 mL/min/{1.73_m2} — ABNORMAL LOW (ref >90)
Glucose: 85 mg/dl (ref 70–140)
Potassium: 4.6 mEq/L (ref 3.5–5.1)
Sodium: 134 mEq/L — ABNORMAL LOW (ref 136–145)
Total Bilirubin: 0.25 mg/dL (ref 0.20–1.20)
Total Protein: 6.6 g/dL (ref 6.4–8.3)

## 2017-06-29 LAB — CBC WITH DIFFERENTIAL/PLATELET
BASO%: 0.9 % (ref 0.0–2.0)
Basophils Absolute: 0 10*3/uL (ref 0.0–0.1)
EOS%: 2 % (ref 0.0–7.0)
Eosinophils Absolute: 0.1 10*3/uL (ref 0.0–0.5)
HCT: 38.4 % (ref 34.8–46.6)
HGB: 12.6 g/dL (ref 11.6–15.9)
LYMPH#: 2.1 10*3/uL (ref 0.9–3.3)
LYMPH%: 42 % (ref 14.0–49.7)
MCH: 29.9 pg (ref 25.1–34.0)
MCHC: 32.8 g/dL (ref 31.5–36.0)
MCV: 91.2 fL (ref 79.5–101.0)
MONO#: 0.4 10*3/uL (ref 0.1–0.9)
MONO%: 7.6 % (ref 0.0–14.0)
NEUT#: 2.4 10*3/uL (ref 1.5–6.5)
NEUT%: 47.5 % (ref 38.4–76.8)
Platelets: 212 10*3/uL (ref 145–400)
RBC: 4.21 10*6/uL (ref 3.70–5.45)
RDW: 13.2 % (ref 11.2–14.5)
WBC: 5 10*3/uL (ref 3.9–10.3)

## 2017-06-29 MED ORDER — FULVESTRANT 250 MG/5ML IM SOLN
500.0000 mg | Freq: Once | INTRAMUSCULAR | Status: AC
  Administered 2017-06-29: 500 mg via INTRAMUSCULAR
  Filled 2017-06-29: qty 10

## 2017-06-29 NOTE — Patient Instructions (Signed)

## 2017-06-30 DIAGNOSIS — M542 Cervicalgia: Secondary | ICD-10-CM | POA: Diagnosis not present

## 2017-06-30 DIAGNOSIS — M545 Low back pain: Secondary | ICD-10-CM | POA: Diagnosis not present

## 2017-07-19 ENCOUNTER — Telehealth: Payer: Self-pay | Admitting: Cardiology

## 2017-07-19 ENCOUNTER — Encounter: Payer: Self-pay | Admitting: Cardiology

## 2017-07-19 NOTE — Telephone Encounter (Signed)
Called Anne at [facility]. She wanted to clarify if patient was taking Atorvastatin. According to Dr. Jerrye Bushy last office visit note (05/17/16), patient was to start Atorvastatin 20 mg by mouth daily. Patient was suppose to follow up with Dr. Radford Pax in one year. Our office sent patient a recall letter, that instructs patient to call our office for an appointment. Patient has not call to schedule an appointment. Patient has Atorvastatin on her medication list. There is a notes to pharmacy attached to her Atorvastatin medication that states, "Please call our office to schedule an overdue yearly appointment with Dr. Radford Pax before anymore refills. [phone]. Thank you 1st attempt."  Informed Ann at [facility] that patient is overdue for an appointment with Dr. Radford Pax, and that the Atorvastatin is on the patient's medication list.   Tried to call patient at both numbers listed to discuss issues of scheduling an appointment, and see if she has been taking Atorvastatin. No answers at either number and no voicemail to leave a message. Called patient's daughter Freda Munro [facility]), left message for her to call back.

## 2017-07-19 NOTE — Telephone Encounter (Signed)
Angel [name] [facility] ) is calling because she was doing a medication assessment on the Atorvastatin 20 mgs and [name] has not been taking this medication since May and is wanting to know should she be taking this medication . Please call

## 2017-07-19 NOTE — Telephone Encounter (Signed)
Angel [name] called back about her mother. Angel [name] stated, that she thinks her mother is still taking Lipitor. Will on the phone with [facility], an appointment was made for patient to follow up with Dr. Radford Pax on 10/07/16. Angel [name] will check back with our office when she goes and visits her mother this week if patient is not taking Lipitor. Shellee Milo for calling our office back, and encouraged her to call our office for any other questions or concerns.

## 2017-07-20 ENCOUNTER — Telehealth: Payer: Self-pay | Admitting: *Deleted

## 2017-07-20 NOTE — Telephone Encounter (Signed)
Per request by lab - this RN reviewed chart regarding pt's concern for lab sticks with known bilateral breast surgery.  Per chart review pt had bilateral mastectomies in 2012 with lymph node dissection on RIGHT only-  NO lymph nodes were removed on LEFT.  Note put on chart stating to use LEFT ARM FOR IV STICKS AND BP.

## 2017-07-27 ENCOUNTER — Ambulatory Visit (HOSPITAL_BASED_OUTPATIENT_CLINIC_OR_DEPARTMENT_OTHER): Payer: Medicare Other

## 2017-07-27 ENCOUNTER — Other Ambulatory Visit (HOSPITAL_BASED_OUTPATIENT_CLINIC_OR_DEPARTMENT_OTHER): Payer: Medicare Other

## 2017-07-27 VITALS — BP 109/83 | HR 66 | Temp 96.4°F | Resp 18

## 2017-07-27 DIAGNOSIS — M810 Age-related osteoporosis without current pathological fracture: Secondary | ICD-10-CM

## 2017-07-27 DIAGNOSIS — Z170 Estrogen receptor positive status [ER+]: Secondary | ICD-10-CM

## 2017-07-27 DIAGNOSIS — Z5111 Encounter for antineoplastic chemotherapy: Secondary | ICD-10-CM | POA: Diagnosis not present

## 2017-07-27 DIAGNOSIS — C50911 Malignant neoplasm of unspecified site of right female breast: Secondary | ICD-10-CM | POA: Diagnosis not present

## 2017-07-27 DIAGNOSIS — M818 Other osteoporosis without current pathological fracture: Secondary | ICD-10-CM

## 2017-07-27 LAB — CBC WITH DIFFERENTIAL/PLATELET
BASO%: 1.3 % (ref 0.0–2.0)
BASOS ABS: 0.1 10*3/uL (ref 0.0–0.1)
EOS ABS: 0.2 10*3/uL (ref 0.0–0.5)
EOS%: 4.7 % (ref 0.0–7.0)
HCT: 37.7 % (ref 34.8–46.6)
HEMOGLOBIN: 12.2 g/dL (ref 11.6–15.9)
LYMPH#: 2.4 10*3/uL (ref 0.9–3.3)
LYMPH%: 48.5 % (ref 14.0–49.7)
MCH: 29.7 pg (ref 25.1–34.0)
MCHC: 32.5 g/dL (ref 31.5–36.0)
MCV: 91.4 fL (ref 79.5–101.0)
MONO#: 0.4 10*3/uL (ref 0.1–0.9)
MONO%: 7.8 % (ref 0.0–14.0)
NEUT#: 1.8 10*3/uL (ref 1.5–6.5)
NEUT%: 37.7 % — AB (ref 38.4–76.8)
Platelets: 221 10*3/uL (ref 145–400)
RBC: 4.12 10*6/uL (ref 3.70–5.45)
RDW: 12.8 % (ref 11.2–14.5)
WBC: 4.9 10*3/uL (ref 3.9–10.3)

## 2017-07-27 LAB — COMPREHENSIVE METABOLIC PANEL
ANION GAP: 6 meq/L (ref 3–11)
AST: 15 U/L (ref 5–34)
Albumin: 3.6 g/dL (ref 3.5–5.0)
Alkaline Phosphatase: 32 U/L — ABNORMAL LOW (ref 40–150)
BUN: 13.4 mg/dL (ref 7.0–26.0)
CHLORIDE: 99 meq/L (ref 98–109)
CO2: 30 meq/L — AB (ref 22–29)
Calcium: 9.4 mg/dL (ref 8.4–10.4)
Creatinine: 0.8 mg/dL (ref 0.6–1.1)
EGFR: >60 mL/min/{1.73_m2} (ref >60)
Glucose: 81 mg/dl (ref 70–140)
POTASSIUM: 4.7 meq/L (ref 3.5–5.1)
SODIUM: 135 meq/L — AB (ref 136–145)
Total Bilirubin: 0.25 mg/dL (ref 0.20–1.20)
Total Protein: 6.3 g/dL — ABNORMAL LOW (ref 6.4–8.3)

## 2017-07-27 MED ORDER — FULVESTRANT 250 MG/5ML IM SOLN
500.0000 mg | Freq: Once | INTRAMUSCULAR | Status: AC
  Administered 2017-07-27: 500 mg via INTRAMUSCULAR
  Filled 2017-07-27: qty 10

## 2017-07-27 MED ORDER — DENOSUMAB 60 MG/ML ~~LOC~~ SOLN
60.0000 mg | Freq: Once | SUBCUTANEOUS | Status: AC
  Administered 2017-07-27: 60 mg via SUBCUTANEOUS
  Filled 2017-07-27: qty 1

## 2017-07-27 NOTE — Patient Instructions (Signed)
Fulvestrant injection What is this medicine? FULVESTRANT (ful VES trant) blocks the effects of estrogen. It is used to treat breast cancer. This medicine may be used for other purposes; ask your health care provider or pharmacist if you have questions. COMMON BRAND NAME(S): FASLODEX What should I tell my health care provider before I take this medicine? They need to know if you have any of these conditions: -bleeding problems -liver disease -low levels of platelets in the blood -an unusual or allergic reaction to fulvestrant, other medicines, foods, dyes, or preservatives -pregnant or trying to get pregnant -breast-feeding How should I use this medicine? This medicine is for injection into a muscle. It is usually given by a health care professional in a hospital or clinic setting. Talk to your pediatrician regarding the use of this medicine in children. Special care may be needed. Overdosage: If you think you have taken too much of this medicine contact a poison control center or emergency room at once. NOTE: This medicine is only for you. Do not share this medicine with others. What if I miss a dose? It is important not to miss your dose. Call your doctor or health care professional if you are unable to keep an appointment. What may interact with this medicine? -medicines that treat or prevent blood clots like warfarin, enoxaparin, and dalteparin This list may not describe all possible interactions. Give your health care provider a list of all the medicines, herbs, non-prescription drugs, or dietary supplements you use. Also tell them if you smoke, drink alcohol, or use illegal drugs. Some items may interact with your medicine. What should I watch for while using this medicine? Your condition will be monitored carefully while you are receiving this medicine. You will need important blood work done while you are taking this medicine. Do not become pregnant while taking this medicine or for  at least 1 year after stopping it. Women of child-bearing potential will need to have a negative pregnancy test before starting this medicine. Women should inform their doctor if they wish to become pregnant or think they might be pregnant. There is a potential for serious side effects to an unborn child. Men should inform their doctors if they wish to father a child. This medicine may lower sperm counts. Talk to your health care professional or pharmacist for more information. Do not breast-feed an infant while taking this medicine or for 1 year after the last dose. What side effects may I notice from receiving this medicine? Side effects that you should report to your doctor or health care professional as soon as possible: -allergic reactions like skin rash, itching or hives, swelling of the face, lips, or tongue -feeling faint or lightheaded, falls -pain, tingling, numbness, or weakness in the legs -signs and symptoms of infection like fever or chills; cough; flu-like symptoms; sore throat -vaginal bleeding Side effects that usually do not require medical attention (report to your doctor or health care professional if they continue or are bothersome): -aches, pains -constipation -diarrhea -headache -hot flashes -nausea, vomiting -pain at site where injected -stomach pain This list may not describe all possible side effects. Call your doctor for medical advice about side effects. You may report side effects to FDA at 1-800-FDA-1088. Where should I keep my medicine? This drug is given in a hospital or clinic and will not be stored at home. NOTE: This sheet is a summary. It may not cover all possible information. If you have questions about this medicine, talk to your   doctor, pharmacist, or health care provider.  2018 Elsevier/Gold Standard (2015-04-11 11:03:55) Denosumab injection What is this medicine? DENOSUMAB (den oh sue mab) slows bone breakdown. Prolia is used to treat osteoporosis in  women after menopause and in men. Delton See is used to treat a high calcium level due to cancer and to prevent bone fractures and other bone problems caused by multiple myeloma or cancer bone metastases. Delton See is also used to treat giant cell tumor of the bone. This medicine may be used for other purposes; ask your health care provider or pharmacist if you have questions. COMMON BRAND NAME(S): Prolia, XGEVA What should I tell my health care provider before I take this medicine? They need to know if you have any of these conditions: -dental disease -having surgery or tooth extraction -infection -kidney disease -low levels of calcium or Vitamin D in the blood -malnutrition -on hemodialysis -skin conditions or sensitivity -thyroid or parathyroid disease -an unusual reaction to denosumab, other medicines, foods, dyes, or preservatives -pregnant or trying to get pregnant -breast-feeding How should I use this medicine? This medicine is for injection under the skin. It is given by a health care professional in a hospital or clinic setting. If you are getting Prolia, a special MedGuide will be given to you by the pharmacist with each prescription and refill. Be sure to read this information carefully each time. For Prolia, talk to your pediatrician regarding the use of this medicine in children. Special care may be needed. For Delton See, talk to your pediatrician regarding the use of this medicine in children. While this drug may be prescribed for children as young as 13 years for selected conditions, precautions do apply. Overdosage: If you think you have taken too much of this medicine contact a poison control center or emergency room at once. NOTE: This medicine is only for you. Do not share this medicine with others. What if I miss a dose? It is important not to miss your dose. Call your doctor or health care professional if you are unable to keep an appointment. What may interact with this  medicine? Do not take this medicine with any of the following medications: -other medicines containing denosumab This medicine may also interact with the following medications: -medicines that lower your chance of fighting infection -steroid medicines like prednisone or cortisone This list may not describe all possible interactions. Give your health care provider a list of all the medicines, herbs, non-prescription drugs, or dietary supplements you use. Also tell them if you smoke, drink alcohol, or use illegal drugs. Some items may interact with your medicine. What should I watch for while using this medicine? Visit your doctor or health care professional for regular checks on your progress. Your doctor or health care professional may order blood tests and other tests to see how you are doing. Call your doctor or health care professional for advice if you get a fever, chills or sore throat, or other symptoms of a cold or flu. Do not treat yourself. This drug may decrease your body's ability to fight infection. Try to avoid being around people who are sick. You should make sure you get enough calcium and vitamin D while you are taking this medicine, unless your doctor tells you not to. Discuss the foods you eat and the vitamins you take with your health care professional. See your dentist regularly. Brush and floss your teeth as directed. Before you have any dental work done, tell your dentist you are receiving this medicine. Do  not become pregnant while taking this medicine or for 5 months after stopping it. Talk with your doctor or health care professional about your birth control options while taking this medicine. Women should inform their doctor if they wish to become pregnant or think they might be pregnant. There is a potential for serious side effects to an unborn child. Talk to your health care professional or pharmacist for more information. What side effects may I notice from receiving this  medicine? Side effects that you should report to your doctor or health care professional as soon as possible: -allergic reactions like skin rash, itching or hives, swelling of the face, lips, or tongue -bone pain -breathing problems -dizziness -jaw pain, especially after dental work -redness, blistering, peeling of the skin -signs and symptoms of infection like fever or chills; cough; sore throat; pain or trouble passing urine -signs of low calcium like fast heartbeat, muscle cramps or muscle pain; pain, tingling, numbness in the hands or feet; seizures -unusual bleeding or bruising -unusually weak or tired Side effects that usually do not require medical attention (report to your doctor or health care professional if they continue or are bothersome): -constipation -diarrhea -headache -joint pain -loss of appetite -muscle pain -runny nose -tiredness -upset stomach This list may not describe all possible side effects. Call your doctor for medical advice about side effects. You may report side effects to FDA at 1-800-FDA-1088. Where should I keep my medicine? This medicine is only given in a clinic, doctor's office, or other health care setting and will not be stored at home. NOTE: This sheet is a summary. It may not cover all possible information. If you have questions about this medicine, talk to your doctor, pharmacist, or health care provider.  2018 Elsevier/Gold Standard (2016-10-05 19:17:21)  

## 2017-08-01 ENCOUNTER — Telehealth: Payer: Self-pay | Admitting: *Deleted

## 2017-08-01 NOTE — Telephone Encounter (Signed)
1. "Mom needs help around the house.  We've been waiting for a Education officer, museum to call.  She lives alone.  I'm two hours away.  She can't brush her  Hair, bathe, clean house, meals.  The shot makes her weak.        2. She also needs oxygen.  Previously used oxygen at night, stopped a few months ago, now wants oxygen again.  Her feet turn purple at night and when she walks.  Keeps her thermostat on 68  believing she'll live longer keeping thermostat low.  When she walks I notice shortness of breath.  She has no loose skin on her chest to breath easily." No need for medical care in the home.  No medical problems she's tired, just needs help with self care and help around the house." Recent Oxygen saturation on 07-27-2017 = 100 %.   Next scheduled [facility] visit [id number] beginning at 1:00 pm.  Routing call information to [facility] social work,  Production designer, theatre/television/film and provider for review.  Further patient communication through collaborative nurse.   Denies notification of [facility] covered by insurance, want adds, search for any senior resources for [facility].

## 2017-08-02 ENCOUNTER — Encounter: Payer: Self-pay | Admitting: *Deleted

## 2017-08-02 NOTE — Progress Notes (Signed)
Angel [name]  Clinical Social [name] was referred by Arts administrator for home care referral needs. Clinical Social Worker contacted patient's daughter, Angel [name], to offer support and assess for needs.  Patient's daughter reported [name] is having difficulty caring for herself at home, but prefers to be independent and is not willing to move in with daughter.  CSW discussed home care as an additional support in the home that still allows patient to feel "independent".  CSW unable to recommend or provide list of home care agencies due to [place], but did provide information on how to look for agency and questions to ask.  Patient's daughter plans to follow up with CSW as needed.      Maryjean Morn, MSW, LCSW, OSW-C Clinical Social Worker [facility] [phone]

## 2017-08-04 ENCOUNTER — Ambulatory Visit (HOSPITAL_COMMUNITY): Payer: Medicare Other

## 2017-08-06 ENCOUNTER — Other Ambulatory Visit: Payer: Self-pay | Admitting: Oncology

## 2017-08-09 ENCOUNTER — Encounter: Payer: Self-pay | Admitting: Cardiology

## 2017-08-16 ENCOUNTER — Ambulatory Visit (HOSPITAL_COMMUNITY): Payer: Medicare Other

## 2017-08-24 ENCOUNTER — Ambulatory Visit (HOSPITAL_BASED_OUTPATIENT_CLINIC_OR_DEPARTMENT_OTHER): Payer: Medicare Other | Admitting: Oncology

## 2017-08-24 ENCOUNTER — Ambulatory Visit (HOSPITAL_BASED_OUTPATIENT_CLINIC_OR_DEPARTMENT_OTHER): Payer: Medicare Other

## 2017-08-24 ENCOUNTER — Other Ambulatory Visit (HOSPITAL_BASED_OUTPATIENT_CLINIC_OR_DEPARTMENT_OTHER): Payer: Medicare Other

## 2017-08-24 ENCOUNTER — Telehealth: Payer: Self-pay | Admitting: Oncology

## 2017-08-24 VITALS — BP 141/88 | HR 76 | Temp 97.7°F | Resp 16 | Ht 62.0 in | Wt 131.9 lb

## 2017-08-24 DIAGNOSIS — M8580 Other specified disorders of bone density and structure, unspecified site: Secondary | ICD-10-CM | POA: Diagnosis not present

## 2017-08-24 DIAGNOSIS — C50911 Malignant neoplasm of unspecified site of right female breast: Secondary | ICD-10-CM

## 2017-08-24 DIAGNOSIS — Z5111 Encounter for antineoplastic chemotherapy: Secondary | ICD-10-CM | POA: Diagnosis not present

## 2017-08-24 DIAGNOSIS — D0512 Intraductal carcinoma in situ of left breast: Secondary | ICD-10-CM

## 2017-08-24 DIAGNOSIS — Z170 Estrogen receptor positive status [ER+]: Secondary | ICD-10-CM

## 2017-08-24 DIAGNOSIS — C50811 Malignant neoplasm of overlapping sites of right female breast: Secondary | ICD-10-CM

## 2017-08-24 DIAGNOSIS — M818 Other osteoporosis without current pathological fracture: Secondary | ICD-10-CM

## 2017-08-24 LAB — CBC WITH DIFFERENTIAL/PLATELET
BASO%: 0.7 % (ref 0.0–2.0)
Basophils Absolute: 0 10*3/uL (ref 0.0–0.1)
EOS ABS: 0.1 10*3/uL (ref 0.0–0.5)
EOS%: 2 % (ref 0.0–7.0)
HEMATOCRIT: 38.8 % (ref 34.8–46.6)
HEMOGLOBIN: 12.8 g/dL (ref 11.6–15.9)
LYMPH#: 2 10*3/uL (ref 0.9–3.3)
LYMPH%: 36.4 % (ref 14.0–49.7)
MCH: 29.7 pg (ref 25.1–34.0)
MCHC: 33 g/dL (ref 31.5–36.0)
MCV: 89.9 fL (ref 79.5–101.0)
MONO#: 0.4 10*3/uL (ref 0.1–0.9)
MONO%: 7.3 % (ref 0.0–14.0)
NEUT#: 3 10*3/uL (ref 1.5–6.5)
NEUT%: 53.6 % (ref 38.4–76.8)
PLATELETS: 206 10*3/uL (ref 145–400)
RBC: 4.31 10*6/uL (ref 3.70–5.45)
RDW: 12.8 % (ref 11.2–14.5)
WBC: 5.6 10*3/uL (ref 3.9–10.3)

## 2017-08-24 LAB — COMPREHENSIVE METABOLIC PANEL
ALT: 9 U/L (ref 0–55)
ANION GAP: 9 meq/L (ref 3–11)
AST: 18 U/L (ref 5–34)
Albumin: 4 g/dL (ref 3.5–5.0)
Alkaline Phosphatase: 39 U/L — ABNORMAL LOW (ref 40–150)
BILIRUBIN TOTAL: 0.26 mg/dL (ref 0.20–1.20)
BUN: 12.3 mg/dL (ref 7.0–26.0)
CHLORIDE: 99 meq/L (ref 98–109)
CO2: 27 meq/L (ref 22–29)
Calcium: 9.6 mg/dL (ref 8.4–10.4)
Creatinine: 0.8 mg/dL (ref 0.6–1.1)
GLUCOSE: 76 mg/dL (ref 70–140)
Potassium: 4.5 mEq/L (ref 3.5–5.1)
SODIUM: 135 meq/L — AB (ref 136–145)
TOTAL PROTEIN: 6.7 g/dL (ref 6.4–8.3)

## 2017-08-24 MED ORDER — FULVESTRANT 250 MG/5ML IM SOLN
500.0000 mg | Freq: Once | INTRAMUSCULAR | Status: AC
  Administered 2017-08-24: 500 mg via INTRAMUSCULAR
  Filled 2017-08-24: qty 10

## 2017-08-24 NOTE — Telephone Encounter (Signed)
Gave patient AVS and calendar of upcoming December through April 2019 appointments.

## 2017-08-24 NOTE — Patient Instructions (Signed)

## 2017-08-24 NOTE — Progress Notes (Signed)
[place]  [phone]) [phone] Fax:([phone]) [phone]   ID: KATRIA BOTTS DOB: [birth year]  MR#: [id number]  IRC#:[id number]  Patient Care Team: Carollee Herter, Alferd Apa, DO as PCP - General (Family Medicine) Aycen Porreca, Virgie Dad, MD as Consulting Physician (Oncology) [name], MD as Consulting Physician (Cardiology) OTHER M.D.: Maisie Fus Kurup (ophth)  CHIEF COMPLAINT: Chest wall recurrence of breast cancer  CURRENT TREATMENT: Fulvestrant, Denosumab/Prolia  BREAST CANCER HISTORY: Per Dr. 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 She had a second right breast cancer in 2012, with right mastectomy by Dr [name] 07-29-2011 for T1N0M0 ER/PR positive, HER-2 negative invasive ductal carcinoma; she also had what was planned as prophylactic left mastectomy, with unexpected finding of left DCIS in that pathology. Plan in 09-2011 was to begin tamoxifen, chosen particularly due to low bone density. Patient did not keep follow up visits at this office, stopped tamoxifen due intolerance (tho she does not remember what problem she had with this) and was treated with raloxifene by Dr [name].  She was seen next at this office on 12-26-2013 with an enlarging nodular area above right mastectomy scar,reportedly present for several months. Excisional biopsy done 01-21-14 found grade 2 invasive ductal carcinoma with involvement at [facility]) and lymphovascular space invasion, ER positive 100%, PR positive 75%, proliferation marker 80% and HER-2 negative by [facility] (pathology [phone]). PET 02-15-14 did not identify any involvement beyond right chest wall. She had 4500 cGy as electron beam radiation from 5-27 thru 03-27-14 to right chest wall. By 04-30-14 there were multiple tiny nodules in region of right mastectomy scar."  The patient's subsequent history is as detailed  below  INTERVAL HISTORY: Jilian returns today for follow-up of her estrogen receptor positive breast cancer accompanied by her daughter. She is receiving fulvestrant every 28 days, which she tolerates well. She is unaware of any side effects.    REVIEW OF SYSTEMS: Marland Mcalpine that for Thanksgiving she went to her granddaughter's home and spent time with family. She notes some occasional bouts of nausea and fatigue that she notes are not related to the fulvestrant shots. She notices this more when she stays a long time at the store. She does not have reflux often. Other than that, she is well overall. She reports that she is able to complete her own housework.  She denies unusual headaches, visual changes, nausea, vomiting, or dizziness. There has been no unusual cough, phlegm production, or pleurisy. This been no change in bowel or bladder habits. She denies unexplained fatigue or unexplained weight loss, bleeding, rash, or fever. A detailed review of systems was otherwise stable.   PAST MEDICAL HISTORY: Past Medical History:  Diagnosis Date  . Anxiety   . Asthma   . Breast cancer ([place]) 05/07/1999, 06/2011   a. s/p bilat mastectomies. recurrence 01/2014. Radiation, meds.  . Cancer of chest (wall) ([place])   . Cataract    eye surgery planned for 11/2014  . Chest wall recurrence of breast cancer ([place]) 2016  . Cholelithiasis   . Chronic bronchitis   . COPD (chronic obstructive pulmonary disease) ([place])   . Coronary artery disease Non-obstructive   a. 2012 Cath: LAD 40-2m>Med Rx;  b. 11/2013 Echo: EF 50-55%, nl wall motion;  c. 12/2013 Lexi MV: EF 74%, no ischemia.  . DDD (degenerative disc disease)   . Degenerative joint disease   . Depression   . Dyslipidemia   .  GERD (gastroesophageal reflux disease)   . Hx of radiation therapy 07/02/1999- 08/11/1999   right supraclavucular/axillary region: 5040 cGy, 28 fractions  . Hx of radiation therapy 02/20/14- 03/27/14   right chest wall 4500 cGy 25 sessions   . Hypertension   . Kidney stones    one stones  . Osteoarthritis   . Osteoporosis   . Pre-diabetes   . PVC's (premature ventricular contractions)   . Reflux esophagitis   . Skin cancer of face    non melanoma  . Spinal stenosis     PAST SURGICAL HISTORY: Past Surgical History:  Procedure Laterality Date  . ABDOMINAL HYSTERECTOMY     in her 30's  . APPENDECTOMY    . BREAST BIOPSY Right 01/21/2014   Procedure: BREAST/CHEST WALL BIOPSY;  Surgeon: [name], MD;  Location: [place];  Service: General;  Laterality: Right;  . BREAST LUMPECTOMY  2002   right breast  . CARDIAC CATHETERIZATION  03/2011, 3/15  . LEFT HEART CATHETERIZATION WITH CORONARY ANGIOGRAM N/A 12/05/2013   Procedure: LEFT HEART CATHETERIZATION WITH CORONARY ANGIOGRAM;  Surgeon: [name], MD;  Location: [facility] CATH LAB;  Service: Cardiovascular;  Laterality: N/A;  . LEFT HEART CATHETERIZATION WITH CORONARY ANGIOGRAM N/A 11/06/2014   Procedure: LEFT HEART CATHETERIZATION WITH CORONARY ANGIOGRAM;  Surgeon: [name], MD;  Location: [facility] CATH LAB;  Service: Cardiovascular;  Laterality: N/A;  . MASTECTOMY  07/29/11   bilateral-rt nodes-none on left  . SMALL INTESTINE SURGERY  1998   SBO  . TONSILLECTOMY    . TUBAL LIGATION  1961  . VEIN LIGATION AND STRIPPING Right   . VESICOVAGINAL FISTULA CLOSURE W/ TAH  age 50    FAMILY HISTORY Family History  Problem Relation Age of Onset  . Pancreatic cancer Mother   . Cancer Mother        pancreatic  . Asthma Daughter   . Stroke Daughter   . Hypertension Daughter   . Heart disease Father   . Emphysema Father   . Lymphoma Brother   . Asthma Brother   . Asthma Grandchild   . Asthma Brother        deceased  . Heart disease Brother    the patient's father died at the age of 64 from heart disease. He was a smoker. The patient's mother died at the age of 6 with cancer of the pancreas. The patient has 3 brothers, no sisters. There is no history of breast  or ovarian cancer in the family to her knowledge.  GYNECOLOGIC HISTORY:  No LMP recorded. Patient has had a hysterectomy. Menarche age 84, first live birth age 49. She is GX P4. She stopped having periods in her 43s. She took hormone replacement for approximately 10 years. 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 She has been "single" for 40 years. Her oldest daughter died from a subarachnoid hemorrhage at age 49, 2 years ago. This is when the patient had undergone treatment for invasive breast cancer and she tells me she was simply overwhelmed and could not start the planned antiestrogen therapy. The next daughter, [name], is a retired Software engineer. She lives in [place]. Next child, Freda Munro, works in Charity fundraiser. She also lives in [place]. The youngest, [name], is retired from the Office manager position. He also owned his own business. The patient has 7 grandchildren and 2 many great-grandchildren to count. She attends the local  [employer]    ADVANCED DIRECTIVES: In place; the patient's daughter Freda Munro is her healthcare power of attorney. She can be reached at [place]: Social History   Tobacco Use  . Smoking status: Never Smoker  . Smokeless tobacco: Never Used  Substance Use Topics  . Alcohol use: Yes    Comment: occ wine  . Drug use: No     Colonoscopy:  PAP:  Bone density: August 2015   Lipid panel:  Allergies  Allergen Reactions  . Adhesive [Tape] Other (See Comments)    Pt prefers to use paper tape  . Penicillins Rash    Has patient had a PCN reaction causing immediate rash, facial/tongue/throat swelling, SOB or lightheadedness with hypotension: Yes Has patient had a PCN reaction causing severe rash involving mucus membranes or skin necrosis: No Has patient had a PCN reaction that required hospitalization No Has patient had  a PCN reaction occurring within the last 10 years: No If all of the above answers are "NO", then may proceed with Cephalosporin use.     Current Outpatient Medications  Medication Sig Dispense Refill  . albuterol (PROVENTIL HFA;VENTOLIN HFA) 108 (90 Base) MCG/ACT inhaler Inhale 1-2 puffs into the lungs every 4 (four) hours as needed for wheezing or shortness of breath. 1 Inhaler 0  . Artificial Tear Ointment (DRY EYES OP) Place 2 drops into both eyes daily as needed (dry eyes).     [name] aspirin EC 81 MG tablet Take 2 tablets (162 mg total) by mouth daily.    [name] atorvastatin (LIPITOR) 20 MG tablet TAKE ONE TABLET BY MOUTH ONCE DAILY 30 tablet 0  . B Complex-Biotin-FA (B-COMPLEX PO) Take 1 tablet by mouth daily.     . Biotin 5000 MCG CAPS Take 1 capsule (5,000 mcg total) by mouth daily. 90 capsule 3  . budesonide-formoterol (SYMBICORT) 160-4.5 MCG/ACT inhaler Inhale 2 puffs into the lungs 2 (two) times daily as needed. (Patient taking differently: Inhale 2 puffs into the lungs 2 (two) times daily as needed (wheezing, shortness of breath). ) 3 Inhaler 1  . Calcium-Magnesium-Zinc 1000-400-15 MG TABS Take 1 tablet by mouth daily.      . celecoxib (CELEBREX) 200 MG capsule TAKE 1 CAPSULE DAILY WITH  FOOD FOR PAIN AND  INFLAMMATION 90 capsule 0  . Cholecalciferol (VITAMIN D-3) 5000 UNITS TABS Take 1 tablet by mouth daily.     . citalopram (CELEXA) 40 MG tablet TAKE 1 TABLET BY MOUTH  DAILY 90 tablet 0  . diazepam (VALIUM) 5 MG tablet TAKE 1 TABLET BY MOUTH EVERY 6 HOURS AS NEEDED FOR ANXIETY 45 tablet 1  . DULoxetine (CYMBALTA) 60 MG capsule TAKE 1 CAPSULE BY MOUTH  EVERY DAY FOR MOOD 90 capsule 0  . ezetimibe (ZETIA) 10 MG tablet Take 1 tablet (10 mg total) by mouth daily.    . Fish Oil OIL Take 1 capsule by mouth daily.     . Flaxseed, Linseed, (GROUND FLAX SEEDS PO) Take 5 mLs by mouth daily.     [name] gabapentin (NEURONTIN) 100 MG capsule 1-2 po nightly 60 capsule 3  . isosorbide mononitrate (IMDUR) 30  MG 24 hr tablet TAKE 1 TABLET BY MOUTH  DAILY 90 tablet 0  . montelukast (SINGULAIR) 10 MG tablet TAKE 1 TABLET BY MOUTH AT  BEDTIME 90 tablet 0  . Multiple Vitamin (MULTIVITAMIN) tablet Take 1 tablet by mouth daily.    . ondansetron (ZOFRAN ODT) 4 MG disintegrating tablet Take 1 tablet (  4 mg total) by mouth every 8 (eight) hours as needed for nausea or vomiting. 20 tablet 0  . oxyCODONE-acetaminophen (PERCOCET/ROXICET) 5-325 MG tablet Take 1 tablet by mouth daily as needed for pain.     No current facility-administered medications for this visit.    Facility-Administered Medications Ordered in Other Visits  Medication Dose Route Frequency Provider Last Rate Last Dose  . fulvestrant (FASLODEX) injection 500 mg  500 mg Intramuscular Once Kendy Haston, Virgie Dad, MD        OBJECTIVE: Elderly white woman in no acute distress  Vitals:   08/24/17 1343  BP: (!) 141/88  Pulse: 76  Resp: 16  Temp: 97.7 F (36.5 C)  SpO2: 100%     Body mass index is 24.12 kg/m.    ECOG FS:1 - Symptomatic but completely ambulatory  Sclerae unicteric, pupils round and equal Oropharynx clear and moist No cervical or supraclavicular adenopathy Lungs no rales or rhonchi Heart regular rate and rhythm Abd soft, nontender, positive bowel sounds MSK no focal spinal tenderness, no upper extremity lymphedema Neuro: nonfocal, well oriented, appropriate affect Breasts: Status post bilateral mastectomies.  There is no evidence of chest wall recurrence.  Both axillae are benign.   LAB RESULTS:  CMP     Component Value Date/Time   NA 135 (L) 08/24/2017 1323   K 4.5 08/24/2017 1323   CL 102 09/29/2016 1222   CO2 27 08/24/2017 1323   GLUCOSE 76 08/24/2017 1323   BUN 12.3 08/24/2017 1323   CREATININE 0.8 08/24/2017 1323   CALCIUM 9.6 08/24/2017 1323   PROT 6.7 08/24/2017 1323   ALBUMIN 4.0 08/24/2017 1323   AST 18 08/24/2017 1323   ALT 9 08/24/2017 1323   ALKPHOS 39 (L) 08/24/2017 1323   BILITOT 0.26 08/24/2017  1323   GFRNONAA >60 09/29/2016 1222   GFRNONAA 73 05/01/2014 1050   GFRAA >60 09/29/2016 1222   GFRAA 84 05/01/2014 1050    I No results found for: SPEP  Lab Results  Component Value Date   WBC 5.6 08/24/2017   NEUTROABS 3.0 08/24/2017   HGB 12.8 08/24/2017   HCT 38.8 08/24/2017   MCV 89.9 08/24/2017   PLT 206 08/24/2017      Chemistry      Component Value Date/Time   NA 135 (L) 08/24/2017 1323   K 4.5 08/24/2017 1323   CL 102 09/29/2016 1222   CO2 27 08/24/2017 1323   BUN 12.3 08/24/2017 1323   CREATININE 0.8 08/24/2017 1323      Component Value Date/Time   CALCIUM 9.6 08/24/2017 1323   ALKPHOS 39 (L) 08/24/2017 1323   AST 18 08/24/2017 1323   ALT 9 08/24/2017 1323   BILITOT 0.26 08/24/2017 1323       Lab Results  Component Value Date   LABCA2 12 11/26/2015    No components found for: [id number]  No results for input(s): INR in the last 168 hours.  Urinalysis    Component Value Date/Time   COLORURINE YELLOW 09/29/2016 1315   APPEARANCEUR CLEAR 09/29/2016 1315   LABSPEC 1.011 09/29/2016 1315   PHURINE 6.0 09/29/2016 1315   GLUCOSEU NEGATIVE 09/29/2016 1315   HGBUR NEGATIVE 09/29/2016 1315   BILIRUBINUR NEGATIVE 09/29/2016 1315   BILIRUBINUR neg 04/25/2015 1015   KETONESUR NEGATIVE 09/29/2016 1315   PROTEINUR NEGATIVE 09/29/2016 1315   UROBILINOGEN 0.2 04/25/2015 1015   UROBILINOGEN 0.2 02/14/2015 2010   NITRITE NEGATIVE 09/29/2016 1315   LEUKOCYTESUR NEGATIVE 09/29/2016 1315    STUDIES: Bone  scan 10/27/2016 shows a left iliac wing area of increased uptake and a photopenic defect in the right femoral diaphysis.  CT of the chest same month was negative  ASSESSMENT: 83 y.o. [place] woman with locally recurrent breast cancer, as follows:  (1) status post right lumpectomy and sentinel node sampling September 2000 for ductal carcinoma in situ (Ni+), estrogen and progesterone receptor positive, with a low proliferation index,  (a) received 5040  cGy to the right breast and regional lymph nodes  (b) on tamoxifen less than 3 months  (2) s/p bilateral mastectomies and right axillary lymph node sampling 07/29/2011, showing  (a) in the left breast, ductal carcinoma in situ measuring 1.2 cm, grade 1, with negative margins, estrogen and progesterone receptor positive  (b)  on the right, a pT1c pN0, stage IA invasive ductal carcinoma, estrogen receptor and 93% positive, progesterone receptor 87% positive, with an MIB-1 of 35%, and no HER-2 amplification margins were close but negative  (c) did not receive adjuvant antiestrogen therapy  (3) right chest wall skin recurrence resected 01/21/2014, measuring 1.7 cm, with positive margins, estrogen receptor 100% positive, progesterone receptor 75% positive, with an MIB-1 of 80%, and no HER-2 amplification; PET scan 02/15/2014 negative except for Right chest wall focus  (a) received 45 Gy electron-beam therapy to the right chest wall completed 03/27/2014  (b) skin progression within and without the radiation field noted 04/30/2014  (c) letrozole started 05/13/2014, discontinued March 2016 with progression   (4) dexa scan 05/09/2014 at [facility] shows osteoporosis with a T score of -2.9  (5) Fulvestrant started 12/25/2014  (6) osteopenia, on DEXA scan 05/09/2014  (a) started Denosumab/prolia 08/06/2015, repeated every 24 weeks  (7) Chronic pain: Followed by Dr. Nelva Bush  PLAN: Dickie is doing remarkably well, now almost 3 years into her fulvestrant treatment.  The plan is to continue to a total of 5.  She is receiving denosumab/Prolia every 6 months, with her next dose due in April.  I will see her again with her April dose.  She knows to call for any problems that may develop before then.  Phuc Kluttz, Virgie Dad, MD  08/24/17 2:14 PM Medical Oncology and Hematology [facility] [address] [place], [place] [address] Tel. [phone]    Fax. [phone]  This  document serves as a record of services personally performed by Lurline Del, MD. It was created on his behalf by Sheron Nightingale, a trained medical scribe. The creation of this record is based on the scribe's personal observations and the provider's statements to them.   I have reviewed the above documentation for accuracy and completeness, and I agree with the above.

## 2017-09-07 ENCOUNTER — Other Ambulatory Visit: Payer: Self-pay | Admitting: *Deleted

## 2017-09-07 MED ORDER — ONDANSETRON 4 MG PO TBDP: 4.0000 mg | ORAL_TABLET | Freq: Three times a day (TID) | ORAL | 1 refills | Status: DC | PRN

## 2017-09-14 ENCOUNTER — Other Ambulatory Visit: Payer: Medicare Other

## 2017-09-14 ENCOUNTER — Ambulatory Visit: Payer: Medicare Other

## 2017-09-14 MED ORDER — FULVESTRANT 250 MG/5ML IM SOLN
INTRAMUSCULAR | Status: AC
  Filled 2017-09-14: qty 5

## 2017-09-21 ENCOUNTER — Other Ambulatory Visit: Payer: Medicare Other

## 2017-09-21 ENCOUNTER — Ambulatory Visit: Payer: Medicare Other

## 2017-09-21 DIAGNOSIS — C50919 Malignant neoplasm of unspecified site of unspecified female breast: Secondary | ICD-10-CM | POA: Insufficient documentation

## 2017-09-21 DIAGNOSIS — J449 Chronic obstructive pulmonary disease, unspecified: Secondary | ICD-10-CM | POA: Insufficient documentation

## 2017-09-21 DIAGNOSIS — F329 Major depressive disorder, single episode, unspecified: Secondary | ICD-10-CM | POA: Insufficient documentation

## 2017-09-21 DIAGNOSIS — I2510 Atherosclerotic heart disease of native coronary artery without angina pectoris: Secondary | ICD-10-CM | POA: Insufficient documentation

## 2017-09-21 DIAGNOSIS — K2100 Gastro-esophageal reflux disease with esophagitis, without bleeding: Secondary | ICD-10-CM | POA: Insufficient documentation

## 2017-09-21 DIAGNOSIS — Z923 Personal history of irradiation: Secondary | ICD-10-CM | POA: Insufficient documentation

## 2017-09-21 DIAGNOSIS — F419 Anxiety disorder, unspecified: Secondary | ICD-10-CM | POA: Insufficient documentation

## 2017-09-21 DIAGNOSIS — K8020 Calculus of gallbladder without cholecystitis without obstruction: Secondary | ICD-10-CM | POA: Insufficient documentation

## 2017-09-21 DIAGNOSIS — K219 Gastro-esophageal reflux disease without esophagitis: Secondary | ICD-10-CM | POA: Insufficient documentation

## 2017-09-21 DIAGNOSIS — C761 Malignant neoplasm of thorax: Secondary | ICD-10-CM | POA: Insufficient documentation

## 2017-09-21 DIAGNOSIS — M1990 Unspecified osteoarthritis, unspecified site: Secondary | ICD-10-CM | POA: Insufficient documentation

## 2017-09-21 DIAGNOSIS — F32A Depression, unspecified: Secondary | ICD-10-CM | POA: Insufficient documentation

## 2017-09-21 DIAGNOSIS — M4800 Spinal stenosis, site unspecified: Secondary | ICD-10-CM | POA: Insufficient documentation

## 2017-09-21 DIAGNOSIS — I493 Ventricular premature depolarization: Secondary | ICD-10-CM | POA: Insufficient documentation

## 2017-09-21 DIAGNOSIS — C44300 Unspecified malignant neoplasm of skin of unspecified part of face: Secondary | ICD-10-CM | POA: Insufficient documentation

## 2017-09-21 DIAGNOSIS — H269 Unspecified cataract: Secondary | ICD-10-CM | POA: Insufficient documentation

## 2017-09-21 DIAGNOSIS — N200 Calculus of kidney: Secondary | ICD-10-CM | POA: Insufficient documentation

## 2017-09-21 DIAGNOSIS — R7303 Prediabetes: Secondary | ICD-10-CM | POA: Insufficient documentation

## 2017-09-22 ENCOUNTER — Ambulatory Visit: Payer: Medicare Other

## 2017-09-22 ENCOUNTER — Other Ambulatory Visit (HOSPITAL_BASED_OUTPATIENT_CLINIC_OR_DEPARTMENT_OTHER): Payer: Medicare Other

## 2017-09-22 ENCOUNTER — Ambulatory Visit (HOSPITAL_BASED_OUTPATIENT_CLINIC_OR_DEPARTMENT_OTHER): Payer: Medicare Other

## 2017-09-22 ENCOUNTER — Other Ambulatory Visit: Payer: Self-pay | Admitting: *Deleted

## 2017-09-22 DIAGNOSIS — C50911 Malignant neoplasm of unspecified site of right female breast: Secondary | ICD-10-CM | POA: Diagnosis not present

## 2017-09-22 DIAGNOSIS — M818 Other osteoporosis without current pathological fracture: Secondary | ICD-10-CM

## 2017-09-22 DIAGNOSIS — Z5111 Encounter for antineoplastic chemotherapy: Secondary | ICD-10-CM

## 2017-09-22 DIAGNOSIS — C50011 Malignant neoplasm of nipple and areola, right female breast: Secondary | ICD-10-CM

## 2017-09-22 LAB — COMPREHENSIVE METABOLIC PANEL
ALBUMIN: 4 g/dL (ref 3.5–5.0)
ALT: 11 U/L (ref 0–55)
AST: 19 U/L (ref 5–34)
Alkaline Phosphatase: 34 U/L — ABNORMAL LOW (ref 40–150)
Anion Gap: 10 mEq/L (ref 3–11)
BUN: 8.5 mg/dL (ref 7.0–26.0)
CO2: 27 mEq/L (ref 22–29)
Calcium: 9.5 mg/dL (ref 8.4–10.4)
Chloride: 102 mEq/L (ref 98–109)
Creatinine: 0.8 mg/dL (ref 0.6–1.1)
EGFR: >60 mL/min/{1.73_m2} (ref >60)
GLUCOSE: 108 mg/dL (ref 70–140)
Potassium: 3.4 mEq/L — ABNORMAL LOW (ref 3.5–5.1)
SODIUM: 139 meq/L (ref 136–145)
TOTAL PROTEIN: 6.8 g/dL (ref 6.4–8.3)
Total Bilirubin: 0.32 mg/dL (ref 0.20–1.20)

## 2017-09-22 LAB — CBC WITH DIFFERENTIAL/PLATELET
BASO%: 0.7 % (ref 0.0–2.0)
Basophils Absolute: 0 10*3/uL (ref 0.0–0.1)
EOS ABS: 0 10*3/uL (ref 0.0–0.5)
EOS%: 0.5 % (ref 0.0–7.0)
HEMATOCRIT: 41.6 % (ref 34.8–46.6)
HEMOGLOBIN: 13.6 g/dL (ref 11.6–15.9)
LYMPH#: 1.7 10*3/uL (ref 0.9–3.3)
LYMPH%: 32.6 % (ref 14.0–49.7)
MCH: 29.7 pg (ref 25.1–34.0)
MCHC: 32.7 g/dL (ref 31.5–36.0)
MCV: 90.6 fL (ref 79.5–101.0)
MONO#: 0.3 10*3/uL (ref 0.1–0.9)
MONO%: 6.3 % (ref 0.0–14.0)
NEUT%: 59.9 % (ref 38.4–76.8)
NEUTROS ABS: 3.1 10*3/uL (ref 1.5–6.5)
PLATELETS: 220 10*3/uL (ref 145–400)
RBC: 4.6 10*6/uL (ref 3.70–5.45)
RDW: 13.2 % (ref 11.2–14.5)
WBC: 5.1 10*3/uL (ref 3.9–10.3)

## 2017-09-22 MED ORDER — FULVESTRANT 250 MG/5ML IM SOLN
500.0000 mg | Freq: Once | INTRAMUSCULAR | Status: AC
  Administered 2017-09-22: 500 mg via INTRAMUSCULAR

## 2017-09-22 NOTE — Patient Instructions (Signed)

## 2017-09-26 ENCOUNTER — Other Ambulatory Visit: Payer: Self-pay | Admitting: Oncology

## 2017-09-28 ENCOUNTER — Other Ambulatory Visit: Payer: Self-pay | Admitting: *Deleted

## 2017-09-28 ENCOUNTER — Other Ambulatory Visit: Payer: Self-pay

## 2017-09-28 DIAGNOSIS — F329 Major depressive disorder, single episode, unspecified: Secondary | ICD-10-CM

## 2017-09-28 DIAGNOSIS — G894 Chronic pain syndrome: Secondary | ICD-10-CM

## 2017-09-28 DIAGNOSIS — I2510 Atherosclerotic heart disease of native coronary artery without angina pectoris: Secondary | ICD-10-CM

## 2017-09-28 DIAGNOSIS — J4520 Mild intermittent asthma, uncomplicated: Secondary | ICD-10-CM

## 2017-09-28 DIAGNOSIS — F32A Depression, unspecified: Secondary | ICD-10-CM

## 2017-09-28 MED ORDER — ISOSORBIDE MONONITRATE ER 30 MG PO TB24: 30.0000 mg | ORAL_TABLET | Freq: Every day | ORAL | 0 refills | Status: DC

## 2017-09-28 MED ORDER — DIAZEPAM 5 MG PO TABS: 5.0000 mg | ORAL_TABLET | Freq: Four times a day (QID) | ORAL | 1 refills | Status: DC | PRN

## 2017-09-28 MED ORDER — CITALOPRAM HYDROBROMIDE 40 MG PO TABS: 40.0000 mg | ORAL_TABLET | Freq: Every day | ORAL | 0 refills | Status: DC

## 2017-09-28 MED ORDER — DULOXETINE HCL 60 MG PO CPEP: ORAL_CAPSULE | ORAL | 0 refills | Status: DC

## 2017-09-28 MED ORDER — CELECOXIB 200 MG PO CAPS: ORAL_CAPSULE | ORAL | 0 refills | Status: DC

## 2017-09-28 MED ORDER — MONTELUKAST SODIUM 10 MG PO TABS: 10.0000 mg | ORAL_TABLET | Freq: Every day | ORAL | 0 refills | Status: DC

## 2017-09-29 ENCOUNTER — Other Ambulatory Visit: Payer: Self-pay | Admitting: Oncology

## 2017-10-07 ENCOUNTER — Ambulatory Visit: Payer: Medicare Other | Admitting: Cardiology

## 2017-10-07 ENCOUNTER — Telehealth: Payer: Self-pay | Admitting: Oncology

## 2017-10-07 NOTE — Telephone Encounter (Signed)
FAXED OFFICE NOTE TO [facility] [phone]

## 2017-10-19 ENCOUNTER — Other Ambulatory Visit: Payer: Self-pay | Admitting: *Deleted

## 2017-10-19 ENCOUNTER — Inpatient Hospital Stay: Payer: Medicare Other | Attending: Oncology

## 2017-10-19 ENCOUNTER — Inpatient Hospital Stay: Payer: Medicare Other

## 2017-10-19 DIAGNOSIS — C50911 Malignant neoplasm of unspecified site of right female breast: Secondary | ICD-10-CM | POA: Diagnosis not present

## 2017-10-19 DIAGNOSIS — M818 Other osteoporosis without current pathological fracture: Secondary | ICD-10-CM

## 2017-10-19 DIAGNOSIS — Z5111 Encounter for antineoplastic chemotherapy: Secondary | ICD-10-CM | POA: Diagnosis not present

## 2017-10-19 DIAGNOSIS — C792 Secondary malignant neoplasm of skin: Secondary | ICD-10-CM | POA: Insufficient documentation

## 2017-10-19 LAB — CBC WITH DIFFERENTIAL/PLATELET
BASOS PCT: 0 %
Basophils Absolute: 0 10*3/uL (ref 0.0–0.1)
Eosinophils Absolute: 0.1 10*3/uL (ref 0.0–0.5)
Eosinophils Relative: 1 %
HCT: 41.3 % (ref 34.8–46.6)
Hemoglobin: 13.3 g/dL (ref 11.6–15.9)
Lymphocytes Relative: 40 %
Lymphs Abs: 2.1 10*3/uL (ref 0.9–3.3)
MCH: 30.1 pg (ref 25.1–34.0)
MCHC: 32.2 g/dL (ref 31.5–36.0)
MCV: 93.4 fL (ref 79.5–101.0)
MONO ABS: 0.4 10*3/uL (ref 0.1–0.9)
MONOS PCT: 7 %
Neutro Abs: 2.6 10*3/uL (ref 1.5–6.5)
Neutrophils Relative %: 52 %
PLATELETS: 216 10*3/uL (ref 145–400)
RBC: 4.42 MIL/uL (ref 3.70–5.45)
RDW: 13.4 % (ref 11.2–16.1)
WBC: 5.1 10*3/uL (ref 3.9–10.3)

## 2017-10-19 LAB — COMPREHENSIVE METABOLIC PANEL
ALK PHOS: 32 U/L — AB (ref 40–150)
ALT: 10 U/L (ref 0–55)
AST: 16 U/L (ref 5–34)
Albumin: 3.8 g/dL (ref 3.5–5.0)
Anion gap: 8 (ref 3–11)
BILIRUBIN TOTAL: 0.3 mg/dL (ref 0.2–1.2)
BUN: 11 mg/dL (ref 7–26)
CALCIUM: 9.5 mg/dL (ref 8.4–10.4)
CO2: 29 mmol/L (ref 22–29)
Chloride: 102 mmol/L (ref 98–109)
Creatinine, Ser: 0.8 mg/dL (ref 0.60–1.10)
GFR calc non Af Amer: >60 mL/min (ref >60)
Glucose, Bld: 84 mg/dL (ref 70–140)
Potassium: 4.6 mmol/L (ref 3.3–4.7)
SODIUM: 139 mmol/L (ref 136–145)
TOTAL PROTEIN: 6.7 g/dL (ref 6.4–8.3)

## 2017-10-19 MED ORDER — FULVESTRANT 250 MG/5ML IM SOLN
500.0000 mg | Freq: Once | INTRAMUSCULAR | Status: AC
  Administered 2017-10-19: 500 mg via INTRAMUSCULAR

## 2017-10-19 MED ORDER — FULVESTRANT 250 MG/5ML IM SOLN: 500.0000 mg | Freq: Once | INTRAMUSCULAR | Status: DC

## 2017-10-19 MED ORDER — FULVESTRANT 250 MG/5ML IM SOLN
INTRAMUSCULAR | Status: AC
  Filled 2017-10-19: qty 5

## 2017-10-19 NOTE — Patient Instructions (Signed)

## 2017-10-26 ENCOUNTER — Other Ambulatory Visit: Payer: Self-pay

## 2017-10-26 ENCOUNTER — Encounter (HOSPITAL_COMMUNITY): Payer: Self-pay | Admitting: Emergency Medicine

## 2017-10-26 ENCOUNTER — Emergency Department (HOSPITAL_COMMUNITY)
Admission: EM | Admit: 2017-10-26 | Discharge: 2017-10-26 | Disposition: A | Discharge status: Home / Self Care | Payer: Medicare Other | Attending: Emergency Medicine | Admitting: Emergency Medicine

## 2017-10-26 ENCOUNTER — Emergency Department (HOSPITAL_COMMUNITY): Payer: Medicare Other

## 2017-10-26 DIAGNOSIS — Z7982 Long term (current) use of aspirin: Secondary | ICD-10-CM | POA: Diagnosis not present

## 2017-10-26 DIAGNOSIS — I2510 Atherosclerotic heart disease of native coronary artery without angina pectoris: Secondary | ICD-10-CM | POA: Insufficient documentation

## 2017-10-26 DIAGNOSIS — Z79899 Other long term (current) drug therapy: Secondary | ICD-10-CM | POA: Diagnosis not present

## 2017-10-26 DIAGNOSIS — E785 Hyperlipidemia, unspecified: Secondary | ICD-10-CM | POA: Diagnosis not present

## 2017-10-26 DIAGNOSIS — J449 Chronic obstructive pulmonary disease, unspecified: Secondary | ICD-10-CM | POA: Insufficient documentation

## 2017-10-26 DIAGNOSIS — R0789 Other chest pain: Secondary | ICD-10-CM

## 2017-10-26 DIAGNOSIS — R110 Nausea: Secondary | ICD-10-CM | POA: Diagnosis not present

## 2017-10-26 DIAGNOSIS — I10 Essential (primary) hypertension: Secondary | ICD-10-CM | POA: Insufficient documentation

## 2017-10-26 DIAGNOSIS — Z853 Personal history of malignant neoplasm of breast: Secondary | ICD-10-CM | POA: Insufficient documentation

## 2017-10-26 DIAGNOSIS — R0602 Shortness of breath: Secondary | ICD-10-CM | POA: Diagnosis not present

## 2017-10-26 DIAGNOSIS — R079 Chest pain, unspecified: Secondary | ICD-10-CM | POA: Diagnosis not present

## 2017-10-26 LAB — BASIC METABOLIC PANEL
Anion gap: 11 (ref 5–15)
BUN: 7 mg/dL (ref 6–20)
CALCIUM: 9.2 mg/dL (ref 8.9–10.3)
CO2: 24 mmol/L (ref 22–32)
Chloride: 101 mmol/L (ref 101–111)
Creatinine, Ser: 0.7 mg/dL (ref 0.44–1.00)
GFR calc non Af Amer: >60 mL/min (ref >60)
Glucose, Bld: 107 mg/dL — ABNORMAL HIGH (ref 65–99)
Potassium: 4.1 mmol/L (ref 3.5–5.1)
SODIUM: 136 mmol/L (ref 135–145)

## 2017-10-26 LAB — CBC
HCT: 42.3 % (ref 36.0–46.0)
Hemoglobin: 13.7 g/dL (ref 12.0–15.0)
MCH: 30 pg (ref 26.0–34.0)
MCHC: 32.4 g/dL (ref 30.0–36.0)
MCV: 92.8 fL (ref 78.0–100.0)
Platelets: 249 10*3/uL (ref 150–400)
RBC: 4.56 MIL/uL (ref 3.87–5.11)
RDW: 13.6 % (ref 11.5–15.5)
WBC: 6.5 10*3/uL (ref 4.0–10.5)

## 2017-10-26 LAB — I-STAT TROPONIN, ED: TROPONIN I, POC: 0 ng/mL (ref 0.00–0.08)

## 2017-10-26 MED ORDER — MORPHINE SULFATE (PF) 4 MG/ML IV SOLN
4.0000 mg | Freq: Once | INTRAVENOUS | Status: AC
  Administered 2017-10-26: 4 mg via INTRAVENOUS
  Filled 2017-10-26: qty 1

## 2017-10-26 MED ORDER — HYDROMORPHONE HCL 1 MG/ML IJ SOLN
1.0000 mg | Freq: Once | INTRAMUSCULAR | Status: AC
  Administered 2017-10-26: 1 mg via INTRAVENOUS
  Filled 2017-10-26: qty 1

## 2017-10-26 NOTE — ED Provider Notes (Signed)
[facility] EMERGENCY DEPARTMENT Provider Note   CSN: [id number] Arrival date & time: 10/26/17  1441     History   Chief Complaint No chief complaint on file.   HPI Angel [name] is a 84 y.o. female.  Patient is a 84 year old female with a history of COPD, hyperlipidemia, prior breast cancer and chronic chest wall pain who presents with chest pain.  She states she had a double mastectomy about 6-8 years ago and since that time is had chronic pain to her chest wall.  It is across her entire chest wall.  She takes Norco 2 tablets every 6 hours for pain and occasionally takes Percocet for breakthrough pain.  She states over the last week her pain is been increasing and she came in for pain management today.  She states is the same type of pain she has had in the past.  It is nonpleuritic.  It is a little bit worse with movements.  She does report some shortness of breath and nausea which she always has when her pain is worse.  She denies any increased leg swelling.  No fevers.  No cough or chest congestion.  She is been using her pain medications at home without improvement in symptoms.      Past Medical History:  Diagnosis Date  . Anxiety   . Asthma   . Breast cancer ([place]) 05/07/1999, 06/2011   a. s/p bilat mastectomies. recurrence 01/2014. Radiation, meds.  . Cancer of chest (wall) ([place])   . Cataract    eye surgery planned for 11/2014  . Chest wall recurrence of breast cancer ([place]) 2016  . Cholelithiasis   . Chronic bronchitis   . COPD (chronic obstructive pulmonary disease) ([place])   . Coronary artery disease Non-obstructive   a. 2012 Cath: LAD 40-31m->Med Rx;  b. 11/2013 Echo: EF 50-55%, nl wall motion;  c. 12/2013 Lexi MV: EF 74%, no ischemia.  . DDD (degenerative disc disease)   . Degenerative joint disease   . Depression   . Dyslipidemia   . GERD (gastroesophageal reflux disease)   . Hx of radiation therapy 07/02/1999- 08/11/1999   right  supraclavucular/axillary region: 5040 cGy, 28 fractions  . Hx of radiation therapy 02/20/14- 03/27/14   right chest wall 4500 cGy 25 sessions  . Hypertension   . Kidney stones    one stones  . Osteoarthritis   . Osteoporosis   . Pre-diabetes   . PVC's (premature ventricular contractions)   . Reflux esophagitis   . Skin cancer of face    non melanoma  . Spinal stenosis     Patient Active Problem List   Diagnosis Date Noted  . Spinal stenosis   . Skin cancer of face   . Reflux esophagitis   . PVC's (premature ventricular contractions)   . Pre-diabetes   . Osteoarthritis   . Kidney stones   . Hypertension   . Hx of radiation therapy   . GERD (gastroesophageal reflux disease)   . Depression   . Degenerative joint disease   . Coronary artery disease   . COPD (chronic obstructive pulmonary disease) ([place])   . Cholelithiasis   . Cataract   . Cancer of chest (wall) ([place])   . Breast cancer ([place])   . Anxiety   . Malignant neoplasm of overlapping sites of right breast in female, estrogen receptor positive ([place]) 10/21/2016  . Osteoporosis 02/18/2016  . Corneal scar, left eye 12/31/2015  . Frequent falls 08/02/2015  .  DOE (dyspnea on exertion) 08/02/2015  . Anxiety and depression 07/23/2015  . Anemia of chronic disease 07/23/2015  . Pain in the chest   . Nuclear sclerosis of left eye 07/17/2015  . Cystoid macular edema of right eye 11/14/2014  . Chest pain 11/01/2014  . Dyslipidemia   . Chest wall recurrence of breast cancer ([place]) 09/27/2014  . Recurrent cancer of right breast ([place]) 05/29/2014  . CAD (coronary artery disease) 05/23/2014  . Essential hypertension 05/01/2014  . Epiretinal membrane, right eye 08/22/2013  . Lamellar macular hole of right eye 08/22/2013  . Chronic pain 02/11/2011  . Asthma     Past Surgical History:  Procedure Laterality Date  . ABDOMINAL HYSTERECTOMY     in her 30's  . APPENDECTOMY    . BREAST BIOPSY Right 01/21/2014   Procedure:  BREAST/CHEST WALL BIOPSY;  Surgeon: [name], MD;  Location: [facility];  Service: General;  Laterality: Right;  . BREAST LUMPECTOMY  2002   right breast  . CARDIAC CATHETERIZATION  03/2011, 3/15  . LEFT HEART CATHETERIZATION WITH CORONARY ANGIOGRAM N/A 12/05/2013   Procedure: LEFT HEART CATHETERIZATION WITH CORONARY ANGIOGRAM;  Surgeon: [name], MD;  Location: [facility] CATH LAB;  Service: Cardiovascular;  Laterality: N/A;  . LEFT HEART CATHETERIZATION WITH CORONARY ANGIOGRAM N/A 11/06/2014   Procedure: LEFT HEART CATHETERIZATION WITH CORONARY ANGIOGRAM;  Surgeon: [name], MD;  Location[facility] CATH LAB;  Service: Cardiovascular;  Laterality: N/A;  . MASTECTOMY  07/29/11   bilateral-rt nodes-none on left  . SMALL INTESTINE SURGERY  1998   SBO  . TONSILLECTOMY    . TUBAL LIGATION  1961  . VEIN LIGATION AND STRIPPING Right   . VESICOVAGINAL FISTULA CLOSURE W/ TAH  age 60    OB History    No data available       Home Medications    Prior to Admission medications   Medication Sig Start Date End Date Taking? Authorizing Provider  albuterol (PROVENTIL HFA;VENTOLIN HFA) 108 (90 Base) MCG/ACT inhaler Inhale 1-2 puffs into the lungs every 4 (four) hours as needed for wheezing or shortness of breath. 09/29/16  Yes Pisciotta, Elmyra Ricks, PA-C  Artificial Tear Ointment (DRY EYES OP) Place 2 drops into both eyes daily as needed (dry eyes).    Yes [provider]  aspirin EC 81 MG tablet Take 2 tablets (162 mg total) by mouth daily. 05/14/15  Yes Magrinat, Virgie Dad, MD  atorvastatin (LIPITOR) 20 MG tablet TAKE ONE TABLET BY MOUTH ONCE DAILY Patient taking differently: TAKE 20 mg TABLET BY MOUTH ONCE DAILY 06/17/17  Yes Turner, Eber Hong, MD  B Complex-Biotin-FA (B-COMPLEX PO) Take 1 tablet by mouth daily.    Yes [provider]  Biotin 5000 MCG CAPS Take 1 capsule (5,000 mcg total) by mouth daily. 05/03/16  Yes Roma Schanz R, DO  budesonide-formoterol (SYMBICORT)  160-4.5 MCG/ACT inhaler Inhale 2 puffs into the lungs 2 (two) times daily as needed. Patient taking differently: Inhale 2 puffs into the lungs 2 (two) times daily as needed (wheezing, shortness of breath).  05/03/16  Yes Roma Schanz R, DO  Calcium-Magnesium-Zinc 1000-400-15 MG TABS Take 1 tablet by mouth daily.     Yes [provider]  celecoxib (CELEBREX) 200 MG capsule TAKE 1 CAPSULE DAILY WITH  FOOD FOR PAIN AND  INFLAMMATION. Need OV Patient taking differently: Take 200 mg by mouth as needed. TAKE 1 CAPSULE DAILY WITH  FOOD FOR PAIN AND  INFLAMMATION. Need OV  09/28/17  Yes Ann Held, DO  Cholecalciferol (VITAMIN D-3) 5000 UNITS TABS Take 1 tablet by mouth daily.    Yes [provider]  citalopram (CELEXA) 40 MG tablet Take 1 tablet (40 mg total) by mouth daily. Need OV 09/28/17  Yes Roma Schanz R, DO  diazepam (VALIUM) 5 MG tablet Take 1 tablet (5 mg total) by mouth every 6 (six) hours as needed. for anxiety 09/28/17  Yes Magrinat, Virgie Dad, MD  DULoxetine (CYMBALTA) 60 MG capsule TAKE 1 CAPSULE BY MOUTH  EVERY DAY FOR MOOD. Need OV Patient taking differently: Take 60 mg by mouth daily. TAKE 1 CAPSULE BY MOUTH  EVERY DAY FOR MOOD. Need OV 09/28/17  Yes Roma Schanz R, DO  ezetimibe (ZETIA) 10 MG tablet Take 1 tablet (10 mg total) by mouth daily. 04/25/15  Yes Roma Schanz R, DO  Fish Oil OIL Take 1 capsule by mouth daily.    Yes [provider]  Flaxseed, Linseed, (GROUND FLAX SEEDS PO) Take 5 mLs by mouth daily.    Yes [provider]  gabapentin (NEURONTIN) 100 MG capsule 1-2 po nightly Patient taking differently: Take 100 mg by mouth as needed. 1-2 po nightly 06/01/17  Yes Magrinat, Virgie Dad, MD  isosorbide mononitrate (IMDUR) 30 MG 24 hr tablet Take 1 tablet (30 mg total) by mouth daily. Need OV 09/28/17  Yes Roma Schanz R, DO  montelukast (SINGULAIR) 10 MG tablet Take 1 tablet (10 mg total) by mouth at bedtime. Need OV  09/28/17  Yes Roma Schanz R, DO  Multiple Vitamin (MULTIVITAMIN) tablet Take 1 tablet by mouth daily.   Yes [provider]  nitroGLYCERIN (NITROSTAT) 0.4 MG SL tablet Place 0.4 mg under the tongue as needed.   Yes [provider]  ondansetron (ZOFRAN ODT) 4 MG disintegrating tablet Take 1 tablet (4 mg total) by mouth every 8 (eight) hours as needed for nausea or vomiting. 09/07/17  Yes Magrinat, Virgie Dad, MD  oxyCODONE-acetaminophen (PERCOCET/ROXICET) 5-325 MG tablet Take 1 tablet by mouth daily as needed for pain. 09/12/16  Yes [provider]    Family History Family History  Problem Relation Age of Onset  . Pancreatic cancer Mother   . Cancer Mother        pancreatic  . Asthma Daughter   . Stroke Daughter   . Hypertension Daughter   . Heart disease Father   . Emphysema Father   . Lymphoma Brother   . Asthma Brother   . Asthma Grandchild   . Asthma Brother        deceased  . Heart disease Brother     Social History Social History   Tobacco Use  . Smoking status: Never Smoker  . Smokeless tobacco: Never Used  Substance Use Topics  . Alcohol use: Yes    Comment: occ wine  . Drug use: No     Allergies   Adhesive [tape] and Penicillins   Review of Systems Review of Systems  Constitutional: Negative for chills, diaphoresis, fatigue and fever.  HENT: Negative for congestion, rhinorrhea and sneezing.   Eyes: Negative.   Respiratory: Positive for shortness of breath. Negative for cough and chest tightness.   Cardiovascular: Positive for chest pain. Negative for leg swelling.  Gastrointestinal: Positive for nausea. Negative for abdominal pain, blood in stool, diarrhea and vomiting.  Genitourinary: Negative for difficulty urinating, flank pain, frequency and hematuria.  Musculoskeletal: Negative for arthralgias and back pain.  Skin:  Negative for rash.  Neurological: Negative for dizziness, speech difficulty, weakness, numbness and  headaches.     Physical Exam Updated Vital Signs BP (!) 118/56   Pulse (!) 59   Temp 97.6 F (36.4 C) (Oral)   Resp 17   Ht 5\' 2"  (1.575 m)   Wt 59 kg (130 lb)   SpO2 99%   BMI 23.78 kg/m   Physical Exam  Constitutional: She is oriented to person, place, and time. She appears well-developed and well-nourished.  HENT:  Head: Normocephalic and atraumatic.  Eyes: Pupils are equal, round, and reactive to light.  Neck: Normal range of motion. Neck supple.  Cardiovascular: Normal rate, regular rhythm and normal heart sounds.  Pulmonary/Chest: Effort normal and breath sounds normal. No respiratory distress. She has no wheezes. She has no rales. She exhibits no tenderness.  Abdominal: Soft. Bowel sounds are normal. There is no tenderness. There is no rebound and no guarding.  Musculoskeletal: Normal range of motion. She exhibits no edema.  Lymphadenopathy:    She has no cervical adenopathy.  Neurological: She is alert and oriented to person, place, and time.  Skin: Skin is warm and dry. No rash noted.  Psychiatric: She has a normal mood and affect.     ED Treatments / Results  Labs (all labs ordered are listed, but only abnormal results are displayed) Labs Reviewed  BASIC METABOLIC PANEL - Abnormal; Notable for the following components:      Result Value   Glucose, Bld 107 (*)    All other components within normal limits  CBC  I-STAT TROPONIN, ED    EKG  EKG Interpretation  Date/Time:  Wednesday October 26 2017 14:50:40 EST Ventricular Rate:  60 PR Interval:    QRS Duration: 79 QT Interval:  421 QTC Calculation: 421 R Axis:   72 Text Interpretation:  Sinus rhythm Minimal ST elevation, inferior leads similar to prior EKG Confirmed by Malvin Johns [id number]) on 10/26/2017 2:53:43 PM Also confirmed by Malvin Johns [phone]  on 10/26/2017 3:26:09 PM       Radiology Dg Chest 2 View  Result Date: 10/26/2017 CLINICAL DATA:  Chest pain EXAM: CHEST  2 VIEW COMPARISON:   09/29/2016 FINDINGS: Cardiac shadow is within normal limits. Thoracic aortic tortuosity is again seen as well as atherosclerotic calcifications. Elevation of the right hemidiaphragm is again noted. No acute bony abnormality is seen. The lungs are clear. IMPRESSION: No active cardiopulmonary disease. Aortic Atherosclerosis (ICD10-170.0) Electronically Signed   By: Inez Catalina M.D.   On: 10/26/2017 16:00    Procedures Procedures (including critical care time)  Medications Ordered in ED Medications  morphine 4 MG/ML injection 4 mg (not administered)  HYDROmorphone (DILAUDID) injection 1 mg (1 mg Intravenous Given 10/26/17 1616)     Initial Impression / Assessment and Plan / ED Course  I have reviewed the triage vital signs and the nursing notes.  Pertinent labs & imaging results that were available during my care of the patient were reviewed by me and considered in my medical decision making (see chart for details).     Patient is an 84 year old female who presents with pain across her chest wall.  This is a chronic pain for her.  It sounds like she is not well controlled anymore with her Norco and Percocet.  She denies any new symptoms.  Her chest x-ray is clear without pneumonia or pneumothorax.  She does not have other suggestions of pulmonary embolus.  She has no hypoxia.  Her  troponin is negative.  She has no ischemic changes on EKG.  I feel this is musculoskeletal.  She was given a dose of Dilaudid in the ED and feels better after this.  I encouraged her to follow-up with her primary care physician for ongoing pain management.  Final Clinical Impressions(s) / ED Diagnoses   Final diagnoses:  Chest wall pain    ED Discharge Orders    None       Malvin Johns, MD 10/26/17 1646

## 2017-10-26 NOTE — ED Notes (Signed)
Pt stable, ambulatory, and verbalizes understanding of d/c instructions.  

## 2017-10-26 NOTE — ED Triage Notes (Signed)
Per [facility]: Pt from home with c/o CP.  Pt reported a double mastectomy within the past 6-8 years that has caused chest wall pain.  Pt takes 2 Norco q6 hours for said pain.  Pt states she is normally able to handle pain with Norco but pain has been increasingly intense x3days with mild nausea.  PTA administration 100 mcg of Fentanyl and 4mg  Zofran.   Vitals: bp 130/62, 64 HR, 26 RR, 100% RA.

## 2017-11-08 ENCOUNTER — Other Ambulatory Visit: Payer: Self-pay | Admitting: Oncology

## 2017-11-10 DIAGNOSIS — M5136 Other intervertebral disc degeneration, lumbar region: Secondary | ICD-10-CM | POA: Diagnosis not present

## 2017-11-16 ENCOUNTER — Inpatient Hospital Stay: Payer: Medicare Other

## 2017-11-16 ENCOUNTER — Inpatient Hospital Stay: Payer: Medicare Other | Attending: Oncology

## 2017-11-16 VITALS — BP 115/79 | HR 80 | Temp 97.6°F | Resp 20

## 2017-11-16 DIAGNOSIS — C50911 Malignant neoplasm of unspecified site of right female breast: Secondary | ICD-10-CM | POA: Insufficient documentation

## 2017-11-16 DIAGNOSIS — Z5111 Encounter for antineoplastic chemotherapy: Secondary | ICD-10-CM | POA: Diagnosis not present

## 2017-11-16 DIAGNOSIS — C792 Secondary malignant neoplasm of skin: Secondary | ICD-10-CM | POA: Diagnosis not present

## 2017-11-16 DIAGNOSIS — M818 Other osteoporosis without current pathological fracture: Secondary | ICD-10-CM

## 2017-11-16 LAB — CBC WITH DIFFERENTIAL/PLATELET
Basophils Absolute: 0 10*3/uL (ref 0.0–0.1)
Basophils Relative: 0 %
EOS PCT: 2 %
Eosinophils Absolute: 0.1 10*3/uL (ref 0.0–0.5)
HCT: 40 % (ref 34.8–46.6)
HEMOGLOBIN: 13.1 g/dL (ref 11.6–15.9)
LYMPHS ABS: 1.9 10*3/uL (ref 0.9–3.3)
LYMPHS PCT: 35 %
MCH: 30.3 pg (ref 25.1–34.0)
MCHC: 32.8 g/dL (ref 31.5–36.0)
MCV: 92.6 fL (ref 79.5–101.0)
Monocytes Absolute: 0.4 10*3/uL (ref 0.1–0.9)
Monocytes Relative: 8 %
NEUTROS PCT: 55 %
Neutro Abs: 3 10*3/uL (ref 1.5–6.5)
PLATELETS: 248 10*3/uL (ref 145–400)
RBC: 4.32 MIL/uL (ref 3.70–5.45)
RDW: 13.3 % (ref 11.2–14.5)
WBC: 5.4 10*3/uL (ref 3.9–10.3)

## 2017-11-16 LAB — COMPREHENSIVE METABOLIC PANEL
ALK PHOS: 31 U/L — AB (ref 40–150)
ALT: 8 U/L (ref 0–55)
AST: 14 U/L (ref 5–34)
Albumin: 3.8 g/dL (ref 3.5–5.0)
Anion gap: 10 (ref 3–11)
BUN: 11 mg/dL (ref 7–26)
CHLORIDE: 96 mmol/L — AB (ref 98–109)
CO2: 27 mmol/L (ref 22–29)
CREATININE: 0.83 mg/dL (ref 0.60–1.10)
Calcium: 9.7 mg/dL (ref 8.4–10.4)
GFR calc Af Amer: >60 mL/min (ref >60)
Glucose, Bld: 96 mg/dL (ref 70–140)
Potassium: 4.6 mmol/L (ref 3.5–5.1)
SODIUM: 133 mmol/L — AB (ref 136–145)
Total Bilirubin: 0.5 mg/dL (ref 0.2–1.2)
Total Protein: 6.6 g/dL (ref 6.4–8.3)

## 2017-11-16 MED ORDER — FULVESTRANT 250 MG/5ML IM SOLN
500.0000 mg | Freq: Once | INTRAMUSCULAR | Status: AC
  Administered 2017-11-16: 500 mg via INTRAMUSCULAR

## 2017-11-16 MED ORDER — FULVESTRANT 250 MG/5ML IM SOLN
INTRAMUSCULAR | Status: AC
  Filled 2017-11-16: qty 5

## 2017-11-16 NOTE — Patient Instructions (Signed)

## 2017-11-18 DIAGNOSIS — G894 Chronic pain syndrome: Secondary | ICD-10-CM | POA: Diagnosis not present

## 2017-11-18 DIAGNOSIS — M545 Low back pain: Secondary | ICD-10-CM | POA: Diagnosis not present

## 2017-11-18 DIAGNOSIS — M542 Cervicalgia: Secondary | ICD-10-CM | POA: Diagnosis not present

## 2017-11-18 DIAGNOSIS — M5030 Other cervical disc degeneration, unspecified cervical region: Secondary | ICD-10-CM | POA: Diagnosis not present

## 2017-11-18 DIAGNOSIS — M5136 Other intervertebral disc degeneration, lumbar region: Secondary | ICD-10-CM | POA: Diagnosis not present

## 2017-11-21 ENCOUNTER — Other Ambulatory Visit: Payer: Self-pay | Admitting: Oncology

## 2017-11-22 ENCOUNTER — Other Ambulatory Visit: Payer: Self-pay

## 2017-11-22 ENCOUNTER — Other Ambulatory Visit: Payer: Self-pay | Admitting: Oncology

## 2017-11-22 DIAGNOSIS — C50919 Malignant neoplasm of unspecified site of unspecified female breast: Secondary | ICD-10-CM

## 2017-11-22 DIAGNOSIS — C7989 Secondary malignant neoplasm of other specified sites: Principal | ICD-10-CM

## 2017-11-22 MED ORDER — DIAZEPAM 5 MG PO TABS: 5.0000 mg | ORAL_TABLET | Freq: Four times a day (QID) | ORAL | 1 refills | Status: DC | PRN

## 2017-11-22 MED ORDER — ONDANSETRON 4 MG PO TBDP: 4.0000 mg | ORAL_TABLET | Freq: Three times a day (TID) | ORAL | 1 refills | Status: DC | PRN

## 2017-11-23 ENCOUNTER — Other Ambulatory Visit: Payer: Self-pay

## 2017-11-23 MED ORDER — NITROGLYCERIN 0.4 MG SL SUBL: 0.4000 mg | SUBLINGUAL_TABLET | SUBLINGUAL | 3 refills | Status: AC | PRN

## 2017-11-27 NOTE — Progress Notes (Deleted)
Cardiology Office Note:    Date:  11/27/2017   ID:  Angel [name], DOB November 05, 1931, MRN [id number]  PCP:  Ann Held, DO  Cardiologist:  No primary care provider on file.    Referring MD: Carollee Herter, Alferd Apa, *   No chief complaint on file.   History of Present Illness:    Angel [name] is a 84 y.o. female with a hx of non-obstructive CAD with mid LAD 40-50% stenosis, DDD, HL, metastatic breast CA and GERD.  She has chronic stable angina but has been intolerant to BB and Ranexa due to severe fatigue.  She is here today for followup and is doing well.  She denies any chest pain or pressure, SOB, DOE, PND, orthopnea, LE edema, dizziness, palpitations or syncope. She is compliant with her meds and is tolerating meds with no SE.    Past Medical History:  Diagnosis Date  . Anxiety   . Asthma   . Breast cancer ([place]) 05/07/1999, 06/2011   a. s/p bilat mastectomies. recurrence 01/2014. Radiation, meds.  . Cancer of chest (wall) ([place])   . Cataract    eye surgery planned for 11/2014  . Chest wall recurrence of breast cancer ([place]) 2016  . Cholelithiasis   . Chronic bronchitis   . COPD (chronic obstructive pulmonary disease) ([place])   . Coronary artery disease Non-obstructive   a. 2012 Cath: LAD 40-37m->Med Rx;  b. 11/2013 Echo: EF 50-55%, nl wall motion;  c. 12/2013 Lexi MV: EF 74%, no ischemia.  . DDD (degenerative disc disease)   . Degenerative joint disease   . Depression   . Dyslipidemia   . GERD (gastroesophageal reflux disease)   . Hx of radiation therapy 07/02/1999- 08/11/1999   right supraclavucular/axillary region: 5040 cGy, 28 fractions  . Hx of radiation therapy 02/20/14- 03/27/14   right chest wall 4500 cGy 25 sessions  . Hypertension   . Kidney stones    one stones  . Osteoarthritis   . Osteoporosis   . Pre-diabetes   . PVC's (premature ventricular contractions)   . Reflux esophagitis   . Skin cancer of face    non melanoma  . Spinal stenosis      Past Surgical History:  Procedure Laterality Date  . ABDOMINAL HYSTERECTOMY     in her 30's  . APPENDECTOMY    . BREAST BIOPSY Right 01/21/2014   Procedure: BREAST/CHEST WALL BIOPSY;  Surgeon: [name], MD;  Location: [facility];  Service: General;  Laterality: Right;  . BREAST LUMPECTOMY  2002   right breast  . CARDIAC CATHETERIZATION  03/2011, 3/15  . LEFT HEART CATHETERIZATION WITH CORONARY ANGIOGRAM N/A 12/05/2013   Procedure: LEFT HEART CATHETERIZATION WITH CORONARY ANGIOGRAM;  Surgeon: [name], MD;  Location: [facility] CATH LAB;  Service: Cardiovascular;  Laterality: N/A;  . LEFT HEART CATHETERIZATION WITH CORONARY ANGIOGRAM N/A 11/06/2014   Procedure: LEFT HEART CATHETERIZATION WITH CORONARY ANGIOGRAM;  Surgeon: [name], MD;  Location: [facility] CATH LAB;  Service: Cardiovascular;  Laterality: N/A;  . MASTECTOMY  07/29/11   bilateral-rt nodes-none on left  . SMALL INTESTINE SURGERY  1998   SBO  . TONSILLECTOMY    . TUBAL LIGATION  1961  . VEIN LIGATION AND STRIPPING Right   . VESICOVAGINAL FISTULA CLOSURE W/ TAH  age 74    Current Medications: No outpatient medications have been marked as taking for the 11/30/17 encounter (Appointment) with [name], MD.     Allergies:  Adhesive [tape] and Penicillins   Social History   Socioeconomic History  . Marital status: Single    Spouse name: Not on file  . Number of children: 4  . Years of education: Not on file  . Highest education level: Not on file  Social Needs  . Financial resource strain: Not on file  . Food insecurity - worry: Not on file  . Food insecurity - inability: Not on file  . Transportation needs - medical: Not on file  . Transportation needs - non-medical: Not on file  Occupational History  . Occupation: Retired    Fish farm manager: OTHER    Comment: Nursing  Tobacco Use  . Smoking status: Never Smoker  . Smokeless tobacco: Never Used  Substance and Sexual Activity  . Alcohol use: Yes     Comment: occ wine  . Drug use: No  . Sexual activity: Not on file  Other Topics Concern  . Not on file  Social History Narrative  . Not on file     Family History: The patient's family history includes Asthma in her brother, brother, daughter, and grandchild; Cancer in her mother; Emphysema in her father; Heart disease in her brother and father; Hypertension in her daughter; Lymphoma in her brother; Pancreatic cancer in her mother; Stroke in her daughter.  ROS:   Please see the history of present illness.    ROS  All other systems reviewed and negative.   EKGs/Labs/Other Studies Reviewed:    The following studies were reviewed today: none  EKG:  EKG is  ordered today.  The ekg ordered today demonstrates ***  Recent Labs: 11/16/2017: ALT 8; BUN 11; Creatinine, Ser 0.83; Hemoglobin 13.1; Platelets 248; Potassium 4.6; Sodium 133   Recent Lipid Panel    Component Value Date/Time   CHOL 217 (H) 05/03/2016 1141   TRIG 110.0 05/03/2016 1141   HDL 57.50 05/03/2016 1141   CHOLHDL 4 05/03/2016 1141   VLDL 22.0 05/03/2016 1141   LDLCALC 138 (H) 05/03/2016 1141    Physical Exam:    VS:  There were no vitals taken for this visit.    Wt Readings from Last 3 Encounters:  10/26/17 130 lb (59 kg)  08/24/17 131 lb 14.4 oz (59.8 kg)  02/09/17 132 lb 3.2 oz (60 kg)     GEN:  Well nourished, well developed in no acute distress HEENT: Normal NECK: No JVD; No carotid bruits LYMPHATICS: No lymphadenopathy CARDIAC: RRR, no murmurs, rubs, gallops RESPIRATORY:  Clear to auscultation without rales, wheezing or rhonchi  ABDOMEN: Soft, non-tender, non-distended MUSCULOSKELETAL:  No edema; No deformity  SKIN: Warm and dry NEUROLOGIC:  Alert and oriented x 3 PSYCHIATRIC:  Normal affect   ASSESSMENT:    1. Coronary artery disease involving native coronary artery of native heart without angina pectoris   2. Essential hypertension   3. PVC's (premature ventricular contractions)   4.  Dyslipidemia    PLAN:    In order of problems listed above:  1.  ASCAD -cath with  mid LAD 40-50% stenosis.  She has chronic stable angina but has been intolerant to BB and Ranexa due to severe fatigue.  She has not had any anginal sx recently.  She will continue on ASA 81mg  daily, Imdur 30mg  daily and atorvastatin 20mg  daily.    2.  HTN - BP is well controlled on exam today.  She is currently not on antihypertensive meds.   3.  PVCs - asymptomatic.   4.  Hyperlipidemia - LDL  goal is < 70.  She will continue on atorvastatin 20mg  daily.  I will get an FLP and ALT.    Medication Adjustments/Labs and Tests Ordered: Current medicines are reviewed at length with the patient today.  Concerns regarding medicines are outlined above.  No orders of the defined types were placed in this encounter.  No orders of the defined types were placed in this encounter.   Signed, Fransico Him, MD  11/27/2017 10:55 PM    [place]

## 2017-11-30 ENCOUNTER — Ambulatory Visit: Payer: Medicare Other | Admitting: Cardiology

## 2017-12-14 ENCOUNTER — Inpatient Hospital Stay: Payer: Medicare Other

## 2017-12-14 ENCOUNTER — Telehealth: Payer: Self-pay | Admitting: *Deleted

## 2017-12-14 ENCOUNTER — Inpatient Hospital Stay: Payer: Medicare Other | Attending: Oncology

## 2017-12-14 VITALS — BP 110/65 | HR 67 | Temp 97.9°F | Resp 18

## 2017-12-14 DIAGNOSIS — F329 Major depressive disorder, single episode, unspecified: Secondary | ICD-10-CM

## 2017-12-14 DIAGNOSIS — C792 Secondary malignant neoplasm of skin: Secondary | ICD-10-CM | POA: Insufficient documentation

## 2017-12-14 DIAGNOSIS — C50911 Malignant neoplasm of unspecified site of right female breast: Secondary | ICD-10-CM | POA: Diagnosis not present

## 2017-12-14 DIAGNOSIS — F32A Depression, unspecified: Secondary | ICD-10-CM

## 2017-12-14 DIAGNOSIS — J4520 Mild intermittent asthma, uncomplicated: Secondary | ICD-10-CM

## 2017-12-14 DIAGNOSIS — G894 Chronic pain syndrome: Secondary | ICD-10-CM

## 2017-12-14 DIAGNOSIS — Z5111 Encounter for antineoplastic chemotherapy: Secondary | ICD-10-CM | POA: Insufficient documentation

## 2017-12-14 DIAGNOSIS — M818 Other osteoporosis without current pathological fracture: Secondary | ICD-10-CM

## 2017-12-14 DIAGNOSIS — I2510 Atherosclerotic heart disease of native coronary artery without angina pectoris: Secondary | ICD-10-CM

## 2017-12-14 LAB — COMPREHENSIVE METABOLIC PANEL
ALBUMIN: 3.5 g/dL (ref 3.5–5.0)
ALK PHOS: 31 U/L — AB (ref 40–150)
ALT: 6 U/L (ref 0–55)
AST: 14 U/L (ref 5–34)
Anion gap: 9 (ref 3–11)
BUN: 8 mg/dL (ref 7–26)
CALCIUM: 9.4 mg/dL (ref 8.4–10.4)
CHLORIDE: 101 mmol/L (ref 98–109)
CO2: 26 mmol/L (ref 22–29)
CREATININE: 0.73 mg/dL (ref 0.60–1.10)
GFR calc non Af Amer: >60 mL/min (ref >60)
Glucose, Bld: 90 mg/dL (ref 70–140)
Potassium: 4.1 mmol/L (ref 3.5–5.1)
SODIUM: 136 mmol/L (ref 136–145)
Total Bilirubin: 0.3 mg/dL (ref 0.2–1.2)
Total Protein: 6.2 g/dL — ABNORMAL LOW (ref 6.4–8.3)

## 2017-12-14 LAB — CBC WITH DIFFERENTIAL/PLATELET
BASOS PCT: 1 %
Basophils Absolute: 0 10*3/uL (ref 0.0–0.1)
EOS ABS: 0 10*3/uL (ref 0.0–0.5)
EOS PCT: 1 %
HCT: 39.1 % (ref 34.8–46.6)
HEMOGLOBIN: 13 g/dL (ref 11.6–15.9)
LYMPHS ABS: 1.5 10*3/uL (ref 0.9–3.3)
Lymphocytes Relative: 31 %
MCH: 30.6 pg (ref 25.1–34.0)
MCHC: 33.1 g/dL (ref 31.5–36.0)
MCV: 92.4 fL (ref 79.5–101.0)
Monocytes Absolute: 0.4 10*3/uL (ref 0.1–0.9)
Monocytes Relative: 8 %
NEUTROS PCT: 59 %
Neutro Abs: 2.8 10*3/uL (ref 1.5–6.5)
PLATELETS: 205 10*3/uL (ref 145–400)
RBC: 4.23 MIL/uL (ref 3.70–5.45)
RDW: 13.8 % (ref 11.2–14.5)
WBC: 4.7 10*3/uL (ref 3.9–10.3)

## 2017-12-14 MED ORDER — MONTELUKAST SODIUM 10 MG PO TABS: 10.0000 mg | ORAL_TABLET | Freq: Every day | ORAL | 0 refills | Status: DC

## 2017-12-14 MED ORDER — FULVESTRANT 250 MG/5ML IM SOLN
500.0000 mg | Freq: Once | INTRAMUSCULAR | Status: AC
  Administered 2017-12-14: 500 mg via INTRAMUSCULAR

## 2017-12-14 MED ORDER — CITALOPRAM HYDROBROMIDE 40 MG PO TABS: 40.0000 mg | ORAL_TABLET | Freq: Every day | ORAL | 0 refills | Status: DC

## 2017-12-14 MED ORDER — DULOXETINE HCL 60 MG PO CPEP: ORAL_CAPSULE | ORAL | 0 refills | Status: DC

## 2017-12-14 MED ORDER — ISOSORBIDE MONONITRATE ER 30 MG PO TB24: 30.0000 mg | ORAL_TABLET | Freq: Every day | ORAL | 0 refills | Status: DC

## 2017-12-14 MED ORDER — CELECOXIB 200 MG PO CAPS: ORAL_CAPSULE | ORAL | 0 refills | Status: DC

## 2017-12-14 NOTE — Telephone Encounter (Signed)
Received refill requests for medications.  Patient has not been seen since 2017.  Patient notified that she needs to make an appointment before next refills can be done.

## 2017-12-14 NOTE — Patient Instructions (Signed)

## 2017-12-21 ENCOUNTER — Telehealth: Payer: Self-pay | Admitting: *Deleted

## 2017-12-21 MED ORDER — DIAZEPAM 5 MG PO TABS: 5.0000 mg | ORAL_TABLET | Freq: Four times a day (QID) | ORAL | 1 refills | Status: DC | PRN

## 2017-12-21 NOTE — Telephone Encounter (Signed)
This RN returned call per VM left by the patient's daughter - Cathie Hoops.  Per message [name] states " Dr Jana Hakim orders mom's diazepam and she takes it three times a day - but he only gives her 45 tablets so that is just a 2 week supply - and it is difficult for mom to have to go to the pharmacy every 15 days to pick up a prescription could he give her a month's supply instead "  " she also is unable to take the gabapentin- that causes her a lot of confusion "  Return call number given as [phone].  Call returned and pt answered the phone - above discussed with Savina stating she takes " Oh I usually take 1 tablet at night to help me go to sleep - I really don't like to take a lot of it "  She states her bottle has fill date of 11/23/2017 and she currently has 5 tablets left.  She also verified that she is not taking the gabapentin " oh I took 2 doses and got so confused - I just threw the tablets away "  Pt states she does need a refill of diazepam prior to scheduled appointment on 01/11/2018.  This RN contacted pt's pharmacy to verify last fill date with prescription being filled today ( it has not been picked up).  Note this RN had printed a prescription to fax but will destroy the hard copy due to pt has medication available for pick up.

## 2017-12-21 NOTE — Telephone Encounter (Signed)
See other noted

## 2017-12-26 DIAGNOSIS — I480 Paroxysmal atrial fibrillation: Secondary | ICD-10-CM

## 2017-12-26 HISTORY — DX: Paroxysmal atrial fibrillation: I48.0

## 2018-01-09 NOTE — Progress Notes (Signed)
Angel [name]  [phone] [phone]   ID: Angel [name] DOB: 24-Jul-1932  MR#: [id number]  JJK#:[id number]  Patient Care Team: Carollee Herter, Alferd Apa, DO as PCP - General (Family Medicine) Gurshan Settlemire, Virgie Dad, MD as Consulting Physician (Oncology) [name], MD as Consulting Physician (Cardiology) OTHER M.D.: Maisie Fus Kurup (ophth)  CHIEF COMPLAINT: Chest wall recurrence of breast cancer  CURRENT TREATMENT: Fulvestrant, Denosumab/Prolia  BREAST CANCER HISTORY: Per Dr. 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 She had a second right breast cancer in 2012, with right mastectomy by Dr [name] 07-29-2011 for T1N0M0 ER/PR positive, HER-2 negative invasive ductal carcinoma; she also had what was planned as prophylactic left mastectomy, with unexpected finding of left DCIS in that pathology. Plan in 09-2011 was to begin tamoxifen, chosen particularly due to low bone density. Patient did not keep follow up visits at this office, stopped tamoxifen due intolerance (tho she does not remember what problem she had with this) and was treated with raloxifene by Dr [name].  She was seen next at this office on 12-26-2013 with an enlarging nodular area above right mastectomy scar,reportedly present for several months. Excisional biopsy done 01-21-14 found grade 2 invasive ductal carcinoma with involvement at [facility]) and lymphovascular space invasion, ER positive 100%, PR positive 75%, proliferation marker 80% and HER-2 negative by [facility] (pathology [phone]). PET 02-15-14 did not identify any involvement beyond right chest wall. She had 4500 cGy as electron beam radiation from 5-27 thru 03-27-14 to right chest wall. By 04-30-14 there were multiple tiny nodules in region of right mastectomy scar."  The patient's subsequent history is as detailed  below  INTERVAL HISTORY: Angel [name] returns today for follow-up of her estrogen receptor positive breast cancer accompanied by her friend. She is receiving fulvestrant every 28 days. She tolerates this well.   She receives denosumab/Prolia every 6 months, with a dose due today. She also tolerates this well. She denies having pain.    REVIEW OF SYSTEMS: Tasfia reports that she has been very busy with her daily activities. Her daily routine is usually the same. She has some burning and pain in her chest. She was beginning to experience confusion while taking gabapentin. Dr. Nelva Bush is giving her percocet once at night. She sleeps well during the night. She denies constipation. She has a healthy diet. She denies unusual headaches, visual changes, nausea, vomiting, or dizziness. There has been no unusual cough, phlegm production, or pleurisy. This been no change in bowel or bladder habits. She denies unexplained fatigue or unexplained weight loss, bleeding, rash, or fever. A detailed review of systems was otherwise stable.   PAST MEDICAL HISTORY: Past Medical History:  Diagnosis Date  . Anxiety   . Asthma   . Breast cancer ([place]) 05/07/1999, 06/2011   a. s/p bilat mastectomies. recurrence 01/2014. Radiation, meds.  . Cancer of chest (wall) ([place])   . Cataract    eye surgery planned for 11/2014  . Chest wall recurrence of breast cancer ([place]) 2016  . Cholelithiasis   . Chronic bronchitis   . COPD (chronic obstructive pulmonary disease) ([place])   . Coronary artery disease Non-obstructive   a. 2012 Cath: LAD 40-44m>Med Rx;  b. 11/2013 Echo: EF 50-55%, nl wall motion;  c. 12/2013 Lexi MV: EF 74%, no ischemia.  . DDD (degenerative disc disease)   . Degenerative joint disease   . Depression   .  Dyslipidemia   . GERD (gastroesophageal reflux disease)   . Hx of radiation therapy 07/02/1999- 08/11/1999   right supraclavucular/axillary region: 5040 cGy, 28 fractions  . Hx of radiation therapy 02/20/14- 03/27/14    right chest wall 4500 cGy 25 sessions  . Hypertension   . Kidney stones    one stones  . Osteoarthritis   . Osteoporosis   . Pre-diabetes   . PVC's (premature ventricular contractions)   . Reflux esophagitis   . Skin cancer of face    non melanoma  . Spinal stenosis     PAST SURGICAL HISTORY: Past Surgical History:  Procedure Laterality Date  . ABDOMINAL HYSTERECTOMY     in her 30's  . APPENDECTOMY    . BREAST BIOPSY Right 01/21/2014   Procedure: BREAST/CHEST WALL BIOPSY;  Surgeon: [name], MD;  Location: [facility];  Service: General;  Laterality: Right;  . BREAST LUMPECTOMY  2002   right breast  . CARDIAC CATHETERIZATION  03/2011, 3/15  . LEFT HEART CATHETERIZATION WITH CORONARY ANGIOGRAM N/A 12/05/2013   Procedure: LEFT HEART CATHETERIZATION WITH CORONARY ANGIOGRAM;  Surgeon: [name], MD;  Location[facility] C[facility] LAB;  Service: Cardiovascular;  Laterality: N/A;  . LEFT HEART CATHETERIZATION WITH CORONARY ANGIOGRAM N/A 11/06/2014   Procedure: LEFT HEART CATHETERIZATION WITH CORONARY ANGIOGRAM;  Surgeon: [name], MD;  Location: [facility] CATH LAB;  Service: Cardiovascular;  Laterality: N/A;  . MASTECTOMY  07/29/11   bilateral-rt nodes-none on left  . SMALL INTESTINE SURGERY  1998   SBO  . TONSILLECTOMY    . TUBAL LIGATION  1961  . VEIN LIGATION AND STRIPPING Right   . VESICOVAGINAL FISTULA CLOSURE W/ TAH  age 84    FAMILY HISTORY Family History  Problem Relation Age of Onset  . Pancreatic cancer Mother   . Cancer Mother        pancreatic  . Asthma Daughter   . Stroke Daughter   . Hypertension Daughter   . Heart disease Father   . Emphysema Father   . Lymphoma Brother   . Asthma Brother   . Asthma Grandchild   . Asthma Brother        deceased  . Heart disease Brother    the patient's father died at the age of 80 from heart disease. He was a smoker. The patient's mother died at the age of 22 with cancer of the pancreas. The patient has 3 brothers,  no sisters. There is no history of breast or ovarian cancer in the family to her knowledge.  GYNECOLOGIC HISTORY:  No LMP recorded. Patient has had a hysterectomy. Menarche age 66, first live birth age 54. She is GX P4. She stopped having periods in her 2s. She took hormone replacement for approximately 10 years. 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 She has been "single" for 40 years. Her oldest daughter died from a subarachnoid hemorrhage at age 64, 2 years ago. This is when the patient had undergone treatment for invasive breast cancer and she tells me she was simply overwhelmed and could not start the planned antiestrogen therapy. The next daughter, [name], is a retired Software engineer. She lives in [place]. Next child, Freda Munro, works in Charity fundraiser. She also lives in [place]. The youngest, [name], is retired from the Office manager position. He also owned his own business. The patient has 7 grandchildren and 2 many great-grandchildren  to count. She attends the [employer]    ADVANCED DIRECTIVES: In place; the patient's daughter Freda Munro is her healthcare power of attorney. She can be reached at [place]: Social History   Tobacco Use  . Smoking status: Never Smoker  . Smokeless tobacco: Never Used  Substance Use Topics  . Alcohol use: Yes    Comment: occ wine  . Drug use: No     Colonoscopy:  PAP:  Bone density: August 2015   Lipid panel:  Allergies  Allergen Reactions  . Adhesive [Tape] Other (See Comments)    Pt prefers to use paper tape  . Penicillins Rash    Has patient had a PCN reaction causing immediate rash, facial/tongue/throat swelling, SOB or lightheadedness with hypotension: Yes Has patient had a PCN reaction causing severe rash involving mucus membranes or skin necrosis: No Has patient had a PCN  reaction that required hospitalization No Has patient had a PCN reaction occurring within the last 10 years: No If all of the above answers are "NO", then may proceed with Cephalosporin use.     Current Outpatient Medications  Medication Sig Dispense Refill  . albuterol (PROVENTIL HFA;VENTOLIN HFA) 108 (90 Base) MCG/ACT inhaler Inhale 1-2 puffs into the lungs every 4 (four) hours as needed for wheezing or shortness of breath. 1 Inhaler 0  . Artificial Tear Ointment (DRY EYES OP) Place 2 drops into both eyes daily as needed (dry eyes).     [name] aspirin EC 81 MG tablet Take 2 tablets (162 mg total) by mouth daily.    [name] atorvastatin (LIPITOR) 20 MG tablet TAKE ONE TABLET BY MOUTH ONCE DAILY (Patient taking differently: TAKE 20 mg TABLET BY MOUTH ONCE DAILY) 30 tablet 0  . B Complex-Biotin-FA (B-COMPLEX PO) Take 1 tablet by mouth daily.     . Biotin 5000 MCG CAPS Take 1 capsule (5,000 mcg total) by mouth daily. 90 capsule 3  . budesonide-formoterol (SYMBICORT) 160-4.5 MCG/ACT inhaler Inhale 2 puffs into the lungs 2 (two) times daily as needed. (Patient taking differently: Inhale 2 puffs into the lungs 2 (two) times daily as needed (wheezing, shortness of breath). ) 3 Inhaler 1  . Calcium-Magnesium-Zinc 1000-400-15 MG TABS Take 1 tablet by mouth daily.      . celecoxib (CELEBREX) 200 MG capsule TAKE 1 CAPSULE DAILY WITH  FOOD FOR PAIN AND  INFLAMMATION. Need OV 90 capsule 0  . Cholecalciferol (VITAMIN D-3) 5000 UNITS TABS Take 1 tablet by mouth daily.     . citalopram (CELEXA) 40 MG tablet Take 1 tablet (40 mg total) by mouth daily. Need OV 90 tablet 0  . DULoxetine (CYMBALTA) 60 MG capsule TAKE 1 CAPSULE BY MOUTH  EVERY DAY FOR MOOD. Need OV 90 capsule 0  . ezetimibe (ZETIA) 10 MG tablet Take 1 tablet (10 mg total) by mouth daily.    . Fish Oil OIL Take 1 capsule by mouth daily.     . Flaxseed, Linseed, (GROUND FLAX SEEDS PO) Take 5 mLs by mouth daily.     [name] gabapentin (NEURONTIN) 100 MG capsule 1-2  po nightly (Patient taking differently: Take 100 mg by mouth as needed. 1-2 po nightly) 60 capsule 3  . isosorbide mononitrate (IMDUR) 30 MG 24 hr tablet Take 1 tablet (30 mg total) by mouth daily. Need OV 90 tablet 0  . montelukast (SINGULAIR) 10 MG tablet Take 1 tablet (10 mg total) by mouth at bedtime. Need OV 90 tablet 0  .  Multiple Vitamin (MULTIVITAMIN) tablet Take 1 tablet by mouth daily.    . nitroGLYCERIN (NITROSTAT) 0.4 MG SL tablet Place 1 tablet (0.4 mg total) under the tongue as needed. 25 tablet 3  . ondansetron (ZOFRAN ODT) 4 MG disintegrating tablet Take 1 tablet (4 mg total) by mouth every 8 (eight) hours as needed for nausea or vomiting. 20 tablet 1  . oxyCODONE-acetaminophen (PERCOCET/ROXICET) 5-325 MG tablet Take 1 tablet by mouth daily as needed for pain.     No current facility-administered medications for this visit.     OBJECTIVE: Elderly white woman who appears well  Vitals:   01/11/18 1408  BP: 121/76  Pulse: 60  Resp: 18  Temp: 97.7 F (36.5 C)  SpO2: 93%     Body mass index is 21.89 kg/m.    ECOG FS:1 - Symptomatic but completely ambulatory  Sclerae unicteric, pupils round and equal No cervical or supraclavicular adenopathy Lungs no rales or rhonchi Heart regular rate and rhythm Abd soft, nontender, positive bowel sounds MSK no focal spinal tenderness, no upper extremity lymphedema Neuro: nonfocal, well oriented, appropriate affect Breasts: Status post bilateral mastectomies.  Is no evidence of chest wall recurrence.  Both axillae are benign.  LAB RESULTS:  CMP     Component Value Date/Time   NA 136 12/14/2017 1331   NA 139 09/22/2017 1353   K 4.1 12/14/2017 1331   K 3.4 (L) 09/22/2017 1353   CL 101 12/14/2017 1331   CO2 26 12/14/2017 1331   CO2 27 09/22/2017 1353   GLUCOSE 90 12/14/2017 1331   GLUCOSE 108 09/22/2017 1353   BUN 8 12/14/2017 1331   BUN 8.5 09/22/2017 1353   CREATININE 0.73 12/14/2017 1331   CREATININE 0.8 09/22/2017 1353    CALCIUM 9.4 12/14/2017 1331   CALCIUM 9.5 09/22/2017 1353   PROT 6.2 (L) 12/14/2017 1331   PROT 6.8 09/22/2017 1353   ALBUMIN 3.5 12/14/2017 1331   ALBUMIN 4.0 09/22/2017 1353   AST 14 12/14/2017 1331   AST 19 09/22/2017 1353   ALT 6 12/14/2017 1331   ALT 11 09/22/2017 1353   ALKPHOS 31 (L) 12/14/2017 1331   ALKPHOS 34 (L) 09/22/2017 1353   BILITOT 0.3 12/14/2017 1331   BILITOT 0.32 09/22/2017 1353   GFRNONAA >60 12/14/2017 1331   GFRNONAA 73 05/01/2014 1050   GFRAA >60 12/14/2017 1331   GFRAA 84 05/01/2014 1050    I No results found for: SPEP  Lab Results  Component Value Date   WBC 6.1 01/11/2018   NEUTROABS 3.6 01/11/2018   HGB 11.9 01/11/2018   HCT 37.3 01/11/2018   MCV 94.7 01/11/2018   PLT 267 01/11/2018      Chemistry      Component Value Date/Time   NA 136 12/14/2017 1331   NA 139 09/22/2017 1353   K 4.1 12/14/2017 1331   K 3.4 (L) 09/22/2017 1353   CL 101 12/14/2017 1331   CO2 26 12/14/2017 1331   CO2 27 09/22/2017 1353   BUN 8 12/14/2017 1331   BUN 8.5 09/22/2017 1353   CREATININE 0.73 12/14/2017 1331   CREATININE 0.8 09/22/2017 1353      Component Value Date/Time   CALCIUM 9.4 12/14/2017 1331   CALCIUM 9.5 09/22/2017 1353   ALKPHOS 31 (L) 12/14/2017 1331   ALKPHOS 34 (L) 09/22/2017 1353   AST 14 12/14/2017 1331   AST 19 09/22/2017 1353   ALT 6 12/14/2017 1331   ALT 11 09/22/2017 1353   BILITOT 0.3  12/14/2017 1331   BILITOT 0.32 09/22/2017 1353       Lab Results  Component Value Date   LABCA2 12 11/26/2015    No components found for: [id number]  No results for input(s): INR in the last 168 hours.  Urinalysis    Component Value Date/Time   COLORURINE YELLOW 09/29/2016 1315   APPEARANCEUR CLEAR 09/29/2016 1315   LABSPEC 1.011 09/29/2016 1315   PHURINE 6.0 09/29/2016 1315   GLUCOSEU NEGATIVE 09/29/2016 1315   HGBUR NEGATIVE 09/29/2016 1315   BILIRUBINUR NEGATIVE 09/29/2016 1315   BILIRUBINUR neg 04/25/2015 1015   KETONESUR  NEGATIVE 09/29/2016 1315   PROTEINUR NEGATIVE 09/29/2016 1315   UROBILINOGEN 0.2 04/25/2015 1015   UROBILINOGEN 0.2 02/14/2015 2010   NITRITE NEGATIVE 09/29/2016 1315   LEUKOCYTESUR NEGATIVE 09/29/2016 1315    STUDIES: No results found.  ASSESSMENT: 84 y.o. [place] woman with locally recurrent breast cancer, as follows:  (1) status post right lumpectomy and sentinel node sampling September 2000 for ductal carcinoma in situ (Ni+), estrogen and progesterone receptor positive, with a low proliferation index,  (a) received 5040 cGy to the right breast and regional lymph nodes  (b) on tamoxifen less than 3 months  (2) s/p bilateral mastectomies and right axillary lymph node sampling 07/29/2011, showing  (a) in the left breast, ductal carcinoma in situ measuring 1.2 cm, grade 1, with negative margins, estrogen and progesterone receptor positive  (b)  on the right, a pT1c pN0, stage IA invasive ductal carcinoma, estrogen receptor and 93% positive, progesterone receptor 87% positive, with an MIB-1 of 35%, and no HER-2 amplification margins were close but negative  (c) did not receive adjuvant antiestrogen therapy  (3) right chest wall skin recurrence resected 01/21/2014, measuring 1.7 cm, with positive margins, estrogen receptor 100% positive, progesterone receptor 75% positive, with an MIB-1 of 80%, and no HER-2 amplification; PET scan 02/15/2014 negative except for Right chest wall focus  (a) received 45 Gy electron-beam therapy to the right chest wall completed 03/27/2014  (b) skin progression within and without the radiation field noted 04/30/2014  (c) letrozole started 05/13/2014, discontinued March 2016 with progression   (4) dexa scan 05/09/2014 at [facility] shows osteoporosis with a T score of -2.9  (5) Fulvestrant started 12/25/2014  (6) osteopenia, on DEXA scan 05/09/2014  (a) started Denosumab/prolia 08/06/2015, repeated every 24 weeks  (7) Chronic pain: Followed by Dr.  Nelva Bush  PLAN: I spent approximately 40 minutes with Pamala Hurry and her daughter.  They had many questions regarding not only the patient's but also the daughters breast cancer risk and the daughter's use of bioidentical hormones.  The patient herself had many questions regarding her chest wall pain and what her options are in addition to what she is receiving in terms of pain control for Dr. Dossie Der.  We have been giving her 45 tablets of Valium but she tells me that she is taking the Valium more frequently now so she wanted that dose upped.  I checked in PMP aware and I do not notice any irregularities.  I went ahead and wrote her for Valium 60 tablets to take up to twice a day as needed  As far as breast cancer is concerned she is now 4 years out from her skin recurrence with no evidence of disease activity.  This is very favorable.  The plan is to continue the fulvestrant monthly and the denosumab every 6 months.  She will receive denosumab shot today.  She will see [place] again in  6 months with her next and also left shot  They know to call for any other issues that may develop before the next visit here.  Delainy Mcelhiney, Virgie Dad, MD  01/11/18 2:31 PM Medical Oncology and Hematology [facility] [address] [place], [place] [address] Tel. [phone]    Fax. [phone]  This document serves as a record of services personally performed by Lurline Del, MD. It was created on his behalf by Sheron Nightingale, a trained medical scribe. The creation of this record is based on the scribe's personal observations and the provider's statements to them.   I have reviewed the above documentation for accuracy and completeness, and I agree with the above.

## 2018-01-11 ENCOUNTER — Inpatient Hospital Stay: Payer: Medicare Other | Attending: Oncology

## 2018-01-11 ENCOUNTER — Inpatient Hospital Stay: Payer: Medicare Other

## 2018-01-11 ENCOUNTER — Inpatient Hospital Stay (HOSPITAL_BASED_OUTPATIENT_CLINIC_OR_DEPARTMENT_OTHER): Payer: Medicare Other | Admitting: Oncology

## 2018-01-11 VITALS — BP 121/76 | HR 60 | Temp 97.7°F | Resp 18 | Ht 62.0 in | Wt 119.7 lb

## 2018-01-11 DIAGNOSIS — Z170 Estrogen receptor positive status [ER+]: Secondary | ICD-10-CM | POA: Insufficient documentation

## 2018-01-11 DIAGNOSIS — G8929 Other chronic pain: Secondary | ICD-10-CM | POA: Diagnosis not present

## 2018-01-11 DIAGNOSIS — Z5111 Encounter for antineoplastic chemotherapy: Secondary | ICD-10-CM | POA: Insufficient documentation

## 2018-01-11 DIAGNOSIS — M810 Age-related osteoporosis without current pathological fracture: Secondary | ICD-10-CM | POA: Insufficient documentation

## 2018-01-11 DIAGNOSIS — C50811 Malignant neoplasm of overlapping sites of right female breast: Secondary | ICD-10-CM

## 2018-01-11 DIAGNOSIS — C50911 Malignant neoplasm of unspecified site of right female breast: Secondary | ICD-10-CM

## 2018-01-11 DIAGNOSIS — C7989 Secondary malignant neoplasm of other specified sites: Principal | ICD-10-CM

## 2018-01-11 DIAGNOSIS — C761 Malignant neoplasm of thorax: Secondary | ICD-10-CM

## 2018-01-11 DIAGNOSIS — M818 Other osteoporosis without current pathological fracture: Secondary | ICD-10-CM

## 2018-01-11 DIAGNOSIS — C50919 Malignant neoplasm of unspecified site of unspecified female breast: Secondary | ICD-10-CM

## 2018-01-11 LAB — COMPREHENSIVE METABOLIC PANEL
ALBUMIN: 3.3 g/dL — AB (ref 3.5–5.0)
ALT: 6 U/L (ref 0–55)
ANION GAP: 5 (ref 3–11)
AST: 12 U/L (ref 5–34)
Alkaline Phosphatase: 35 U/L — ABNORMAL LOW (ref 40–150)
BILIRUBIN TOTAL: 0.3 mg/dL (ref 0.2–1.2)
BUN: 9 mg/dL (ref 7–26)
CO2: 28 mmol/L (ref 22–29)
Calcium: 9 mg/dL (ref 8.4–10.4)
Chloride: 102 mmol/L (ref 98–109)
Creatinine, Ser: 0.73 mg/dL (ref 0.60–1.10)
GFR calc non Af Amer: >60 mL/min (ref >60)
GLUCOSE: 99 mg/dL (ref 70–140)
POTASSIUM: 3.9 mmol/L (ref 3.5–5.1)
SODIUM: 135 mmol/L — AB (ref 136–145)
TOTAL PROTEIN: 6.1 g/dL — AB (ref 6.4–8.3)

## 2018-01-11 LAB — CBC WITH DIFFERENTIAL/PLATELET
BASOS ABS: 0 10*3/uL (ref 0.0–0.1)
BASOS PCT: 0 %
Eosinophils Absolute: 0.2 10*3/uL (ref 0.0–0.5)
Eosinophils Relative: 3 %
HEMATOCRIT: 37.3 % (ref 34.8–46.6)
HEMOGLOBIN: 11.9 g/dL (ref 11.6–15.9)
LYMPHS PCT: 30 %
Lymphs Abs: 1.8 10*3/uL (ref 0.9–3.3)
MCH: 30.2 pg (ref 25.1–34.0)
MCHC: 31.9 g/dL (ref 31.5–36.0)
MCV: 94.7 fL (ref 79.5–101.0)
Monocytes Absolute: 0.5 10*3/uL (ref 0.1–0.9)
Monocytes Relative: 7 %
NEUTROS ABS: 3.6 10*3/uL (ref 1.5–6.5)
NEUTROS PCT: 60 %
Platelets: 267 10*3/uL (ref 145–400)
RBC: 3.94 MIL/uL (ref 3.70–5.45)
RDW: 13.7 % (ref 11.2–14.5)
WBC: 6.1 10*3/uL (ref 3.9–10.3)

## 2018-01-11 MED ORDER — DENOSUMAB 60 MG/ML ~~LOC~~ SOLN
60.0000 mg | Freq: Once | SUBCUTANEOUS | Status: AC
  Administered 2018-01-11: 60 mg via SUBCUTANEOUS

## 2018-01-11 MED ORDER — FULVESTRANT 250 MG/5ML IM SOLN
500.0000 mg | Freq: Once | INTRAMUSCULAR | Status: AC
  Administered 2018-01-11: 500 mg via INTRAMUSCULAR

## 2018-01-11 MED ORDER — DIAZEPAM 5 MG PO TABS: 5.0000 mg | ORAL_TABLET | Freq: Every evening | ORAL | 0 refills | Status: DC | PRN

## 2018-01-11 MED ORDER — DENOSUMAB 60 MG/ML ~~LOC~~ SOLN
SUBCUTANEOUS | Status: AC
  Filled 2018-01-11: qty 1

## 2018-01-11 MED ORDER — ONDANSETRON 4 MG PO TBDP
4.0000 mg | ORAL_TABLET | Freq: Three times a day (TID) | ORAL | 4 refills | Status: AC | PRN
Start: 2018-01-11 — End: ?

## 2018-01-11 NOTE — Patient Instructions (Signed)
Fulvestrant injection What is this medicine? FULVESTRANT (ful VES trant) blocks the effects of estrogen. It is used to treat breast cancer. This medicine may be used for other purposes; ask your health care provider or pharmacist if you have questions. COMMON BRAND NAME(S): FASLODEX What should I tell my health care provider before I take this medicine? They need to know if you have any of these conditions: -bleeding problems -liver disease -low levels of platelets in the blood -an unusual or allergic reaction to fulvestrant, other medicines, foods, dyes, or preservatives -pregnant or trying to get pregnant -breast-feeding How should I use this medicine? This medicine is for injection into a muscle. It is usually given by a health care professional in a hospital or clinic setting. Talk to your pediatrician regarding the use of this medicine in children. Special care may be needed. Overdosage: If you think you have taken too much of this medicine contact a poison control center or emergency room at once. NOTE: This medicine is only for you. Do not share this medicine with others. What if I miss a dose? It is important not to miss your dose. Call your doctor or health care professional if you are unable to keep an appointment. What may interact with this medicine? -medicines that treat or prevent blood clots like warfarin, enoxaparin, and dalteparin This list may not describe all possible interactions. Give your health care provider a list of all the medicines, herbs, non-prescription drugs, or dietary supplements you use. Also tell them if you smoke, drink alcohol, or use illegal drugs. Some items may interact with your medicine. What should I watch for while using this medicine? Your condition will be monitored carefully while you are receiving this medicine. You will need important blood work done while you are taking this medicine. Do not become pregnant while taking this medicine or for  at least 1 year after stopping it. Women of child-bearing potential will need to have a negative pregnancy test before starting this medicine. Women should inform their doctor if they wish to become pregnant or think they might be pregnant. There is a potential for serious side effects to an unborn child. Men should inform their doctors if they wish to father a child. This medicine may lower sperm counts. Talk to your health care professional or pharmacist for more information. Do not breast-feed an infant while taking this medicine or for 1 year after the last dose. What side effects may I notice from receiving this medicine? Side effects that you should report to your doctor or health care professional as soon as possible: -allergic reactions like skin rash, itching or hives, swelling of the face, lips, or tongue -feeling faint or lightheaded, falls -pain, tingling, numbness, or weakness in the legs -signs and symptoms of infection like fever or chills; cough; flu-like symptoms; sore throat -vaginal bleeding Side effects that usually do not require medical attention (report to your doctor or health care professional if they continue or are bothersome): -aches, pains -constipation -diarrhea -headache -hot flashes -nausea, vomiting -pain at site where injected -stomach pain This list may not describe all possible side effects. Call your doctor for medical advice about side effects. You may report side effects to FDA at 1-800-FDA-1088. Where should I keep my medicine? This drug is given in a hospital or clinic and will not be stored at home. NOTE: This sheet is a summary. It may not cover all possible information. If you have questions about this medicine, talk to your   doctor, pharmacist, or health care provider.  2018 Elsevier/Gold Standard (2015-04-11 11:03:55) Denosumab injection What is this medicine? DENOSUMAB (den oh sue mab) slows bone breakdown. Prolia is used to treat osteoporosis in  women after menopause and in men. Delton See is used to treat a high calcium level due to cancer and to prevent bone fractures and other bone problems caused by multiple myeloma or cancer bone metastases. Delton See is also used to treat giant cell tumor of the bone. This medicine may be used for other purposes; ask your health care provider or pharmacist if you have questions. COMMON BRAND NAME(S): Prolia, XGEVA What should I tell my health care provider before I take this medicine? They need to know if you have any of these conditions: -dental disease -having surgery or tooth extraction -infection -kidney disease -low levels of calcium or Vitamin D in the blood -malnutrition -on hemodialysis -skin conditions or sensitivity -thyroid or parathyroid disease -an unusual reaction to denosumab, other medicines, foods, dyes, or preservatives -pregnant or trying to get pregnant -breast-feeding How should I use this medicine? This medicine is for injection under the skin. It is given by a health care professional in a hospital or clinic setting. If you are getting Prolia, a special MedGuide will be given to you by the pharmacist with each prescription and refill. Be sure to read this information carefully each time. For Prolia, talk to your pediatrician regarding the use of this medicine in children. Special care may be needed. For Delton See, talk to your pediatrician regarding the use of this medicine in children. While this drug may be prescribed for children as young as 13 years for selected conditions, precautions do apply. Overdosage: If you think you have taken too much of this medicine contact a poison control center or emergency room at once. NOTE: This medicine is only for you. Do not share this medicine with others. What if I miss a dose? It is important not to miss your dose. Call your doctor or health care professional if you are unable to keep an appointment. What may interact with this  medicine? Do not take this medicine with any of the following medications: -other medicines containing denosumab This medicine may also interact with the following medications: -medicines that lower your chance of fighting infection -steroid medicines like prednisone or cortisone This list may not describe all possible interactions. Give your health care provider a list of all the medicines, herbs, non-prescription drugs, or dietary supplements you use. Also tell them if you smoke, drink alcohol, or use illegal drugs. Some items may interact with your medicine. What should I watch for while using this medicine? Visit your doctor or health care professional for regular checks on your progress. Your doctor or health care professional may order blood tests and other tests to see how you are doing. Call your doctor or health care professional for advice if you get a fever, chills or sore throat, or other symptoms of a cold or flu. Do not treat yourself. This drug may decrease your body's ability to fight infection. Try to avoid being around people who are sick. You should make sure you get enough calcium and vitamin D while you are taking this medicine, unless your doctor tells you not to. Discuss the foods you eat and the vitamins you take with your health care professional. See your dentist regularly. Brush and floss your teeth as directed. Before you have any dental work done, tell your dentist you are receiving this medicine. Do  not become pregnant while taking this medicine or for 5 months after stopping it. Talk with your doctor or health care professional about your birth control options while taking this medicine. Women should inform their doctor if they wish to become pregnant or think they might be pregnant. There is a potential for serious side effects to an unborn child. Talk to your health care professional or pharmacist for more information. What side effects may I notice from receiving this  medicine? Side effects that you should report to your doctor or health care professional as soon as possible: -allergic reactions like skin rash, itching or hives, swelling of the face, lips, or tongue -bone pain -breathing problems -dizziness -jaw pain, especially after dental work -redness, blistering, peeling of the skin -signs and symptoms of infection like fever or chills; cough; sore throat; pain or trouble passing urine -signs of low calcium like fast heartbeat, muscle cramps or muscle pain; pain, tingling, numbness in the hands or feet; seizures -unusual bleeding or bruising -unusually weak or tired Side effects that usually do not require medical attention (report to your doctor or health care professional if they continue or are bothersome): -constipation -diarrhea -headache -joint pain -loss of appetite -muscle pain -runny nose -tiredness -upset stomach This list may not describe all possible side effects. Call your doctor for medical advice about side effects. You may report side effects to FDA at 1-800-FDA-1088. Where should I keep my medicine? This medicine is only given in a clinic, doctor's office, or other health care setting and will not be stored at home. NOTE: This sheet is a summary. It may not cover all possible information. If you have questions about this medicine, talk to your doctor, pharmacist, or health care provider.  2018 Elsevier/Gold Standard (2016-10-05 19:17:21)  

## 2018-01-20 ENCOUNTER — Inpatient Hospital Stay (HOSPITAL_COMMUNITY)
Admission: EM | Admit: 2018-01-20 | Discharge: 2018-01-30 | DRG: 308 | Disposition: A | Discharge status: Skilled Nursing Facility | Payer: Medicare Other | Attending: Internal Medicine | Admitting: Internal Medicine

## 2018-01-20 ENCOUNTER — Other Ambulatory Visit: Payer: Self-pay

## 2018-01-20 ENCOUNTER — Inpatient Hospital Stay (HOSPITAL_COMMUNITY): Payer: Medicare Other

## 2018-01-20 ENCOUNTER — Encounter (HOSPITAL_COMMUNITY): Payer: Self-pay | Admitting: *Deleted

## 2018-01-20 DIAGNOSIS — I2510 Atherosclerotic heart disease of native coronary artery without angina pectoris: Secondary | ICD-10-CM | POA: Diagnosis present

## 2018-01-20 DIAGNOSIS — Z8249 Family history of ischemic heart disease and other diseases of the circulatory system: Secondary | ICD-10-CM | POA: Diagnosis not present

## 2018-01-20 DIAGNOSIS — M810 Age-related osteoporosis without current pathological fracture: Secondary | ICD-10-CM | POA: Diagnosis present

## 2018-01-20 DIAGNOSIS — D649 Anemia, unspecified: Secondary | ICD-10-CM | POA: Diagnosis not present

## 2018-01-20 DIAGNOSIS — F329 Major depressive disorder, single episode, unspecified: Secondary | ICD-10-CM | POA: Diagnosis not present

## 2018-01-20 DIAGNOSIS — I340 Nonrheumatic mitral (valve) insufficiency: Secondary | ICD-10-CM | POA: Diagnosis present

## 2018-01-20 DIAGNOSIS — K219 Gastro-esophageal reflux disease without esophagitis: Secondary | ICD-10-CM | POA: Diagnosis not present

## 2018-01-20 DIAGNOSIS — M4800 Spinal stenosis, site unspecified: Secondary | ICD-10-CM | POA: Diagnosis not present

## 2018-01-20 DIAGNOSIS — I480 Paroxysmal atrial fibrillation: Principal | ICD-10-CM | POA: Diagnosis present

## 2018-01-20 DIAGNOSIS — I4891 Unspecified atrial fibrillation: Secondary | ICD-10-CM

## 2018-01-20 DIAGNOSIS — Z923 Personal history of irradiation: Secondary | ICD-10-CM | POA: Diagnosis not present

## 2018-01-20 DIAGNOSIS — Z90710 Acquired absence of both cervix and uterus: Secondary | ICD-10-CM | POA: Diagnosis not present

## 2018-01-20 DIAGNOSIS — K259 Gastric ulcer, unspecified as acute or chronic, without hemorrhage or perforation: Secondary | ICD-10-CM | POA: Diagnosis not present

## 2018-01-20 DIAGNOSIS — M2550 Pain in unspecified joint: Secondary | ICD-10-CM | POA: Diagnosis not present

## 2018-01-20 DIAGNOSIS — I10 Essential (primary) hypertension: Secondary | ICD-10-CM | POA: Diagnosis not present

## 2018-01-20 DIAGNOSIS — F0390 Unspecified dementia without behavioral disturbance: Secondary | ICD-10-CM | POA: Diagnosis present

## 2018-01-20 DIAGNOSIS — K210 Gastro-esophageal reflux disease with esophagitis: Secondary | ICD-10-CM | POA: Diagnosis not present

## 2018-01-20 DIAGNOSIS — D500 Iron deficiency anemia secondary to blood loss (chronic): Secondary | ICD-10-CM | POA: Diagnosis not present

## 2018-01-20 DIAGNOSIS — K922 Gastrointestinal hemorrhage, unspecified: Secondary | ICD-10-CM | POA: Diagnosis not present

## 2018-01-20 DIAGNOSIS — R2681 Unsteadiness on feet: Secondary | ICD-10-CM | POA: Diagnosis not present

## 2018-01-20 DIAGNOSIS — I25110 Atherosclerotic heart disease of native coronary artery with unstable angina pectoris: Secondary | ICD-10-CM | POA: Diagnosis not present

## 2018-01-20 DIAGNOSIS — Z91048 Other nonmedicinal substance allergy status: Secondary | ICD-10-CM

## 2018-01-20 DIAGNOSIS — R079 Chest pain, unspecified: Secondary | ICD-10-CM

## 2018-01-20 DIAGNOSIS — Z87442 Personal history of urinary calculi: Secondary | ICD-10-CM | POA: Diagnosis not present

## 2018-01-20 DIAGNOSIS — R0602 Shortness of breath: Secondary | ICD-10-CM

## 2018-01-20 DIAGNOSIS — Z800 Family history of malignant neoplasm of digestive organs: Secondary | ICD-10-CM

## 2018-01-20 DIAGNOSIS — Z85820 Personal history of malignant melanoma of skin: Secondary | ICD-10-CM

## 2018-01-20 DIAGNOSIS — Z807 Family history of other malignant neoplasms of lymphoid, hematopoietic and related tissues: Secondary | ICD-10-CM

## 2018-01-20 DIAGNOSIS — F419 Anxiety disorder, unspecified: Secondary | ICD-10-CM | POA: Diagnosis not present

## 2018-01-20 DIAGNOSIS — Z853 Personal history of malignant neoplasm of breast: Secondary | ICD-10-CM

## 2018-01-20 DIAGNOSIS — K254 Chronic or unspecified gastric ulcer with hemorrhage: Secondary | ICD-10-CM | POA: Diagnosis present

## 2018-01-20 DIAGNOSIS — J449 Chronic obstructive pulmonary disease, unspecified: Secondary | ICD-10-CM | POA: Diagnosis not present

## 2018-01-20 DIAGNOSIS — Z888 Allergy status to other drugs, medicaments and biological substances status: Secondary | ICD-10-CM

## 2018-01-20 DIAGNOSIS — R0789 Other chest pain: Secondary | ICD-10-CM | POA: Diagnosis not present

## 2018-01-20 DIAGNOSIS — Z7982 Long term (current) use of aspirin: Secondary | ICD-10-CM | POA: Diagnosis not present

## 2018-01-20 DIAGNOSIS — D6832 Hemorrhagic disorder due to extrinsic circulating anticoagulants: Secondary | ICD-10-CM | POA: Diagnosis not present

## 2018-01-20 DIAGNOSIS — R42 Dizziness and giddiness: Secondary | ICD-10-CM | POA: Diagnosis not present

## 2018-01-20 DIAGNOSIS — M1990 Unspecified osteoarthritis, unspecified site: Secondary | ICD-10-CM | POA: Diagnosis present

## 2018-01-20 DIAGNOSIS — Z825 Family history of asthma and other chronic lower respiratory diseases: Secondary | ICD-10-CM

## 2018-01-20 DIAGNOSIS — E785 Hyperlipidemia, unspecified: Secondary | ICD-10-CM | POA: Diagnosis not present

## 2018-01-20 DIAGNOSIS — E7849 Other hyperlipidemia: Secondary | ICD-10-CM | POA: Diagnosis not present

## 2018-01-20 DIAGNOSIS — T45515A Adverse effect of anticoagulants, initial encounter: Secondary | ICD-10-CM | POA: Diagnosis not present

## 2018-01-20 DIAGNOSIS — Z7401 Bed confinement status: Secondary | ICD-10-CM | POA: Diagnosis not present

## 2018-01-20 DIAGNOSIS — Z823 Family history of stroke: Secondary | ICD-10-CM

## 2018-01-20 DIAGNOSIS — D62 Acute posthemorrhagic anemia: Secondary | ICD-10-CM | POA: Diagnosis not present

## 2018-01-20 DIAGNOSIS — Z880 Allergy status to penicillin: Secondary | ICD-10-CM

## 2018-01-20 DIAGNOSIS — R195 Other fecal abnormalities: Secondary | ICD-10-CM | POA: Diagnosis not present

## 2018-01-20 DIAGNOSIS — I2583 Coronary atherosclerosis due to lipid rich plaque: Secondary | ICD-10-CM | POA: Diagnosis not present

## 2018-01-20 DIAGNOSIS — K2901 Acute gastritis with bleeding: Secondary | ICD-10-CM | POA: Diagnosis not present

## 2018-01-20 DIAGNOSIS — M6281 Muscle weakness (generalized): Secondary | ICD-10-CM | POA: Diagnosis not present

## 2018-01-20 DIAGNOSIS — I481 Persistent atrial fibrillation: Secondary | ICD-10-CM | POA: Diagnosis not present

## 2018-01-20 DIAGNOSIS — Z9861 Coronary angioplasty status: Secondary | ICD-10-CM

## 2018-01-20 DIAGNOSIS — I959 Hypotension, unspecified: Secondary | ICD-10-CM | POA: Diagnosis not present

## 2018-01-20 DIAGNOSIS — Z9013 Acquired absence of bilateral breasts and nipples: Secondary | ICD-10-CM | POA: Diagnosis not present

## 2018-01-20 HISTORY — DX: Nonrheumatic mitral (valve) insufficiency: I34.0

## 2018-01-20 LAB — CBC WITH DIFFERENTIAL/PLATELET
BASOS ABS: 0 10*3/uL (ref 0.0–0.1)
BASOS PCT: 0 %
EOS ABS: 0.1 10*3/uL (ref 0.0–0.7)
EOS PCT: 1 %
HCT: 39.3 % (ref 36.0–46.0)
Hemoglobin: 12.6 g/dL (ref 12.0–15.0)
Lymphocytes Relative: 20 %
Lymphs Abs: 1.4 10*3/uL (ref 0.7–4.0)
MCH: 29.8 pg (ref 26.0–34.0)
MCHC: 32.1 g/dL (ref 30.0–36.0)
MCV: 92.9 fL (ref 78.0–100.0)
MONO ABS: 0.5 10*3/uL (ref 0.1–1.0)
Monocytes Relative: 6 %
Neutro Abs: 5.3 10*3/uL (ref 1.7–7.7)
Neutrophils Relative %: 73 %
PLATELETS: 271 10*3/uL (ref 150–400)
RBC: 4.23 MIL/uL (ref 3.87–5.11)
RDW: 13.4 % (ref 11.5–15.5)
WBC: 7.3 10*3/uL (ref 4.0–10.5)

## 2018-01-20 LAB — COMPREHENSIVE METABOLIC PANEL
ALBUMIN: 3.7 g/dL (ref 3.5–5.0)
ALT: 12 U/L — ABNORMAL LOW (ref 14–54)
AST: 17 U/L (ref 15–41)
Alkaline Phosphatase: 36 U/L — ABNORMAL LOW (ref 38–126)
Anion gap: 11 (ref 5–15)
BUN: 5 mg/dL — ABNORMAL LOW (ref 6–20)
CHLORIDE: 102 mmol/L (ref 101–111)
CO2: 23 mmol/L (ref 22–32)
Calcium: 9.1 mg/dL (ref 8.9–10.3)
Creatinine, Ser: 0.71 mg/dL (ref 0.44–1.00)
GFR calc Af Amer: >60 mL/min (ref >60)
GLUCOSE: 96 mg/dL (ref 65–99)
POTASSIUM: 3.9 mmol/L (ref 3.5–5.1)
SODIUM: 136 mmol/L (ref 135–145)
Total Bilirubin: 0.8 mg/dL (ref 0.3–1.2)
Total Protein: 6.4 g/dL — ABNORMAL LOW (ref 6.5–8.1)

## 2018-01-20 LAB — TROPONIN I

## 2018-01-20 LAB — URINALYSIS, ROUTINE W REFLEX MICROSCOPIC
BILIRUBIN URINE: NEGATIVE
Glucose, UA: NEGATIVE mg/dL
Hgb urine dipstick: NEGATIVE
KETONES UR: 5 mg/dL — AB
LEUKOCYTES UA: NEGATIVE
NITRITE: NEGATIVE
Protein, ur: NEGATIVE mg/dL
SPECIFIC GRAVITY, URINE: 1.003 — AB (ref 1.005–1.030)
pH: 7 (ref 5.0–8.0)

## 2018-01-20 LAB — I-STAT TROPONIN, ED: TROPONIN I, POC: 0 ng/mL (ref 0.00–0.08)

## 2018-01-20 LAB — TSH: TSH: 1.196 u[IU]/mL (ref 0.350–4.500)

## 2018-01-20 LAB — LIPASE, BLOOD: Lipase: 20 U/L (ref 11–51)

## 2018-01-20 LAB — MAGNESIUM: Magnesium: 2.1 mg/dL (ref 1.7–2.4)

## 2018-01-20 LAB — BRAIN NATRIURETIC PEPTIDE: B NATRIURETIC PEPTIDE 5: 455 pg/mL — AB (ref 0.0–100.0)

## 2018-01-20 MED ORDER — ONDANSETRON HCL 4 MG/2ML IJ SOLN
4.0000 mg | Freq: Four times a day (QID) | INTRAMUSCULAR | Status: DC | PRN
  Administered 2018-01-21 – 2018-01-25 (×5): 4 mg via INTRAVENOUS
  Filled 2018-01-20 (×5): qty 2

## 2018-01-20 MED ORDER — HEPARIN BOLUS VIA INFUSION
2700.0000 [IU] | Freq: Once | INTRAVENOUS | Status: AC
  Administered 2018-01-20: 2700 [IU] via INTRAVENOUS
  Filled 2018-01-20: qty 2700

## 2018-01-20 MED ORDER — MORPHINE SULFATE (PF) 4 MG/ML IV SOLN
4.0000 mg | Freq: Once | INTRAVENOUS | Status: AC
  Administered 2018-01-20: 4 mg via INTRAVENOUS
  Filled 2018-01-20: qty 1

## 2018-01-20 MED ORDER — MORPHINE SULFATE (PF) 4 MG/ML IV SOLN
2.0000 mg | INTRAVENOUS | Status: DC | PRN
  Administered 2018-01-21 (×4): 2 mg via INTRAVENOUS
  Filled 2018-01-20 (×4): qty 1

## 2018-01-20 MED ORDER — HYDROCODONE-ACETAMINOPHEN 5-325 MG PO TABS
1.0000 | ORAL_TABLET | Freq: Once | ORAL | Status: AC
  Administered 2018-01-20: 1 via ORAL
  Filled 2018-01-20: qty 1

## 2018-01-20 MED ORDER — DILTIAZEM HCL-DEXTROSE 100-5 MG/100ML-% IV SOLN (PREMIX)
5.0000 mg/h | INTRAVENOUS | Status: DC
  Administered 2018-01-20 – 2018-01-21 (×2): 5 mg/h via INTRAVENOUS
  Administered 2018-01-21: 10 mg/h via INTRAVENOUS
  Filled 2018-01-20 (×5): qty 100

## 2018-01-20 MED ORDER — MOMETASONE FURO-FORMOTEROL FUM 200-5 MCG/ACT IN AERO
2.0000 | INHALATION_SPRAY | Freq: Two times a day (BID) | RESPIRATORY_TRACT | Status: DC
  Administered 2018-01-21 – 2018-01-30 (×17): 2 via RESPIRATORY_TRACT
  Filled 2018-01-20 (×3): qty 8.8

## 2018-01-20 MED ORDER — ATORVASTATIN CALCIUM 20 MG PO TABS
20.0000 mg | ORAL_TABLET | Freq: Every day | ORAL | Status: DC
  Administered 2018-01-21 – 2018-01-30 (×10): 20 mg via ORAL
  Filled 2018-01-20 (×10): qty 1

## 2018-01-20 MED ORDER — GI COCKTAIL ~~LOC~~
30.0000 mL | Freq: Four times a day (QID) | ORAL | Status: DC | PRN
  Administered 2018-01-20 – 2018-01-21 (×2): 30 mL via ORAL
  Filled 2018-01-20 (×2): qty 30

## 2018-01-20 MED ORDER — SODIUM CHLORIDE 0.9 % IV BOLUS
1000.0000 mL | Freq: Once | INTRAVENOUS | Status: AC
  Administered 2018-01-20: 1000 mL via INTRAVENOUS

## 2018-01-20 MED ORDER — ONDANSETRON HCL 4 MG/2ML IJ SOLN
4.0000 mg | Freq: Once | INTRAMUSCULAR | Status: AC
  Administered 2018-01-20: 4 mg via INTRAVENOUS
  Filled 2018-01-20: qty 2

## 2018-01-20 MED ORDER — ASPIRIN EC 325 MG PO TBEC
325.0000 mg | DELAYED_RELEASE_TABLET | Freq: Once | ORAL | Status: AC
  Administered 2018-01-20: 325 mg via ORAL
  Filled 2018-01-20: qty 1

## 2018-01-20 MED ORDER — IPRATROPIUM-ALBUTEROL 0.5-2.5 (3) MG/3ML IN SOLN: 3.0000 mL | Freq: Four times a day (QID) | RESPIRATORY_TRACT | Status: DC | PRN

## 2018-01-20 MED ORDER — DILTIAZEM LOAD VIA INFUSION
10.0000 mg | Freq: Once | INTRAVENOUS | Status: AC
  Administered 2018-01-20: 10 mg via INTRAVENOUS
  Filled 2018-01-20: qty 10

## 2018-01-20 MED ORDER — HYDROMORPHONE HCL 2 MG/ML IJ SOLN
1.0000 mg | Freq: Once | INTRAMUSCULAR | Status: AC
  Administered 2018-01-20: 1 mg via INTRAVENOUS
  Filled 2018-01-20: qty 1

## 2018-01-20 MED ORDER — ACETAMINOPHEN 325 MG PO TABS
650.0000 mg | ORAL_TABLET | ORAL | Status: DC | PRN
  Administered 2018-01-20 – 2018-01-23 (×5): 650 mg via ORAL
  Filled 2018-01-20 (×5): qty 2

## 2018-01-20 MED ORDER — HEPARIN (PORCINE) IN NACL 100-0.45 UNIT/ML-% IJ SOLN
900.0000 [IU]/h | INTRAMUSCULAR | Status: DC
  Administered 2018-01-20: 800 [IU]/h via INTRAVENOUS
  Administered 2018-01-21: 900 [IU]/h via INTRAVENOUS
  Administered 2018-01-23: 950 [IU]/h via INTRAVENOUS
  Administered 2018-01-23: 1250 [IU]/h via INTRAVENOUS
  Administered 2018-01-24: 1150 [IU]/h via INTRAVENOUS
  Filled 2018-01-20 (×6): qty 250

## 2018-01-20 NOTE — ED Provider Notes (Addendum)
[name] DEPARTMENT Provider Note   CSN: [id number] Arrival date & time: 01/20/18  1344     History   Chief Complaint Chief Complaint  Patient presents with  . Chest Pain    HPI Angel [name] is a 84 y.o. female.  The history is provided by the patient and medical records. No language interpreter was used.  Chest Pain   This is a new problem. The current episode started more than 2 days ago. The problem occurs constantly. The problem has not changed since onset.The pain is associated with exertion. The pain is present in the substernal region and lateral region. The pain is at a severity of 8/10. The quality of the pain is described as heavy, pressure-like and sharp. The pain does not radiate. The symptoms are aggravated by exertion. Associated symptoms include malaise/fatigue, nausea and shortness of breath. Pertinent negatives include no abdominal pain, no back pain, no cough, no diaphoresis, no fever, no headaches and no palpitations. She has tried nothing for the symptoms. The treatment provided no relief.    Past Medical History:  Diagnosis Date  . Anxiety   . Asthma   . Breast cancer ([place]) 05/07/1999, 06/2011   a. s/p bilat mastectomies. recurrence 01/2014. Radiation, meds.  . Cancer of chest (wall) ([place])   . Cataract    eye surgery planned for 11/2014  . Chest wall recurrence of breast cancer ([place]) 2016  . Cholelithiasis   . Chronic bronchitis   . COPD (chronic obstructive pulmonary disease) ([place])   . Coronary artery disease Non-obstructive   a. 2012 Cath: LAD 40-43m->Med Rx;  b. 11/2013 Echo: EF 50-55%, nl wall motion;  c. 12/2013 Lexi MV: EF 74%, no ischemia.  . DDD (degenerative disc disease)   . Degenerative joint disease   . Depression   . Dyslipidemia   . GERD (gastroesophageal reflux disease)   . Hx of radiation therapy 07/02/1999- 08/11/1999   right supraclavucular/axillary region: 5040 cGy, 28 fractions  . Hx of radiation  therapy 02/20/14- 03/27/14   right chest wall 4500 cGy 25 sessions  . Hypertension   . Kidney stones    one stones  . Osteoarthritis   . Osteoporosis   . Pre-diabetes   . PVC's (premature ventricular contractions)   . Reflux esophagitis   . Skin cancer of face    non melanoma  . Spinal stenosis     Patient Active Problem List   Diagnosis Date Noted  . Spinal stenosis   . Skin cancer of face   . Reflux esophagitis   . PVC's (premature ventricular contractions)   . Pre-diabetes   . Osteoarthritis   . Kidney stones   . Hx of radiation therapy   . GERD (gastroesophageal reflux disease)   . Depression   . Degenerative joint disease   . Coronary artery disease   . COPD (chronic obstructive pulmonary disease) ([place])   . Cholelithiasis   . Cataract   . Cancer of chest (wall) ([place])   . Anxiety   . Malignant neoplasm of overlapping sites of right breast in female, estrogen receptor positive ([place]) 10/21/2016  . Osteoporosis 02/18/2016  . Corneal scar, left eye 12/31/2015  . Frequent falls 08/02/2015  . DOE (dyspnea on exertion) 08/02/2015  . Anxiety and depression 07/23/2015  . Anemia of chronic disease 07/23/2015  . Pain in the chest   . Nuclear sclerosis of left eye 07/17/2015  . Cystoid macular edema of right eye 11/14/2014  .  Chest pain 11/01/2014  . Dyslipidemia   . Chest wall recurrence of breast cancer ([place]) 09/27/2014  . Recurrent cancer of right breast ([place]) 05/29/2014  . Essential hypertension 05/01/2014  . Epiretinal membrane, right eye 08/22/2013  . Lamellar macular hole of right eye 08/22/2013  . Chronic pain 02/11/2011  . Asthma     Past Surgical History:  Procedure Laterality Date  . ABDOMINAL HYSTERECTOMY     in her 30's  . APPENDECTOMY    . BREAST BIOPSY Right 01/21/2014   Procedure: BREAST/CHEST WALL BIOPSY;  Surgeon: [name], MD;  Location: [facility];  Service: General;  Laterality: Right;  . BREAST LUMPECTOMY  2002   right breast  . CARDIAC  CATHETERIZATION  03/2011, 3/15  . LEFT HEART CATHETERIZATION WITH CORONARY ANGIOGRAM N/A 12/05/2013   Procedure: LEFT HEART CATHETERIZATION WITH CORONARY ANGIOGRAM;  Surgeon: [name], MD;  Location: [facility] CATH LAB;  Service: Cardiovascular;  Laterality: N/A;  . LEFT HEART CATHETERIZATION WITH CORONARY ANGIOGRAM N/A 11/06/2014   Procedure: LEFT HEART CATHETERIZATION WITH CORONARY ANGIOGRAM;  Surgeon: [name], MD;  Location: [facility] CATH LAB;  Service: Cardiovascular;  Laterality: N/A;  . MASTECTOMY  07/29/11   bilateral-rt nodes-none on left  . SMALL INTESTINE SURGERY  1998   SBO  . TONSILLECTOMY    . TUBAL LIGATION  1961  . VEIN LIGATION AND STRIPPING Right   . VESICOVAGINAL FISTULA CLOSURE W/ TAH  age 77     OB History   None      Home Medications    Prior to Admission medications   Medication Sig Start Date End Date Taking? Authorizing Provider  albuterol (PROVENTIL HFA;VENTOLIN HFA) 108 (90 Base) MCG/ACT inhaler Inhale 1-2 puffs into the lungs every 4 (four) hours as needed for wheezing or shortness of breath. 09/29/16   Pisciotta, Elmyra Ricks, PA-C  Artificial Tear Ointment (DRY EYES OP) Place 2 drops into both eyes daily as needed (dry eyes).     [provider]  aspirin EC 81 MG tablet Take 2 tablets (162 mg total) by mouth daily. 05/14/15   Magrinat, Virgie Dad, MD  atorvastatin (LIPITOR) 20 MG tablet TAKE ONE TABLET BY MOUTH ONCE DAILY Patient taking differently: TAKE 20 mg TABLET BY MOUTH ONCE DAILY 06/17/17   [name], MD  B Complex-Biotin-FA (B-COMPLEX PO) Take 1 tablet by mouth daily.     [provider]  Biotin 5000 MCG CAPS Take 1 capsule (5,000 mcg total) by mouth daily. 05/03/16   Ann Held, DO  budesonide-formoterol (SYMBICORT) 160-4.5 MCG/ACT inhaler Inhale 2 puffs into the lungs 2 (two) times daily as needed. Patient taking differently: Inhale 2 puffs into the lungs 2 (two) times daily as needed (wheezing, shortness of breath).   05/03/16   Ann Held, DO  Calcium-Magnesium-Zinc 1000-400-15 MG TABS Take 1 tablet by mouth daily.      [provider]  celecoxib (CELEBREX) 200 MG capsule TAKE 1 CAPSULE DAILY WITH  FOOD FOR PAIN AND  INFLAMMATION. Need OV 12/14/17   Ann Held, DO  Cholecalciferol (VITAMIN D-3) 5000 UNITS TABS Take 1 tablet by mouth daily.     [provider]  citalopram (CELEXA) 40 MG tablet Take 1 tablet (40 mg total) by mouth daily. Need OV 12/14/17   Roma Schanz R, DO  diazepam (VALIUM) 5 MG tablet Take 1 tablet (5 mg total) by mouth at bedtime as needed for anxiety. 01/11/18   Magrinat, Sarajane Jews  C, MD  DULoxetine (CYMBALTA) 60 MG capsule TAKE 1 CAPSULE BY MOUTH  EVERY DAY FOR MOOD. Need OV 12/14/17   Ann Held, DO  ezetimibe (ZETIA) 10 MG tablet Take 1 tablet (10 mg total) by mouth daily. 04/25/15   Ann Held, DO  Fish Oil OIL Take 1 capsule by mouth daily.     [provider]  Flaxseed, Linseed, (GROUND FLAX SEEDS PO) Take 5 mLs by mouth daily.     [provider]  isosorbide mononitrate (IMDUR) 30 MG 24 hr tablet Take 1 tablet (30 mg total) by mouth daily. Need OV 12/14/17   Carollee Herter, Yvonne R, DO  montelukast (SINGULAIR) 10 MG tablet Take 1 tablet (10 mg total) by mouth at bedtime. Need OV 12/14/17   Ann Held, DO  Multiple Vitamin (MULTIVITAMIN) tablet Take 1 tablet by mouth daily.    [provider]  nitroGLYCERIN (NITROSTAT) 0.4 MG SL tablet Place 1 tablet (0.4 mg total) under the tongue as needed. 11/23/17   [name], MD  ondansetron (ZOFRAN ODT) 4 MG disintegrating tablet Take 1 tablet (4 mg total) by mouth every 8 (eight) hours as needed for nausea or vomiting. 01/11/18   Magrinat, Virgie Dad, MD  oxyCODONE-acetaminophen (PERCOCET/ROXICET) 5-325 MG tablet Take 1 tablet by mouth daily as needed for pain. 09/12/16   [provider]    Family History Family History  Problem Relation  Age of Onset  . Pancreatic cancer Mother   . Cancer Mother        pancreatic  . Asthma Daughter   . Stroke Daughter   . Hypertension Daughter   . Heart disease Father   . Emphysema Father   . Lymphoma Brother   . Asthma Brother   . Asthma Grandchild   . Asthma Brother        deceased  . Heart disease Brother     Social History Social History   Tobacco Use  . Smoking status: Never Smoker  . Smokeless tobacco: Never Used  Substance Use Topics  . Alcohol use: Yes    Comment: occ wine  . Drug use: No     Allergies   Adhesive [tape]; Gabapentin; and Penicillins   Review of Systems Review of Systems  Constitutional: Positive for fatigue and malaise/fatigue. Negative for chills, diaphoresis and fever.  HENT: Negative for congestion.   Eyes: Negative for visual disturbance.  Respiratory: Positive for chest tightness and shortness of breath. Negative for cough, wheezing and stridor.   Cardiovascular: Positive for chest pain. Negative for palpitations.  Gastrointestinal: Positive for nausea. Negative for abdominal pain, constipation and diarrhea.  Genitourinary: Negative for dysuria.  Musculoskeletal: Negative for back pain, neck pain and neck stiffness.  Skin: Negative for rash and wound.  Neurological: Positive for light-headedness. Negative for headaches.  Psychiatric/Behavioral: Negative for agitation.  All other systems reviewed and are negative.    Physical Exam Updated Vital Signs BP 123/72 (BP Location: Left Arm)   Pulse 75   Temp 98 F (36.7 C) (Oral)   Resp 20   Ht 5\' 2"  (1.575 m)   Wt 53 kg (116 lb 12.8 oz)   SpO2 96%   BMI 21.36 kg/m   Physical Exam  Constitutional: She is oriented to person, place, and time. She appears well-developed and well-nourished. No distress.  HENT:  Head: Normocephalic and atraumatic.  Mouth/Throat: Oropharynx is clear and moist.  Eyes: Pupils are equal, round, and reactive to light.  Conjunctivae are normal.  Neck:  Normal range of motion.  Cardiovascular: Intact distal pulses. An irregularly irregular rhythm present. Tachycardia present.  No murmur heard. Pulmonary/Chest: Effort normal and breath sounds normal. No respiratory distress. She has no wheezes. She has no rales. She exhibits tenderness.    Abdominal: Soft. Bowel sounds are normal. She exhibits no distension. There is no tenderness.  Musculoskeletal: She exhibits no tenderness.  Neurological: She is alert and oriented to person, place, and time. No sensory deficit. She exhibits normal muscle tone.  Skin: Capillary refill takes less than 2 seconds. No rash noted. She is not diaphoretic. No erythema.  Psychiatric: She has a normal mood and affect.  Nursing note and vitals reviewed.    ED Treatments / Results  Labs (all labs ordered are listed, but only abnormal results are displayed) Labs Reviewed  COMPREHENSIVE METABOLIC PANEL - Abnormal; Notable for the following components:      Result Value   BUN 5 (*)    Total Protein 6.4 (*)    ALT 12 (*)    Alkaline Phosphatase 36 (*)    All other components within normal limits  URINALYSIS, ROUTINE W REFLEX MICROSCOPIC - Abnormal; Notable for the following components:   Color, Urine COLORLESS (*)    Specific Gravity, Urine 1.003 (*)    Ketones, ur 5 (*)    All other components within normal limits  BRAIN NATRIURETIC PEPTIDE - Abnormal; Notable for the following components:   B Natriuretic Peptide 455.0 (*)    All other components within normal limits  URINE CULTURE  MRSA PCR SCREENING  CBC WITH DIFFERENTIAL/PLATELET  TSH  MAGNESIUM  LIPASE, BLOOD  TROPONIN I  TROPONIN I  TROPONIN I  BASIC METABOLIC PANEL  HEPARIN LEVEL (UNFRACTIONATED)  LIPID PANEL  I-STAT TROPONIN, ED    EKG EKG Interpretation  Date/Time:  Friday January 20 2018 13:53:12 EDT Ventricular Rate:  154 PR Interval:    QRS Duration: 64 QT Interval:  284 QTC Calculation: 454 R Axis:   83 Text Interpretation:   Atrial fibrillation with rapid ventricular response with premature ventricular or aberrantly conducted complexes Septal infarct , age undetermined Abnormal ECG When comapred to prior, new a fib/flutter.  No STEMI Confirmed by Antony Blackbird [phone]) on 01/20/2018 3:34:30 PM   Radiology Dg Chest Port 1 View  Result Date: 01/20/2018 CLINICAL DATA:  Chronic chest pain EXAM: PORTABLE CHEST 1 VIEW COMPARISON:  10/26/2017, 09/29/2016, CT 09/29/2016 FINDINGS: Stable elevation of the right diaphragm. No acute opacity or pleural effusion. Stable cardiomediastinal silhouette with aortic atherosclerosis. No pneumothorax. Scoliosis of the spine. IMPRESSION: No active disease.  Stable elevation of the right diaphragm. Electronically Signed   By: Donavan Foil M.D.   On: 01/20/2018 21:08    Procedures Procedures (including critical care time)  CRITICAL CARE Performed by: Gwenyth Allegra Kolbi Tofte Total critical care time: 35 minutes Critical care time was exclusive of separately billable procedures and treating other patients. Critical care was necessary to treat or prevent imminent or life-threatening deterioration. Critical care was time spent personally by me on the following activities: development of treatment plan with patient and/or surrogate as well as nursing, discussions with consultants, evaluation of patient's response to treatment, examination of patient, obtaining history from patient or surrogate, ordering and performing treatments and interventions, ordering and review of laboratory studies, ordering and review of radiographic studies, pulse oximetry and re-evaluation of patient's condition.   CHA2Ds2-VASc Score for Atrial Fibrillation    Patient Score  Age <65 =  0 65-74 = 1 > 75 = 2 2  Sex Female = 0 Female = 1 1  CHF History No = 0  Yes = 1 0  HTN History No = 0  Yes = 1 1  Stroke/TIA/TE History No = 0  Yes = 1 0  Vascular Disease History No = 0  Yes = 1 1  Diabetes History No = 0    Yes = 1 0  Total:  5   7.2 % stroke rate/year from a score of 5   Medications Ordered in ED Medications  diltiazem (CARDIZEM) 1 mg/mL load via infusion 10 mg (10 mg Intravenous Bolus from Bag 01/20/18 1628)    And  diltiazem (CARDIZEM) 100 mg in dextrose 5% 163mL (1 mg/mL) infusion (10 mg/hr Intravenous Rate/Dose Change 01/20/18 1731)  atorvastatin (LIPITOR) tablet 20 mg (has no administration in time range)  mometasone-formoterol (DULERA) 200-5 MCG/ACT inhaler 2 puff (0 puffs Inhalation Hold 01/20/18 2119)  acetaminophen (TYLENOL) tablet 650 mg (650 mg Oral Given 01/20/18 2344)  ondansetron (ZOFRAN) injection 4 mg (has no administration in time range)  morphine 4 MG/ML injection 2 mg (2 mg Intravenous Given 01/21/18 0027)  gi cocktail (Maalox,Lidocaine,Donnatal) (30 mLs Oral Given 01/20/18 2344)  heparin ADULT infusion 100 units/mL (25000 units/235mL sodium chloride 0.45%) (800 Units/hr Intravenous New Bag/Given 01/20/18 2121)  ipratropium-albuterol (DUONEB) 0.5-2.5 (3) MG/3ML nebulizer solution 3 mL (has no administration in time range)  sodium chloride 0.9 % bolus 1,000 mL (0 mLs Intravenous Stopped 01/20/18 1657)  morphine 4 MG/ML injection 4 mg (4 mg Intravenous Given 01/20/18 1626)  HYDROmorphone (DILAUDID) injection 1 mg (1 mg Intravenous Given 01/20/18 1728)  ondansetron (ZOFRAN) injection 4 mg (4 mg Intravenous Given 01/20/18 1833)  HYDROcodone-acetaminophen (NORCO/VICODIN) 5-325 MG per tablet 1 tablet (1 tablet Oral Given 01/20/18 2014)  heparin bolus via infusion 2,700 Units (2,700 Units Intravenous Bolus from Bag 01/20/18 2121)  aspirin EC tablet 325 mg (325 mg Oral Given 01/20/18 2343)     Initial Impression / Assessment and Plan / ED Course  I have reviewed the triage vital signs and the nursing notes.  Pertinent labs & imaging results that were available during my care of the patient were reviewed by me and considered in my medical decision making (see chart for details).      Angel [name] is a 84 y.o. female with a past medical history significant for breast cancer status post bilateral mastectomies with chronic chest pain, asthma, COPD, CAD, and hyperlipidemia who presents with chest pain, shortness of breath, fatigue, nausea, and vomiting.  Patient reports that her symptoms have been worsening for the last week.  She says she is very fatigued compared to her baseline.  She does report that she ran out of her pain medication yesterday and cannot get a refill until Monday.  She says that she has not had fevers, chills, congestion, or cough.  She reports chest pain is all across her chest and is an aching pressure pain that is a 8 out of 10 in severity.  She reports that she is feeling palpitations, has had some nausea, and felt lightheaded.  She reported one episode of vomiting.  She denies any urinary symptoms, constipation, or diarrhea.  She denies any history of atrial fibrillation to her knowledge.  She has just felt general malaise.  She denies any trauma.  On exam, patient was found to have A. fib with RVR with a rate in the 150s and 160s.  Patient's lungs  were clear however her chest was diffusely tender.  Mild abdominal tenderness.  Patient had normal sensation and strength in extremities.  Symmetric pulses in upper extremities.  Patient was on room air.  Patient will be given pain medicine and have work-up to look for occult infection, elective abnormality's, or etiology that sent her into A. fib with RVR.  Patient is on aspirin Plavix and she is not on anticoagulation otherwise.  Patient will be started on diltiazem for rate control.  Given the patient's vague symptoms of worsening for the last week, suspect patient will require admission for further management.  Patient continues to have some chest pain.  Patient's rate improved with the diltiazem.  Patient was found to have elevated BNP which appears to be new.  Troponins were negative.  Patient admitted for  further management of A. fib with RVR with symptom medic chest pain.   Final Clinical Impressions(s) / ED Diagnoses   Final diagnoses:  Atrial fibrillation with RVR (HCC)  Chest pain, unspecified type    ED Discharge Orders        Ordered    Amb referral to [facility]     01/20/18 1536      Clinical Impression: 1. Atrial fibrillation with RVR (HCC)   2. Chest pain, unspecified type     Disposition: Admit  This note was prepared with assistance of Dragon voice recognition software. Occasional wrong-word or sound-a-like substitutions may have occurred due to the inherent limitations of voice recognition software.     Naftali Carchi, Gwenyth Allegra, MD 01/21/18 [id number]    Selicia Windom, Gwenyth Allegra, MD 01/31/18 [phone]

## 2018-01-20 NOTE — ED Notes (Signed)
X-ray at bedside

## 2018-01-20 NOTE — ED Notes (Signed)
Attempted to call report, RN will return call 

## 2018-01-20 NOTE — ED Notes (Signed)
Pt states that her MD gives her 30 percocet and 100 hydrocodone and valium for night. Pt states "I take the Norco every 4 hours". Pt states she "thought she had a small bottle in her purse but she was mistaken".   Son at bedside reports "the same thing happened last month where she ran out of medication".

## 2018-01-20 NOTE — H&P (Signed)
History and Physical    [name]:[id number] DOB: [birth year] DOA: 01/20/2018  Referring MD/NP/PA: Dr. Harrell Gave Tegeler PCP: Carollee Herter, Alferd Apa, DO  Patient coming from: Home  Chief Complaint: Chest pain  I have personally briefly reviewed patient's old medical records in [place]   HPI: Angel [name] is a 84 y.o. female with medical history significant of HTN, dyslipidemia, CAD, PVCs, GERD, breast cancer s/p b/l mastectomy with chemoradiation; who presents with complaints of chest pain.  She normally experiences a burning pain across her chest that makes it sometimes hard for her to breathe when she exerts herself.  This pain has been present since she was diagnosed with breast cancer and had a double mastectomy with radiation.  However, over the last 1-2 days symptoms have been constant and present even at rest which is not usual.  She even noted feeling short of breath at rest.  Patient noted associated symptoms of diaphoresis, lightheadedness, nausea, vomiting, and generalized fatigue.  Patient son thinks that the patient has been more fatigued over the last 1 to 2 weeks.  Denies having significant fever, leg swelling, change in weight, loss of consciousness, leg swelling, calf pain, recent travel, or falls.  Patient reports previously being seen by Dr. Golden Hurter in the past for PVCs.  ED Course: Upon admission into the emergency department patient was noted to be in A. fib with RVR with heart rates into the 150-160s.  She was noted to be tachypneic, but O2 saturations were maintained on room air.  Labs revealed CBC, CMP, magnesium, lipase, and troponin are relatively within normal limits.  BNP was noted to be mildly elevated at 450.  Patient was loaded with 10 mg of diltiazem and started on a drip.  TRH called to admitFor suspected new onset A. fib with RVR.      Review of Systems  Constitutional: Positive for diaphoresis and malaise/fatigue. Negative for  chills and fever.  HENT: Negative for congestion and ear discharge.   Eyes: Negative for photophobia and pain.  Respiratory: Positive for shortness of breath and wheezing. Negative for cough.   Cardiovascular: Positive for chest pain. Negative for leg swelling.  Gastrointestinal: Positive for nausea and vomiting.  Genitourinary: Negative for dysuria and urgency.  Musculoskeletal: Negative for falls and joint pain.  Skin: Negative for itching and rash.  Neurological: Positive for weakness. Negative for focal weakness and loss of consciousness.  Psychiatric/Behavioral: Negative for substance abuse. The patient is nervous/anxious.     Past Medical History:  Diagnosis Date  . Anxiety   . Asthma   . Breast cancer ([place]) 05/07/1999, 06/2011   a. s/p bilat mastectomies. recurrence 01/2014. Radiation, meds.  . Cancer of chest (wall) ([place])   . Cataract    eye surgery planned for 11/2014  . Chest wall recurrence of breast cancer ([place]) 2016  . Cholelithiasis   . Chronic bronchitis   . COPD (chronic obstructive pulmonary disease) ([place])   . Coronary artery disease Non-obstructive   a. 2012 Cath: LAD 40-80m->Med Rx;  b. 11/2013 Echo: EF 50-55%, nl wall motion;  c. 12/2013 Lexi MV: EF 74%, no ischemia.  . DDD (degenerative disc disease)   . Degenerative joint disease   . Depression   . Dyslipidemia   . GERD (gastroesophageal reflux disease)   . Hx of radiation therapy 07/02/1999- 08/11/1999   right supraclavucular/axillary region: 5040 cGy, 28 fractions  . Hx of radiation therapy 02/20/14- 03/27/14   right chest wall 4500  cGy 25 sessions  . Hypertension   . Kidney stones    one stones  . Osteoarthritis   . Osteoporosis   . Pre-diabetes   . PVC's (premature ventricular contractions)   . Reflux esophagitis   . Skin cancer of face    non melanoma  . Spinal stenosis     Past Surgical History:  Procedure Laterality Date  . ABDOMINAL HYSTERECTOMY     in her 30's  . APPENDECTOMY    . BREAST  BIOPSY Right 01/21/2014   Procedure: BREAST/CHEST WALL BIOPSY;  Surgeon: [name], MD;  Location: [facility];  Service: General;  Laterality: Right;  . BREAST LUMPECTOMY  2002   right breast  . CARDIAC CATHETERIZATION  03/2011, 3/15  . LEFT HEART CATHETERIZATION WITH CORONARY ANGIOGRAM N/A 12/05/2013   Procedure: LEFT HEART CATHETERIZATION WITH CORONARY ANGIOGRAM;  Surgeon: [name], MD;  Location: [facility] CATH LAB;  Service: Cardiovascular;  Laterality: N/A;  . LEFT HEART CATHETERIZATION WITH CORONARY ANGIOGRAM N/A 11/06/2014   Procedure: LEFT HEART CATHETERIZATION WITH CORONARY ANGIOGRAM;  Surgeon: [name], MD;  Location: [facility] CATH LAB;  Service: Cardiovascular;  Laterality: N/A;  . MASTECTOMY  07/29/11   bilateral-rt nodes-none on left  . SMALL INTESTINE SURGERY  1998   SBO  . TONSILLECTOMY    . TUBAL LIGATION  1961  . VEIN LIGATION AND STRIPPING Right   . VESICOVAGINAL FISTULA CLOSURE W/ TAH  age 68     reports that she has never smoked. She has never used smokeless tobacco. She reports that she drinks alcohol. She reports that she does not use drugs.  Allergies  Allergen Reactions  . Adhesive [Tape] Other (See Comments)    Pt prefers to use paper tape  . Gabapentin   . Penicillins Rash    Has patient had a PCN reaction causing immediate rash, facial/tongue/throat swelling, SOB or lightheadedness with hypotension: Yes Has patient had a PCN reaction causing severe rash involving mucus membranes or skin necrosis: No Has patient had a PCN reaction that required hospitalization No Has patient had a PCN reaction occurring within the last 10 years: No If all of the above answers are "NO", then may proceed with Cephalosporin use.     Family History  Problem Relation Age of Onset  . Pancreatic cancer Mother   . Cancer Mother        pancreatic  . Asthma Daughter   . Stroke Daughter   . Hypertension Daughter   . Heart disease Father   . Emphysema Father   .  Lymphoma Brother   . Asthma Brother   . Asthma Grandchild   . Asthma Brother        deceased  . Heart disease Brother     Prior to Admission medications   Medication Sig Start Date End Date Taking? Authorizing Provider  albuterol (PROVENTIL HFA;VENTOLIN HFA) 108 (90 Base) MCG/ACT inhaler Inhale 1-2 puffs into the lungs every 4 (four) hours as needed for wheezing or shortness of breath. 09/29/16   Pisciotta, Elmyra Ricks, PA-C  Artificial Tear Ointment (DRY EYES OP) Place 2 drops into both eyes daily as needed (dry eyes).     [provider]  aspirin EC 81 MG tablet Take 2 tablets (162 mg total) by mouth daily. 05/14/15   Magrinat, Virgie Dad, MD  atorvastatin (LIPITOR) 20 MG tablet TAKE ONE TABLET BY MOUTH ONCE DAILY Patient taking differently: TAKE 20 mg TABLET BY MOUTH ONCE DAILY 06/17/17   Fransico Him  R, MD  B Complex-Biotin-FA (B-COMPLEX PO) Take 1 tablet by mouth daily.     [provider]  Biotin 5000 MCG CAPS Take 1 capsule (5,000 mcg total) by mouth daily. 05/03/16   Ann Held, DO  budesonide-formoterol (SYMBICORT) 160-4.5 MCG/ACT inhaler Inhale 2 puffs into the lungs 2 (two) times daily as needed. Patient taking differently: Inhale 2 puffs into the lungs 2 (two) times daily as needed (wheezing, shortness of breath).  05/03/16   Ann Held, DO  Calcium-Magnesium-Zinc 1000-400-15 MG TABS Take 1 tablet by mouth daily.      [provider]  celecoxib (CELEBREX) 200 MG capsule TAKE 1 CAPSULE DAILY WITH  FOOD FOR PAIN AND  INFLAMMATION. Need OV 12/14/17   Ann Held, DO  Cholecalciferol (VITAMIN D-3) 5000 UNITS TABS Take 1 tablet by mouth daily.     [provider]  citalopram (CELEXA) 40 MG tablet Take 1 tablet (40 mg total) by mouth daily. Need OV 12/14/17   Roma Schanz R, DO  diazepam (VALIUM) 5 MG tablet Take 1 tablet (5 mg total) by mouth at bedtime as needed for anxiety. 01/11/18   Magrinat, Virgie Dad, MD  DULoxetine  (CYMBALTA) 60 MG capsule TAKE 1 CAPSULE BY MOUTH  EVERY DAY FOR MOOD. Need OV 12/14/17   Ann Held, DO  ezetimibe (ZETIA) 10 MG tablet Take 1 tablet (10 mg total) by mouth daily. 04/25/15   Ann Held, DO  Fish Oil OIL Take 1 capsule by mouth daily.     [provider]  Flaxseed, Linseed, (GROUND FLAX SEEDS PO) Take 5 mLs by mouth daily.     [provider]  isosorbide mononitrate (IMDUR) 30 MG 24 hr tablet Take 1 tablet (30 mg total) by mouth daily. Need OV 12/14/17   Carollee Herter, Yvonne R, DO  montelukast (SINGULAIR) 10 MG tablet Take 1 tablet (10 mg total) by mouth at bedtime. Need OV 12/14/17   Ann Held, DO  Multiple Vitamin (MULTIVITAMIN) tablet Take 1 tablet by mouth daily.    [provider]  nitroGLYCERIN (NITROSTAT) 0.4 MG SL tablet Place 1 tablet (0.4 mg total) under the tongue as needed. 11/23/17   [name], MD  ondansetron (ZOFRAN ODT) 4 MG disintegrating tablet Take 1 tablet (4 mg total) by mouth every 8 (eight) hours as needed for nausea or vomiting. 01/11/18   Magrinat, Virgie Dad, MD  oxyCODONE-acetaminophen (PERCOCET/ROXICET) 5-325 MG tablet Take 1 tablet by mouth daily as needed for pain. 09/12/16   [provider]    Physical Exam:  Constitutional: Elderly female in NAD, calm, comfortable Vitals:   01/20/18 1800 01/20/18 1830 01/20/18 1900 01/20/18 1903  BP: 118/70 116/76 119/73 119/73  Pulse: 95 88 92 68  Resp: 20 20 16 17   Temp:      TempSrc:      SpO2: 95% 94% 93% 93%  Weight:      Height:       Eyes: PERRL, lids and conjunctivae normal ENMT: Mucous membranes are moist. Posterior pharynx clear of any exudate or lesions. Neck: normal, supple, no masses, no thyromegaly Respiratory: Decreased aeration with prolonged expiration but no significant wheezes appreciated.  Patient able to talk fairly complete sentences. Cardiovascular: Irregular irregular, no murmurs / rubs / gallops. No extremity edema.  2+ pedal pulses. No carotid bruits.  Abdomen: no tenderness, no masses palpated. No hepatosplenomegaly. Bowel sounds positive.  Musculoskeletal: no clubbing /  cyanosis. No joint deformity upper and lower extremities. Good ROM, no contractures. Normal muscle tone.  Skin: no rashes, lesions, ulcers. No induration Neurologic: CN 2-12 grossly intact. Sensation intact, DTR normal. Strength 5/5 in all 4.  Psychiatric: Normal judgment and insight. Alert and oriented x 3.  Anxious mood.     Labs on Admission: I have personally reviewed following labs and imaging studies  CBC: Recent Labs  Lab 01/20/18 1445  WBC 7.3  NEUTROABS 5.3  HGB 12.6  HCT 39.3  MCV 92.9  PLT 161   Basic Metabolic Panel: Recent Labs  Lab 01/20/18 1445 01/20/18 1624  NA 136  --   K 3.9  --   CL 102  --   CO2 23  --   GLUCOSE 96  --   BUN 5*  --   CREATININE 0.71  --   CALCIUM 9.1  --   MG  --  2.1   GFR: Estimated Creatinine Clearance: 42.1 mL/min (by C-G formula based on SCr of 0.71 mg/dL). Liver Function Tests: Recent Labs  Lab 01/20/18 1445  AST 17  ALT 12*  ALKPHOS 36*  BILITOT 0.8  PROT 6.4*  ALBUMIN 3.7   Recent Labs  Lab 01/20/18 1624  LIPASE 20   No results for input(s): AMMONIA in the last 168 hours. Coagulation Profile: No results for input(s): INR, PROTIME in the last 168 hours. Cardiac Enzymes: No results for input(s): CKTOTAL, CKMB, CKMBINDEX, TROPONINI in the last 168 hours. BNP (last 3 results) No results for input(s): PROBNP in the last 8760 hours. HbA1C: No results for input(s): HGBA1C in the last 72 hours. CBG: No results for input(s): GLUCAP in the last 168 hours. Lipid Profile: No results for input(s): CHOL, HDL, LDLCALC, TRIG, CHOLHDL, LDLDIRECT in the last 72 hours. Thyroid Function Tests: Recent Labs    01/20/18 1624  TSH 1.196   Anemia Panel: No results for input(s): VITAMINB12, FOLATE, FERRITIN, TIBC, IRON, RETICCTPCT in the last 72 hours. Urine  analysis:    Component Value Date/Time   COLORURINE COLORLESS (A) 01/20/2018 1624   APPEARANCEUR CLEAR 01/20/2018 1624   LABSPEC 1.003 (L) 01/20/2018 1624   PHURINE 7.0 01/20/2018 1624   GLUCOSEU NEGATIVE 01/20/2018 1624   HGBUR NEGATIVE 01/20/2018 1624   BILIRUBINUR NEGATIVE 01/20/2018 1624   BILIRUBINUR neg 04/25/2015 1015   KETONESUR 5 (A) 01/20/2018 1624   PROTEINUR NEGATIVE 01/20/2018 1624   UROBILINOGEN 0.2 04/25/2015 1015   UROBILINOGEN 0.2 02/14/2015 2010   NITRITE NEGATIVE 01/20/2018 1624   LEUKOCYTESUR NEGATIVE 01/20/2018 1624   Sepsis Labs: No results found for this or any previous visit (from the past 240 hour(s)).   Radiological Exams on Admission: No results found.  EKG: Independently reviewed.  Atrial fibrillation in 154 bpm  Assessment/Plan Atrial fibrillation with RVR: Acute.  Patient presents with complaints of constant burning chest pain found to be in A. fib with RVR.  Electrolytes and thyroid levels noted to be within normal limits.  CHA2Ds2-VASc score = 5 note patient reports history of falls, but none recently.   - Admit to a stepdown bed - Atrial fibrillation order set initiated - Continue diltiazem drip as tolerated - Heparin drip per pharmacy - Determine best form of anticoagulation - Will need to consult cardiology and  Chest pain/unstable angina: Acute.  Symptoms could be secondary to A. fib giving history of radiation patient has increased risks of ischemia.  Initial troponin negative. - Give 324 mg of aspirin to chew x1 - Trend cardiac  troponins every 3 hours x3 overnight - Check lipid panel in a.m - Nitroglycerin prn chest pain - IV morphine prn for pain  Possible pulmonary edema/congestive heart failure exacerbation: Acute.  Patient presents with complaints of worsening shortness of breath.  BNP elevated at 455 but could be related to rapid rate. - Strict I&Os - Daily weights - Check chest x-ray - Will consider need of IV Lasix  H/O CAD  s/p PCI  COPD: Stable.  Patient has prolonged expiratory phase but no significant wheezing or rhonchi noted on exam. - continue Singulair and substitution of Dulera for Symbicort - DuoNeb prn shortness of breath/wheezing  Essential hypertension - Continue isosorbide mononitrate  Dyslipidemia - Continue Zetia and atorvastatin  Anxiety and depression - Continue Valium prn and Celexa  History of breast cancer status post radiation and double mastectomy  DVT prophylaxis: Heparin Code Status: Full Family Communication: Discussed plan of care with patient and family present at bedside Disposition Plan: Likely discharge home once medically stable Consults called:  none Admission status: Inpatient  Norval Morton MD [facility] Pager [phone]   If 7PM-7AM, please contact night-coverage [email or web address] Password [facility]  01/20/2018, 8:13 PM

## 2018-01-20 NOTE — ED Notes (Signed)
ED Provider at bedside. 

## 2018-01-20 NOTE — ED Triage Notes (Signed)
Pt in from home c/o chronic CP, per report pt takes [place] and ran out of meds yesterday, per pts PCP she cannot get another Rx until Monday, denies n/v/d, denies Sob, A&O x4

## 2018-01-20 NOTE — ED Notes (Signed)
Pt agitated in triage

## 2018-01-20 NOTE — ED Notes (Signed)
Pt given sandwich and water immediately became nauseated. Zofran to be given

## 2018-01-20 NOTE — ED Notes (Signed)
Per Dr. Gustavus Messing pt may have drink, verbal order for Norco x 1.

## 2018-01-20 NOTE — Progress Notes (Signed)
ANTICOAGULATION CONSULT NOTE - Initial Consult  Pharmacy Consult for heparin Indication: atrial fibrillation  Allergies  Allergen Reactions  . Adhesive [Tape] Other (See Comments)    Pt prefers to use paper tape  . Gabapentin   . Penicillins Rash    Has patient had a PCN reaction causing immediate rash, facial/tongue/throat swelling, SOB or lightheadedness with hypotension: Yes Has patient had a PCN reaction causing severe rash involving mucus membranes or skin necrosis: No Has patient had a PCN reaction that required hospitalization No Has patient had a PCN reaction occurring within the last 10 years: No If all of the above answers are "NO", then may proceed with Cephalosporin use.     Patient Measurements: Height: 5\' 2"  (157.5 cm) Weight: 119 lb (54 kg) IBW/kg (Calculated) : 50.1 Heparin Dosing Weight: 54 kg  Vital Signs: Temp: 99.2 F (37.3 C) (04/26 1416) Temp Source: Oral (04/26 1416) BP: 119/73 (04/26 1903) Pulse Rate: 68 (04/26 1903)  Labs: Recent Labs    01/20/18 1445  HGB 12.6  HCT 39.3  PLT 271  CREATININE 0.71    Estimated Creatinine Clearance: 42.1 mL/min (by C-G formula based on SCr of 0.71 mg/dL).   Medical History: Past Medical History:  Diagnosis Date  . Anxiety   . Asthma   . Breast cancer ([place]) 05/07/1999, 06/2011   a. s/p bilat mastectomies. recurrence 01/2014. Radiation, meds.  . Cancer of chest (wall) ([place])   . Cataract    eye surgery planned for 11/2014  . Chest wall recurrence of breast cancer ([place]) 2016  . Cholelithiasis   . Chronic bronchitis   . COPD (chronic obstructive pulmonary disease) ([place])   . Coronary artery disease Non-obstructive   a. 2012 Cath: LAD 40-64m->Med Rx;  b. 11/2013 Echo: EF 50-55%, nl wall motion;  c. 12/2013 Lexi MV: EF 74%, no ischemia.  . DDD (degenerative disc disease)   . Degenerative joint disease   . Depression   . Dyslipidemia   . GERD (gastroesophageal reflux disease)   . Hx of radiation therapy  07/02/1999- 08/11/1999   right supraclavucular/axillary region: 5040 cGy, 28 fractions  . Hx of radiation therapy 02/20/14- 03/27/14   right chest wall 4500 cGy 25 sessions  . Hypertension   . Kidney stones    one stones  . Osteoarthritis   . Osteoporosis   . Pre-diabetes   . PVC's (premature ventricular contractions)   . Reflux esophagitis   . Skin cancer of face    non melanoma  . Spinal stenosis     Medications:  Scheduled:  . atorvastatin  20 mg Oral Daily  . mometasone-formoterol  2 puff Inhalation BID    Assessment: 84 yo female presented to ED on 4/26 with chest pain. Pharmacy consulted to start heparin for new onset afib. No home anticoag.   Goal of Therapy:  Heparin level 0.3-0.7 units/ml Monitor platelets by anticoagulation protocol: Yes   Plan:  Give 2,700 units bolus x 1 Start heparin infusion at 800 units/hr Check anti-Xa level in 8 hours and daily while on heparin Continue to monitor H&H and platelets  Raechell Singleton A Bridgitte Felicetti 01/20/2018,8:32 PM

## 2018-01-20 NOTE — ED Notes (Signed)
Pt states, "this is not related to my heart." pt raises shirt up and points to bil mastectomy scars, pt aware that the Md will have to order her pain meds, pt declines a blanket, pt agitated

## 2018-01-21 ENCOUNTER — Inpatient Hospital Stay (HOSPITAL_COMMUNITY): Payer: Medicare Other

## 2018-01-21 DIAGNOSIS — I4891 Unspecified atrial fibrillation: Secondary | ICD-10-CM

## 2018-01-21 LAB — BASIC METABOLIC PANEL
Anion gap: 12 (ref 5–15)
BUN: 5 mg/dL — AB (ref 6–20)
CALCIUM: 8 mg/dL — AB (ref 8.9–10.3)
CO2: 19 mmol/L — ABNORMAL LOW (ref 22–32)
CREATININE: 0.59 mg/dL (ref 0.44–1.00)
Chloride: 105 mmol/L (ref 101–111)
GFR calc non Af Amer: >60 mL/min (ref >60)
Glucose, Bld: 105 mg/dL — ABNORMAL HIGH (ref 65–99)
Potassium: 3.8 mmol/L (ref 3.5–5.1)
SODIUM: 136 mmol/L (ref 135–145)

## 2018-01-21 LAB — LIPID PANEL
CHOLESTEROL: 165 mg/dL (ref 0–200)
HDL: 50 mg/dL (ref >40)
LDL Cholesterol: 101 mg/dL — ABNORMAL HIGH (ref 0–99)
Total CHOL/HDL Ratio: 3.3 RATIO
Triglycerides: 70 mg/dL (ref <150)
VLDL: 14 mg/dL (ref 0–40)

## 2018-01-21 LAB — ECHOCARDIOGRAM COMPLETE
Height: 62 in
Weight: 1869.5 oz

## 2018-01-21 LAB — URINE CULTURE: CULTURE: NO GROWTH

## 2018-01-21 LAB — HEPARIN LEVEL (UNFRACTIONATED)
HEPARIN UNFRACTIONATED: 0.13 [IU]/mL — AB (ref 0.30–0.70)
Heparin Unfractionated: 0.41 IU/mL (ref 0.30–0.70)

## 2018-01-21 LAB — MRSA PCR SCREENING: MRSA by PCR: NEGATIVE

## 2018-01-21 LAB — TROPONIN I: Troponin I: <0.03 ng/mL (ref <0.03)

## 2018-01-21 MED ORDER — DIAZEPAM 5 MG PO TABS
5.0000 mg | ORAL_TABLET | Freq: Every evening | ORAL | Status: DC | PRN
Start: 2018-01-21 — End: 2018-01-30
  Administered 2018-01-21 – 2018-01-29 (×8): 5 mg via ORAL
  Filled 2018-01-21 (×8): qty 1

## 2018-01-21 MED ORDER — MONTELUKAST SODIUM 10 MG PO TABS
10.0000 mg | ORAL_TABLET | Freq: Every day | ORAL | Status: DC
  Administered 2018-01-21 – 2018-01-29 (×9): 10 mg via ORAL
  Filled 2018-01-21 (×9): qty 1

## 2018-01-21 MED ORDER — SENNOSIDES-DOCUSATE SODIUM 8.6-50 MG PO TABS
1.0000 | ORAL_TABLET | Freq: Two times a day (BID) | ORAL | Status: DC
  Administered 2018-01-21 – 2018-01-30 (×14): 1 via ORAL
  Filled 2018-01-21 (×14): qty 1

## 2018-01-21 MED ORDER — OXYCODONE-ACETAMINOPHEN 5-325 MG PO TABS
1.0000 | ORAL_TABLET | ORAL | Status: DC | PRN
  Administered 2018-01-21 – 2018-01-30 (×39): 1 via ORAL
  Filled 2018-01-21 (×40): qty 1

## 2018-01-21 MED ORDER — HYPROMELLOSE (GONIOSCOPIC) 2.5 % OP SOLN
1.0000 [drp] | Freq: Three times a day (TID) | OPHTHALMIC | Status: DC | PRN
  Filled 2018-01-21: qty 15

## 2018-01-21 MED ORDER — DULOXETINE HCL 60 MG PO CPEP
60.0000 mg | ORAL_CAPSULE | Freq: Every day | ORAL | Status: DC
  Administered 2018-01-21 – 2018-01-30 (×10): 60 mg via ORAL
  Filled 2018-01-21 (×10): qty 1

## 2018-01-21 MED ORDER — DIAZEPAM 5 MG PO TABS
5.0000 mg | ORAL_TABLET | Freq: Once | ORAL | Status: AC
Start: 2018-01-21 — End: 2018-01-21
  Administered 2018-01-21: 5 mg via ORAL
  Filled 2018-01-21: qty 1

## 2018-01-21 MED ORDER — HEPARIN BOLUS VIA INFUSION
750.0000 [IU] | Freq: Once | INTRAVENOUS | Status: AC
  Administered 2018-01-21: 750 [IU] via INTRAVENOUS
  Filled 2018-01-21: qty 750

## 2018-01-21 MED ORDER — CITALOPRAM HYDROBROMIDE 20 MG PO TABS
40.0000 mg | ORAL_TABLET | Freq: Every day | ORAL | Status: DC
  Administered 2018-01-21: 40 mg via ORAL
  Filled 2018-01-21: qty 2

## 2018-01-21 MED ORDER — ISOSORBIDE MONONITRATE ER 30 MG PO TB24
30.0000 mg | ORAL_TABLET | Freq: Every day | ORAL | Status: DC
  Administered 2018-01-21 – 2018-01-22 (×2): 30 mg via ORAL
  Filled 2018-01-21 (×2): qty 1

## 2018-01-21 MED ORDER — EZETIMIBE 10 MG PO TABS
10.0000 mg | ORAL_TABLET | Freq: Every day | ORAL | Status: DC
  Administered 2018-01-21 – 2018-01-30 (×10): 10 mg via ORAL
  Filled 2018-01-21 (×10): qty 1

## 2018-01-21 MED ORDER — MORPHINE SULFATE (PF) 2 MG/ML IV SOLN
2.0000 mg | INTRAVENOUS | Status: DC | PRN
  Administered 2018-01-21 – 2018-01-23 (×3): 2 mg via INTRAVENOUS
  Filled 2018-01-21 (×3): qty 1

## 2018-01-21 NOTE — Progress Notes (Signed)
  Echocardiogram 2D Echocardiogram has been performed.  Angel [name] 01/21/2018, 11:00 AM

## 2018-01-21 NOTE — Progress Notes (Addendum)
PROGRESS NOTE  Angel [name] XNA:[id number] DOB: 1932-05-20 DOA: 01/20/2018 PCP: Ann Held, DO  HPI/Recap of past 24 hours:  Remain in afib, heart rate controlled,  She denies chest pain, no sob, no wheezing, no edema, no fever She reports chronic back pain and constipation  Assessment/Plan: Principal Problem:   Atrial fibrillation with RVR (HCC) Active Problems:   Chest pain   Dyslipidemia   Anxiety and depression   Coronary artery disease   COPD (chronic obstructive pulmonary disease) ([place])   Anxiety  New onset of afib/rvr -she presented to the hospital due to progressive DOE -she is started on cardizem drip and heparin drip on presentation -troponin negative, echo pending, tsh unremarkable -cardiology consulted, case discussed with Dr Debara Pickett over the phone.  HTN: on imdur at home  COPD: No wheezing, no cough  today, cxr no acute findings  Breast cancer s/p bilateral mastectomy, Chest wall recurrence s/p resection in 2015 -per oncology no recurrent of disease since 2015 -she is maintained on fulvestrant qmonth, last dose on 4/17, she also received denosumab q30months , last dose on 4/17   Chronic pain:  followed by Dr Nelva Bush She reports Dr Nelva Bush recently increased her pain meds to q6hrs prn,  Oncology Dr Eliseo Gum recently prescribed her valium for chronic pain as  Well There is documented confusion with gabapentin      Code Status: full  Family Communication: patient   Disposition Plan: not ready to discharge    Consultants:  cardiology  Procedures:  none  Antibiotics:  none   Objective: BP 102/61 (BP Location: Left Arm)   Pulse 77   Temp 98 F (36.7 C) (Oral)   Resp (!) 22   Ht 5\' 2"  (1.575 m)   Wt 53 kg (116 lb 13.5 oz)   SpO2 96%   BMI 21.37 kg/m   Intake/Output Summary (Last 24 hours) at 01/21/2018 1200 Last data filed at 01/21/2018 0600 Gross per 24 hour  Intake 1479.21 ml  Output 850 ml  Net 629.21 ml   Filed  Weights   01/20/18 1652 01/20/18 2221 01/21/18 0500  Weight: 54 kg (119 lb) 53 kg (116 lb 12.8 oz) 53 kg (116 lb 13.5 oz)    Exam: Patient is examined daily including today on 01/21/2018, exams remain the same as of yesterday except that has changed    General:  NAD  Cardiovascular: IRRR  Respiratory: CTABL, no wheezing  Abdomen: Soft/ND/NT, positive BS  Musculoskeletal: No Edema  Neuro: alert, oriented   Data Reviewed: Basic Metabolic Panel: Recent Labs  Lab 01/20/18 1445 01/20/18 1624 01/21/18 0730  NA 136  --  136  K 3.9  --  3.8  CL 102  --  105  CO2 23  --  19*  GLUCOSE 96  --  105*  BUN 5*  --  5*  CREATININE 0.71  --  0.59  CALCIUM 9.1  --  8.0*  MG  --  2.1  --    Liver Function Tests: Recent Labs  Lab 01/20/18 1445  AST 17  ALT 12*  ALKPHOS 36*  BILITOT 0.8  PROT 6.4*  ALBUMIN 3.7   Recent Labs  Lab 01/20/18 1624  LIPASE 20   No results for input(s): AMMONIA in the last 168 hours. CBC: Recent Labs  Lab 01/20/18 1445  WBC 7.3  NEUTROABS 5.3  HGB 12.6  HCT 39.3  MCV 92.9  PLT 271   Cardiac Enzymes:   Recent Labs  Lab  01/20/18 2113 01/21/18 0216 01/21/18 0730  TROPONINI <0.03 <0.03 <0.03   BNP (last 3 results) Recent Labs    01/20/18 1624  BNP 455.0*    ProBNP (last 3 results) No results for input(s): PROBNP in the last 8760 hours.  CBG: No results for input(s): GLUCAP in the last 168 hours.  Recent Results (from the past 240 hour(s))  MRSA PCR Screening     Status: None   Collection Time: 01/20/18 10:18 PM  Result Value Ref Range Status   MRSA by PCR NEGATIVE NEGATIVE Final    Comment:        The GeneXpert MRSA Assay (FDA approved for NASAL specimens only), is one component of a comprehensive MRSA colonization surveillance program. It is not intended to diagnose MRSA infection nor to guide or monitor treatment for MRSA infections. Performed at [facility], [place] [address] [place], [place] [address]       Studies: Dg Chest Port 1 View  Result Date: 01/20/2018 CLINICAL DATA:  Chronic chest pain EXAM: PORTABLE CHEST 1 VIEW COMPARISON:  10/26/2017, 09/29/2016, CT 09/29/2016 FINDINGS: Stable elevation of the right diaphragm. No acute opacity or pleural effusion. Stable cardiomediastinal silhouette with aortic atherosclerosis. No pneumothorax. Scoliosis of the spine. IMPRESSION: No active disease.  Stable elevation of the right diaphragm. Electronically Signed   By: Donavan Foil M.D.   On: 01/20/2018 21:08    Scheduled Meds: . atorvastatin  20 mg Oral Daily  . citalopram  40 mg Oral Daily  . ezetimibe  10 mg Oral Daily  . isosorbide mononitrate  30 mg Oral Daily  . mometasone-formoterol  2 puff Inhalation BID  . montelukast  10 mg Oral QHS  . senna-docusate  1 tablet Oral BID    Continuous Infusions: . diltiazem (CARDIZEM) infusion 10 mg/hr (01/21/18 1126)  . heparin 900 Units/hr (01/21/18 0932)     Time spent: 35 mins, I have consulted cardiology I have personally reviewed and interpreted on  01/21/2018 daily labs, tele strips, imagings as discussed above under date review session and assessment and plans.  I reviewed all nursing notes, pharmacy notes, consultant notes,  vitals, pertinent old records  I have discussed plan of care as described above with RN , patient on 01/21/2018   Florencia Reasons MD, PhD  [facility] Pager [phone]. If 7PM-7AM, please contact night-coverage at [email or web address], password [facility] 01/21/2018, 12:00 PM  LOS: 1 day

## 2018-01-21 NOTE — Progress Notes (Signed)
ANTICOAGULATION CONSULT NOTE - Follow-Up Consult  Pharmacy Consult for heparin Indication: new onset atrial fibrillation  Allergies  Allergen Reactions  . Adhesive [Tape] Other (See Comments)    Pt prefers to use paper tape  . Gabapentin   . Penicillins Rash    Has patient had a PCN reaction causing immediate rash, facial/tongue/throat swelling, SOB or lightheadedness with hypotension: Yes Has patient had a PCN reaction causing severe rash involving mucus membranes or skin necrosis: No Has patient had a PCN reaction that required hospitalization No Has patient had a PCN reaction occurring within the last 10 years: No If all of the above answers are "NO", then may proceed with Cephalosporin use.    Patient Measurements: Height: 5\' 2"  (157.5 cm) Weight: 116 lb 13.5 oz (53 kg) IBW/kg (Calculated) : 50.1 Heparin Dosing Weight: 53 kg  Vital Signs: Temp: 98.2 F (36.8 C) (04/27 0714) Temp Source: Oral (04/27 0714) BP: 106/65 (04/27 0714) Pulse Rate: 70 (04/27 0714)  Labs: Recent Labs    01/20/18 1445 01/20/18 2113 01/21/18 0216 01/21/18 0730  HGB 12.6  --   --   --   HCT 39.3  --   --   --   PLT 271  --   --   --   HEPARINUNFRC  --   --   --  0.13*  CREATININE 0.71  --   --   --   TROPONINI  --  <0.03 <0.03  --    Estimated Creatinine Clearance: 42.1 mL/min (by C-G formula based on SCr of 0.71 mg/dL).  Assessment: 84 yo female presented to ED on 4/26 with chest pain. Pharmacy consulted to start heparin for new onset afib. No home anticoag.   Heparin level this AM is subtherapeutic at 0.13  Baseline CBC wnl, no bleeding noted.  Goal of Therapy:  Heparin level 0.3-0.7 units/ml Monitor platelets by anticoagulation protocol: Yes   Plan:  Give 750 unit bolus x1 Increase heparin drip to 900 units/hr Monitor daily heparin level Monitor CBC and s/sx of bleeding Follow-up long term anticoagulation plans   Jamas Jaquay L. Kyung Rudd, PharmD, [place] PGY1 Pharmacy Resident Pager:  [phone]'

## 2018-01-21 NOTE — Progress Notes (Signed)
ANTICOAGULATION CONSULT NOTE - Follow-Up Consult  Pharmacy Consult for heparin Indication: new onset atrial fibrillation  Allergies  Allergen Reactions  . Adhesive [Tape] Other (See Comments)    Pt prefers to use paper tape  . Gabapentin Other (See Comments)    confusion  . Penicillins Rash    Has patient had a PCN reaction causing immediate rash, facial/tongue/throat swelling, SOB or lightheadedness with hypotension: Yes Has patient had a PCN reaction causing severe rash involving mucus membranes or skin necrosis: No Has patient had a PCN reaction that required hospitalization No Has patient had a PCN reaction occurring within the last 10 years: No If all of the above answers are "NO", then may proceed with Cephalosporin use.    Patient Measurements: Height: 5\' 2"  (157.5 cm) Weight: 116 lb 13.5 oz (53 kg) IBW/kg (Calculated) : 50.1 Heparin Dosing Weight: 53 kg  Vital Signs: Temp: 98.1 F (36.7 C) (04/27 1933) Temp Source: Oral (04/27 1933) BP: 97/63 (04/27 1933) Pulse Rate: 74 (04/27 1933)  Labs: Recent Labs    01/20/18 1445 01/20/18 2113 01/21/18 0216 01/21/18 0730 01/21/18 1715 01/21/18 1908  HGB 12.6  --   --   --   --   --   HCT 39.3  --   --   --   --   --   PLT 271  --   --   --   --   --   HEPARINUNFRC  --   --   --  0.13* >2.20* 0.41  CREATININE 0.71  --   --  0.59  --   --   TROPONINI  --  <0.03 <0.03 <0.03  --   --    Estimated Creatinine Clearance: 42.1 mL/min (by C-G formula based on SCr of 0.59 mg/dL).  Assessment: 84 yo female presented to ED on 4/26 with chest pain. Pharmacy consulted to start heparin for new onset afib. No anticoagulation prior to admission.   Initial leel this morning was low- she was rebolused and rate was increased. Level this evening was initially elevated - discussed with RN, and it was drawn from above the IV site (patient refused labs being drawn from opposite arm). A stat redraw was ordered by this writer- asked phlebotomy  to draw from patient's hand so it was below infusion site. That level is in range at 0.41units/mL. No bleeding noted.  Goal of Therapy:  Heparin level 0.3-0.7 units/ml Monitor platelets by anticoagulation protocol: Yes   Plan:  Continue heparin at 900 units/hr Monitor daily heparin level, CBC, and s/sx of bleeding Follow-up long term anticoagulation plans  Jidenna Figgs D. Keimani Laufer, PharmD, BCPS Clinical Pharmacist  [phone] 01/21/2018 8:05 PM

## 2018-01-22 ENCOUNTER — Encounter (HOSPITAL_COMMUNITY): Payer: Self-pay | Admitting: Physician Assistant

## 2018-01-22 DIAGNOSIS — E785 Hyperlipidemia, unspecified: Secondary | ICD-10-CM

## 2018-01-22 DIAGNOSIS — R079 Chest pain, unspecified: Secondary | ICD-10-CM

## 2018-01-22 LAB — CBC
HCT: 30.5 % — ABNORMAL LOW (ref 36.0–46.0)
HCT: 31.7 % — ABNORMAL LOW (ref 36.0–46.0)
HEMOGLOBIN: 9.8 g/dL — AB (ref 12.0–15.0)
Hemoglobin: 9.5 g/dL — ABNORMAL LOW (ref 12.0–15.0)
MCH: 29.4 pg (ref 26.0–34.0)
MCH: 29.4 pg (ref 26.0–34.0)
MCHC: 31.1 g/dL (ref 30.0–36.0)
MCHC: 30.9 g/dL (ref 30.0–36.0)
MCV: 94.4 fL (ref 78.0–100.0)
MCV: 95.2 fL (ref 78.0–100.0)
PLATELETS: 220 10*3/uL (ref 150–400)
PLATELETS: 223 10*3/uL (ref 150–400)
RBC: 3.23 MIL/uL — AB (ref 3.87–5.11)
RBC: 3.33 MIL/uL — AB (ref 3.87–5.11)
RDW: 13.6 % (ref 11.5–15.5)
RDW: 13.6 % (ref 11.5–15.5)
WBC: 5.1 10*3/uL (ref 4.0–10.5)
WBC: 5.4 10*3/uL (ref 4.0–10.5)

## 2018-01-22 LAB — BASIC METABOLIC PANEL
ANION GAP: 5 (ref 5–15)
BUN: 6 mg/dL (ref 6–20)
CALCIUM: 8.1 mg/dL — AB (ref 8.9–10.3)
CO2: 27 mmol/L (ref 22–32)
Chloride: 105 mmol/L (ref 101–111)
Creatinine, Ser: 0.64 mg/dL (ref 0.44–1.00)
GFR calc Af Amer: >60 mL/min (ref >60)
Glucose, Bld: 111 mg/dL — ABNORMAL HIGH (ref 65–99)
POTASSIUM: 3.7 mmol/L (ref 3.5–5.1)
SODIUM: 137 mmol/L (ref 135–145)

## 2018-01-22 LAB — MAGNESIUM: MAGNESIUM: 2.1 mg/dL (ref 1.7–2.4)

## 2018-01-22 LAB — HEPARIN LEVEL (UNFRACTIONATED): Heparin Unfractionated: 0.32 IU/mL (ref 0.30–0.70)

## 2018-01-22 MED ORDER — AMIODARONE HCL IN DEXTROSE 360-4.14 MG/200ML-% IV SOLN
60.0000 mg/h | INTRAVENOUS | Status: AC
  Administered 2018-01-22: 60 mg/h via INTRAVENOUS
  Filled 2018-01-22: qty 200

## 2018-01-22 MED ORDER — AMIODARONE HCL IN DEXTROSE 360-4.14 MG/200ML-% IV SOLN
30.0000 mg/h | INTRAVENOUS | Status: DC
  Administered 2018-01-22 – 2018-01-25 (×5): 30 mg/h via INTRAVENOUS
  Filled 2018-01-22 (×12): qty 200

## 2018-01-22 NOTE — Progress Notes (Signed)
ANTICOAGULATION CONSULT NOTE - Follow-Up Consult  Pharmacy Consult for heparin Indication: new onset atrial fibrillation  Allergies  Allergen Reactions  . Adhesive [Tape] Other (See Comments)    Pt prefers to use paper tape  . Gabapentin Other (See Comments)    confusion  . Penicillins Rash    Has patient had a PCN reaction causing immediate rash, facial/tongue/throat swelling, SOB or lightheadedness with hypotension: Yes Has patient had a PCN reaction causing severe rash involving mucus membranes or skin necrosis: No Has patient had a PCN reaction that required hospitalization No Has patient had a PCN reaction occurring within the last 10 years: No If all of the above answers are "NO", then may proceed with Cephalosporin use.    Patient Measurements: Height: 5\' 2"  (157.5 cm) Weight: 119 lb 14.4 oz (54.4 kg) IBW/kg (Calculated) : 50.1 Heparin Dosing Weight: 54 kg  Vital Signs: Temp: 98.4 F (36.9 C) (04/28 0539) Temp Source: Oral (04/28 0539) BP: 92/70 (04/28 0539) Pulse Rate: 102 (04/28 0539)  Labs: Recent Labs    01/20/18 1445 01/20/18 2113 01/21/18 0216  01/21/18 0730 01/21/18 1715 01/21/18 1908 01/22/18 0358  HGB 12.6  --   --   --   --   --   --  9.5*  HCT 39.3  --   --   --   --   --   --  30.5*  PLT 271  --   --   --   --   --   --  220  HEPARINUNFRC  --   --   --    < > 0.13* >2.20* 0.41 0.32  CREATININE 0.71  --   --   --  0.59  --   --  0.64  TROPONINI  --  <0.03 <0.03  --  <0.03  --   --   --    < > = values in this interval not displayed.   Estimated Creatinine Clearance: 42.1 mL/min (by C-G formula based on SCr of 0.64 mg/dL).  Assessment: 84 yo female presented to ED on 4/26 with chest pain. Pharmacy consulted to start heparin for new onset afib. No anticoagulation prior to admission.   Heparin level this AM therapeutic, 0.32 Of note, Hemoglobin this AM has dropped significantly, 12.6 > 9.5 Spoke with RN, no bleeding issues noted. Will continue  to assess.  Goal of Therapy:  Heparin level 0.3-0.7 units/ml Monitor platelets by anticoagulation protocol: Yes   Plan:  Increase heparin drip slightly to keep in range to 950 units/hr Repeat CBC at noon Monitor daily heparin level, CBC, and s/sx of bleeding Follow-up long term anticoagulation plans   Diamon Reddinger L. Kyung Rudd, PharmD, [place] PGY1 Pharmacy Resident Pager: [phone]

## 2018-01-22 NOTE — Progress Notes (Addendum)
PROGRESS NOTE  Angel [name] PJK:[id number] DOB: January 18, 1932 DOA: 01/20/2018 PCP: Ann Held, DO  Brief narrative:  84 y.o. female with a hx of non-obstructive CAD (50-70% mLAD by last cath 10/2014), mild-mod MR by echo this admission, DDD, dyslipidemia, breast CA (tx with mastectomy, radiation, meds - followed by onc), remote asthma, GERD, reflux esophagitis, PVCs, pre-DM, anemia, and depression who presented to [facility] with a fib with RVR. Cardio has seen the pt in consultation.   Assessment/Plan:   New onset of afib/rvr - CHA2DS2-VASc Score5 - Was on Cardizem drip but BP on soft side - Started amiodarone per cardio - She is on heparin drip but hgb dropped 12.6 down to 9.5 but repeat Hgb remains stable - Repeat CBC in am and if it drops would consider holding heparin   COPD - Not in acute exacerbation   Breast cancer s/p bilateral mastectomy - per oncology no recurrent of disease since 2015 - she is maintained on fulvestrant qmonth, last dose on 4/17, she also received denosumab q20months , last dose on 4/17   Memory deficits - PT eval may be of benefit to see if pt at risk of falls    Code Status: Full code  Family Communication: updated pt at bedside  Disposition Plan: home once HR controlled    Consultants:  cardiology  Procedures:  none  Antibiotics:  none  ubjective: No overnight evnets.   Objective: BP 108/81   Pulse 64   Temp 98.4 F (36.9 C) (Oral)   Resp 17   Ht 5\' 2"  (1.575 m)   Wt 54.4 kg (119 lb 14.4 oz)   SpO2 93%   BMI 21.93 kg/m   Intake/Output Summary (Last 24 hours) at 01/22/2018 1518 Last data filed at 01/22/2018 1100 Gross per 24 hour  Intake 655.73 ml  Output 1400 ml  Net -744.27 ml   Filed Weights   01/20/18 2221 01/21/18 0500 01/22/18 0539  Weight: 53 kg (116 lb 12.8 oz) 53 kg (116 lb 13.5 oz) 54.4 kg (119 lb 14.4 oz)   Physical Exam  Constitutional: Appears well-developed and well-nourished. No distress.    CVS: irregular rhythm, tachycardic, S1/S2 + Pulmonary: Effort and breath sounds normal, no stridor, rhonchi, wheezes, rales.  Abdominal: Soft. BS +,  no distension, tenderness, rebound or guarding.  Musculoskeletal: Normal range of motion. No edema and no tenderness.  Lymphadenopathy: No lymphadenopathy noted, cervical, inguinal. Neuro: Alert. Normal reflexes Skin: Skin is warm and dry. No rash noted. Not diaphoretic. No erythema. No pallor.  Psychiatric: Normal mood and affect.    Data Reviewed: Basic Metabolic Panel: Recent Labs  Lab 01/20/18 1445 01/20/18 1624 01/21/18 0730 01/22/18 0358  NA 136  --  136 137  K 3.9  --  3.8 3.7  CL 102  --  105 105  CO2 23  --  19* 27  GLUCOSE 96  --  105* 111*  BUN 5*  --  5* 6  CREATININE 0.71  --  0.59 0.64  CALCIUM 9.1  --  8.0* 8.1*  MG  --  2.1  --  2.1   Liver Function Tests: Recent Labs  Lab 01/20/18 1445  AST 17  ALT 12*  ALKPHOS 36*  BILITOT 0.8  PROT 6.4*  ALBUMIN 3.7   Recent Labs  Lab 01/20/18 1624  LIPASE 20   No results for input(s): AMMONIA in the last 168 hours. CBC: Recent Labs  Lab 01/20/18 1445 01/22/18 0358 01/22/18 1049  WBC  7.3 5.1 5.4  NEUTROABS 5.3  --   --   HGB 12.6 9.5* 9.8*  HCT 39.3 30.5* 31.7*  MCV 92.9 94.4 95.2  PLT 271 220 223   Cardiac Enzymes:   Recent Labs  Lab 01/20/18 2113 01/21/18 0216 01/21/18 0730  TROPONINI <0.03 <0.03 <0.03   BNP (last 3 results) Recent Labs    01/20/18 1624  BNP 455.0*    ProBNP (last 3 results) No results for input(s): PROBNP in the last 8760 hours.  CBG: No results for input(s): GLUCAP in the last 168 hours.  Recent Results (from the past 240 hour(s))  Urine culture     Status: None   Collection Time: 01/20/18  4:24 PM  Result Value Ref Range Status   Specimen Description URINE, RANDOM  Final   Special Requests NONE  Final   Culture   Final    NO GROWTH Performed at [facility], [address], [place], [place]  [address]    Report Status 01/21/2018 FINAL  Final  MRSA PCR Screening     Status: None   Collection Time: 01/20/18 10:18 PM  Result Value Ref Range Status   MRSA by PCR NEGATIVE NEGATIVE Final    Comment:        The GeneXpert MRSA Assay (FDA approved for NASAL specimens only), is one component of a comprehensive MRSA colonization surveillance program. It is not intended to diagnose MRSA infection nor to guide or monitor treatment for MRSA infections. Performed at [facility], [place] [address] [place], [place] [address]      Studies: No results found.  Scheduled Meds: . atorvastatin  20 mg Oral Daily  . DULoxetine  60 mg Oral Daily  . ezetimibe  10 mg Oral Daily  . mometasone-formoterol  2 puff Inhalation BID  . montelukast  10 mg Oral QHS  . senna-docusate  1 tablet Oral BID    Continuous Infusions: . amiodarone 60 mg/hr (01/22/18 1241)  . amiodarone    . heparin 950 Units/hr (01/22/18 1514)     Time spent: 25 mins,   I reviewed all nursing notes, pharmacy notes, consultant notes,  vitals, pertinent old records  I have discussed plan of care as described above with RN , patient on 01/22/2018   Leisa Lenz MD  [facility] Pager [phone]. If 7PM-7AM, please contact night-coverage at [email or web address], password [facility] 01/22/2018, 3:18 PM  LOS: 2 days

## 2018-01-22 NOTE — Consult Note (Addendum)
Cardiology Consultation:   Patient ID: Angel [name]; [id number]; 1931/12/11   Admit date: 01/20/2018 Date of Consult: 01/22/2018  Primary Care Provider: Ann Held, DO Primary Cardiologist: Dr. Radford Pax  Chief Complaint: "I felt like I needed to come to the hospital"  Patient Profile:   Angel [name] is a 84 y.o. female with a hx of non-obstructive CAD (50-70% mLAD by last cath 10/2014), mild-mod MR by echo this admission, DDD, dyslipidemia, breast CA (tx with mastectomy, radiation, meds - followed by onc), remote asthma, GERD, reflux esophagitis, PVCs, pre-DM, anemia, and depression who is being seen today for the evaluation of atrial fib at the request of Dr. Erlinda Hong.  History of Present Illness:   I met her in 2016 in setting of chest pain after no improvement with antianginal meds. She had had significant fatigue and lethargy with metoprolol and also again with Ranexa, requiring discontinuation. Cath 10/2014 showed LAD as above, felt to be unchanged from 2012, medical therapy recommended unless evidence of clear cut ischemia in the future. She was last seen in 2017. She states she should she was doing well from a heart standpoint and didn't need to follow up.  History-taking is difficult as the patient seems to have underlying memory deficits. She masks this well in initial pleasantries but it becomes quite apparent the longer you talk to her. She told me 3 separate times that she's an independent woman who raised 4 children who are doing well. She also repeated herself multiple times when asking me who I work for and if I would help her get back into see Dr. Radford Pax. She was extremely vague when I asked her why she was admitted. She made statements such as, "I was feeling things that might lead someone to want to come to the hospital" and "sometimes people have to come to the hospital when they aren't feeling right." She thinks she felt tired for 3 days but that answer changes  in conversation to 1 day. She also felt some chest discomfort and possibly palpitations at some point but isn't able to give any more details other than what is suggested to her.  2D echo showed EF 60-65%, no RWMA, mild-mod MR, mild TR, mild PR, normal PASP (LVEF improved from prior 50-55%). BNP 455, UA elevated specific gravity. Troponins neg x 4, LDL 101. Hgb on admission 4/26 was 12.6 but repeat was 9.5 this morning (prior baseline 12-13). She was loaded with 25m IV diltiazem and started on a drip. CXR NAD, stable elevation of right diaphragm. HR remains 90s-110 with intermittent spikes to 120s but the patient is currently asymptomatic. She states "I feel like I could fly" - no chest pain or SOB. Repeat CBC showed Hgb 9.8. Denies any bleeding whatsoever.   The patient's nurse today confirms intermittent disorientation. 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 I was able to speak with daughter Angel [name] does report her mother has had memory problems over the last few months. They initially attributed it to gabapentin and it did improve with discontinuation of this, but they still have continued to notice her reporting the same story over and over or becoming confused about certain details. Daughter Angel Munroalso raises concern that patient may not be taking her medication correctly, either not taking it or taking too much. EDRN note from 4/26 states "Pt states that her MD gives her 30 percocet and 100 hydrocodone and valium for night.  Pt states "I take the Norco every 4 hours". Pt states she "thought she had a small bottle in her purse but she was mistaken". Son at bedside reports "the same thing happened last month where she ran out of medication"." Angel [name] said they've had conversations about changing living arrangements but the patient wasn't quite ready to do so yet. They were unsure they would be able to afford any kind of aid and multiple family  members have work obligations.   Past Medical History:  Diagnosis Date  . Anxiety   . Asthma   . Breast cancer ([place]) 05/07/1999, 06/2011   a. s/p bilat mastectomies. recurrence 01/2014. Radiation, meds.  . Cancer of chest (wall) ([place])   . Cataract    eye surgery planned for 11/2014  . Chest wall recurrence of breast cancer ([place]) 2016  . Cholelithiasis   . Chronic bronchitis   . COPD (chronic obstructive pulmonary disease) ([place])   . Coronary artery disease Non-obstructive   a. 2012 Cath: LAD 40-55m>Med Rx;  b. 12/2013 Lexi MV: EF 74%, no ischemia. c. Cath 10/2014 - 50-70% mLAD but visually felt unchanged from prior, med rx recommended as sx were atypical.  . DDD (degenerative disc disease)   . Degenerative joint disease   . Depression   . Dyslipidemia   . GERD (gastroesophageal reflux disease)   . Hx of radiation therapy 07/02/1999- 08/11/1999   right supraclavucular/axillary region: 5040 cGy, 28 fractions  . Hx of radiation therapy 02/20/14- 03/27/14   right chest wall 4500 cGy 25 sessions  . Hypertension   . Kidney stones    one stones  . Mitral regurgitation    a. mild-mod by echo 12/2017.  . Osteoarthritis   . Osteoporosis   . Pre-diabetes   . PVC's (premature ventricular contractions)   . Reflux esophagitis   . Skin cancer of face    non melanoma  . Spinal stenosis     Past Surgical History:  Procedure Laterality Date  . ABDOMINAL HYSTERECTOMY     in her 30's  . APPENDECTOMY    . BREAST BIOPSY Right 01/21/2014   Procedure: BREAST/CHEST WALL BIOPSY;  Surgeon: ARalene Ok MD;  [place] [facility]  Service: General;  Laterality: Right;  . BREAST LUMPECTOMY  2002   right breast  . CARDIAC CATHETERIZATION  03/2011, 3/15  . LEFT HEART CATHETERIZATION WITH CORONARY ANGIOGRAM N/A 12/05/2013   Procedure: LEFT HEART CATHETERIZATION WITH CORONARY ANGIOGRAM;  Surgeon: CBurnell Blanks MD;  [facility];  Service: Cardiovascular;  Laterality: N/A;  . LEFT HEART  CATHETERIZATION WITH CORONARY ANGIOGRAM N/A 11/06/2014   Procedure: LEFT HEART CATHETERIZATION WITH CORONARY ANGIOGRAM;  Surgeon: HSinclair Grooms MD;  [facility];  Service: Cardiovascular;  Laterality: N/A;  . MASTECTOMY  07/29/11   bilateral-rt nodes-none on left  . SMALL INTESTINE SURGERY  1998   SBO  . TONSILLECTOMY    . TUBAL LIGATION  1961  . VEIN LIGATION AND STRIPPING Right   . VESICOVAGINAL FISTULA CLOSURE W/ TAH  age 84    Inpatient Medications: Scheduled Meds: . atorvastatin  20 mg Oral Daily  . DULoxetine  60 mg Oral Daily  . ezetimibe  10 mg Oral Daily  . isosorbide mononitrate  30 mg Oral Daily  . mometasone-formoterol  2 puff Inhalation BID  . montelukast  10 mg Oral QHS  . senna-docusate  1 tablet Oral BID   Continuous Infusions: . diltiazem (CARDIZEM) infusion 5 mg/hr (01/21/18 2115)  .  heparin 950 Units/hr (01/22/18 1023)   PRN Meds: acetaminophen, diazepam, gi cocktail, hydroxypropyl methylcellulose / hypromellose, ipratropium-albuterol, morphine injection, ondansetron (ZOFRAN) IV, oxyCODONE-acetaminophen  Home Meds: Prior to Admission medications   Medication Sig Start Date End Date Taking? Authorizing Provider  albuterol (PROVENTIL HFA;VENTOLIN HFA) 108 (90 Base) MCG/ACT inhaler Inhale 1-2 puffs into the lungs every 4 (four) hours as needed for wheezing or shortness of breath. 09/29/16  Yes Pisciotta, Charna Elizabeth  aspirin EC 81 MG tablet Take 2 tablets (162 mg total) by mouth daily. Patient taking differently: Take 81 mg by mouth daily.  05/14/15  Yes Magrinat, Virgie Dad, MD  B Complex-Biotin-FA (B-COMPLEX PO) Take 1 tablet by mouth daily.    Yes [provider]  Biotin 5000 MCG CAPS Take 1 capsule (5,000 mcg total) by mouth daily. 05/03/16  Yes Roma Schanz R, DO  budesonide-formoterol (SYMBICORT) 160-4.5 MCG/ACT inhaler Inhale 2 puffs into the lungs 2 (two) times daily as needed. Patient taking differently: Inhale 2 puffs into the  lungs 2 (two) times daily as needed (wheezing, shortness of breath).  05/03/16  Yes Roma Schanz R, DO  Calcium-Magnesium-Zinc 1000-400-15 MG TABS Take 1 tablet by mouth daily.     Yes [provider]  celecoxib (CELEBREX) 200 MG capsule TAKE 1 CAPSULE DAILY WITH  FOOD FOR PAIN AND  INFLAMMATION. Need OV Patient taking differently: Take 200 mg by mouth daily. TAKE  WITH  FOOD FOR PAIN AND  INFLAMMATION. Need OV 12/14/17  Yes Roma Schanz R, DO  Cholecalciferol (VITAMIN D-3) 5000 UNITS TABS Take 5,000 Units by mouth daily.    Yes [provider]  citalopram (CELEXA) 40 MG tablet Take 1 tablet (40 mg total) by mouth daily. Need OV 12/14/17  Yes Roma Schanz R, DO  diazepam (VALIUM) 5 MG tablet Take 1 tablet (5 mg total) by mouth at bedtime as needed for anxiety. Patient taking differently: Take 10 mg by mouth at bedtime.  01/11/18  Yes Magrinat, Virgie Dad, MD  DULoxetine (CYMBALTA) 60 MG capsule TAKE 1 CAPSULE BY MOUTH  EVERY DAY FOR MOOD. Need OV Patient taking differently: Take 60 mg by mouth daily. FOR MOOD. Need OV 12/14/17  Yes Roma Schanz R, DO  Fish Oil OIL Take 1 capsule by mouth daily.    Yes [provider]  Flaxseed, Linseed, (GROUND FLAX SEEDS PO) Take 5 mLs by mouth daily.    Yes [provider]  Hypromellose (ARTIFICIAL TEARS OP) Place 1 drop into both eyes daily.   Yes [provider]  isosorbide mononitrate (IMDUR) 30 MG 24 hr tablet Take 1 tablet (30 mg total) by mouth daily. Need OV 12/14/17  Yes Roma Schanz R, DO  montelukast (SINGULAIR) 10 MG tablet Take 1 tablet (10 mg total) by mouth at bedtime. Need OV 12/14/17  Yes Ann Held, DO  Multiple Vitamin (MULTIVITAMIN WITH MINERALS) TABS tablet Take 1 tablet by mouth daily.   Yes [provider]  nitroGLYCERIN (NITROSTAT) 0.4 MG SL tablet Place 1 tablet (0.4 mg total) under the tongue as needed. Patient taking differently: Place 0.4 mg under  the tongue every 5 (five) minutes as needed for chest pain.  11/23/17  Yes Turner, Eber Hong, MD  ondansetron (ZOFRAN ODT) 4 MG disintegrating tablet Take 1 tablet (4 mg total) by mouth every 8 (eight) hours as needed for nausea or vomiting. 01/11/18  Yes Magrinat, Virgie Dad, MD  VITAMIN E PO Take 1 capsule by  mouth daily.   Yes [provider]  atorvastatin (LIPITOR) 20 MG tablet TAKE ONE TABLET BY MOUTH ONCE DAILY Patient not taking: Reported on 01/21/2018 06/17/17   [name], MD  ezetimibe (ZETIA) 10 MG tablet Take 1 tablet (10 mg total) by mouth daily. Patient not taking: Reported on 01/21/2018 04/25/15   Carollee Herter, Alferd Apa, DO  HYDROcodone-acetaminophen [facility]) 10-325 MG tablet Take 2 tablets by mouth every 4 (four) hours as needed (pain).  12/26/17   [provider]  oxyCODONE-acetaminophen (PERCOCET/ROXICET) 5-325 MG tablet Take 1 tablet by mouth at bedtime.  09/12/16   [provider]    Allergies:    Allergies  Allergen Reactions  . Adhesive [Tape] Other (See Comments)    Pt prefers to use paper tape  . Gabapentin Other (See Comments)    confusion  . Penicillins Rash    Has patient had a PCN reaction causing immediate rash, facial/tongue/throat swelling, SOB or lightheadedness with hypotension: Yes Has patient had a PCN reaction causing severe rash involving mucus membranes or skin necrosis: No Has patient had a PCN reaction that required hospitalization No Has patient had a PCN reaction occurring within the last 10 years: No If all of the above answers are "NO", then may proceed with Cephalosporin use.     Social History:   Social History   Socioeconomic History  . Marital status: Single    Spouse name: Not on file  . Number of children: 4  . Years of education: Not on file  . Highest education level: Not on file  Occupational History  . Occupation: Retired    Fish farm manager: OTHER    Comment: Nursing  Social Needs  . Financial resource strain:  Not on file  . Food insecurity:    Worry: Not on file    Inability: Not on file  . Transportation needs:    Medical: Not on file    Non-medical: Not on file  Tobacco Use  . Smoking status: Never Smoker  . Smokeless tobacco: Never Used  Substance and Sexual Activity  . Alcohol use: Yes    Comment: occ wine  . Drug use: No  . Sexual activity: Not on file  Lifestyle  . Physical activity:    Days per week: Not on file    Minutes per session: Not on file  . Stress: Not on file  Relationships  . Social connections:    Talks on phone: Not on file    Gets together: Not on file    Attends religious service: Not on file    Active member of club or organization: Not on file    Attends meetings of clubs or organizations: Not on file    Relationship status: Not on file  . Intimate partner violence:    Fear of current or ex partner: Not on file    Emotionally abused: Not on file    Physically abused: Not on file    Forced sexual activity: Not on file  Other Topics Concern  . Not on file  Social History Narrative  . Not on file    Family History:   The patient's family history includes Asthma in her brother, brother, daughter, and grandchild; Cancer in her mother; Emphysema in her father; Heart disease in her brother and father; Hypertension in her daughter; Lymphoma in her brother; Pancreatic cancer in her mother; Stroke in her daughter.  ROS:  Please see the history of present illness. Denies any other neurologic sx  All other ROS reviewed and negative.     Physical Exam/Data:   Vitals:   01/21/18 2003 01/22/18 0000 01/22/18 0539 01/22/18 0917  BP:  93/61 92/70 (!) 101/56  Pulse:  77 (!) 102 64  Resp:  18 17   Temp:  98.3 F (36.8 C) 98.4 F (36.9 C)   TempSrc:  Oral Oral   SpO2: 94% 95% 96% 93%  Weight:   119 lb 14.4 oz (54.4 kg)   Height:        Intake/Output Summary (Last 24 hours) at 01/22/2018 1055 Last data filed at 01/22/2018 0947 Gross per 24 hour  Intake  712.21 ml  Output 1400 ml  Net -687.79 ml   Filed Weights   01/20/18 2221 01/21/18 0500 01/22/18 0539  Weight: 116 lb 12.8 oz (53 kg) 116 lb 13.5 oz (53 kg) 119 lb 14.4 oz (54.4 kg)   Body mass index is 21.93 kg/m.  General: Well developed thin elderly WF, in no acute distress. Well-groomed. Head: Normocephalic, atraumatic, sclera non-icteric, no xanthomas, nares are without discharge. Neck: Negative for carotid bruits. JVD not elevated. Lungs: Clear bilaterally to auscultation without wheezes, rales, or rhonchi. Breathing is unlabored. Heart: Irregularly irregular rhythm, mildly tachycardic, with S1 S2. No murmurs, rubs, or gallops appreciated. Abdomen: Soft, non-tender, non-distended with normoactive bowel sounds. No hepatomegaly. No rebound/guarding. No obvious abdominal masses. Msk:  Strength and tone appear normal for age. Extremities: No clubbing or cyanosis. No edema. Distal pedal pulses are 2+ and equal bilaterally. Neuro: Alert and oriented to self, April 2019, place but confabulates and repeats herself as above.  Moves all extremities spontaneously. Psych:  Responds to questions appropriately with a normal affect.  EKG:  The EKG was personally reviewed and demonstrates atrial fib 154bpm, occ PVCs, nonspecific ST-T changes with downsloped TWI inferiorly and subtle ST sagging V4-V6 but with baseline wander  Relevant CV Studies: As above  Laboratory Data:  Chemistry Recent Labs  Lab 01/20/18 1445 01/21/18 0730 01/22/18 0358  NA 136 136 137  K 3.9 3.8 3.7  CL 102 105 105  CO2 23 19* 27  GLUCOSE 96 105* 111*  BUN 5* 5* 6  CREATININE 0.71 0.59 0.64  CALCIUM 9.1 8.0* 8.1*  GFRNONAA >60 >60 >60  GFRAA >60 >60 >60  ANIONGAP '11 12 5    ' Recent Labs  Lab 01/20/18 1445  PROT 6.4*  ALBUMIN 3.7  AST 17  ALT 12*  ALKPHOS 36*  BILITOT 0.8   Hematology Recent Labs  Lab 01/20/18 1445 01/22/18 0358  WBC 7.3 5.1  RBC 4.23 3.23*  HGB 12.6 9.5*  HCT 39.3 30.5*  MCV  92.9 94.4  MCH 29.8 29.4  MCHC 32.1 31.1  RDW 13.4 13.6  PLT 271 220   Cardiac Enzymes Recent Labs  Lab 01/20/18 2113 01/21/18 0216 01/21/18 0730  TROPONINI <0.03 <0.03 <0.03    Recent Labs  Lab 01/20/18 1454  TROPIPOC 0.00    BNP Recent Labs  Lab 01/20/18 1624  BNP 455.0*    DDimer No results for input(s): DDIMER in the last 168 hours.  Radiology/Studies:  Dg Chest Port 1 View  Result Date: 01/20/2018 CLINICAL DATA:  Chronic chest pain EXAM: PORTABLE CHEST 1 VIEW COMPARISON:  10/26/2017, 09/29/2016, CT 09/29/2016 FINDINGS: Stable elevation of the right diaphragm. No acute opacity or pleural effusion. Stable cardiomediastinal silhouette with aortic atherosclerosis. No pneumothorax. Scoliosis of the spine. IMPRESSION: No active disease.  Stable elevation of the right diaphragm. Electronically Signed   By:  Donavan Foil M.D.   On: 01/20/2018 21:08    Assessment and Plan:   1. Newly recognized atrial fibrillation - CHADSVASC 5. She is now asymptomatic with initiation of diltiazem, but blood pressure remains soft. D/w Dr. Radford Pax -> best option given hypotension is to treat with amiodarone. Will not bolus. There is some chance of conversion to NSR but with ongoing hypotension she feels this is the most appropriate therapy as options are limited. (Digoxin carries higher risk of mortality in AF patients with normal EF.) We will not yet convert her heparin to oral anticoagulation as it is somewhat unclear if she will be a candidate for blood thinners given her unclear living situation and anemia. I feel she likely has dementia and it would be dangerous to send her home on DOAC without someone helping to administer her medications. As such, will hold off on plans for TEE/DCCV until further plans are made. Per d/w Dr. Radford Pax, will defer decision for continuation of heparin to IM pending investigation of drop in Hgb.  2. Hypotension - BP is historically on lower side, follow.  3. Newly  recognized anemia - spoke with IM, recommended further eval. Order for hemoccult placed. Nurse made aware regarding future BMs as well. No obvious signs of bleeding. With specific gravity elevated on UA on admission, question whether her initial Hgb was elevated more than baseline due to hemoconcentration - although her baseline does seem to run 12-13.  4. Memory deficits - also spoke with IM regarding this. Likely needs formal evaluation. No focal deficits on exam but neurologic imaging can be considered given the atrial fibrillation.  5. CAD - no acute symptoms to suggest angina, and troponin is negative. Continue conservative management. Not on BB due to h/o fatigue.  6. Hyperlipidemia - LDL above goal for CAD but patient's daughter expresses concern over patient not taking meds correctly. Statins have been implicated in some cognitive deficit so a holiday could be considered if IM feels is appropriate.   For questions or updates, please contact [place] Please consult [email or web address] for contact info under Cardiology/STEMI.    Signed, Charlie Pitter, PA-C  01/22/2018 10:55 AM

## 2018-01-22 NOTE — Progress Notes (Signed)
The patient is having some confusion about the time and how long she has been here. While reorienting the patient become upset and argumentative. I will continue to monitor the patient closely.    Saddie Benders RN

## 2018-01-23 DIAGNOSIS — I4891 Unspecified atrial fibrillation: Secondary | ICD-10-CM

## 2018-01-23 DIAGNOSIS — R0789 Other chest pain: Secondary | ICD-10-CM

## 2018-01-23 DIAGNOSIS — D649 Anemia, unspecified: Secondary | ICD-10-CM

## 2018-01-23 LAB — CBC
HCT: 28.5 % — ABNORMAL LOW (ref 36.0–46.0)
HEMOGLOBIN: 9 g/dL — AB (ref 12.0–15.0)
MCH: 29.8 pg (ref 26.0–34.0)
MCHC: 31.6 g/dL (ref 30.0–36.0)
MCV: 94.4 fL (ref 78.0–100.0)
Platelets: 207 10*3/uL (ref 150–400)
RBC: 3.02 MIL/uL — AB (ref 3.87–5.11)
RDW: 13.6 % (ref 11.5–15.5)
WBC: 4.7 10*3/uL (ref 4.0–10.5)

## 2018-01-23 LAB — HEPARIN LEVEL (UNFRACTIONATED)
HEPARIN UNFRACTIONATED: 0.11 [IU]/mL — AB (ref 0.30–0.70)
Heparin Unfractionated: 0.21 IU/mL — ABNORMAL LOW (ref 0.30–0.70)

## 2018-01-23 MED ORDER — METOPROLOL TARTRATE 12.5 MG HALF TABLET
12.5000 mg | ORAL_TABLET | Freq: Two times a day (BID) | ORAL | Status: DC
  Administered 2018-01-23 – 2018-01-30 (×14): 12.5 mg via ORAL
  Filled 2018-01-23 (×15): qty 1

## 2018-01-23 MED ORDER — ADULT MULTIVITAMIN W/MINERALS CH
1.0000 | ORAL_TABLET | Freq: Every day | ORAL | Status: DC
  Administered 2018-01-23 – 2018-01-30 (×8): 1 via ORAL
  Filled 2018-01-23 (×8): qty 1

## 2018-01-23 MED ORDER — POLYETHYLENE GLYCOL 3350 17 G PO PACK
17.0000 g | PACK | Freq: Two times a day (BID) | ORAL | Status: DC
  Administered 2018-01-23 – 2018-01-30 (×7): 17 g via ORAL
  Filled 2018-01-23 (×7): qty 1

## 2018-01-23 MED ORDER — BISACODYL 5 MG PO TBEC
5.0000 mg | DELAYED_RELEASE_TABLET | Freq: Every day | ORAL | Status: DC | PRN
  Administered 2018-01-23: 5 mg via ORAL
  Filled 2018-01-23: qty 1

## 2018-01-23 MED ORDER — POLYVINYL ALCOHOL 1.4 % OP SOLN: 1.0000 [drp] | OPHTHALMIC | Status: DC | PRN

## 2018-01-23 MED ORDER — ACETAMINOPHEN 325 MG PO TABS
650.0000 mg | ORAL_TABLET | ORAL | Status: DC | PRN
  Administered 2018-01-24 – 2018-01-30 (×7): 650 mg via ORAL
  Filled 2018-01-23 (×7): qty 2

## 2018-01-23 MED ORDER — SORBITOL 70 % SOLN
960.0000 mL | TOPICAL_OIL | Freq: Once | ORAL | Status: AC
  Administered 2018-01-23: 960 mL via RECTAL
  Filled 2018-01-23: qty 473

## 2018-01-23 MED ORDER — ALPRAZOLAM 0.25 MG PO TABS
0.2500 mg | ORAL_TABLET | Freq: Once | ORAL | Status: AC
  Administered 2018-01-23: 0.25 mg via ORAL
  Filled 2018-01-23: qty 1

## 2018-01-23 NOTE — Progress Notes (Signed)
Progress Note  Patient Name: Angel [name] Date of Encounter: 01/23/2018  Primary Cardiologist: No primary care provider on file.   Subjective   She had a rough night with pain all over.   Inpatient Medications    Scheduled Meds: . atorvastatin  20 mg Oral Daily  . DULoxetine  60 mg Oral Daily  . ezetimibe  10 mg Oral Daily  . mometasone-formoterol  2 puff Inhalation BID  . montelukast  10 mg Oral QHS  . senna-docusate  1 tablet Oral BID   Continuous Infusions: . amiodarone 30 mg/hr (01/23/18 0639)  . heparin 950 Units/hr (01/23/18 0131)   PRN Meds: acetaminophen, bisacodyl, diazepam, gi cocktail, hydroxypropyl methylcellulose / hypromellose, ipratropium-albuterol, morphine injection, ondansetron (ZOFRAN) IV, oxyCODONE-acetaminophen   Vital Signs    Vitals:   01/23/18 0440 01/23/18 0757 01/23/18 0827 01/23/18 0900  BP: 117/74 107/70    Pulse: (!) 48 (!) 54    Resp: 19     Temp: 98.3 F (36.8 C) 97.8 F (36.6 C)    TempSrc: Oral Oral    SpO2: 90% 96% 98%   Weight:    120 lb 14.4 oz (54.8 kg)  Height:        Intake/Output Summary (Last 24 hours) at 01/23/2018 1009 Last data filed at 01/23/2018 0917 Gross per 24 hour  Intake 754.74 ml  Output 1200 ml  Net -445.26 ml   Filed Weights   01/21/18 0500 01/22/18 0539 01/23/18 0900  Weight: 116 lb 13.5 oz (53 kg) 119 lb 14.4 oz (54.4 kg) 120 lb 14.4 oz (54.8 kg)   Telemetry    A-fib with RVR - Personally Reviewed  ECG    A-fib - Personally Reviewed  Physical Exam   GEN: No acute distress.   Neck: No JVD Cardiac: iRRR, no murmurs, rubs, or gallops.  Respiratory: Clear to auscultation bilaterally. GI: Soft, nontender, non-distended  MS: No edema; No deformity. Neuro:  Nonfocal  Psych: Normal affect   Labs    Chemistry Recent Labs  Lab 01/20/18 1445 01/21/18 0730 01/22/18 0358  NA 136 136 137  K 3.9 3.8 3.7  CL 102 105 105  CO2 23 19* 27  GLUCOSE 96 105* 111*  BUN 5* 5* 6  CREATININE  0.71 0.59 0.64  CALCIUM 9.1 8.0* 8.1*  PROT 6.4*  --   --   ALBUMIN 3.7  --   --   AST 17  --   --   ALT 12*  --   --   ALKPHOS 36*  --   --   BILITOT 0.8  --   --   GFRNONAA >60 >60 >60  GFRAA >60 >60 >60  ANIONGAP 11 12 5      Hematology Recent Labs  Lab 01/22/18 0358 01/22/18 1049 01/23/18 0520  WBC 5.1 5.4 4.7  RBC 3.23* 3.33* 3.02*  HGB 9.5* 9.8* 9.0*  HCT 30.5* 31.7* 28.5*  MCV 94.4 95.2 94.4  MCH 29.4 29.4 29.8  MCHC 31.1 30.9 31.6  RDW 13.6 13.6 13.6  PLT 220 223 207    Cardiac Enzymes Recent Labs  Lab 01/20/18 2113 01/21/18 0216 01/21/18 0730  TROPONINI <0.03 <0.03 <0.03    Recent Labs  Lab 01/20/18 1454  TROPIPOC 0.00     BNP Recent Labs  Lab 01/20/18 1624  BNP 455.0*     DDimer No results for input(s): DDIMER in the last 168 hours.   Radiology    No results found.  Cardiac Studies  TTE: 01/21/2018 - Left ventricle: The cavity size was normal. Systolic function was   normal. The estimated ejection fraction was in the range of 60%   to 65%. Wall motion was normal; there were no regional wall   motion abnormalities. The study was not technically sufficient to   allow evaluation of LV diastolic dysfunction due to atrial   fibrillation. - Aortic valve: Trileaflet; mildly thickened, mildly calcified   leaflets. Sclerosis without stenosis. - Mitral valve: There was mild to moderate regurgitation. - Left atrium: The atrium was normal in size. - Right ventricle: Systolic function was normal. - Tricuspid valve: There was mild regurgitation. - Pulmonic valve: There was mild regurgitation. - Pulmonary arteries: Systolic pressure was within the normal   range. - Inferior vena cava: The vessel was normal in size. - Pericardium, extracardiac: There was no pericardial effusion.  Impressions:  - When compared to the prior study from 08/20/2015 LVEF has   improved from 50% to 60-65%.  Patient Profile     84 y.o. female  with a hx of  non-obstructive CAD (50-70% mLAD by last cath 10/2014), mild-mod MR by echo this admission, DDD, dyslipidemia, breast CA (tx with mastectomy, radiation, meds - followed by onc), remote asthma, GERD, reflux esophagitis, PVCs, pre-DM, anemia, and depression who is being seen today for the evaluation of atrial fib at the request of Dr. Erlinda Hong.  Assessment & Plan    1. Newly recognized atrial fibrillation - CHADSVASC 5. She is now asymptomatic with initiation of diltiazem, but blood pressure remains soft. Started on amiodarone iv yesterday, still in a-fib with RVR. - I will add metoprolol 12.5 mg po BID - keep heparin till cardioversion, then convert to Eliquis, follow Hb closely, today 9.0  - schedule for TEE /DCCV on We unless she cardioverts on her own.   2. Hypotension - BP is historically on lower side, follow.  3. Newly recognized anemia - spoke with IM, recommended further eval. Order for hemoccult placed. Nurse made aware regarding future BMs as well. No obvious signs of bleeding. With specific gravity elevated on UA on admission, question whether her initial Hgb was elevated more than baseline due to hemoconcentration - although her baseline does seem to run 12-13.  4. Memory deficits - also spoke with IM regarding this. Likely needs formal evaluation. No focal deficits on exam but neurologic imaging can be considered given the atrial fibrillation.  5. CAD - no acute symptoms to suggest angina, and troponin is negative. Continue conservative management. Not on BB due to h/o fatigue.  6. Hyperlipidemia - LDL above goal for CAD but patient's daughter expresses concern over patient not taking meds correctly. Statins have been implicated in some cognitive deficit so a holiday could be considered if IM feels is appropriate.  For questions or updates, please contact [place] Please consult [email or web address] for contact info under Cardiology/STEMI.      Signed, Ena Dawley, MD    01/23/2018, 10:09 AM

## 2018-01-23 NOTE — Progress Notes (Signed)
[place] for heparin Indication: new onset atrial fibrillation  Patient Measurements: Height: 5\' 2"  (157.5 cm) Weight: 119 lb 14.4 oz (54.4 kg) IBW/kg (Calculated) : 50.1 Heparin Dosing Weight: 54 kg  Vital Signs: Temp: 97.8 F (36.6 C) (04/29 0757) Temp Source: Oral (04/29 0757) BP: 107/70 (04/29 0757) Pulse Rate: 54 (04/29 0757)  Labs: Recent Labs    01/20/18 1445 01/20/18 2113 01/21/18 0216 01/21/18 0730  01/21/18 1908 01/22/18 0358 01/22/18 1049 01/23/18 0520  HGB 12.6  --   --   --   --   --  9.5* 9.8* 9.0*  HCT 39.3  --   --   --   --   --  30.5* 31.7* 28.5*  PLT 271  --   --   --   --   --  220 223 207  HEPARINUNFRC  --   --   --  0.13*   < > 0.41 0.32  --  0.21*  CREATININE 0.71  --   --  0.59  --   --  0.64  --   --   TROPONINI  --  <0.03 <0.03 <0.03  --   --   --   --   --    < > = values in this interval not displayed.    Assessment: 84 yo female presented to ED on 4/26 with chest pain. Heparin for AFib. No anticoagulation prior to admission.  Heparin level slightly subtherapeutic this morning - discussed with RN. Hgb slightly low from admit, 12.6 >9, obtaining FOBT.  Goal of Therapy:  Heparin level 0.3-0.7 units/ml Monitor platelets by anticoagulation protocol: Yes   Plan:  Increase heparin infusion to 1100 units/hr Check cHL Daily HL, CBC Follow-up long term anticoagulation plans    Hughes Better, PharmD, BCPS Clinical Pharmacist 01/23/2018 9:04 AM

## 2018-01-23 NOTE — Evaluation (Signed)
Physical Therapy Evaluation Patient Details Name: Angel [name] MRN: [id number] DOB: 1932-02-22 Today's Date: 01/23/2018   History of Present Illness  84 y.o. female with a hx of HTN, dyslipidemia, CAD, PVCs, GERD, and breast cancer s/p B mastectomy with chemoradiation who presented with complaints of chest pain.  She normally experiences a burning pain across her chest that makes it sometimes hard for her to breathe when she exerts herself.  This pain has been present since she was diagnosed with breast cancer and had a double mastectomy with radiation.  However, over the 1-2 days prior to this admit her symptoms had been constant and present even at rest.  In the ED she was noted to be in A. fib with RVR with heart ratesinto the 150-160s. She was noted to be tachypneic,but O2 saturations were maintained on room air.Pt very upset that she has not had BM since admit.    Clinical Impression  Pt admitted with above diagnosis. Pt currently with functional limitations due to the deficits listed below (see PT Problem List).Pt was able to ambulate in hallway with min assist without device.  Pt did have a LOB or two but self corrected.  Pt is weak and may need a RW on d/c.  Will follow acutely.  Pt very upset that she is constipated and venting about it during session.  Nurse and MD aware and are addressing.    Pt will benefit from skilled PT to increase their independence and safety with mobility to allow discharge to the venue listed below.      Follow Up Recommendations Home health PT;Supervision/Assistance - 24 hour    Equipment Recommendations  Other (comment)(TBA)    Recommendations for Other Services       Precautions / Restrictions Precautions Precautions: Fall Restrictions Weight Bearing Restrictions: No      Mobility  Bed Mobility Overal bed mobility: Independent                Transfers Overall transfer level: Independent                   Ambulation/Gait Ambulation/Gait assistance: Min guard Ambulation Distance (Feet): 250 Feet Assistive device: None Gait Pattern/deviations: Step-through pattern;Decreased stride length;Drifts right/left   Gait velocity interpretation: 1.31 - 2.62 ft/sec, indicative of limited community ambulator General Gait Details: Pt with good safety overall although she did drift side to side at times but did self correct without LOB.  If given challenges, could lose balance.  Discussed that pt may want to use a device and pt not opposed.   Stairs            Wheelchair Mobility    Modified Rankin (Stroke Patients Only)       Balance Overall balance assessment: Needs assistance Sitting-balance support: No upper extremity supported;Feet supported Sitting balance-Leahy Scale: Good     Standing balance support: No upper extremity supported;During functional activity Standing balance-Leahy Scale: Fair Standing balance comment: able to stand statically without UE support                             Pertinent Vitals/Pain Pain Assessment: No/denies pain  VSS  Home Living Family/patient expects to be discharged to:: Private residence Living Arrangements: Alone Available Help at Discharge: Family;Available 24 hours/day Type of Home: Apartment Home Access: Stairs to enter Entrance Stairs-Rails: Left Entrance Stairs-Number of Steps: 5 Home Layout: One level Home Equipment: Cane - single point;Tub bench Additional  Comments: Doristine Bosworth present and states that she can borrow any equipment she needs from the church.    Prior Function Level of Independence: Independent         Comments: Pt states she can stay with her son if she needs to      Hand Dominance   Dominant Hand: Right    Extremity/Trunk Assessment   Upper Extremity Assessment Upper Extremity Assessment: Defer to OT evaluation    Lower Extremity Assessment Lower Extremity Assessment: Overall WFL for tasks  assessed    Cervical / Trunk Assessment Cervical / Trunk Assessment: Normal  Communication   Communication: No difficulties  Cognition Arousal/Alertness: Awake/alert Behavior During Therapy: WFL for tasks assessed/performed Overall Cognitive Status: Within Functional Limits for tasks assessed                                        General Comments      Exercises     Assessment/Plan    PT Assessment Patient needs continued PT services  PT Problem List Decreased activity tolerance;Decreased balance;Decreased mobility;Decreased knowledge of use of DME;Decreased safety awareness;Decreased knowledge of precautions       PT Treatment Interventions DME instruction;Gait training;Functional mobility training;Stair training;Therapeutic activities;Balance training;Therapeutic exercise;Patient/family education    PT Goals (Current goals can be found in the Care Plan section)  Acute Rehab PT Goals Patient Stated Goal: to go home PT Goal Formulation: With patient Time For Goal Achievement: 02/06/18 Potential to Achieve Goals: Good    Frequency Min 3X/week   Barriers to discharge        Co-evaluation               AM-PAC PT "6 Clicks" Daily Activity  Outcome Measure Difficulty turning over in bed (including adjusting bedclothes, sheets and blankets)?: None Difficulty moving from lying on back to sitting on the side of the bed? : None Difficulty sitting down on and standing up from a chair with arms (e.g., wheelchair, bedside commode, etc,.)?: None Help needed moving to and from a bed to chair (including a wheelchair)?: A Little Help needed walking in hospital room?: A Little Help needed climbing 3-5 steps with a railing? : A Lot 6 Click Score: 20    End of Session Equipment Utilized During Treatment: Gait belt Activity Tolerance: Patient limited by fatigue Patient left: in chair;with call bell/phone within reach Nurse Communication: Mobility  status;Other (comment)(pt awaiting enema ) PT Visit Diagnosis: Unsteadiness on feet (R26.81);Muscle weakness (generalized) (M62.81)    Time: 0981-1914 PT Time Calculation (min) (ACUTE ONLY): 18 min   Charges:   PT Evaluation $PT Eval Moderate Complexity: 1 Mod     PT G Codes:        Genene Kilman,PT Acute Rehabilitation [phone] (pager)   Denice Paradise 01/23/2018, 4:47 PM

## 2018-01-23 NOTE — Progress Notes (Addendum)
Pt is fixated over not having a BM since 01/20/18. Pt received scheduled senokot. Pt given prune juice X2. RN txtpaged oncall NP. Given order for dulcolax and xanax. Pt have asked for tylenol for HA all night, as well as percocet for back pain. Pt also received her dose of valium at night. And she had repeatedly asked for more, hence, the xanax. Pt appears to be forgetful and at times cannot remember that she's gotten an extra dose of xanax or received laxative and prune juice. Pt gets irritated when RN tries to re-orient her. At approximately 4:00 am, pt asked RN for gloves so she can "dig in to her rectum" and disimpact herself.  Will monitor.

## 2018-01-23 NOTE — Progress Notes (Signed)
[facility] TEAM 1 - Stepdown/ICU TEAM  Angel [name]  SHF:[id number] DOB: 1932/01/16 DOA: 01/20/2018 PCP: Ann Held, DO    Brief Narrative:  84 y.o. female with a hx of HTN, dyslipidemia, CAD, PVCs, GERD, and breast cancer s/p B mastectomy with chemoradiation who presented with complaints of chest pain.  She normally experiences a burning pain across her chest that makes it sometimes hard for her to breathe when she exerts herself.  This pain has been present since she was diagnosed with breast cancer and had a double mastectomy with radiation.  However, over the 1-2 days prior to this admit her symptoms had been constant and present even at rest.    In the ED she was noted to be in A. fib with RVR with heart rates into the 150-160s.  She was noted to be tachypneic, but O2 saturations were maintained on room air.     Significant Events: 4/26 admit   Subjective: Pt is very angry that she has not had a bowel movement since her admit.  She has a strict bowel regimen she follows at home, and feels very uncomfortable now due to severe constipation due to being unable to follow her usual routine.  She also reports ongoing constant chest pain, SOB, abdom pain, nausea, and anorexia.  She does not appear to be in any acute distress.    Assessment & Plan:  Newly diagnosed Afib w/ RVR CHA2DS2 VASc score is 5 - felt to be poor candidate for long term anticoag due to possible dementia and fall risk - cardizem changed to amio in setting of hypotension - BB added - for DCCV on Wednesday - care per Cardiology   Hypotension w/ hx of HTN BP well controlled at this time   Normocytic Anemia Hgb was 11.9 01/11/18 - awaiting stool hemoccult - check anemia panel - follow trend - appears to have been a slowly progressive anemia on review of records   ?Cognitive deficits - ?Dementia Per RN "Pt appears to be forgetful and at times cannot remember that she's gotten an extra dose of xanax or  received laxative and prune juice." - TSH normal - check F02 and folic acid - obtain CT head once bowel issues stabilized to r/o acute findings and eval for extent of atrophy   CAD No symptoms to suggest true angina pectoris at this time  HLD Continue usual home medical therapy  COPD Well compensated at this time  Breast cancer s/p bilateral mastectomy Chest wall recurrence s/p resection in 2015 - no recurrent of disease since 2015 - maintained on fulvestrant q month - also receives denosumab q71months  Chronic pain followed by Dr Nelva Bush - she reported Dr Nelva Bush recently increased her pain meds to q6hrs prn - Dr Jana Hakim also recently prescribed valium    DVT prophylaxis: IV heparin  Code Status: FULL CODE Family Communication: no family present at time of exam  Disposition Plan: PT/OT - work on constipation - cognitive eval - anemia eval   Consultants:  Cardiology   Antimicrobials:  none  Objective: Blood pressure 126/81, pulse (!) 114, temperature 98.6 F (37 C), temperature source Oral, resp. rate 19, height 5\' 2"  (1.575 m), weight 54.8 kg (120 lb 14.4 oz), SpO2 96 %.  Intake/Output Summary (Last 24 hours) at 01/23/2018 1338 Last data filed at 01/23/2018 0917 Gross per 24 hour  Intake 673.58 ml  Output 1200 ml  Net -526.42 ml   Filed Weights   01/21/18 0500 01/22/18  4332 01/23/18 0900  Weight: 53 kg (116 lb 13.5 oz) 54.4 kg (119 lb 14.4 oz) 54.8 kg (120 lb 14.4 oz)    Examination: General: No acute respiratory distress Lungs: Clear to auscultation bilaterally without wheezes or crackles Cardiovascular: irreg irreg - rate mildly elevated - no M  Abdomen: diffusely tender, soft, no rebound, no appreciable masses, bowel sounds positive Extremities: 1+ edema bilateral lower extremities  CBC: Recent Labs  Lab 01/20/18 1445 01/22/18 0358 01/22/18 1049 01/23/18 0520  WBC 7.3 5.1 5.4 4.7  NEUTROABS 5.3  --   --   --   HGB 12.6 9.5* 9.8* 9.0*  HCT 39.3 30.5* 31.7*  28.5*  MCV 92.9 94.4 95.2 94.4  PLT 271 220 223 951   Basic Metabolic Panel: Recent Labs  Lab 01/20/18 1445 01/20/18 1624 01/21/18 0730 01/22/18 0358  NA 136  --  136 137  K 3.9  --  3.8 3.7  CL 102  --  105 105  CO2 23  --  19* 27  GLUCOSE 96  --  105* 111*  BUN 5*  --  5* 6  CREATININE 0.71  --  0.59 0.64  CALCIUM 9.1  --  8.0* 8.1*  MG  --  2.1  --  2.1   GFR: Estimated Creatinine Clearance: 42.1 mL/min (by C-G formula based on SCr of 0.64 mg/dL).  Liver Function Tests: Recent Labs  Lab 01/20/18 1445  AST 17  ALT 12*  ALKPHOS 36*  BILITOT 0.8  PROT 6.4*  ALBUMIN 3.7   Recent Labs  Lab 01/20/18 1624  LIPASE 20    Cardiac Enzymes: Recent Labs  Lab 01/20/18 2113 01/21/18 0216 01/21/18 0730  TROPONINI <0.03 <0.03 <0.03    HbA1C: Hgb A1c MFr Bld  Date/Time Value Ref Range Status  09/06/2014 10:42 AM 5.4 <5.7 % Final    Comment:                                                                           According to the ADA Clinical Practice Recommendations for 2011, when HbA1c is used as a screening test:     >=6.5%   Diagnostic of Diabetes Mellitus            (if abnormal result is confirmed)   5.7-6.4%   Increased risk of developing Diabetes Mellitus   References:Diagnosis and Classification of Diabetes Mellitus,Diabetes OACZ,6606,30(ZSWFU 1):S62-S69 and Standards of Medical Care in         Diabetes - 2011,Diabetes XNAT,5573,22 (Suppl 1):S11-S61.     05/01/2014 10:50 AM 5.6 <5.7 % Final    Comment:                                                                           According to the ADA Clinical Practice Recommendations for 2011, when HbA1c is used as a screening test:     >=6.5%   Diagnostic of Diabetes Mellitus            (  if abnormal result is confirmed)   5.7-6.4%   Increased risk of developing Diabetes Mellitus   References:Diagnosis and Classification of Diabetes Mellitus,Diabetes JSRP,5945,85(FYTWK 1):S62-S69 and Standards of  Medical Care in         Diabetes - 2011,Diabetes MQKM,6381,77 (Suppl 1):S11-S61.       Recent Results (from the past 240 hour(s))  Urine culture     Status: None   Collection Time: 01/20/18  4:24 PM  Result Value Ref Range Status   Specimen Description URINE, RANDOM  Final   Special Requests NONE  Final   Culture   Final    NO GROWTH Performed at [facility], [address] [place], [place] [address]    Report Status 01/21/2018 FINAL  Final  MRSA PCR Screening     Status: None   Collection Time: 01/20/18 10:18 PM  Result Value Ref Range Status   MRSA by PCR NEGATIVE NEGATIVE Final    Comment:        The GeneXpert MRSA Assay (FDA approved for NASAL specimens only), is one component of a comprehensive MRSA colonization surveillance program. It is not intended to diagnose MRSA infection nor to guide or monitor treatment for MRSA infections. Performed at [facility], [place] [address], [place], [place] [address]      Scheduled Meds: . atorvastatin  20 mg Oral Daily  . DULoxetine  60 mg Oral Daily  . ezetimibe  10 mg Oral Daily  . metoprolol tartrate  12.5 mg Oral BID  . mometasone-formoterol  2 puff Inhalation BID  . montelukast  10 mg Oral QHS  . senna-docusate  1 tablet Oral BID   Continuous Infusions: . amiodarone 30 mg/hr (01/23/18 0639)  . heparin 1,100 Units/hr (01/23/18 0915)     LOS: 3 days   Cherene Altes, MD [facility] Office  [phone] Pager - Text Page per Amion as per below:  On-Call/Text Page:      [email or web address]      password [email or web address]  If 7PM-7AM, please contact night-coverage [email or web address] Password [email or web address] 01/23/2018, 1:38 PM

## 2018-01-23 NOTE — Progress Notes (Signed)
[place] for heparin Indication: new onset atrial fibrillation  Patient Measurements: Height: 5\' 2"  (157.5 cm) Weight: 120 lb 14.4 oz (54.8 kg) IBW/kg (Calculated) : 50.1 Heparin Dosing Weight: 54 kg  Vital Signs: Temp: 98.5 F (36.9 C) (04/29 1618) Temp Source: Oral (04/29 1618) BP: 113/82 (04/29 1618) Pulse Rate: 120 (04/29 1618)  Labs: Recent Labs    01/20/18 2113 01/21/18 0216 01/21/18 0730  01/22/18 0358 01/22/18 1049 01/23/18 0520 01/23/18 1654  HGB  --   --   --    < > 9.5* 9.8* 9.0*  --   HCT  --   --   --   --  30.5* 31.7* 28.5*  --   PLT  --   --   --   --  220 223 207  --   HEPARINUNFRC  --   --  0.13*   < > 0.32  --  0.21* 0.11*  CREATININE  --   --  0.59  --  0.64  --   --   --   TROPONINI <0.03 <0.03 <0.03  --   --   --   --   --    < > = values in this interval not displayed.    Assessment: 84 yo female presented to ED on 4/26 with chest pain. Heparin for AFib. No anticoagulation prior to admission. Hgb slightly low from admit, 12.6 >9, obtaining FOBT. -Heparin level= 0.11 after increase to 1100 units/hr  Goal of Therapy:  Heparin level 0.3-0.7 units/ml Monitor platelets by anticoagulation protocol: Yes   Plan:  -Increase heparin to 1250 units/hr -Heparin level in 8 hours and daily wth CBC daily  Hildred Laser, PharmD Clinical Pharmacist 01/23/2018 6:26 PM

## 2018-01-24 ENCOUNTER — Encounter (HOSPITAL_COMMUNITY): Payer: Self-pay | Admitting: Anesthesiology

## 2018-01-24 ENCOUNTER — Other Ambulatory Visit: Payer: Self-pay

## 2018-01-24 DIAGNOSIS — F419 Anxiety disorder, unspecified: Secondary | ICD-10-CM

## 2018-01-24 DIAGNOSIS — F329 Major depressive disorder, single episode, unspecified: Secondary | ICD-10-CM

## 2018-01-24 DIAGNOSIS — I2510 Atherosclerotic heart disease of native coronary artery without angina pectoris: Secondary | ICD-10-CM

## 2018-01-24 DIAGNOSIS — I2583 Coronary atherosclerosis due to lipid rich plaque: Secondary | ICD-10-CM

## 2018-01-24 LAB — COMPREHENSIVE METABOLIC PANEL
ALBUMIN: 3.1 g/dL — AB (ref 3.5–5.0)
ALK PHOS: 25 U/L — AB (ref 38–126)
ALT: 11 U/L — ABNORMAL LOW (ref 14–54)
ANION GAP: 9 (ref 5–15)
AST: 16 U/L (ref 15–41)
BILIRUBIN TOTAL: 0.3 mg/dL (ref 0.3–1.2)
BUN: 22 mg/dL — AB (ref 6–20)
CO2: 27 mmol/L (ref 22–32)
Calcium: 8.5 mg/dL — ABNORMAL LOW (ref 8.9–10.3)
Chloride: 102 mmol/L (ref 101–111)
Creatinine, Ser: 0.59 mg/dL (ref 0.44–1.00)
GFR calc Af Amer: >60 mL/min (ref >60)
GLUCOSE: 88 mg/dL (ref 65–99)
POTASSIUM: 3.8 mmol/L (ref 3.5–5.1)
Sodium: 138 mmol/L (ref 135–145)
TOTAL PROTEIN: 5.4 g/dL — AB (ref 6.5–8.1)

## 2018-01-24 LAB — HEPARIN LEVEL (UNFRACTIONATED)
HEPARIN UNFRACTIONATED: 0.71 [IU]/mL — AB (ref 0.30–0.70)
Heparin Unfractionated: 0.63 IU/mL (ref 0.30–0.70)

## 2018-01-24 LAB — RETICULOCYTES
RBC.: 2.99 MIL/uL — AB (ref 3.87–5.11)
RETIC CT PCT: 2.4 % (ref 0.4–3.1)
Retic Count, Absolute: 71.8 10*3/uL (ref 19.0–186.0)

## 2018-01-24 LAB — CBC
HCT: 28.1 % — ABNORMAL LOW (ref 36.0–46.0)
HEMOGLOBIN: 8.9 g/dL — AB (ref 12.0–15.0)
MCH: 29.8 pg (ref 26.0–34.0)
MCHC: 31.7 g/dL (ref 30.0–36.0)
MCV: 94 fL (ref 78.0–100.0)
Platelets: 255 10*3/uL (ref 150–400)
RBC: 2.99 MIL/uL — AB (ref 3.87–5.11)
RDW: 13.5 % (ref 11.5–15.5)
WBC: 7.9 10*3/uL (ref 4.0–10.5)

## 2018-01-24 LAB — FOLATE: Folate: 15.7 ng/mL (ref >5.9)

## 2018-01-24 LAB — IRON AND TIBC
Iron: 12 ug/dL — ABNORMAL LOW (ref 28–170)
SATURATION RATIOS: 3 % — AB (ref 10.4–31.8)
TIBC: 364 ug/dL (ref 250–450)
UIBC: 352 ug/dL

## 2018-01-24 LAB — MAGNESIUM: MAGNESIUM: 2 mg/dL (ref 1.7–2.4)

## 2018-01-24 LAB — VITAMIN B12: VITAMIN B 12: 334 pg/mL (ref 180–914)

## 2018-01-24 LAB — FERRITIN: Ferritin: 14 ng/mL (ref 11–307)

## 2018-01-24 LAB — OCCULT BLOOD X 1 CARD TO LAB, STOOL: FECAL OCCULT BLD: POSITIVE — AB

## 2018-01-24 LAB — PHOSPHORUS: Phosphorus: 2.3 mg/dL — ABNORMAL LOW (ref 2.5–4.6)

## 2018-01-24 MED ORDER — LEVALBUTEROL HCL 0.63 MG/3ML IN NEBU
0.6300 mg | INHALATION_SOLUTION | RESPIRATORY_TRACT | Status: DC | PRN
  Administered 2018-01-27: 0.63 mg via RESPIRATORY_TRACT
  Filled 2018-01-24 (×2): qty 3

## 2018-01-24 MED ORDER — MAGNESIUM CITRATE PO SOLN
1.0000 | Freq: Every day | ORAL | Status: DC | PRN
Start: 2018-01-24 — End: 2018-01-30

## 2018-01-24 MED ORDER — ALPRAZOLAM 0.25 MG PO TABS
0.2500 mg | ORAL_TABLET | Freq: Once | ORAL | Status: AC
  Administered 2018-01-24: 0.25 mg via ORAL
  Filled 2018-01-24: qty 1

## 2018-01-24 MED ORDER — MORPHINE SULFATE (PF) 2 MG/ML IV SOLN
2.0000 mg | INTRAVENOUS | Status: DC | PRN
  Administered 2018-01-25 – 2018-01-26 (×4): 2 mg via INTRAVENOUS
  Filled 2018-01-24 (×4): qty 1

## 2018-01-24 MED ORDER — SODIUM CHLORIDE 0.9 % IV SOLN
INTRAVENOUS | Status: DC
  Administered 2018-01-24: 11:00:00 via INTRAVENOUS

## 2018-01-24 MED ORDER — SORBITOL 70 % SOLN
30.0000 mL | Freq: Every day | Status: DC | PRN
  Administered 2018-01-24: 30 mL via ORAL
  Filled 2018-01-24: qty 30

## 2018-01-24 MED ORDER — FLEET ENEMA 7-19 GM/118ML RE ENEM: 1.0000 | ENEMA | Freq: Every day | RECTAL | Status: DC | PRN

## 2018-01-24 MED ORDER — CYANOCOBALAMIN 1000 MCG/ML IJ SOLN
1000.0000 ug | Freq: Every day | INTRAMUSCULAR | Status: AC
  Administered 2018-01-24 – 2018-01-26 (×2): 1000 ug via SUBCUTANEOUS
  Filled 2018-01-24 (×3): qty 1

## 2018-01-24 MED ORDER — LACTULOSE 10 GM/15ML PO SOLN
30.0000 g | Freq: Two times a day (BID) | ORAL | Status: DC | PRN
Start: 2018-01-24 — End: 2018-01-30
  Administered 2018-01-24 – 2018-01-29 (×3): 30 g via ORAL
  Filled 2018-01-24 (×3): qty 45

## 2018-01-24 NOTE — Progress Notes (Signed)
Patient extremely anxious this am. Has not slept much the entire night. Paged [facility] asking for something for anxiety. PRN Valium given last night at bedtime per order. No new orders received at this time. Will continue to monitor.

## 2018-01-24 NOTE — Plan of Care (Signed)
  Problem: Education: Goal: Knowledge of General Education information will improve Outcome: Completed/Met

## 2018-01-24 NOTE — Progress Notes (Signed)
ANTICOAGULATION CONSULT NOTE  Pharmacy Consult for Heparin Indication: new onset atrial fibrillation  Patient Measurements: Height: 5\' 2"  (157.5 cm) Weight: 119 lb 11.2 oz (54.3 kg) IBW/kg (Calculated) : 50.1 Heparin Dosing Weight: 54 kg  Vital Signs: Temp: 98.2 F (36.8 C) (04/30 0526) Temp Source: Oral (04/30 0526) BP: 110/64 (04/30 0526) Pulse Rate: 99 (04/30 0526)  Labs: Recent Labs    01/21/18 0730  01/22/18 0358 01/22/18 1049 01/23/18 0520 01/23/18 1654 01/24/18 0447  HGB  --    < > 9.5* 9.8* 9.0*  --  8.9*  HCT  --    < > 30.5* 31.7* 28.5*  --  28.1*  PLT  --    < > 220 223 207  --  255  HEPARINUNFRC 0.13*   < > 0.32  --  0.21* 0.11* 0.63  CREATININE 0.59  --  0.64  --   --   --   --   TROPONINI <0.03  --   --   --   --   --   --    < > = values in this interval not displayed.    Assessment: 84 yo female presented to ED on 4/26 with chest pain. Heparin for AFib. No anticoagulation prior to admission. Hgb slightly low from admit, 12.6 >9, obtaining FOBT.  4/30 AM update: Hgb low but stable, heparin level therapeutic x 1 after rate increase  Goal of Therapy:  Heparin level 0.3-0.7 units/ml Monitor platelets by anticoagulation protocol: Yes   Plan:  Cont heparin at 1250 units/hr 1200 HL  Narda Bonds, PharmD, BCPS Clinical Pharmacist Phone: [phone]

## 2018-01-24 NOTE — Evaluation (Signed)
Occupational Therapy Evaluation Patient Details Name: Angel [name] MRN: [id number] DOB: 09/12/1932 Today's Date: 01/24/2018    History of Present Illness 84 y.o. female with a hx of HTN, dyslipidemia, CAD, PVCs, GERD, and breast cancer s/p B mastectomy with chemoradiation who presented with complaints of chest pain.  She normally experiences a burning pain across her chest that makes it sometimes hard for her to breathe when she exerts herself.  This pain has been present since she was diagnosed with breast cancer and had a double mastectomy with radiation.  However, over the 1-2 days prior to this admit her symptoms had been constant and present even at rest.  In the ED she was noted to be in A. fib with RVR with heart ratesinto the 150-160s. She was noted to be tachypneic,but O2 saturations were maintained on room air.Pt very upset that she has not had BM since admit.     Clinical Impression   PTA, pt reports living alone and being completely independent with ADL and functional mobility. Session limited today by increase in HR during toileting tasks (highly irregular 110-127 at rest seated in recliner and up to 150 during toileting) and pt reporting dizziness/nausea with RN notified. HR back to 110s after transfer back to bed. She was able to complete LB ADL and ambulating toilet transfers in room with supervision. However, noted significantly diminished safety awareness and short term memory deficits which impact pt's safety with ADL participation. She additionally demonstrates poor activity tolerance for ADL and functional mobility. She would benefit from continued OT services while admitted as well as home health OT follow-up. Recommend 24 hour supervision post-acute D/C.    Follow Up Recommendations  Supervision/Assistance - 24 hour;Home health OT    Equipment Recommendations  None recommended by OT    Recommendations for Other Services       Precautions / Restrictions  Precautions Precautions: Fall Restrictions Weight Bearing Restrictions: No      Mobility Bed Mobility Overal bed mobility: Independent                Transfers Overall transfer level: Needs assistance Equipment used: None Transfers: Sit to/from Stand Sit to Stand: Supervision         General transfer comment: Supervision for safety due to dizziness/nausea    Balance Overall balance assessment: Needs assistance Sitting-balance support: No upper extremity supported;Feet supported Sitting balance-Leahy Scale: Good     Standing balance support: No upper extremity supported;During functional activity Standing balance-Leahy Scale: Fair Standing balance comment: able to stand statically without UE support                           ADL either performed or assessed with clinical judgement   ADL Overall ADL's : Needs assistance/impaired Eating/Feeding: Set up;Sitting   Grooming: Set up;Sitting   Upper Body Bathing: Set up;Sitting   Lower Body Bathing: Supervison/ safety;Sit to/from stand   Upper Body Dressing : Set up;Sitting   Lower Body Dressing: Supervision/safety;Sit to/from stand   Toilet Transfer: Supervision/safety;Ambulation(very short distances due to HR)   Toileting- Clothing Manipulation and Hygiene: Supervision/safety;Sit to/from stand       Functional mobility during ADLs: Supervision/safety General ADL Comments: Pt with limited activity tolerance for ADL participation. She requires supervision for safety. Pt with HR 110-127 at rest in recliner. HR up to 150 while seated on BSC and rapidly changing.      Vision Baseline Vision/History: No visual deficits Vision Assessment?:  No apparent visual deficits     Perception     Praxis      Pertinent Vitals/Pain Pain Assessment: No/denies pain     Hand Dominance Right   Extremity/Trunk Assessment Upper Extremity Assessment Upper Extremity Assessment: Overall WFL for tasks assessed    Lower Extremity Assessment Lower Extremity Assessment: Overall WFL for tasks assessed   Cervical / Trunk Assessment Cervical / Trunk Assessment: Normal   Communication Communication Communication: No difficulties   Cognition Arousal/Alertness: Awake/alert Behavior During Therapy: WFL for tasks assessed/performed Overall Cognitive Status: No family/caregiver present to determine baseline cognitive functioning Area of Impairment: Orientation;Attention;Memory;Safety/judgement                 Orientation Level: Disoriented to;Time(able to correct when questioned by OT)   Memory: Decreased short-term memory   Safety/Judgement: Decreased awareness of safety;Decreased awareness of deficits     General Comments: Pt repeating herself throughout session with no recall that she had already told OT each story about her family.    General Comments  Pt reporting headache, nausea, dizziness, and generally not feeling well.     Exercises     Shoulder Instructions      Home Living Family/patient expects to be discharged to:: Private residence Living Arrangements: Alone Available Help at Discharge: Family;Available PRN/intermittently Type of Home: Apartment Home Access: Stairs to enter Entrance Stairs-Number of Steps: 5 Entrance Stairs-Rails: Left Home Layout: One level     Bathroom Shower/Tub: Teacher, early years/pre: Standard     Home Equipment: Cane - single point;Tub bench;Shower seat(tub bench vs seat - pt described seat but stated bench)   Additional Comments: No family present during session. She reports her son lives nearby but not available for assistance.       Prior Functioning/Environment Level of Independence: Independent        Comments: Pt states to OT that she does not need any assistance at home. She drives and completes IADL independently. Pt reports that her son lives nearby. Note, reported to PT that she could stay with him if needed.          OT Problem List: Decreased activity tolerance;Impaired balance (sitting and/or standing);Decreased cognition;Decreased safety awareness;Cardiopulmonary status limiting activity      OT Treatment/Interventions: Self-care/ADL training;Therapeutic exercise;Energy conservation;DME and/or AE instruction;Therapeutic activities;Patient/family education;Balance training;Cognitive remediation/compensation    OT Goals(Current goals can be found in the care plan section) Acute Rehab OT Goals Patient Stated Goal: to go home OT Goal Formulation: With patient Time For Goal Achievement: 02/07/18 Potential to Achieve Goals: Good ADL Goals Pt Will Perform Grooming: with modified independence;standing Pt Will Perform Lower Body Dressing: with modified independence;sit to/from stand Pt Will Transfer to Toilet: with modified independence;ambulating;bedside commode Pt Will Perform Toileting - Clothing Manipulation and hygiene: with modified independence;sit to/from stand Pt Will Perform Tub/Shower Transfer: with modified independence;ambulating;rolling walker;tub bench;shower seat;Tub transfer  OT Frequency: Min 2X/week   Barriers to D/C:            Co-evaluation              AM-PAC PT "6 Clicks" Daily Activity     Outcome Measure Help from another person eating meals?: None Help from another person taking care of personal grooming?: A Little Help from another person toileting, which includes using toliet, bedpan, or urinal?: A Little Help from another person bathing (including washing, rinsing, drying)?: A Little Help from another person to put on and taking off regular upper body clothing?: None  Help from another person to put on and taking off regular lower body clothing?: A Little 6 Click Score: 20   End of Session Nurse Communication: Mobility status(pt nauseated)  Activity Tolerance: Treatment limited secondary to medical complications (Comment)(elevated HR) Patient left: in  bed;with call bell/phone within reach  OT Visit Diagnosis: Other abnormalities of gait and mobility (R26.89);Other symptoms and signs involving cognitive function                Time: 1245-1305 OT Time Calculation (min): 20 min Charges:  OT General Charges $OT Visit: 1 Visit OT Evaluation $OT Eval Moderate Complexity: 1 Mod G-Codes:     Norman Herrlich, MS OTR/L  Pager: [place] 01/24/2018, 1:49 PM

## 2018-01-24 NOTE — Progress Notes (Addendum)
The patient appears to have blood in her stools. Sent an Occult card to lab and notified MD Mcclung. I will continue to monitor the patient closely.   Saddie Benders RN  Occult positive will notify the MD.

## 2018-01-24 NOTE — Progress Notes (Addendum)
Progress Note  Patient Name: Angel [name] Date of Encounter: 01/24/2018  Primary Cardiologist:  Dr. Radford Pax   Subjective   Feels poorly. Did not get any sleep last night. A bit anxious.   Inpatient Medications    Scheduled Meds: . atorvastatin  20 mg Oral Daily  . DULoxetine  60 mg Oral Daily  . ezetimibe  10 mg Oral Daily  . metoprolol tartrate  12.5 mg Oral BID  . mometasone-formoterol  2 puff Inhalation BID  . montelukast  10 mg Oral QHS  . multivitamin with minerals  1 tablet Oral Daily  . polyethylene glycol  17 g Oral BID  . senna-docusate  1 tablet Oral BID   Continuous Infusions: . amiodarone 30 mg/hr (01/24/18 0609)  . heparin 1,250 Units/hr (01/23/18 2337)   PRN Meds: acetaminophen, bisacodyl, diazepam, gi cocktail, hydroxypropyl methylcellulose / hypromellose, ipratropium-albuterol, morphine injection, ondansetron (ZOFRAN) IV, oxyCODONE-acetaminophen   Vital Signs    Vitals:   01/24/18 0236 01/24/18 0526 01/24/18 0752 01/24/18 0812  BP: 114/88 110/64 106/64   Pulse:  99 (!) 103 (!) 119  Resp:      Temp:  98.2 F (36.8 C) 98.5 F (36.9 C)   TempSrc:  Oral Oral   SpO2:  98% 94%   Weight:  119 lb 11.2 oz (54.3 kg)    Height:        Intake/Output Summary (Last 24 hours) at 01/24/2018 0824 Last data filed at 01/24/2018 0630 Gross per 24 hour  Intake 1841.51 ml  Output 1000 ml  Net 841.51 ml   Filed Weights   01/22/18 0539 01/23/18 0900 01/24/18 0526  Weight: 119 lb 14.4 oz (54.4 kg) 120 lb 14.4 oz (54.8 kg) 119 lb 11.2 oz (54.3 kg)    Telemetry    Atrial fibrillation w/ RVR in the 120s-130s. - Personally Reviewed  ECG    Atrial fibrillation w/ RVR - Personally Reviewed  Physical Exam   GEN: No acute distress. elderly Neck: No JVD Cardiac: irreguarlly irregular, tachy rate, no murmurs, rubs, or gallops.  Respiratory: Clear to auscultation bilaterally. GI: Soft, nontender, non-distended  MS: No edema; No deformity. Neuro:  Nonfocal   Psych: Normal affect   Labs    Chemistry Recent Labs  Lab 01/20/18 1445 01/21/18 0730 01/22/18 0358 01/24/18 0447  NA 136 136 137 138  K 3.9 3.8 3.7 3.8  CL 102 105 105 102  CO2 23 19* 27 27  GLUCOSE 96 105* 111* 88  BUN 5* 5* 6 22*  CREATININE 0.71 0.59 0.64 0.59  CALCIUM 9.1 8.0* 8.1* 8.5*  PROT 6.4*  --   --  5.4*  ALBUMIN 3.7  --   --  3.1*  AST 17  --   --  16  ALT 12*  --   --  11*  ALKPHOS 36*  --   --  25*  BILITOT 0.8  --   --  0.3  GFRNONAA >60 >60 >60 >60  GFRAA >60 >60 >60 >60  ANIONGAP 11 12 5 9      Hematology Recent Labs  Lab 01/22/18 1049 01/23/18 0520 01/24/18 0447  WBC 5.4 4.7 7.9  RBC 3.33* 3.02* 2.99*  2.99*  HGB 9.8* 9.0* 8.9*  HCT 31.7* 28.5* 28.1*  MCV 95.2 94.4 94.0  MCH 29.4 29.8 29.8  MCHC 30.9 31.6 31.7  RDW 13.6 13.6 13.5  PLT 223 207 255    Cardiac Enzymes Recent Labs  Lab 01/20/18 2113 01/21/18 0216 01/21/18 0730  TROPONINI <0.03 <0.03 <0.03    Recent Labs  Lab 01/20/18 1454  TROPIPOC 0.00     BNP Recent Labs  Lab 01/20/18 1624  BNP 455.0*     DDimer No results for input(s): DDIMER in the last 168 hours.   Radiology    No results found.  Cardiac Studies   2D Echo 01/21/18 Study Conclusions  - Left ventricle: The cavity size was normal. Systolic function was   normal. The estimated ejection fraction was in the range of 60%   to 65%. Wall motion was normal; there were no regional wall   motion abnormalities. The study was not technically sufficient to   allow evaluation of LV diastolic dysfunction due to atrial   fibrillation. - Aortic valve: Trileaflet; mildly thickened, mildly calcified   leaflets. Sclerosis without stenosis. - Mitral valve: There was mild to moderate regurgitation. - Left atrium: The atrium was normal in size. - Right ventricle: Systolic function was normal. - Tricuspid valve: There was mild regurgitation. - Pulmonic valve: There was mild regurgitation. - Pulmonary arteries:  Systolic pressure was within the normal   range. - Inferior vena cava: The vessel was normal in size. - Pericardium, extracardiac: There was no pericardial effusion.  Impressions:  - When compared to the prior study from 08/20/2015 LVEF has   improved from 50% to 60-65%.   Patient Profile     84 y.o. female with a hx of non-obstructive CAD (50-70% mLAD by last cath 10/2014),mild-mod MR by echo this admission,DDD, dyslipidemia, breast CA (tx with mastectomy, radiation, meds - followed by onc), remote asthma, GERD, reflux esophagitis, PVCs,pre-DM, anemia,and depressionwho is being followed for the evaluation of atrial fibat the request of Dr. Erlinda Hong.   Assessment & Plan    1. Newly Recognized Atrial Fibrillation:  2D echo w/ normal LVEF. Normal atrial size. TSH WNL. K and Mg WNL. Pt remains in afib w/ RVR in the 120s-130s. BP soft but stable. She is on IV amiodarone, IV heparin and PO metoprolol. Plan for TEE/DCCV today.   2. Hypotension: resolved. BP remains soft but stable in the low 301S systolic. Continue to monitor.   3. CAD:  Moderate nonobstructive CAD noted on cath in 10/2014 with 50-70% mLAD. She has had mild chest disconfort, "buring sensation" but troponins are negative. Symptoms may be related to rapid afib. Plan for DCCV.   4. HLD: LDL is above recommended goal of < 70 mg/Dl. LDL is 101, however there are concerns regarding cognitive effects associated with statin use. Pt w/ reported memory deficits. Statin currently on hold.   5. Anemia: drop in hgb from 12>>9.  Retic count elevated. Folate and B12 WNL.  Iron low at 12.  FOBT ordered but not collected. Pt has not had a BM in several days.   For questions or updates, please contact [place] Please consult [email or web address] for contact info under Cardiology/STEMI.      Signed, Lyda Jester, PA-C  01/24/2018, 8:24 AM    The patient was seen, examined and discussed with Brittainy M. Rosita Fire, PA-C and I agree  with the above.   She remains in a-fib with RVR, on amiodarone drip, BP still borderline, plan for TEE/DCCV this afternoon. I will start iv hydration as she is NPO.  Ena Dawley, MD 01/24/2018

## 2018-01-24 NOTE — Care Management Important Message (Signed)
Important Message  Patient Details  Name: Angel [name] MRN: [id number] Date of Birth: 1932/01/05   Medicare Important Message Given:  Yes    Orbie Pyo 01/24/2018, 12:36 PM

## 2018-01-24 NOTE — Progress Notes (Signed)
[facility] TEAM 1 - Stepdown/ICU TEAM  Angel [name]  QMV:[id number] DOB: 25-Aug-1932 DOA: 01/20/2018 PCP: Ann Held, DO    Brief Narrative:  84yo F w/ a hx of HTN, dyslipidemia, CAD, PVCs, GERD, and breast cancer s/p B mastectomies with chemo/radiation who presented with chest pain.  She normally experiences a burning pain across her chest that makes it hard for her to breathe when she exerts herself.  This pain has been present since she was diagnosed with breast cancer and had a double mastectomy with radiation.  However, over the 1-2 days prior to this admit her symptoms became constant and present even at rest.    In the ED she was noted to be in A. fib with RVR with heart rates into the 150-160s.  She was noted to be tachypneic, but O2 saturations were maintained on room air.    Significant Events: 4/26 admit  4/27 TTE - 60-65% - no WMA   Subjective: The patient reports that she still "feels terrible."  She reports that she had a "small bowel movement" yesterday but that "that was not enough and I want to have a bowel movement every single day."  She reports low-grade dyspnea but no chest pain.  She denies nausea or vomiting.  Assessment & Plan:  Newly diagnosed Afib w/ RVR CHA2DS2 VASc score is 5 - felt to be poor candidate for long term anticoag due to possible dementia and fall risk - cardizem changed to amio in setting of hypotension - BB added - for DCCV today per Cardiology   Hypotension w/ hx of HTN BP well controlled at this time, w/ episodes of modest hypotension   Normocytic Anemia Hgb was 11.9 01/11/18 - anemia panel c/w Fe deficiency - follow Hgb trend - appears to have been a slowly progressive anemia on review of records, suggestive of low grade occult GIB - worrisome for colon lesion/CA - this provides another reason to avoid anticoag - await stool hemoccult - determination will need to be made in regards to appropriateness of GI eval - will obtain  CT abdom/pelvis to look for gross lesions once cardiac issues are more stable   ?Cognitive deficits - ?Dementia Per RN "Pt appears to be forgetful and at times cannot remember that she's gotten an extra dose of xanax or received laxative and prune juice." - TSH normal -  B12 marginal therefore will replace - folic acid normal - obtain CT head once bowel issues stabilized to r/o acute findings and eval for extent of atrophy - check RPR and ammonia level for completeness  CAD No symptoms to suggest true angina pectoris at this time  HLD Continue usual home medical therapy  COPD Well compensated  Breast cancer s/p bilateral mastectomy Chest wall recurrence s/p resection in 2015 - no recurrent of disease since 2015 - maintained on fulvestrant q month - also receives denosumab q77months  Chronic pain followed by Dr Nelva Bush - she reported Dr Nelva Bush recently increased her pain meds to q6hrs prn - Dr Jana Hakim also recently prescribed valium - pain appears well controlled at this time   DVT prophylaxis: IV heparin  Code Status: FULL CODE Family Communication: no family present at time of exam  Disposition Plan: PT/OT - cognitive eval - anemia eval - DCCV pending   Consultants:  Cardiology   Antimicrobials:  none  Objective: Blood pressure 106/64, pulse (!) 119, temperature 98.5 F (36.9 C), temperature source Oral, resp. rate 19, height 5\' 2"  (1.575  m), weight 54.3 kg (119 lb 11.2 oz), SpO2 94 %.  Intake/Output Summary (Last 24 hours) at 01/24/2018 0902 Last data filed at 01/24/2018 0900 Gross per 24 hour  Intake 2081.51 ml  Output 1000 ml  Net 1081.51 ml   Filed Weights   01/22/18 0539 01/23/18 0900 01/24/18 0526  Weight: 54.4 kg (119 lb 14.4 oz) 54.8 kg (120 lb 14.4 oz) 54.3 kg (119 lb 11.2 oz)    Examination: General: No acute respiratory distress - alert and conversant  Lungs: Clear to auscultation B - no wheezing  Cardiovascular: irreg irreg - rate variable - no M or rub    Abdomen: less tender, soft, no rebound, no appreciable masses, bowel sounds positive Extremities: trace edema bilateral lower extremities  CBC: Recent Labs  Lab 01/20/18 1445  01/22/18 1049 01/23/18 0520 01/24/18 0447  WBC 7.3   < > 5.4 4.7 7.9  NEUTROABS 5.3  --   --   --   --   HGB 12.6   < > 9.8* 9.0* 8.9*  HCT 39.3   < > 31.7* 28.5* 28.1*  MCV 92.9   < > 95.2 94.4 94.0  PLT 271   < > 223 207 255   < > = values in this interval not displayed.   Basic Metabolic Panel: Recent Labs  Lab 01/20/18 1624 01/21/18 0730 01/22/18 0358 01/24/18 0447  NA  --  136 137 138  K  --  3.8 3.7 3.8  CL  --  105 105 102  CO2  --  19* 27 27  GLUCOSE  --  105* 111* 88  BUN  --  5* 6 22*  CREATININE  --  0.59 0.64 0.59  CALCIUM  --  8.0* 8.1* 8.5*  MG 2.1  --  2.1 2.0  PHOS  --   --   --  2.3*   GFR: Estimated Creatinine Clearance: 42.1 mL/min (by C-G formula based on SCr of 0.59 mg/dL).  Liver Function Tests: Recent Labs  Lab 01/20/18 1445 01/24/18 0447  AST 17 16  ALT 12* 11*  ALKPHOS 36* 25*  BILITOT 0.8 0.3  PROT 6.4* 5.4*  ALBUMIN 3.7 3.1*   Recent Labs  Lab 01/20/18 1624  LIPASE 20    Cardiac Enzymes: Recent Labs  Lab 01/20/18 2113 01/21/18 0216 01/21/18 0730  TROPONINI <0.03 <0.03 <0.03    HbA1C: Hgb A1c MFr Bld  Date/Time Value Ref Range Status  09/06/2014 10:42 AM 5.4 <5.7 % Final    Comment:                                                                           According to the ADA Clinical Practice Recommendations for 2011, when HbA1c is used as a screening test:     >=6.5%   Diagnostic of Diabetes Mellitus            (if abnormal result is confirmed)   5.7-6.4%   Increased risk of developing Diabetes Mellitus   References:Diagnosis and Classification of Diabetes Mellitus,Diabetes PYPP,5093,26(ZTIWP 1):S62-S69 and Standards of Medical Care in         Diabetes - 2011,Diabetes YKDX,8338,25 (Suppl 1):S11-S61.     05/01/2014 10:50 AM 5.6  <5.7 %  Final    Comment:                                                                           According to the ADA Clinical Practice Recommendations for 2011, when HbA1c is used as a screening test:     >=6.5%   Diagnostic of Diabetes Mellitus            (if abnormal result is confirmed)   5.7-6.4%   Increased risk of developing Diabetes Mellitus   References:Diagnosis and Classification of Diabetes Mellitus,Diabetes SWNI,6270,35(KKXFG 1):S62-S69 and Standards of Medical Care in         Diabetes - 2011,Diabetes HWEX,9371,69 (Suppl 1):S11-S61.       Recent Results (from the past 240 hour(s))  Urine culture     Status: None   Collection Time: 01/20/18  4:24 PM  Result Value Ref Range Status   Specimen Description URINE, RANDOM  Final   Special Requests NONE  Final   Culture   Final    NO GROWTH Performed at [facility], [address] [place], [place] [address]    Report Status 01/21/2018 FINAL  Final  MRSA PCR Screening     Status: None   Collection Time: 01/20/18 10:18 PM  Result Value Ref Range Status   MRSA by PCR NEGATIVE NEGATIVE Final    Comment:        The GeneXpert MRSA Assay (FDA approved for NASAL specimens only), is one component of a comprehensive MRSA colonization surveillance program. It is not intended to diagnose MRSA infection nor to guide or monitor treatment for MRSA infections. Performed at [facility], [place] [address], [place], [place] [address]      Scheduled Meds: . atorvastatin  20 mg Oral Daily  . DULoxetine  60 mg Oral Daily  . ezetimibe  10 mg Oral Daily  . metoprolol tartrate  12.5 mg Oral BID  . mometasone-formoterol  2 puff Inhalation BID  . montelukast  10 mg Oral QHS  . multivitamin with minerals  1 tablet Oral Daily  . polyethylene glycol  17 g Oral BID  . senna-docusate  1 tablet Oral BID   Continuous Infusions: . sodium chloride    . amiodarone 30 mg/hr (01/24/18 0609)  . heparin 1,250 Units/hr (01/23/18  2337)     LOS: 4 days   Cherene Altes, MD [facility] Office  [phone] Pager - Text Page per Amion as per below:  On-Call/Text Page:      [email or web address]      password [email or web address]  If 7PM-7AM, please contact night-coverage [email or web address] Password [email or web address] 01/24/2018, 9:02 AM

## 2018-01-24 NOTE — Progress Notes (Signed)
[place] for Heparin Indication: new onset atrial fibrillation  Patient Measurements: Height: 5\' 2"  (157.5 cm) Weight: 119 lb 11.2 oz (54.3 kg) IBW/kg (Calculated) : 50.1 Heparin Dosing Weight: 54 kg  Vital Signs: Temp: 97.8 F (36.6 C) (04/30 1206) Temp Source: Oral (04/30 1206) BP: 107/72 (04/30 1206) Pulse Rate: 119 (04/30 0812)  Labs: Recent Labs    01/22/18 0358 01/22/18 1049 01/23/18 0520 01/23/18 1654 01/24/18 0447 01/24/18 1455  HGB 9.5* 9.8* 9.0*  --  8.9*  --   HCT 30.5* 31.7* 28.5*  --  28.1*  --   PLT 220 223 207  --  255  --   HEPARINUNFRC 0.32  --  0.21* 0.11* 0.63 0.71*  CREATININE 0.64  --   --   --  0.59  --     Assessment: 84 yo female presented to ED on 4/26 with chest pain. Heparin for AFib. No anticoagulation prior to admission. Hgb slightly low from admit, 12.6 >9, FOBT positive. Plans noted for DCCV today.  -heparin level= 0.71 on 1250 units/hr  Goal of Therapy:  Heparin level 0.3-0.5 Monitor platelets by anticoagulation protocol: Yes   Plan:  -Decrease heparin to 1150units/hr -Change heparin goal to 0.3-0.5 -Heparin level and CBC in am  Hildred Laser, PharmD Clinical Pharmacist 01/24/2018 4:03 PM

## 2018-01-25 ENCOUNTER — Encounter (HOSPITAL_COMMUNITY)
Admission: EM | Disposition: A | Discharge status: Skilled Nursing Facility | Payer: Self-pay | Source: Home / Self Care | Attending: Family Medicine

## 2018-01-25 DIAGNOSIS — K2901 Acute gastritis with bleeding: Secondary | ICD-10-CM

## 2018-01-25 DIAGNOSIS — D62 Acute posthemorrhagic anemia: Secondary | ICD-10-CM | POA: Diagnosis not present

## 2018-01-25 LAB — CBC
HCT: 17.3 % — ABNORMAL LOW (ref 36.0–46.0)
HEMATOCRIT: 17.4 % — AB (ref 36.0–46.0)
HEMOGLOBIN: 5.6 g/dL — AB (ref 12.0–15.0)
Hemoglobin: 5.4 g/dL — CL (ref 12.0–15.0)
MCH: 30.1 pg (ref 26.0–34.0)
MCH: 29.2 pg (ref 26.0–34.0)
MCHC: 32.2 g/dL (ref 30.0–36.0)
MCHC: 31.2 g/dL (ref 30.0–36.0)
MCV: 93.5 fL (ref 78.0–100.0)
MCV: 93.5 fL (ref 78.0–100.0)
PLATELETS: 189 10*3/uL (ref 150–400)
Platelets: 181 10*3/uL (ref 150–400)
RBC: 1.86 MIL/uL — ABNORMAL LOW (ref 3.87–5.11)
RBC: 1.85 MIL/uL — ABNORMAL LOW (ref 3.87–5.11)
RDW: 13.8 % (ref 11.5–15.5)
RDW: 13.7 % (ref 11.5–15.5)
WBC: 6.3 10*3/uL (ref 4.0–10.5)
WBC: 5.8 10*3/uL (ref 4.0–10.5)

## 2018-01-25 LAB — RPR: RPR: NONREACTIVE

## 2018-01-25 LAB — COMPREHENSIVE METABOLIC PANEL
ALBUMIN: 2.6 g/dL — AB (ref 3.5–5.0)
ALK PHOS: 36 U/L — AB (ref 38–126)
ALT: 11 U/L — AB (ref 14–54)
ANION GAP: 3 — AB (ref 5–15)
AST: 16 U/L (ref 15–41)
BUN: 21 mg/dL — AB (ref 6–20)
CALCIUM: 8 mg/dL — AB (ref 8.9–10.3)
CO2: 27 mmol/L (ref 22–32)
CREATININE: 0.59 mg/dL (ref 0.44–1.00)
Chloride: 110 mmol/L (ref 101–111)
GFR calc Af Amer: >60 mL/min (ref >60)
GFR calc non Af Amer: >60 mL/min (ref >60)
GLUCOSE: 86 mg/dL (ref 65–99)
Potassium: 3.4 mmol/L — ABNORMAL LOW (ref 3.5–5.1)
SODIUM: 140 mmol/L (ref 135–145)
TOTAL PROTEIN: 4.5 g/dL — AB (ref 6.5–8.1)
Total Bilirubin: 0.3 mg/dL (ref 0.3–1.2)

## 2018-01-25 LAB — HEPARIN LEVEL (UNFRACTIONATED): HEPARIN UNFRACTIONATED: 0.86 [IU]/mL — AB (ref 0.30–0.70)

## 2018-01-25 LAB — HEMOGLOBIN AND HEMATOCRIT, BLOOD
HCT: 16.2 % — ABNORMAL LOW (ref 36.0–46.0)
HEMATOCRIT: 23.7 % — AB (ref 36.0–46.0)
Hemoglobin: 5.1 g/dL — CL (ref 12.0–15.0)
Hemoglobin: 7.8 g/dL — ABNORMAL LOW (ref 12.0–15.0)

## 2018-01-25 LAB — PROTIME-INR
INR: 1.25
PROTHROMBIN TIME: 15.6 s — AB (ref 11.4–15.2)

## 2018-01-25 LAB — ABO/RH: ABO/RH(D): A POS

## 2018-01-25 LAB — AMMONIA: Ammonia: 12 umol/L (ref 9–35)

## 2018-01-25 LAB — PREPARE RBC (CROSSMATCH)

## 2018-01-25 LAB — APTT: aPTT: 128 seconds — ABNORMAL HIGH (ref 24–36)

## 2018-01-25 LAB — HIV ANTIBODY (ROUTINE TESTING W REFLEX): HIV Screen 4th Generation wRfx: NONREACTIVE

## 2018-01-25 SURGERY — ECHOCARDIOGRAM, TRANSESOPHAGEAL
Anesthesia: Monitor Anesthesia Care

## 2018-01-25 MED ORDER — SODIUM CHLORIDE 0.9 % IV SOLN: Freq: Once | INTRAVENOUS | Status: DC

## 2018-01-25 NOTE — Progress Notes (Signed)
Notified by lab of critical hemoglobin value 5.6.  Text-paged on-call. Monitoring patient closely.

## 2018-01-25 NOTE — Progress Notes (Signed)
[place] for Heparin Indication: new onset atrial fibrillation  Patient Measurements: Height: 5\' 2"  (157.5 cm) Weight: 119 lb 11.2 oz (54.3 kg) IBW/kg (Calculated) : 50.1 Heparin Dosing Weight: 54 kg  Vital Signs: Temp: 98.1 F (36.7 C) (05/01 0029) Temp Source: Oral (05/01 0029) BP: 103/45 (05/01 0029) Pulse Rate: 76 (05/01 0029)  Labs: Recent Labs    01/23/18 0520  01/24/18 0447 01/24/18 1455 01/25/18 0330  HGB 9.0*  --  8.9*  --  5.6*  HCT 28.5*  --  28.1*  --  17.4*  PLT 207  --  255  --  181  HEPARINUNFRC 0.21*   < > 0.63 0.71* 0.86*  CREATININE  --   --  0.59  --   --    < > = values in this interval not displayed.    Assessment: 84 yo female presented to ED on 4/26 with chest pain. Heparin for AFib. No anticoagulation prior to admission. Hgb slightly low from admit, 12.6 >9, FOBT positive.   -heparin level= 0.86 units/hr, Hg 5.6.  RN has paged MD and is awaiting call back.  Goal of Therapy:  Heparin level 0.3-0.5 Monitor platelets by anticoagulation protocol: Yes   Plan:  -Decrease heparin to 900units/hr -f/u plan for heparin  Thanks for allowing pharmacy to be a part of this patient's care.  Excell Seltzer, PharmD Clinical Pharmacist 01/25/2018 4:46 AM

## 2018-01-25 NOTE — Plan of Care (Signed)
  Problem: Activity: Goal: Risk for activity intolerance will decrease Outcome: Progressing   

## 2018-01-25 NOTE — Consult Note (Signed)
   [facility] CM Inpatient Consult   01/25/2018  Angel [name] Aug 21, 1932 [id number]   Referral received. Made inpatient RNCM, Hassan Rowan aware that [facility] will follow up as the patient stabilizes.  For questions, please contact:  Natividad Brood, RN BSN [facility] Liaison  [phone] business mobile phone Toll free office [phone]

## 2018-01-25 NOTE — Progress Notes (Signed)
[place] TEAM 1 - Stepdown/ICU TEAM  Angel [name]  IDP:[id number] DOB: 1932/05/06 DOA: 01/20/2018 PCP: Ann Held, DO    Brief Narrative:  84yo F w/ a hx of HTN, dyslipidemia, CAD, PVCs, GERD, and breast cancer s/p B mastectomies with chemo/radiation who presented with chest pain.  She normally experiences a burning pain across her chest that makes it hard for her to breathe when she exerts herself.  This pain has been present since she was diagnosed with breast cancer and had a double mastectomy with radiation.  However, over the 1-2 days prior to this admit her symptoms became constant and present even at rest.   In the ED she was noted to be in A. fib with RVR with heart rates into the 150-160s.  She was noted to be tachypneic, but O2 saturations were maintained on room air.   _________________________________________________________________________________ Significant Events: 4/26 admit  4/27 -TTE - 60-65% - no WMA  5/1 - hemoglobin was 5.1 -packed red blood cell transfusion was initiated -----------------------------------------------------------------------------------------------------------------------------------  Subjective:  Notified this morning by nursing staff the patient's hemoglobin has dropped being down this morning at 5.1, patient is mildly hypotensive still awake alert. Patient was seen and examined, pale, lethargic but awake alert able to hold conversation, Hemoccult has been positive, no frank rectal bleeding was reported. She has been on heparin drip, on the cardiac standpoint.  Heparin drip was DC'd, stat blood transfusion has been initiated  The pros and cons of blood transfusion was discussed with the patient in detail she current expressed understanding and agreement with transfusion.  Currently stable aside from generalized weakness, mild dizziness denies any shortness of breath chest pain.   Assessment & Plan:  Acute GI bleed,  exacerbated possibly by heparin drip, severe hemorrhagic anemia secondary to GI bleed -Hemoglobin 12.0->9->5.6--->5.1, positive Hemoccult -GI consulted -Is baseline hemoglobin greater than 12, currently patient being symptomatic dizzy hypotensive, and A. fib with RVR -we will start with packed red blood cell transfusion, criteria she meets 4 units, will transfuse to slowly, hold IV fluids -DC heparin drip GI bleed.   Newly diagnosed Afib w/ RVR -  CHA2DS2 VASc score is 5 -urology following, patient is obviously not a candidate for chronic anticoagulation due to his known dementia and GI bleed Artisan was changed to amiodarone, beta-blocker was added -Patient was scheduled to go on Dr. TEE DCCV very yesterday per nursing staff patient ate also EKG showed conversion to normal sinus rhythm -Cardiology following closely   Coronary artery disease-moderate nonobstructive coronary artery disease on cath 11/16/2014 with 50 to 70% stenosis of LAD -Continue medical management per record, beta-blocker, statins  Hypotension w/ hx of HTN BP well controlled at this time, w/ episodes of modest hypotension   Normocytic Anemia -exacerbated by hemorrhagic anemia Hgb was 11.9 01/11/18 - anemia panel c/w Fe deficiency -  -Gastroneurology team was consulted this morning for evaluation recommendations    ?Cognitive deficits - ?Dementia Per RN "Pt appears to be forgetful and at times cannot remember that she's gotten an extra dose of xanax or received laxative and prune juice." - TSH normal -  B12 marginal therefore will replace - folic acid normal - obtain CT head once bowel issues stabilized to r/o acute findings and eval for extent of atrophy - check RPR and ammonia level for completeness    HLD Continue usual home medical therapy  COPD Well compensated  Breast cancer s/p bilateral mastectomy Chest wall recurrence s/p resection in 2015 -  no recurrent of disease since 2015 - maintained on  fulvestrant q month - also receives denosumab q1months  Chronic pain followed by Dr Nelva Bush - she reported Dr Nelva Bush recently increased her pain meds to q6hrs prn - Dr Jana Hakim also recently prescribed valium - pain appears well controlled at this time   DVT prophylaxis: IV heparin  Code Status: FULL CODE Family Communication: no family present at time of exam  Disposition Plan: PT/OT - cognitive eval - anemia eval - DCCV pending   Consultants:  Cardiology   Antimicrobials:  none  Objective: Blood pressure (!) 101/43, pulse 79, temperature 98.8 F (37.1 C), temperature source Oral, resp. rate 16, height 5\' 2"  (1.575 m), weight 53.8 kg (118 lb 9.6 oz), SpO2 97 %.  Intake/Output Summary (Last 24 hours) at 01/25/2018 1510 Last data filed at 01/25/2018 1300 Gross per 24 hour  Intake 3104.09 ml  Output 1 ml  Net 3103.09 ml   Filed Weights   01/23/18 0900 01/24/18 0526 01/25/18 0432  Weight: 54.8 kg (120 lb 14.4 oz) 54.3 kg (119 lb 11.2 oz) 53.8 kg (118 lb 9.6 oz)    Examination: General: No acute respiratory distress - alert and conversant  Lungs: Clear to auscultation B - no wheezing  Cardiovascular: irreg irreg - rate variable - no M or rub  Abdomen: less tender, soft, no rebound, no appreciable masses, bowel sounds positive Extremities: trace edema bilateral lower extremities  CBC: Recent Labs  Lab 01/20/18 1445  01/24/18 0447 01/25/18 0330 01/25/18 0605 01/25/18 0903  WBC 7.3   < > 7.9 6.3  --  5.8  NEUTROABS 5.3  --   --   --   --   --   HGB 12.6   < > 8.9* 5.6* 5.1* 5.4*  HCT 39.3   < > 28.1* 17.4* 16.2* 17.3*  MCV 92.9   < > 94.0 93.5  --  93.5  PLT 271   < > 255 181  --  189   < > = values in this interval not displayed.   Basic Metabolic Panel: Recent Labs  Lab 01/20/18 1624  01/22/18 0358 01/24/18 0447 01/25/18 0330  NA  --    < > 137 138 140  K  --    < > 3.7 3.8 3.4*  CL  --    < > 105 102 110  CO2  --    < > 27 27 27   GLUCOSE  --    < > 111* 88 86    BUN  --    < > 6 22* 21*  CREATININE  --    < > 0.64 0.59 0.59  CALCIUM  --    < > 8.1* 8.5* 8.0*  MG 2.1  --  2.1 2.0  --   PHOS  --   --   --  2.3*  --    < > = values in this interval not displayed.   GFR: Estimated Creatinine Clearance: 42.1 mL/min (by C-G formula based on SCr of 0.59 mg/dL).  Liver Function Tests: Recent Labs  Lab 01/20/18 1445 01/24/18 0447 01/25/18 0330  AST 17 16 16   ALT 12* 11* 11*  ALKPHOS 36* 25* 36*  BILITOT 0.8 0.3 0.3  PROT 6.4* 5.4* 4.5*  ALBUMIN 3.7 3.1* 2.6*   Recent Labs  Lab 01/20/18 1624  LIPASE 20    Cardiac Enzymes: Recent Labs  Lab 01/20/18 2113 01/21/18 0216 01/21/18 0730  TROPONINI <0.03 <0.03 <0.03  HbA1C: Hgb A1c MFr Bld  Date/Time Value Ref Range Status  09/06/2014 10:42 AM 5.4 <5.7 % Final    Comment:                                                                           According to the ADA Clinical Practice Recommendations for 2011, when HbA1c is used as a screening test:     >=6.5%   Diagnostic of Diabetes Mellitus            (if abnormal result is confirmed)   5.7-6.4%   Increased risk of developing Diabetes Mellitus   References:Diagnosis and Classification of Diabetes Mellitus,Diabetes OVFI,4332,95(JOACZ 1):S62-S69 and Standards of Medical Care in         Diabetes - 2011,Diabetes YSAY,3016,01 (Suppl 1):S11-S61.     05/01/2014 10:50 AM 5.6 <5.7 % Final    Comment:                                                                           According to the ADA Clinical Practice Recommendations for 2011, when HbA1c is used as a screening test:     >=6.5%   Diagnostic of Diabetes Mellitus            (if abnormal result is confirmed)   5.7-6.4%   Increased risk of developing Diabetes Mellitus   References:Diagnosis and Classification of Diabetes Mellitus,Diabetes UXNA,3557,32(KGURK 1):S62-S69 and Standards of Medical Care in         Diabetes - 2011,Diabetes Care,2011,34 (Suppl 1):S11-S61.        Recent Results (from the past 240 hour(s))  Urine culture     Status: None   Collection Time: 01/20/18  4:24 PM  Result Value Ref Range Status   Specimen Description URINE, RANDOM  Final   Special Requests NONE  Final   Culture   Final    NO GROWTH Performed at [facility], [address] [place], [place] [address]    Report Status 01/21/2018 FINAL  Final  MRSA PCR Screening     Status: None   Collection Time: 01/20/18 10:18 PM  Result Value Ref Range Status   MRSA by PCR NEGATIVE NEGATIVE Final    Comment:        The GeneXpert MRSA Assay (FDA approved for NASAL specimens only), is one component of a comprehensive MRSA colonization surveillance program. It is not intended to diagnose MRSA infection nor to guide or monitor treatment for MRSA infections. Performed at [facility], [place] [address], [place], [place] [address]      Scheduled Meds: . atorvastatin  20 mg Oral Daily  . cyanocobalamin  1,000 mcg Subcutaneous Daily  . DULoxetine  60 mg Oral Daily  . ezetimibe  10 mg Oral Daily  . metoprolol tartrate  12.5 mg Oral BID  . mometasone-formoterol  2 puff Inhalation BID  . montelukast  10 mg Oral QHS  . multivitamin with  minerals  1 tablet Oral Daily  . polyethylene glycol  17 g Oral BID  . senna-docusate  1 tablet Oral BID   Continuous Infusions: . sodium chloride 75 mL/hr at 01/25/18 0639  . sodium chloride    . amiodarone 30 mg/hr (01/25/18 0639)     LOS: 5 days   If 7PM-7AM, please contact night-coverage [email or web address] Password [email or web address] 01/25/2018, 3:10 PM

## 2018-01-25 NOTE — Progress Notes (Signed)
PT Cancellation Note  Patient Details Name: [name] MRN: [id number] DOB: 09-06-1932   Cancelled Treatment:    Reason Eval/Treat Not Completed: Medical issues which prohibited therapy(Hgb of 5.6.  Nurse asked PT to [place].  Will return at later date.)   Denice Paradise 01/25/2018, 12:40 PM [facility] [phone] (pager)

## 2018-01-25 NOTE — Consult Note (Signed)
Subjective:   HPI  The patient is an 84 year old female who we are asked to see in GI consultation for severe anemia. She also was found to have heme positive stool. She has multiple medical problems as described below. She was going to have a TEE but this was canceled she was found to be anemic and GI consultation was requested. Patient denies any signs of GI bleeding but as mentioned she was found to have heme positive stool. Her hemoglobin on April 26 was 12.6 today it was found to be 5.4. She denies ever having had an ulcer. She denies ever having had a colonoscopy.  Review of Systems Complains of weakness  Past Medical History:  Diagnosis Date  . Anxiety   . Asthma   . Atrial fibrillation with RVR ([place]) 12/2017  . Breast cancer ([place]) 05/07/1999, 06/2011   a. s/p bilat mastectomies. recurrence 01/2014. Radiation, meds.  . Cancer of chest (wall) ([place])   . Cataract    eye surgery planned for 11/2014  . Chest wall recurrence of breast cancer ([place]) 2016  . Cholelithiasis   . Chronic bronchitis   . COPD (chronic obstructive pulmonary disease) ([place])   . Coronary artery disease Non-obstructive   a. 2012 Cath: LAD 40-30m->Med Rx;  b. 12/2013 [place] MV: EF 74%, no ischemia. c. Cath 10/2014 - 50-70% mLAD but visually felt unchanged from prior, med rx recommended as sx were atypical.  . DDD (degenerative disc disease)   . Degenerative joint disease   . Depression   . Dyslipidemia   . GERD (gastroesophageal reflux disease)   . Hx of radiation therapy 07/02/1999- 08/11/1999   right supraclavucular/axillary region: 5040 cGy, 28 fractions  . Hx of radiation therapy 02/20/14- 03/27/14   right chest wall 4500 cGy 25 sessions  . Hypertension   . Kidney stones    one stones  . Mitral regurgitation    a. mild-mod by echo 12/2017.  . Osteoarthritis   . Osteoporosis   . Pre-diabetes   . PVC's (premature ventricular contractions)   . Reflux esophagitis   . Skin cancer of face    non melanoma  . Spinal  stenosis    Past Surgical History:  Procedure Laterality Date  . ABDOMINAL HYSTERECTOMY     in her 30's  . APPENDECTOMY    . BREAST BIOPSY Right 01/21/2014   Procedure: BREAST/CHEST WALL BIOPSY;  Surgeon: [name], MD;  Location: [facility];  Service: General;  Laterality: Right;  . BREAST LUMPECTOMY  2002   right breast  . CARDIAC CATHETERIZATION  03/2011, 3/15  . LEFT HEART CATHETERIZATION WITH CORONARY ANGIOGRAM N/A 12/05/2013   Procedure: LEFT HEART CATHETERIZATION WITH CORONARY ANGIOGRAM;  Surgeon: [name], MD;  Location: [facility] CATH LAB;  Service: Cardiovascular;  Laterality: N/A;  . LEFT HEART CATHETERIZATION WITH CORONARY ANGIOGRAM N/A 11/06/2014   Procedure: LEFT HEART CATHETERIZATION WITH CORONARY ANGIOGRAM;  Surgeon: [name], MD;  Location: [facility] CATH LAB;  Service: Cardiovascular;  Laterality: N/A;  . MASTECTOMY  07/29/11   bilateral-rt nodes-none on left  . SMALL INTESTINE SURGERY  1998   SBO  . TONSILLECTOMY    . TUBAL LIGATION  1961  . VEIN LIGATION AND STRIPPING Right   . VESICOVAGINAL FISTULA CLOSURE W/ TAH  age 21   Social History   Socioeconomic History  . Marital status: Single    Spouse name: Not on file  . Number of children: 4  . Years of education: Not on file  .  Highest education level: Not on file  Occupational History  . Occupation: Retired    Fish farm manager: OTHER    Comment: Nursing  Social Needs  . Financial resource strain: Not on file  . Food insecurity:    Worry: Not on file    Inability: Not on file  . Transportation needs:    Medical: Not on file    Non-medical: Not on file  Tobacco Use  . Smoking status: Never Smoker  . Smokeless tobacco: Never Used  Substance and Sexual Activity  . Alcohol use: Yes    Comment: occ wine  . Drug use: No  . Sexual activity: Not on file  Lifestyle  . Physical activity:    Days per week: Not on file    Minutes per session: Not on file  . Stress: Not on file  Relationships  . Social  connections:    Talks on phone: Not on file    Gets together: Not on file    Attends religious service: Not on file    Active member of club or organization: Not on file    Attends meetings of clubs or organizations: Not on file    Relationship status: Not on file  . Intimate partner violence:    Fear of current or ex partner: Not on file    Emotionally abused: Not on file    Physically abused: Not on file    Forced sexual activity: Not on file  Other Topics Concern  . Not on file  Social History Narrative  . Not on file   family history includes Asthma in her brother, brother, daughter, and grandchild; Cancer in her mother; Emphysema in her father; Heart disease in her brother and father; Hypertension in her daughter; Lymphoma in her brother; Pancreatic cancer in her mother; Stroke in her daughter.  Current Facility-Administered Medications:  .  0.9 %  sodium chloride infusion, , Intravenous, Continuous, Dorothy Spark, MD, Last Rate: 75 mL/hr at 01/25/18 [phone] .  0.9 %  sodium chloride infusion, , Intravenous, Once, Shahmehdi, Seyed A, MD .  acetaminophen (TYLENOL) tablet 650 mg, 650 mg, Oral, Q4H PRN, Cherene Altes, MD, 650 mg at 01/25/18 1352 .  amiodarone (NEXTERONE PREMIX) 360-4.14 MG/200ML-% (1.8 mg/mL) IV infusion, 30 mg/hr, Intravenous, Continuous, Dunn, Dayna N, PA-C, Last Rate: 16.7 mL/hr at 01/25/18 0639, 30 mg/hr at 01/25/18 0639 .  atorvastatin (LIPITOR) tablet 20 mg, 20 mg, Oral, Daily, Tamala Julian, Rondell A, MD, 20 mg at 01/25/18 0842 .  cyanocobalamin ((VITAMIN B-12)) injection 1,000 mcg, 1,000 mcg, Subcutaneous, Daily, Cherene Altes, MD, 1,000 mcg at 01/24/18 1241 .  diazepam (VALIUM) tablet 5 mg, 5 mg, Oral, QHS PRN, Fuller Plan A, MD, 5 mg at 01/24/18 2015 .  DULoxetine (CYMBALTA) DR capsule 60 mg, 60 mg, Oral, Daily, Florencia Reasons, MD, 60 mg at 01/25/18 0842 .  ezetimibe (ZETIA) tablet 10 mg, 10 mg, Oral, Daily, Tamala Julian, Rondell A, MD, 10 mg at 01/25/18 0842 .   gi cocktail (Maalox,Lidocaine,Donnatal), 30 mL, Oral, QID PRN, Fuller Plan A, MD, 30 mL at 01/21/18 1207 .  hydroxypropyl methylcellulose / hypromellose (ISOPTO TEARS / GONIOVISC) 2.5 % ophthalmic solution 1 drop, 1 drop, Both Eyes, TID PRN, Florencia Reasons, MD .  lactulose (CHRONULAC) 10 GM/15ML solution 30 g, 30 g, Oral, BID PRN, Cherene Altes, MD, 30 g at 01/24/18 1110 .  levalbuterol (XOPENEX) nebulizer solution 0.63 mg, 0.63 mg, Nebulization, Q3H PRN, Cherene Altes, MD .  magnesium citrate solution 1  Bottle, 1 Bottle, Oral, Daily PRN, Joette Catching T, MD .  metoprolol tartrate (LOPRESSOR) tablet 12.5 mg, 12.5 mg, Oral, BID, Dorothy Spark, MD, Stopped at 01/25/18 0845 .  mometasone-formoterol (DULERA) 200-5 MCG/ACT inhaler 2 puff, 2 puff, Inhalation, BID, Fuller Plan A, MD, 2 puff at 01/25/18 0851 .  montelukast (SINGULAIR) tablet 10 mg, 10 mg, Oral, QHS, Smith, Rondell A, MD, 10 mg at 01/24/18 2015 .  morphine 2 MG/ML injection 2 mg, 2 mg, Intravenous, Q2H PRN, Cherene Altes, MD .  multivitamin with minerals tablet 1 tablet, 1 tablet, Oral, Daily, Cherene Altes, MD, 1 tablet at 01/25/18 [phone] .  ondansetron (ZOFRAN) injection 4 mg, 4 mg, Intravenous, Q6H PRN, Tamala Julian, Rondell A, MD, 4 mg at 01/24/18 1109 .  oxyCODONE-acetaminophen (PERCOCET/ROXICET) 5-325 MG per tablet 1 tablet, 1 tablet, Oral, Q4H PRN, Florencia Reasons, MD, 1 tablet at 01/25/18 1232 .  polyethylene glycol (MIRALAX / GLYCOLAX) packet 17 g, 17 g, Oral, BID, Cherene Altes, MD, 17 g at 01/24/18 7858 .  senna-docusate (Senokot-S) tablet 1 tablet, 1 tablet, Oral, BID, Florencia Reasons, MD, 1 tablet at 01/24/18 2015 .  sodium phosphate (FLEET) 7-19 GM/118ML enema 1 enema, 1 enema, Rectal, Daily PRN, Joette Catching T, MD .  sorbitol 70 % solution 30 mL, 30 mL, Oral, Daily PRN, Cherene Altes, MD, 30 mL at 01/24/18 1111 Allergies  Allergen Reactions  . Adhesive [Tape] Other (See Comments)    Pt prefers to use paper  tape  . Gabapentin Other (See Comments)    confusion  . Penicillins Rash    Has patient had a PCN reaction causing immediate rash, facial/tongue/throat swelling, SOB or lightheadedness with hypotension: Yes Has patient had a PCN reaction causing severe rash involving mucus membranes or skin necrosis: No Has patient had a PCN reaction that required hospitalization No Has patient had a PCN reaction occurring within the last 10 years: No If all of the above answers are "NO", then may proceed with Cephalosporin use.      Objective:     BP (!) 101/43   Pulse 79   Temp 98.8 F (37.1 C) (Oral)   Resp 16   Ht 5\' 2"  (1.575 m)   Wt 53.8 kg (118 lb 9.6 oz)   SpO2 97%   BMI 21.69 kg/m   No distress  Heart regular rhythm  Lungs clear  Abdomen soft and nontender  Laboratory No components found for: D1    Assessment:     Anemia  Heme-positive stool  Multiple medical problems as mentioned above.      Plan:     The plan for today will be to transfuse this patient packed red cells. I did mention to her about doing EGD and colonoscopy but I don't think she could tolerate any kind of prep today given her hemoglobin of 5.4. There does not appear to be any clinical evidence of active GI bleeding. When she has a little more stable and stronger we can consider prep. Lab Results  Component Value Date   HGB 5.4 (LL) 01/25/2018   HGB 5.1 (LL) 01/25/2018   HGB 5.6 (LL) 01/25/2018   HGB 13.6 09/22/2017   HGB 12.8 08/24/2017   HGB 12.2 07/27/2017   HCT 17.3 (L) 01/25/2018   HCT 16.2 (L) 01/25/2018   HCT 17.4 (L) 01/25/2018   HCT 41.6 09/22/2017   HCT 38.8 08/24/2017   HCT 37.7 07/27/2017   ALKPHOS 36 (L) 01/25/2018   ALKPHOS 25 (L)  01/24/2018   ALKPHOS 36 (L) 01/20/2018   ALKPHOS 34 (L) 09/22/2017   ALKPHOS 39 (L) 08/24/2017   ALKPHOS 32 (L) 07/27/2017   AST 16 01/25/2018   AST 16 01/24/2018   AST 17 01/20/2018   AST 19 09/22/2017   AST 18 08/24/2017   AST 15 07/27/2017    ALT 11 (L) 01/25/2018   ALT 11 (L) 01/24/2018   ALT 12 (L) 01/20/2018   ALT 11 09/22/2017   ALT 9 08/24/2017   ALT <6 07/27/2017

## 2018-01-25 NOTE — Progress Notes (Addendum)
Progress Note  Patient Name: Angel [name] Date of Encounter: 01/25/2018  Primary Cardiologist: Fransico Him, MD   Subjective   Denies any discomfort.   Inpatient Medications    Scheduled Meds: . atorvastatin  20 mg Oral Daily  . cyanocobalamin  1,000 mcg Subcutaneous Daily  . DULoxetine  60 mg Oral Daily  . ezetimibe  10 mg Oral Daily  . metoprolol tartrate  12.5 mg Oral BID  . mometasone-formoterol  2 puff Inhalation BID  . montelukast  10 mg Oral QHS  . multivitamin with minerals  1 tablet Oral Daily  . polyethylene glycol  17 g Oral BID  . senna-docusate  1 tablet Oral BID   Continuous Infusions: . sodium chloride 75 mL/hr at 01/25/18 0639  . sodium chloride    . amiodarone 30 mg/hr (01/25/18 0639)   PRN Meds: acetaminophen, diazepam, gi cocktail, hydroxypropyl methylcellulose / hypromellose, lactulose, levalbuterol, magnesium citrate, morphine injection, ondansetron (ZOFRAN) IV, oxyCODONE-acetaminophen, sodium phosphate, sorbitol   Vital Signs    Vitals:   01/24/18 2024 01/25/18 0029 01/25/18 0432 01/25/18 0805  BP: (!) 119/54 (!) 103/45 (!) 102/47 (!) 110/99  Pulse: 84 76 73 77  Resp: 16 16 16    Temp: 98.3 F (36.8 C) 98.1 F (36.7 C) 99.5 F (37.5 C) 98.7 F (37.1 C)  TempSrc: Oral Oral Oral Oral  SpO2:  100% 98% 91%  Weight:   118 lb 9.6 oz (53.8 kg)   Height:        Intake/Output Summary (Last 24 hours) at 01/25/2018 0852 Last data filed at 01/25/2018 0947 Gross per 24 hour  Intake 3108.67 ml  Output 350 ml  Net 2758.67 ml   Filed Weights   01/23/18 0900 01/24/18 0526 01/25/18 0432  Weight: 120 lb 14.4 oz (54.8 kg) 119 lb 11.2 oz (54.3 kg) 118 lb 9.6 oz (53.8 kg)    Telemetry    NSR with controlled HR, converted to sinus rhythm yesterday afternoon - Personally Reviewed  ECG    NSR without significant ST-T wave changes on EKG 01/24/2018 - Personally Reviewed  Physical Exam   GEN: No acute distress.   Neck: No JVD Cardiac: RRR, no  murmurs, rubs, or gallops.  Respiratory: Clear to auscultation bilaterally. GI: Soft, nontender, non-distended  MS: No edema; No deformity. Neuro:  Nonfocal  Psych: Normal affect   Labs    Chemistry Recent Labs  Lab 01/20/18 1445  01/22/18 0358 01/24/18 0447 01/25/18 0330  NA 136   < > 137 138 140  K 3.9   < > 3.7 3.8 3.4*  CL 102   < > 105 102 110  CO2 23   < > 27 27 27   GLUCOSE 96   < > 111* 88 86  BUN 5*   < > 6 22* 21*  CREATININE 0.71   < > 0.64 0.59 0.59  CALCIUM 9.1   < > 8.1* 8.5* 8.0*  PROT 6.4*  --   --  5.4* 4.5*  ALBUMIN 3.7  --   --  3.1* 2.6*  AST 17  --   --  16 16  ALT 12*  --   --  11* 11*  ALKPHOS 36*  --   --  25* 36*  BILITOT 0.8  --   --  0.3 0.3  GFRNONAA >60   < > >60 >60 >60  GFRAA >60   < > >60 >60 >60  ANIONGAP 11   < > 5 9 3*   < > =  values in this interval not displayed.     Hematology Recent Labs  Lab 01/23/18 0520 01/24/18 0447 01/25/18 0330 01/25/18 0605  WBC 4.7 7.9 6.3  --   RBC 3.02* 2.99*  2.99* 1.86*  --   HGB 9.0* 8.9* 5.6* 5.1*  HCT 28.5* 28.1* 17.4* 16.2*  MCV 94.4 94.0 93.5  --   MCH 29.8 29.8 30.1  --   MCHC 31.6 31.7 32.2  --   RDW 13.6 13.5 13.8  --   PLT 207 255 181  --     Cardiac Enzymes Recent Labs  Lab 01/20/18 2113 01/21/18 0216 01/21/18 0730  TROPONINI <0.03 <0.03 <0.03    Recent Labs  Lab 01/20/18 1454  TROPIPOC 0.00     BNP Recent Labs  Lab 01/20/18 1624  BNP 455.0*     DDimer No results for input(s): DDIMER in the last 168 hours.   Radiology    No results found.  Cardiac Studies   2D Echo 01/21/18 Study Conclusions  - Left ventricle: The cavity size was normal. Systolic function was normal. The estimated ejection fraction was in the range of 60% to 65%. Wall motion was normal; there were no regional wall motion abnormalities. The study was not technically sufficient to allow evaluation of LV diastolic dysfunction due to atrial fibrillation. - Aortic valve:  Trileaflet; mildly thickened, mildly calcified leaflets. Sclerosis without stenosis. - Mitral valve: There was mild to moderate regurgitation. - Left atrium: The atrium was normal in size. - Right ventricle: Systolic function was normal. - Tricuspid valve: There was mild regurgitation. - Pulmonic valve: There was mild regurgitation. - Pulmonary arteries: Systolic pressure was within the normal range. - Inferior vena cava: The vessel was normal in size. - Pericardium, extracardiac: There was no pericardial effusion.  Impressions:  - When compared to the prior study from 08/20/2015 LVEF has improved from 50% to 60-65%.    Patient Profile     84 y.o. female with a hx of non-obstructive CAD (50-70% mLAD by last cath 10/2014),mild-mod MR by echo this admission,DDD, dyslipidemia, breast CA (tx with mastectomy, radiation, meds - followed by onc), remote asthma, GERD, reflux esophagitis, PVCs,pre-DM, anemia,and depressionwho is being followed for the evaluation of atrial fibat the request of Dr. Erlinda Hong    Assessment & Plan    1. Newly atrial fibrillation: scheduled to undergo TEE DCCV yesterday, but per nurse, patient ate, also EKG shows she converted to sinus rhythm. On IV heparin, IV heparin stopped this morning due to worsening anemia. Not currently a candidate for systemic anticoagulation given severe anemia. Continue metoprolol for as long as SBP > 105.   2. Severe anemia: hemoglobin 12 --> 9--> 5.6 --> 5.1. Positive hemoccult. Need GI eval. Nurse and patient did not see any blood in the stool, but clearly bleeding  3. CAD: moderate nonobstructive CAD on cath 10/2014 with 50-70% mLAD lesion. No chest pain today  4. HLD: LDL 101, goal < 70, some concern regarding cognitive effects with statin  For questions or updates, please contact [place] Please consult [email or web address] for contact info under Cardiology/STEMI.     Hilbert Corrigan, PA  01/25/2018, 8:52 AM     The  patient was seen, examined and discussed with Almyra Deforest, PA-C and I agree with the above.   TEE/DCCV cancelled as she ate yesterday, she cardioverted spontaneously overnight, now in SR. Hg dropped dramatically 9.0 --> 5.6 -->5.1, heparin is held, she needs transfusions and urgent GI evaluation. I would continue  amiodarone to maintain SR. BP normal.  Ena Dawley, MD 01/25/2018

## 2018-01-26 DIAGNOSIS — D62 Acute posthemorrhagic anemia: Secondary | ICD-10-CM

## 2018-01-26 LAB — CBC
HEMATOCRIT: 30.9 % — AB (ref 36.0–46.0)
HEMOGLOBIN: 10.3 g/dL — AB (ref 12.0–15.0)
MCH: 29.1 pg (ref 26.0–34.0)
MCHC: 33.3 g/dL (ref 30.0–36.0)
MCV: 87.3 fL (ref 78.0–100.0)
Platelets: 153 10*3/uL (ref 150–400)
RBC: 3.54 MIL/uL — AB (ref 3.87–5.11)
RDW: 15.4 % (ref 11.5–15.5)
WBC: 6.2 10*3/uL (ref 4.0–10.5)

## 2018-01-26 LAB — HEMOGLOBIN AND HEMATOCRIT, BLOOD
HEMATOCRIT: 29.6 % — AB (ref 36.0–46.0)
Hemoglobin: 9.9 g/dL — ABNORMAL LOW (ref 12.0–15.0)

## 2018-01-26 MED ORDER — DIPHENHYDRAMINE HCL 50 MG/ML IJ SOLN
12.5000 mg | Freq: Four times a day (QID) | INTRAMUSCULAR | Status: AC | PRN
  Administered 2018-01-26 – 2018-01-27 (×2): 12.5 mg via INTRAVENOUS
  Filled 2018-01-26 (×2): qty 1

## 2018-01-26 MED ORDER — ALPRAZOLAM 0.25 MG PO TABS
0.2500 mg | ORAL_TABLET | Freq: Once | ORAL | Status: AC
  Administered 2018-01-26: 0.25 mg via ORAL
  Filled 2018-01-26: qty 1

## 2018-01-26 MED ORDER — PEG 3350-KCL-NA BICARB-NACL 420 G PO SOLR
4000.0000 mL | Freq: Once | ORAL | Status: AC
  Administered 2018-01-26: 4000 mL via ORAL
  Filled 2018-01-26: qty 4000

## 2018-01-26 NOTE — Progress Notes (Signed)
[facility] TEAM 1 - Stepdown/ICU TEAM  Angel [name]  ZCH:[id number] DOB: 1932-06-18 DOA: 01/20/2018 PCP: Ann Held, DO    Brief Narrative:  84yo F w/ a hx of HTN, dyslipidemia, CAD, PVCs, GERD, and breast cancer s/p B mastectomies with chemo/radiation who presented with chest pain.  She normally experiences a burning pain across her chest that makes it hard for her to breathe when she exerts herself.  This pain has been present since she was diagnosed with breast cancer and had a double mastectomy with radiation.  However, over the 1-2 days prior to this admit her symptoms became constant and present even at rest.   In the ED she was noted to be in A. fib with RVR with heart rates into the 150-160s.  She was noted to be tachypneic, but O2 saturations were maintained on room air.   _________________________________________________________________________________ Significant Events: 4/26 admit  4/27 -TTE - 60-65% - no WMA  5/1 - hemoglobin was 5.1 - S/P Total of 4 U Of PRBC tx -->9.9 -----------------------------------------------------------------------------------------------------------------------------------  Subjective:  Patient was seen and examined this morning, in good spirits, tolerated blood transfusion well. Currently her blood pressure and heart rate has improved.  Reporting of no active rectal bleed this morning.  Or overnight.  Patient was seen and evaluated by gastroneurologist yesterday, continue to watch her H&H She is told she will be prepped for EGD and colonoscopy in a.m.  Currently stable denies of any headaches visual changes, dizziness.  Denies any chest pain or shortness of breath. Currently stable  Assessment & Plan:  Acute GI bleed, exacerbated possibly by heparin drip, severe hemorrhagic anemia secondary to GI bleed -Hemoglobin 12.0->9->5.6-->5.1 --->S/P Total of 4 U Of PRBC tx -->9.9  -GI consulted, and following anticipating colon prep  starting tonight, planning for EGD /colonoscopy in a.m.  -DC heparin drip GI bleed.   Newly diagnosed Afib w/ RVR -  CHA2DS2 VASc score is 5  - Card. following, patient is obviously not a candidate for chronic anticoagulation due to his known dementia and GI bleed Artisan was changed to amiodarone, beta-blocker was added -Patient was scheduled to go on Dr. TEE DCCV, she converted to normal sinus rhythm on 430, -Cardiology following closely -Continue amiodarone  Coronary artery disease-moderate nonobstructive coronary artery disease on cath 11/16/2014 with 50 to 70% stenosis of LAD -Continue medical management per record, beta-blocker, statins  Hypotension w/ hx of HTN BP well controlled at this time, w/ episodes of modest hypotension   Normocytic Anemia -exacerbated by hemorrhagic anemia -H&H now stable status post transfusion of 4 units of packed red blood cell Hgb was 11.9 01/11/18 - anemia panel c/w Fe deficiency -  -Gastroneurology team following, planning for EGD and colonoscopy  ?Cognitive deficits - ?Dementia Per RN "Pt appears to be forgetful and at times cannot remember that she's gotten an extra dose of xanax or received laxative and prune juice." - TSH normal -  B12 marginal therefore will replace - folic acid normal - obtain CT head once bowel issues stabilized to r/o acute findings and eval for extent of atrophy - check RPR and ammonia level for completeness   HLD Continue usual home medical therapy  COPD Well compensated  Breast cancer s/p bilateral mastectomy Chest wall recurrence s/p resection in 2015 - no recurrent of disease since 2015 - maintained on fulvestrant q month - also receives denosumab q61months  Chronic pain followed by Dr Nelva Bush - she reported Dr Nelva Bush recently increased her  pain meds to q6hrs prn - Dr Jana Hakim also recently prescribed valium - pain appears well controlled at this time   DVT prophylaxis: IV heparin  Code Status: FULL  CODE Family Communication: no family present at time of exam  Disposition Plan: PT/OT - cognitive eval - anemia eval - DCCV pending   Consultants:  Cardiology   Antimicrobials:  none  Objective: Blood pressure 137/77, pulse 71, temperature 98.5 F (36.9 C), temperature source Oral, resp. rate 18, height 5\' 2"  (1.575 m), weight 55 kg (121 lb 3.2 oz), SpO2 95 %.  Intake/Output Summary (Last 24 hours) at 01/26/2018 1418 Last data filed at 01/26/2018 1328 Gross per 24 hour  Intake 2756.57 ml  Output 650 ml  Net 2106.57 ml   Filed Weights   01/24/18 0526 01/25/18 0432 01/26/18 0345  Weight: 54.3 kg (119 lb 11.2 oz) 53.8 kg (118 lb 9.6 oz) 55 kg (121 lb 3.2 oz)    Examination: General: No acute respiratory distress - alert and conversant  Lungs: Clear to auscultation B - no wheezing  Cardiovascular: irreg irreg - rate variable - no M or rub  Abdomen: less tender, soft, no rebound, no appreciable masses, bowel sounds positive Extremities: trace edema bilateral lower extremities  CBC: Recent Labs  Lab 01/20/18 1445  01/25/18 0330  01/25/18 0903 01/25/18 1849 01/26/18 0408 01/26/18 0638  WBC 7.3   < > 6.3  --  5.8  --  6.2  --   NEUTROABS 5.3  --   --   --   --   --   --   --   HGB 12.6   < > 5.6*   < > 5.4* 7.8* 10.3* 9.9*  HCT 39.3   < > 17.4*   < > 17.3* 23.7* 30.9* 29.6*  MCV 92.9   < > 93.5  --  93.5  --  87.3  --   PLT 271   < > 181  --  189  --  153  --    < > = values in this interval not displayed.   Basic Metabolic Panel: Recent Labs  Lab 01/20/18 1624  01/22/18 0358 01/24/18 0447 01/25/18 0330  NA  --    < > 137 138 140  K  --    < > 3.7 3.8 3.4*  CL  --    < > 105 102 110  CO2  --    < > 27 27 27   GLUCOSE  --    < > 111* 88 86  BUN  --    < > 6 22* 21*  CREATININE  --    < > 0.64 0.59 0.59  CALCIUM  --    < > 8.1* 8.5* 8.0*  MG 2.1  --  2.1 2.0  --   PHOS  --   --   --  2.3*  --    < > = values in this interval not displayed.   GFR: Estimated  Creatinine Clearance: 42.1 mL/min (by C-G formula based on SCr of 0.59 mg/dL).  Liver Function Tests: Recent Labs  Lab 01/20/18 1445 01/24/18 0447 01/25/18 0330  AST 17 16 16   ALT 12* 11* 11*  ALKPHOS 36* 25* 36*  BILITOT 0.8 0.3 0.3  PROT 6.4* 5.4* 4.5*  ALBUMIN 3.7 3.1* 2.6*   Recent Labs  Lab 01/20/18 1624  LIPASE 20    Cardiac Enzymes: Recent Labs  Lab 01/20/18 2113 01/21/18 0216 01/21/18 0730  TROPONINI <0.03 <0.03 <  0.03    HbA1C: Hgb A1c MFr Bld  Date/Time Value Ref Range Status  09/06/2014 10:42 AM 5.4 <5.7 % Final    Comment:                                                                           According to the ADA Clinical Practice Recommendations for 2011, when HbA1c is used as a screening test:     >=6.5%   Diagnostic of Diabetes Mellitus            (if abnormal result is confirmed)   5.7-6.4%   Increased risk of developing Diabetes Mellitus   References:Diagnosis and Classification of Diabetes Mellitus,Diabetes QJJH,4174,08(XKGYJ 1):S62-S69 and Standards of Medical Care in         Diabetes - 2011,Diabetes EHUD,1497,02 (Suppl 1):S11-S61.     05/01/2014 10:50 AM 5.6 <5.7 % Final    Comment:                                                                           According to the ADA Clinical Practice Recommendations for 2011, when HbA1c is used as a screening test:     >=6.5%   Diagnostic of Diabetes Mellitus            (if abnormal result is confirmed)   5.7-6.4%   Increased risk of developing Diabetes Mellitus   References:Diagnosis and Classification of Diabetes Mellitus,Diabetes OVZC,5885,02(DXAJO 1):S62-S69 and Standards of Medical Care in         Diabetes - 2011,Diabetes Care,2011,34 (Suppl 1):S11-S61.       Recent Results (from the past 240 hour(s))  Urine culture     Status: None   Collection Time: 01/20/18  4:24 PM  Result Value Ref Range Status   Specimen Description URINE, RANDOM  Final   Special Requests NONE  Final    Culture   Final    NO GROWTH Performed at [facility], [address] [place], [place] [address]    Report Status 01/21/2018 FINAL  Final  MRSA PCR Screening     Status: None   Collection Time: 01/20/18 10:18 PM  Result Value Ref Range Status   MRSA by PCR NEGATIVE NEGATIVE Final    Comment:        The GeneXpert MRSA Assay (FDA approved for NASAL specimens only), is one component of a comprehensive MRSA colonization surveillance program. It is not intended to diagnose MRSA infection nor to guide or monitor treatment for MRSA infections. Performed at [facility], [address] [place], [place] [address]      Scheduled Meds: . atorvastatin  20 mg Oral Daily  . cyanocobalamin  1,000 mcg Subcutaneous Daily  . DULoxetine  60 mg Oral Daily  . ezetimibe  10 mg Oral Daily  . metoprolol tartrate  12.5 mg Oral BID  . mometasone-formoterol  2 puff Inhalation BID  . montelukast  10 mg Oral QHS  .  multivitamin with minerals  1 tablet Oral Daily  . polyethylene glycol  17 g Oral BID  . polyethylene glycol-electrolytes  4,000 mL Oral Once  . senna-docusate  1 tablet Oral BID   Continuous Infusions: . sodium chloride Stopped (01/26/18 0205)  . sodium chloride    . amiodarone 30 mg/hr (01/26/18 0613)     LOS: 6 days   If 7PM-7AM, please contact night-coverage [email or web address] Password [email or web address] 01/26/2018, 2:18 PM

## 2018-01-26 NOTE — Plan of Care (Signed)
Consent signed for colonoscopy/EGD,  compliant with bowel prep, educated on need for bowel prep, verbalizes understanding, compliant with use of call light to request assisstance.  Edward Qualia RN

## 2018-01-26 NOTE — Progress Notes (Addendum)
Progress Note  Patient Name: Angel [name] Date of Encounter: 01/26/2018  Primary Cardiologist: Fransico Him, MD   Subjective   Denies any CP or SOB  Inpatient Medications    Scheduled Meds: . atorvastatin  20 mg Oral Daily  . cyanocobalamin  1,000 mcg Subcutaneous Daily  . DULoxetine  60 mg Oral Daily  . ezetimibe  10 mg Oral Daily  . metoprolol tartrate  12.5 mg Oral BID  . mometasone-formoterol  2 puff Inhalation BID  . montelukast  10 mg Oral QHS  . multivitamin with minerals  1 tablet Oral Daily  . polyethylene glycol  17 g Oral BID  . senna-docusate  1 tablet Oral BID   Continuous Infusions: . sodium chloride Stopped (01/26/18 0205)  . sodium chloride    . amiodarone 30 mg/hr (01/26/18 8841)   PRN Meds: acetaminophen, diazepam, gi cocktail, hydroxypropyl methylcellulose / hypromellose, lactulose, levalbuterol, magnesium citrate, morphine injection, ondansetron (ZOFRAN) IV, oxyCODONE-acetaminophen, sodium phosphate, sorbitol   Vital Signs    Vitals:   01/26/18 0132 01/26/18 0345 01/26/18 0719 01/26/18 0953  BP: 129/69 113/84  106/71  Pulse: 70 70 70   Resp: 18 18 18    Temp: 98.4 F (36.9 C) 98.9 F (37.2 C)  97.7 F (36.5 C)  TempSrc: Oral Oral  Oral  SpO2: 98% 95% 97% 95%  Weight:  121 lb 3.2 oz (55 kg)    Height:        Intake/Output Summary (Last 24 hours) at 01/26/2018 0959 Last data filed at 01/26/2018 0753 Gross per 24 hour  Intake 2957.24 ml  Output 351 ml  Net 2606.24 ml   Filed Weights   01/24/18 0526 01/25/18 0432 01/26/18 0345  Weight: 119 lb 11.2 oz (54.3 kg) 118 lb 9.6 oz (53.8 kg) 121 lb 3.2 oz (55 kg)    Telemetry    NSR without recurrent afib - Personally Reviewed  ECG    No new EKG - Personally Reviewed  Physical Exam   GEN: No acute distress.   Neck: No JVD Cardiac: RRR, no murmurs, rubs, or gallops.  Respiratory: Clear to auscultation bilaterally. GI: Soft, nontender, non-distended  MS: No edema; No  deformity. Neuro:  Nonfocal  Psych: Normal affect   Labs    Chemistry Recent Labs  Lab 01/20/18 1445  01/22/18 0358 01/24/18 0447 01/25/18 0330  NA 136   < > 137 138 140  K 3.9   < > 3.7 3.8 3.4*  CL 102   < > 105 102 110  CO2 23   < > 27 27 27   GLUCOSE 96   < > 111* 88 86  BUN 5*   < > 6 22* 21*  CREATININE 0.71   < > 0.64 0.59 0.59  CALCIUM 9.1   < > 8.1* 8.5* 8.0*  PROT 6.4*  --   --  5.4* 4.5*  ALBUMIN 3.7  --   --  3.1* 2.6*  AST 17  --   --  16 16  ALT 12*  --   --  11* 11*  ALKPHOS 36*  --   --  25* 36*  BILITOT 0.8  --   --  0.3 0.3  GFRNONAA >60   < > >60 >60 >60  GFRAA >60   < > >60 >60 >60  ANIONGAP 11   < > 5 9 3*   < > = values in this interval not displayed.     Hematology Recent Labs  Lab 01/25/18  0330  01/25/18 0903 01/25/18 1849 01/26/18 0408 01/26/18 0638  WBC 6.3  --  5.8  --  6.2  --   RBC 1.86*  --  1.85*  --  3.54*  --   HGB 5.6*   < > 5.4* 7.8* 10.3* 9.9*  HCT 17.4*   < > 17.3* 23.7* 30.9* 29.6*  MCV 93.5  --  93.5  --  87.3  --   MCH 30.1  --  29.2  --  29.1  --   MCHC 32.2  --  31.2  --  33.3  --   RDW 13.8  --  13.7  --  15.4  --   PLT 181  --  189  --  153  --    < > = values in this interval not displayed.    Cardiac Enzymes Recent Labs  Lab 01/20/18 2113 01/21/18 0216 01/21/18 0730  TROPONINI <0.03 <0.03 <0.03    Recent Labs  Lab 01/20/18 1454  TROPIPOC 0.00     BNP Recent Labs  Lab 01/20/18 1624  BNP 455.0*     DDimer No results for input(s): DDIMER in the last 168 hours.   Radiology    No results found.  Cardiac Studies   2D Echo 01/21/18 Study Conclusions  - Left ventricle: The cavity size was normal. Systolic function was normal. The estimated ejection fraction was in the range of 60% to 65%. Wall motion was normal; there were no regional wall motion abnormalities. The study was not technically sufficient to allow evaluation of LV diastolic dysfunction due to atrial fibrillation. -  Aortic valve: Trileaflet; mildly thickened, mildly calcified leaflets. Sclerosis without stenosis. - Mitral valve: There was mild to moderate regurgitation. - Left atrium: The atrium was normal in size. - Right ventricle: Systolic function was normal. - Tricuspid valve: There was mild regurgitation. - Pulmonic valve: There was mild regurgitation. - Pulmonary arteries: Systolic pressure was within the normal range. - Inferior vena cava: The vessel was normal in size. - Pericardium, extracardiac: There was no pericardial effusion.  Impressions:  - When compared to the prior study from 08/20/2015 LVEF has improved from 50% to 60-65%.    Patient Profile     84 y.o. female with a hx of non-obstructive CAD (50-70% mLAD by last cath 10/2014),mild-mod MR by echo this admission,DDD, dyslipidemia, breast CA (tx with mastectomy, radiation, meds - followed by onc), remote asthma, GERD, reflux esophagitis, PVCs,pre-DM, anemia,and depressionwho is being followedfor the evaluation of atrial fibat the request of Dr. Erlinda Hong  Assessment & Plan    1. Newly atrial fibrillation: scheduled to undergo TEE DCCV 4/30, but she converted to sinus rhythm on 4/30. On IV heparin, IV heparin stopped 01/25/2018 due to worsening anemia. Not currently a candidate for systemic anticoagulation given severe anemia. Continue metoprolol for as long as SBP > 105. Off ASA. Continue amiodarone through upcoming colonoscopy/EGD  2. Severe anemia: hemoglobin 12 --> 9--> 5.6 --> 5.1 --> 7.8 --> 10.3 --> 9.9. Positive hemoccult. Nurse and patient did not see any blood in the stool, but clearly bleeding  - hemoglobin improved, pending GI prep today and EGD/Colonoscopy tomorrow  3. CAD: moderate nonobstructive CAD on cath 10/2014 with 50-70% mLAD lesion. No chest pain today  4. HLD: LDL 101, goal < 70, some concern regarding cognitive effects with statin  For questions or updates, please contact [place] Please  consult [email or web address] for contact info under Cardiology/STEMI.     Signed, Almyra Deforest, PA  01/26/2018, 9:59 AM    The patient was seen, examined and discussed with Almyra Deforest, PA-C and I agree with the above.   84 year old female  With newly diagnosed a-fib, she cardioverted spontaneously on 4/30 and remain in SR.  Hg dropped dramatically 9.0 --> 5.6 -->5.1, positive hemoccult, heparin was held, she received transfusions and is awaiting EGD tomorrow, we will continue amiodarone drip, switch to PO post anesthesia tomorroow if she remains in SR. Continue low dose metoprolol, BP normal. Hb 10.3 today, she is not a candidate for long term anticoagulation.   Ena Dawley, MD 01/26/2018

## 2018-01-26 NOTE — Care Management Note (Addendum)
Case Management Note  Patient Details  Name: Angel [name] MRN: [id number] Date of Birth: September 14, 1932  Subjective/Objective: Pt presented for Chest Pain, N/V and fatigue. Positive for Atrial Fib and initially on IV Amio gtt. Hgb down to 5.6 on 01-25-18 and transfused 4 units PRBC. HGB within normal limits 5-2-1. PTA from home alone. Pt states she has DME Cane and RW at home. CM did get permission to call daughter and she was at a MD appointment. Per pt her daughter will stay with her once she is admitted to home. PT to re-evalute to see if plan will continue to be for home.                Action/Plan: CM will await call from daughter to discuss disposition plans. [facility] referral made- pt is agreeable to services. No further needs from CM at this time.   Expected Discharge Date:                  Expected Discharge Plan:  [place]  In-House Referral:  [facility]  Discharge planning Services  CM Consult  Post Acute Care Choice:  Home Health, Durable Medical Equipment Choice offered to:  Patient, Adult Children  DME Arranged:   3n1 DME Agency:   [facility]  HH Arranged:  PT,RN, Nurses Aide, Social Worker [place] Agency:   [facility]  Status of Service:  In process, will continue to follow  If discussed at Long Length of Stay Meetings, dates discussed:    Additional Comments: 1600 01-27-18 Jacqlyn Krauss, RN, BSN [phone]  CM did speak with patient and she is agreeable to SNF. CSW aware- pt plan for transition to facility on Sat. CM will continue to monitor.    1306 01-27-18 Jacqlyn Krauss, RN,BSN [phone] CM was able to speak with daughter [name] in regards to disposition needs. CM did discuss that pt will need 24 hour supervision. Daughter is aware and she is usually the one that stays with mother. CM did provide agency of choice via phone- Daughter chose [facility] for [place]. CM did ask daughter if she felt that pt needed SNF once stable-  Daughter wants to discuss with CSW in regards to SNF options. CM did make daughter and CSW aware that pt will probably be d/c on Sat per speaking with MD earlier this am. CM will make referral with [facility] for services listed above. If pt is agreeable to SNF- Weekend Case Manager to cancel [facility] services with [facility]. If pt goes home pt will need [facility] /DME 3n1 orders.  No further needs at this time.  Bethena Roys, RN 01/26/2018, 2:30 PM

## 2018-01-26 NOTE — Progress Notes (Signed)
[facility] Progress Note  Subjective: The patient's hemoglobin has improved to 9.9 after transfusion. We discussed further evaluation of the GI tract including EGD and colonoscopy today. She is agreeable.  Objective: Vital signs in last 24 hours: Temp:  [98.2 F (36.8 C)-99.1 F (37.3 C)] 98.9 F (37.2 C) (05/02 0345) Pulse Rate:  [68-84] 70 (05/02 0719) Resp:  [16-19] 18 (05/02 0719) BP: (101-129)/(43-88) 113/84 (05/02 0345) SpO2:  [91 %-100 %] 97 % (05/02 0719) Weight:  [55 kg (121 lb 3.2 oz)] 55 kg (121 lb 3.2 oz) (05/02 0345) Weight change: 1.179 kg (2 lb 9.6 oz)   PE:  No distress  Heart regular rhythm  Abdomen soft nontender  Lab Results: Results for orders placed or performed during the hospital encounter of 01/20/18 (from the past 24 hour(s))  Hemoglobin and hematocrit, blood     Status: Abnormal   Collection Time: 01/25/18  6:49 PM  Result Value Ref Range   Hemoglobin 7.8 (L) 12.0 - 15.0 g/dL   HCT 23.7 (L) 36.0 - 46.0 %  CBC     Status: Abnormal   Collection Time: 01/26/18  4:08 AM  Result Value Ref Range   WBC 6.2 4.0 - 10.5 K/uL   RBC 3.54 (L) 3.87 - 5.11 MIL/uL   Hemoglobin 10.3 (L) 12.0 - 15.0 g/dL   HCT 30.9 (L) 36.0 - 46.0 %   MCV 87.3 78.0 - 100.0 fL   MCH 29.1 26.0 - 34.0 pg   MCHC 33.3 30.0 - 36.0 g/dL   RDW 15.4 11.5 - 15.5 %   Platelets 153 150 - 400 K/uL  Hemoglobin and hematocrit, blood     Status: Abnormal   Collection Time: 01/26/18  6:38 AM  Result Value Ref Range   Hemoglobin 9.9 (L) 12.0 - 15.0 g/dL   HCT 29.6 (L) 36.0 - 46.0 %    Studies/Results: No results found.    Assessment: Anemia  Heme-positive stool    Plan:   We will proceed with EGD and colonoscopy tomorrow. She will be prepped today.    Cassell Clement 01/26/2018, 9:42 AM  Pager: [phone] If no answer or after 5 PM call [phone] Lab Results  Component Value Date   HGB 9.9 (L) 01/26/2018   HGB 10.3 (L) 01/26/2018   HGB 7.8 (L)  01/25/2018   HGB 13.6 09/22/2017   HGB 12.8 08/24/2017   HGB 12.2 07/27/2017   HCT 29.6 (L) 01/26/2018   HCT 30.9 (L) 01/26/2018   HCT 23.7 (L) 01/25/2018   HCT 41.6 09/22/2017   HCT 38.8 08/24/2017   HCT 37.7 07/27/2017   ALKPHOS 36 (L) 01/25/2018   ALKPHOS 25 (L) 01/24/2018   ALKPHOS 36 (L) 01/20/2018   ALKPHOS 34 (L) 09/22/2017   ALKPHOS 39 (L) 08/24/2017   ALKPHOS 32 (L) 07/27/2017   AST 16 01/25/2018   AST 16 01/24/2018   AST 17 01/20/2018   AST 19 09/22/2017   AST 18 08/24/2017   AST 15 07/27/2017   ALT 11 (L) 01/25/2018   ALT 11 (L) 01/24/2018   ALT 12 (L) 01/20/2018   ALT 11 09/22/2017   ALT 9 08/24/2017   ALT <6 07/27/2017

## 2018-01-26 NOTE — Progress Notes (Signed)
Pt very anxious, states she takes anxiety medication 2-3 times daily, but only take once a day since in hospital, also states she take pain medication every 4 hour, unwilling to stay in bed or chair, MD notified, Edward Qualia RN

## 2018-01-26 NOTE — Progress Notes (Signed)
Physical Therapy Treatment Patient Details Name: Angel [name] MRN: [id number] DOB: Feb 28, 1932 Today's Date: 01/26/2018    History of Present Illness 84 y.o. female with a hx of HTN, dyslipidemia, CAD, PVCs, GERD, and breast cancer s/p B mastectomy with chemoradiation who presented with complaints of chest pain.  She normally experiences a burning pain across her chest that makes it sometimes hard for her to breathe when she exerts herself.  This pain has been present since she was diagnosed with breast cancer and had a double mastectomy with radiation.  However, over the 1-2 days prior to this admit her symptoms had been constant and present even at rest.  In the ED she was noted to be in A. fib with RVR with heart ratesinto the 150-160s. She was noted to be tachypneic,but O2 saturations were maintained on room air.Pt very upset that she has not had BM since admit.      PT Comments    Pt was seen for bed ex as she declines to get OOB and walk from complaints of fatigue.  Worked on bed ex with no complaints until the end at which point pt began to note back pain.  Asked her when this began and she reported the entire session.  Called nursing for meds and pt then rested.  She was premedicated before PT session and then ck to see what she could have further.  Follow acutely for strengthening to her LE's and gait as tolerated.   Follow Up Recommendations  Home health PT;Supervision/Assistance - 24 hour     Equipment Recommendations  Other (comment)(TBD)    Recommendations for Other Services       Precautions / Restrictions Precautions Precautions: Fall Restrictions Weight Bearing Restrictions: No    Mobility  Bed Mobility                  Transfers                    Ambulation/Gait             General Gait Details: declined   Stairs             Wheelchair Mobility    Modified Rankin (Stroke Patients Only)       Balance                                             Cognition Arousal/Alertness: Awake/alert Behavior During Therapy: WFL for tasks assessed/performed Overall Cognitive Status: No family/caregiver present to determine baseline cognitive functioning Area of Impairment: Safety/judgement;Awareness;Problem solving                   Current Attention Level: Selective Memory: Decreased recall of precautions;Decreased short-term memory   Safety/Judgement: Decreased awareness of safety;Decreased awareness of deficits Awareness: Intellectual Problem Solving: Slow processing General Comments: pt was not aware of telling PT about her back hurting and was limited in explanation of the issue      Exercises General Exercises - Lower Extremity Ankle Circles/Pumps: AROM;Both;10 reps Quad Sets: AROM;Both;10 reps Gluteal Sets: AROM;Both;10 reps Heel Slides: AROM;Both;20 reps Hip ABduction/ADduction: AROM;Both;20 reps    General Comments        Pertinent Vitals/Pain Pain Assessment: Faces Faces Pain Scale: Hurts little more Pain Location: back Pain Descriptors / Indicators: Sore Pain Intervention(s): (pt did not complain about it until after doing  her exercises)    Home Living                      Prior Function            PT Goals (current goals can now be found in the care plan section) Progress towards PT goals: Progressing toward goals    Frequency    Min 3X/week      PT Plan Current plan remains appropriate    Co-evaluation              AM-PAC PT "6 Clicks" Daily Activity  Outcome Measure  Difficulty turning over in bed (including adjusting bedclothes, sheets and blankets)?: None Difficulty moving from lying on back to sitting on the side of the bed? : None Difficulty sitting down on and standing up from a chair with arms (e.g., wheelchair, bedside commode, etc,.)?: None Help needed moving to and from a bed to chair (including a wheelchair)?: A  Little Help needed walking in hospital room?: A Little Help needed climbing 3-5 steps with a railing? : A Lot 6 Click Score: 20    End of Session   Activity Tolerance: Patient limited by fatigue;Patient limited by pain Patient left: in bed;with call bell/phone within reach;with bed alarm set Nurse Communication: Mobility status;Patient requests pain meds PT Visit Diagnosis: Unsteadiness on feet (R26.81);Muscle weakness (generalized) (M62.81)     Time: 8841-6606 PT Time Calculation (min) (ACUTE ONLY): 24 min  Charges:  $Therapeutic Exercise: 23-37 mins                    G Codes:  Functional Assessment Tool Used: AM-PAC 6 Clicks Basic Mobility     Ramond Dial 01/26/2018, 6:25 PM   Mee Hives, PT MS Acute Rehab Dept. Number: [place] and [place]

## 2018-01-26 NOTE — Progress Notes (Signed)
Called to pt room, pt states that she is refusing to have colonoscopy/EGD and will not take anymore bowel prep, states she is having too much anxiety, educated pt on need for procedure to prevent future complications, pt verbalized understanding and states she is willing to deal with the complications of not having it done, MD notified, one time dose of xanax ordered and given, will continue to assess and encourage compliance with procedure.  Edward Qualia RN

## 2018-01-27 ENCOUNTER — Inpatient Hospital Stay (HOSPITAL_COMMUNITY): Payer: Medicare Other | Admitting: Certified Registered"

## 2018-01-27 ENCOUNTER — Encounter (HOSPITAL_COMMUNITY)
Admission: EM | Disposition: A | Discharge status: Skilled Nursing Facility | Payer: Self-pay | Source: Home / Self Care | Attending: Family Medicine

## 2018-01-27 ENCOUNTER — Encounter (HOSPITAL_COMMUNITY): Payer: Self-pay | Admitting: *Deleted

## 2018-01-27 HISTORY — PX: COLONOSCOPY WITH PROPOFOL: SHX5780

## 2018-01-27 HISTORY — PX: ESOPHAGOGASTRODUODENOSCOPY (EGD) WITH PROPOFOL: SHX5813

## 2018-01-27 LAB — CBC
HCT: 31.5 % — ABNORMAL LOW (ref 36.0–46.0)
Hemoglobin: 10.2 g/dL — ABNORMAL LOW (ref 12.0–15.0)
MCH: 29 pg (ref 26.0–34.0)
MCHC: 32.4 g/dL (ref 30.0–36.0)
MCV: 89.5 fL (ref 78.0–100.0)
Platelets: 191 10*3/uL (ref 150–400)
RBC: 3.52 MIL/uL — ABNORMAL LOW (ref 3.87–5.11)
RDW: 15.8 % — AB (ref 11.5–15.5)
WBC: 5.7 10*3/uL (ref 4.0–10.5)

## 2018-01-27 LAB — BASIC METABOLIC PANEL
ANION GAP: 7 (ref 5–15)
CALCIUM: 8.2 mg/dL — AB (ref 8.9–10.3)
CO2: 27 mmol/L (ref 22–32)
Chloride: 107 mmol/L (ref 101–111)
Creatinine, Ser: 0.53 mg/dL (ref 0.44–1.00)
GFR calc Af Amer: >60 mL/min (ref >60)
GFR calc non Af Amer: >60 mL/min (ref >60)
GLUCOSE: 89 mg/dL (ref 65–99)
Potassium: 3.5 mmol/L (ref 3.5–5.1)
Sodium: 141 mmol/L (ref 135–145)

## 2018-01-27 SURGERY — COLONOSCOPY WITH PROPOFOL
Anesthesia: Monitor Anesthesia Care

## 2018-01-27 MED ORDER — SODIUM CHLORIDE 0.9 % IV SOLN: INTRAVENOUS | Status: DC

## 2018-01-27 MED ORDER — PHENYLEPHRINE HCL 10 MG/ML IJ SOLN
INTRAMUSCULAR | Status: DC | PRN
  Administered 2018-01-27 (×4): 80 ug via INTRAVENOUS

## 2018-01-27 MED ORDER — MIDAZOLAM HCL 5 MG/5ML IJ SOLN
INTRAMUSCULAR | Status: DC | PRN
  Administered 2018-01-27: 2 mg via INTRAVENOUS

## 2018-01-27 MED ORDER — BUTAMBEN-TETRACAINE-BENZOCAINE 2-2-14 % EX AERO
INHALATION_SPRAY | CUTANEOUS | Status: DC | PRN
  Administered 2018-01-27: 2 via TOPICAL

## 2018-01-27 MED ORDER — LIDOCAINE HCL 1 % IJ SOLN
INTRAMUSCULAR | Status: DC | PRN
  Administered 2018-01-27: 60 mg via INTRADERMAL

## 2018-01-27 MED ORDER — PROPOFOL 10 MG/ML IV BOLUS
INTRAVENOUS | Status: DC | PRN
  Administered 2018-01-27 (×2): 10 mg via INTRAVENOUS

## 2018-01-27 MED ORDER — LACTATED RINGERS IV SOLN
INTRAVENOUS | Status: DC | PRN
  Administered 2018-01-27: 09:00:00 via INTRAVENOUS

## 2018-01-27 MED ORDER — PANTOPRAZOLE SODIUM 40 MG PO TBEC
40.0000 mg | DELAYED_RELEASE_TABLET | Freq: Every day | ORAL | Status: DC
  Administered 2018-01-27 – 2018-01-30 (×4): 40 mg via ORAL
  Filled 2018-01-27 (×4): qty 1

## 2018-01-27 MED ORDER — OFF THE BEAT BOOK
Freq: Once | Status: AC
  Administered 2018-01-28: 1
  Filled 2018-01-27: qty 1

## 2018-01-27 MED ORDER — PROPOFOL 500 MG/50ML IV EMUL
INTRAVENOUS | Status: DC | PRN
  Administered 2018-01-27: 25 ug/kg/min via INTRAVENOUS
  Administered 2018-01-27: 125 ug/kg/min via INTRAVENOUS

## 2018-01-27 SURGICAL SUPPLY — 25 items

## 2018-01-27 NOTE — Op Note (Signed)
[facility] Patient Name: Angel [name] Procedure Date : 01/27/2018 MRN: [id number] Attending MD: Wonda Horner , MD Date of Birth: June 18, 1932 CSN: [id number] Age: 84 Admit Type: Inpatient Procedure:                Upper GI endoscopy Indications:              Iron deficiency anemia secondary to chronic blood                            loss, Heme positive stool Providers:                Wonda Horner, MD, Baird Cancer, RN, Elspeth Cho Tech., Technician, Theodoro Grist, CRNA Referring MD:              Medicines:                Propofol per Anesthesia Complications:            No immediate complications. Estimated Blood Loss:     Estimated blood loss was minimal. Procedure:                Pre-Anesthesia Assessment:                           - Prior to the procedure, a History and Physical                            was performed, and patient medications and                            allergies were reviewed. The patient's tolerance of                            previous anesthesia was also reviewed. The risks                            and benefits of the procedure and the sedation                            options and risks were discussed with the patient.                            All questions were answered, and informed consent                            was obtained. Prior Anticoagulants: The patient has                            taken no previous anticoagulant or antiplatelet                            agents. ASA Grade Assessment: II - A patient with  mild systemic disease. After reviewing the risks                            and benefits, the patient was deemed in                            satisfactory condition to undergo the procedure.                           After obtaining informed consent, the endoscope was                            passed under direct vision. Throughout the        procedure, the patient's blood pressure, pulse, and                            oxygen saturations were monitored continuously. The                            [id number] ([id number]) scope was introduced through the                            mouth, and advanced to the second part of duodenum.                            The upper GI endoscopy was accomplished without                            difficulty. The patient tolerated the procedure                            well. Scope In: Scope Out: Findings:      The examined esophagus was normal.      One cratered gastric ulcer with no stigmata of bleeding was found in the       gastric antrum. The lesion was 10 mm in largest dimension. Biopsies were       taken with a cold forceps for histology.      The examined duodenum was normal. Impression:               - Normal esophagus.                           - Gastric ulcer with no stigmata of bleeding.                            Biopsied.                           - Normal examined duodenum. Recommendation:           - Resume regular diet.                           - Continue present medications.                           -  Patient was on Celebrex per hx. Would avoid this                            now in view of ulcer. Also needs PPI therapy.                            Protonix 40mg  daily.                           - Return to my office in 1 month. Procedure Code(s):        --- Professional ---                           [phone], Esophagogastroduodenoscopy, flexible,                            transoral; with biopsy, single or multiple Diagnosis Code(s):        --- Professional ---                           K25.9, Gastric ulcer, unspecified as acute or                            chronic, without hemorrhage or perforation                           D50.0, Iron deficiency anemia secondary to blood                            loss (chronic)                           R19.5, Other fecal  abnormalities CPT copyright 2017 American Medical Association. All rights reserved. The codes documented in this report are preliminary and upon coder review may  be revised to meet current compliance requirements. Wonda Horner, MD 01/27/2018 10:12:49 AM This report has been signed electronically. Number of Addenda: 0

## 2018-01-27 NOTE — Clinical Social Work Note (Signed)
Clinical Social Work Assessment  Patient Details  Name: Angel [name] MRN: [id number] Date of Birth: January 10, 1932  Date of referral:  01/27/18               Reason for consult:  Facility Placement, Discharge Planning                Permission sought to share information with:  Facility Sport and exercise psychologist, Family Supports Permission granted to share information::  Yes, Verbal Permission Granted  Name::     Angel [name] and Visual merchandiser::  SNFs  Relationship::  daughters  Contact Information:     Housing/Transportation Living arrangements for the past 2 months:  Apartment Source of Information:  Adult Children Patient Interpreter Needed:  None Criminal Activity/Legal Involvement Pertinent to Current Situation/Hospitalization:  No - Comment as needed Significant Relationships:  Adult Children, Other Family Members Lives with:  Self Do you feel safe going back to the place where you live?  Yes Need for family participation in patient care:  Yes (Comment)  Care giving concerns:  Pt lives by herself in an apartment, needs supervision and support for home health but pt daughters feel unable to provide this currently. Pt family has talked with pt and she is amenable to SNF.    Social Worker assessment / plan: RN Tourist information centre manager spoke with pt daughter Angel [name] and this Probation officer spoke with pt daughter Angel [name]. Both daughters and son are in agreement that pt would benefit from SNF before returning either to her home or to [place] home. Pt daughter Angel [name] states that Angel [name] is able to help a few hours a day but due to chronic health conditions is limited. Angel [name] states she is amenable to helping but would appreciate therapies etc on SNF level for pt before returning home as she feels unprepared. Pt daughter states she would like to see offers and discuss with her siblings and the pt.   CSW to follow up with any offers, pt will need insurance authorization.   Employment status:  Retired Designer, industrial/product PT Recommendations:  Home with [place] / Referral to community resources:  [place]  Patient/Family's Response to care:  Pt daughter Angel [name] understands process for SNF, CSW role, and aware that insurance authorization must be obtained for pt before discharge to a SNF.  Patient/Family's Understanding of and Emotional Response to Diagnosis, Current Treatment, and Prognosis:  Pt family state understanding of diagnosis, current treatment and prognosis. Pt daughter states that she feels currently unprepared to support pt but if she is able to make arrangements at her house and have the pt get therapy that she would feel more comfortable taking care of pt.   Emotional Assessment Appearance:  Appears stated age Attitude/Demeanor/Rapport:  Engaged Affect (typically observed):  Anxious, Appropriate, Adaptable Orientation:  Oriented to Self, Oriented to  Time, Oriented to Situation, Oriented to Place, Fluctuating Orientation (Suspected and/or reported Sundowners) Alcohol / Substance use:  Not Applicable Psych involvement (Current and /or in the community):  No (Comment)  Discharge Needs  Concerns to be addressed:  Care Coordination, Discharge Planning Concerns Readmission within the last 30 days:  No Current discharge risk:  Lives alone, Cognitively Impaired, Dependent with Mobility Barriers to Discharge:  Continued Medical Work up, [employer], [place] 01/27/2018, 4:52 PM

## 2018-01-27 NOTE — Op Note (Signed)
[facility] Patient Name: Angel [name] Procedure Date : 01/27/2018 MRN: [id number] Attending MD: Wonda Horner , MD Date of Birth: 1931-10-31 CSN: [id number] Age: 84 Admit Type: Inpatient Procedure:                Colonoscopy Indications:              Heme positive stool, Iron deficiency anemia                            secondary to chronic blood loss Providers:                Wonda Horner, MD, Baird Cancer, RN, Elspeth Cho Tech., Technician, Theodoro Grist, CRNA Referring MD:              Medicines:                Propofol per Anesthesia Complications:            No immediate complications. Estimated Blood Loss:     Estimated blood loss: none. Procedure:                Pre-Anesthesia Assessment:                           - Prior to the procedure, a History and Physical                            was performed, and patient medications and                            allergies were reviewed. The patient's tolerance of                            previous anesthesia was also reviewed. The risks                            and benefits of the procedure and the sedation                            options and risks were discussed with the patient.                            All questions were answered, and informed consent                            was obtained. Prior Anticoagulants: The patient has                            taken no previous anticoagulant or antiplatelet                            agents. ASA Grade Assessment: II - A patient with  mild systemic disease. After reviewing the risks                            and benefits, the patient was deemed in                            satisfactory condition to undergo the procedure.                           After obtaining informed consent, the colonoscope                            was passed under direct vision. Throughout the   procedure, the patient's blood pressure, pulse, and                            oxygen saturations were monitored continuously. The                            [id number] ([id number]) scope was introduced through                            the anus and advanced to the the cecum, identified                            by appendiceal orifice and ileocecal valve. The                            ileocecal valve, appendiceal orifice, and rectum                            were photographed. The colonoscopy was performed                            without difficulty. The patient tolerated the                            procedure well. The quality of the bowel                            preparation was fair. Scope In: 9:50:15 AM Scope Out: 10:00:47 AM Scope Withdrawal Time: 0 hours 4 minutes 10 seconds  Total Procedure Duration: 0 hours 10 minutes 32 seconds  Findings:      The perianal and digital rectal examinations were normal.      The colon (entire examined portion) appeared normal. Impression:               - Preparation of the colon was fair.                           - The entire examined colon is normal.                           - No specimens collected. Recommendation:           - Resume  regular diet.                           - Continue present medications.                           - Repeat colonoscopy is not recommended for                            screening purposes. Procedure Code(s):        --- Professional ---                           [phone], Colonoscopy, flexible; diagnostic, including                            collection of specimen(s) by brushing or washing,                            when performed (separate procedure) Diagnosis Code(s):        --- Professional ---                           R19.5, Other fecal abnormalities                           D50.0, Iron deficiency anemia secondary to blood                            loss (chronic) CPT copyright 2017 American Medical  Association. All rights reserved. The codes documented in this report are preliminary and upon coder review may  be revised to meet current compliance requirements. Wonda Horner, MD 01/27/2018 10:14:51 AM This report has been signed electronically. Number of Addenda: 0

## 2018-01-27 NOTE — NC FL2 (Signed)
[place] LEVEL OF CARE SCREENING TOOL     IDENTIFICATION  Patient Name: Angel [name] Birthdate: 10-18-1931 Sex: female Admission Date (Current Location): 01/20/2018  [facility] [place] and [place] Number:  Herbalist and Address:  The . [facility], [place] [address], [place], [place] [address]      Provider Number: [id number]  Attending Physician Name and Address:  Deatra James, MD  Relative Name and Phone Number:  Nicanor Alcon   [phone]  Current Level of Care: Hospital Recommended Level of Care: [place] Prior Approval Number:    Date Approved/Denied:   PASRR Number: [id number]  Discharge Plan: SNF    Current Diagnoses: Patient Active Problem List   Diagnosis Date Noted  . Acute post-hemorrhagic anemia 01/25/2018  . Atrial fibrillation with RVR ([place]) 01/20/2018  . Spinal stenosis   . Skin cancer of face   . Reflux esophagitis   . PVC's (premature ventricular contractions)   . Pre-diabetes   . Osteoarthritis   . Kidney stones   . Hx of radiation therapy   . GERD (gastroesophageal reflux disease)   . Depression   . Degenerative joint disease   . Coronary artery disease   . COPD (chronic obstructive pulmonary disease) ([place])   . Cholelithiasis   . Cataract   . Cancer of chest (wall) ([place])   . Anxiety   . Malignant neoplasm of overlapping sites of right breast in female, estrogen receptor positive ([place]) 10/21/2016  . Osteoporosis 02/18/2016  . Corneal scar, left eye 12/31/2015  . Frequent falls 08/02/2015  . DOE (dyspnea on exertion) 08/02/2015  . Anxiety and depression 07/23/2015  . Anemia of chronic disease 07/23/2015  . Pain in the chest   . Nuclear sclerosis of left eye 07/17/2015  . Cystoid macular edema of right eye 11/14/2014  . Chest pain 11/01/2014  . Dyslipidemia   . Chest wall recurrence of breast cancer ([place]) 09/27/2014  . Recurrent cancer of right breast ([place]) 05/29/2014  .  Essential hypertension 05/01/2014  . Epiretinal membrane, right eye 08/22/2013  . Lamellar macular hole of right eye 08/22/2013  . Chronic pain 02/11/2011  . Asthma     Orientation RESPIRATION BLADDER Height & Weight     Self, Time, Situation, Place  Normal Continent Weight: 120 lb 12.8 oz (54.8 kg) Height:  5\' 2"  (157.5 cm)  BEHAVIORAL SYMPTOMS/MOOD NEUROLOGICAL BOWEL NUTRITION STATUS      Continent Diet(see discharge summary)  AMBULATORY STATUS COMMUNICATION OF NEEDS Skin   Limited Assist Verbally Normal                       Personal Care Assistance Level of Assistance  Bathing, Feeding, Dressing Bathing Assistance: Limited assistance Feeding assistance: Independent Dressing Assistance: Limited assistance     Functional Limitations Info  Sight, Hearing, Speech Sight Info: Adequate Hearing Info: Adequate Speech Info: Adequate    SPECIAL CARE FACTORS FREQUENCY  PT (By licensed PT), OT (By licensed OT)     PT Frequency: 5x week OT Frequency: 5x week            Contractures Contractures Info: Not present    Additional Factors Info  Code Status, Allergies Code Status Info: Full Code Allergies Info: ADHESIVE TAPE, GABAPENTIN, PENICILLINS            Current Medications (01/27/2018):  This is the current hospital active medication list Current Facility-Administered Medications  Medication Dose Route Frequency Provider Last Rate  Last Dose  . 0.9 %  sodium chloride infusion   Intravenous Continuous Deatra James, MD   Stopped at 01/26/18 0205  . 0.9 %  sodium chloride infusion   Intravenous Once Shahmehdi, Seyed A, MD      . acetaminophen (TYLENOL) tablet 650 mg  650 mg Oral Q4H PRN Skipper Cliche A, MD   650 mg at 01/25/18 1828  . amiodarone (NEXTERONE PREMIX) 360-4.14 MG/200ML-% (1.8 mg/mL) IV infusion  30 mg/hr Intravenous Continuous Shahmehdi, Seyed A, MD   Stopped at 01/26/18 1300  . atorvastatin (LIPITOR) tablet 20 mg  20 mg Oral Daily Shahmehdi,  Seyed A, MD   20 mg at 01/27/18 1043  . diazepam (VALIUM) tablet 5 mg  5 mg Oral QHS PRN Skipper Cliche A, MD   5 mg at 01/26/18 2156  . diphenhydrAMINE (BENADRYL) injection 12.5 mg  12.5 mg Intravenous Q6H PRN Skipper Cliche A, MD   12.5 mg at 01/26/18 2158  . DULoxetine (CYMBALTA) DR capsule 60 mg  60 mg Oral Daily Shahmehdi, Seyed A, MD   60 mg at 01/27/18 1044  . ezetimibe (ZETIA) tablet 10 mg  10 mg Oral Daily Shahmehdi, Seyed A, MD   10 mg at 01/27/18 1043  . gi cocktail (Maalox,Lidocaine,Donnatal)  30 mL Oral QID PRN Skipper Cliche A, MD   30 mL at 01/21/18 1207  . hydroxypropyl methylcellulose / hypromellose (ISOPTO TEARS / GONIOVISC) 2.5 % ophthalmic solution 1 drop  1 drop Both Eyes TID PRN Shahmehdi, Seyed A, MD      . lactulose (CHRONULAC) 10 GM/15ML solution 30 g  30 g Oral BID PRN Skipper Cliche A, MD   30 g at 01/26/18 0430  . levalbuterol (XOPENEX) nebulizer solution 0.63 mg  0.63 mg Nebulization Q3H PRN Shahmehdi, Seyed A, MD      . magnesium citrate solution 1 Bottle  1 Bottle Oral Daily PRN Shahmehdi, Seyed A, MD      . metoprolol tartrate (LOPRESSOR) tablet 12.5 mg  12.5 mg Oral BID Shahmehdi, Seyed A, MD   12.5 mg at 01/27/18 1043  . mometasone-formoterol (DULERA) 200-5 MCG/ACT inhaler 2 puff  2 puff Inhalation BID Skipper Cliche A, MD   2 puff at 01/27/18 1042  . montelukast (SINGULAIR) tablet 10 mg  10 mg Oral QHS Shahmehdi, Seyed A, MD   10 mg at 01/26/18 2157  . morphine 2 MG/ML injection 2 mg  2 mg Intravenous Q2H PRN Skipper Cliche A, MD   2 mg at 01/26/18 2021  . multivitamin with minerals tablet 1 tablet  1 tablet Oral Daily Shahmehdi, Seyed A, MD   1 tablet at 01/27/18 1042  . ondansetron (ZOFRAN) injection 4 mg  4 mg Intravenous Q6H PRN Skipper Cliche A, MD   4 mg at 01/25/18 2312  . oxyCODONE-acetaminophen (PERCOCET/ROXICET) 5-325 MG per tablet 1 tablet  1 tablet Oral Q4H PRN Deatra James, MD   1 tablet at 01/27/18 1547  . pantoprazole (PROTONIX)  EC tablet 40 mg  40 mg Oral Daily Shahmehdi, Seyed A, MD   40 mg at 01/27/18 1322  . polyethylene glycol (MIRALAX / GLYCOLAX) packet 17 g  17 g Oral BID Shahmehdi, Seyed A, MD   17 g at 01/26/18 1002  . senna-docusate (Senokot-S) tablet 1 tablet  1 tablet Oral BID Skipper Cliche A, MD   1 tablet at 01/26/18 1002  . sodium phosphate (FLEET) 7-19 GM/118ML enema 1 enema  1 enema Rectal Daily PRN Shahmehdi, Seyed A,  MD      . sorbitol 70 % solution 30 mL  30 mL Oral Daily PRN Skipper Cliche A, MD   30 mL at 01/24/18 1111     Discharge Medications: Please see discharge summary for a list of discharge medications.  Relevant Imaging Results:  Relevant Lab Results:   Additional Information SS# [place], [place]

## 2018-01-27 NOTE — H&P (Signed)
The patient presents to the endoscopy unit for EGD and colonoscopy to evaluate for anemia  Physical: No distress  Heart regular rhythm no murmurs  Lungs clear  Abdomen soft and nontender  Impression anemia  Plan: EGD and colonoscopy

## 2018-01-27 NOTE — Transfer of Care (Signed)
Immediate Anesthesia Transfer of Care Note  Patient: Angel [name]  Procedure(s) Performed: COLONOSCOPY WITH PROPOFOL (N/A ) ESOPHAGOGASTRODUODENOSCOPY (EGD) WITH PROPOFOL (N/A )  Patient Location: Endoscopy Unit  Anesthesia Type:MAC  Level of Consciousness: sedated  Airway & Oxygen Therapy: Patient connected to nasal cannula oxygen  Post-op Assessment: Post -op Vital signs reviewed and stable  Post vital signs: stable  Last Vitals:  Vitals Value Taken Time  BP    Temp    Pulse    Resp    SpO2      Last Pain:  Vitals:   01/27/18 0904  TempSrc: Oral  PainSc: 8       Patients Stated Pain Goal: 2 (09/98/33 8250)  Complications: No apparent anesthesia complications

## 2018-01-27 NOTE — Anesthesia Postprocedure Evaluation (Signed)
Anesthesia Post Note  Patient: CURRY DULSKI  Procedure(s) Performed: COLONOSCOPY WITH PROPOFOL (N/A ) ESOPHAGOGASTRODUODENOSCOPY (EGD) WITH PROPOFOL (N/A )     Patient location during evaluation: PACU Anesthesia Type: MAC Level of consciousness: awake and alert Pain management: pain level controlled Vital Signs Assessment: post-procedure vital signs reviewed and stable Respiratory status: spontaneous breathing, nonlabored ventilation, respiratory function stable and patient connected to nasal cannula oxygen Cardiovascular status: stable and blood pressure returned to baseline Postop Assessment: no apparent nausea or vomiting Anesthetic complications: no    Last Vitals:  Vitals:   01/27/18 1020 01/27/18 1400  BP: 129/80 (!) 109/51  Pulse:  76  Resp: 19 20  Temp:  37 C  SpO2: 98% 95%    Last Pain:  Vitals:   01/27/18 1400  TempSrc: Oral  PainSc:                  Genene Kilman DAVID

## 2018-01-27 NOTE — Anesthesia Procedure Notes (Signed)
Procedure Name: MAC Date/Time: 01/27/2018 9:42 AM Performed by: Lavell Luster, CRNA Pre-anesthesia Checklist: Patient identified, Emergency Drugs available, Suction available, Timeout performed and Patient being monitored Patient Re-evaluated:Patient Re-evaluated prior to induction Oxygen Delivery Method: Nasal cannula Preoxygenation: Pre-oxygenation with 100% oxygen Induction Type: IV induction Placement Confirmation: breath sounds checked- equal and bilateral and positive ETCO2 Dental Injury: Teeth and Oropharynx as per pre-operative assessment

## 2018-01-27 NOTE — Progress Notes (Signed)
[name]  YWV:[id number] DOB: 02-Nov-1931 DOA: 01/20/2018 PCP: Ann Held, DO    Brief Narrative:  84yo F w/ a hx of HTN, dyslipidemia, CAD, PVCs, GERD, and breast cancer s/p B mastectomies with chemo/radiation who presented with chest pain.  She normally experiences a burning pain across her chest that makes it hard for her to breathe when she exerts herself.  This pain has been present since she was diagnosed with breast cancer and had a double mastectomy with radiation.  However, over the 1-2 days prior to this admit her symptoms became constant and present even at rest.   In the ED she was noted to be in A. fib with RVR with heart rates into the 150-160s.  She was noted to be tachypneic, but O2 saturations were maintained on room air.   _________________________________________________________________________________ Significant Events: 4/26 admit  4/27 -TTE - 60-65% - no WMA  5/1 - hemoglobin was 5.1 - S/P Total of 4 U Of PRBC tx -->9.9 5/3/ -EGD/colonoscopy -----------------------------------------------------------------------------------------------------------------------------------  Subjective:  Seen and examined this morning, stable no acute distress no issues overnight, n.p.o., bowel prep in anticipation of of EGD and colonoscopy today.  Assessment & Plan:  Acute GI bleed, exacerbated possibly by heparin drip, severe hemorrhagic anemia secondary to GI bleed -Hemoglobin 12.0->9->5.6-->5.1 --->S/P Total of 4 U Of PRBC tx -->9.9 -->10.2  -GI following, n.p.o. overnight bowel prep in anticipation of EGD/colonoscopy today, 5/3 -DC heparin drip GI bleed.   Newly diagnosed Afib w/ RVR -  CHA2DS2 VASc score is 5  - Card. following, patient is obviously not a candidate for chronic anticoagulation due to his known dementia and GI bleed Artisan was changed to amiodarone, beta-blocker was added -Patient was scheduled to go  on Dr. TEE DCCV, she converted to normal sinus rhythm on 430, -Cardiology following closely -Continue amiodarone  Coronary artery disease-moderate nonobstructive coronary artery disease on cath 11/16/2014 with 50 to 70% stenosis of LAD -Continue medical management per record, beta-blocker, statins  Hypotension w/ hx of HTN BP well controlled at this time, w/ episodes of modest hypotension   Normocytic Anemia -exacerbated by hemorrhagic anemia -H&H now stable status post transfusion of 4 units of packed red blood cell Hgb was 11.9 01/11/18 - anemia panel c/w Fe deficiency -  -Gastroneurology team following, planning for EGD and colonoscopy  ?Cognitive deficits - ?Dementia Per RN "Pt appears to be forgetful and at times cannot remember that she's gotten an extra dose of xanax or received laxative and prune juice." - TSH normal -  B12 marginal therefore will replace - folic acid normal - obtain CT head once bowel issues stabilized to r/o acute findings and eval for extent of atrophy - check RPR and ammonia level for completeness   HLD Continue usual home medical therapy  COPD Well compensated  Breast cancer s/p bilateral mastectomy Chest wall recurrence s/p resection in 2015 - no recurrent of disease since 2015 - maintained on fulvestrant q month - also receives denosumab q81months  Chronic pain followed by Dr Nelva Bush - she reported Dr Nelva Bush recently increased her pain meds to q6hrs prn - Dr Jana Hakim also recently prescribed valium - pain appears well controlled at this time   DVT prophylaxis: IV heparin  Code Status: FULL CODE Family Communication: no family present at time of exam  Disposition Plan: PT/OT - cognitive eval - anemia eval - DCCV pending   Consultants:  Cardiology   Antimicrobials:  none  Objective: Blood pressure 129/80, pulse 69, temperature 97.6 F (36.4 C), temperature source Oral, resp. rate 19, height 5\' 2"  (1.575 m), weight 54.8 kg (120 lb 12.8 oz), SpO2  98 %.  Intake/Output Summary (Last 24 hours) at 01/27/2018 1246 Last data filed at 01/27/2018 0737 Gross per 24 hour  Intake 940.2 ml  Output 700 ml  Net 240.2 ml   Filed Weights   01/25/18 0432 01/26/18 0345 01/27/18 0413  Weight: 53.8 kg (118 lb 9.6 oz) 55 kg (121 lb 3.2 oz) 54.8 kg (120 lb 12.8 oz)    Examination: BP 129/80   Pulse 69   Temp 97.6 F (36.4 C) (Oral)   Resp 19   Ht 5\' 2"  (1.575 m)   Wt 54.8 kg (120 lb 12.8 oz)   SpO2 98%   BMI 22.09 kg/m    Physical Exam  Constitution:  Alert, cooperative, no distress,  Psychiatric: Normal and stable HEENT: Normocephalic, PERRL, otherwise with in Normal limits  Cardio vascular:   Irregularly irregular  chest/pulmonary: Clear to auscultation bilaterally, respirations unlabored  Chest symmetric Abdomen: Soft, non-tender, non-distended, bowel sounds,no masses, no organomegaly Muscular skeletal: Limited exam - in bed, able to move all 4 extremities, Normal strength,  Neuro: CNII-XII intact. , normal motor and sensation, reflexes intact  Extremities: No pitting edema lower extremities, +2 pulses  Skin: Dry, warm to touch, negative for any Rashes, No open wounds      CBC: Recent Labs  Lab 01/20/18 1445  01/25/18 0903  01/26/18 0408 01/26/18 0638 01/27/18 1043  WBC 7.3   < > 5.8  --  6.2  --  5.7  NEUTROABS 5.3  --   --   --   --   --   --   HGB 12.6   < > 5.4*   < > 10.3* 9.9* 10.2*  HCT 39.3   < > 17.3*   < > 30.9* 29.6* 31.5*  MCV 92.9   < > 93.5  --  87.3  --  89.5  PLT 271   < > 189  --  153  --  191   < > = values in this interval not displayed.   Basic Metabolic Panel: Recent Labs  Lab 01/20/18 1624  01/22/18 0358 01/24/18 0447 01/25/18 0330 01/27/18 1043  NA  --    < > 137 138 140 141  K  --    < > 3.7 3.8 3.4* 3.5  CL  --    < > 105 102 110 107  CO2  --    < > 27 27 27 27   GLUCOSE  --    < > 111* 88 86 89  BUN  --    < > 6 22* 21* <5*  CREATININE  --    < > 0.64 0.59 0.59 0.53  CALCIUM  --    <  > 8.1* 8.5* 8.0* 8.2*  MG 2.1  --  2.1 2.0  --   --   PHOS  --   --   --  2.3*  --   --    < > = values in this interval not displayed.   GFR: Estimated Creatinine Clearance: 42.1 mL/min (by C-G formula based on SCr of 0.53 mg/dL).  Liver Function Tests: Recent Labs  Lab 01/20/18 1445 01/24/18 0447 01/25/18 0330  AST 17 16 16   ALT 12* 11* 11*  ALKPHOS 36* 25* 36*  BILITOT 0.8 0.3 0.3  PROT 6.4* 5.4*  4.5*  ALBUMIN 3.7 3.1* 2.6*   Recent Labs  Lab 01/20/18 1624  LIPASE 20    Cardiac Enzymes: Recent Labs  Lab 01/20/18 2113 01/21/18 0216 01/21/18 0730  TROPONINI <0.03 <0.03 <0.03    HbA1C: Hgb A1c MFr Bld  Date/Time Value Ref Range Status  09/06/2014 10:42 AM 5.4 <5.7 % Final    Comment:                                                                           According to the ADA Clinical Practice Recommendations for 2011, when HbA1c is used as a screening test:     >=6.5%   Diagnostic of Diabetes Mellitus            (if abnormal result is confirmed)   5.7-6.4%   Increased risk of developing Diabetes Mellitus   References:Diagnosis and Classification of Diabetes Mellitus,Diabetes Care,2011,34(Suppl 1):S62-S69 and Standards of Medical Care in         Diabetes - 2011,Diabetes ERXV,4008,67 (Suppl 1):S11-S61.     05/01/2014 10:50 AM 5.6 <5.7 % Final    Comment:                                                                           According to the ADA Clinical Practice Recommendations for 2011, when HbA1c is used as a screening test:     >=6.5%   Diagnostic of Diabetes Mellitus            (if abnormal result is confirmed)   5.7-6.4%   Increased risk of developing Diabetes Mellitus   References:Diagnosis and Classification of Diabetes Mellitus,Diabetes YPPJ,0932,67(TIWPY 1):S62-S69 and Standards of Medical Care in         Diabetes - 2011,Diabetes Care,2011,34 (Suppl 1):S11-S61.       Recent Results (from the past 240 hour(s))  Urine culture      Status: None   Collection Time: 01/20/18  4:24 PM  Result Value Ref Range Status   Specimen Description URINE, RANDOM  Final   Special Requests NONE  Final   Culture   Final    NO GROWTH Performed at [facility], [address] [place], [place] [address]    Report Status 01/21/2018 FINAL  Final  MRSA PCR Screening     Status: None   Collection Time: 01/20/18 10:18 PM  Result Value Ref Range Status   MRSA by PCR NEGATIVE NEGATIVE Final    Comment:        The GeneXpert MRSA Assay (FDA approved for NASAL specimens only), is one component of a comprehensive MRSA colonization surveillance program. It is not intended to diagnose MRSA infection nor to guide or monitor treatment for MRSA infections. Performed at [facility], [place] [address] [place], [place] [address]      Scheduled Meds: . atorvastatin  20 mg Oral Daily  . DULoxetine  60 mg Oral Daily  . ezetimibe  10  mg Oral Daily  . metoprolol tartrate  12.5 mg Oral BID  . mometasone-formoterol  2 puff Inhalation BID  . montelukast  10 mg Oral QHS  . multivitamin with minerals  1 tablet Oral Daily  . pantoprazole  40 mg Oral Daily  . polyethylene glycol  17 g Oral BID  . senna-docusate  1 tablet Oral BID   Continuous Infusions: . sodium chloride Stopped (01/26/18 0205)  . sodium chloride    . amiodarone Stopped (01/26/18 1300)     LOS: 7 days   If 7PM-7AM, please contact night-coverage [email or web address] Password [email or web address] 01/27/2018, 12:46 PM

## 2018-01-27 NOTE — Anesthesia Preprocedure Evaluation (Addendum)
Anesthesia Evaluation  Patient identified by MRN, date of birth, ID band Patient awake    Reviewed: Allergy & Precautions, NPO status , Patient's Chart, lab work & pertinent test results  Airway Mallampati: I  TM Distance: >3 FB Neck ROM: Full    Dental   Pulmonary COPD,    Pulmonary exam normal        Cardiovascular hypertension, Pt. on medications + CAD and + DOE  Normal cardiovascular exam     Neuro/Psych Anxiety Depression    GI/Hepatic GERD  Controlled,  Endo/Other    Renal/GU      Musculoskeletal   Abdominal   Peds  Hematology   Anesthesia Other Findings   Reproductive/Obstetrics                            Anesthesia Physical Anesthesia Plan  ASA: III  Anesthesia Plan: MAC   Post-op Pain Management:    Induction: Intravenous  PONV Risk Score and Plan: 2 and Treatment may vary due to age or medical condition  Airway Management Planned: Simple Face Mask  Additional Equipment:   Intra-op Plan:   Post-operative Plan:   Informed Consent: I have reviewed the patients History and Physical, chart, labs and discussed the procedure including the risks, benefits and alternatives for the proposed anesthesia with the patient or authorized representative who has indicated his/her understanding and acceptance.     Plan Discussed with: CRNA and Surgeon  Anesthesia Plan Comments:         Anesthesia Quick Evaluation

## 2018-01-27 NOTE — Plan of Care (Signed)
?  Problem: Clinical Measurements: ?Goal: Will remain free from infection ?Outcome: Progressing ?  ?

## 2018-01-27 NOTE — Plan of Care (Signed)
  Problem: Clinical Measurements: Goal: Will remain free from infection Outcome: Progressing Goal: Respiratory complications will improve Outcome: Progressing Note:  No s/s of respiratory complications noted.

## 2018-01-27 NOTE — Care Management Important Message (Signed)
Important Message  Patient Details  Name: Angel [name] MRN: [id number] Date of Birth: November 09, 1931   Medicare Important Message Given:  Yes    Nafis Farnan P Wilver Tignor 01/27/2018, 3:00 PM

## 2018-01-28 LAB — CBC
HCT: 31.1 % — ABNORMAL LOW (ref 36.0–46.0)
HEMOGLOBIN: 10 g/dL — AB (ref 12.0–15.0)
MCH: 29 pg (ref 26.0–34.0)
MCHC: 32.2 g/dL (ref 30.0–36.0)
MCV: 90.1 fL (ref 78.0–100.0)
Platelets: 215 10*3/uL (ref 150–400)
RBC: 3.45 MIL/uL — ABNORMAL LOW (ref 3.87–5.11)
RDW: 15.4 % (ref 11.5–15.5)
WBC: 6.5 10*3/uL (ref 4.0–10.5)

## 2018-01-28 LAB — BASIC METABOLIC PANEL
Anion gap: 7 (ref 5–15)
BUN: 8 mg/dL (ref 6–20)
CHLORIDE: 105 mmol/L (ref 101–111)
CO2: 28 mmol/L (ref 22–32)
CREATININE: 0.69 mg/dL (ref 0.44–1.00)
Calcium: 8.8 mg/dL — ABNORMAL LOW (ref 8.9–10.3)
GFR calc Af Amer: >60 mL/min (ref >60)
GFR calc non Af Amer: >60 mL/min (ref >60)
Glucose, Bld: 135 mg/dL — ABNORMAL HIGH (ref 65–99)
Potassium: 3.7 mmol/L (ref 3.5–5.1)
SODIUM: 140 mmol/L (ref 135–145)

## 2018-01-28 MED ORDER — AMIODARONE HCL 200 MG PO TABS
200.0000 mg | ORAL_TABLET | Freq: Every day | ORAL | Status: DC
  Administered 2018-01-28 – 2018-01-30 (×3): 200 mg via ORAL
  Filled 2018-01-28 (×3): qty 1

## 2018-01-28 NOTE — Progress Notes (Signed)
PROGRESS NOTE    Angel [name]  HBZ:[id number] DOB: 04-13-1932 DOA: 01/20/2018 PCP: Ann Held, DO    Brief Narrative:  84 year old female who presented with chest pain.  Significant past medical history for hypertension, dyslipidemia, coronary artery disease, GERD breast cancer status post mastectomy and chemoradiation.  Reported constant chest pain for the last 48 hours prior to hospitalization, preceded by easy fatigability for last 2 weeks.  On the initial physical examination blood pressure was 118/70, heart rate 150 to 160, respiratory 20, saturation 95%.  Mucous membranes, lungs with decreased breath sounds bilaterally, heart S1-S2 present irregularly irregular, abdomen soft nontender, no lower extremity edema.  EKG with atrial fibrillation, normal axis, positive premature ventricular complexes.   Patient was admitted to the hospital with working diagnosis of new onset atrial fibrillation with rapid ventricular response.   Patient was placed on heparin drip for anticoagulation, and she developed severe hemorrhagic anemia due to upper GI bleed.  Assessment & Plan:   Principal Problem:   Atrial fibrillation with RVR (HCC) Active Problems:   Chest pain   Dyslipidemia   Anxiety and depression   Coronary artery disease   COPD (chronic obstructive pulmonary disease) (HCC)   Anxiety   Acute post-hemorrhagic anemia  1.  New onset atrial fibrillation with rapid ventricular response. Patient back on sinus rhythm will continue telemetry monitoring, and amiodarone-metoprolol, holding anticoagulation due to upper GI bleed. Will need physical therapy evaluation.   2.  Peptic ulcer disease with upper GI bleeding. No further bleeding, will continue antiacid therapy, tolerating po well.   3.  Acute blood loss anemia. Stable H&h, will continue to follow on cell count in am.   4.  COPD. No signs of acute exacerbation, continue oxymetry monitoring and supplemental 02 per [place].  Continue dulera.    5.  Dementia. No confusion or agitation, will continue neuro checks per unit protocol. Continue diazepam as needed. Continue duloxetine.  6. Dyslipidemia. Continue ezetimibide.   DVT prophylaxis: scd  Code Status: full Family Communication: no family at the bedside Disposition Plan: possible dc in 24 hours, pending pt evaluation    Consultants:   Cardiology   GI  Procedures:    Antimicrobials:       Subjective: Patient is feeling better, no nausea or vomiting, tolerating po well, no chest pain, very weak and deconditioned.   Objective: Vitals:   01/27/18 2338 01/28/18 0742 01/28/18 0858 01/28/18 0929  BP: (!) 98/45 121/66    Pulse: 92 66  77  Resp:      Temp:  98.8 F (37.1 C)    TempSrc:  Oral    SpO2: 93% 94% 91%   Weight:      Height:        Intake/Output Summary (Last 24 hours) at 01/28/2018 1158 Last data filed at 01/28/2018 1000 Gross per 24 hour  Intake 1080 ml  Output 2050 ml  Net -970 ml   Filed Weights   01/25/18 0432 01/26/18 0345 01/27/18 0413  Weight: 53.8 kg (118 lb 9.6 oz) 55 kg (121 lb 3.2 oz) 54.8 kg (120 lb 12.8 oz)    Examination:   General: deconditioned  Neurology: Awake and alert, non focal  E ENT: mild pallor, no icterus, oral mucosa moist Cardiovascular: No JVD. S1-S2 present, rhythmic, no gallops, rubs, or murmurs. No lower extremity edema. Pulmonary: vesicular breath sounds bilaterally, adequate air movement, no wheezing, rhonchi or rales. Gastrointestinal. Abdomen with no organomegaly, non tender, no rebound or guarding  Skin. No rashes Musculoskeletal: no joint deformities     Data Reviewed: I have personally reviewed following labs and imaging studies  CBC: Recent Labs  Lab 01/25/18 0330  01/25/18 0903 01/25/18 1849 01/26/18 0408 01/26/18 0638 01/27/18 1043 01/28/18 0500  WBC 6.3  --  5.8  --  6.2  --  5.7 6.5  HGB 5.6*   < > 5.4* 7.8* 10.3* 9.9* 10.2* 10.0*  HCT 17.4*   < > 17.3* 23.7*  30.9* 29.6* 31.5* 31.1*  MCV 93.5  --  93.5  --  87.3  --  89.5 90.1  PLT 181  --  189  --  153  --  191 215   < > = values in this interval not displayed.   Basic Metabolic Panel: Recent Labs  Lab 01/22/18 0358 01/24/18 0447 01/25/18 0330 01/27/18 1043 01/28/18 0500  NA 137 138 140 141 140  K 3.7 3.8 3.4* 3.5 3.7  CL 105 102 110 107 105  CO2 27 27 27 27 28   GLUCOSE 111* 88 86 89 135*  BUN 6 22* 21* <5* 8  CREATININE 0.64 0.59 0.59 0.53 0.69  CALCIUM 8.1* 8.5* 8.0* 8.2* 8.8*  MG 2.1 2.0  --   --   --   PHOS  --  2.3*  --   --   --    GFR: Estimated Creatinine Clearance: 42.1 mL/min (by C-G formula based on SCr of 0.69 mg/dL). Liver Function Tests: Recent Labs  Lab 01/24/18 0447 01/25/18 0330  AST 16 16  ALT 11* 11*  ALKPHOS 25* 36*  BILITOT 0.3 0.3  PROT 5.4* 4.5*  ALBUMIN 3.1* 2.6*   No results for input(s): LIPASE, AMYLASE in the last 168 hours. Recent Labs  Lab 01/25/18 0330  AMMONIA 12   Coagulation Profile: Recent Labs  Lab 01/25/18 0903  INR 1.25   Cardiac Enzymes: No results for input(s): CKTOTAL, CKMB, CKMBINDEX, TROPONINI in the last 168 hours. BNP (last 3 results) No results for input(s): PROBNP in the last 8760 hours. HbA1C: No results for input(s): HGBA1C in the last 72 hours. CBG: No results for input(s): GLUCAP in the last 168 hours. Lipid Profile: No results for input(s): CHOL, HDL, LDLCALC, TRIG, CHOLHDL, LDLDIRECT in the last 72 hours. Thyroid Function Tests: No results for input(s): TSH, T4TOTAL, FREET4, T3FREE, THYROIDAB in the last 72 hours. Anemia Panel: No results for input(s): VITAMINB12, FOLATE, FERRITIN, TIBC, IRON, RETICCTPCT in the last 72 hours.    Radiology Studies: I have reviewed all of the imaging during this hospital visit personally     Scheduled Meds: . amiodarone  200 mg Oral Daily  . atorvastatin  20 mg Oral Daily  . DULoxetine  60 mg Oral Daily  . ezetimibe  10 mg Oral Daily  . metoprolol tartrate   12.5 mg Oral BID  . mometasone-formoterol  2 puff Inhalation BID  . montelukast  10 mg Oral QHS  . multivitamin with minerals  1 tablet Oral Daily  . pantoprazole  40 mg Oral Daily  . polyethylene glycol  17 g Oral BID  . senna-docusate  1 tablet Oral BID   Continuous Infusions: . sodium chloride Stopped (01/26/18 0205)  . sodium chloride       LOS: 8 days        Tawni Millers, MD [facility] Pager [phone]

## 2018-01-28 NOTE — Progress Notes (Signed)
Progress Note   Subjective   Doing well today, the patient denies CP or SOB.  No new concerns  Inpatient Medications    Scheduled Meds: . atorvastatin  20 mg Oral Daily  . DULoxetine  60 mg Oral Daily  . ezetimibe  10 mg Oral Daily  . metoprolol tartrate  12.5 mg Oral BID  . mometasone-formoterol  2 puff Inhalation BID  . montelukast  10 mg Oral QHS  . multivitamin with minerals  1 tablet Oral Daily  . pantoprazole  40 mg Oral Daily  . polyethylene glycol  17 g Oral BID  . senna-docusate  1 tablet Oral BID   Continuous Infusions: . sodium chloride Stopped (01/26/18 0205)  . sodium chloride    . amiodarone Stopped (01/26/18 1300)   PRN Meds: acetaminophen, diazepam, gi cocktail, hydroxypropyl methylcellulose / hypromellose, lactulose, levalbuterol, magnesium citrate, morphine injection, ondansetron (ZOFRAN) IV, oxyCODONE-acetaminophen, sodium phosphate, sorbitol   Vital Signs    Vitals:   01/27/18 2338 01/28/18 0742 01/28/18 0858 01/28/18 0929  BP: (!) 98/45 121/66    Pulse: 92 66  77  Resp:      Temp:  98.8 F (37.1 C)    TempSrc:  Oral    SpO2: 93% 94% 91%   Weight:      Height:        Intake/Output Summary (Last 24 hours) at 01/28/2018 1113 Last data filed at 01/28/2018 1000 Gross per 24 hour  Intake 1080 ml  Output 2050 ml  Net -970 ml   Filed Weights   01/25/18 0432 01/26/18 0345 01/27/18 0413  Weight: 118 lb 9.6 oz (53.8 kg) 121 lb 3.2 oz (55 kg) 120 lb 12.8 oz (54.8 kg)    Telemetry    Sinus rhythm - Personally Reviewed  Physical Exam   GEN- The patient is elderly and frail appearing, alert and oriented x 3 today.   Head- normocephalic, atraumatic Eyes-  Sclera clear, conjunctiva pink Ears- hearing intact Oropharynx- clear Neck- supple, Lungs- Clear to ausculation bilaterally, normal work of breathing Heart- Regular rate and rhythm  GI- soft, NT, ND, + BS Extremities- no clubbing, cyanosis, or edema  MS- diffuse atrophy   Labs      Chemistry Recent Labs  Lab 01/24/18 0447 01/25/18 0330 01/27/18 1043 01/28/18 0500  NA 138 140 141 140  K 3.8 3.4* 3.5 3.7  CL 102 110 107 105  CO2 27 27 27 28   GLUCOSE 88 86 89 135*  BUN 22* 21* <5* 8  CREATININE 0.59 0.59 0.53 0.69  CALCIUM 8.5* 8.0* 8.2* 8.8*  PROT 5.4* 4.5*  --   --   ALBUMIN 3.1* 2.6*  --   --   AST 16 16  --   --   ALT 11* 11*  --   --   ALKPHOS 25* 36*  --   --   BILITOT 0.3 0.3  --   --   GFRNONAA >60 >60 >60 >60  GFRAA >60 >60 >60 >60  ANIONGAP 9 3* 7 7     Hematology Recent Labs  Lab 01/26/18 0408 01/26/18 0638 01/27/18 1043 01/28/18 0500  WBC 6.2  --  5.7 6.5  RBC 3.54*  --  3.52* 3.45*  HGB 10.3* 9.9* 10.2* 10.0*  HCT 30.9* 29.6* 31.5* 31.1*  MCV 87.3  --  89.5 90.1  MCH 29.1  --  29.0 29.0  MCHC 33.3  --  32.4 32.2  RDW 15.4  --  15.8* 15.4  PLT  153  --  191 215    Cardiac EnzymesNo results for input(s): TROPONINI in the last 168 hours. No results for input(s): TROPIPOC in the last 168 hours.      Assessment & Plan    1.  New onset afib Now back in sinus chads2vasc score is at least 4.  Not currently an anticoagulation candidate due to anemia/ GI bleed. Switch amiodarone to 200mg  PO daily  Cardiology team to see as needed while here. Please call with questions.  Will schedule follow-up in AF clinic post discharge  Thompson Grayer MD, [facility] 01/28/2018 11:13 AM

## 2018-01-29 ENCOUNTER — Encounter (HOSPITAL_COMMUNITY): Payer: Self-pay | Admitting: Gastroenterology

## 2018-01-29 LAB — TYPE AND SCREEN
ABO/RH(D): A POS
ANTIBODY SCREEN: NEGATIVE
UNIT DIVISION: 0
UNIT DIVISION: 0
UNIT DIVISION: 0
UNIT DIVISION: 0
Unit division: 0
Unit division: 0
Unit division: 0
Unit division: 0

## 2018-01-29 LAB — BPAM RBC
BLOOD PRODUCT EXPIRATION DATE: 201905082359
BLOOD PRODUCT EXPIRATION DATE: 201905232359
BLOOD PRODUCT EXPIRATION DATE: 201905232359
BLOOD PRODUCT EXPIRATION DATE: 201905272359
BLOOD PRODUCT EXPIRATION DATE: 201905282359
BLOOD PRODUCT EXPIRATION DATE: 201905282359
BLOOD PRODUCT EXPIRATION DATE: 201905282359
Blood Product Expiration Date: 201905232359
ISSUE DATE / TIME: 201905011030
ISSUE DATE / TIME: 201905011315
ISSUE DATE / TIME: 201905012032
ISSUE DATE / TIME: 201905020113
ISSUE DATE / TIME: 201905040949
ISSUE DATE / TIME: 201905040949
UNIT TYPE AND RH: 6200
UNIT TYPE AND RH: 6200
UNIT TYPE AND RH: 6200
UNIT TYPE AND RH: 6200
UNIT TYPE AND RH: 6200
Unit Type and Rh: 6200
Unit Type and Rh: 6200
Unit Type and Rh: 6200

## 2018-01-29 MED ORDER — BISACODYL 10 MG RE SUPP: 10.0000 mg | Freq: Every day | RECTAL | Status: DC

## 2018-01-29 MED ORDER — ZOLPIDEM TARTRATE 5 MG PO TABS
5.0000 mg | ORAL_TABLET | Freq: Once | ORAL | Status: AC
  Administered 2018-01-29: 5 mg via ORAL
  Filled 2018-01-29: qty 1

## 2018-01-29 NOTE — Progress Notes (Signed)
PROGRESS NOTE  Angel [name] ZMO:[id number] DOB: Aug 28, 1932 DOA: 01/20/2018 PCP: Ann Held, DO  Brief Narrative:  84yo F w/ a hx ofHTN, dyslipidemia, CAD, PVCs, GERD, and breast cancer s/p Bmastectomies with chemo/radiation who presented with chest pain.She normally experiences a burning pain across her chest that makes it hard for her to breathe when she exerts herself. This pain has been present since she was diagnosed with breast cancer and had a double mastectomy with radiation. However, over the 1-2 days prior to this admit her symptoms became constant and present even at rest.  In the ED she was noted to be in A. fib with RVR with heart ratesinto the 150-160s.  She was noted to be tachypneic,but O2 saturations were maintained on room air. _________________________________________________________________________________ Significant Events: 4/26 admit  4/27 -TTE - 60-65% - no WMA  5/1 - hemoglobin was 5.1 - S/P Total of 4 U Of PRBC tx -->9.9 5/3/ -EGD/colonoscopy     HPI/Recap of past 24 hours:  Uneventful night, she denies chest pain, no sob, no fever, no edema  She reports chronic back pain, she wants to have bm daily,  Awaiting insurance authorization for snf placement  Assessment/Plan: Principal Problem:   Atrial fibrillation with RVR ([place]) Active Problems:   Chest pain   Dyslipidemia   Anxiety and depression   Coronary artery disease   COPD (chronic obstructive pulmonary disease) (HCC)   Anxiety   Acute post-hemorrhagic anemia   Acute GI bleed, exacerbated possibly by heparin drip, severe hemorrhagic anemia secondary to GI bleed, acute blood loss anemia -Hemoglobin 12.0->9->5.6-->5.1 --->S/P Total of 4 U Of PRBC tx -->9.9 -->10.2_>10, off anticoagulation -EGD + gastric ulcer, colonoscopy unremarkable, 5/3 -[facility] input appreciated   Newly diagnosed Afib w/ RVR (presenting symptom)-  -CHA2DS2 VASc score is 5  - planned TEE  DCCV cancelled due to  she converted to normal sinus rhythm spontanouesly on 430 -remain in sinus rhythm on amiodarone, beta-blocker  -patient is not a candidate for chronic anticoagulation due to known dementia and GI bleed -Cardiology input appreciated.   Coronary artery disease-moderate nonobstructive coronary artery disease on cath 11/16/2014 with 50 to 70% stenosis of LAD -Continue medical management per record, beta-blocker, statins/zetia   HTN w/ episodes of modest hypotension (possible related to afib and gi bleed) BP well controlled at this time on lopressor  HLD Continue usual home medical therapy  ?Cognitive deficits - ?Dementia Per RN "Pt appears to be forgetful and at times cannot remember that she's gotten an extra dose of xanax or received laxative and prune juice."  - TSH normal -  B12 marginal therefore will replace -  folic acid normal -RPR/HIV and ammonia level  Unremarkable  obtain CT head vs mri (h/o breast cancer) for completeness, will discuss with patient. She is not oriented to months Neurology follow up for cognitive eval/memory impairment.   COPD Well compensated  Breast cancers/p bilateral mastectomy Chest wall recurrence s/p resection in 2015 - no recurrent of disease since 2015 - maintained on fulvestrant q month - also receives denosumab q67months  Chronic pain followed by Dr Nelva Bush - she reported Dr Nelva Bush recently increased her pain meds to q6hrs prn - Dr Jana Hakim also recently prescribed valium - pain appears well controlled at this time   DVT prophylaxis: IV heparin  Code Status: FULL CODE Family Communication: no family present at time of exam  Disposition Plan:  SNF placement   Consultants:  Cardiology  [facility]  Antimicrobials:  none    Objective: BP (!) 124/58 (BP Location: Left Arm)   Pulse 70   Temp 98.7 F (37.1 C) (Oral)   Resp 18   Ht 5\' 2"  (1.575 m)   Wt 54.3 kg (119 lb 12.8 oz)   SpO2 98%   BMI 21.91  kg/m   Intake/Output Summary (Last 24 hours) at 01/29/2018 0928 Last data filed at 01/29/2018 0300 Gross per 24 hour  Intake 240 ml  Output 2100 ml  Net -1860 ml   Filed Weights   01/26/18 0345 01/27/18 0413 01/29/18 0510  Weight: 55 kg (121 lb 3.2 oz) 54.8 kg (120 lb 12.8 oz) 54.3 kg (119 lb 12.8 oz)    Exam: Patient is examined daily including today on 01/29/2018, exams remain the same as of yesterday except that has changed    General:  NAD  Cardiovascular: RRR  Respiratory: CTABL  Abdomen: Soft/ND/NT, positive BS  Musculoskeletal: No Edema  Neuro: alert, not oriented to the month, states it is march  Data Reviewed: Basic Metabolic Panel: Recent Labs  Lab 01/24/18 0447 01/25/18 0330 01/27/18 1043 01/28/18 0500  NA 138 140 141 140  K 3.8 3.4* 3.5 3.7  CL 102 110 107 105  CO2 27 27 27 28   GLUCOSE 88 86 89 135*  BUN 22* 21* <5* 8  CREATININE 0.59 0.59 0.53 0.69  CALCIUM 8.5* 8.0* 8.2* 8.8*  MG 2.0  --   --   --   PHOS 2.3*  --   --   --    Liver Function Tests: Recent Labs  Lab 01/24/18 0447 01/25/18 0330  AST 16 16  ALT 11* 11*  ALKPHOS 25* 36*  BILITOT 0.3 0.3  PROT 5.4* 4.5*  ALBUMIN 3.1* 2.6*   No results for input(s): LIPASE, AMYLASE in the last 168 hours. Recent Labs  Lab 01/25/18 0330  AMMONIA 12   CBC: Recent Labs  Lab 01/25/18 0330  01/25/18 0903 01/25/18 1849 01/26/18 0408 01/26/18 0638 01/27/18 1043 01/28/18 0500  WBC 6.3  --  5.8  --  6.2  --  5.7 6.5  HGB 5.6*   < > 5.4* 7.8* 10.3* 9.9* 10.2* 10.0*  HCT 17.4*   < > 17.3* 23.7* 30.9* 29.6* 31.5* 31.1*  MCV 93.5  --  93.5  --  87.3  --  89.5 90.1  PLT 181  --  189  --  153  --  191 215   < > = values in this interval not displayed.   Cardiac Enzymes:   No results for input(s): CKTOTAL, CKMB, CKMBINDEX, TROPONINI in the last 168 hours. BNP (last 3 results) Recent Labs    01/20/18 1624  BNP 455.0*    ProBNP (last 3 results) No results for input(s): PROBNP in the last  8760 hours.  CBG: No results for input(s): GLUCAP in the last 168 hours.  Recent Results (from the past 240 hour(s))  Urine culture     Status: None   Collection Time: 01/20/18  4:24 PM  Result Value Ref Range Status   Specimen Description URINE, RANDOM  Final   Special Requests NONE  Final   Culture   Final    NO GROWTH Performed at [facility], [address] [place], [place] [address]    Report Status 01/21/2018 FINAL  Final  MRSA PCR Screening     Status: None   Collection Time: 01/20/18 10:18 PM  Result Value Ref Range Status   MRSA  by PCR NEGATIVE NEGATIVE Final    Comment:        The GeneXpert MRSA Assay (FDA approved for NASAL specimens only), is one component of a comprehensive MRSA colonization surveillance program. It is not intended to diagnose MRSA infection nor to guide or monitor treatment for MRSA infections. Performed at [facility], [place] [address], [place], [place] [address]      Studies: No results found.  Scheduled Meds: . amiodarone  200 mg Oral Daily  . atorvastatin  20 mg Oral Daily  . bisacodyl  10 mg Rectal Daily  . DULoxetine  60 mg Oral Daily  . ezetimibe  10 mg Oral Daily  . metoprolol tartrate  12.5 mg Oral BID  . mometasone-formoterol  2 puff Inhalation BID  . montelukast  10 mg Oral QHS  . multivitamin with minerals  1 tablet Oral Daily  . pantoprazole  40 mg Oral Daily  . polyethylene glycol  17 g Oral BID  . senna-docusate  1 tablet Oral BID    Continuous Infusions: . sodium chloride       Time spent: 34mins, case discussed with cardiology and social worker I have personally reviewed and interpreted on  01/29/2018 daily labs, tele strips, imagings as discussed above under date review session and assessment and plans.  I reviewed all nursing notes, pharmacy notes, consultant notes,  vitals, pertinent old records  I have discussed plan of care as described above with RN , patient on 01/29/2018   Florencia Reasons MD,  PhD  [facility] Pager [phone]. If 7PM-7AM, please contact night-coverage at [email or web address], password [email or web address] 01/29/2018, 9:28 AM  LOS: 9 days

## 2018-01-29 NOTE — Progress Notes (Signed)
    Message sent to the Afib Clinic for outpatient follow up.

## 2018-01-29 NOTE — Plan of Care (Signed)
  Problem: Clinical Measurements: Goal: Ability to maintain clinical measurements within normal limits will improve Outcome: Progressing   Problem: Clinical Measurements: Goal: Cardiovascular complication will be avoided Outcome: Progressing   

## 2018-01-30 DIAGNOSIS — I2510 Atherosclerotic heart disease of native coronary artery without angina pectoris: Secondary | ICD-10-CM | POA: Diagnosis not present

## 2018-01-30 DIAGNOSIS — I4891 Unspecified atrial fibrillation: Secondary | ICD-10-CM | POA: Diagnosis not present

## 2018-01-30 DIAGNOSIS — E7849 Other hyperlipidemia: Secondary | ICD-10-CM | POA: Diagnosis not present

## 2018-01-30 DIAGNOSIS — I10 Essential (primary) hypertension: Secondary | ICD-10-CM | POA: Diagnosis not present

## 2018-01-30 DIAGNOSIS — Z7401 Bed confinement status: Secondary | ICD-10-CM | POA: Diagnosis not present

## 2018-01-30 DIAGNOSIS — I481 Persistent atrial fibrillation: Secondary | ICD-10-CM | POA: Diagnosis not present

## 2018-01-30 DIAGNOSIS — M2550 Pain in unspecified joint: Secondary | ICD-10-CM | POA: Diagnosis not present

## 2018-01-30 DIAGNOSIS — K219 Gastro-esophageal reflux disease without esophagitis: Secondary | ICD-10-CM | POA: Diagnosis not present

## 2018-01-30 DIAGNOSIS — R0602 Shortness of breath: Secondary | ICD-10-CM | POA: Diagnosis not present

## 2018-01-30 DIAGNOSIS — R2681 Unsteadiness on feet: Secondary | ICD-10-CM | POA: Diagnosis not present

## 2018-01-30 DIAGNOSIS — M6281 Muscle weakness (generalized): Secondary | ICD-10-CM | POA: Diagnosis not present

## 2018-01-30 DIAGNOSIS — R079 Chest pain, unspecified: Secondary | ICD-10-CM | POA: Diagnosis not present

## 2018-01-30 LAB — BASIC METABOLIC PANEL
ANION GAP: 10 (ref 5–15)
BUN: 5 mg/dL — ABNORMAL LOW (ref 6–20)
CALCIUM: 8.6 mg/dL — AB (ref 8.9–10.3)
CO2: 27 mmol/L (ref 22–32)
Chloride: 104 mmol/L (ref 101–111)
Creatinine, Ser: 0.65 mg/dL (ref 0.44–1.00)
GFR calc Af Amer: >60 mL/min (ref >60)
Glucose, Bld: 93 mg/dL (ref 65–99)
POTASSIUM: 3.4 mmol/L — AB (ref 3.5–5.1)
SODIUM: 141 mmol/L (ref 135–145)

## 2018-01-30 LAB — MAGNESIUM: MAGNESIUM: 2.1 mg/dL (ref 1.7–2.4)

## 2018-01-30 MED ORDER — SENNOSIDES-DOCUSATE SODIUM 8.6-50 MG PO TABS: 1.0000 | ORAL_TABLET | Freq: Every day | ORAL | 0 refills | Status: AC

## 2018-01-30 MED ORDER — POLYETHYLENE GLYCOL 3350 17 G PO PACK: 17.0000 g | PACK | Freq: Every day | ORAL | 0 refills | Status: AC

## 2018-01-30 MED ORDER — AMIODARONE HCL 200 MG PO TABS: 200.0000 mg | ORAL_TABLET | Freq: Every day | ORAL | 0 refills | Status: DC

## 2018-01-30 MED ORDER — OXYCODONE-ACETAMINOPHEN 5-325 MG PO TABS: 1.0000 | ORAL_TABLET | ORAL | 0 refills | Status: DC | PRN

## 2018-01-30 MED ORDER — DIAZEPAM 5 MG PO TABS: 5.0000 mg | ORAL_TABLET | Freq: Every evening | ORAL | 0 refills | Status: DC | PRN

## 2018-01-30 MED ORDER — PANTOPRAZOLE SODIUM 40 MG PO TBEC: 40.0000 mg | DELAYED_RELEASE_TABLET | Freq: Every day | ORAL | 0 refills | Status: DC

## 2018-01-30 MED ORDER — CYANOCOBALAMIN 500 MCG PO TABS: 500.0000 ug | ORAL_TABLET | Freq: Every day | ORAL | 0 refills | Status: AC

## 2018-01-30 MED ORDER — POTASSIUM CHLORIDE CRYS ER 20 MEQ PO TBCR
40.0000 meq | EXTENDED_RELEASE_TABLET | Freq: Once | ORAL | Status: AC
  Administered 2018-01-30: 40 meq via ORAL
  Filled 2018-01-30: qty 2

## 2018-01-30 MED ORDER — METOPROLOL TARTRATE 25 MG PO TABS: 12.5000 mg | ORAL_TABLET | Freq: Two times a day (BID) | ORAL | 0 refills | Status: DC

## 2018-01-30 NOTE — Progress Notes (Signed)
Physical Therapy Treatment Patient Details Name: Angel [name]: [id number] DOB: [birth year] Today's Date: 01/30/2018    History of Present Illness 84 y.o. female with a hx of HTN, dyslipidemia, CAD, PVCs, GERD, and breast cancer s/p B mastectomy with chemoradiation who presented with complaints of chest pain.  She normally experiences a burning pain across her chest that makes it sometimes hard for her to breathe when she exerts herself.  This pain has been present since she was diagnosed with breast cancer and had a double mastectomy with radiation.  However, over the 1-2 days prior to this admit her symptoms had been constant and present even at rest.  In the ED she was noted to be in A. fib with RVR with heart ratesinto the 150-160s. She was noted to be tachypneic,but O2 saturations were maintained on room air.Pt very upset that she has not had BM since admit.      PT Comments    Pt admitted with above diagnosis. Pt currently with functional limitations due to the deficits listed below (see PT Problem List). Pt was able to ambulate with some LOB which she self corrects with delayed balance reactions.  DGI 12/24 suggesting high fall risk.  Agree with SNF for therapy.  Pt will benefit from skilled PT to increase their independence and safety with mobility to allow discharge to the venue listed below.     Follow Up Recommendations  Supervision/Assistance - 24 hour;SNF     Equipment Recommendations  Other (comment)(TBD)    Recommendations for Other Services       Precautions / Restrictions Precautions Precautions: Fall Restrictions Weight Bearing Restrictions: No    Mobility  Bed Mobility Overal bed mobility: Independent                Transfers Overall transfer level: Needs assistance Equipment used: None Transfers: Sit to/from Stand Sit to Stand: Supervision         General transfer comment: Supervision for safety   Ambulation/Gait Ambulation/Gait  assistance: Min guard Ambulation Distance (Feet): 250 Feet Assistive device: None Gait Pattern/deviations: Step-through pattern;Decreased stride length;Drifts right/left   Gait velocity interpretation: 1.31 - 2.62 ft/sec, indicative of limited community ambulator General Gait Details: Pt does drift side to side with LOB in which she self corrects but takes incr time to correct with delayed balance reactions placing pt at fall risk.     Stairs             Wheelchair Mobility    Modified Rankin (Stroke Patients Only)       Balance Overall balance assessment: Needs assistance Sitting-balance support: No upper extremity supported;Feet supported Sitting balance-Leahy Scale: Good     Standing balance support: No upper extremity supported;During functional activity Standing balance-Leahy Scale: Fair Standing balance comment: able to stand statically without UE support                 Standardized Balance Assessment Standardized Balance Assessment : Dynamic Gait Index   Dynamic Gait Index Level Surface: Moderate Impairment Change in Gait Speed: Moderate Impairment Gait with Horizontal Head Turns: Mild Impairment Gait with Vertical Head Turns: Mild Impairment Gait and Pivot Turn: Mild Impairment Step Over Obstacle: Moderate Impairment Step Around Obstacles: Mild Impairment Steps: Moderate Impairment Total Score: 12      Cognition Arousal/Alertness: Awake/alert Behavior During Therapy: WFL for tasks assessed/performed Overall Cognitive Status: No family/caregiver present to determine baseline cognitive functioning Area of Impairment: Safety/judgement;Awareness;Problem solving  Orientation Level: Disoriented to;Time Current Attention Level: Selective Memory: Decreased recall of precautions;Decreased short-term memory   Safety/Judgement: Decreased awareness of safety;Decreased awareness of deficits Awareness: Intellectual Problem Solving:  Slow processing General Comments: Confusion at times noted.      Exercises General Exercises - Lower Extremity Ankle Circles/Pumps: AROM;Both;10 reps Gluteal Sets: AROM;Both;10 reps Long Arc Quad: AROM;Both;10 reps;Seated    General Comments General comments (skin integrity, edema, etc.): Pt scored 12/24 on DGI suggesting high risk of falls.       Pertinent Vitals/Pain Pain Assessment: No/denies pain Pain Descriptors / Indicators: Sore    Home Living                      Prior Function            PT Goals (current goals can now be found in the care plan section) Acute Rehab PT Goals Patient Stated Goal: to go home Progress towards PT goals: Progressing toward goals    Frequency    Min 3X/week      PT Plan Discharge plan needs to be updated    Co-evaluation              AM-PAC PT "6 Clicks" Daily Activity  Outcome Measure  Difficulty turning over in bed (including adjusting bedclothes, sheets and blankets)?: None Difficulty moving from lying on back to sitting on the side of the bed? : None Difficulty sitting down on and standing up from a chair with arms (e.g., wheelchair, bedside commode, etc,.)?: None Help needed moving to and from a bed to chair (including a wheelchair)?: A Little Help needed walking in hospital room?: A Little Help needed climbing 3-5 steps with a railing? : A Lot 6 Click Score: 20    End of Session Equipment Utilized During Treatment: Gait belt Activity Tolerance: Patient limited by fatigue;Patient limited by pain Patient left: with call bell/phone within reach;in chair;with chair alarm set Nurse Communication: Mobility status PT Visit Diagnosis: Unsteadiness on feet (R26.81);Muscle weakness (generalized) (M62.81)     Time: 7867-6720 PT Time Calculation (min) (ACUTE ONLY): 16 min  Charges:  $Gait Training: 8-22 mins                    G Codes:       Rajean Desantiago,PT Acute Rehabilitation [phone]  (pager)    Denice Paradise 01/30/2018, 1:26 PM

## 2018-01-30 NOTE — Progress Notes (Signed)
Occupational Therapy Treatment Patient Details Name: LAWREN SEXSON MRN: [id number] DOB: 11-03-1931 Today's Date: 01/30/2018    History of present illness 84 y.o. female with a hx of HTN, dyslipidemia, CAD, PVCs, GERD, and breast cancer s/p B mastectomy with chemoradiation who presented with complaints of chest pain.  She normally experiences a burning pain across her chest that makes it sometimes hard for her to breathe when she exerts herself.  This pain has been present since she was diagnosed with breast cancer and had a double mastectomy with radiation.  However, over the 1-2 days prior to this admit her symptoms had been constant and present even at rest.  In the ED she was noted to be in A. fib with RVR with heart ratesinto the 150-160s. She was noted to be tachypneic,but O2 saturations were maintained on room air.Pt very upset that she has not had BM since admit.     OT comments  Pt demonstrating progress toward OT goals today. She remains limited by confusion demonstrating poor awareness and short-term memory this session. She is able to complete toilet transfers and standing grooming tasks with supervision and cues to problem solve. Pt able to initiate following directions today but would easily forget what she was doing during the task. Feel that she will be unsafe at home alone due to cognitive and functional deficits. Thus, updated D/C recommendation to short-term SNF.   Follow Up Recommendations  Supervision/Assistance - 24 hour;SNF    Equipment Recommendations  None recommended by OT    Recommendations for Other Services      Precautions / Restrictions Precautions Precautions: Fall Restrictions Weight Bearing Restrictions: No       Mobility Bed Mobility Overal bed mobility: Independent             General bed mobility comments: OOB in recliner on my arrival.   Transfers Overall transfer level: Needs assistance Equipment used: Rolling walker (2  wheeled);None(with and without RW) Transfers: Sit to/from Stand Sit to Stand: Supervision         General transfer comment: Supervision for safety     Balance Overall balance assessment: Needs assistance Sitting-balance support: No upper extremity supported;Feet supported Sitting balance-Leahy Scale: Good     Standing balance support: No upper extremity supported;During functional activity Standing balance-Leahy Scale: Fair Standing balance comment: able to stand statically without UE support                 Standardized Balance Assessment Standardized Balance Assessment : Dynamic Gait Index   Dynamic Gait Index Level Surface: Moderate Impairment Change in Gait Speed: Moderate Impairment Gait with Horizontal Head Turns: Mild Impairment Gait with Vertical Head Turns: Mild Impairment Gait and Pivot Turn: Mild Impairment Step Over Obstacle: Moderate Impairment Step Around Obstacles: Mild Impairment Steps: Moderate Impairment Total Score: 12     ADL either performed or assessed with clinical judgement   ADL Overall ADL's : Needs assistance/impaired Eating/Feeding: Set up;Sitting   Grooming: Supervision/safety;Standing;Cueing for sequencing                   Toilet Transfer: Supervision/safety;Ambulation   Toileting- Clothing Manipulation and Hygiene: Supervision/safety;Sitting/lateral lean       Functional mobility during ADLs: Supervision/safety;Rolling walker(with and without RW) General ADL Comments: Decreased cognition was greatest safety limiting factor today.      Vision   Vision Assessment?: No apparent visual deficits   Perception     Praxis      Cognition Arousal/Alertness: Awake/alert Behavior During  Therapy: WFL for tasks assessed/performed Overall Cognitive Status: No family/caregiver present to determine baseline cognitive functioning Area of Impairment: Memory;Safety/judgement;Awareness;Problem solving                  Orientation Level: Disoriented to;Time Current Attention Level: Selective Memory: Decreased short-term memory   Safety/Judgement: Decreased awareness of safety;Decreased awareness of deficits Awareness: Intellectual Problem Solving: Slow processing General Comments: Pt reporting "this is the first time I have been up since I have been here." Although this therapist has worked with pt previously during hospitalization. Repeated stories frequently and forgot where therapist asked her to ambulate.         Exercises Exercises: General Lower Extremity General Exercises - Lower Extremity Ankle Circles/Pumps: AROM;Both;10 reps Gluteal Sets: AROM;Both;10 reps Long Arc Quad: AROM;Both;10 reps;Seated   Shoulder Instructions       General Comments Pt scored 12/24 on DGI suggesting high risk of falls.     Pertinent Vitals/ Pain       Pain Assessment: No/denies pain Pain Descriptors / Indicators: Sore  Home Living                                          Prior Functioning/Environment              Frequency  Min 2X/week        Progress Toward Goals  OT Goals(current goals can now be found in the care plan section)  Progress towards OT goals: Progressing toward goals  Acute Rehab OT Goals Patient Stated Goal: to go home OT Goal Formulation: With patient Time For Goal Achievement: 02/07/18 Potential to Achieve Goals: Good  Plan Discharge plan needs to be updated    Co-evaluation                 AM-PAC PT "6 Clicks" Daily Activity     Outcome Measure   Help from another person eating meals?: None Help from another person taking care of personal grooming?: A Little Help from another person toileting, which includes using toliet, bedpan, or urinal?: A Little Help from another person bathing (including washing, rinsing, drying)?: A Little Help from another person to put on and taking off regular upper body clothing?: A Little Help from another  person to put on and taking off regular lower body clothing?: A Little 6 Click Score: 19    End of Session Equipment Utilized During Treatment: Rolling walker  OT Visit Diagnosis: Other abnormalities of gait and mobility (R26.89);Other symptoms and signs involving cognitive function   Activity Tolerance Patient tolerated treatment well   Patient Left in chair;with call bell/phone within reach;with chair alarm set   Nurse Communication          Time: [id number] OT Time Calculation (min): 15 min  Charges: OT General Charges $OT Visit: 1 Visit OT Treatments $Self Care/Home Management : 8-22 mins  Norman Herrlich, MS OTR/L  Pager: [place] 01/30/2018, 1:53 PM

## 2018-01-30 NOTE — Clinical Social Work Placement (Signed)
   CLINICAL SOCIAL WORK PLACEMENT  NOTE  Date:  01/30/2018  Patient Details  Name: Angel [name] MRN: [id number] Date of Birth: 1932/03/13  Clinical Social Work is seeking post-discharge placement for this patient at the [place] level of care (*CSW will initial, date and re-position this form in  chart as items are completed):  Yes   Patient/family provided with [place] Work Department's list of facilities offering this level of care within the geographic area requested by the patient (or if unable, by the patient's family).  Yes   Patient/family informed of their freedom to choose among providers that offer the needed level of care, that participate in Medicare, Medicaid or managed care program needed by the patient, have an available bed and are willing to accept the patient.  Yes   Patient/family informed of [facility]'s ownership interest in [facility] and [facility], as well as of the fact that they are under no obligation to receive care at these facilities.  PASRR submitted to EDS on       PASRR number received on       Existing PASRR number confirmed on 01/27/18     FL2 transmitted to all facilities in geographic area requested by pt/family on 01/27/18     FL2 transmitted to all facilities within larger geographic area on       Patient informed that his/her managed care company has contracts with or will negotiate with certain facilities, including the following:  [facility]     Yes   Patient/family informed of bed offers received.  Patient chooses bed at [facility]     Physician recommends and patient chooses bed at      Patient to be transferred to [facility] on 01/30/18.  Patient to be transferred to facility by [facility]     Patient family notified on 01/30/18 of transfer.  Name of family member notified:  Nicanor Alcon, daughter     PHYSICIAN       Additional Comment:     _______________________________________________ Estanislado Emms, LCSW 01/30/2018, 3:23 PM

## 2018-01-30 NOTE — Progress Notes (Signed)
Daughter's first facility choice, [facility], does not have bed available for patient today. Daughter chose [facility]. [facility] has [facility] authorization for patient to admit to their facility today.  Patient will discharge to [facility] Anticipated discharge date: 01/30/18 Family notified: Nicanor Alcon, daughter Transportation by: Corey Harold  Nurse to call report to [phone].   CSW signing off.  Estanislado Emms, [place]  Clinical Social Worker

## 2018-01-30 NOTE — Care Management Important Message (Signed)
Important Message  Patient Details  Name: Angel [name] MRN: [id number] Date of Birth: 10-21-1931   Medicare Important Message Given:  Yes    Barb Merino Sabirin Baray 01/30/2018, 2:44 PM

## 2018-01-30 NOTE — Consult Note (Signed)
   [facility] CM Inpatient Consult   01/30/2018  PATRICK SOHM 31-Aug-1932 [id number]   Follow up:  Met with the patient on 01/26/18 at the bedside and consent form signed and copy given for [facility] for post hospital follow up.  Chart review today reveals that the patient will transition to a skilled nursing facility, awaiting insurance approval.  [facility] can follow for transitioning to home.   Natividad Brood, RN BSN [facility] Liaison  [phone] business mobile phone Toll free office [phone]

## 2018-01-30 NOTE — Progress Notes (Signed)
CSW met with patient at bedside and gave SNF offers and also gave offers to daughter, Freda Munro, over the phone. Freda Munro prefers [facility] and patient agreeable to [facility].   CSW spoke to [facility] and they will confirm if they still have bed available today, as they have already accepted some other patients to admit. Patient will need [facility] authorization before admitting to the facility.  CSW to follow.  Estanislado Emms, [place]

## 2018-01-30 NOTE — Discharge Summary (Addendum)
Discharge Summary  [name] BOF:[id number] DOB: 03/01/1932  PCP: Ann Held, DO  Admit date: 01/20/2018 Discharge date: 01/30/2018  Time spent: 86mins, more than 50% time spent on coordination of care, case discussed with daughter over the phone at length, case discussed with social worker  Recommendations for Outpatient Follow-up:  1. F/u with [facility] MD  for hospital discharge follow up, repeat cbc/bmp at follow up 2. F/u with Cardiology Afib clinic 3. F/u with [facility] Dr Penelope Coop for gi bleed, avoid NSAIDS 4. F/u with neurology for memory impairment 5. F/u with Dr Nelva Bush for chronic back pain 6. F/u with oncology Dr Penny Pia for breast cancer  Discharge Diagnoses:  Active Hospital Problems   Diagnosis Date Noted  . Atrial fibrillation with RVR ([place]) 01/20/2018  . Acute post-hemorrhagic anemia 01/25/2018  . COPD (chronic obstructive pulmonary disease) ([place])   . Coronary artery disease   . Anxiety   . Anxiety and depression 07/23/2015  . Chest pain 11/01/2014  . Dyslipidemia     Resolved Hospital Problems  No resolved problems to display.    Discharge Condition: stable  Diet recommendation: heart healthy  Filed Weights   01/27/18 0413 01/29/18 0510 01/30/18 0441  Weight: 54.8 kg (120 lb 12.8 oz) 54.3 kg (119 lb 12.8 oz) 51.1 kg (112 lb 11.2 oz)    History of present illness: (per admitting MD DR Tamala Julian) PCP: Ann Held, DO  Patient coming from: Home  Chief Complaint: Chest pain  I have personally briefly reviewed patient's old medical records in [place]   HPI: Angel [name] is a 84 y.o. female with medical history significant of HTN, dyslipidemia, CAD, PVCs, GERD, breast cancer s/p b/l mastectomy with chemoradiation; who presents with complaints of chest pain.  She normally experiences a burning pain across her chest that makes it sometimes hard for her to breathe when she exerts herself.  This pain has been present since  she was diagnosed with breast cancer and had a double mastectomy with radiation.  However, over the last 1-2 days symptoms have been constant and present even at rest which is not usual.  She even noted feeling short of breath at rest.  Patient noted associated symptoms of diaphoresis, lightheadedness, nausea, vomiting, and generalized fatigue.  Patient son thinks that the patient has been more fatigued over the last 1 to 2 weeks.  Denies having significant fever, leg swelling, change in weight, loss of consciousness, leg swelling, calf pain, recent travel, or falls.  Patient reports previously being seen by Dr. Golden Hurter in the past for PVCs.  ED Course: Upon admission into the emergency department patient was noted to be in A. fib with RVR with heart rates into the 150-160s.  She was noted to be tachypneic, but O2 saturations were maintained on room air.  Labs revealed CBC, CMP, magnesium, lipase, and troponin are relatively within normal limits.  BNP was noted to be mildly elevated at 450.  Patient was loaded with 10 mg of diltiazem and started on a drip.  [facility] called to admitFor suspected new onset A. fib with RVR.       Hospital Course:  Principal Problem:   Atrial fibrillation with RVR ([facility]) Active Problems:   Chest pain   Dyslipidemia   Anxiety and depression   Coronary artery disease   COPD (chronic obstructive pulmonary disease) ([facility])   Anxiety   Acute post-hemorrhagic anemia  Brief Narrative: 84yo F w/ a hx ofHTN, dyslipidemia,  CAD, PVCs, GERD, and breast cancer s/p Bmastectomies with chemo/radiation who presented with chest pain.She normally experiences a burning pain across her chest that makes it hard for her to breathe when she exerts herself. This pain has been present since she was diagnosed with breast cancer and had a double mastectomy with radiation. However, over the 1-2 days prior to this admit her symptoms became constant and present even at rest.  In the ED she  was noted to be in A. fib with RVR with heart ratesinto the 150-160s. She was noted to be tachypneic,but O2 saturations were maintained on room air. _________________________________________________________________________________ Significant Events: 4/26 admit  4/27 -TTE - 60-65% - no WMA  5/1 - hemoglobin was 5.1 - S/P Total of 4 U Of PRBC tx -->9.9 5/3/-EGD/colonoscopy    Newly diagnosed Afib w/ RVR (presenting symptom)-  -CHA2DS2 VASc score is 5  - planned TEE DCCV cancelled due to  she converted to normal sinus rhythm spontanouesly on 430 -remain in sinus rhythm on amiodarone, beta-blocker  -patient is not a candidate for chronic anticoagulation due to known dementia and GI bleed -Cardiology input appreciated. She is follow up with a fib clinic  Acute GI bleed, exacerbated possibly by heparin drip, severe hemorrhagic anemia secondary to GI bleed, acute blood loss anemia -Hemoglobin 12.0->9->5.6-->5.1 --->S/P Total of 4 U Of PRBC tx -->9.9-->10.2_>10, off anticoagulation -EGD + gastric ulcer, colonoscopy unremarkable, 5/3 -Continue ppi, avoids NSAIDS -[facility] input appreciated -she is to follow up with Dr Penelope Coop    Coronary artery disease-moderate nonobstructive coronary artery disease on cath 11/16/2014 with 50 to 70% stenosis of LAD -Continue medical management per record, beta-blocker, statins/zetia   HTN w/ episodes of modest hypotension (possible related to afib and gi bleed) BP well controlled at this time on lopressor  HLD Continue usual home medical therapy  ?Cognitive deficits - ?Dementia -Per RN "Pt appears to be forgetful and at times cannot remember that she's gotten an extra dose of xanax or received laxative and prune juice."  -Family confirmed memory issues in the past year, family also reports h/o hallucination at home, but continue to denies memory issues. - TSH normal  - B12 marginal therefore will replace - - folic acid normal -RPR/HIV  and ammonia level  Unremarkable -patient is to refer to neurology , further brain imaging per neurology  -She is not oriented to months   COPD Well compensated  Breast cancers/p bilateral mastectomy Chest wall recurrence s/p resection in 2015 - no recurrent of disease since 2015 - maintained on fulvestrant q month - also receives denosumab q20months  Chronic pain followed by Dr Nelva Bush - she reported Dr Nelva Bush recently increased her pain meds to q6hrs prn - Dr Jana Hakim also recently prescribed valium - pain appears well controlled at this time   DVT prophylaxis while in the hospital:was on IV heparin , than scd's Code Status:FULL CODE Family Communication:daughter [name] over the phone  Disposition Plan: SNF placement   Consultants:  Cardiology  [facility]  Antimicrobials: none    Discharge Exam: BP (!) 116/51 (BP Location: Right Arm)   Pulse 68   Temp (!) 97.5 F (36.4 C) (Oral)   Resp 18   Ht 5\' 2"  (1.575 m)   Wt 51.1 kg (112 lb 11.2 oz)   SpO2 (!) 83%   BMI 20.61 kg/m   General: NAD, not oriented to the month Cardiovascular: RRR Respiratory: CTABL  Discharge Instructions You were cared for by a hospitalist during your hospital  stay. If you have any questions about your discharge medications or the care you received while you were in the hospital after you are discharged, you can call the unit and asked to speak with the hospitalist on call if the hospitalist that took care of you is not available. Once you are discharged, your primary care physician will handle any further medical issues. Please note that NO REFILLS for any discharge medications will be authorized once you are discharged, as it is imperative that you return to your primary care physician (or establish a relationship with a primary care physician if you do not have one) for your aftercare needs so that they can reassess your need for medications and monitor your lab values.  Discharge  Instructions    Amb referral to AFIB Clinic   Complete by:  As directed    Diet - low sodium heart healthy   Complete by:  As directed    Increase activity slowly   Complete by:  As directed      Allergies as of 01/30/2018      Reactions   Adhesive [tape] Other (See Comments)   Pt prefers to use paper tape   Gabapentin Other (See Comments)   confusion   Penicillins Rash   Has patient had a PCN reaction causing immediate rash, facial/tongue/throat swelling, SOB or lightheadedness with hypotension: Yes Has patient had a PCN reaction causing severe rash involving mucus membranes or skin necrosis: No Has patient had a PCN reaction that required hospitalization No Has patient had a PCN reaction occurring within the last 10 years: No If all of the above answers are "NO", then may proceed with Cephalosporin use.      Medication List    STOP taking these medications   aspirin EC 81 MG tablet   B-COMPLEX PO   Biotin 5000 MCG Caps   Calcium-Magnesium-Zinc 1000-400-15 MG Tabs   celecoxib 200 MG capsule Commonly known as:  CELEBREX   citalopram 40 MG tablet Commonly known as:  CELEXA   HYDROcodone-acetaminophen 10-325 MG tablet Commonly known as:  NORCO   isosorbide mononitrate 30 MG 24 hr tablet Commonly known as:  IMDUR   VITAMIN E PO     TAKE these medications   albuterol 108 (90 Base) MCG/ACT inhaler Commonly known as:  PROVENTIL HFA;VENTOLIN HFA Inhale 1-2 puffs into the lungs every 4 (four) hours as needed for wheezing or shortness of breath.   amiodarone 200 MG tablet Commonly known as:  PACERONE Take 1 tablet (200 mg total) by mouth daily. Start taking on:  01/31/2018   ARTIFICIAL TEARS OP Place 1 drop into both eyes daily.   atorvastatin 20 MG tablet Commonly known as:  LIPITOR TAKE ONE TABLET BY MOUTH ONCE DAILY   budesonide-formoterol 160-4.5 MCG/ACT inhaler Commonly known as:  SYMBICORT Inhale 2 puffs into the lungs 2 (two) times daily as needed. What  changed:  reasons to take this   diazepam 5 MG tablet Commonly known as:  VALIUM Take 1 tablet (5 mg total) by mouth at bedtime as needed for anxiety. What changed:    how much to take  when to take this  reasons to take this   DULoxetine 60 MG capsule Commonly known as:  CYMBALTA TAKE 1 [place]. Need OV What changed:    how much to take  how to take this  when to take this  additional instructions   ezetimibe 10 MG tablet Commonly known  as:  ZETIA Take 1 tablet (10 mg total) by mouth daily.   Fish Oil Oil Take 1 capsule by mouth daily.   GROUND FLAX SEEDS PO Take 5 mLs by mouth daily.   metoprolol tartrate 25 MG tablet Commonly known as:  LOPRESSOR Take 0.5 tablets (12.5 mg total) by mouth 2 (two) times daily.   montelukast 10 MG tablet Commonly known as:  SINGULAIR Take 1 tablet (10 mg total) by mouth at bedtime. Need OV   multivitamin with minerals Tabs tablet Take 1 tablet by mouth daily.   nitroGLYCERIN 0.4 MG SL tablet Commonly known as:  NITROSTAT Place 1 tablet (0.4 mg total) under the tongue as needed. What changed:    when to take this  reasons to take this   ondansetron 4 MG disintegrating tablet Commonly known as:  ZOFRAN ODT Take 1 tablet (4 mg total) by mouth every 8 (eight) hours as needed for nausea or vomiting.   oxyCODONE-acetaminophen 5-325 MG tablet Commonly known as:  PERCOCET/ROXICET Take 1 tablet by mouth every 4 (four) hours as needed for severe pain. What changed:    when to take this  reasons to take this   pantoprazole 40 MG tablet Commonly known as:  PROTONIX Take 1 tablet (40 mg total) by mouth daily. Start taking on:  01/31/2018   polyethylene glycol packet Commonly known as:  MIRALAX / GLYCOLAX Take 17 g by mouth daily.   senna-docusate 8.6-50 MG tablet Commonly known as:  Senokot-S Take 1 tablet by mouth at bedtime.   vitamin B-12 500 MCG tablet Commonly known as:   CYANOCOBALAMIN Take 1 tablet (500 mcg total) by mouth daily.   Vitamin D-3 5000 units Tabs Take 5,000 Units by mouth daily.      Allergies  Allergen Reactions  . Adhesive [Tape] Other (See Comments)    Pt prefers to use paper tape  . Gabapentin Other (See Comments)    confusion  . Penicillins Rash    Has patient had a PCN reaction causing immediate rash, facial/tongue/throat swelling, SOB or lightheadedness with hypotension: Yes Has patient had a PCN reaction causing severe rash involving mucus membranes or skin necrosis: No Has patient had a PCN reaction that required hospitalization No Has patient had a PCN reaction occurring within the last 10 years: No If all of the above answers are "NO", then may proceed with Cephalosporin use.     Contact information for follow-up providers    Wonda Horner, MD Follow up in 1 month(s).   Specialty:  Gastroenterology Why:  follow up gastric ulcer Contact information: [address] [place] [address] [phone]        [facility] [employer]. Call.   Specialty:  Cardiology Why:  afib clinic Contact information: [address], [place] [phone]       [facility] Follow up in 1 month(s).   Why:  for memory impairment and cognitive eval Contact information: [address]     [address] [place] [address] [phone]       Suella Broad, MD Follow up in 3 week(s).   Specialty:  Physical Medicine and Rehabilitation Why:  chronic low back pain Contact information: [address] [place] [address] [phone]        Magrinat, Virgie Dad, MD Follow up.   Specialty:  Oncology Contact information: [place] [address] [phone]  [name], MD Follow up.   Specialty:  Cardiology Contact information: [address]  [address] [phone]            Contact information for after-discharge care    [facility] .   Service:  Skilled Nursing Contact information: [address] [place] [phone]                   The results of significant diagnostics from this hospitalization (including imaging, microbiology, ancillary and laboratory) are listed below for reference.    Significant Diagnostic Studies: Dg Chest Port 1 View  Result Date: 01/20/2018 CLINICAL DATA:  Chronic chest pain EXAM: PORTABLE CHEST 1 VIEW COMPARISON:  10/26/2017, 09/29/2016, CT 09/29/2016 FINDINGS: Stable elevation of the right diaphragm. No acute opacity or pleural effusion. Stable cardiomediastinal silhouette with aortic atherosclerosis. No pneumothorax. Scoliosis of the spine. IMPRESSION: No active disease.  Stable elevation of the right diaphragm. Electronically Signed   By: Donavan Foil M.D.   On: 01/20/2018 21:08    Microbiology: Recent Results (from the past 240 hour(s))  MRSA PCR Screening     Status: None   Collection Time: 01/20/18 10:18 PM  Result Value Ref Range Status   MRSA by PCR NEGATIVE NEGATIVE Final    Comment:        The GeneXpert MRSA Assay (FDA approved for NASAL specimens only), is one component of a comprehensive MRSA colonization surveillance program. It is not intended to diagnose MRSA infection nor to guide or monitor treatment for MRSA infections. Performed at [facility], [place] [address], [place],  [address]      Labs: Basic Metabolic Panel: Recent Labs  Lab 01/24/18 0447 01/25/18 0330 01/27/18 1043 01/28/18 0500 01/30/18 0357  NA 138 140 141 140 141  K 3.8 3.4* 3.5 3.7 3.4*  CL 102 110 107 105 104  CO2 27 27 27 28 27   GLUCOSE 88 86 89 135* 93  BUN 22* 21* <5* 8 5*  CREATININE 0.59 0.59 0.53 0.69 0.65  CALCIUM 8.5* 8.0* 8.2* 8.8* 8.6*  MG 2.0  --   --   --  2.1  PHOS 2.3*  --   --   --   --      Liver Function Tests: Recent Labs  Lab 01/24/18 0447 01/25/18 0330  AST 16 16  ALT 11* 11*  ALKPHOS 25* 36*  BILITOT 0.3 0.3  PROT 5.4* 4.5*  ALBUMIN 3.1* 2.6*   No results for input(s): LIPASE, AMYLASE in the last 168 hours. Recent Labs  Lab 01/25/18 0330  AMMONIA 12   CBC: Recent Labs  Lab 01/25/18 0330  01/25/18 0903 01/25/18 1849 01/26/18 0408 01/26/18 0638 01/27/18 1043 01/28/18 0500  WBC 6.3  --  5.8  --  6.2  --  5.7 6.5  HGB 5.6*   < > 5.4* 7.8* 10.3* 9.9* 10.2* 10.0*  HCT 17.4*   < > 17.3* 23.7* 30.9* 29.6* 31.5* 31.1*  MCV 93.5  --  93.5  --  87.3  --  89.5 90.1  PLT 181  --  189  --  153  --  191 215   < > = values in this interval not displayed.   Cardiac Enzymes: No results for input(s): CKTOTAL, CKMB, CKMBINDEX, TROPONINI in the last 168 hours. BNP: BNP (last 3 results) Recent Labs    01/20/18 1624  BNP 455.0*  ProBNP (last 3 results) No results for input(s): PROBNP in the last 8760 hours.  CBG: No results for input(s): GLUCAP in the last 168 hours.     Signed:  Florencia Reasons MD, PhD  [facility] 01/30/2018, 4:34 PM

## 2018-02-05 ENCOUNTER — Emergency Department (HOSPITAL_COMMUNITY): Payer: Medicare Other

## 2018-02-05 ENCOUNTER — Observation Stay (HOSPITAL_COMMUNITY)
Admission: EM | Admit: 2018-02-05 | Discharge: 2018-02-08 | Disposition: A | Discharge status: Skilled Nursing Facility | Payer: Medicare Other | Attending: Internal Medicine | Admitting: Internal Medicine

## 2018-02-05 ENCOUNTER — Encounter (HOSPITAL_COMMUNITY): Payer: Self-pay | Admitting: Emergency Medicine

## 2018-02-05 DIAGNOSIS — I208 Other forms of angina pectoris: Secondary | ICD-10-CM

## 2018-02-05 DIAGNOSIS — Z880 Allergy status to penicillin: Secondary | ICD-10-CM | POA: Diagnosis not present

## 2018-02-05 DIAGNOSIS — F329 Major depressive disorder, single episode, unspecified: Secondary | ICD-10-CM | POA: Insufficient documentation

## 2018-02-05 DIAGNOSIS — F0390 Unspecified dementia without behavioral disturbance: Secondary | ICD-10-CM | POA: Insufficient documentation

## 2018-02-05 DIAGNOSIS — R110 Nausea: Secondary | ICD-10-CM | POA: Diagnosis not present

## 2018-02-05 DIAGNOSIS — Z85828 Personal history of other malignant neoplasm of skin: Secondary | ICD-10-CM | POA: Insufficient documentation

## 2018-02-05 DIAGNOSIS — E785 Hyperlipidemia, unspecified: Secondary | ICD-10-CM | POA: Insufficient documentation

## 2018-02-05 DIAGNOSIS — G8929 Other chronic pain: Secondary | ICD-10-CM | POA: Diagnosis not present

## 2018-02-05 DIAGNOSIS — Z8249 Family history of ischemic heart disease and other diseases of the circulatory system: Secondary | ICD-10-CM | POA: Insufficient documentation

## 2018-02-05 DIAGNOSIS — I4891 Unspecified atrial fibrillation: Secondary | ICD-10-CM | POA: Insufficient documentation

## 2018-02-05 DIAGNOSIS — J4520 Mild intermittent asthma, uncomplicated: Secondary | ICD-10-CM

## 2018-02-05 DIAGNOSIS — J449 Chronic obstructive pulmonary disease, unspecified: Secondary | ICD-10-CM | POA: Diagnosis not present

## 2018-02-05 DIAGNOSIS — K259 Gastric ulcer, unspecified as acute or chronic, without hemorrhage or perforation: Secondary | ICD-10-CM | POA: Insufficient documentation

## 2018-02-05 DIAGNOSIS — K2100 Gastro-esophageal reflux disease with esophagitis, without bleeding: Secondary | ICD-10-CM | POA: Diagnosis present

## 2018-02-05 DIAGNOSIS — R079 Chest pain, unspecified: Secondary | ICD-10-CM | POA: Diagnosis not present

## 2018-02-05 DIAGNOSIS — Z79899 Other long term (current) drug therapy: Secondary | ICD-10-CM | POA: Insufficient documentation

## 2018-02-05 DIAGNOSIS — F419 Anxiety disorder, unspecified: Secondary | ICD-10-CM | POA: Diagnosis not present

## 2018-02-05 DIAGNOSIS — Z853 Personal history of malignant neoplasm of breast: Secondary | ICD-10-CM | POA: Insufficient documentation

## 2018-02-05 DIAGNOSIS — Z9013 Acquired absence of bilateral breasts and nipples: Secondary | ICD-10-CM | POA: Diagnosis not present

## 2018-02-05 DIAGNOSIS — K219 Gastro-esophageal reflux disease without esophagitis: Secondary | ICD-10-CM | POA: Diagnosis present

## 2018-02-05 DIAGNOSIS — I10 Essential (primary) hypertension: Secondary | ICD-10-CM | POA: Diagnosis present

## 2018-02-05 DIAGNOSIS — Z888 Allergy status to other drugs, medicaments and biological substances status: Secondary | ICD-10-CM | POA: Diagnosis not present

## 2018-02-05 DIAGNOSIS — Z87442 Personal history of urinary calculi: Secondary | ICD-10-CM | POA: Diagnosis not present

## 2018-02-05 DIAGNOSIS — R072 Precordial pain: Secondary | ICD-10-CM | POA: Diagnosis not present

## 2018-02-05 DIAGNOSIS — I2510 Atherosclerotic heart disease of native coronary artery without angina pectoris: Secondary | ICD-10-CM | POA: Insufficient documentation

## 2018-02-05 DIAGNOSIS — Z923 Personal history of irradiation: Secondary | ICD-10-CM | POA: Insufficient documentation

## 2018-02-05 DIAGNOSIS — R7303 Prediabetes: Secondary | ICD-10-CM | POA: Insufficient documentation

## 2018-02-05 DIAGNOSIS — R0602 Shortness of breath: Secondary | ICD-10-CM | POA: Diagnosis not present

## 2018-02-05 DIAGNOSIS — I340 Nonrheumatic mitral (valve) insufficiency: Secondary | ICD-10-CM | POA: Insufficient documentation

## 2018-02-05 DIAGNOSIS — R0789 Other chest pain: Secondary | ICD-10-CM | POA: Diagnosis not present

## 2018-02-05 DIAGNOSIS — M1990 Unspecified osteoarthritis, unspecified site: Secondary | ICD-10-CM | POA: Diagnosis not present

## 2018-02-05 LAB — CBC WITH DIFFERENTIAL/PLATELET
BASOS PCT: 0 %
Basophils Absolute: 0 10*3/uL (ref 0.0–0.1)
EOS ABS: 0 10*3/uL (ref 0.0–0.7)
Eosinophils Relative: 0 %
HCT: 36.9 % (ref 36.0–46.0)
Hemoglobin: 11.8 g/dL — ABNORMAL LOW (ref 12.0–15.0)
Lymphocytes Relative: 27 %
Lymphs Abs: 1.6 10*3/uL (ref 0.7–4.0)
MCH: 28.6 pg (ref 26.0–34.0)
MCHC: 32 g/dL (ref 30.0–36.0)
MCV: 89.3 fL (ref 78.0–100.0)
MONO ABS: 0.4 10*3/uL (ref 0.1–1.0)
MONOS PCT: 6 %
NEUTROS PCT: 67 %
Neutro Abs: 4.1 10*3/uL (ref 1.7–7.7)
Platelets: 349 10*3/uL (ref 150–400)
RBC: 4.13 MIL/uL (ref 3.87–5.11)
RDW: 13.6 % (ref 11.5–15.5)
WBC: 6.1 10*3/uL (ref 4.0–10.5)

## 2018-02-05 LAB — I-STAT CHEM 8, ED
BUN: 8 mg/dL (ref 6–20)
Calcium, Ion: 1.18 mmol/L (ref 1.15–1.40)
Chloride: 96 mmol/L — ABNORMAL LOW (ref 101–111)
Creatinine, Ser: 0.7 mg/dL (ref 0.44–1.00)
Glucose, Bld: 113 mg/dL — ABNORMAL HIGH (ref 65–99)
HEMATOCRIT: 36 % (ref 36.0–46.0)
HEMOGLOBIN: 12.2 g/dL (ref 12.0–15.0)
Potassium: 4.7 mmol/L (ref 3.5–5.1)
SODIUM: 131 mmol/L — AB (ref 135–145)
TCO2: 28 mmol/L (ref 22–32)

## 2018-02-05 LAB — BASIC METABOLIC PANEL
Anion gap: 8 (ref 5–15)
BUN: 6 mg/dL (ref 6–20)
CO2: 26 mmol/L (ref 22–32)
CREATININE: 0.78 mg/dL (ref 0.44–1.00)
Calcium: 9.1 mg/dL (ref 8.9–10.3)
Chloride: 97 mmol/L — ABNORMAL LOW (ref 101–111)
GFR calc Af Amer: >60 mL/min (ref >60)
Glucose, Bld: 116 mg/dL — ABNORMAL HIGH (ref 65–99)
Potassium: 4.4 mmol/L (ref 3.5–5.1)
SODIUM: 131 mmol/L — AB (ref 135–145)

## 2018-02-05 LAB — I-STAT TROPONIN, ED: TROPONIN I, POC: 0.01 ng/mL (ref 0.00–0.08)

## 2018-02-05 MED ORDER — ALPRAZOLAM 0.25 MG PO TABS
0.2500 mg | ORAL_TABLET | Freq: Two times a day (BID) | ORAL | Status: DC | PRN
  Administered 2018-02-05: 0.25 mg via ORAL
  Filled 2018-02-05: qty 1

## 2018-02-05 NOTE — H&P (Signed)
History and Physical    Angel [name] ZOX:[id number] DOB: 1932-09-04 DOA: 02/05/2018  Referring [name]/NP/PA: Dr Darl [name]  PCP: Angel [name]   Outpatient Specialists: Angel [name]   Patient coming from: Home  Chief Complaint: Chest Pain  HPI: Angel [name] is a 84 y.o. female with medical history significant of atrial fibrillation, anxiety disorder, breast cancer and hypertension who also has significant family history of coronary artery disease presenting with substernal chest pain on and off for about a month. Chest pain got worse today. It was described as pressure in the middle of the chest. Not worsened by anything. Relieved by rest. No nausea vomiting or diarrhea. Patient has significant history of GERD. She described this pain as different from her typical GERD pain. She was seen in the ER with normal EKG as well as negative initial enzymes. Due to risk factors his been admitted for workup.  ED Course:  Patient is slightly bradycardic with a heart rate of 56 otherwise vitals are stable. Sodium is 131. Hemoglobin 11.8.chest x-ray negative. EKG showed sinus bradycardia with a rate of 55  Review of Systems: As per HPI otherwise 10 point review of systems negative.    Past Medical History:  Diagnosis Date  . Anxiety   . Asthma   . Atrial fibrillation with RVR ([place]) 12/2017  . Breast cancer ([place]) 05/07/1999, 06/2011   a. s/p bilat mastectomies. recurrence 01/2014. Radiation, meds.  . Cancer of chest (wall) ([place])   . Cataract    eye surgery planned for 11/2014  . Chest wall recurrence of breast cancer ([place]) 2016  . Cholelithiasis   . Chronic bronchitis   . COPD (chronic obstructive pulmonary disease) ([place])   . Coronary artery disease Non-obstructive   a. 2012 Cath: LAD 40-58m->Med Rx;  b. 12/2013 Lexi MV: EF 74%, no ischemia. c. Cath 10/2014 - 50-70% mLAD but visually felt unchanged from prior, med rx recommended as sx were atypical.  . DDD (degenerative disc  disease)   . Degenerative joint disease   . Depression   . Dyslipidemia   . GERD (gastroesophageal reflux disease)   . Hx of radiation therapy 07/02/1999- 08/11/1999   right supraclavucular/axillary region: 5040 cGy, 28 fractions  . Hx of radiation therapy 02/20/14- 03/27/14   right chest wall 4500 cGy 25 sessions  . Hypertension   . Kidney stones    one stones  . Mitral regurgitation    a. mild-mod by echo 12/2017.  . Osteoarthritis   . Osteoporosis   . Pre-diabetes   . PVC's (premature ventricular contractions)   . Reflux esophagitis   . Skin cancer of face    non melanoma  . Spinal stenosis     Past Surgical History:  Procedure Laterality Date  . ABDOMINAL HYSTERECTOMY     in her 30's  . APPENDECTOMY    . BREAST BIOPSY Right 01/21/2014   Procedure: BREAST/CHEST WALL BIOPSY;  Surgeon: Angel [name];  Location: [facility];  Service: General;  Laterality: Right;  . BREAST LUMPECTOMY  2002   right breast  . CARDIAC CATHETERIZATION  03/2011, 3/15  . COLONOSCOPY WITH PROPOFOL N/A 01/27/2018   Procedure: COLONOSCOPY WITH PROPOFOL;  Surgeon: Angel [name];  Location: [facility] ENDOSCOPY;  Service: Endoscopy;  Laterality: N/A;  and EGD  . ESOPHAGOGASTRODUODENOSCOPY (EGD) WITH PROPOFOL N/A 01/27/2018   Procedure: ESOPHAGOGASTRODUODENOSCOPY (EGD) WITH PROPOFOL;  Surgeon: Angel [name];  Location: [facility] ENDOSCOPY;  Service: Endoscopy;  Laterality: N/A;  .  LEFT HEART CATHETERIZATION WITH CORONARY ANGIOGRAM N/A 12/05/2013   Procedure: LEFT HEART CATHETERIZATION WITH CORONARY ANGIOGRAM;  Surgeon: Angel [name];  Location[facility] CATH LAB;  Service: Cardiovascular;  Laterality: N/A;  . LEFT HEART CATHETERIZATION WITH CORONARY ANGIOGRAM N/A 11/06/2014   Procedure: LEFT HEART CATHETERIZATION WITH CORONARY ANGIOGRAM;  Surgeon: Angel [name];  Location: [facility] CATH LAB;  Service: Cardiovascular;  Laterality: N/A;  . MASTECTOMY  07/29/11   bilateral-rt nodes-none on left  . SMALL INTESTINE  SURGERY  1998   SBO  . TONSILLECTOMY    . TUBAL LIGATION  1961  . VEIN LIGATION AND STRIPPING Right   . VESICOVAGINAL FISTULA CLOSURE W/ TAH  age 30     reports that she has never smoked. She has never used smokeless tobacco. She reports that she drinks alcohol. She reports that she does not use drugs.  Allergies  Allergen Reactions  . Adhesive [Tape] Rash and Other (See Comments)    Pt prefers to use paper tape  . Gabapentin Other (See Comments)    Confusion   . Penicillins Rash    Has patient had a PCN reaction causing immediate rash, facial/tongue/throat swelling, SOB or lightheadedness with hypotension: Yes Has patient had a PCN reaction causing severe rash involving mucus membranes or skin necrosis: No Has patient had a PCN reaction that required hospitalization No Has patient had a PCN reaction occurring within the last 10 years: No If all of the above answers are "NO", then may proceed with Cephalosporin use.     Family History  Problem Relation Age of Onset  . Pancreatic cancer Mother   . Cancer Mother        pancreatic  . Asthma Daughter   . Stroke Daughter   . Hypertension Daughter   . Heart disease Father   . Emphysema Father   . Lymphoma Brother   . Asthma Brother   . Asthma Grandchild   . Asthma Brother        deceased  . Heart disease Brother     Prior to Admission medications   Medication Sig Start Date End Date Taking? Authorizing Provider  Cholecalciferol (VITAMIN D-3) 5000 UNITS TABS Take 5,000 Units by mouth daily.    Yes Provider, Historical, [name]  diazepam (VALIUM) 5 MG tablet Take 1 tablet (5 mg total) by mouth at bedtime as needed for anxiety. Patient taking differently: Take 5 mg by mouth 3 (three) times daily as needed for anxiety.  01/30/18  Yes Angel [name]  DULoxetine (CYMBALTA) 60 MG capsule TAKE 1 CAPSULE BY MOUTH  EVERY DAY FOR MOOD. Need OV Patient taking differently: Take 60 mg by mouth daily. FOR MOOD 12/14/17  Yes Angel [name]  Fish Oil OIL Take 1 capsule by mouth daily.    Yes Provider, Historical, [name]  Flaxseed, Linseed, (GROUND FLAX SEEDS PO) Sprinkle on cereal daily   Yes Provider, Historical, [name]  HYDROcodone-acetaminophen (NORCO) 10-325 MG tablet Take 1 tablet by mouth every 4 (four) hours.   Yes Provider, Historical, [name]  Hypromellose (ARTIFICIAL TEARS OP) Place 1 drop into both eyes 3 (three) times daily as needed (for dry eyes).    Yes Provider, Historical, [name]  montelukast (SINGULAIR) 10 MG tablet Take 1 tablet (10 mg total) by mouth at bedtime. Need OV Patient taking differently: Take 10 mg by mouth at bedtime.  12/14/17  Yes Ann Held, [name]  Multiple Vitamin (MULTIVITAMIN WITH MINERALS) TABS tablet Take  1 tablet by mouth daily.   Yes Provider, Historical, [name]  nitroGLYCERIN (NITROSTAT) 0.4 MG SL tablet Place 1 tablet (0.4 mg total) under the tongue as needed. Patient taking differently: Place 0.4 mg under the tongue every 5 (five) minutes as needed for chest pain.  11/23/17  Yes Turner, Eber Hong, [name]  ondansetron (ZOFRAN ODT) 4 MG disintegrating tablet Take 1 tablet (4 mg total) by mouth every 8 (eight) hours as needed for nausea or vomiting. 01/11/18  Yes Magrinat, Virgie Dad, [name]  senna-docusate (SENOKOT-S) 8.6-50 MG tablet Take 1 tablet by mouth at bedtime. Patient taking differently: Take 1 tablet by mouth at bedtime as needed for mild constipation.  01/30/18  Yes Florencia Reasons, [name]  albuterol (PROVENTIL HFA;VENTOLIN HFA) 108 (90 Base) MCG/ACT inhaler Inhale 1-2 puffs into the lungs every 4 (four) hours as needed for wheezing or shortness of breath. Patient not taking: Reported on 02/05/2018 09/29/16   Pisciotta, Elmyra Ricks, PA-C  amiodarone (PACERONE) 200 MG tablet Take 1 tablet (200 mg total) by mouth daily. 01/31/18   Florencia Reasons, [name]  atorvastatin (LIPITOR) 20 MG tablet TAKE ONE TABLET BY MOUTH ONCE DAILY Patient not taking: Reported on 02/05/2018 06/17/17   [name], [name]  budesonide-formoterol [facility])  160-4.5 MCG/ACT inhaler Inhale 2 puffs into the lungs 2 (two) times daily as needed. Patient not taking: Reported on 02/05/2018 05/03/16   Angel [name]  ezetimibe (ZETIA) 10 MG tablet Take 1 tablet (10 mg total) by mouth daily. Patient not taking: Reported on 02/05/2018 04/25/15   Angel [name]  metoprolol tartrate (LOPRESSOR) 25 MG tablet Take 0.5 tablets (12.5 mg total) by mouth 2 (two) times daily. 01/30/18   Florencia Reasons, [name]  oxyCODONE-acetaminophen (PERCOCET/ROXICET) 5-325 MG tablet Take 1 tablet by mouth every 4 (four) hours as needed for severe pain. Patient taking differently: Take 1 tablet by mouth every 4 (four) hours as needed for moderate pain or severe pain.  01/30/18   Angel [name]  pantoprazole (PROTONIX) 40 MG tablet Take 1 tablet (40 mg total) by mouth daily. 01/31/18   Florencia Reasons, [name]  polyethylene glycol [facility] / Floria Raveling) packet Take 17 g by mouth daily. 01/30/18   Florencia Reasons, [name]  vitamin B-12 (CYANOCOBALAMIN) 500 MCG tablet Take 1 tablet (500 mcg total) by mouth daily. 01/30/18   Florencia Reasons, [name]    Physical Exam: Vitals:   02/05/18 2045 02/05/18 2115 02/05/18 2145 02/05/18 2230  BP: 132/66 120/77 (!) 83/69 123/66  Pulse: (!) 59 (!) 58 (!) 55 (!) 56  Resp: 19 (!) 22 14 17   Temp:      TempSrc:      SpO2: 95% 100% 95% 95%  Weight:      Height:          Constitutional: NAD, calm, comfortable Vitals:   02/05/18 2045 02/05/18 2115 02/05/18 2145 02/05/18 2230  BP: 132/66 120/77 (!) 83/69 123/66  Pulse: (!) 59 (!) 58 (!) 55 (!) 56  Resp: 19 (!) 22 14 17   Temp:      TempSrc:      SpO2: 95% 100% 95% 95%  Weight:      Height:       Eyes: PERRL, lids and conjunctivae normal ENMT: Mucous membranes are moist. Posterior pharynx clear of any exudate or lesions.Normal dentition.  Neck: normal, supple, no masses, no thyromegaly Respiratory: clear to auscultation bilaterally, no wheezing, no crackles. Normal respiratory effort. No accessory muscle use.  Cardiovascular: Regular rate and rhythm, no murmurs / rubs / gallops. No extremity edema. 2+ pedal pulses. No carotid bruits.  Abdomen: no tenderness, no masses palpated. No hepatosplenomegaly. Bowel sounds positive.  Musculoskeletal: no clubbing / cyanosis. No joint deformity upper and lower extremities. Good ROM, no contractures. Normal muscle tone.  Skin: no rashes, lesions, ulcers. No induration Neurologic: CN 2-12 grossly intact. Sensation intact, DTR normal. Strength 5/5 in all 4.  Psychiatric: Normal judgment and insight. Alert and oriented x 3. Normal mood.   Labs on Admission: I have personally reviewed following labs and imaging studies  CBC: Recent Labs  Lab 02/05/18 2010 02/05/18 2022  WBC 6.1  --   NEUTROABS 4.1  --   HGB 11.8* 12.2  HCT 36.9 36.0  MCV 89.3  --   PLT 349  --    Basic Metabolic Panel: Recent Labs  Lab 01/30/18 0357 02/05/18 1957 02/05/18 2022  NA 141 131* 131*  K 3.4* 4.4 4.7  CL 104 97* 96*  CO2 27 26  --   GLUCOSE 93 116* 113*  BUN 5* 6 8  CREATININE 0.65 0.78 0.70  CALCIUM 8.6* 9.1  --   MG 2.1  --   --    GFR: Estimated Creatinine Clearance: 42.1 mL/min (by C-G formula based on SCr of 0.7 mg/dL). Liver Function Tests: No results for input(s): AST, ALT, ALKPHOS, BILITOT, PROT, ALBUMIN in the last 168 hours. No results for input(s): LIPASE, AMYLASE in the last 168 hours. No results for input(s): AMMONIA in the last 168 hours. Coagulation Profile: No results for input(s): INR, PROTIME in the last 168 hours. Cardiac Enzymes: No results for input(s): CKTOTAL, CKMB, CKMBINDEX, TROPONINI in the last 168 hours. BNP (last 3 results) No results for input(s): PROBNP in the last 8760 hours. HbA1C: No results for input(s): HGBA1C in the last 72 hours. CBG: No results for input(s): GLUCAP in the last 168 hours. Lipid Profile: No results for input(s): CHOL, HDL, LDLCALC, TRIG, CHOLHDL, LDLDIRECT in the last 72 hours. Thyroid Function  Tests: No results for input(s): TSH, T4TOTAL, FREET4, T3FREE, THYROIDAB in the last 72 hours. Anemia Panel: No results for input(s): VITAMINB12, FOLATE, FERRITIN, TIBC, IRON, RETICCTPCT in the last 72 hours. Urine analysis:    Component Value Date/Time   COLORURINE COLORLESS (A) 01/20/2018 1624   APPEARANCEUR CLEAR 01/20/2018 1624   LABSPEC 1.003 (L) 01/20/2018 1624   PHURINE 7.0 01/20/2018 1624   GLUCOSEU NEGATIVE 01/20/2018 1624   HGBUR NEGATIVE 01/20/2018 1624   BILIRUBINUR NEGATIVE 01/20/2018 1624   BILIRUBINUR neg 04/25/2015 1015   KETONESUR 5 (A) 01/20/2018 1624   PROTEINUR NEGATIVE 01/20/2018 1624   UROBILINOGEN 0.2 04/25/2015 1015   UROBILINOGEN 0.2 02/14/2015 2010   NITRITE NEGATIVE 01/20/2018 1624   LEUKOCYTESUR NEGATIVE 01/20/2018 1624   Sepsis Labs: @LABRCNTIP (procalcitonin:4,lacticidven:4) )No results found for this or any previous visit (from the past 240 hour(s)).   Radiological Exams on Admission: Dg Chest 2 View  Result Date: 02/05/2018 CLINICAL DATA:  Recurrent chest pain on the left side EXAM: CHEST - 2 VIEW COMPARISON:  01/20/2018 FINDINGS: Cardiac shadow is stable. Aortic calcifications are again seen. Elevation of the right hemidiaphragm is noted. No focal infiltrate or sizable effusion is seen. Mild degenerative changes of the thoracic spine are noted. IMPRESSION: No acute abnormality noted. Electronically Signed   By: Inez Catalina M.D.   On: 02/05/2018 18:57    EKG: Independently reviewed. Sinus bradycardia with a rate of 55. No significant ST changes  Assessment/Plan Principal Problem:   Chest pain Active Problems:   Essential hypertension   Dyslipidemia   Reflux esophagitis   GERD (gastroesophageal reflux disease)   COPD (chronic obstructive pulmonary disease) ([place])    #1 chest pain: Atypical in nature. Has been going on and off for about a month. Patient could also be due to GERD. Will admit patient for rule out MI. Check serial enzymes 3.  Nitroglycerin as well as oxygen and morphine as needed. Patient will require at least an echocardiogram in the morning. Possible cardiac stress testing.  #2 hypertension: Blood pressure is controlled. Will continue home regimen.  #3 GERD: Continue PPIs  #4 COPD without exacerbation: continue empiric oxygenation  #5 hyperlipidemia: Continue statin   DVT prophylaxis: Lovenox  Code Status: full Family Communication: none available  Disposition Plan: home  Consults called: none  Admission status: observation   Severity of Illness: The appropriate patient status for this patient is OBSERVATION. Observation status is judged to be reasonable and necessary in order to provide the required intensity of service to ensure the patient's safety. The patient's presenting symptoms, physical exam findings, and initial radiographic and laboratory data in the context of their medical condition is felt to place them at decreased risk for further clinical deterioration. Furthermore, it is anticipated that the patient will be medically stable for discharge from the hospital within 2 midnights of admission. The following factors support the patient status of observation.   " The patient's presenting symptoms include chest pain. " The physical exam findings include no abnormal findings. " The initial radiographic and laboratory data are normal chest x-ray and lab.     Barbette Merino [name] [facility] Pager [phone]  If 7PM-7AM, please contact night-coverage [email or web address] Password [facility]  02/05/2018, 11:09 PM

## 2018-02-05 NOTE — ED Triage Notes (Signed)
Pt brought in by EMS from home with complaints of CP and anxiety due to leaving a rehab facility 2 days ago AMA. Pt was diagnosed 4/26 with new onset Afib and a gastric ulcer and sent to rehab facility. EMS gave pt. 324mg  Aspirin and 3 SL NTG. Patient currently has no active CP. Ems alos gave 4mg  Zofran. Pt has no nausea

## 2018-02-05 NOTE — ED Provider Notes (Signed)
[place] EMERGENCY DEPARTMENT Provider Note   CSN: [id number] Arrival date & time: 02/05/18  1737     History   Chief Complaint Chief Complaint  Patient presents with  . Chest Pain    HPI Angel [name] is a 84 y.o. female.  The history is provided by the patient.  Chest Pain   This is a new problem. The current episode started less than 1 hour ago. The problem occurs rarely. The problem has been resolved. The pain is associated with exertion. The pain is present in the substernal region. The pain is moderate. The quality of the pain is described as pressure-like and brief. The pain does not radiate. Duration of episode(s) is 1 hour. The symptoms are aggravated by exertion. Associated symptoms include diaphoresis, nausea and shortness of breath. Pertinent negatives include no abdominal pain, no back pain, no cough, no fever, no palpitations and no vomiting. She has tried nitroglycerin for the symptoms. The treatment provided significant relief. Risk factors include being elderly.  Her past medical history is significant for hyperlipidemia and hypertension.  Pertinent negatives for past medical history include no seizures.    Past Medical History:  Diagnosis Date  . Anxiety   . Asthma   . Atrial fibrillation with RVR ([place]) 12/2017  . Breast cancer ([place]) 05/07/1999, 06/2011   a. s/p bilat mastectomies. recurrence 01/2014. Radiation, meds.  . Cancer of chest (wall) ([place])   . Cataract    eye surgery planned for 11/2014  . Chest wall recurrence of breast cancer ([place]) 2016  . Cholelithiasis   . Chronic bronchitis   . COPD (chronic obstructive pulmonary disease) ([place])   . Coronary artery disease Non-obstructive   a. 2012 Cath: LAD 40-76m->Med Rx;  b. 12/2013 Lexi MV: EF 74%, no ischemia. c. Cath 10/2014 - 50-70% mLAD but visually felt unchanged from prior, med rx recommended as sx were atypical.  . DDD (degenerative disc disease)   . Degenerative joint disease    . Depression   . Dyslipidemia   . GERD (gastroesophageal reflux disease)   . Hx of radiation therapy 07/02/1999- 08/11/1999   right supraclavucular/axillary region: 5040 cGy, 28 fractions  . Hx of radiation therapy 02/20/14- 03/27/14   right chest wall 4500 cGy 25 sessions  . Hypertension   . Kidney stones    one stones  . Mitral regurgitation    a. mild-mod by echo 12/2017.  . Osteoarthritis   . Osteoporosis   . Pre-diabetes   . PVC's (premature ventricular contractions)   . Reflux esophagitis   . Skin cancer of face    non melanoma  . Spinal stenosis     Patient Active Problem List   Diagnosis Date Noted  . Acute post-hemorrhagic anemia 01/25/2018  . Atrial fibrillation with RVR ([place]) 01/20/2018  . Spinal stenosis   . Skin cancer of face   . Reflux esophagitis   . PVC's (premature ventricular contractions)   . Pre-diabetes   . Osteoarthritis   . Kidney stones   . Hx of radiation therapy   . GERD (gastroesophageal reflux disease)   . Depression   . Degenerative joint disease   . Coronary artery disease   . COPD (chronic obstructive pulmonary disease) ([place])   . Cholelithiasis   . Cataract   . Cancer of chest (wall) ([place])   . Anxiety   . Malignant neoplasm of overlapping sites of right breast in female, estrogen receptor positive ([place]) 10/21/2016  . Osteoporosis 02/18/2016  .  Corneal scar, left eye 12/31/2015  . Frequent falls 08/02/2015  . DOE (dyspnea on exertion) 08/02/2015  . Anxiety and depression 07/23/2015  . Anemia of chronic disease 07/23/2015  . Pain in the chest   . Nuclear sclerosis of left eye 07/17/2015  . Cystoid macular edema of right eye 11/14/2014  . Chest pain 11/01/2014  . Dyslipidemia   . Chest wall recurrence of breast cancer ([place]) 09/27/2014  . Recurrent cancer of right breast ([place]) 05/29/2014  . Essential hypertension 05/01/2014  . Epiretinal membrane, right eye 08/22/2013  . Lamellar macular hole of right eye 08/22/2013  . Chronic pain  02/11/2011  . Asthma     Past Surgical History:  Procedure Laterality Date  . ABDOMINAL HYSTERECTOMY     in her 30's  . APPENDECTOMY    . BREAST BIOPSY Right 01/21/2014   Procedure: BREAST/CHEST WALL BIOPSY;  Surgeon: [name], MD;  Location: [facility];  Service: General;  Laterality: Right;  . BREAST LUMPECTOMY  2002   right breast  . CARDIAC CATHETERIZATION  03/2011, 3/15  . COLONOSCOPY WITH PROPOFOL N/A 01/27/2018   Procedure: COLONOSCOPY WITH PROPOFOL;  Surgeon: Wonda Horner, MD;  Location: [facility] ENDOSCOPY;  Service: Endoscopy;  Laterality: N/A;  and EGD  . ESOPHAGOGASTRODUODENOSCOPY (EGD) WITH PROPOFOL N/A 01/27/2018   Procedure: ESOPHAGOGASTRODUODENOSCOPY (EGD) WITH PROPOFOL;  Surgeon: Wonda Horner, MD;  Location: [facility] ENDOSCOPY;  Service: Endoscopy;  Laterality: N/A;  . LEFT HEART CATHETERIZATION WITH CORONARY ANGIOGRAM N/A 12/05/2013   Procedure: LEFT HEART CATHETERIZATION WITH CORONARY ANGIOGRAM;  Surgeon: [name], MD;  Location: [facility] CATH LAB;  Service: Cardiovascular;  Laterality: N/A;  . LEFT HEART CATHETERIZATION WITH CORONARY ANGIOGRAM N/A 11/06/2014   Procedure: LEFT HEART CATHETERIZATION WITH CORONARY ANGIOGRAM;  Surgeon: [name], MD;  Location: [facility] CATH LAB;  Service: Cardiovascular;  Laterality: N/A;  . MASTECTOMY  07/29/11   bilateral-rt nodes-none on left  . SMALL INTESTINE SURGERY  1998   SBO  . TONSILLECTOMY    . TUBAL LIGATION  1961  . VEIN LIGATION AND STRIPPING Right   . VESICOVAGINAL FISTULA CLOSURE W/ TAH  age 9     OB History   None      Home Medications    Prior to Admission medications   Medication Sig Start Date End Date Taking? Authorizing Provider  Cholecalciferol (VITAMIN D-3) 5000 UNITS TABS Take 5,000 Units by mouth daily.    Yes [provider]  diazepam (VALIUM) 5 MG tablet Take 1 tablet (5 mg total) by mouth at bedtime as needed for anxiety. Patient taking differently: Take 5 mg by mouth 3 (three) times daily  as needed for anxiety.  01/30/18  Yes Mariel Aloe, MD  DULoxetine (CYMBALTA) 60 MG capsule TAKE 1 CAPSULE BY MOUTH  EVERY DAY FOR MOOD. Need OV Patient taking differently: Take 60 mg by mouth daily. FOR MOOD 12/14/17  Yes Roma Schanz R, DO  Fish Oil OIL Take 1 capsule by mouth daily.    Yes [provider]  Flaxseed, Linseed, (GROUND FLAX SEEDS PO) Sprinkle on cereal daily   Yes [provider]  HYDROcodone-acetaminophen (NORCO) 10-325 MG tablet Take 1 tablet by mouth every 4 (four) hours.   Yes [provider]  Hypromellose (ARTIFICIAL TEARS OP) Place 1 drop into both eyes 3 (three) times daily as needed (for dry eyes).    Yes [provider]  montelukast (SINGULAIR) 10 MG tablet Take 1 tablet (10 mg total) by  mouth at bedtime. Need OV Patient taking differently: Take 10 mg by mouth at bedtime.  12/14/17  Yes Ann Held, DO  Multiple Vitamin (MULTIVITAMIN WITH MINERALS) TABS tablet Take 1 tablet by mouth daily.   Yes [provider]  nitroGLYCERIN (NITROSTAT) 0.4 MG SL tablet Place 1 tablet (0.4 mg total) under the tongue as needed. Patient taking differently: Place 0.4 mg under the tongue every 5 (five) minutes as needed for chest pain.  11/23/17  Yes Turner, Eber Hong, MD  ondansetron (ZOFRAN ODT) 4 MG disintegrating tablet Take 1 tablet (4 mg total) by mouth every 8 (eight) hours as needed for nausea or vomiting. 01/11/18  Yes Magrinat, Virgie Dad, MD  senna-docusate (SENOKOT-S) 8.6-50 MG tablet Take 1 tablet by mouth at bedtime. Patient taking differently: Take 1 tablet by mouth at bedtime as needed for mild constipation.  01/30/18  Yes Florencia Reasons, MD  albuterol (PROVENTIL HFA;VENTOLIN HFA) 108 (90 Base) MCG/ACT inhaler Inhale 1-2 puffs into the lungs every 4 (four) hours as needed for wheezing or shortness of breath. Patient not taking: Reported on 02/05/2018 09/29/16   Pisciotta, Elmyra Ricks, PA-C  amiodarone (PACERONE) 200 MG tablet Take 1 tablet  (200 mg total) by mouth daily. 01/31/18   Florencia Reasons, MD  atorvastatin (LIPITOR) 20 MG tablet TAKE ONE TABLET BY MOUTH ONCE DAILY Patient not taking: Reported on 02/05/2018 06/17/17   [name], MD  budesonide-formoterol [facility]) 160-4.5 MCG/ACT inhaler Inhale 2 puffs into the lungs 2 (two) times daily as needed. Patient not taking: Reported on 02/05/2018 05/03/16   Carollee Herter, Alferd Apa, DO  ezetimibe (ZETIA) 10 MG tablet Take 1 tablet (10 mg total) by mouth daily. Patient not taking: Reported on 02/05/2018 04/25/15   Roma Schanz R, DO  metoprolol tartrate (LOPRESSOR) 25 MG tablet Take 0.5 tablets (12.5 mg total) by mouth 2 (two) times daily. 01/30/18   Florencia Reasons, MD  oxyCODONE-acetaminophen (PERCOCET/ROXICET) 5-325 MG tablet Take 1 tablet by mouth every 4 (four) hours as needed for severe pain. Patient taking differently: Take 1 tablet by mouth every 4 (four) hours as needed for moderate pain or severe pain.  01/30/18   Mariel Aloe, MD  pantoprazole (PROTONIX) 40 MG tablet Take 1 tablet (40 mg total) by mouth daily. 01/31/18   Florencia Reasons, MD  polyethylene glycol [facility] Floria Raveling) packet Take 17 g by mouth daily. 01/30/18   Florencia Reasons, MD  vitamin B-12 (CYANOCOBALAMIN) 500 MCG tablet Take 1 tablet (500 mcg total) by mouth daily. 01/30/18   Florencia Reasons, MD    Family History Family History  Problem Relation Age of Onset  . Pancreatic cancer Mother   . Cancer Mother        pancreatic  . Asthma Daughter   . Stroke Daughter   . Hypertension Daughter   . Heart disease Father   . Emphysema Father   . Lymphoma Brother   . Asthma Brother   . Asthma Grandchild   . Asthma Brother        deceased  . Heart disease Brother     Social History Social History   Tobacco Use  . Smoking status: Never Smoker  . Smokeless tobacco: Never Used  Substance Use Topics  . Alcohol use: Yes    Comment: occ wine  . Drug use: No     Allergies   Adhesive [tape]; Gabapentin; and Penicillins   Review of  Systems Review of Systems  Constitutional: Positive for diaphoresis. Negative  for chills and fever.  HENT: Negative for ear pain and sore throat.   Eyes: Negative for pain and visual disturbance.  Respiratory: Positive for shortness of breath. Negative for cough.   Cardiovascular: Positive for chest pain. Negative for palpitations.  Gastrointestinal: Positive for nausea. Negative for abdominal pain and vomiting.  Genitourinary: Negative for dysuria and hematuria.  Musculoskeletal: Negative for arthralgias and back pain.  Skin: Negative for color change and rash.  Neurological: Negative for seizures and syncope.  All other systems reviewed and are negative.    Physical Exam Updated Vital Signs BP 123/66   Pulse (!) 56   Temp 98.1 F (36.7 C) (Oral)   Resp 17   Ht 5\' 2"  (1.575 m)   Wt 50.8 kg (112 lb)   SpO2 95%   BMI 20.49 kg/m   Physical Exam  Constitutional: She appears well-developed and well-nourished. No distress.  HENT:  Head: Normocephalic and atraumatic.  Eyes: Conjunctivae are normal.  Neck: Neck supple.  Cardiovascular: Regular rhythm and intact distal pulses. Bradycardia present.  No murmur heard. Pulmonary/Chest: Effort normal and breath sounds normal. No respiratory distress.  Abdominal: Soft. There is no tenderness.  Musculoskeletal: She exhibits no edema.       Right lower leg: Normal.       Left lower leg: Normal.  Neurological: She is alert.  Skin: Skin is warm and dry.  Psychiatric: She has a normal mood and affect.  Nursing note and vitals reviewed.    ED Treatments / Results  Labs (all labs ordered are listed, but only abnormal results are displayed) Labs Reviewed  CBC WITH DIFFERENTIAL/PLATELET - Abnormal; Notable for the following components:      Result Value   Hemoglobin 11.8 (*)    All other components within normal limits  BASIC METABOLIC PANEL - Abnormal; Notable for the following components:   Sodium 131 (*)    Chloride 97 (*)     Glucose, Bld 116 (*)    All other components within normal limits  I-STAT CHEM 8, ED - Abnormal; Notable for the following components:   Sodium 131 (*)    Chloride 96 (*)    Glucose, Bld 113 (*)    All other components within normal limits  I-STAT TROPONIN, ED    EKG EKG Interpretation  Date/Time:  Sunday Feb 05 2018 17:56:24 EDT Ventricular Rate:  55 PR Interval:    QRS Duration: 74 QT Interval:  450 QTC Calculation: 431 R Axis:   53 Text Interpretation:  Sinus rhythm No significant change since last tracing Confirmed by Wandra Arthurs [phone]) on 02/05/2018 7:59:53 PM   Radiology Dg Chest 2 View  Result Date: 02/05/2018 CLINICAL DATA:  Recurrent chest pain on the left side EXAM: CHEST - 2 VIEW COMPARISON:  01/20/2018 FINDINGS: Cardiac shadow is stable. Aortic calcifications are again seen. Elevation of the right hemidiaphragm is noted. No focal infiltrate or sizable effusion is seen. Mild degenerative changes of the thoracic spine are noted. IMPRESSION: No acute abnormality noted. Electronically Signed   By: Inez Catalina M.D.   On: 02/05/2018 18:57    Procedures Procedures (including critical care time)  Medications Ordered in ED Medications - No data to display   Initial Impression / Assessment and Plan / ED Course  I have reviewed the triage vital signs and the nursing notes.  Pertinent labs & imaging results that were available during my care of the patient were reviewed by me and considered in my medical decision  making (see chart for details).    Patient is an 84 year old female with history as above who presents due to chest pain that occurred just prior to arrival.  Her pain was exertional, associated with nausea, diaphoresis, and shortness of breath.  It resolved after she took aspirin and nitroglycerin with EMS.  She was here recently for A. fib with RVR, however her EKG shows sinus bradycardia here.  She has no acute ischemic changes on her EKG.  Her hear score is  elevated at 5.  Initial troponin negative.  Chest x-ray unremarkable.  Her pain has not returned here in the emergency department.  However, given her risk factors, she is being admitted to the hospitalist for further work-up of ACS.   Final Clinical Impressions(s) / ED Diagnoses   Final diagnoses:  Nonspecific chest pain    ED Discharge Orders    None       Clifton James, MD 02/05/18 2305    Drenda Freeze, MD 02/08/18 [phone]

## 2018-02-05 NOTE — ED Notes (Signed)
The pt is becoming  More confused

## 2018-02-05 NOTE — ED Notes (Signed)
Patient transported to x-ray. ?

## 2018-02-05 NOTE — ED Triage Notes (Addendum)
Per [facility]: Patient to ED from home c/o recurrent CP - states L sided pressure and tightness in chest onset while walking to her room about an hour ago with SOB, lightheadedness and nausea. Patient was seen for same recently, checked herself out of [facility] assisted living 3 days ago (was there less than 24 hours) because her roommate was making her feel very anxious. Patient does report some anxiety. No significant cardiac or medical hx per patient. Patient denies cough, no fevers/chills. 20g. PIV LAC, given 324 ASA and 3 NTG by EMS with some relief, also given 4mg  Zofran. Resp e/u, skin warm/dry.

## 2018-02-05 NOTE — ED Notes (Signed)
The pt just does not know the  Month everything else she is accurate

## 2018-02-05 NOTE — ED Notes (Signed)
The pt wants to leave  She reports she is tired

## 2018-02-06 ENCOUNTER — Ambulatory Visit (HOSPITAL_COMMUNITY): Payer: Medicare Other | Admitting: Nurse Practitioner

## 2018-02-06 ENCOUNTER — Other Ambulatory Visit: Payer: Self-pay

## 2018-02-06 ENCOUNTER — Encounter (HOSPITAL_COMMUNITY): Payer: Self-pay

## 2018-02-06 ENCOUNTER — Other Ambulatory Visit (HOSPITAL_COMMUNITY): Payer: Medicare Other

## 2018-02-06 DIAGNOSIS — R0789 Other chest pain: Secondary | ICD-10-CM | POA: Diagnosis not present

## 2018-02-06 DIAGNOSIS — K259 Gastric ulcer, unspecified as acute or chronic, without hemorrhage or perforation: Secondary | ICD-10-CM | POA: Diagnosis not present

## 2018-02-06 DIAGNOSIS — K253 Acute gastric ulcer without hemorrhage or perforation: Secondary | ICD-10-CM | POA: Diagnosis not present

## 2018-02-06 DIAGNOSIS — I10 Essential (primary) hypertension: Secondary | ICD-10-CM

## 2018-02-06 DIAGNOSIS — K219 Gastro-esophageal reflux disease without esophagitis: Secondary | ICD-10-CM | POA: Diagnosis not present

## 2018-02-06 DIAGNOSIS — E785 Hyperlipidemia, unspecified: Secondary | ICD-10-CM | POA: Diagnosis not present

## 2018-02-06 LAB — TROPONIN I
Troponin I: <0.03 ng/mL (ref <0.03)
Troponin I: <0.03 ng/mL (ref <0.03)

## 2018-02-06 MED ORDER — ENOXAPARIN SODIUM 40 MG/0.4ML ~~LOC~~ SOLN
40.0000 mg | SUBCUTANEOUS | Status: DC
  Administered 2018-02-06 – 2018-02-07 (×2): 40 mg via SUBCUTANEOUS
  Filled 2018-02-06 (×2): qty 0.4

## 2018-02-06 MED ORDER — AMIODARONE HCL 200 MG PO TABS
200.0000 mg | ORAL_TABLET | Freq: Every day | ORAL | Status: DC
  Administered 2018-02-06 – 2018-02-08 (×3): 200 mg via ORAL
  Filled 2018-02-06 (×3): qty 1

## 2018-02-06 MED ORDER — ONDANSETRON HCL 4 MG/2ML IJ SOLN
4.0000 mg | Freq: Four times a day (QID) | INTRAMUSCULAR | Status: DC | PRN
  Administered 2018-02-06: 4 mg via INTRAVENOUS
  Filled 2018-02-06: qty 2

## 2018-02-06 MED ORDER — VITAMIN D 1000 UNITS PO TABS
2000.0000 [IU] | ORAL_TABLET | Freq: Every day | ORAL | Status: DC
  Administered 2018-02-06 – 2018-02-08 (×3): 2000 [IU] via ORAL
  Filled 2018-02-06 (×5): qty 2

## 2018-02-06 MED ORDER — HYDROCODONE-ACETAMINOPHEN 10-325 MG PO TABS
1.0000 | ORAL_TABLET | ORAL | Status: DC | PRN
  Administered 2018-02-06 – 2018-02-08 (×8): 1 via ORAL
  Filled 2018-02-06 (×8): qty 1

## 2018-02-06 MED ORDER — DULOXETINE HCL 60 MG PO CPEP
60.0000 mg | ORAL_CAPSULE | Freq: Every day | ORAL | Status: DC
  Administered 2018-02-06 – 2018-02-08 (×3): 60 mg via ORAL
  Filled 2018-02-06 (×3): qty 1

## 2018-02-06 MED ORDER — POLYETHYLENE GLYCOL 3350 17 G PO PACK
17.0000 g | PACK | Freq: Every day | ORAL | Status: DC
  Administered 2018-02-06 – 2018-02-08 (×2): 17 g via ORAL
  Filled 2018-02-06 (×3): qty 1

## 2018-02-06 MED ORDER — MONTELUKAST SODIUM 10 MG PO TABS
10.0000 mg | ORAL_TABLET | Freq: Every day | ORAL | Status: DC
  Administered 2018-02-06 – 2018-02-07 (×2): 10 mg via ORAL
  Filled 2018-02-06 (×2): qty 1

## 2018-02-06 MED ORDER — OMEGA-3-ACID ETHYL ESTERS 1 G PO CAPS
1.0000 | ORAL_CAPSULE | Freq: Every day | ORAL | Status: DC
  Administered 2018-02-06 – 2018-02-08 (×3): 1 g via ORAL
  Filled 2018-02-06 (×3): qty 1

## 2018-02-06 MED ORDER — VITAMIN B-12 1000 MCG PO TABS
500.0000 ug | ORAL_TABLET | Freq: Every day | ORAL | Status: DC
  Administered 2018-02-06 – 2018-02-08 (×3): 500 ug via ORAL
  Filled 2018-02-06 (×3): qty 1

## 2018-02-06 MED ORDER — METOPROLOL TARTRATE 12.5 MG HALF TABLET: 12.5000 mg | ORAL_TABLET | Freq: Two times a day (BID) | ORAL | Status: DC

## 2018-02-06 MED ORDER — ACETAMINOPHEN 325 MG PO TABS
650.0000 mg | ORAL_TABLET | ORAL | Status: DC | PRN
  Administered 2018-02-07 (×2): 650 mg via ORAL
  Filled 2018-02-06 (×2): qty 2

## 2018-02-06 MED ORDER — PANTOPRAZOLE SODIUM 40 MG PO TBEC
40.0000 mg | DELAYED_RELEASE_TABLET | Freq: Every day | ORAL | Status: DC
  Administered 2018-02-06 – 2018-02-08 (×3): 40 mg via ORAL
  Filled 2018-02-06 (×3): qty 1

## 2018-02-06 MED ORDER — DIAZEPAM 5 MG PO TABS
5.0000 mg | ORAL_TABLET | Freq: Three times a day (TID) | ORAL | Status: DC | PRN
  Administered 2018-02-07: 5 mg via ORAL
  Filled 2018-02-06: qty 1

## 2018-02-06 MED ORDER — ADULT MULTIVITAMIN W/MINERALS CH
1.0000 | ORAL_TABLET | Freq: Every day | ORAL | Status: DC
  Administered 2018-02-06 – 2018-02-08 (×3): 1 via ORAL
  Filled 2018-02-06 (×3): qty 1

## 2018-02-06 NOTE — ED Notes (Signed)
Report given to rn on4e 

## 2018-02-06 NOTE — Progress Notes (Signed)
Pt arrived from the ED to [facility]. Pt given CHG bath, vital signs stable. Pt placed on telemetry. Pt orient to room and plan of care reviewed. Pt has no complaints at this time. Pt states "I just want to sleep". Will continue to follow.

## 2018-02-06 NOTE — ED Notes (Signed)
Purewick placed and pt informed of use.

## 2018-02-06 NOTE — Progress Notes (Signed)
Called daughter Freda Munro to update on pt condition and MD visit.  Daughter requested social worker d/t power of attorney and memory

## 2018-02-06 NOTE — Progress Notes (Signed)
PROGRESS NOTE    Angel [name]  DEY:[id number] DOB: [birth year] DOA: 02/05/2018 PCP: Ann Held, DO   Outpatient Specialists:     Brief Narrative:  Angel [name] is a 84 y.o. female with medical history significant of atrial fibrillation, anxiety disorder, breast cancer and hypertension who also has significant family history of coronary artery disease presenting with substernal chest pain on and off for about a month. Chest pain got worse today. It was described as pressure in the middle of the chest. Not worsened by anything. Relieved by rest. She apparently was recently hospitalized and went to SNF.  Left SNF AMA and never started meds for gastric ulcer.    Assessment & Plan:   Principal Problem:   Chest pain Active Problems:   Essential hypertension   Dyslipidemia   Reflux esophagitis   GERD (gastroesophageal reflux disease)   COPD (chronic obstructive pulmonary disease) (HCC)   chest pain: Atypical in nature.  -CE negative x 3 -recent EGD with ulcer -resume PPI (was not taking at home)   hypertension:  -monitor  GERD/gastric ulcer: Continue PPIs  COPD without exacerbation:   hyperlipidemia: Continue statin  Dementia -replacing B12 -social work consult -needs more family involvement    DVT prophylaxis:  Lovenox   Code Status: Full Code   Family Communication:   Disposition Plan:     Consultants:      Subjective: Chest wall tender Was not sure what meds she was supposed to be on at home   Objective: Vitals:   02/06/18 0115 02/06/18 0145 02/06/18 0248 02/06/18 1332  BP: (!) 106/51 (!) 105/54 (!) 110/56 (!) 96/51  Pulse: (!) 59 64 61 63  Resp: 16 17 14 19   Temp:   (!) 97.5 F (36.4 C) 98.1 F (36.7 C)  TempSrc:   Oral Oral  SpO2: 96% 98% 99%   Weight:   50.7 kg (111 lb 12.4 oz)   Height:   5\' 2"  (1.575 m)     Intake/Output Summary (Last 24 hours) at 02/06/2018 1446 Last data filed at 02/05/2018  2212 Gross per 24 hour  Intake -  Output 400 ml  Net -400 ml   Filed Weights   02/05/18 1800 02/06/18 0248  Weight: 50.8 kg (112 lb) 50.7 kg (111 lb 12.4 oz)    Examination:  General exam: Appears calm and comfortable  Respiratory system: Clear to auscultation. Respiratory effort normal. Cardiovascular system: S1 & S2 heard, RRR. Chest wall tender to palpation Gastrointestinal system: Abdomen is nondistended, soft and nontender. No organomegaly or masses felt. Normal bowel sounds heard. Central nervous system: Alert but confused to prior event and current events as well Extremities: Symmetric 5 x 5 power. Skin: No rashes, lesions or ulcers     Data Reviewed: I have personally reviewed following labs and imaging studies  CBC: Recent Labs  Lab 02/05/18 2010 02/05/18 2022  WBC 6.1  --   NEUTROABS 4.1  --   HGB 11.8* 12.2  HCT 36.9 36.0  MCV 89.3  --   PLT 349  --    Basic Metabolic Panel: Recent Labs  Lab 02/05/18 1957 02/05/18 2022  NA 131* 131*  K 4.4 4.7  CL 97* 96*  CO2 26  --   GLUCOSE 116* 113*  BUN 6 8  CREATININE 0.78 0.70  CALCIUM 9.1  --    GFR: Estimated Creatinine Clearance: 42.1 mL/min (by C-G formula based on SCr of 0.7 mg/dL). Liver Function Tests: No results for  input(s): AST, ALT, ALKPHOS, BILITOT, PROT, ALBUMIN in the last 168 hours. No results for input(s): LIPASE, AMYLASE in the last 168 hours. No results for input(s): AMMONIA in the last 168 hours. Coagulation Profile: No results for input(s): INR, PROTIME in the last 168 hours. Cardiac Enzymes: Recent Labs  Lab 02/06/18 0314 02/06/18 0553 02/06/18 0823  TROPONINI <0.03 <0.03 <0.03   BNP (last 3 results) No results for input(s): PROBNP in the last 8760 hours. HbA1C: No results for input(s): HGBA1C in the last 72 hours. CBG: No results for input(s): GLUCAP in the last 168 hours. Lipid Profile: No results for input(s): CHOL, HDL, LDLCALC, TRIG, CHOLHDL, LDLDIRECT in the last  72 hours. Thyroid Function Tests: No results for input(s): TSH, T4TOTAL, FREET4, T3FREE, THYROIDAB in the last 72 hours. Anemia Panel: No results for input(s): VITAMINB12, FOLATE, FERRITIN, TIBC, IRON, RETICCTPCT in the last 72 hours. Urine analysis:    Component Value Date/Time   COLORURINE COLORLESS (A) 01/20/2018 1624   APPEARANCEUR CLEAR 01/20/2018 1624   LABSPEC 1.003 (L) 01/20/2018 1624   PHURINE 7.0 01/20/2018 1624   GLUCOSEU NEGATIVE 01/20/2018 1624   HGBUR NEGATIVE 01/20/2018 1624   BILIRUBINUR NEGATIVE 01/20/2018 1624   BILIRUBINUR neg 04/25/2015 1015   KETONESUR 5 (A) 01/20/2018 1624   PROTEINUR NEGATIVE 01/20/2018 1624   UROBILINOGEN 0.2 04/25/2015 1015   UROBILINOGEN 0.2 02/14/2015 2010   NITRITE NEGATIVE 01/20/2018 1624   LEUKOCYTESUR NEGATIVE 01/20/2018 1624     )No results found for this or any previous visit (from the past 240 hour(s)).    Anti-infectives (From admission, onward)   None       Radiology Studies: Dg Chest 2 View  Result Date: 02/05/2018 CLINICAL DATA:  Recurrent chest pain on the left side EXAM: CHEST - 2 VIEW COMPARISON:  01/20/2018 FINDINGS: Cardiac shadow is stable. Aortic calcifications are again seen. Elevation of the right hemidiaphragm is noted. No focal infiltrate or sizable effusion is seen. Mild degenerative changes of the thoracic spine are noted. IMPRESSION: No acute abnormality noted. Electronically Signed   By: Inez Catalina M.D.   On: 02/05/2018 18:57        Scheduled Meds: . amiodarone  200 mg Oral Daily  . cholecalciferol  2,000 Units Oral Daily  . DULoxetine  60 mg Oral Daily  . enoxaparin (LOVENOX) injection  40 mg Subcutaneous Q24H  . montelukast  10 mg Oral QHS  . multivitamin with minerals  1 tablet Oral Daily  . omega-3 acid ethyl esters  1 capsule Oral Daily  . pantoprazole  40 mg Oral Daily  . polyethylene glycol  17 g Oral Daily  . vitamin B-12  500 mcg Oral Daily   Continuous Infusions:   LOS: 0 days     Time spent: 25 min    Geradine Girt, DO [facility] Pager [phone]  If 7PM-7AM, please contact night-coverage [email or web address] Password [facility] 02/06/2018, 2:46 PM

## 2018-02-06 NOTE — ED Notes (Signed)
Placed on bedpan  Again  Frequent voiding

## 2018-02-06 NOTE — ED Notes (Signed)
Xanax given when the pt asked for sleeping medicine

## 2018-02-06 NOTE — ED Notes (Signed)
Report called to rn on 4e 

## 2018-02-06 NOTE — Progress Notes (Signed)
Per MD visit this am.  D/C stress test and echo.

## 2018-02-07 DIAGNOSIS — K219 Gastro-esophageal reflux disease without esophagitis: Secondary | ICD-10-CM | POA: Diagnosis not present

## 2018-02-07 DIAGNOSIS — K259 Gastric ulcer, unspecified as acute or chronic, without hemorrhage or perforation: Secondary | ICD-10-CM | POA: Diagnosis not present

## 2018-02-07 DIAGNOSIS — R0789 Other chest pain: Secondary | ICD-10-CM | POA: Diagnosis not present

## 2018-02-07 DIAGNOSIS — I10 Essential (primary) hypertension: Secondary | ICD-10-CM | POA: Diagnosis not present

## 2018-02-07 LAB — CBC
HEMATOCRIT: 35.9 % — AB (ref 36.0–46.0)
Hemoglobin: 11.4 g/dL — ABNORMAL LOW (ref 12.0–15.0)
MCH: 29.2 pg (ref 26.0–34.0)
MCHC: 31.8 g/dL (ref 30.0–36.0)
MCV: 92.1 fL (ref 78.0–100.0)
Platelets: 322 10*3/uL (ref 150–400)
RBC: 3.9 MIL/uL (ref 3.87–5.11)
RDW: 14.4 % (ref 11.5–15.5)
WBC: 5 10*3/uL (ref 4.0–10.5)

## 2018-02-07 LAB — BASIC METABOLIC PANEL
ANION GAP: 7 (ref 5–15)
BUN: 9 mg/dL (ref 6–20)
CALCIUM: 8.7 mg/dL — AB (ref 8.9–10.3)
CO2: 30 mmol/L (ref 22–32)
CREATININE: 0.78 mg/dL (ref 0.44–1.00)
Chloride: 101 mmol/L (ref 101–111)
GFR calc Af Amer: >60 mL/min (ref >60)
GFR calc non Af Amer: >60 mL/min (ref >60)
GLUCOSE: 100 mg/dL — AB (ref 65–99)
Potassium: 4.2 mmol/L (ref 3.5–5.1)
Sodium: 138 mmol/L (ref 135–145)

## 2018-02-07 MED ORDER — DIAZEPAM 2 MG PO TABS
2.0000 mg | ORAL_TABLET | Freq: Three times a day (TID) | ORAL | Status: DC | PRN
  Administered 2018-02-07 – 2018-02-08 (×2): 2 mg via ORAL
  Filled 2018-02-07 (×2): qty 1

## 2018-02-07 MED ORDER — BISACODYL 10 MG RE SUPP: 10.0000 mg | Freq: Every day | RECTAL | Status: DC | PRN

## 2018-02-07 MED ORDER — FLEET ENEMA 7-19 GM/118ML RE ENEM
1.0000 | ENEMA | Freq: Every day | RECTAL | Status: DC | PRN
  Administered 2018-02-07: 1 via RECTAL
  Filled 2018-02-07: qty 1

## 2018-02-07 MED ORDER — SENNOSIDES-DOCUSATE SODIUM 8.6-50 MG PO TABS
2.0000 | ORAL_TABLET | Freq: Every day | ORAL | Status: DC | PRN
  Administered 2018-02-07: 2 via ORAL
  Filled 2018-02-07: qty 2

## 2018-02-07 NOTE — Progress Notes (Signed)
PROGRESS NOTE    [name]  RSW:[id number] DOB: [birth year] DOA: 02/05/2018 PCP: Ann Held, DO   Outpatient Specialists:     Brief Narrative:  Angel [name] is a 84 y.o. female with medical history significant of atrial fibrillation, anxiety disorder, breast cancer and hypertension who also has significant family history of coronary artery disease presenting with substernal chest pain on and off for about a month. Chest pain got worse today. It was described as pressure in the middle of the chest. Not worsened by anything. Relieved by rest. She apparently was recently hospitalized and went to SNF.  Left SNF AMA and never started meds for gastric ulcer.    Assessment & Plan:   Principal Problem:   Chest pain Active Problems:   Essential hypertension   Dyslipidemia   Reflux esophagitis   GERD (gastroesophageal reflux disease)   COPD (chronic obstructive pulmonary disease) (HCC)   chest pain: Atypical in nature.  -CE negative x 3 -recent EGD with ulcer -resume PPI (was not taking at home) -no pain today   hypertension:  -monitor  GERD/gastric ulcer: Continue PPIs  COPD without exacerbation:   hyperlipidemia: Continue statin  Dementia -replacing B12 -social work consult -patient does not currently have capacity- confused about medications (never took the PPI reccommended by GI for her gastric ulcer), timing (thinks she has been in the hospital for 1 week- very argumentative when corrected), not safe to go home alone.  Will need formal neurology evaluation upon discharge    DVT prophylaxis:  Lovenox   Code Status: Full Code   Family Communication: None at bedside  Disposition Plan:  SNF vs 24 hour supervision at home   Consultants:      Subjective: Thinks has been in the hospital for 1 week C/o not having a BM for >1 week   Objective: Vitals:   02/06/18 0145 02/06/18 0248 02/06/18 1332 02/07/18 0329  BP: (!)  105/54 (!) 110/56 (!) 96/51 119/71  Pulse: 64 61 63 70  Resp: 17 14 19 16   Temp:  (!) 97.5 F (36.4 C) 98.1 F (36.7 C) 98 F (36.7 C)  TempSrc:  Oral Oral Oral  SpO2: 98% 99%  94%  Weight:  50.7 kg (111 lb 12.4 oz)    Height:  5\' 2"  (1.575 m)      Intake/Output Summary (Last 24 hours) at 02/07/2018 1151 Last data filed at 02/07/2018 0938 Gross per 24 hour  Intake 240 ml  Output 1150 ml  Net -910 ml   Filed Weights   02/05/18 1800 02/06/18 0248  Weight: 50.8 kg (112 lb) 50.7 kg (111 lb 12.4 oz)    Examination:  General exam: irritable Respiratory system: no increased work of breathing Cardiovascular system: rrr Gastrointestinal system: +Bs, soft Central nervous system: awake but confused to time      Data Reviewed: I have personally reviewed following labs and imaging studies  CBC: Recent Labs  Lab 02/05/18 2010 02/05/18 2022 02/07/18 0324  WBC 6.1  --  5.0  NEUTROABS 4.1  --   --   HGB 11.8* 12.2 11.4*  HCT 36.9 36.0 35.9*  MCV 89.3  --  92.1  PLT 349  --  182   Basic Metabolic Panel: Recent Labs  Lab 02/05/18 1957 02/05/18 2022 02/07/18 0324  NA 131* 131* 138  K 4.4 4.7 4.2  CL 97* 96* 101  CO2 26  --  30  GLUCOSE 116* 113* 100*  BUN 6 8 9  CREATININE 0.78 0.70 0.78  CALCIUM 9.1  --  8.7*   GFR: Estimated Creatinine Clearance: 42.1 mL/min (by C-G formula based on SCr of 0.78 mg/dL). Liver Function Tests: No results for input(s): AST, ALT, ALKPHOS, BILITOT, PROT, ALBUMIN in the last 168 hours. No results for input(s): LIPASE, AMYLASE in the last 168 hours. No results for input(s): AMMONIA in the last 168 hours. Coagulation Profile: No results for input(s): INR, PROTIME in the last 168 hours. Cardiac Enzymes: Recent Labs  Lab 02/06/18 0314 02/06/18 0553 02/06/18 0823  TROPONINI <0.03 <0.03 <0.03   BNP (last 3 results) No results for input(s): PROBNP in the last 8760 hours. HbA1C: No results for input(s): HGBA1C in the last 72  hours. CBG: No results for input(s): GLUCAP in the last 168 hours. Lipid Profile: No results for input(s): CHOL, HDL, LDLCALC, TRIG, CHOLHDL, LDLDIRECT in the last 72 hours. Thyroid Function Tests: No results for input(s): TSH, T4TOTAL, FREET4, T3FREE, THYROIDAB in the last 72 hours. Anemia Panel: No results for input(s): VITAMINB12, FOLATE, FERRITIN, TIBC, IRON, RETICCTPCT in the last 72 hours. Urine analysis:    Component Value Date/Time   COLORURINE COLORLESS (A) 01/20/2018 1624   APPEARANCEUR CLEAR 01/20/2018 1624   LABSPEC 1.003 (L) 01/20/2018 1624   PHURINE 7.0 01/20/2018 1624   GLUCOSEU NEGATIVE 01/20/2018 1624   HGBUR NEGATIVE 01/20/2018 1624   BILIRUBINUR NEGATIVE 01/20/2018 1624   BILIRUBINUR neg 04/25/2015 1015   KETONESUR 5 (A) 01/20/2018 1624   PROTEINUR NEGATIVE 01/20/2018 1624   UROBILINOGEN 0.2 04/25/2015 1015   UROBILINOGEN 0.2 02/14/2015 2010   NITRITE NEGATIVE 01/20/2018 1624   LEUKOCYTESUR NEGATIVE 01/20/2018 1624     )No results found for this or any previous visit (from the past 240 hour(s)).    Anti-infectives (From admission, onward)   None       Radiology Studies: Dg Chest 2 View  Result Date: 02/05/2018 CLINICAL DATA:  Recurrent chest pain on the left side EXAM: CHEST - 2 VIEW COMPARISON:  01/20/2018 FINDINGS: Cardiac shadow is stable. Aortic calcifications are again seen. Elevation of the right hemidiaphragm is noted. No focal infiltrate or sizable effusion is seen. Mild degenerative changes of the thoracic spine are noted. IMPRESSION: No acute abnormality noted. Electronically Signed   By: Inez Catalina M.D.   On: 02/05/2018 18:57        Scheduled Meds: . amiodarone  200 mg Oral Daily  . cholecalciferol  2,000 Units Oral Daily  . DULoxetine  60 mg Oral Daily  . enoxaparin (LOVENOX) injection  40 mg Subcutaneous Q24H  . montelukast  10 mg Oral QHS  . multivitamin with minerals  1 tablet Oral Daily  . omega-3 acid ethyl esters  1 capsule  Oral Daily  . pantoprazole  40 mg Oral Daily  . polyethylene glycol  17 g Oral Daily  . vitamin B-12  500 mcg Oral Daily   Continuous Infusions:   LOS: 0 days    Time spent: 25 min    Geradine Girt, DO [facility] Pager [phone]  If 7PM-7AM, please contact night-coverage [email or web address] Password [facility] 02/07/2018, 11:51 AM

## 2018-02-07 NOTE — Evaluation (Signed)
Occupational Therapy Evaluation Patient Details Name: Angel [name] MRN: [id number] DOB: [birth year] Today's Date: 02/07/2018    History of Present Illness Pt is an 84 y.o. female admitted 02/05/18 with c/o substernal chest pain on and off for about one month. EKG showed sinus bradycardia. CE negative x3. PMH includes anxiety, a-fib, breast CA, HTN, COPD, GERD, dementia. Of note, pt recently admitted 4/26-5/6 with similar symptoms; d/c to SNF (per chart left SNF AMA).   Clinical Impression   PTA, pt was living alone and was performing ADLs, IADLs, and driving. Pt currently requiring Min Guard A for ADLs and functional mobility. Pt presenting with decreased cognition as seen by poor attention, safety awareness, and ST memory as well as decreased balance with several LOB during ADLs and mobility. Discussed recommendation for post-acute rehab with daughter and pt, and pt stating she is agreeable to rehab so she can rest and get stronger. Pt would benefit from further acute OT to facilitate safe dc. Recommend dc to SNF for further OT to optimize safety and independence with ADLs and feel pt would benefit from independent living facility in future.      Follow Up Recommendations  Supervision/Assistance - 24 hour;SNF    Equipment Recommendations  None recommended by OT    Recommendations for Other Services PT consult     Precautions / Restrictions Precautions Precautions: Fall Restrictions Weight Bearing Restrictions: No      Mobility Bed Mobility Overal bed mobility: Needs Assistance Bed Mobility: Supine to Sit;Sit to Supine     Supine to sit: Supervision Sit to supine: Supervision   General bed mobility comments: supervision for safety  Transfers Overall transfer level: Needs assistance Equipment used: None Transfers: Sit to/from Stand Sit to Stand: Min guard         General transfer comment: Min Guard for safety. LOB with head turns and highly distractable     Balance Overall balance assessment: Needs assistance Sitting-balance support: No upper extremity supported;Feet supported Sitting balance-Leahy Scale: Good     Standing balance support: No upper extremity supported;During functional activity Standing balance-Leahy Scale: Fair Standing balance comment: able to stand statically without UE support                           ADL either performed or assessed with clinical judgement   ADL Overall ADL's : Needs assistance/impaired Eating/Feeding: Set up;Sitting   Grooming: Standing;Cueing for sequencing;Min guard;Oral care Grooming Details (indicate cue type and reason): Pt requiring cues to terminate task repeating brushing her teeth 6 times. Pt with LOB and posterior lean Upper Body Bathing: Set up;Sitting;Supervision/ safety   Lower Body Bathing: Sit to/from stand;Min guard   Upper Body Dressing : Set up;Sitting;Supervision/safety   Lower Body Dressing: Sit to/from stand;Min guard Lower Body Dressing Details (indicate cue type and reason): donning socks at EOB Toilet Transfer: Ambulation;Min guard Toilet Transfer Details (indicate cue type and reason): Simulated in room         Functional mobility during ADLs: Min guard;Cane(Pt with several LOB during funcitonal mobility with headturn) General ADL Comments: Pt with decreased cognition (poor attention, awarness, memory, etc.) Pt with LOB during ADLs and functional mobility and present as a fall risk.      Vision Baseline Vision/History: No visual deficits Vision Assessment?: No apparent visual deficits     Perception     Praxis      Pertinent Vitals/Pain Pain Assessment: Faces Faces Pain Scale: Hurts little more Pain  Location: back Pain Descriptors / Indicators: Sore Pain Intervention(s): Monitored during session;Repositioned;RN gave pain meds during session     Hand Dominance Right   Extremity/Trunk Assessment Upper Extremity Assessment Upper Extremity  Assessment: Overall WFL for tasks assessed   Lower Extremity Assessment Lower Extremity Assessment: Overall WFL for tasks assessed   Cervical / Trunk Assessment Cervical / Trunk Assessment: Normal   Communication Communication Communication: No difficulties   Cognition Arousal/Alertness: Awake/alert Behavior During Therapy: WFL for tasks assessed/performed Overall Cognitive Status: History of cognitive impairments - at baseline Area of Impairment: Orientation;Attention;Memory;Safety/judgement;Awareness;Problem solving                 Orientation Level: Disoriented to;Time;Situation Current Attention Level: Sustained Memory: Decreased short-term memory   Safety/Judgement: Decreased awareness of safety;Decreased awareness of deficits Awareness: Intellectual Problem Solving: Slow processing;Requires verbal cues General Comments: Presenting with increased confusion. Significant ST memory deficits. Not recalling that RN provided pain medication 5 mintues prior. Requring cues to terminate oral care task (having repeated brushing teeth 6 times). Poor attention and highly distractable.    General Comments       Exercises     Shoulder Instructions      Home Living Family/patient expects to be discharged to:: Private residence Living Arrangements: Alone Available Help at Discharge: Family;Available PRN/intermittently Type of Home: Apartment Home Access: Stairs to enter Entrance Stairs-Number of Steps: 3 Entrance Stairs-Rails: Left Home Layout: One level     Bathroom Shower/Tub: Teacher, early years/pre: Standard     Home Equipment: Cane - single point;Tub bench;Shower seat          Prior Functioning/Environment Level of Independence: Independent        Comments: Pt reports total indep at home. She drives. Completes ADLs/IADLs independently. Uses SPC for longer community distances if needed. 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[employer])        OT Problem List:  Decreased activity tolerance;Impaired balance (sitting and/or standing);Decreased cognition;Decreased safety awareness;Cardiopulmonary status limiting activity      OT Treatment/Interventions: Self-care/ADL training;Therapeutic exercise;Energy conservation;DME and/or AE instruction;Therapeutic activities;Patient/family education;Balance training;Cognitive remediation/compensation    OT Goals(Current goals can be found in the care plan section) Acute Rehab OT Goals Patient Stated Goal: to go home OT Goal Formulation: With patient Time For Goal Achievement: 02/21/18 Potential to Achieve Goals: Good ADL Goals Pt Will Perform Grooming: with modified independence;standing Pt Will Transfer to Toilet: with modified independence;regular height toilet;ambulating Pt Will Perform Toileting - Clothing Manipulation and hygiene: with modified independence;sit to/from stand Additional ADL Goal #1: Pt will collect necessary ADL items with 1-2 cues Additional ADL Goal #2: (P) Pt will demonstrate safety  OT Frequency: Min 2X/week   Barriers to D/C:            Co-evaluation              AM-PAC PT "6 Clicks" Daily Activity     Outcome Measure Help from another person eating meals?: None Help from another person taking care of personal grooming?: A Little Help from another person toileting, which includes using toliet, bedpan, or urinal?: A Little Help from another person bathing (including washing, rinsing, drying)?: A Little Help from another person to put on and taking off regular upper body clothing?: A Little Help from another person to put on and taking off regular lower body clothing?: A Little 6 Click Score: 19   End of Session Equipment Utilized During Treatment: Other (comment);Gait belt(Cane) Nurse Communication: Mobility status  Activity Tolerance: Patient  tolerated treatment well Patient left: with call bell/phone within reach;in bed;with bed alarm set;with family/visitor  present(with social worker)  OT Visit Diagnosis: Other abnormalities of gait and mobility (R26.89);Other symptoms and signs involving cognitive function                Time: 7614-7092 OT Time Calculation (min): 24 min Charges:  OT General Charges $OT Visit: 1 Visit OT Evaluation $OT Eval Moderate Complexity: 1 Mod OT Treatments $Self Care/Home Management : 8-22 mins G-Codes:     Ader Fritze MSOT, OTR/L Acute Rehab Pager: [phone] Office: [place] 02/07/2018, 5:11 PM

## 2018-02-07 NOTE — Clinical Social Work Note (Signed)
Clinical Social Work Assessment  Patient Details  Name: Angel [name] MRN: [id number] Date of Birth: 1932-04-30  Date of referral:  02/07/18               Reason for consult:  Abuse/Neglect, Discharge Planning, Facility Placement                Permission sought to share information with:  Family Supports Permission granted to share information::  Yes, Verbal Permission Granted  Name::     Nicanor Alcon  Agency::  snf  Relationship::  daughter  Contact Information:  [phone]  Housing/Transportation Living arrangements for the past 2 months:  [place] of Information:  Patient, Adult Children Patient Interpreter Needed:  None Criminal Activity/Legal Involvement Pertinent to Current Situation/Hospitalization:  No - Comment as needed Significant Relationships:  Adult Children, Other Family Members Lives with:  Self Do you feel safe going back to the place where you live?  Yes Need for family participation in patient care:  Yes (Comment)  Care giving concerns:  Patient had daughter Freda Munro at bedside. Patient was very pleasant during assessment. Patient stated she lives at home by herself and all of her children are out of town. Freda Munro verified patients statement   Social Worker assessment / plan:  CSW met patient and family at bedside to offer support and discuss's discharge needs. Patient stated she has been in denial about her ability to care for herself. Patient stated "it is time to stop acting and let people know I need help". Patient stated she is agreeable to discharge to a rehab facility. CSW and patient went over the process of going to a SNF and patient stated she understands. Patient agreeable to discharge to [facility]  Freda Munro (pt daughter) stated that if she can have help from her siblings she will most likely take patient home with her to live permanently  Employment status:  Retired Insurance underwriter information:  Programmer, applications PT Recommendations:   [place] / Referral to community resources:  [place]  Patient/Family's Response to care: Patient understand that she will need to go to rehab to follow up with short term rehab  Patient/Family's Understanding of and Emotional Response to Diagnosis, Current Treatment, and Prognosis:  Patient and family are both agreeable with discharge plan Emotional Assessment Appearance:  Appears stated age Attitude/Demeanor/Rapport:  Charismatic, Engaged Affect (typically observed):  Accepting, Blunt, Pleasant Orientation:  Oriented to Self, Oriented to Situation, Oriented to Place Alcohol / Substance use:  Not Applicable Psych involvement (Current and /or in the community):  No (Comment)  Discharge Needs  Concerns to be addressed:  No discharge needs identified Readmission within the last 30 days:  Yes Current discharge risk:  Lives alone, Dependent with Mobility, Cognitively Impaired Barriers to Discharge:  [place], LCSW 02/07/2018, 5:48 PM

## 2018-02-07 NOTE — Evaluation (Addendum)
Physical Therapy Evaluation Patient Details Name: Angel [name] MRN: [id number] DOB: 13-Jun-1932 Today's Date: 02/07/2018   History of Present Illness  Pt is an 84 y.o. female admitted 02/05/18 with c/o substernal chest pain on and off for about one month. EKG showed sinus bradycardia. CE negative x3. PMH includes anxiety, a-fib, breast CA, HTN, COPD, GERD, dementia. Of note, pt recently admitted 4/26-5/6 with similar symptoms; d/c to SNF (per chart left SNF AMA).    Clinical Impression  Pt presents with an overall decrease in functional mobility secondary to above. PTA, pt reports indep with all functional mobility, including amb, ADLs/IADLs, and driving. Today, pt very distracted by needing to have BM with confusion regarding hospital length of stay and reason for admission. Requires min guard for transfers secondary to poor safety awareness, intermittent confusion, and decreased awareness, putting pt at increased risk for falls; adamantly declined amb outside of room. Pt reports family nearby, but no one available for 24/7 support; if this is the case, pt may benefit from short-term SNF-level therapies and potentially LTC/ALF (will likely refuse as she reportedly left SNF AMA). Will follow acutely to address established goals.    Follow Up Recommendations SNF;Supervision/Assistance - 24 hour(likely refusing SNF)    Equipment Recommendations  None recommended by PT    Recommendations for Other Services       Precautions / Restrictions Precautions Precautions: Fall Restrictions Weight Bearing Restrictions: No      Mobility  Bed Mobility Overal bed mobility: Independent             General bed mobility comments: Sitting EOB upon arrival. Return to supine indep  Transfers Overall transfer level: Needs assistance Equipment used: None Transfers: Sit to/from Stand Sit to Stand: Supervision         General transfer comment: Supervision for safety    Ambulation/Gait Ambulation/Gait assistance: Min guard Ambulation Distance (Feet): 2 Feet Assistive device: None Gait Pattern/deviations: Step-through pattern;Decreased stride length Gait velocity: Decreased   General Gait Details: Took steps from bed<>BSC with min guard, intermittent UE support on arm rests. Cues for safety to keep from getting tangled in lines/leads. Declined further distance  Stairs            Wheelchair Mobility    Modified Rankin (Stroke Patients Only)       Balance Overall balance assessment: Needs assistance Sitting-balance support: No upper extremity supported;Feet supported Sitting balance-Leahy Scale: Good     Standing balance support: No upper extremity supported;During functional activity Standing balance-Leahy Scale: Fair Standing balance comment: able to stand statically without UE support                             Pertinent Vitals/Pain Pain Assessment: No/denies pain Pain Intervention(s): Monitored during session    Home Living Family/patient expects to be discharged to:: Private residence Living Arrangements: Alone Available Help at Discharge: Family;Available PRN/intermittently Type of Home: Apartment Home Access: Stairs to enter Entrance Stairs-Rails: Left Entrance Stairs-Number of Steps: 3 Home Layout: One level Home Equipment: Cane - single point;Tub bench;Shower seat Additional Comments: No family present during session. She reports children live nearby, but not available for assistance    Prior Function Level of Independence: Independent         Comments: Pt reports total indep at home. She drives. Completes ADLs/IADLs independently. Uses SPC for longer community distances if needed. 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[employer])     [employer]  Extremity/Trunk Assessment   Upper Extremity Assessment Upper Extremity Assessment: Overall WFL for tasks assessed    Lower Extremity Assessment Lower  Extremity Assessment: Overall WFL for tasks assessed    Cervical / Trunk Assessment Cervical / Trunk Assessment: Normal  Communication   Communication: No difficulties  Cognition Arousal/Alertness: Awake/alert Behavior During Therapy: WFL for tasks assessed/performed Overall Cognitive Status: No family/caregiver present to determine baseline cognitive functioning Area of Impairment: Orientation;Attention;Memory;Safety/judgement;Awareness;Problem solving                 Orientation Level: Disoriented to;Time;Situation Current Attention Level: Sustained Memory: Decreased short-term memory   Safety/Judgement: Decreased awareness of safety;Decreased awareness of deficits Awareness: Intellectual Problem Solving: Slow processing;Requires verbal cues General Comments: Aware of date (by looking at calendar), but continues to think she has been in hospital "one week" despite reorientation to admission 5/12, only two days ago. Could not recall why she came to hospital or how she got here. Poor attention, seems internally distracted; also kept bringing up fact she has not had a BM since admission and RN needs to do something about it      General Comments      Exercises     Assessment/Plan    PT Assessment Patient needs continued PT services  PT Problem List Decreased activity tolerance;Decreased balance;Decreased mobility;Decreased cognition;Decreased knowledge of use of DME;Decreased safety awareness       PT Treatment Interventions DME instruction;Gait training;Stair training;Functional mobility training;Therapeutic activities;Therapeutic exercise;Balance training;Patient/family education;Cognitive remediation    PT Goals (Current goals can be found in the Care Plan section)  Acute Rehab PT Goals Patient Stated Goal: to go home PT Goal Formulation: With patient Time For Goal Achievement: 02/21/18 Potential to Achieve Goals: Good    Frequency Min 3X/week   Barriers to  discharge        Co-evaluation               AM-PAC PT "6 Clicks" Daily Activity  Outcome Measure Difficulty turning over in bed (including adjusting bedclothes, sheets and blankets)?: None Difficulty moving from lying on back to sitting on the side of the bed? : None Difficulty sitting down on and standing up from a chair with arms (e.g., wheelchair, bedside commode, etc,.)?: A Little Help needed moving to and from a bed to chair (including a wheelchair)?: A Little Help needed walking in hospital room?: A Little Help needed climbing 3-5 steps with a railing? : A Lot 6 Click Score: 19    End of Session   Activity Tolerance: Other (comment)(Limited by "need to have BM") Patient left: in chair;in bed;with bed alarm set;with nursing/sitter in room(RNs checking skin) Nurse Communication: Mobility status PT Visit Diagnosis: Other abnormalities of gait and mobility (R26.89);Unsteadiness on feet (R26.81)    Time: 6578-4696 PT Time Calculation (min) (ACUTE ONLY): 18 min   Charges:   PT Evaluation $PT Eval Moderate Complexity: 1 Mod     PT G Codes:       Mabeline Caras, PT, DPT Acute Rehab Services  Pager: [place] 02/07/2018, 9:50 AM

## 2018-02-07 NOTE — NC FL2 (Signed)
[place] LEVEL OF CARE SCREENING TOOL     IDENTIFICATION  Patient Name: Angel [name] Birthdate: 1932-01-13 Sex: female Admission Date (Current Location): 02/05/2018  [facility] [place] Number:  Herbalist and Address:  [facility], [place] [address], [place], [place] [address]      Provider Number: [id number]  Attending Physician Name and Address:  Geradine Girt, DO  Relative Name and Phone Number:  Nicanor Alcon    Current Level of Care: Hospital Recommended Level of Care:   Prior Approval Number:    Date Approved/Denied:   PASRR Number: [id number]  Discharge Plan: SNF    Current Diagnoses: Patient Active Problem List   Diagnosis Date Noted  . Acute post-hemorrhagic anemia 01/25/2018  . Atrial fibrillation with RVR ([place]) 01/20/2018  . Spinal stenosis   . Skin cancer of face   . Reflux esophagitis   . PVC's (premature ventricular contractions)   . Pre-diabetes   . Osteoarthritis   . Kidney stones   . Hx of radiation therapy   . GERD (gastroesophageal reflux disease)   . Depression   . Degenerative joint disease   . Coronary artery disease   . COPD (chronic obstructive pulmonary disease) ([place])   . Cholelithiasis   . Cataract   . Cancer of chest (wall) ([place])   . Anxiety   . Malignant neoplasm of overlapping sites of right breast in female, estrogen receptor positive ([place]) 10/21/2016  . Osteoporosis 02/18/2016  . Corneal scar, left eye 12/31/2015  . Frequent falls 08/02/2015  . DOE (dyspnea on exertion) 08/02/2015  . Anxiety and depression 07/23/2015  . Anemia of chronic disease 07/23/2015  . Pain in the chest   . Nuclear sclerosis of left eye 07/17/2015  . Cystoid macular edema of right eye 11/14/2014  . Chest pain 11/01/2014  . Dyslipidemia   . Chest wall recurrence of breast cancer ([place]) 09/27/2014  . Recurrent cancer of right breast ([place]) 05/29/2014  . Essential hypertension 05/01/2014  .  Epiretinal membrane, right eye 08/22/2013  . Lamellar macular hole of right eye 08/22/2013  . Chronic pain 02/11/2011  . Asthma     Orientation RESPIRATION BLADDER Height & Weight     Self, Time, Situation, Place  Normal Continent Weight: 111 lb 12.4 oz (50.7 kg) Height:  5\' 2"  (157.5 cm)  BEHAVIORAL SYMPTOMS/MOOD NEUROLOGICAL BOWEL NUTRITION STATUS      Continent Diet(see discharge summary)  AMBULATORY STATUS COMMUNICATION OF NEEDS Skin   Limited Assist Verbally Normal                       Personal Care Assistance Level of Assistance    Bathing Assistance: Limited assistance Feeding assistance: Independent Dressing Assistance: Limited assistance     Functional Limitations Info    Sight Info: Adequate Hearing Info: Adequate Speech Info: Adequate    SPECIAL CARE FACTORS FREQUENCY  PT (By licensed PT), OT (By licensed OT)     PT Frequency: 5x wk OT Frequency: 5x wk            Contractures Contractures Info: Not present    Additional Factors Info  Code Status, Allergies Code Status Info: full code Allergies Info: Adhesive tape, gabapentin, penicillins            Current Medications (02/07/2018):  This is the current hospital active medication list Current Facility-Administered Medications  Medication Dose Route Frequency Provider Last Rate Last Dose  .  acetaminophen (TYLENOL) tablet 650 mg  650 mg Oral Q4H PRN Elwyn Reach, MD   650 mg at 02/07/18 1148  . amiodarone (PACERONE) tablet 200 mg  200 mg Oral Daily Eulogio Bear U, DO   200 mg at 02/07/18 5885  . bisacodyl (DULCOLAX) suppository 10 mg  10 mg Rectal Daily PRN Eulogio Bear U, DO      . cholecalciferol (VITAMIN D) tablet 2,000 Units  2,000 Units Oral Daily Eulogio Bear U, DO   2,000 Units at 02/07/18 0917  . diazepam (VALIUM) tablet 2 mg  2 mg Oral TID PRN Eulogio Bear U, DO      . DULoxetine (CYMBALTA) DR capsule 60 mg  60 mg Oral Daily Eulogio Bear U, DO   60 mg at 02/07/18 0917  .  enoxaparin (LOVENOX) injection 40 mg  40 mg Subcutaneous Q24H Gala Romney L, MD   40 mg at 02/07/18 0919  . HYDROcodone-acetaminophen (NORCO) 10-325 MG per tablet 1 tablet  1 tablet Oral Q4H PRN Eulogio Bear U, DO   1 tablet at 02/07/18 1343  . montelukast (SINGULAIR) tablet 10 mg  10 mg Oral QHS Vann, Jessica U, DO   10 mg at 02/06/18 2046  . multivitamin with minerals tablet 1 tablet  1 tablet Oral Daily Eulogio Bear U, DO   1 tablet at 02/07/18 0917  . omega-3 acid ethyl esters (LOVAZA) capsule 1 g  1 capsule Oral Daily Eulogio Bear U, DO   1 g at 02/07/18 0917  . ondansetron (ZOFRAN) injection 4 mg  4 mg Intravenous Q6H PRN Elwyn Reach, MD   4 mg at 02/06/18 0902  . pantoprazole (PROTONIX) EC tablet 40 mg  40 mg Oral Daily Eulogio Bear U, DO   40 mg at 02/07/18 0917  . polyethylene glycol (MIRALAX / GLYCOLAX) packet 17 g  17 g Oral Daily Eulogio Bear U, DO   17 g at 02/06/18 0901  . senna-docusate (Senokot-S) tablet 2 tablet  2 tablet Oral Daily PRN Vertis Kelch, NP   2 tablet at 02/07/18 [phone]  . sodium phosphate (FLEET) 7-19 GM/118ML enema 1 enema  1 enema Rectal Daily PRN Eulogio Bear U, DO   1 enema at 02/07/18 0920  . vitamin B-12 (CYANOCOBALAMIN) tablet 500 mcg  500 mcg Oral Daily Eulogio Bear U, DO   500 mcg at 02/07/18 4128     Discharge Medications: Please see discharge summary for a list of discharge medications.  Relevant Imaging Results:  Relevant Lab Results:   Additional Information SS# [place], [place]

## 2018-02-08 ENCOUNTER — Inpatient Hospital Stay: Payer: Medicare Other

## 2018-02-08 ENCOUNTER — Telehealth: Payer: Self-pay | Admitting: Adult Health

## 2018-02-08 ENCOUNTER — Other Ambulatory Visit: Payer: Self-pay | Admitting: Family Medicine

## 2018-02-08 DIAGNOSIS — I4891 Unspecified atrial fibrillation: Secondary | ICD-10-CM | POA: Diagnosis not present

## 2018-02-08 DIAGNOSIS — K210 Gastro-esophageal reflux disease with esophagitis: Secondary | ICD-10-CM | POA: Diagnosis not present

## 2018-02-08 DIAGNOSIS — Z85828 Personal history of other malignant neoplasm of skin: Secondary | ICD-10-CM | POA: Diagnosis not present

## 2018-02-08 DIAGNOSIS — K253 Acute gastric ulcer without hemorrhage or perforation: Secondary | ICD-10-CM

## 2018-02-08 DIAGNOSIS — Z923 Personal history of irradiation: Secondary | ICD-10-CM | POA: Diagnosis not present

## 2018-02-08 DIAGNOSIS — E785 Hyperlipidemia, unspecified: Secondary | ICD-10-CM

## 2018-02-08 DIAGNOSIS — K259 Gastric ulcer, unspecified as acute or chronic, without hemorrhage or perforation: Secondary | ICD-10-CM | POA: Diagnosis not present

## 2018-02-08 DIAGNOSIS — R7303 Prediabetes: Secondary | ICD-10-CM | POA: Diagnosis not present

## 2018-02-08 DIAGNOSIS — R488 Other symbolic dysfunctions: Secondary | ICD-10-CM | POA: Diagnosis not present

## 2018-02-08 DIAGNOSIS — G894 Chronic pain syndrome: Secondary | ICD-10-CM | POA: Diagnosis not present

## 2018-02-08 DIAGNOSIS — R0789 Other chest pain: Secondary | ICD-10-CM | POA: Diagnosis not present

## 2018-02-08 DIAGNOSIS — I209 Angina pectoris, unspecified: Secondary | ICD-10-CM | POA: Diagnosis not present

## 2018-02-08 DIAGNOSIS — J449 Chronic obstructive pulmonary disease, unspecified: Secondary | ICD-10-CM | POA: Diagnosis not present

## 2018-02-08 DIAGNOSIS — M4800 Spinal stenosis, site unspecified: Secondary | ICD-10-CM | POA: Diagnosis not present

## 2018-02-08 DIAGNOSIS — Z888 Allergy status to other drugs, medicaments and biological substances status: Secondary | ICD-10-CM | POA: Diagnosis not present

## 2018-02-08 DIAGNOSIS — J986 Disorders of diaphragm: Secondary | ICD-10-CM | POA: Diagnosis not present

## 2018-02-08 DIAGNOSIS — Z87442 Personal history of urinary calculi: Secondary | ICD-10-CM | POA: Diagnosis not present

## 2018-02-08 DIAGNOSIS — D62 Acute posthemorrhagic anemia: Secondary | ICD-10-CM | POA: Diagnosis not present

## 2018-02-08 DIAGNOSIS — Z9013 Acquired absence of bilateral breasts and nipples: Secondary | ICD-10-CM | POA: Diagnosis not present

## 2018-02-08 DIAGNOSIS — R079 Chest pain, unspecified: Secondary | ICD-10-CM | POA: Diagnosis not present

## 2018-02-08 DIAGNOSIS — I480 Paroxysmal atrial fibrillation: Secondary | ICD-10-CM | POA: Diagnosis not present

## 2018-02-08 DIAGNOSIS — Z7401 Bed confinement status: Secondary | ICD-10-CM | POA: Diagnosis not present

## 2018-02-08 DIAGNOSIS — Z853 Personal history of malignant neoplasm of breast: Secondary | ICD-10-CM | POA: Diagnosis not present

## 2018-02-08 DIAGNOSIS — Z8249 Family history of ischemic heart disease and other diseases of the circulatory system: Secondary | ICD-10-CM | POA: Diagnosis not present

## 2018-02-08 DIAGNOSIS — Z79899 Other long term (current) drug therapy: Secondary | ICD-10-CM | POA: Diagnosis not present

## 2018-02-08 DIAGNOSIS — I10 Essential (primary) hypertension: Secondary | ICD-10-CM | POA: Diagnosis not present

## 2018-02-08 DIAGNOSIS — I340 Nonrheumatic mitral (valve) insufficiency: Secondary | ICD-10-CM | POA: Diagnosis not present

## 2018-02-08 DIAGNOSIS — R413 Other amnesia: Secondary | ICD-10-CM

## 2018-02-08 DIAGNOSIS — G8929 Other chronic pain: Secondary | ICD-10-CM | POA: Diagnosis not present

## 2018-02-08 DIAGNOSIS — Z880 Allergy status to penicillin: Secondary | ICD-10-CM | POA: Diagnosis not present

## 2018-02-08 DIAGNOSIS — M2550 Pain in unspecified joint: Secondary | ICD-10-CM | POA: Diagnosis not present

## 2018-02-08 DIAGNOSIS — M1990 Unspecified osteoarthritis, unspecified site: Secondary | ICD-10-CM | POA: Diagnosis not present

## 2018-02-08 DIAGNOSIS — M6281 Muscle weakness (generalized): Secondary | ICD-10-CM | POA: Diagnosis not present

## 2018-02-08 DIAGNOSIS — I2510 Atherosclerotic heart disease of native coronary artery without angina pectoris: Secondary | ICD-10-CM | POA: Diagnosis not present

## 2018-02-08 DIAGNOSIS — R262 Difficulty in walking, not elsewhere classified: Secondary | ICD-10-CM | POA: Diagnosis not present

## 2018-02-08 MED ORDER — LORAZEPAM 0.5 MG PO TABS
0.5000 mg | ORAL_TABLET | Freq: Once | ORAL | Status: AC
  Administered 2018-02-08: 0.5 mg via ORAL
  Filled 2018-02-08: qty 1

## 2018-02-08 MED ORDER — BUDESONIDE-FORMOTEROL FUMARATE 160-4.5 MCG/ACT IN AERO: 2.0000 | INHALATION_SPRAY | Freq: Two times a day (BID) | RESPIRATORY_TRACT | 1 refills | Status: AC

## 2018-02-08 MED ORDER — HYDROCODONE-ACETAMINOPHEN 10-325 MG PO TABS: 1.0000 | ORAL_TABLET | ORAL | 0 refills | Status: AC | PRN

## 2018-02-08 MED ORDER — DIAZEPAM 2 MG PO TABS: 2.0000 mg | ORAL_TABLET | Freq: Three times a day (TID) | ORAL | 0 refills | Status: DC | PRN

## 2018-02-08 MED ORDER — FLEET ENEMA 7-19 GM/118ML RE ENEM: 1.0000 | ENEMA | Freq: Every day | RECTAL | 0 refills | Status: DC | PRN

## 2018-02-08 MED ORDER — ACETAMINOPHEN 325 MG PO TABS: 650.0000 mg | ORAL_TABLET | ORAL | Status: AC | PRN

## 2018-02-08 NOTE — Progress Notes (Signed)
Physical Therapy Treatment Patient Details Name: Angel [name] MRN: [id number] DOB: [birth year] Today's Date: 02/08/2018    History of Present Illness Pt is an 84 y.o. female admitted 02/05/18 with c/o substernal chest pain on and off for about one month. EKG showed sinus bradycardia. CE negative x3. PMH includes anxiety, a-fib, breast CA, HTN, COPD, GERD, dementia. Of note, pt recently admitted 4/26-5/6 with similar symptoms; d/c to SNF (per chart left SNF AMA).    PT Comments    Pt making good progress. Pt agreeable to ST-SNF. Continue to recommend ST-SNF.    Follow Up Recommendations  SNF;Supervision/Assistance - 24 hour     Equipment Recommendations  None recommended by PT    Recommendations for Other Services       Precautions / Restrictions Precautions Precautions: Fall Restrictions Weight Bearing Restrictions: No    Mobility  Bed Mobility               General bed mobility comments: Pt sitting EOB  Transfers Overall transfer level: Needs assistance Equipment used: Straight cane Transfers: Sit to/from Stand Sit to Stand: Min guard         General transfer comment: Assist for safety  Ambulation/Gait Ambulation/Gait assistance: Min guard Ambulation Distance (Feet): 200 Feet Assistive device: Straight cane Gait Pattern/deviations: Step-through pattern;Decreased stride length;Narrow base of support   Gait velocity interpretation: >2.62 ft/sec, indicative of community ambulatory General Gait Details: Assist for safety   Stairs             Wheelchair Mobility    Modified Rankin (Stroke Patients Only)       Balance Overall balance assessment: Needs assistance Sitting-balance support: No upper extremity supported;Feet supported Sitting balance-Leahy Scale: Good     Standing balance support: No upper extremity supported;During functional activity Standing balance-Leahy Scale: Fair                               Cognition Arousal/Alertness: Awake/alert Behavior During Therapy: WFL for tasks assessed/performed Overall Cognitive Status: History of cognitive impairments - at baseline Area of Impairment: Orientation;Attention;Memory;Safety/judgement;Awareness                 Orientation Level: Disoriented to;Time;Situation Current Attention Level: Sustained Memory: Decreased short-term memory   Safety/Judgement: Decreased awareness of safety;Decreased awareness of deficits Awareness: Intellectual          Exercises      General Comments        Pertinent Vitals/Pain Pain Assessment: No/denies pain    Home Living                      Prior Function            PT Goals (current goals can now be found in the care plan section) Progress towards PT goals: Progressing toward goals    Frequency    Min 3X/week      PT Plan Current plan remains appropriate    Co-evaluation              AM-PAC PT "6 Clicks" Daily Activity  Outcome Measure  Difficulty turning over in bed (including adjusting bedclothes, sheets and blankets)?: None Difficulty moving from lying on back to sitting on the side of the bed? : None Difficulty sitting down on and standing up from a chair with arms (e.g., wheelchair, bedside commode, etc,.)?: A Little Help needed moving to and from a bed to chair (including a  wheelchair)?: A Little Help needed walking in hospital room?: A Little Help needed climbing 3-5 steps with a railing? : A Little 6 Click Score: 20    End of Session Equipment Utilized During Treatment: Gait belt Activity Tolerance: Patient tolerated treatment well Patient left: in chair;with call bell/phone within reach;with chair alarm set Nurse Communication: Mobility status PT Visit Diagnosis: Other abnormalities of gait and mobility (R26.89);Unsteadiness on feet (R26.81)     Time: 7129-2909 PT Time Calculation (min) (ACUTE ONLY): 16 min  Charges:  $Gait Training:  8-22 mins                    G Codes:       [facility] PT [place] 02/08/2018, 10:10 AM

## 2018-02-08 NOTE — Progress Notes (Signed)
Clinical Social Worker facilitated patient discharge including contacting patient family and facility to confirm patient discharge plans.  Clinical information faxed to facility and family agreeable with plan.  CSW arranged ambulance transport via [facility] to [facility]  .  RN to call [phone] (pt will go in rm# [phone]) for report prior to discharge.  Clinical Social Worker will sign off for now as social work intervention is no longer needed. Please consult [place] again if new need arises.  Rhea Pink, MSW, [place]

## 2018-02-08 NOTE — Clinical Social Work Placement (Signed)
   CLINICAL SOCIAL WORK PLACEMENT  NOTE  Date:  02/08/2018  Patient Details  Name: Angel [name] MRN: [id number] Date of Birth: 01/14/1932  Clinical Social Work is seeking post-discharge placement for this patient at the [place] level of care (*CSW will initial, date and re-position this form in  chart as items are completed):  Yes   Patient/family provided with [place] Work Department's list of facilities offering this level of care within the geographic area requested by the patient (or if unable, by the patient's family).  Yes   Patient/family informed of their freedom to choose among providers that offer the needed level of care, that participate in Medicare, Medicaid or managed care program needed by the patient, have an available bed and are willing to accept the patient.  Yes   Patient/family informed of 's ownership interest in [facility] and [facility], as well as of the fact that they are under no obligation to receive care at these facilities.  PASRR submitted to EDS on       PASRR number received on       Existing PASRR number confirmed on 02/08/18     FL2 transmitted to all facilities in geographic area requested by pt/family on 02/08/18     FL2 transmitted to all facilities within larger geographic area on       Patient informed that his/her managed care company has contracts with or will negotiate with certain facilities, including the following:        Yes   Patient/family informed of bed offers received.  Patient chooses bed at [facility]     Physician recommends and patient chooses bed at      Patient to be transferred to [facility] on 02/08/18.  Patient to be transferred to facility by ptar     Patient family notified on 02/08/18 of transfer.  Name of family member notified:  Nicanor Alcon, daughter      PHYSICIAN        Additional Comment:    _______________________________________________ Wende Neighbors, LCSW 02/08/2018, 10:02 AM

## 2018-02-08 NOTE — Discharge Summary (Signed)
Physician Discharge Summary  Angel [name]:[id number] DOB: [birth year] DOA: 02/05/2018  PCP: Ann Held, DO  Admit date: 02/05/2018 Discharge date: 02/08/2018   Recommendations for Outpatient Follow-Up:   1. Max of 4 grams of tylenol/day 2. Monitor B12 levels 3. Formal neurology evaluation for capacity-- currently not able to make own decisions    Discharge Diagnosis:   Principal Problem:   Chest pain Active Problems:   Essential hypertension   Dyslipidemia   Reflux esophagitis   GERD (gastroesophageal reflux disease)   COPD (chronic obstructive pulmonary disease) ([place])   Discharge disposition:   SNF:  Discharge Condition: Improved.  Diet recommendation: Low sodium, heart healthy   Wound care: None.   History of Present Illness:   Angel [name] is a 84 y.o. female with medical history significant of atrial fibrillation, anxiety disorder, breast cancer and hypertension who also has significant family history of coronary artery disease presenting with substernal chest pain on and off for about a month. Chest pain got worse today. It was described as pressure in the middle of the chest. Not worsened by anything. Relieved by rest. No nausea vomiting or diarrhea. Patient has significant history of GERD. She described this pain as different from her typical GERD pain. She was seen in the ER with normal EKG as well as negative initial enzymes. Due to risk factors his been admitted for workup.     Hospital Course by Problem:   chest pain:Atypical in nature.  -CE negative x 3 -recent EGD with ulcer -resume PPI (was not taking at home) -pain resolved   hypertension:  -monitor  GERD/gastric ulcer:Continue PPI -GI follow up for gastric ulcer  COPD without exacerbation  hyperlipidemia:  -Continue statin  Dementia -replacing B12 by mouth -social work consult for SNF -patient does not currently have capacity- confused about  medications (never took the PPI reccommended by GI for her gastric ulcer), timing (thinks she has been in the hospital for 1 week- very argumentative when corrected), not safe to go home alone.  Will need formal neurology evaluation upon discharge  Chronic pain/anxiety -decrease valium-- was taking more than prescribed at home -wean to just 1 pain medication--- had both norco and percocet on home [facility]        Medical Consultants:    None.   Discharge Exam:   Vitals:   02/07/18 2000 02/08/18 0644  BP: (!) 126/59 (!) 100/55  Pulse: 65 63  Resp: 14 13  Temp: 97.6 F (36.4 C) (!) 97.4 F (36.3 C)  SpO2: 100% 95%   Vitals:   02/07/18 1307 02/07/18 1309 02/07/18 2000 02/08/18 0644  BP:  (!) 101/57 (!) 126/59 (!) 100/55  Pulse:  72 65 63  Resp:  (!) 22 14 13   Temp:  97.6 F (36.4 C) 97.6 F (36.4 C) (!) 97.4 F (36.3 C)  TempSrc: Oral Oral Oral Oral  SpO2:  100% 100% 95%  Weight:      Height:        Gen:  Laying in bed with face covered   The results of significant diagnostics from this hospitalization (including imaging, microbiology, ancillary and laboratory) are listed below for reference.     Procedures and Diagnostic Studies:   Dg Chest 2 View  Result Date: 02/05/2018 CLINICAL DATA:  Recurrent chest pain on the left side EXAM: CHEST - 2 VIEW COMPARISON:  01/20/2018 FINDINGS: Cardiac shadow is stable. Aortic calcifications are again seen. Elevation of the right hemidiaphragm is noted.  No focal infiltrate or sizable effusion is seen. Mild degenerative changes of the thoracic spine are noted. IMPRESSION: No acute abnormality noted. Electronically Signed   By: Inez Catalina M.D.   On: 02/05/2018 18:57     Labs:   Basic Metabolic Panel: Recent Labs  Lab 02/05/18 1957 02/05/18 2022 02/07/18 0324  NA 131* 131* 138  K 4.4 4.7 4.2  CL 97* 96* 101  CO2 26  --  30  GLUCOSE 116* 113* 100*  BUN 6 8 9   CREATININE 0.78 0.70 0.78  CALCIUM 9.1  --  8.7*    GFR Estimated Creatinine Clearance: 42.1 mL/min (by C-G formula based on SCr of 0.78 mg/dL). Liver Function Tests: No results for input(s): AST, ALT, ALKPHOS, BILITOT, PROT, ALBUMIN in the last 168 hours. No results for input(s): LIPASE, AMYLASE in the last 168 hours. No results for input(s): AMMONIA in the last 168 hours. Coagulation profile No results for input(s): INR, PROTIME in the last 168 hours.  CBC: Recent Labs  Lab 02/05/18 2010 02/05/18 2022 02/07/18 0324  WBC 6.1  --  5.0  NEUTROABS 4.1  --   --   HGB 11.8* 12.2 11.4*  HCT 36.9 36.0 35.9*  MCV 89.3  --  92.1  PLT 349  --  322   Cardiac Enzymes: Recent Labs  Lab 02/06/18 0314 02/06/18 0553 02/06/18 0823  TROPONINI <0.03 <0.03 <0.03   BNP: Invalid input(s): POCBNP CBG: No results for input(s): GLUCAP in the last 168 hours. D-Dimer No results for input(s): DDIMER in the last 72 hours. Hgb A1c No results for input(s): HGBA1C in the last 72 hours. Lipid Profile No results for input(s): CHOL, HDL, LDLCALC, TRIG, CHOLHDL, LDLDIRECT in the last 72 hours. Thyroid function studies No results for input(s): TSH, T4TOTAL, T3FREE, THYROIDAB in the last 72 hours.  Invalid input(s): FREET3 Anemia work up No results for input(s): VITAMINB12, FOLATE, FERRITIN, TIBC, IRON, RETICCTPCT in the last 72 hours. Microbiology No results found for this or any previous visit (from the past 240 hour(s)).   Discharge Instructions:   Discharge Instructions    Diet - low sodium heart healthy   Complete by:  As directed    Discharge instructions   Complete by:  As directed    Max of 4 grams of tylenol/day Monitor B12 levels   Increase activity slowly   Complete by:  As directed      Allergies as of 02/08/2018      Reactions   Adhesive [tape] Rash, Other (See Comments)   Pt prefers to use paper tape   Gabapentin Other (See Comments)   Confusion   Penicillins Rash   Has patient had a PCN reaction causing immediate  rash, facial/tongue/throat swelling, SOB or lightheadedness with hypotension: Yes Has patient had a PCN reaction causing severe rash involving mucus membranes or skin necrosis: No Has patient had a PCN reaction that required hospitalization No Has patient had a PCN reaction occurring within the last 10 years: No If all of the above answers are "NO", then may proceed with Cephalosporin use.      Medication List    STOP taking these medications   metoprolol tartrate 25 MG tablet Commonly known as:  LOPRESSOR   oxyCODONE-acetaminophen 5-325 MG tablet Commonly known as:  PERCOCET/ROXICET     TAKE these medications   acetaminophen 325 MG tablet Commonly known as:  TYLENOL Take 2 tablets (650 mg total) by mouth every 4 (four) hours as needed for headache or  mild pain.   albuterol 108 (90 Base) MCG/ACT inhaler Commonly known as:  PROVENTIL HFA;VENTOLIN HFA Inhale 1-2 puffs into the lungs every 4 (four) hours as needed for wheezing or shortness of breath.   amiodarone 200 MG tablet Commonly known as:  PACERONE Take 1 tablet (200 mg total) by mouth daily.   ARTIFICIAL TEARS OP Place 1 drop into both eyes 3 (three) times daily as needed (for dry eyes).   atorvastatin 20 MG tablet Commonly known as:  LIPITOR TAKE ONE TABLET BY MOUTH ONCE DAILY   budesonide-formoterol 160-4.5 MCG/ACT inhaler Commonly known as:  SYMBICORT Inhale 2 puffs into the lungs 2 (two) times daily. What changed:    when to take this  reasons to take this   diazepam 2 MG tablet Commonly known as:  VALIUM Take 1 tablet (2 mg total) by mouth 3 (three) times daily as needed for anxiety. What changed:    medication strength  how much to take  when to take this   DULoxetine 60 MG capsule Commonly known as:  CYMBALTA TAKE 1 CAPSULE BY MOUTH  EVERY DAY FOR MOOD. Need OV What changed:    how much to take  how to take this  when to take this  additional instructions   ezetimibe 10 MG  tablet Commonly known as:  ZETIA Take 1 tablet (10 mg total) by mouth daily.   Fish Oil Oil Take 1 capsule by mouth daily.   GROUND FLAX SEEDS PO Sprinkle on cereal daily   HYDROcodone-acetaminophen 10-325 MG tablet Commonly known as:  NORCO Take 1 tablet by mouth every 4 (four) hours as needed for severe pain. What changed:    when to take this  reasons to take this   montelukast 10 MG tablet Commonly known as:  SINGULAIR Take 1 tablet (10 mg total) by mouth at bedtime. Need OV What changed:  additional instructions   multivitamin with minerals Tabs tablet Take 1 tablet by mouth daily.   nitroGLYCERIN 0.4 MG SL tablet Commonly known as:  NITROSTAT Place 1 tablet (0.4 mg total) under the tongue as needed. What changed:    when to take this  reasons to take this   ondansetron 4 MG disintegrating tablet Commonly known as:  ZOFRAN ODT Take 1 tablet (4 mg total) by mouth every 8 (eight) hours as needed for nausea or vomiting.   pantoprazole 40 MG tablet Commonly known as:  PROTONIX Take 1 tablet (40 mg total) by mouth daily.   polyethylene glycol packet Commonly known as:  MIRALAX / GLYCOLAX Take 17 g by mouth daily.   senna-docusate 8.6-50 MG tablet Commonly known as:  Senokot-S Take 1 tablet by mouth at bedtime. What changed:    when to take this  reasons to take this   sodium phosphate 7-19 GM/118ML Enem Place 133 mLs (1 enema total) rectally daily as needed for severe constipation.   vitamin B-12 500 MCG tablet Commonly known as:  CYANOCOBALAMIN Take 1 tablet (500 mcg total) by mouth daily.   Vitamin D-3 5000 units Tabs Take 5,000 Units by mouth daily.      Follow-up Information    Roma Schanz R, DO Follow up in 1 week(s).   Specialty:  Family Medicine Contact information: [place] [address] [address] [place] [address] [phone]        [name], MD .   Specialty:  Cardiology Contact information: [address] [place] [address] [phone]  Time coordinating discharge: 25 min  Signed:  Geradine Girt   [facility] 02/08/2018, 8:11 AM

## 2018-02-08 NOTE — Telephone Encounter (Signed)
Patients daughter called to cancel her appointment for 5/15 due to her being in the hospital per 5/14 phone msg

## 2018-02-10 ENCOUNTER — Emergency Department (HOSPITAL_COMMUNITY): Payer: Medicare Other

## 2018-02-10 ENCOUNTER — Emergency Department (HOSPITAL_COMMUNITY)
Admission: EM | Admit: 2018-02-10 | Discharge: 2018-02-10 | Disposition: A | Discharge status: Home / Self Care | Payer: Medicare Other | Attending: Emergency Medicine | Admitting: Emergency Medicine

## 2018-02-10 DIAGNOSIS — I10 Essential (primary) hypertension: Secondary | ICD-10-CM | POA: Diagnosis not present

## 2018-02-10 DIAGNOSIS — R079 Chest pain, unspecified: Secondary | ICD-10-CM | POA: Diagnosis not present

## 2018-02-10 DIAGNOSIS — I2510 Atherosclerotic heart disease of native coronary artery without angina pectoris: Secondary | ICD-10-CM | POA: Diagnosis not present

## 2018-02-10 DIAGNOSIS — J986 Disorders of diaphragm: Secondary | ICD-10-CM | POA: Diagnosis not present

## 2018-02-10 DIAGNOSIS — J449 Chronic obstructive pulmonary disease, unspecified: Secondary | ICD-10-CM | POA: Diagnosis not present

## 2018-02-10 DIAGNOSIS — Z79899 Other long term (current) drug therapy: Secondary | ICD-10-CM | POA: Diagnosis not present

## 2018-02-10 LAB — I-STAT TROPONIN, ED
Troponin i, poc: 0.01 ng/mL (ref 0.00–0.08)
Troponin i, poc: 0 ng/mL (ref 0.00–0.08)

## 2018-02-10 LAB — URINALYSIS, MICROSCOPIC (REFLEX)

## 2018-02-10 LAB — LIPASE, BLOOD: LIPASE: 23 U/L (ref 11–51)

## 2018-02-10 LAB — COMPREHENSIVE METABOLIC PANEL
ALK PHOS: 32 U/L — AB (ref 38–126)
ALT: 13 U/L — AB (ref 14–54)
AST: 21 U/L (ref 15–41)
Albumin: 3.7 g/dL (ref 3.5–5.0)
Anion gap: 9 (ref 5–15)
BILIRUBIN TOTAL: 0.3 mg/dL (ref 0.3–1.2)
BUN: 7 mg/dL (ref 6–20)
CO2: 27 mmol/L (ref 22–32)
CREATININE: 0.78 mg/dL (ref 0.44–1.00)
Calcium: 8.9 mg/dL (ref 8.9–10.3)
Chloride: 100 mmol/L — ABNORMAL LOW (ref 101–111)
GFR calc Af Amer: >60 mL/min (ref >60)
Glucose, Bld: 105 mg/dL — ABNORMAL HIGH (ref 65–99)
Potassium: 3.8 mmol/L (ref 3.5–5.1)
Sodium: 136 mmol/L (ref 135–145)
TOTAL PROTEIN: 6.2 g/dL — AB (ref 6.5–8.1)

## 2018-02-10 LAB — CBC
HEMATOCRIT: 35.8 % — AB (ref 36.0–46.0)
Hemoglobin: 11.3 g/dL — ABNORMAL LOW (ref 12.0–15.0)
MCH: 28.5 pg (ref 26.0–34.0)
MCHC: 31.6 g/dL (ref 30.0–36.0)
MCV: 90.4 fL (ref 78.0–100.0)
PLATELETS: 316 10*3/uL (ref 150–400)
RBC: 3.96 MIL/uL (ref 3.87–5.11)
RDW: 13.3 % (ref 11.5–15.5)
WBC: 5.6 10*3/uL (ref 4.0–10.5)

## 2018-02-10 LAB — URINALYSIS, ROUTINE W REFLEX MICROSCOPIC
Bilirubin Urine: NEGATIVE
GLUCOSE, UA: NEGATIVE mg/dL
KETONES UR: 15 mg/dL — AB
Nitrite: NEGATIVE
PROTEIN: NEGATIVE mg/dL
Specific Gravity, Urine: 1.01 (ref 1.005–1.030)
pH: 7.5 (ref 5.0–8.0)

## 2018-02-10 MED ORDER — GI COCKTAIL ~~LOC~~
30.0000 mL | Freq: Once | ORAL | Status: AC
  Administered 2018-02-10: 30 mL via ORAL
  Filled 2018-02-10: qty 30

## 2018-02-10 MED ORDER — ONDANSETRON 4 MG PO TBDP
4.0000 mg | ORAL_TABLET | Freq: Once | ORAL | Status: AC
  Administered 2018-02-10: 4 mg via ORAL
  Filled 2018-02-10: qty 1

## 2018-02-10 NOTE — ED Notes (Signed)
Pt brought out into hallway to sit in eyesight of nurses station so she does not wander off. Pt provided with water and graham crackers.

## 2018-02-10 NOTE — ED Triage Notes (Signed)
CC of chest pain. Pt describes chest pain as a burning sensation that begins from her stomach. GCS 14, alert to person and event, disoriented to time and place. Pt given three nitro by nursing home with some relief. EMS gave 324mg  aspirin and one more nitro. Pt on 2L of oxygen per baseline.

## 2018-02-10 NOTE — ED Notes (Signed)
Patient verbalizes understanding of discharge instructions. Opportunity for questioning and answers were provided. Armband removed by staff, pt discharged from ED with [facility].  

## 2018-02-10 NOTE — ED Provider Notes (Signed)
[facility] EMERGENCY DEPARTMENT Provider Note   CSN: [id number] Arrival date & time: 02/10/18  0559     History   Chief Complaint Chief Complaint  Patient presents with  . Chest Pain    HPI Angel [name] is a 84 y.o. female Who presents with cc of SOB.  She hasThe patient is a poor historian and appears to be confused with baseline dementia. HX is gathered from the patient and the chart. She was BIB EMS for Cp. Patient states that the pain began having pain about 1 to 2 hours before arrival.  She states that the pain is like pressure, burning radiating across the left chest.  She has associated nausea but denies sob, diaphoresis.    HPI  Past Medical History:  Diagnosis Date  . Anxiety   . Asthma   . Atrial fibrillation with RVR ([place]) 12/2017  . Breast cancer ([place]) 05/07/1999, 06/2011   a. s/p bilat mastectomies. recurrence 01/2014. Radiation, meds.  . Cancer of chest (wall) ([place])   . Cataract    eye surgery planned for 11/2014  . Chest wall recurrence of breast cancer ([place]) 2016  . Cholelithiasis   . Chronic bronchitis   . COPD (chronic obstructive pulmonary disease) ([place])   . Coronary artery disease Non-obstructive   a. 2012 Cath: LAD 40-73m->Med Rx;  b. 12/2013 Lexi MV: EF 74%, no ischemia. c. Cath 10/2014 - 50-70% mLAD but visually felt unchanged from prior, med rx recommended as sx were atypical.  . DDD (degenerative disc disease)   . Degenerative joint disease   . Depression   . Dyslipidemia   . GERD (gastroesophageal reflux disease)   . Hx of radiation therapy 07/02/1999- 08/11/1999   right supraclavucular/axillary region: 5040 cGy, 28 fractions  . Hx of radiation therapy 02/20/14- 03/27/14   right chest wall 4500 cGy 25 sessions  . Hypertension   . Kidney stones    one stones  . Mitral regurgitation    a. mild-mod by echo 12/2017.  . Osteoarthritis   . Osteoporosis   . Pre-diabetes   . PVC's (premature ventricular contractions)   . Reflux  esophagitis   . Skin cancer of face    non melanoma  . Spinal stenosis     Patient Active Problem List   Diagnosis Date Noted  . Acute post-hemorrhagic anemia 01/25/2018  . Atrial fibrillation with RVR ([place]) 01/20/2018  . Spinal stenosis   . Skin cancer of face   . Reflux esophagitis   . PVC's (premature ventricular contractions)   . Pre-diabetes   . Osteoarthritis   . Kidney stones   . Hx of radiation therapy   . GERD (gastroesophageal reflux disease)   . Depression   . Degenerative joint disease   . Coronary artery disease   . COPD (chronic obstructive pulmonary disease) ([place])   . Cholelithiasis   . Cataract   . Cancer of chest (wall) ([place])   . Anxiety   . Malignant neoplasm of overlapping sites of right breast in female, estrogen receptor positive ([place]) 10/21/2016  . Osteoporosis 02/18/2016  . Corneal scar, left eye 12/31/2015  . Frequent falls 08/02/2015  . DOE (dyspnea on exertion) 08/02/2015  . Anxiety and depression 07/23/2015  . Anemia of chronic disease 07/23/2015  . Pain in the chest   . Nuclear sclerosis of left eye 07/17/2015  . Cystoid macular edema of right eye 11/14/2014  . Chest pain 11/01/2014  . Dyslipidemia   . Chest wall recurrence  of breast cancer ([place]) 09/27/2014  . Recurrent cancer of right breast ([place]) 05/29/2014  . Essential hypertension 05/01/2014  . Epiretinal membrane, right eye 08/22/2013  . Lamellar macular hole of right eye 08/22/2013  . Chronic pain 02/11/2011  . Asthma     Past Surgical History:  Procedure Laterality Date  . ABDOMINAL HYSTERECTOMY     in her 30's  . APPENDECTOMY    . BREAST BIOPSY Right 01/21/2014   Procedure: BREAST/CHEST WALL BIOPSY;  Surgeon: [name], MD;  Location: [facility];  Service: General;  Laterality: Right;  . BREAST LUMPECTOMY  2002   right breast  . CARDIAC CATHETERIZATION  03/2011, 3/15  . COLONOSCOPY WITH PROPOFOL N/A 01/27/2018   Procedure: COLONOSCOPY WITH PROPOFOL;  Surgeon: Wonda Horner,  MD;  Location: [facility] ENDOSCOPY;  Service: Endoscopy;  Laterality: N/A;  and EGD  . ESOPHAGOGASTRODUODENOSCOPY (EGD) WITH PROPOFOL N/A 01/27/2018   Procedure: ESOPHAGOGASTRODUODENOSCOPY (EGD) WITH PROPOFOL;  Surgeon: Wonda Horner, MD;  Location[facility] ENDOSCOPY;  Service: Endoscopy;  Laterality: N/A;  . LEFT HEART CATHETERIZATION WITH CORONARY ANGIOGRAM N/A 12/05/2013   Procedure: LEFT HEART CATHETERIZATION WITH CORONARY ANGIOGRAM;  Surgeon: [name], MD;  Location: [facility] CATH LAB;  Service: Cardiovascular;  Laterality: N/A;  . LEFT HEART CATHETERIZATION WITH CORONARY ANGIOGRAM N/A 11/06/2014   Procedure: LEFT HEART CATHETERIZATION WITH CORONARY ANGIOGRAM;  Surgeon: [name], MD;  Location: [facility] CATH LAB;  Service: Cardiovascular;  Laterality: N/A;  . MASTECTOMY  07/29/11   bilateral-rt nodes-none on left  . SMALL INTESTINE SURGERY  1998   SBO  . TONSILLECTOMY    . TUBAL LIGATION  1961  . VEIN LIGATION AND STRIPPING Right   . VESICOVAGINAL FISTULA CLOSURE W/ TAH  age 3     OB History   None      Home Medications    Prior to Admission medications   Medication Sig Start Date End Date Taking? Authorizing Provider  acetaminophen (TYLENOL) 325 MG tablet Take 2 tablets (650 mg total) by mouth every 4 (four) hours as needed for headache or mild pain. 02/08/18  Yes Vann, Jessica U, DO  albuterol (PROVENTIL HFA;VENTOLIN HFA) 108 (90 Base) MCG/ACT inhaler Inhale 1-2 puffs into the lungs every 4 (four) hours as needed for wheezing or shortness of breath. 09/29/16  Yes Pisciotta, Elmyra Ricks, PA-C  amiodarone (PACERONE) 200 MG tablet Take 1 tablet (200 mg total) by mouth daily. 01/31/18  Yes Florencia Reasons, MD  atorvastatin (LIPITOR) 20 MG tablet TAKE ONE TABLET BY MOUTH ONCE DAILY 06/17/17  Yes Turner, Eber Hong, MD  budesonide-formoterol (SYMBICORT) 160-4.5 MCG/ACT inhaler Inhale 2 puffs into the lungs 2 (two) times daily. 02/08/18  Yes Geradine Girt, DO  Cholecalciferol (VITAMIN D-3) 5000 UNITS TABS  Take 5,000 Units by mouth daily.    Yes [provider]  diazepam (VALIUM) 2 MG tablet Take 1 tablet (2 mg total) by mouth 3 (three) times daily as needed for anxiety. 02/08/18  Yes Vann, Jessica U, DO  DULoxetine (CYMBALTA) 60 MG capsule TAKE 1 CAPSULE BY MOUTH  EVERY DAY FOR MOOD. Need OV Patient taking differently: Take 60 mg by mouth daily. FOR MOOD 12/14/17  Yes Roma Schanz R, DO  ezetimibe (ZETIA) 10 MG tablet Take 1 tablet (10 mg total) by mouth daily. 04/25/15  Yes Ann Held, DO  HYDROcodone-acetaminophen (NORCO) 10-325 MG tablet Take 1 tablet by mouth every 4 (four) hours as needed for severe pain. 02/08/18  Yes Eulogio Bear  U, DO  Hypromellose (ARTIFICIAL TEARS OP) Place 1 drop into both eyes 3 (three) times daily as needed (for dry eyes).    Yes [provider]  montelukast (SINGULAIR) 10 MG tablet Take 1 tablet (10 mg total) by mouth at bedtime. Need OV Patient taking differently: Take 10 mg by mouth at bedtime.  12/14/17  Yes Ann Held, DO  Multiple Vitamin (MULTIVITAMIN WITH MINERALS) TABS tablet Take 1 tablet by mouth daily.   Yes [provider]  nitroGLYCERIN (NITROSTAT) 0.4 MG SL tablet Place 1 tablet (0.4 mg total) under the tongue as needed. Patient taking differently: Place 0.4 mg under the tongue every 5 (five) minutes as needed for chest pain.  11/23/17  Yes Turner, Eber Hong, MD  Omega-3 Fatty Acids (FISH OIL) 1000 MG CAPS Take 1,000 mg by mouth daily.   Yes [provider]  ondansetron (ZOFRAN ODT) 4 MG disintegrating tablet Take 1 tablet (4 mg total) by mouth every 8 (eight) hours as needed for nausea or vomiting. 01/11/18  Yes Magrinat, Virgie Dad, MD  pantoprazole (PROTONIX) 40 MG tablet Take 1 tablet (40 mg total) by mouth daily. 01/31/18  Yes Florencia Reasons, MD  polyethylene glycol [facility] Floria Raveling) packet Take 17 g by mouth daily. 01/30/18  Yes Florencia Reasons, MD  senna-docusate (SENOKOT-S) 8.6-50 MG tablet Take 1 tablet by  mouth at bedtime. 01/30/18  Yes Florencia Reasons, MD  sodium phosphate (FLEET) 7-19 GM/118ML ENEM Place 133 mLs (1 enema total) rectally daily as needed for severe constipation. 02/08/18  Yes Geradine Girt, DO  vitamin B-12 (CYANOCOBALAMIN) 500 MCG tablet Take 1 tablet (500 mcg total) by mouth daily. 01/30/18  Yes Florencia Reasons, MD    Family History Family History  Problem Relation Age of Onset  . Pancreatic cancer Mother   . Cancer Mother        pancreatic  . Asthma Daughter   . Stroke Daughter   . Hypertension Daughter   . Heart disease Father   . Emphysema Father   . Lymphoma Brother   . Asthma Brother   . Asthma Grandchild   . Asthma Brother        deceased  . Heart disease Brother     Social History Social History   Tobacco Use  . Smoking status: Never Smoker  . Smokeless tobacco: Never Used  Substance Use Topics  . Alcohol use: Yes    Comment: occ wine  . Drug use: No     Allergies   Adhesive [tape]; Gabapentin; and Penicillins   Review of Systems Review of Systems  Unable to perform ROS: Mental status change     Physical Exam Updated Vital Signs BP (!) 116/58   Pulse 84   Temp 98.2 F (36.8 C) (Oral)   Resp (!) 22   Ht 5\' 2"  (1.575 m)   Wt 50.8 kg (112 lb)   SpO2 99%   BMI 20.49 kg/m   Physical Exam  Constitutional: She is oriented to person, place, and time. She appears well-developed and well-nourished. No distress.  HENT:  Head: Normocephalic and atraumatic.  Eyes: Conjunctivae are normal. No scleral icterus.  Neck: Normal range of motion.  Cardiovascular: Normal rate, regular rhythm and normal heart sounds. Exam reveals no gallop and no friction rub.  No murmur heard. Pulmonary/Chest: Effort normal and breath sounds normal. No respiratory distress.  Abdominal: Soft. Bowel sounds are normal. She exhibits no distension and no mass. There is no tenderness. There is  no guarding.  Neurological: She is alert and oriented to person, place, and time.  Skin:  Skin is warm and dry. She is not diaphoretic.  Psychiatric: Her behavior is normal.  Nursing note and vitals reviewed.    ED Treatments / Results  Labs (all labs ordered are listed, but only abnormal results are displayed) Labs Reviewed  CBC - Abnormal; Notable for the following components:      Result Value   Hemoglobin 11.3 (*)    HCT 35.8 (*)    All other components within normal limits  COMPREHENSIVE METABOLIC PANEL - Abnormal; Notable for the following components:   Chloride 100 (*)    Glucose, Bld 105 (*)    Total Protein 6.2 (*)    ALT 13 (*)    Alkaline Phosphatase 32 (*)    All other components within normal limits  URINALYSIS, ROUTINE W REFLEX MICROSCOPIC - Abnormal; Notable for the following components:   Hgb urine dipstick SMALL (*)    Ketones, ur 15 (*)    Leukocytes, UA MODERATE (*)    All other components within normal limits  URINALYSIS, MICROSCOPIC (REFLEX) - Abnormal; Notable for the following components:   Bacteria, UA RARE (*)    All other components within normal limits  LIPASE, BLOOD  I-STAT TROPONIN, ED  I-STAT TROPONIN, ED    EKG EKG Interpretation  Date/Time:  Friday Feb 10 2018 06:15:32 EDT Ventricular Rate:  74 PR Interval:    QRS Duration: 70 QT Interval:  422 QTC Calculation: 469 R Axis:   52 Text Interpretation:  Sinus rhythm No significant change since last tracing Confirmed by Duffy Bruce [phone]) on 02/10/2018 7:01:01 AM   Radiology Dg Chest 2 View  Result Date: 02/10/2018 CLINICAL DATA:  Acute onset of burning sensation at the epigastric region. EXAM: CHEST - 2 VIEW COMPARISON:  Chest radiograph performed 02/05/2018 FINDINGS: The lungs are well-aerated. There is mild elevation of the right hemidiaphragm. There is no evidence of focal opacification, pleural effusion or pneumothorax. The heart is normal in size; the mediastinal contour is within normal limits. No acute osseous abnormalities are seen. Clips are noted at the axilla  bilaterally. IMPRESSION: Mild elevation of the right hemidiaphragm. Lungs remain grossly clear. Electronically Signed   By: Garald Balding M.D.   On: 02/10/2018 06:51    Procedures Procedures (including critical care time)  Medications Ordered in ED Medications  ondansetron (ZOFRAN-ODT) disintegrating tablet 4 mg (4 mg Oral Given 02/10/18 0711)  gi cocktail (Maalox,Lidocaine,Donnatal) (30 mLs Oral Given 02/10/18 0720)   Patient with 2- troponins, EKG unchanged from previous.  She had admission for ulcer and has baseline dementia.  She is complaining of chest pain and nausea which resolved with GI cocktail Zofran.  Doubt cardiac etiology.  She has been seen and shared visit with Dr. Ellender Hose who agrees with work-up and plan for discharge after second troponin.  Initial Impression / Assessment and Plan / ED Course  I have reviewed the triage vital signs and the nursing notes.  Pertinent labs & imaging results that were available during my care of the patient were reviewed by me and considered in my medical decision making (see chart for details).       Final Clinical Impressions(s) / ED Diagnoses   Final diagnoses:  Nonspecific chest pain    ED Discharge Orders    None       Margarita Mail, PA-C 02/10/18 1621    Duffy Bruce, MD 02/11/18 [phone]

## 2018-02-10 NOTE — ED Notes (Signed)
Pt assisted to bathroom by staff ambulatory with steady gait, pt provided with crackers, peanut better, and coffee with cream per pt request. Pt warned that coffee is hot and needs time to cool before consuming

## 2018-02-10 NOTE — Discharge Instructions (Addendum)

## 2018-02-10 NOTE — ED Provider Notes (Signed)
Medical screening examination/treatment/procedure(s) were conducted as a shared visit with non-physician practitioner(s) and myself.  I personally evaluated the patient during the encounter. Briefly, the patient is a 84 yo F with PMHx AFib RVR, CAD, COPD, recent admission for CP s/p negative cardiac work-up and echo. Diagnosed with gastric ulcer on EGD. Suspect this is more so 2/2 her underlying gastric ulcer given neg cardiac work-up, reassuring recent TTE. She has some baseline dementia and I suspect she is forgetting she has an ulcer. Will delta trop x 2 given her underlying CAD but given recent admission <1 week ago for same, do not feel admission would be beneficial at this time. GI cocktail given.   EKG Interpretation  Date/Time:  Friday Feb 10 2018 06:15:32 EDT Ventricular Rate:  74 PR Interval:    QRS Duration: 70 QT Interval:  422 QTC Calculation: 469 R Axis:   52 Text Interpretation:  Sinus rhythm No significant change since last tracing Confirmed by Duffy Bruce [phone]) on 02/10/2018 7:01:01 AM           Duffy Bruce, MD 02/10/18 [phone]

## 2018-02-10 NOTE — ED Notes (Signed)
Called for ptar transport

## 2018-02-13 DIAGNOSIS — K210 Gastro-esophageal reflux disease with esophagitis: Secondary | ICD-10-CM | POA: Diagnosis not present

## 2018-02-13 DIAGNOSIS — J449 Chronic obstructive pulmonary disease, unspecified: Secondary | ICD-10-CM | POA: Diagnosis not present

## 2018-02-13 DIAGNOSIS — I480 Paroxysmal atrial fibrillation: Secondary | ICD-10-CM | POA: Diagnosis not present

## 2018-02-13 DIAGNOSIS — E785 Hyperlipidemia, unspecified: Secondary | ICD-10-CM | POA: Diagnosis not present

## 2018-02-14 DIAGNOSIS — I10 Essential (primary) hypertension: Secondary | ICD-10-CM | POA: Diagnosis not present

## 2018-02-14 DIAGNOSIS — J449 Chronic obstructive pulmonary disease, unspecified: Secondary | ICD-10-CM | POA: Diagnosis not present

## 2018-02-14 DIAGNOSIS — I480 Paroxysmal atrial fibrillation: Secondary | ICD-10-CM | POA: Diagnosis not present

## 2018-02-14 DIAGNOSIS — K210 Gastro-esophageal reflux disease with esophagitis: Secondary | ICD-10-CM | POA: Diagnosis not present

## 2018-02-15 DIAGNOSIS — G894 Chronic pain syndrome: Secondary | ICD-10-CM | POA: Diagnosis not present

## 2018-02-15 DIAGNOSIS — K210 Gastro-esophageal reflux disease with esophagitis: Secondary | ICD-10-CM | POA: Diagnosis not present

## 2018-02-15 DIAGNOSIS — E785 Hyperlipidemia, unspecified: Secondary | ICD-10-CM | POA: Diagnosis not present

## 2018-02-15 DIAGNOSIS — J449 Chronic obstructive pulmonary disease, unspecified: Secondary | ICD-10-CM | POA: Diagnosis not present

## 2018-03-02 ENCOUNTER — Encounter: Payer: Self-pay | Admitting: Family Medicine

## 2018-03-02 ENCOUNTER — Ambulatory Visit (INDEPENDENT_AMBULATORY_CARE_PROVIDER_SITE_OTHER): Payer: Medicare Other | Admitting: Family Medicine

## 2018-03-02 VITALS — BP 83/45 | HR 52 | Temp 97.7°F | Resp 16 | Ht 62.0 in | Wt 124.8 lb

## 2018-03-02 DIAGNOSIS — J4520 Mild intermittent asthma, uncomplicated: Secondary | ICD-10-CM | POA: Diagnosis not present

## 2018-03-02 DIAGNOSIS — I4891 Unspecified atrial fibrillation: Secondary | ICD-10-CM

## 2018-03-02 DIAGNOSIS — I10 Essential (primary) hypertension: Secondary | ICD-10-CM

## 2018-03-02 DIAGNOSIS — Z8719 Personal history of other diseases of the digestive system: Secondary | ICD-10-CM | POA: Diagnosis not present

## 2018-03-02 DIAGNOSIS — K279 Peptic ulcer, site unspecified, unspecified as acute or chronic, without hemorrhage or perforation: Secondary | ICD-10-CM | POA: Insufficient documentation

## 2018-03-02 DIAGNOSIS — G894 Chronic pain syndrome: Secondary | ICD-10-CM

## 2018-03-02 DIAGNOSIS — K2100 Gastro-esophageal reflux disease with esophagitis, without bleeding: Secondary | ICD-10-CM

## 2018-03-02 DIAGNOSIS — R413 Other amnesia: Secondary | ICD-10-CM

## 2018-03-02 LAB — CBC WITH DIFFERENTIAL/PLATELET
BASOS ABS: 0.1 10*3/uL (ref 0.0–0.1)
BASOS PCT: 1.2 % (ref 0.0–3.0)
EOS PCT: 2.9 % (ref 0.0–5.0)
Eosinophils Absolute: 0.1 10*3/uL (ref 0.0–0.7)
HEMATOCRIT: 35.9 % — AB (ref 36.0–46.0)
Hemoglobin: 11.6 g/dL — ABNORMAL LOW (ref 12.0–15.0)
Lymphocytes Relative: 27.3 % (ref 12.0–46.0)
Lymphs Abs: 1.4 10*3/uL (ref 0.7–4.0)
MCHC: 32.4 g/dL (ref 30.0–36.0)
MCV: 89.3 fl (ref 78.0–100.0)
Monocytes Absolute: 0.5 10*3/uL (ref 0.1–1.0)
Monocytes Relative: 8.9 % (ref 3.0–12.0)
NEUTROS PCT: 59.7 % (ref 43.0–77.0)
Neutro Abs: 3.1 10*3/uL (ref 1.4–7.7)
Platelets: 256 10*3/uL (ref 150.0–400.0)
RBC: 4.01 Mil/uL (ref 3.87–5.11)
RDW: 14.7 % (ref 11.5–15.5)
WBC: 5.2 10*3/uL (ref 4.0–10.5)

## 2018-03-02 LAB — VITAMIN B12: Vitamin B-12: 463 pg/mL (ref 211–911)

## 2018-03-02 NOTE — Assessment & Plan Note (Signed)
F/u labs  Needs gi f/u--- daughter will call with preferred GI

## 2018-03-02 NOTE — Assessment & Plan Note (Signed)
Per cardiology 

## 2018-03-02 NOTE — Progress Notes (Signed)
Patient ID: Angel [name], female    DOB: 1932/08/02  Age: 84 y.o. MRN: [id number]    Subjective:  Subjective  HPI Angel [name] presents for hosp f/u chest pain.  Pt was admitted in April and found to be in A fib -- she is seeing cardiology.  She was readmitted 5/12 for chest pain and her hgb dropped to 5 -- she was transfused 4 units and egd dx pud--  Her protonix was restarted-- she had stopped taking it.  Pt memory has been worsening.  Her daughter is with her today.  They are requesing a referral to psych for capacity  She was getting her meds wrong at home and taking too much pain med and valium   Review of Systems  Constitutional: Positive for fatigue. Negative for appetite change, diaphoresis and unexpected weight change.  Eyes: Negative for pain, redness and visual disturbance.  Respiratory: Negative for cough, chest tightness, shortness of breath and wheezing.   Cardiovascular: Negative for chest pain, palpitations and leg swelling.  Endocrine: Negative for cold intolerance, heat intolerance, polydipsia, polyphagia and polyuria.  Genitourinary: Negative for difficulty urinating, dysuria and frequency.  Neurological: Positive for light-headedness. Negative for dizziness, numbness and headaches.  Psychiatric/Behavioral: Positive for confusion and decreased concentration.    History Past Medical History:  Diagnosis Date  . Anxiety   . Asthma   . Atrial fibrillation with RVR ([place]) 12/2017  . Breast cancer ([place]) 05/07/1999, 06/2011   a. s/p bilat mastectomies. recurrence 01/2014. Radiation, meds.  . Cancer of chest (wall) ([place])   . Cataract    eye surgery planned for 11/2014  . Chest wall recurrence of breast cancer ([place]) 2016  . Cholelithiasis   . Chronic bronchitis   . COPD (chronic obstructive pulmonary disease) ([place])   . Coronary artery disease Non-obstructive   a. 2012 Cath: LAD 40-12m->Med Rx;  b. 12/2013 Lexi MV: EF 74%, no ischemia. c. Cath 10/2014 - 50-70% mLAD  but visually felt unchanged from prior, med rx recommended as sx were atypical.  . DDD (degenerative disc disease)   . Degenerative joint disease   . Depression   . Dyslipidemia   . GERD (gastroesophageal reflux disease)   . Hx of radiation therapy 07/02/1999- 08/11/1999   right supraclavucular/axillary region: 5040 cGy, 28 fractions  . Hx of radiation therapy 02/20/14- 03/27/14   right chest wall 4500 cGy 25 sessions  . Hypertension   . Kidney stones    one stones  . Mitral regurgitation    a. mild-mod by echo 12/2017.  . Osteoarthritis   . Osteoporosis   . Pre-diabetes   . PVC's (premature ventricular contractions)   . Reflux esophagitis   . Skin cancer of face    non melanoma  . Spinal stenosis     She has a past surgical history that includes Vesicovaginal fistula closure w/ TAH (age 35); Tubal ligation (1961); Appendectomy; Abdominal hysterectomy; Small intestine surgery (1998); Tonsillectomy; Breast lumpectomy (2002); Mastectomy (07/29/11); Cardiac catheterization (03/2011, 3/15); Breast biopsy (Right, 01/21/2014); Vein ligation and stripping (Right); left heart catheterization with coronary angiogram (N/A, 12/05/2013); left heart catheterization with coronary angiogram (N/A, 11/06/2014); Colonoscopy with propofol (N/A, 01/27/2018); and Esophagogastroduodenoscopy (egd) with propofol (N/A, 01/27/2018).   Her family history includes Asthma in her brother, brother, daughter, and grandchild; Cancer in her mother; Emphysema in her father; Heart disease in her brother and father; Hypertension in her daughter; Lymphoma in her brother; Pancreatic cancer in her mother; Stroke in her daughter.She reports that  she has never smoked. She has never used smokeless tobacco. She reports that she drinks alcohol. She reports that she does not use drugs.  Current Outpatient Medications on File Prior to Visit  Medication Sig Dispense Refill  . acetaminophen (TYLENOL) 325 MG tablet Take 2 tablets (650 mg total) by  mouth every 4 (four) hours as needed for headache or mild pain.    [name] albuterol (PROVENTIL HFA;VENTOLIN HFA) 108 (90 Base) MCG/ACT inhaler Inhale 1-2 puffs into the lungs every 4 (four) hours as needed for wheezing or shortness of breath. 1 Inhaler 0  . amiodarone (PACERONE) 200 MG tablet Take 1 tablet (200 mg total) by mouth daily. 30 tablet 0  . atorvastatin (LIPITOR) 20 MG tablet TAKE ONE TABLET BY MOUTH ONCE DAILY 30 tablet 0  . budesonide-formoterol (SYMBICORT) 160-4.5 MCG/ACT inhaler Inhale 2 puffs into the lungs 2 (two) times daily. 3 Inhaler 1  . Cholecalciferol (VITAMIN D-3) 5000 UNITS TABS Take 5,000 Units by mouth daily.     . diazepam (VALIUM) 2 MG tablet Take 1 tablet (2 mg total) by mouth 3 (three) times daily as needed for anxiety. 6 tablet 0  . DULoxetine (CYMBALTA) 60 MG capsule TAKE 1 CAPSULE BY MOUTH  EVERY DAY FOR MOOD. Need OV (Patient taking differently: Take 60 mg by mouth daily. FOR MOOD) 90 capsule 0  . ezetimibe (ZETIA) 10 MG tablet Take 1 tablet (10 mg total) by mouth daily.    [name] HYDROcodone-acetaminophen (NORCO) 10-325 MG tablet Take 1 tablet by mouth every 4 (four) hours as needed for severe pain. 6 tablet 0  . Hypromellose (ARTIFICIAL TEARS OP) Place 1 drop into both eyes 3 (three) times daily as needed (for dry eyes).     . montelukast (SINGULAIR) 10 MG tablet Take 1 tablet (10 mg total) by mouth at bedtime. Need OV (Patient taking differently: Take 10 mg by mouth at bedtime. ) 90 tablet 0  . Multiple Vitamin (MULTIVITAMIN WITH MINERALS) TABS tablet Take 1 tablet by mouth daily.    . nitroGLYCERIN (NITROSTAT) 0.4 MG SL tablet Place 1 tablet (0.4 mg total) under the tongue as needed. (Patient taking differently: Place 0.4 mg under the tongue every 5 (five) minutes as needed for chest pain. ) 25 tablet 3  . Omega-3 Fatty Acids (FISH OIL) 1000 MG CAPS Take 1,000 mg by mouth daily.    . ondansetron (ZOFRAN ODT) 4 MG disintegrating tablet Take 1 tablet (4 mg total) by mouth  every 8 (eight) hours as needed for nausea or vomiting. 20 tablet 4  . pantoprazole (PROTONIX) 40 MG tablet Take 1 tablet (40 mg total) by mouth daily. 30 tablet 0  . polyethylene glycol (MIRALAX / GLYCOLAX) packet Take 17 g by mouth daily. 14 each 0  . senna-docusate (SENOKOT-S) 8.6-50 MG tablet Take 1 tablet by mouth at bedtime. 30 tablet 0  . sodium phosphate (FLEET) 7-19 GM/118ML ENEM Place 133 mLs (1 enema total) rectally daily as needed for severe constipation.  0  . vitamin B-12 (CYANOCOBALAMIN) 500 MCG tablet Take 1 tablet (500 mcg total) by mouth daily. 30 tablet 0   No current facility-administered medications on file prior to visit.      Objective:  Objective  Physical Exam  Constitutional: She appears well-developed and well-nourished.  HENT:  Head: Normocephalic and atraumatic.  Eyes: Conjunctivae and EOM are normal.  Neck: Normal range of motion. Neck supple. No JVD present. Carotid bruit is not present. No thyromegaly present.  Cardiovascular: Normal rate, regular  rhythm and normal heart sounds.  No murmur heard. Pulmonary/Chest: Effort normal and breath sounds normal. No respiratory distress. She has no wheezes. She has no rales. She exhibits no tenderness.  Musculoskeletal: She exhibits edema.       Right ankle: She exhibits swelling.       Left ankle: She exhibits swelling.  Neurological: She is alert.  Psychiatric: She has a normal mood and affect. Her speech is normal. Thought content normal. She is slowed. Cognition and memory are impaired.  Nursing note and vitals reviewed.  BP (!) 83/45   Pulse (!) 52   Temp 97.7 F (36.5 C) (Oral)   Resp 16   Ht 5\' 2"  (1.575 m)   Wt 124 lb 12.8 oz (56.6 kg)   SpO2 94%   BMI 22.83 kg/m  Wt Readings from Last 3 Encounters:  03/02/18 124 lb 12.8 oz (56.6 kg)  02/10/18 112 lb (50.8 kg)  02/06/18 111 lb 12.4 oz (50.7 kg)     Lab Results  Component Value Date   WBC 5.6 02/10/2018   HGB 11.3 (L) 02/10/2018   HCT 35.8  (L) 02/10/2018   PLT 316 02/10/2018   GLUCOSE 105 (H) 02/10/2018   CHOL 165 01/21/2018   TRIG 70 01/21/2018   HDL 50 01/21/2018   LDLCALC 101 (H) 01/21/2018   ALT 13 (L) 02/10/2018   AST 21 02/10/2018   NA 136 02/10/2018   K 3.8 02/10/2018   CL 100 (L) 02/10/2018   CREATININE 0.78 02/10/2018   BUN 7 02/10/2018   CO2 27 02/10/2018   TSH 1.196 01/20/2018   INR 1.25 01/25/2018   HGBA1C 5.4 09/06/2014   MICROALBUR 0.77 05/01/2014    Dg Chest 2 View  Result Date: 02/10/2018 CLINICAL DATA:  Acute onset of burning sensation at the epigastric region. EXAM: CHEST - 2 VIEW COMPARISON:  Chest radiograph performed 02/05/2018 FINDINGS: The lungs are well-aerated. There is mild elevation of the right hemidiaphragm. There is no evidence of focal opacification, pleural effusion or pneumothorax. The heart is normal in size; the mediastinal contour is within normal limits. No acute osseous abnormalities are seen. Clips are noted at the axilla bilaterally. IMPRESSION: Mild elevation of the right hemidiaphragm. Lungs remain grossly clear. Electronically Signed   By: Garald Balding M.D.   On: 02/10/2018 06:51     Assessment & Plan:  Plan  I am having Amenah B. Oakley maintain her Vitamin D-3, ezetimibe, albuterol, atorvastatin, nitroGLYCERIN, DULoxetine, montelukast, ondansetron, Hypromellose (ARTIFICIAL TEARS OP), multivitamin with minerals, amiodarone, pantoprazole, senna-docusate, polyethylene glycol, vitamin B-12, acetaminophen, HYDROcodone-acetaminophen, diazepam, sodium phosphate, budesonide-formoterol, and Fish Oil.  No orders of the defined types were placed in this encounter.   Problem List Items Addressed This Visit      Unprioritized   Atrial fibrillation with RVR ([place])    Per cardiology      Chronic pain    Off norco On oxycodone       Essential hypertension    Now running low       H/O: GI bleed    F/u labs  Needs gi f/u--- daughter will call with preferred GI       Relevant Orders   Vitamin B12   CBC with Differential/Platelet   Memory loss - Primary    Neuro psych referral Check labs      Relevant Orders   Ambulatory referral to Neuropsychology   PUD (peptic ulcer disease)    protonix daily Check labs today  Relevant Orders   Vitamin B12   CBC with Differential/Platelet   Reflux esophagitis    Pt on protonix and needs GI f/u       Other Visit Diagnoses    Asthma, chronic, mild intermittent, uncomplicated          Follow-up: No follow-ups on file.  Ann Held, DO

## 2018-03-02 NOTE — Assessment & Plan Note (Signed)
Now running low

## 2018-03-02 NOTE — Assessment & Plan Note (Signed)
Pt on protonix and needs GI f/u

## 2018-03-02 NOTE — Assessment & Plan Note (Signed)
Off norco On oxycodone

## 2018-03-02 NOTE — Assessment & Plan Note (Signed)
Neuro psych referral Check labs

## 2018-03-02 NOTE — Assessment & Plan Note (Signed)
protonix daily Check labs today

## 2018-03-06 ENCOUNTER — Other Ambulatory Visit: Payer: Self-pay | Admitting: Cardiology

## 2018-03-06 ENCOUNTER — Other Ambulatory Visit: Payer: Self-pay | Admitting: Family Medicine

## 2018-03-06 DIAGNOSIS — E785 Hyperlipidemia, unspecified: Secondary | ICD-10-CM

## 2018-03-06 NOTE — Telephone Encounter (Signed)
°*  STAT* If patient is at the pharmacy, call can be transferred to refill team.   1. Which medications need to be refilled? (please list name of each medication and dose if known) Metoprolol, Atorvastatin,and  Amiodarone   2. Which pharmacy/location (including street and city if local pharmacy) is medication to be sent to?[facility] [phone]  3. Do they need a 30 day or 90 day supply? 90 and refills

## 2018-03-06 NOTE — Telephone Encounter (Signed)
Copied from [place] [id number]. Topic: Quick Communication - Rx Refill/Question >> Mar 06, 2018  2:42 PM Boyd Kerbs wrote: Medication:  pantoprazole (PROTONIX) 40 MG tablet DULoxetine (CYMBALTA) 60 MG capsule montelukast (SINGULAIR) 10 MG tablet albuterol (PROVENTIL HFA;VENTOLIN HFA) 108 (90 Base) MCG/ACT inhaler   Has the patient contacted their pharmacy? No. (Agent: If no, request that the patient contact the pharmacy for the refill.) (Agent: If yes, when and what did the pharmacy advise?)  Preferred Pharmacy (with phone number or street name):   [facility], [address] [place] [address] Phone: [phone] Fax: [phone]    Agent: Please be advised that RX refills may take up to 3 business days. We ask that you follow-up with your pharmacy.

## 2018-03-07 ENCOUNTER — Other Ambulatory Visit: Payer: Self-pay | Admitting: *Deleted

## 2018-03-07 DIAGNOSIS — J4520 Mild intermittent asthma, uncomplicated: Secondary | ICD-10-CM

## 2018-03-07 MED ORDER — DULOXETINE HCL 60 MG PO CPEP: ORAL_CAPSULE | ORAL | 0 refills | Status: DC

## 2018-03-07 MED ORDER — PANTOPRAZOLE SODIUM 40 MG PO TBEC: 40.0000 mg | DELAYED_RELEASE_TABLET | Freq: Every day | ORAL | 0 refills | Status: DC

## 2018-03-07 MED ORDER — MONTELUKAST SODIUM 10 MG PO TABS: 10.0000 mg | ORAL_TABLET | Freq: Every day | ORAL | 0 refills | Status: DC

## 2018-03-07 MED ORDER — AMIODARONE HCL 200 MG PO TABS: 200.0000 mg | ORAL_TABLET | Freq: Every day | ORAL | 0 refills | Status: DC

## 2018-03-07 MED ORDER — ALBUTEROL SULFATE HFA 108 (90 BASE) MCG/ACT IN AERS: 1.0000 | INHALATION_SPRAY | RESPIRATORY_TRACT | 0 refills | Status: AC | PRN

## 2018-03-07 NOTE — Telephone Encounter (Signed)
Rx refilled per last visit note: Cymbalta and Singulair 3 month supply until next follow up due. Protonix and Proventil prescribed by outside provider. Please review for refill- to continue on office note.   LOV: 03/02/18  PCP: [facility]  Pharmacy: verified

## 2018-03-07 NOTE — Telephone Encounter (Signed)
See PEC note. Routed to Dr. Etter Sjogren for review.

## 2018-03-07 NOTE — Telephone Encounter (Signed)
Pt's medication was sent to pt's pharmacy as requested, a 15 day supply because has not been seen since 04/2016. Pt also requested Metoprolol, this medication is no longer on pt's medication list, this medication was D/C by provider. Confirmation received.

## 2018-03-08 ENCOUNTER — Inpatient Hospital Stay: Payer: Medicare Other | Attending: Oncology

## 2018-03-08 ENCOUNTER — Inpatient Hospital Stay: Payer: Medicare Other

## 2018-03-08 VITALS — BP 113/58 | HR 60 | Temp 97.8°F | Resp 18

## 2018-03-08 DIAGNOSIS — Z5111 Encounter for antineoplastic chemotherapy: Secondary | ICD-10-CM | POA: Diagnosis not present

## 2018-03-08 DIAGNOSIS — M818 Other osteoporosis without current pathological fracture: Secondary | ICD-10-CM

## 2018-03-08 DIAGNOSIS — C7951 Secondary malignant neoplasm of bone: Secondary | ICD-10-CM | POA: Insufficient documentation

## 2018-03-08 DIAGNOSIS — C50911 Malignant neoplasm of unspecified site of right female breast: Secondary | ICD-10-CM

## 2018-03-08 LAB — CBC WITH DIFFERENTIAL/PLATELET
BASOS ABS: 0 10*3/uL (ref 0.0–0.1)
Basophils Relative: 1 %
Eosinophils Absolute: 0.1 10*3/uL (ref 0.0–0.5)
Eosinophils Relative: 3 %
HCT: 34 % — ABNORMAL LOW (ref 34.8–46.6)
Hemoglobin: 10.9 g/dL — ABNORMAL LOW (ref 11.6–15.9)
LYMPHS ABS: 1.2 10*3/uL (ref 0.9–3.3)
LYMPHS PCT: 28 %
MCH: 28.3 pg (ref 25.1–34.0)
MCHC: 32 g/dL (ref 31.5–36.0)
MCV: 88.4 fL (ref 79.5–101.0)
Monocytes Absolute: 0.3 10*3/uL (ref 0.1–0.9)
Monocytes Relative: 8 %
NEUTROS ABS: 2.6 10*3/uL (ref 1.5–6.5)
NEUTROS PCT: 60 %
PLATELETS: 247 10*3/uL (ref 145–400)
RBC: 3.85 MIL/uL (ref 3.70–5.45)
RDW: 14.6 % — ABNORMAL HIGH (ref 11.2–14.5)
WBC: 4.4 10*3/uL (ref 3.9–10.3)

## 2018-03-08 LAB — COMPREHENSIVE METABOLIC PANEL
ALT: 7 U/L (ref 0–55)
ANION GAP: 5 (ref 3–11)
AST: 18 U/L (ref 5–34)
Albumin: 3.2 g/dL — ABNORMAL LOW (ref 3.5–5.0)
Alkaline Phosphatase: 39 U/L — ABNORMAL LOW (ref 40–150)
BUN: 15 mg/dL (ref 7–26)
CHLORIDE: 103 mmol/L (ref 98–109)
CO2: 29 mmol/L (ref 22–29)
Calcium: 8.6 mg/dL (ref 8.4–10.4)
Creatinine, Ser: 0.84 mg/dL (ref 0.60–1.10)
Glucose, Bld: 79 mg/dL (ref 70–140)
POTASSIUM: 4.6 mmol/L (ref 3.5–5.1)
Sodium: 137 mmol/L (ref 136–145)
TOTAL PROTEIN: 6 g/dL — AB (ref 6.4–8.3)

## 2018-03-08 MED ORDER — FULVESTRANT 250 MG/5ML IM SOLN
INTRAMUSCULAR | Status: AC
  Filled 2018-03-08: qty 5

## 2018-03-08 MED ORDER — FULVESTRANT 250 MG/5ML IM SOLN
500.0000 mg | Freq: Once | INTRAMUSCULAR | Status: AC
  Administered 2018-03-08: 500 mg via INTRAMUSCULAR

## 2018-03-08 NOTE — Patient Instructions (Signed)

## 2018-03-17 ENCOUNTER — Encounter: Payer: Self-pay | Admitting: Physician Assistant

## 2018-03-17 ENCOUNTER — Ambulatory Visit: Payer: Medicare Other | Admitting: Physician Assistant

## 2018-03-17 VITALS — BP 100/70 | HR 62 | Ht 62.0 in | Wt 132.2 lb

## 2018-03-17 DIAGNOSIS — I10 Essential (primary) hypertension: Secondary | ICD-10-CM | POA: Diagnosis not present

## 2018-03-17 DIAGNOSIS — I2583 Coronary atherosclerosis due to lipid rich plaque: Secondary | ICD-10-CM

## 2018-03-17 DIAGNOSIS — R600 Localized edema: Secondary | ICD-10-CM

## 2018-03-17 DIAGNOSIS — I2510 Atherosclerotic heart disease of native coronary artery without angina pectoris: Secondary | ICD-10-CM

## 2018-03-17 DIAGNOSIS — R4189 Other symptoms and signs involving cognitive functions and awareness: Secondary | ICD-10-CM | POA: Diagnosis not present

## 2018-03-17 DIAGNOSIS — E785 Hyperlipidemia, unspecified: Secondary | ICD-10-CM

## 2018-03-17 DIAGNOSIS — I480 Paroxysmal atrial fibrillation: Secondary | ICD-10-CM | POA: Diagnosis not present

## 2018-03-17 MED ORDER — POTASSIUM CHLORIDE ER 10 MEQ PO TBCR: EXTENDED_RELEASE_TABLET | ORAL | 3 refills | Status: DC

## 2018-03-17 MED ORDER — FUROSEMIDE 20 MG PO TABS: 20.0000 mg | ORAL_TABLET | Freq: Every day | ORAL | 3 refills | Status: DC | PRN

## 2018-03-17 NOTE — Patient Instructions (Addendum)
Medication Instructions:  Your physician has recommended you make the following change in your medication: 1.  START Lasix 20 mg taking only as needed for Edema 2.  START Potassium 10 meq taking only when you take the Lasix   Labwork: None ordered  Testing/Procedures: Non ordered  Follow-Up: Your physician recommends that you schedule a follow-up appointment in: 3-4 MONTHS WITH DR. Radford Pax    Any Other Special Instructions Will Be Listed Below (If Applicable).     If you need a refill on your cardiac medications before your next appointment, please call your pharmacy.

## 2018-03-17 NOTE — Progress Notes (Signed)
Cardiology Office Note    Date:  03/17/2018   ID:  MASSIE COGLIANO, DOB [birth year], MRN [id number]  PCP:  Carollee Herter, Alferd Apa, DO  Cardiologist:  Dr. Radford Pax  Chief Complaint: Hospital follow up   History of Present Illness:   Angel [name] is a 84 y.o. female with hx of non-obstructive CAD (50-70% mLAD by last cath 10/2014), mild-mod MR, DDD, dyslipidemia, breast CA (tx with mastectomy, radiation, meds - followed by onc), remote asthma, GERD, reflux esophagitis, PVCs, pre-DM, anemia, and depression  presents for hospital follow up.   Patient was admitted 12/2017 due to not feeling well. Noted in new onset afib RVR. Echo showed improved LVEF of 60-65% from 50% but noted mild to moderate MR. Converted on sinus rhythm on medications. CHADSVASC score of 4 and treated with IV heparin however stopped due to worsening anemia. Hg dropped dramatically 9.0 --> 5.6 -->5.1, positive hemoccult. Did not felt candidate for long term anticoagulation.   Here today for follow up.  Patient blood work shows stable hemoglobin on aspirin 81 mg.  Patient denies chest pain, palpitations, shortness of breath, orthopnea, PND, syncope, dizziness, melena or blood in his stool or urine.  He has noted intermittent swelling 100 feet for past 1 week.  Compliant with low-sodium diet.  She is a retired [employer] scientist.  Takes care of herself.  Denies slurred speech, vision issues or weakness on one side of body.   Past Medical History:  Diagnosis Date  . Anxiety   . Asthma   . Atrial fibrillation with RVR ([place]) 12/2017  . Breast cancer ([place]) 05/07/1999, 06/2011   a. s/p bilat mastectomies. recurrence 01/2014. Radiation, meds.  . Cancer of chest (wall) ([place])   . Cataract    eye surgery planned for 11/2014  . Chest wall recurrence of breast cancer ([place]) 2016  . Cholelithiasis   . Chronic bronchitis   . COPD (chronic obstructive pulmonary disease) ([place])   . Coronary artery disease Non-obstructive   a. 2012 Cath: LAD  40-57m->Med Rx;  b. 12/2013 Lexi MV: EF 74%, no ischemia. c. Cath 10/2014 - 50-70% mLAD but visually felt unchanged from prior, med rx recommended as sx were atypical.  . DDD (degenerative disc disease)   . Degenerative joint disease   . Depression   . Dyslipidemia   . GERD (gastroesophageal reflux disease)   . Hx of radiation therapy 07/02/1999- 08/11/1999   right supraclavucular/axillary region: 5040 cGy, 28 fractions  . Hx of radiation therapy 02/20/14- 03/27/14   right chest wall 4500 cGy 25 sessions  . Hypertension   . Kidney stones    one stones  . Mitral regurgitation    a. mild-mod by echo 12/2017.  . Osteoarthritis   . Osteoporosis   . Pre-diabetes   . PVC's (premature ventricular contractions)   . Reflux esophagitis   . Skin cancer of face    non melanoma  . Spinal stenosis     Past Surgical History:  Procedure Laterality Date  . ABDOMINAL HYSTERECTOMY     in her 30's  . APPENDECTOMY    . BREAST BIOPSY Right 01/21/2014   Procedure: BREAST/CHEST WALL BIOPSY;  Surgeon: [name], MD;  Location: [facility];  Service: General;  Laterality: Right;  . BREAST LUMPECTOMY  2002   right breast  . CARDIAC CATHETERIZATION  03/2011, 3/15  . COLONOSCOPY WITH PROPOFOL N/A 01/27/2018   Procedure: COLONOSCOPY WITH PROPOFOL;  Surgeon: Wonda Horner, MD;  Location: [facility];  Service:  Endoscopy;  Laterality: N/A;  and EGD  . ESOPHAGOGASTRODUODENOSCOPY (EGD) WITH PROPOFOL N/A 01/27/2018   Procedure: ESOPHAGOGASTRODUODENOSCOPY (EGD) WITH PROPOFOL;  Surgeon: Wonda Horner, MD;  Location: [facility] ENDOSCOPY;  Service: Endoscopy;  Laterality: N/A;  . LEFT HEART CATHETERIZATION WITH CORONARY ANGIOGRAM N/A 12/05/2013   Procedure: LEFT HEART CATHETERIZATION WITH CORONARY ANGIOGRAM;  Surgeon: [name], MD;  Location[facility] CATH LAB;  Service: Cardiovascular;  Laterality: N/A;  . LEFT HEART CATHETERIZATION WITH CORONARY ANGIOGRAM N/A 11/06/2014   Procedure: LEFT HEART CATHETERIZATION WITH  CORONARY ANGIOGRAM;  Surgeon: [name], MD;  Location: [facility] CATH LAB;  Service: Cardiovascular;  Laterality: N/A;  . MASTECTOMY  07/29/11   bilateral-rt nodes-none on left  . SMALL INTESTINE SURGERY  1998   SBO  . TONSILLECTOMY    . TUBAL LIGATION  1961  . VEIN LIGATION AND STRIPPING Right   . VESICOVAGINAL FISTULA CLOSURE W/ TAH  age 53    Current Medications: Prior to Admission medications   Medication Sig Start Date End Date Taking? Authorizing Provider  acetaminophen (TYLENOL) 325 MG tablet Take 2 tablets (650 mg total) by mouth every 4 (four) hours as needed for headache or mild pain. 02/08/18   Geradine Girt, DO  albuterol (PROVENTIL HFA;VENTOLIN HFA) 108 (90 Base) MCG/ACT inhaler Inhale 1-2 puffs into the lungs every 4 (four) hours as needed for wheezing or shortness of breath. 03/07/18   Ann Held, DO  amiodarone (PACERONE) 200 MG tablet Take 1 tablet (200 mg total) by mouth daily. Please keep upcoming appt with Dr. Radford Pax before anymore refills. Final Attempt 03/07/18   [name], MD  atorvastatin (LIPITOR) 20 MG tablet Take 1 tablet (20 mg total) by mouth daily. Please keep upcoming appt in June before anymore refills. Final Attempt 03/07/18   [name], MD  budesonide-formoterol [facility]) 160-4.5 MCG/ACT inhaler Inhale 2 puffs into the lungs 2 (two) times daily. 02/08/18   Geradine Girt, DO  Cholecalciferol (VITAMIN D-3) 5000 UNITS TABS Take 5,000 Units by mouth daily.     [provider]  diazepam (VALIUM) 2 MG tablet Take 1 tablet (2 mg total) by mouth 3 (three) times daily as needed for anxiety. 02/08/18   Geradine Girt, DO  DULoxetine (CYMBALTA) 60 MG capsule TAKE 1 CAPSULE BY MOUTH  EVERY DAY FOR MOOD. 03/07/18   Carollee Herter, Alferd Apa, DO  ezetimibe (ZETIA) 10 MG tablet Take 1 tablet (10 mg total) by mouth daily. 04/25/15   Ann Held, DO  HYDROcodone-acetaminophen (NORCO) 10-325 MG tablet Take 1 tablet by mouth every 4 (four)  hours as needed for severe pain. 02/08/18   Geradine Girt, DO  Hypromellose (ARTIFICIAL TEARS OP) Place 1 drop into both eyes 3 (three) times daily as needed (for dry eyes).     [provider]  montelukast (SINGULAIR) 10 MG tablet Take 1 tablet (10 mg total) by mouth at bedtime. 03/07/18   Ann Held, DO  Multiple Vitamin (MULTIVITAMIN WITH MINERALS) TABS tablet Take 1 tablet by mouth daily.    [provider]  nitroGLYCERIN (NITROSTAT) 0.4 MG SL tablet Place 1 tablet (0.4 mg total) under the tongue as needed. Patient taking differently: Place 0.4 mg under the tongue every 5 (five) minutes as needed for chest pain.  11/23/17   [name], MD  Omega-3 Fatty Acids (FISH OIL) 1000 MG CAPS Take 1,000 mg by mouth daily.    [provider]  ondansetron (ZOFRAN ODT) 4 MG disintegrating tablet Take 1 tablet (4 mg total) by mouth every 8 (eight) hours as needed for nausea or vomiting. 01/11/18   Magrinat, Virgie Dad, MD  pantoprazole (PROTONIX) 40 MG tablet Take 1 tablet (40 mg total) by mouth daily. 03/07/18   Roma Schanz R, DO  polyethylene glycol (MIRALAX / GLYCOLAX) packet Take 17 g by mouth daily. 01/30/18   Florencia Reasons, MD  senna-docusate (SENOKOT-S) 8.6-50 MG tablet Take 1 tablet by mouth at bedtime. 01/30/18   Florencia Reasons, MD  sodium phosphate (FLEET) 7-19 GM/118ML ENEM Place 133 mLs (1 enema total) rectally daily as needed for severe constipation. 02/08/18   Geradine Girt, DO  vitamin B-12 (CYANOCOBALAMIN) 500 MCG tablet Take 1 tablet (500 mcg total) by mouth daily. 01/30/18   Florencia Reasons, MD    Allergies:   Adhesive [tape]; Gabapentin; and Penicillins   Social History   Socioeconomic History  . Marital status: Single    Spouse name: Not on file  . Number of children: 4  . Years of education: Not on file  . Highest education level: Not on file  Occupational History  . Occupation: Retired    Fish farm manager: OTHER    Comment: Nursing  Social Needs  . Financial  resource strain: Not on file  . Food insecurity:    Worry: Not on file    Inability: Not on file  . Transportation needs:    Medical: Not on file    Non-medical: Not on file  Tobacco Use  . Smoking status: Never Smoker  . Smokeless tobacco: Never Used  Substance and Sexual Activity  . Alcohol use: Yes    Comment: occ wine  . Drug use: No  . Sexual activity: Not on file  Lifestyle  . Physical activity:    Days per week: Not on file    Minutes per session: Not on file  . Stress: Not on file  Relationships  . Social connections:    Talks on phone: Not on file    Gets together: Not on file    Attends religious service: Not on file    Active member of club or organization: Not on file    Attends meetings of clubs or organizations: Not on file    Relationship status: Not on file  Other Topics Concern  . Not on file  Social History Narrative  . Not on file     Family History:  The patient's family history includes Asthma in her brother, brother, daughter, and grandchild; Cancer in her mother; Emphysema in her father; Heart disease in her brother and father; Hypertension in her daughter; Lymphoma in her brother; Pancreatic cancer in her mother; Stroke in her daughter.  ROS:   Please see the history of present illness.    ROS All other systems reviewed and are negative.   PHYSICAL EXAM:   VS:  BP 100/70   Pulse 62   Ht 5\' 2"  (1.575 m)   Wt 132 lb 3.2 oz (60 kg)   SpO2 97%   BMI 24.18 kg/m    GEN: Well nourished, well developed, in no acute distress  HEENT: normal  Neck: no JVD, carotid bruits, or masses Cardiac:RRR; no murmurs, rubs, or gallops, trace bilateral lower extremity edema Respiratory:  clear to auscultation bilaterally, normal work of breathing GI: soft, nontender, nondistended, + BS MS: no deformity or atrophy  Skin: warm and dry, no rash Neuro:  Alert and Oriented x 3, Strength and  sensation are intact Psych: euthymic mood, full affect  Wt Readings from  Last 3 Encounters:  03/17/18 132 lb 3.2 oz (60 kg)  03/02/18 124 lb 12.8 oz (56.6 kg)  02/10/18 112 lb (50.8 kg)      Studies/Labs Reviewed:   EKG:  EKG is not ordered today.    Recent Labs: 01/20/2018: B Natriuretic Peptide 455.0; TSH 1.196 01/30/2018: Magnesium 2.1 03/08/2018: ALT 7; BUN 15; Creatinine, Ser 0.84; Hemoglobin 10.9; Platelets 247; Potassium 4.6; Sodium 137   Lipid Panel    Component Value Date/Time   CHOL 165 01/21/2018 0730   TRIG 70 01/21/2018 0730   HDL 50 01/21/2018 0730   CHOLHDL 3.3 01/21/2018 0730   VLDL 14 01/21/2018 0730   LDLCALC 101 (H) 01/21/2018 0730    Additional studies/ records that were reviewed today include:   Echocardiogram:12/2017 Study Conclusions  - Left ventricle: The cavity size was normal. Systolic function was   normal. The estimated ejection fraction was in the range of 60%   to 65%. Wall motion was normal; there were no regional wall   motion abnormalities. The study was not technically sufficient to   allow evaluation of LV diastolic dysfunction due to atrial   fibrillation. - Aortic valve: Trileaflet; mildly thickened, mildly calcified   leaflets. Sclerosis without stenosis. - Mitral valve: There was mild to moderate regurgitation. - Left atrium: The atrium was normal in size. - Right ventricle: Systolic function was normal. - Tricuspid valve: There was mild regurgitation. - Pulmonic valve: There was mild regurgitation. - Pulmonary arteries: Systolic pressure was within the normal   range. - Inferior vena cava: The vessel was normal in size. - Pericardium, extracardiac: There was no pericardial effusion.  Impressions:  - When compared to the prior study from 08/20/2015 LVEF has   improved from 50% to 60-65%.   ASSESSMENT & PLAN:    1. PAF -Sinus rhythm by exam.  Continue amiodarone.  Not on anticoagulation due to GI bleed.  Hemoglobin stable on aspirin 81 mg.  2. CAD  - moderate nonobstructive CAD on cath  10/2014 with 50-70% mLAD lesion.  No chest pain or shortness of breath.  Continue aspirin and statin.  3. HLD - 01/21/2018: Cholesterol 165; HDL 50; LDL Cholesterol 101; Triglycerides 70; VLDL 14  -Continue statin  4. Chronic anemia - Stable hgb  5.  Bilateral lower leg edema -No dyspnea, orthopnea or PND.  Encouraged leg elevation and compression stocking.  She is compliant with low-sodium diet however and explained to cut back further.  PRN Lasix.  6.  Cognitive decline -Pending evaluation by neurology.  May consider discontinuation of statin if needed.  Medication Adjustments/Labs and Tests Ordered: Current medicines are reviewed at length with the patient today.  Concerns regarding medicines are outlined above.  Medication changes, Labs and Tests ordered today are listed in the Patient Instructions below. Patient Instructions  Medication Instructions:  Your physician recommends that you continue on your current medications as directed. Please refer to the Current Medication list given to you today.   Labwork: None ordered  Testing/Procedures: Non ordered  Follow-Up: Your physician recommends that you schedule a follow-up appointment in:    Any Other Special Instructions Will Be Listed Below (If Applicable).     If you need a refill on your cardiac medications before your next appointment, please call your pharmacy.      Jarrett Soho, PA  03/17/2018 1:51 PM    [facility] [address] [place], [place]  [address] Phone: [phone]; Fax: [phone]

## 2018-03-18 ENCOUNTER — Other Ambulatory Visit: Payer: Self-pay | Admitting: Cardiology

## 2018-03-18 DIAGNOSIS — E785 Hyperlipidemia, unspecified: Secondary | ICD-10-CM

## 2018-03-20 ENCOUNTER — Telehealth: Payer: Self-pay | Admitting: *Deleted

## 2018-03-20 NOTE — Telephone Encounter (Signed)
Received call from daughter, [name] stating that pt is with her in [place] & valium script was sent to [facility] in [place] that needs to be canceled & sent to [employer].  Ph # [phone].  Message sent to Dr Magrinat/Pod RN

## 2018-03-20 NOTE — Telephone Encounter (Signed)
FYI

## 2018-03-23 ENCOUNTER — Other Ambulatory Visit: Payer: Self-pay | Admitting: *Deleted

## 2018-03-23 ENCOUNTER — Telehealth: Payer: Self-pay | Admitting: Cardiology

## 2018-03-23 DIAGNOSIS — R609 Edema, unspecified: Secondary | ICD-10-CM

## 2018-03-23 DIAGNOSIS — I2510 Atherosclerotic heart disease of native coronary artery without angina pectoris: Secondary | ICD-10-CM

## 2018-03-23 DIAGNOSIS — M5136 Other intervertebral disc degeneration, lumbar region: Secondary | ICD-10-CM | POA: Diagnosis not present

## 2018-03-23 DIAGNOSIS — I2583 Coronary atherosclerosis due to lipid rich plaque: Secondary | ICD-10-CM

## 2018-03-23 DIAGNOSIS — M5030 Other cervical disc degeneration, unspecified cervical region: Secondary | ICD-10-CM | POA: Diagnosis not present

## 2018-03-23 DIAGNOSIS — I10 Essential (primary) hypertension: Secondary | ICD-10-CM

## 2018-03-23 MED ORDER — DIAZEPAM 5 MG PO TABS: 5.0000 mg | ORAL_TABLET | Freq: Every evening | ORAL | 1 refills | Status: DC | PRN

## 2018-03-23 NOTE — Telephone Encounter (Signed)
I tried to reach patient through multiple phone numbers and cannot leave a message, nor do I get an answer.

## 2018-03-23 NOTE — Telephone Encounter (Signed)
New Message:       Pt c/o medication issue:  1. Name of Medication: furosemide (LASIX) 20 MG tablet  2. How are you currently taking this medication (dosage and times per day)?  Take 1 tablet (20 mg total) by mouth daily as needed for edema. 3. Are you having a reaction (difficulty breathing--STAT)? No  4. What is your medication issue? Pt's daughter states this medication is not working for the pt. Pt is still having swelling in her legs and feet   Pt c/o swelling: STAT is pt has developed SOB within 24 hours  1) How much weight have you gained and in what time span?   2) If swelling, where is the swelling located? Legs and ankles  3) Are you currently taking a fluid pill? yes  4) Are you currently SOB? no  5) Do you have a log of your daily weights (if so, list)? no  6) Have you gained 3 pounds in a day or 5 pounds in a week?   7) Have you traveled recently? no

## 2018-03-24 ENCOUNTER — Encounter: Payer: Self-pay | Admitting: Psychology

## 2018-03-24 DIAGNOSIS — R609 Edema, unspecified: Secondary | ICD-10-CM | POA: Insufficient documentation

## 2018-03-24 NOTE — Telephone Encounter (Signed)
SPOKE WITH DAUGHTER AND SWELLING IS UNCHANGED SINCE STARTING FUROSEMIDE 20 MG PRN  HAS TAKEN DAILY SINCE Sunday PER  DAUGHTER SWELLING SO BAD UNABLE TO GET COMPRESSION STOCKINGS ON .DISCUSSED WITH DR ROSS HAVE PT INCREASE FUROSEMIDE TO 40 MG QOD  ALT WITH 20 MG QOD  AND COME IN Monday FOR BMET AND BNP .PT'S DAUGHTER AWARE .Adonis Housekeeper

## 2018-03-27 ENCOUNTER — Other Ambulatory Visit: Payer: Medicare Other | Admitting: *Deleted

## 2018-03-27 DIAGNOSIS — I10 Essential (primary) hypertension: Secondary | ICD-10-CM

## 2018-03-27 DIAGNOSIS — R609 Edema, unspecified: Secondary | ICD-10-CM | POA: Diagnosis not present

## 2018-03-27 DIAGNOSIS — I2583 Coronary atherosclerosis due to lipid rich plaque: Secondary | ICD-10-CM | POA: Diagnosis not present

## 2018-03-27 DIAGNOSIS — I2510 Atherosclerotic heart disease of native coronary artery without angina pectoris: Secondary | ICD-10-CM

## 2018-03-28 LAB — BASIC METABOLIC PANEL
BUN/Creatinine Ratio: 17 (ref 12–28)
BUN: 16 mg/dL (ref 8–27)
CALCIUM: 9.1 mg/dL (ref 8.7–10.3)
CO2: 24 mmol/L (ref 20–29)
CREATININE: 0.96 mg/dL (ref 0.57–1.00)
Chloride: 100 mmol/L (ref 96–106)
GFR calc Af Amer: 63 mL/min/{1.73_m2} (ref >59)
GFR, EST NON AFRICAN AMERICAN: 55 mL/min/{1.73_m2} — AB (ref >59)
GLUCOSE: 85 mg/dL (ref 65–99)
Potassium: 4.5 mmol/L (ref 3.5–5.2)
Sodium: 137 mmol/L (ref 134–144)

## 2018-03-28 LAB — PRO B NATRIURETIC PEPTIDE: NT-Pro BNP: 355 pg/mL (ref 0–738)

## 2018-03-29 ENCOUNTER — Telehealth: Payer: Self-pay

## 2018-03-29 NOTE — Telephone Encounter (Signed)
Pt's daughter, [name] called to inform that pt had experienced some lower extremity swelling and that her cardiologist had done blood work.  Daughter wanted to make sure that blood work her mother would have done here would not be duplicated. This RN returned call to number provided and lvm stating that our blood work would not be a duplicate of what her cardiologist had ordered and that her next lab appt with [place] is on 04/05/18 along with appt for injection.

## 2018-04-03 ENCOUNTER — Other Ambulatory Visit: Payer: Self-pay | Admitting: Cardiology

## 2018-04-03 MED ORDER — AMIODARONE HCL 200 MG PO TABS: 200.0000 mg | ORAL_TABLET | Freq: Every day | ORAL | 3 refills | Status: AC

## 2018-04-03 NOTE — Telephone Encounter (Signed)
Pt's medication was sent to pt's pharmacy as requested. Confirmation received.  °

## 2018-04-05 ENCOUNTER — Other Ambulatory Visit: Payer: Self-pay

## 2018-04-05 ENCOUNTER — Telehealth: Payer: Self-pay

## 2018-04-05 ENCOUNTER — Inpatient Hospital Stay: Payer: Medicare Other | Attending: Oncology

## 2018-04-05 ENCOUNTER — Inpatient Hospital Stay: Payer: Medicare Other

## 2018-04-05 DIAGNOSIS — Z5111 Encounter for antineoplastic chemotherapy: Secondary | ICD-10-CM | POA: Insufficient documentation

## 2018-04-05 DIAGNOSIS — C50911 Malignant neoplasm of unspecified site of right female breast: Secondary | ICD-10-CM

## 2018-04-05 DIAGNOSIS — C7951 Secondary malignant neoplasm of bone: Secondary | ICD-10-CM | POA: Insufficient documentation

## 2018-04-05 DIAGNOSIS — M818 Other osteoporosis without current pathological fracture: Secondary | ICD-10-CM

## 2018-04-05 LAB — COMPREHENSIVE METABOLIC PANEL
ALT: 11 U/L (ref 0–44)
ANION GAP: 6 (ref 5–15)
AST: 17 U/L (ref 15–41)
Albumin: 3.7 g/dL (ref 3.5–5.0)
Alkaline Phosphatase: 40 U/L (ref 38–126)
BILIRUBIN TOTAL: 0.2 mg/dL — AB (ref 0.3–1.2)
BUN: 17 mg/dL (ref 8–23)
CO2: 30 mmol/L (ref 22–32)
Calcium: 9.4 mg/dL (ref 8.9–10.3)
Chloride: 103 mmol/L (ref 98–111)
Creatinine, Ser: 1.01 mg/dL — ABNORMAL HIGH (ref 0.44–1.00)
GFR, EST AFRICAN AMERICAN: 58 mL/min — AB (ref >60)
GFR, EST NON AFRICAN AMERICAN: 50 mL/min — AB (ref >60)
Glucose, Bld: 79 mg/dL (ref 70–99)
Potassium: 4.9 mmol/L (ref 3.5–5.1)
Sodium: 139 mmol/L (ref 135–145)
TOTAL PROTEIN: 6.5 g/dL (ref 6.5–8.1)

## 2018-04-05 LAB — CBC WITH DIFFERENTIAL/PLATELET
Basophils Absolute: 0 10*3/uL (ref 0.0–0.1)
Basophils Relative: 0 %
EOS ABS: 0.1 10*3/uL (ref 0.0–0.5)
Eosinophils Relative: 3 %
HEMATOCRIT: 34 % — AB (ref 34.8–46.6)
Hemoglobin: 10.6 g/dL — ABNORMAL LOW (ref 11.6–15.9)
LYMPHS ABS: 1.7 10*3/uL (ref 0.9–3.3)
Lymphocytes Relative: 38 %
MCH: 28 pg (ref 25.1–34.0)
MCHC: 31.2 g/dL — AB (ref 31.5–36.0)
MCV: 89.9 fL (ref 79.5–101.0)
Monocytes Absolute: 0.5 10*3/uL (ref 0.1–0.9)
Monocytes Relative: 12 %
NEUTROS ABS: 2.1 10*3/uL (ref 1.5–6.5)
NEUTROS PCT: 47 %
PLATELETS: 202 10*3/uL (ref 145–400)
RBC: 3.78 MIL/uL (ref 3.70–5.45)
RDW: 14.8 % — AB (ref 11.2–14.5)
WBC: 4.5 10*3/uL (ref 3.9–10.3)

## 2018-04-05 MED ORDER — FULVESTRANT 250 MG/5ML IM SOLN
INTRAMUSCULAR | Status: AC
  Filled 2018-04-05: qty 10

## 2018-04-05 MED ORDER — FULVESTRANT 250 MG/5ML IM SOLN
500.0000 mg | Freq: Once | INTRAMUSCULAR | Status: AC
  Administered 2018-04-05: 500 mg via INTRAMUSCULAR

## 2018-04-05 NOTE — Telephone Encounter (Signed)
Pt daughter, Cathie Hoops, stopped in lobby today to request for pt Valium to be increased to BID instead of every day. Pt was recently diagnosed with progressive dementia, per daughter statement, and had to cut back on some medications over the past few weeks, including her valium that Dr.Magrinat recently decreased. Pt daughter states that the pt no longer lives by herself and has now moved in with her daughter, Sue Lush. They were told to make sure that pt gets 24/7 care d/t dementia.   Discussed with Dr.Magrinat and does not feel safe increasing her Valium to BID at this time, due to increased risk of confusion with her current dementia. Pt daughter verbalized understanding and does not know what to do for her mother. Suggested that they call the neuro psychologist to notify them of mother's worsening symptoms of irritability and loss of sleep. Abbie Sons to also contact her primary care to see if they are able to help with managing her dementia at this time.   Pt daughter thankful for the call and has no further questions at this time.

## 2018-04-13 DIAGNOSIS — M1711 Unilateral primary osteoarthritis, right knee: Secondary | ICD-10-CM | POA: Diagnosis not present

## 2018-04-21 ENCOUNTER — Telehealth: Payer: Self-pay | Admitting: Neurology

## 2018-04-21 DIAGNOSIS — Z7982 Long term (current) use of aspirin: Secondary | ICD-10-CM | POA: Diagnosis not present

## 2018-04-21 DIAGNOSIS — I4891 Unspecified atrial fibrillation: Secondary | ICD-10-CM | POA: Diagnosis not present

## 2018-04-21 DIAGNOSIS — Z79899 Other long term (current) drug therapy: Secondary | ICD-10-CM | POA: Diagnosis not present

## 2018-04-21 DIAGNOSIS — C50919 Malignant neoplasm of unspecified site of unspecified female breast: Secondary | ICD-10-CM | POA: Diagnosis not present

## 2018-04-21 DIAGNOSIS — Z880 Allergy status to penicillin: Secondary | ICD-10-CM | POA: Diagnosis not present

## 2018-04-21 DIAGNOSIS — R9389 Abnormal findings on diagnostic imaging of other specified body structures: Secondary | ICD-10-CM | POA: Diagnosis not present

## 2018-04-21 DIAGNOSIS — R451 Restlessness and agitation: Secondary | ICD-10-CM | POA: Diagnosis not present

## 2018-04-21 NOTE — Telephone Encounter (Signed)
Received Voicemail regarding this patient and how her Dementia is progressing. She is scheduled to see Dr. Si Raider on 09/04/2018. She is on the wait list. Angel [name] is needing Guardianship and needs a letter from a Neurologist. She said things have gotten much worse. She said cops have been called out due to her being Belligerent.  Please call [phone]. Thanks

## 2018-04-24 ENCOUNTER — Other Ambulatory Visit: Payer: Self-pay | Admitting: Family Medicine

## 2018-04-24 ENCOUNTER — Telehealth: Payer: Self-pay | Admitting: Family Medicine

## 2018-04-24 DIAGNOSIS — C50919 Malignant neoplasm of unspecified site of unspecified female breast: Secondary | ICD-10-CM | POA: Diagnosis not present

## 2018-04-24 MED ORDER — ISOSORBIDE MONONITRATE ER 30 MG PO TB24
30.00 | ORAL_TABLET | ORAL | Status: DC
Start: 2018-04-25 — End: 2018-04-24

## 2018-04-24 MED ORDER — HYDROCODONE-ACETAMINOPHEN 10-325 MG PO TABS
1.00 | ORAL_TABLET | ORAL | Status: DC
Start: ? — End: 2018-04-24

## 2018-04-24 MED ORDER — ALBUTEROL SULFATE HFA 108 (90 BASE) MCG/ACT IN AERS
2.00 | INHALATION_SPRAY | RESPIRATORY_TRACT | Status: DC
Start: ? — End: 2018-04-24

## 2018-04-24 MED ORDER — MONTELUKAST SODIUM 10 MG PO TABS
10.00 | ORAL_TABLET | ORAL | Status: DC
Start: 2018-04-24 — End: 2018-04-24

## 2018-04-24 MED ORDER — BUDESONIDE-FORMOTEROL FUMARATE 160-4.5 MCG/ACT IN AERO
2.00 | INHALATION_SPRAY | RESPIRATORY_TRACT | Status: DC
Start: 2018-04-24 — End: 2018-04-24

## 2018-04-24 MED ORDER — DIAZEPAM 5 MG PO TABS
5.00 | ORAL_TABLET | ORAL | Status: DC
Start: ? — End: 2018-04-24

## 2018-04-24 MED ORDER — LORAZEPAM 0.5 MG PO TABS
.50 | ORAL_TABLET | ORAL | Status: DC
Start: ? — End: 2018-04-24

## 2018-04-24 MED ORDER — METOPROLOL TARTRATE 25 MG PO TABS
25.00 | ORAL_TABLET | ORAL | Status: DC
Start: 2018-04-24 — End: 2018-04-24

## 2018-04-24 MED ORDER — ASPIRIN EC 81 MG PO TBEC
81.00 | DELAYED_RELEASE_TABLET | ORAL | Status: DC
Start: 2018-04-25 — End: 2018-04-24

## 2018-04-24 MED ORDER — DULOXETINE HCL 30 MG PO CPEP
60.00 | ORAL_CAPSULE | ORAL | Status: DC
Start: 2018-04-25 — End: 2018-04-24

## 2018-04-24 MED ORDER — QUETIAPINE FUMARATE 25 MG PO TABS
12.50 | ORAL_TABLET | ORAL | Status: DC
Start: 2018-04-24 — End: 2018-04-24

## 2018-04-24 NOTE — Telephone Encounter (Signed)
Yes -- pt needs ov  We could get palliative care involved -- they can come to the house  [place]-- any way neuro can see her sooner ?

## 2018-04-24 NOTE — Telephone Encounter (Signed)
Patient scheduled to be seen in office on 04/27/18.

## 2018-04-24 NOTE — Telephone Encounter (Signed)
Copied from [place] [id number]. Topic: Referral - Request >> Apr 24, 2018  9:31 AM Margot Ables wrote: Reason for CRM: [name] requesting Neurology referral to Dr. Creig Hines ph# [phone], [address], [place], [place] [address]. The referral sent to Neuropsychology was received but pt cannot be seen until 09/04/18. They feel a sooner appt is needed. Pt having memory decline and states this is urgent and pt needing to be seen as soon as they have an opening. She feels like 1 week would be too long to wait. She needs 24 hour care - the pts 3 children are taking turns caring for her. Pt is living with Nicanor Alcon. Pt no longer remembers who Freda Munro is. She calls her "that woman".

## 2018-04-25 ENCOUNTER — Ambulatory Visit: Payer: Medicare Other | Admitting: Family Medicine

## 2018-04-25 DIAGNOSIS — I10 Essential (primary) hypertension: Secondary | ICD-10-CM | POA: Diagnosis not present

## 2018-04-25 DIAGNOSIS — R079 Chest pain, unspecified: Secondary | ICD-10-CM | POA: Diagnosis not present

## 2018-04-25 DIAGNOSIS — R0789 Other chest pain: Secondary | ICD-10-CM | POA: Diagnosis not present

## 2018-04-25 NOTE — Telephone Encounter (Signed)
Received call from the [facility] that they are actually able to get pt in with Dr Carollee Herter today and will be able to give pt a 30 minute visit.

## 2018-04-25 NOTE — Telephone Encounter (Signed)
I am not entirely sure what to do with this as this pt has never been seen in our office.

## 2018-04-25 NOTE — Telephone Encounter (Signed)
No problem--- would they like me to put the palliative care referral in so we can get that started and maybe get someone to the house to help--- or do they want to wait?

## 2018-04-25 NOTE — Telephone Encounter (Addendum)
Caller name: Cathie Hoops  Relation to pt: daughter  Call back number:[phone] Pharmacy:  Reason for call:  Daughter cancelled patient 4:45pm appointment scheduled for today, stating patient is exhibiting aggressive behavior and dementia symptoms are progressing. Daughter states will keep her Thursday appointment with PCP.

## 2018-04-25 NOTE — Telephone Encounter (Signed)
Spoke with patients daughter. States they have decided they can no longer care for their mother. Patient was left alone today called a Taxi they were not sure where she went. The police was called. They are not sure where she is at this time. They are planning on selling her Condo and placing her in a home. Will be bringing patient in for office visit on Thursday if they can find her. Would like to talk to Dr. Etter Sjogren before she goes in the room with patient.

## 2018-04-26 ENCOUNTER — Telehealth: Payer: Self-pay | Admitting: Family Medicine

## 2018-04-26 NOTE — Telephone Encounter (Signed)
Please block extra time for her

## 2018-04-26 NOTE — Telephone Encounter (Signed)
Follow up call made to daughter. They were able to find patient. Patient has a 30 min appointment with provider.

## 2018-04-26 NOTE — Telephone Encounter (Signed)
Copied from [place] [id number]. Topic: General - Other >> Apr 26, 2018  4:14 PM Burchel, Abbi R wrote: Per Pt's daughter [name], pt is confused and disoriented off and on since May 2019.  She does not recognize family members. Pt's daughter believes she has sudden-onset dementia and it is worsening.  There is no one to stay with her.  She wanted to let Dr Carollee Herter know that she thinks pt should not be alone.  Can Dr Carollee Herter please advise them?    Pt's daughter [name]): [phone]

## 2018-04-26 NOTE — Telephone Encounter (Signed)
You talked to them yesterday.

## 2018-04-27 ENCOUNTER — Telehealth: Payer: Self-pay

## 2018-04-27 ENCOUNTER — Ambulatory Visit: Payer: Medicare Other | Admitting: Family Medicine

## 2018-04-27 ENCOUNTER — Other Ambulatory Visit: Payer: Self-pay | Admitting: Family Medicine

## 2018-04-27 DIAGNOSIS — F0391 Unspecified dementia with behavioral disturbance: Secondary | ICD-10-CM

## 2018-04-27 DIAGNOSIS — C50919 Malignant neoplasm of unspecified site of unspecified female breast: Secondary | ICD-10-CM

## 2018-04-27 DIAGNOSIS — R296 Repeated falls: Secondary | ICD-10-CM

## 2018-04-27 DIAGNOSIS — I4891 Unspecified atrial fibrillation: Secondary | ICD-10-CM

## 2018-04-27 NOTE — Telephone Encounter (Signed)
Angel [name] OK with referral to palliative care. Angel [name] stated she would let Freda Munro know of options we discussed, as sheila is"hard to get a hold of". Routed to Dr .Etter Sjogren.

## 2018-04-27 NOTE — Telephone Encounter (Signed)
Yes --- if it is really bad they can take her to er --  Hopefully she would be admitted and then social work can work on getting her placed I can but hospice / pall care referral in to get someone to the home quickly

## 2018-04-27 NOTE — Telephone Encounter (Signed)
VM left to offer to schedule visit with Palliative Care 

## 2018-04-27 NOTE — Telephone Encounter (Signed)
Author phone daughter, [name], re: options discussed with Dr. Etter Sjogren. [name] states pt. Is at condo by herself in [place] at this time. [name] states she lives two hours away,with all siblings on disability, so they are not able to care for their mother. Mother has been verbally and physically aggressive, especially when family references her confusion. "I know my mom has her meds, but I'm sure she's not taking all of them", [name] stated. [name] stated that other daughter, Freda Munro, working on getting guardianship, with medicaid. Daughter states they cannot afford assisted living.

## 2018-04-27 NOTE — Telephone Encounter (Signed)
They cancelled the appointment ----  Do they want me to put the referral in so at least we can try to get some help in the home for now?

## 2018-04-27 NOTE — Telephone Encounter (Signed)
Had lengthy conversation 2-3 days ago with one of patients daughters discussing patients condition. Dr. Carollee Herter is aware. Patients daughter was then advised to speak with Dr. Carollee Herter briefly prior to appointment with patient.

## 2018-04-27 NOTE — Telephone Encounter (Signed)
Options discussed with [name], daughter. See other telephone note.

## 2018-04-28 ENCOUNTER — Telehealth: Payer: Self-pay

## 2018-04-28 ENCOUNTER — Emergency Department (HOSPITAL_COMMUNITY)
Admission: EM | Admit: 2018-04-28 | Discharge: 2018-04-29 | Disposition: A | Discharge status: Home / Self Care | Payer: Medicare Other | Attending: Emergency Medicine | Admitting: Emergency Medicine

## 2018-04-28 ENCOUNTER — Other Ambulatory Visit: Payer: Self-pay

## 2018-04-28 ENCOUNTER — Encounter: Payer: Self-pay | Admitting: Family Medicine

## 2018-04-28 ENCOUNTER — Emergency Department (HOSPITAL_COMMUNITY): Payer: Medicare Other

## 2018-04-28 DIAGNOSIS — R410 Disorientation, unspecified: Secondary | ICD-10-CM | POA: Diagnosis not present

## 2018-04-28 DIAGNOSIS — Z853 Personal history of malignant neoplasm of breast: Secondary | ICD-10-CM | POA: Diagnosis not present

## 2018-04-28 DIAGNOSIS — R0602 Shortness of breath: Secondary | ICD-10-CM | POA: Diagnosis not present

## 2018-04-28 DIAGNOSIS — R0789 Other chest pain: Secondary | ICD-10-CM | POA: Insufficient documentation

## 2018-04-28 DIAGNOSIS — Z7982 Long term (current) use of aspirin: Secondary | ICD-10-CM | POA: Diagnosis not present

## 2018-04-28 DIAGNOSIS — J45909 Unspecified asthma, uncomplicated: Secondary | ICD-10-CM | POA: Insufficient documentation

## 2018-04-28 DIAGNOSIS — M546 Pain in thoracic spine: Secondary | ICD-10-CM | POA: Diagnosis not present

## 2018-04-28 DIAGNOSIS — Z79899 Other long term (current) drug therapy: Secondary | ICD-10-CM | POA: Insufficient documentation

## 2018-04-28 DIAGNOSIS — I2510 Atherosclerotic heart disease of native coronary artery without angina pectoris: Secondary | ICD-10-CM | POA: Diagnosis not present

## 2018-04-28 DIAGNOSIS — G8929 Other chronic pain: Secondary | ICD-10-CM | POA: Diagnosis not present

## 2018-04-28 DIAGNOSIS — M545 Low back pain: Secondary | ICD-10-CM | POA: Insufficient documentation

## 2018-04-28 DIAGNOSIS — R0902 Hypoxemia: Secondary | ICD-10-CM | POA: Diagnosis not present

## 2018-04-28 DIAGNOSIS — J449 Chronic obstructive pulmonary disease, unspecified: Secondary | ICD-10-CM | POA: Diagnosis not present

## 2018-04-28 DIAGNOSIS — E119 Type 2 diabetes mellitus without complications: Secondary | ICD-10-CM | POA: Insufficient documentation

## 2018-04-28 DIAGNOSIS — I959 Hypotension, unspecified: Secondary | ICD-10-CM | POA: Diagnosis not present

## 2018-04-28 DIAGNOSIS — R079 Chest pain, unspecified: Secondary | ICD-10-CM | POA: Diagnosis not present

## 2018-04-28 DIAGNOSIS — M5489 Other dorsalgia: Secondary | ICD-10-CM | POA: Diagnosis not present

## 2018-04-28 DIAGNOSIS — I10 Essential (primary) hypertension: Secondary | ICD-10-CM | POA: Diagnosis not present

## 2018-04-28 DIAGNOSIS — M542 Cervicalgia: Secondary | ICD-10-CM | POA: Diagnosis not present

## 2018-04-28 DIAGNOSIS — R51 Headache: Secondary | ICD-10-CM | POA: Diagnosis not present

## 2018-04-28 DIAGNOSIS — R4020 Unspecified coma: Secondary | ICD-10-CM | POA: Diagnosis not present

## 2018-04-28 LAB — BASIC METABOLIC PANEL
ANION GAP: 9 (ref 5–15)
BUN: 17 mg/dL (ref 8–23)
CALCIUM: 9.4 mg/dL (ref 8.9–10.3)
CO2: 26 mmol/L (ref 22–32)
CREATININE: 0.98 mg/dL (ref 0.44–1.00)
Chloride: 98 mmol/L (ref 98–111)
GFR, EST NON AFRICAN AMERICAN: 52 mL/min — AB (ref >60)
GLUCOSE: 128 mg/dL — AB (ref 70–99)
Potassium: 4.7 mmol/L (ref 3.5–5.1)
Sodium: 133 mmol/L — ABNORMAL LOW (ref 135–145)

## 2018-04-28 LAB — CBC
HCT: 36.4 % (ref 36.0–46.0)
HEMOGLOBIN: 11.3 g/dL — AB (ref 12.0–15.0)
MCH: 28.2 pg (ref 26.0–34.0)
MCHC: 31 g/dL (ref 30.0–36.0)
MCV: 90.8 fL (ref 78.0–100.0)
PLATELETS: 291 10*3/uL (ref 150–400)
RBC: 4.01 MIL/uL (ref 3.87–5.11)
RDW: 14.8 % (ref 11.5–15.5)
WBC: 8.8 10*3/uL (ref 4.0–10.5)

## 2018-04-28 LAB — I-STAT TROPONIN, ED: TROPONIN I, POC: 0.01 ng/mL (ref 0.00–0.08)

## 2018-04-28 MED ORDER — ONDANSETRON 4 MG PO TBDP
4.0000 mg | ORAL_TABLET | Freq: Once | ORAL | Status: AC
  Administered 2018-04-28: 4 mg via ORAL
  Filled 2018-04-28: qty 1

## 2018-04-28 NOTE — ED Notes (Signed)
Pt is extremely confused. She does not remember checking in or how she got here. She stated "the doctor put me on a new medication that is making me forget things." She has tried to call both her daughter and her son and neither answered. She states that she "does not need to be seen by a doctor".

## 2018-04-28 NOTE — ED Triage Notes (Signed)
Per ems pt was at home and having chest pain tonight. SOB and feeling nauseated. Was given 324 aspirin and 4 nitro sublingual. Headache and pt stated she feel like her head is about to explode.

## 2018-04-28 NOTE — Telephone Encounter (Signed)
Phone call placed to patient's daughter, Freda Munro, to schedule visit with Palliative Care. VM left.

## 2018-04-28 NOTE — ED Provider Notes (Signed)
Patient placed in Quick Look pathway, seen and evaluated   Chief Complaint: L shoulder pain, back/neck pain  HPI:   84 year old female presents with L shoulder, upper back and neck pain, headache, and chest pain. She is a poor historian and cannot give me a timeline of her symptoms. She called EMS tonight and when they responded they noted she was half-answering questions and was slow to respond. They also noted she had a butcher knife under her pillow. They gave her 4 nitro without significant relief of her pain. She reports hx of A.fib. She denies fevers. She has mild SOB.  ROS: +L shoulder, neck/back pain, chest pain  Physical Exam:   Gen: No distress. Shaking from chills. Makes poor eye contact and appears fatigued.  Neuro: Awake and Alert  Skin: Warm    Focused Exam: Heart: Regular rate and rhythm     Lungs: CTA   Initiation of care has begun. The patient has been counseled on the process, plan, and necessity for staying for the completion/evaluation, and the remainder of the medical screening examination    Recardo Evangelist, PA-C 04/28/18 2108    Duffy Bruce, MD 04/29/18 [phone]

## 2018-04-28 NOTE — ED Notes (Signed)
Daughter called (unable to come and visit due to "torn ligament") to share that mother/patient has had sudden onset of dementia and that she lives alone.

## 2018-04-29 ENCOUNTER — Other Ambulatory Visit: Payer: Self-pay

## 2018-04-29 ENCOUNTER — Emergency Department (HOSPITAL_COMMUNITY): Payer: Medicare Other

## 2018-04-29 DIAGNOSIS — M546 Pain in thoracic spine: Secondary | ICD-10-CM | POA: Diagnosis not present

## 2018-04-29 DIAGNOSIS — M545 Low back pain: Secondary | ICD-10-CM | POA: Diagnosis not present

## 2018-04-29 DIAGNOSIS — R51 Headache: Secondary | ICD-10-CM | POA: Diagnosis not present

## 2018-04-29 DIAGNOSIS — M542 Cervicalgia: Secondary | ICD-10-CM | POA: Diagnosis not present

## 2018-04-29 DIAGNOSIS — R4020 Unspecified coma: Secondary | ICD-10-CM | POA: Diagnosis not present

## 2018-04-29 LAB — CBC WITH DIFFERENTIAL/PLATELET
ABS IMMATURE GRANULOCYTES: 0 10*3/uL (ref 0.0–0.1)
BASOS ABS: 0 10*3/uL (ref 0.0–0.1)
BASOS PCT: 0 %
Eosinophils Absolute: 0 10*3/uL (ref 0.0–0.7)
Eosinophils Relative: 0 %
HCT: 34.4 % — ABNORMAL LOW (ref 36.0–46.0)
Hemoglobin: 10.7 g/dL — ABNORMAL LOW (ref 12.0–15.0)
Immature Granulocytes: 0 %
LYMPHS PCT: 24 %
Lymphs Abs: 1.9 10*3/uL (ref 0.7–4.0)
MCH: 27.9 pg (ref 26.0–34.0)
MCHC: 31.1 g/dL (ref 30.0–36.0)
MCV: 89.8 fL (ref 78.0–100.0)
Monocytes Absolute: 0.6 10*3/uL (ref 0.1–1.0)
Monocytes Relative: 8 %
NEUTROS ABS: 5.2 10*3/uL (ref 1.7–7.7)
NEUTROS PCT: 68 %
PLATELETS: 252 10*3/uL (ref 150–400)
RBC: 3.83 MIL/uL — AB (ref 3.87–5.11)
RDW: 14.9 % (ref 11.5–15.5)
WBC: 7.7 10*3/uL (ref 4.0–10.5)

## 2018-04-29 LAB — URINALYSIS, COMPLETE (UACMP) WITH MICROSCOPIC
BILIRUBIN URINE: NEGATIVE
Bacteria, UA: NONE SEEN
Glucose, UA: NEGATIVE mg/dL
HGB URINE DIPSTICK: NEGATIVE
KETONES UR: NEGATIVE mg/dL
Leukocytes, UA: NEGATIVE
NITRITE: NEGATIVE
PH: 6 (ref 5.0–8.0)
Protein, ur: NEGATIVE mg/dL
SPECIFIC GRAVITY, URINE: 1.004 — AB (ref 1.005–1.030)

## 2018-04-29 LAB — AMMONIA: AMMONIA: 14 umol/L (ref 9–35)

## 2018-04-29 LAB — RAPID URINE DRUG SCREEN, HOSP PERFORMED
Amphetamines: NOT DETECTED
BARBITURATES: NOT DETECTED
Benzodiazepines: POSITIVE — AB
COCAINE: NOT DETECTED
Opiates: POSITIVE — AB
Tetrahydrocannabinol: NOT DETECTED

## 2018-04-29 LAB — CBG MONITORING, ED: GLUCOSE-CAPILLARY: 94 mg/dL (ref 70–99)

## 2018-04-29 LAB — ETHANOL

## 2018-04-29 LAB — I-STAT CG4 LACTIC ACID, ED: Lactic Acid, Venous: 0.89 mmol/L (ref 0.5–1.9)

## 2018-04-29 NOTE — ED Notes (Signed)
Patient transported to CT 

## 2018-04-29 NOTE — ED Notes (Signed)
Patient transported to X-ray 

## 2018-04-29 NOTE — ED Provider Notes (Signed)
[facility] EMERGENCY DEPARTMENT Provider Note   CSN: [id number] Arrival date & time: 04/28/18  2053     History   Chief Complaint Chief Complaint  Patient presents with  . Chest Pain    HPI GALENA LOGIE is a 84 y.o. female with a hx of anemia, A. Fib (on oral amiodarone), breast cancer recurrent to the chest wall, peptic ulcer disease with history of GI bleed, dyspnea on exertion, spinal stenosis, diabetes, presents to the Emergency Department complaining of gradual, persistent, progressively worsening left shoulder, upper back and neck pain along with headache and chest pain.  Patient is unable to give a timeline of her symptoms however per EMS, patient was slow to respond to questions.  Also per EMS, patient was given nitro x4 without relief of her chest pain.  She does report associated mild shortness of breath.  She denies fevers, chills, nausea, vomiting, diaphoresis.  Pt is able to answer questions but seems confused about the current situation.  She reports that she presents today for neck and back pain present for the last 3 months.  She initially denies chest pain and headache but when specifically questioned about her statements earlier she states she did have chest pain earlier but this has resolved completely and has not returned.  She reports intermittent falls over the last several weeks but cannot remember her last fall.  She does not think she fell today.  LEVEL 5 CAVEAT for AMS.  Discussed patient with her daughter who reports concerns for "dementia" without a diagnosis.  She reports her brother received a call from [employer] who requested family help as she had called 911 a total of 10 times in the last 2.5 days.  Daughter is working on guardianship to have her come live in [place] with her.  Daughter reports regular confusion about the day or time.  She reports difficulty with short term memory.  Daughter reports pt becomes often becomes very confused and  has panic attacks.    The history is provided by the patient, medical records, the EMS personnel and a relative (Daughter). No language interpreter was used.    Past Medical History:  Diagnosis Date  . Anxiety   . Asthma   . Atrial fibrillation with RVR ([place]) 12/2017  . Breast cancer ([place]) 05/07/1999, 06/2011   a. s/p bilat mastectomies. recurrence 01/2014. Radiation, meds.  . Cancer of chest (wall) ([place])   . Cataract    eye surgery planned for 11/2014  . Chest wall recurrence of breast cancer ([place]) 2016  . Cholelithiasis   . Chronic bronchitis   . COPD (chronic obstructive pulmonary disease) ([place])   . Coronary artery disease Non-obstructive   a. 2012 Cath: LAD 40-29m->Med Rx;  b. 12/2013 [facility] MV: EF 74%, no ischemia. c. Cath 10/2014 - 50-70% mLAD but visually felt unchanged from prior, med rx recommended as sx were atypical.  . DDD (degenerative disc disease)   . Degenerative joint disease   . Depression   . Dyslipidemia   . GERD (gastroesophageal reflux disease)   . Hx of radiation therapy 07/02/1999- 08/11/1999   right supraclavucular/axillary region: 5040 cGy, 28 fractions  . Hx of radiation therapy 02/20/14- 03/27/14   right chest wall 4500 cGy 25 sessions  . Hypertension   . Kidney stones    one stones  . Mitral regurgitation    a. mild-mod by echo 12/2017.  . Osteoarthritis   . Osteoporosis   . Pre-diabetes   . PVC's (  premature ventricular contractions)   . Reflux esophagitis   . Skin cancer of face    non melanoma  . Spinal stenosis     Patient Active Problem List   Diagnosis Date Noted  . Edema 03/24/2018  . PUD (peptic ulcer disease) 03/02/2018  . H/O: GI bleed 03/02/2018  . Memory loss 03/02/2018  . Acute post-hemorrhagic anemia 01/25/2018  . Atrial fibrillation with RVR ([place]) 01/20/2018  . Spinal stenosis   . Skin cancer of face   . Reflux esophagitis   . PVC's (premature ventricular contractions)   . Pre-diabetes   . Osteoarthritis   . Kidney stones   .  Hx of radiation therapy   . GERD (gastroesophageal reflux disease)   . Depression   . Degenerative joint disease   . Coronary artery disease   . COPD (chronic obstructive pulmonary disease) ([place])   . Cholelithiasis   . Cataract   . Cancer of chest (wall) ([place])   . Anxiety   . Malignant neoplasm of overlapping sites of right breast in female, estrogen receptor positive ([place]) 10/21/2016  . Osteoporosis 02/18/2016  . Corneal scar, left eye 12/31/2015  . Frequent falls 08/02/2015  . DOE (dyspnea on exertion) 08/02/2015  . Anxiety and depression 07/23/2015  . Anemia of chronic disease 07/23/2015  . Pain in the chest   . Nuclear sclerosis of left eye 07/17/2015  . Cystoid macular edema of right eye 11/14/2014  . Chest pain 11/01/2014  . Dyslipidemia   . Chest wall recurrence of breast cancer ([place]) 09/27/2014  . Recurrent cancer of right breast ([place]) 05/29/2014  . Essential hypertension 05/01/2014  . Epiretinal membrane, right eye 08/22/2013  . Lamellar macular hole of right eye 08/22/2013  . Chronic pain 02/11/2011  . Asthma     Past Surgical History:  Procedure Laterality Date  . ABDOMINAL HYSTERECTOMY     in her 30's  . APPENDECTOMY    . BREAST BIOPSY Right 01/21/2014   Procedure: BREAST/CHEST WALL BIOPSY;  Surgeon: [name], MD;  Location: [facility];  Service: General;  Laterality: Right;  . BREAST LUMPECTOMY  2002   right breast  . CARDIAC CATHETERIZATION  03/2011, 3/15  . COLONOSCOPY WITH PROPOFOL N/A 01/27/2018   Procedure: COLONOSCOPY WITH PROPOFOL;  Surgeon: Wonda Horner, MD;  Location: [facility] ENDOSCOPY;  Service: Endoscopy;  Laterality: N/A;  and EGD  . ESOPHAGOGASTRODUODENOSCOPY (EGD) WITH PROPOFOL N/A 01/27/2018   Procedure: ESOPHAGOGASTRODUODENOSCOPY (EGD) WITH PROPOFOL;  Surgeon: Wonda Horner, MD;  Location: [facility] ENDOSCOPY;  Service: Endoscopy;  Laterality: N/A;  . LEFT HEART CATHETERIZATION WITH CORONARY ANGIOGRAM N/A 12/05/2013   Procedure: LEFT HEART CATHETERIZATION  WITH CORONARY ANGIOGRAM;  Surgeon: [name], MD;  Location: [facility] CATH LAB;  Service: Cardiovascular;  Laterality: N/A;  . LEFT HEART CATHETERIZATION WITH CORONARY ANGIOGRAM N/A 11/06/2014   Procedure: LEFT HEART CATHETERIZATION WITH CORONARY ANGIOGRAM;  Surgeon: [name], MD;  Location: [facility] C[facility] LAB;  Service: Cardiovascular;  Laterality: N/A;  . MASTECTOMY  07/29/11   bilateral-rt nodes-none on left  . SMALL INTESTINE SURGERY  1998   SBO  . TONSILLECTOMY    . TUBAL LIGATION  1961  . VEIN LIGATION AND STRIPPING Right   . VESICOVAGINAL FISTULA CLOSURE W/ TAH  age 66     OB History   None      Home Medications    Prior to Admission medications   Medication Sig Start Date End Date Taking? Authorizing Provider  acetaminophen (TYLENOL) 325  MG tablet Take 2 tablets (650 mg total) by mouth every 4 (four) hours as needed for headache or mild pain. 02/08/18   Geradine Girt, DO  albuterol (PROVENTIL HFA;VENTOLIN HFA) 108 (90 Base) MCG/ACT inhaler Inhale 1-2 puffs into the lungs every 4 (four) hours as needed for wheezing or shortness of breath. 03/07/18   Ann Held, DO  amiodarone (PACERONE) 200 MG tablet Take 1 tablet (200 mg total) by mouth daily. 04/03/18   [name], MD  aspirin 81 MG chewable tablet Chew 81 mg by mouth daily.    [provider]  atorvastatin (LIPITOR) 20 MG tablet TAKE 1 TABLET BY MOUTH ONCE DAILY 03/20/18   [name], MD  budesonide-formoterol (SYMBICORT) 160-4.5 MCG/ACT inhaler Inhale 2 puffs into the lungs 2 (two) times daily. 02/08/18   Geradine Girt, DO  Cholecalciferol (VITAMIN D-3) 5000 UNITS TABS Take 5,000 Units by mouth daily.     [provider]  diazepam (VALIUM) 5 MG tablet Take 1 tablet (5 mg total) by mouth at bedtime as needed for anxiety. 03/23/18 04/22/18  Magrinat, Virgie Dad, MD  DULoxetine (CYMBALTA) 60 MG capsule TAKE 1 CAPSULE BY MOUTH  EVERY DAY FOR MOOD. 03/07/18   Carollee Herter, Alferd Apa, DO    ezetimibe (ZETIA) 10 MG tablet Take 1 tablet (10 mg total) by mouth daily. 04/25/15   Ann Held, DO  furosemide (LASIX) 20 MG tablet Take 1 tablet (20 mg total) by mouth daily as needed for edema. 03/17/18 06/15/18  Leanor Kail, PA  HYDROcodone-acetaminophen (NORCO) 10-325 MG tablet Take 1 tablet by mouth every 4 (four) hours as needed for severe pain. 02/08/18   Geradine Girt, DO  Hypromellose (ARTIFICIAL TEARS OP) Place 1 drop into both eyes 3 (three) times daily as needed (for dry eyes).     [provider]  montelukast (SINGULAIR) 10 MG tablet Take 1 tablet (10 mg total) by mouth at bedtime. 03/07/18   Ann Held, DO  Multiple Vitamin (MULTIVITAMIN WITH MINERALS) TABS tablet Take 1 tablet by mouth daily.    [provider]  nitroGLYCERIN (NITROSTAT) 0.4 MG SL tablet Place 1 tablet (0.4 mg total) under the tongue as needed. Patient taking differently: Place 0.4 mg under the tongue every 5 (five) minutes as needed for chest pain.  11/23/17   [name], MD  Omega-3 Fatty Acids (FISH OIL) 1000 MG CAPS Take 1,000 mg by mouth daily.    [provider]  ondansetron (ZOFRAN ODT) 4 MG disintegrating tablet Take 1 tablet (4 mg total) by mouth every 8 (eight) hours as needed for nausea or vomiting. 01/11/18   Magrinat, Virgie Dad, MD  pantoprazole (PROTONIX) 40 MG tablet Take 1 tablet (40 mg total) by mouth daily. 03/07/18   Roma Schanz R, DO  polyethylene glycol (MIRALAX / GLYCOLAX) packet Take 17 g by mouth daily. 01/30/18   Florencia Reasons, MD  potassium chloride (K-DUR) 10 MEQ tablet TAKE 1 TABLET BY MOUTH ONLY WHEN YOU TAKE A LASIX 03/17/18   Bhagat, Bhavinkumar, PA  senna-docusate (SENOKOT-S) 8.6-50 MG tablet Take 1 tablet by mouth at bedtime. 01/30/18   Florencia Reasons, MD  sodium phosphate (FLEET) 7-19 GM/118ML ENEM Place 133 mLs (1 enema total) rectally daily as needed for severe constipation. 02/08/18   Geradine Girt, DO  vitamin B-12 (CYANOCOBALAMIN)  500 MCG tablet Take 1 tablet (500 mcg total) by mouth daily. 01/30/18   Florencia Reasons, MD  Family History Family History  Problem Relation Age of Onset  . Pancreatic cancer Mother   . Cancer Mother        pancreatic  . Asthma Daughter   . Stroke Daughter   . Hypertension Daughter   . Heart disease Father   . Emphysema Father   . Lymphoma Brother   . Asthma Brother   . Asthma Grandchild   . Asthma Brother        deceased  . Heart disease Brother     Social History Social History   Tobacco Use  . Smoking status: Never Smoker  . Smokeless tobacco: Never Used  Substance Use Topics  . Alcohol use: Yes    Comment: occ wine  . Drug use: No     Allergies   Adhesive [tape]; Gabapentin; and Penicillins   Review of Systems Review of Systems  Unable to perform ROS: Mental status change     Physical Exam Updated Vital Signs BP (!) 109/55   Pulse 71   Temp 98.2 F (36.8 C) (Oral)   Resp 16   Ht 5\' 2"  (1.575 m)   Wt 51.3 kg (113 lb)   SpO2 96%   BMI 20.67 kg/m   Physical Exam  Constitutional: She appears well-developed and well-nourished. No distress.  Awake, alert, nontoxic appearance  HENT:  Head: Normocephalic and atraumatic.  Mouth/Throat: Oropharynx is clear and moist. No oropharyngeal exudate.  Eyes: Conjunctivae are normal. No scleral icterus.  Neck: Normal range of motion. Neck supple.  Cardiovascular: Normal rate, regular rhythm and intact distal pulses.  Pulmonary/Chest: Effort normal and breath sounds normal. No respiratory distress. She has no wheezes.  Equal chest expansion Status post bilateral mastectomy  Abdominal: Soft. Bowel sounds are normal. She exhibits no mass. There is no tenderness. There is no rebound and no guarding.  Musculoskeletal: Normal range of motion. She exhibits no edema.  General tenderness along the cervical, thoracic and lumbar spine.  No step-off or deformity.  Neurological: She is alert.  Speech is clear Patient is  oriented to person, she is unable to orient to date, time or event she rambles when asked specific questions Moves extremities without ataxia  Skin: Skin is warm and dry. She is not diaphoretic.  Psychiatric: She has a normal mood and affect.  Nursing note and vitals reviewed.    ED Treatments / Results  Labs (all labs ordered are listed, but only abnormal results are displayed) Labs Reviewed  BASIC METABOLIC PANEL - Abnormal; Notable for the following components:      Result Value   Sodium 133 (*)    Glucose, Bld 128 (*)    GFR calc non Af Amer 52 (*)    All other components within normal limits  CBC - Abnormal; Notable for the following components:   Hemoglobin 11.3 (*)    All other components within normal limits  CBC WITH DIFFERENTIAL/PLATELET - Abnormal; Notable for the following components:   RBC 3.83 (*)    Hemoglobin 10.7 (*)    HCT 34.4 (*)    All other components within normal limits  URINALYSIS, COMPLETE (UACMP) WITH MICROSCOPIC - Abnormal; Notable for the following components:   Color, Urine COLORLESS (*)    Specific Gravity, Urine 1.004 (*)    All other components within normal limits  RAPID URINE DRUG SCREEN, HOSP PERFORMED - Abnormal; Notable for the following components:   Opiates POSITIVE (*)    Benzodiazepines POSITIVE (*)    All other components within  normal limits  CULTURE, BLOOD (ROUTINE X 2)  CULTURE, BLOOD (ROUTINE X 2)  URINE CULTURE  AMMONIA  ETHANOL  I-STAT TROPONIN, ED  CBG MONITORING, ED  I-STAT CG4 LACTIC ACID, ED  CBG MONITORING, ED    EKG EKG Interpretation  Date/Time:  Friday April 28 2018 21:00:57 EDT Ventricular Rate:  79 PR Interval:  156 QRS Duration: 76 QT Interval:  410 QTC Calculation: 470 R Axis:   22 Text Interpretation:  Normal sinus rhythm Normal ECG Confirmed by Ripley Fraise [phone]) on 04/29/2018 4:53:39 AM   Radiology Dg Chest 2 View  Result Date: 04/28/2018 CLINICAL DATA:  Chest pain tonight.  Shortness of  breath. EXAM: CHEST - 2 VIEW COMPARISON:  Radiographs 02/10/2018 FINDINGS: Unchanged eventration of right hemidiaphragm.The cardiomediastinal contours are unchanged with aortic tortuosity. Aortic arch atherosclerosis. Pulmonary vasculature is normal. No consolidation, pleural effusion, or pneumothorax. No acute osseous abnormalities are seen. Surgical clips in the axilla. IMPRESSION: 1. No acute findings. 2. Unchanged eventration of right hemidiaphragm. 3.  Aortic Atherosclerosis (ICD10-I70.0). Electronically Signed   By: Jeb Levering M.D.   On: 04/28/2018 22:01   Ct Head Wo Contrast  Result Date: 04/29/2018 CLINICAL DATA:  Altered level of consciousness.  Headache. EXAM: CT HEAD WITHOUT CONTRAST TECHNIQUE: Contiguous axial images were obtained from the base of the skull through the vertex without intravenous contrast. COMPARISON:  02/14/2015 FINDINGS: Brain: Stable atrophy greatest in the frontal lobes. Mild chronic small vessel ischemia, stable from prior. No intracranial hemorrhage, mass effect, or midline shift. No hydrocephalus. The basilar cisterns are patent. No evidence of territorial infarct or acute ischemia. No extra-axial or intracranial fluid collection. Vascular: Atherosclerosis of skullbase vasculature without hyperdense vessel or abnormal calcification. Skull: Scattered calvarial lucencies are unchanged from prior exam (stable for greater than 3 years) and likely secondary to arachnoid granulation. No suspicious lesion. No fracture. Sinuses/Orbits: Prior right cataract resection.  No acute findings. Other: None. IMPRESSION: 1.  No acute intracranial abnormality. 2. Unchanged atrophy and chronic small vessel ischemia Electronically Signed   By: Jeb Levering M.D.   On: 04/29/2018 05:56    Procedures Procedures (including critical care time)  Medications Ordered in ED Medications  ondansetron (ZOFRAN-ODT) disintegrating tablet 4 mg (4 mg Oral Given 04/28/18 2142)     Initial  Impression / Assessment and Plan / ED Course  I have reviewed the triage vital signs and the nursing notes.  Pertinent labs & imaging results that were available during my care of the patient were reviewed by me and considered in my medical decision making (see chart for details).  Clinical Course as of Apr 29 756  Sat Apr 29, 2018  0443 Echo 12/2017: Left ventricle: The cavity size was normal. Systolic function was normal. The estimated ejection fraction was in the range of 60% to 65%. Wall motion was normal; there were no regional wall motion abnormalities. The study was not technically sufficient to allow evaluation of LV diastolic dysfunction due to atrial fibrillation. - Aortic valve: Trileaflet; mildly thickened, mildly calcified leaflets. Sclerosis without stenosis. - Mitral valve: There was mild to moderate regurgitation. - Left atrium: The atrium was normal in size. - Right ventricle: Systolic function was normal. - Tricuspid valve: There was mild regurgitation. - Pulmonic valve: There was mild regurgitation. - Pulmonary arteries: Systolic pressure was within the normal range. - Inferior vena cava: The vessel was normal in size. - Pericardium, extracardiac: There was no pericardial effusion.  Impressions: - When compared to the prior study  from 08/20/2015 LVEF has improved from 50% to 60-65%.   [HM]  6599 Pt denies chest pain at this time.  Denies SOB, nausea, sweating at this time.    [HM]    Clinical Course User Index [HM] Lora Chavers, Gwenlyn Perking    Presents with multiple complaints.  She gives a vague history and complaints today have changed multiple times.  She reports acute on chronic back pain which is been present for the last 3 months.   Patient with initial complaints of chest pain.  EKG is without acute ischemia and her troponin is negative.  Discussed the situation with her daughter who reports increasing confusion over the last few weeks to months.  She  reports recurrent agitation.  Altered mental status work-up initiated.  CT scan without acute abnormality, including no evidence of intracranial bleed.  I personally evaluated these images.  Chest x-ray without pneumonia, pulmonary edema or pneumothorax.  I personally evaluated these images as well.  Patient does have anemia however this appears to be baseline.  Her lactic acid is normal.  She is afebrile without tachycardia or hypotension.  No evidence of sirs or sepsis as the cause for her altered mental status.  Urinalysis is without urinary tract infection.  Ethanol level is less than 10 and drug screen is positive for opiates and benzodiazepines.  Plain films of her cervical, thoracic and lumbar spines are pending.  Long discussion with daughter about the situation and acute versus chronic confusion.  Daughter reports that she does not think she is more confused today.  She is willing to take her home.  At shift change, care signed out to Janetta Hora, PA-C pending imaging.  She will reevaluate, discussed with daughter again and disposition.  The patient was discussed with and seen by Dr. Christy Gentles who agrees with the treatment plan.   Final Clinical Impressions(s) / ED Diagnoses   Final diagnoses:  Other chest pain  Chronic midline low back pain without sciatica  Confusion    ED Discharge Orders    None       Agapito Games 04/29/18 0757    Ripley Fraise, MD 04/29/18 [phone]

## 2018-04-29 NOTE — ED Notes (Signed)
[place] PATIENT IS READY TO BE DISCHARGED [phone]

## 2018-04-29 NOTE — ED Notes (Signed)
Spoke w/ Sherri, Pt's daughter; she is coming from [place] to pick up pt, will be here in approx 1 hour

## 2018-04-29 NOTE — ED Notes (Signed)
Two unsuccessful attempts to start saline lock line.  

## 2018-04-30 LAB — URINE CULTURE

## 2018-05-01 ENCOUNTER — Telehealth: Payer: Self-pay

## 2018-05-01 ENCOUNTER — Other Ambulatory Visit: Payer: Self-pay | Admitting: Family Medicine

## 2018-05-01 DIAGNOSIS — F0391 Unspecified dementia with behavioral disturbance: Secondary | ICD-10-CM

## 2018-05-01 DIAGNOSIS — R413 Other amnesia: Secondary | ICD-10-CM

## 2018-05-01 NOTE — Telephone Encounter (Signed)
Phone call placed to patient's daughter, Freda Munro, to make her aware that NP will see patient in [place] and to offer to schedule a visit. VM left

## 2018-05-01 NOTE — Telephone Encounter (Signed)
New neuro referral request from Angel [name], daughter. Referral to Dr. Creig Hines made urgent, pended for Dr. Nonda Lou review and approval.  Copied from [place] [id number]. Topic: Referral - Request >> Apr 24, 2018  9:31 AM Angel [name] wrote: Reason for CRM: Angel [name] requesting Neurology referral to Dr. Creig Hines ph# [phone], [address], [place],  [address]. The referral sent to Neuropsychology was received but pt cannot be seen until 09/04/18. They feel a sooner appt is needed. Pt having memory decline. She needs 24 hour care - the pts 3 children are taking turns caring for her.  >> May 01, 2018  9:33 AM Angel [name] wrote: Angel [name] (daughter) called and said the number for the [facility] is [phone] (fax) she said that goes to [employer] attention [place]

## 2018-05-01 NOTE — Telephone Encounter (Signed)
I put a hospice/ palliative care referral in--- that was done last week.  I thought.  They would have been able to help with the pt symptoms I put referral in for neuro per their request

## 2018-05-01 NOTE — Telephone Encounter (Signed)
Pts daughter [name] is calling in and stating they need FL-2 form filled out she will call back with a fax number

## 2018-05-01 NOTE — Telephone Encounter (Signed)
Spoke with patient's daughter today to offer to schedule visit with Palliative Care. Daughter shared that patient is now living in [place] with her. Will verify if we will service this area.

## 2018-05-02 ENCOUNTER — Telehealth: Payer: Self-pay

## 2018-05-02 NOTE — Telephone Encounter (Signed)
Daughter checking status of FL2, please advise. CB [phone]

## 2018-05-02 NOTE — Telephone Encounter (Signed)
Phone call placed to patient's daughter to make her aware that Palliative NP does go to [place] and to schedule visit. Visit scheduled for Thursday 05/04/18 @ 11:00am

## 2018-05-03 ENCOUNTER — Inpatient Hospital Stay: Payer: Medicare Other

## 2018-05-03 ENCOUNTER — Inpatient Hospital Stay: Payer: Medicare Other | Attending: Oncology

## 2018-05-03 DIAGNOSIS — Z5111 Encounter for antineoplastic chemotherapy: Secondary | ICD-10-CM | POA: Insufficient documentation

## 2018-05-03 DIAGNOSIS — C50911 Malignant neoplasm of unspecified site of right female breast: Secondary | ICD-10-CM

## 2018-05-03 DIAGNOSIS — C7951 Secondary malignant neoplasm of bone: Secondary | ICD-10-CM | POA: Diagnosis not present

## 2018-05-03 DIAGNOSIS — M818 Other osteoporosis without current pathological fracture: Secondary | ICD-10-CM

## 2018-05-03 LAB — CBC WITH DIFFERENTIAL/PLATELET
BASOS PCT: 0 %
Basophils Absolute: 0 10*3/uL (ref 0.0–0.1)
EOS ABS: 0 10*3/uL (ref 0.0–0.5)
EOS PCT: 0 %
HCT: 36.7 % (ref 34.8–46.6)
HEMOGLOBIN: 11.4 g/dL — AB (ref 11.6–15.9)
LYMPHS ABS: 0.9 10*3/uL (ref 0.9–3.3)
Lymphocytes Relative: 12 %
MCH: 27.8 pg (ref 25.1–34.0)
MCHC: 31.1 g/dL — AB (ref 31.5–36.0)
MCV: 89.5 fL (ref 79.5–101.0)
MONO ABS: 0.3 10*3/uL (ref 0.1–0.9)
MONOS PCT: 4 %
Neutro Abs: 5.8 10*3/uL (ref 1.5–6.5)
Neutrophils Relative %: 84 %
PLATELETS: 241 10*3/uL (ref 145–400)
RBC: 4.1 MIL/uL (ref 3.70–5.45)
RDW: 15.6 % — AB (ref 11.2–14.5)
WBC: 7 10*3/uL (ref 3.9–10.3)

## 2018-05-03 LAB — COMPREHENSIVE METABOLIC PANEL
ALK PHOS: 35 U/L — AB (ref 38–126)
ALT: 12 U/L (ref 0–44)
AST: 13 U/L — AB (ref 15–41)
Albumin: 3.8 g/dL (ref 3.5–5.0)
Anion gap: 8 (ref 5–15)
BUN: 16 mg/dL (ref 8–23)
CALCIUM: 8.7 mg/dL — AB (ref 8.9–10.3)
CHLORIDE: 100 mmol/L (ref 98–111)
CO2: 28 mmol/L (ref 22–32)
CREATININE: 0.95 mg/dL (ref 0.44–1.00)
GFR calc Af Amer: >60 mL/min (ref >60)
GFR calc non Af Amer: 54 mL/min — ABNORMAL LOW (ref >60)
GLUCOSE: 97 mg/dL (ref 70–99)
Potassium: 5.1 mmol/L (ref 3.5–5.1)
SODIUM: 136 mmol/L (ref 135–145)
Total Bilirubin: 0.3 mg/dL (ref 0.3–1.2)
Total Protein: 6.8 g/dL (ref 6.5–8.1)

## 2018-05-03 MED ORDER — FULVESTRANT 250 MG/5ML IM SOLN
500.0000 mg | Freq: Once | INTRAMUSCULAR | Status: AC
  Administered 2018-05-03: 500 mg via INTRAMUSCULAR

## 2018-05-03 MED ORDER — FULVESTRANT 250 MG/5ML IM SOLN
INTRAMUSCULAR | Status: AC
  Filled 2018-05-03: qty 10

## 2018-05-03 NOTE — Telephone Encounter (Signed)
Spoke with daughter [name].  She stated that an attorney advised that they get an FL2 form PCP stating that she needs 24 hour care and cannot take care of herself.  I advised her that I will check into [place] filling out a form without an facility.  They do not have a facility in mind.  I also advised that we are working on referrals for neuro to Dr. Ermalene Postin and palliative care.  Will talk with Ivin Booty about FL2 form.  If we cannot get this taken care of we will advise patient to go to [facility] for evaluation and speak with Education officer, museum.

## 2018-05-04 ENCOUNTER — Other Ambulatory Visit: Payer: Medicare Other | Admitting: Nurse Practitioner

## 2018-05-04 ENCOUNTER — Encounter: Payer: Self-pay | Admitting: *Deleted

## 2018-05-04 DIAGNOSIS — F03918 Unspecified dementia, unspecified severity, with other behavioral disturbance: Secondary | ICD-10-CM | POA: Insufficient documentation

## 2018-05-04 DIAGNOSIS — Z515 Encounter for palliative care: Secondary | ICD-10-CM

## 2018-05-04 DIAGNOSIS — F0391 Unspecified dementia with behavioral disturbance: Secondary | ICD-10-CM

## 2018-05-04 LAB — CULTURE, BLOOD (ROUTINE X 2)
CULTURE: NO GROWTH
CULTURE: NO GROWTH
Special Requests: ADEQUATE

## 2018-05-04 NOTE — Telephone Encounter (Signed)
Spoke with [name] advised her that pallative should have contacted her sister on Tuesday.  Also advised that she contact social services to see if they can help get her mother placed somewhere.

## 2018-05-04 NOTE — Progress Notes (Signed)
PALLIATIVE CARE CONSULT VISIT   PATIENT NAME: Angel [name] DOB: 1932/07/02 MRN: [id number]  PRIMARY CARE PROVIDER:   Ann Held, DO  REFERRING PROVIDER:  Carollee Herter, Alferd Apa, DO [address] [place], [place] [address]  RESPONSIBLE PARTY:   Dara Lords (daughter) [phone]   HISTORY OF PRESENT ILLNESS:  Angel [name] is a 84 y.o. year old female with multiple medical problems including dementia with behavioral disturbance, verbal and physical aggression, recent psychiatric admission, recurrent right breast cancer (of chest wall), currently receiving fulvestrant,CAD, aFib. Palliative Care was asked to help address goals of care.   Patient with verbal and physical aggression towards family. During this visit the patient was very irritated and angry thinking that her daughter and this writer was "trying to put her away". Patient stated , before I go to a nursing home "I have a plan". When I asked the daughter what she meant she said that the patient has said in the past the she would commit suicide. Daughter has hidden locked gun and and patient's medications are also locked and out of reach.Because of her confusion patient requires 24/7 observation. She was recently hospitalized in geriatric psych unit, but was discharged after 3 days. Daughter reports that behaviors have not improved. Daughter would like patient placed as she cannot care for her at home and she works full-time. I educated daughter on Palliative care and what type of support we provide. Daughter needs help with control of patient's aggression and long-term care placement.   ASSESSMENT/RECOMMENDATIONS   Dementia with behavioral disturbance-verbal and physical aggression towards family -continues to be aggressive; may need further inpatient hospitalization to stabilize patient then transfer to long-term care facility/?memory care -patient is ambulatory and able to perform most of her  own ADLS; appetite is good -continue quetiapine -continue supportive care   Recurrent of right breast CA (now in chest wall); s/p b/l mastectomies, radiation, chemotherapy -followed by oncology patient on anti-estrogen therapy to prevent progression.  Non-obstructive CAD, mild mR -new onset of atrial fibrillaiton -chemically converted to NSR; not candidate for long-term AC due to anemia (on heparin gtt)  ACP -patient full code at this time; not addressed this visit; immediate needs related to patient's behavior's and need for placement   I spent 60 minutes providing this consultation,  from 11:00 to 12:00. More than 50% of the time in this consultation was spent coordinating communication.     CODE STATUS: full  PPS: TBD at later date HOSPICE ELIGIBILITY/DIAGNOSIS: TBD  PAST MEDICAL HISTORY:  Past Medical History:  Diagnosis Date  . Anxiety   . Asthma   . Atrial fibrillation with RVR ([facility]) 12/2017  . Breast cancer ([facility]) 05/07/1999, 06/2011   a. s/p bilat mastectomies. recurrence 01/2014. Radiation, meds.  . Cancer of chest (wall) ([facility])   . Cataract    eye surgery planned for 11/2014  . Chest wall recurrence of breast cancer ([facility]) 2016  . Cholelithiasis   . Chronic bronchitis   . COPD (chronic obstructive pulmonary disease) ([facility])   . Coronary artery disease Non-obstructive   a. 2012 Cath: LAD 40-78m->Med Rx;  b. 12/2013 [facility]i MV: EF 74%, no ischemia. c. Cath 10/2014 - 50-70% mLAD but visually felt unchanged from prior, med rx recommended as sx were atypical.  . DDD (degenerative disc disease)   . Degenerative joint disease   . Depression   . Dyslipidemia   . GERD (gastroesophageal reflux disease)   . Hx  of radiation therapy 07/02/1999- 08/11/1999   right supraclavucular/axillary region: 5040 cGy, 28 fractions  . Hx of radiation therapy 02/20/14- 03/27/14   right chest wall 4500 cGy 25 sessions  . Hypertension   . Kidney stones    one stones  . Mitral regurgitation    a. mild-mod  by echo 12/2017.  . Osteoarthritis   . Osteoporosis   . Pre-diabetes   . PVC's (premature ventricular contractions)   . Reflux esophagitis   . Skin cancer of face    non melanoma  . Spinal stenosis     SOCIAL HX:  Social History   Tobacco Use  . Smoking status: Never Smoker  . Smokeless tobacco: Never Used  Substance Use Topics  . Alcohol use: Yes    Comment: occ wine    ALLERGIES:  Allergies  Allergen Reactions  . Adhesive [Tape] Rash and Other (See Comments)    Pt prefers to use paper tape  . Gabapentin Other (See Comments)    Confusion   . Penicillins Rash    Has patient had a PCN reaction causing immediate rash, facial/tongue/throat swelling, SOB or lightheadedness with hypotension: Yes Has patient had a PCN reaction causing severe rash involving mucus membranes or skin necrosis: No Has patient had a PCN reaction that required hospitalization No Has patient had a PCN reaction occurring within the last 10 years: No If all of the above answers are "NO", then may proceed with Cephalosporin use.      PERTINENT MEDICATIONS:  Outpatient Encounter Medications as of 05/04/2018  Medication Sig  . acetaminophen (TYLENOL) 325 MG tablet Take 2 tablets (650 mg total) by mouth every 4 (four) hours as needed for headache or mild pain.  [name] albuterol (PROVENTIL HFA;VENTOLIN HFA) 108 (90 Base) MCG/ACT inhaler Inhale 1-2 puffs into the lungs every 4 (four) hours as needed for wheezing or shortness of breath.  [name] amiodarone (PACERONE) 200 MG tablet Take 1 tablet (200 mg total) by mouth daily.  [name] aspirin 81 MG chewable tablet Chew 81 mg by mouth daily.  [name] atorvastatin (LIPITOR) 20 MG tablet TAKE 1 TABLET BY MOUTH ONCE DAILY  . budesonide-formoterol (SYMBICORT) 160-4.5 MCG/ACT inhaler Inhale 2 puffs into the lungs 2 (two) times daily.  . Cholecalciferol (VITAMIN D-3) 5000 UNITS TABS Take 5,000 Units by mouth daily.   . diazepam (VALIUM) 5 MG tablet Take 1 tablet (5 mg total) by mouth at  bedtime as needed for anxiety.  . DULoxetine (CYMBALTA) 60 MG capsule TAKE 1 CAPSULE BY MOUTH  EVERY DAY FOR MOOD.  [name] ezetimibe (ZETIA) 10 MG tablet Take 1 tablet (10 mg total) by mouth daily.  . furosemide (LASIX) 20 MG tablet Take 1 tablet (20 mg total) by mouth daily as needed for edema.  [name] HYDROcodone-acetaminophen (NORCO) 10-325 MG tablet Take 1 tablet by mouth every 4 (four) hours as needed for severe pain.  . Hypromellose (ARTIFICIAL TEARS OP) Place 1 drop into both eyes 3 (three) times daily as needed (for dry eyes).   . montelukast (SINGULAIR) 10 MG tablet Take 1 tablet (10 mg total) by mouth at bedtime.  . Multiple Vitamin (MULTIVITAMIN WITH MINERALS) TABS tablet Take 1 tablet by mouth daily.  . nitroGLYCERIN (NITROSTAT) 0.4 MG SL tablet Place 1 tablet (0.4 mg total) under the tongue as needed. (Patient taking differently: Place 0.4 mg under the tongue every 5 (five) minutes as needed for chest pain. )  . Omega-3 Fatty Acids (FISH OIL) 1000 MG CAPS Take 1,000 mg by  mouth daily.  . ondansetron (ZOFRAN ODT) 4 MG disintegrating tablet Take 1 tablet (4 mg total) by mouth every 8 (eight) hours as needed for nausea or vomiting.  . pantoprazole (PROTONIX) 40 MG tablet Take 1 tablet (40 mg total) by mouth daily.  . polyethylene glycol (MIRALAX / GLYCOLAX) packet Take 17 g by mouth daily.  . potassium chloride (K-DUR) 10 MEQ tablet TAKE 1 TABLET BY MOUTH ONLY WHEN YOU TAKE A LASIX  . senna-docusate (SENOKOT-S) 8.6-50 MG tablet Take 1 tablet by mouth at bedtime.  . sodium phosphate (FLEET) 7-19 GM/118ML ENEM Place 133 mLs (1 enema total) rectally daily as needed for severe constipation.  . vitamin B-12 (CYANOCOBALAMIN) 500 MCG tablet Take 1 tablet (500 mcg total) by mouth daily.   No facility-administered encounter medications on file as of 05/04/2018.     PHYSICAL EXAM:   General: NAD,  Cardiovascular: regular rate and rhythm Pulmonary: clear ant fields Abdomen: soft, nontender, + bowel  sounds GU: no suprapubic tenderness Extremities: no edema, no joint deformities Skin: no rashes Neurological: nonfocal  Adonias Demore G [place], NP

## 2018-05-08 ENCOUNTER — Telehealth: Payer: Self-pay | Admitting: Family Medicine

## 2018-05-08 DIAGNOSIS — J4520 Mild intermittent asthma, uncomplicated: Secondary | ICD-10-CM

## 2018-05-08 MED ORDER — MONTELUKAST SODIUM 10 MG PO TABS: 10.0000 mg | ORAL_TABLET | Freq: Every day | ORAL | 1 refills | Status: AC

## 2018-05-08 NOTE — Telephone Encounter (Signed)
Patient has appointment 05/12/18- can review for further refills at that appointment

## 2018-05-08 NOTE — Telephone Encounter (Signed)
Copied from [place] [id number]. Topic: Quick Communication - Rx Refill/Question >> May 08, 2018  8:32 AM Bea Graff, NT wrote: Medication: montelukast (SINGULAIR) 10 MG tablet   Has the patient contacted their pharmacy? Yes.   (Agent: If no, request that the patient contact the pharmacy for the refill.) (Agent: If yes, when and what did the pharmacy advise?)  Preferred Pharmacy (with phone number or street name): [place], [place] - [address] [phone] (Phone) [phone] (Fax)    Agent: Please be advised that RX refills may take up to 3 business days. We ask that you follow-up with your pharmacy.

## 2018-05-08 NOTE — Telephone Encounter (Signed)
rx sent in 

## 2018-05-12 ENCOUNTER — Encounter: Payer: Self-pay | Admitting: Family Medicine

## 2018-05-12 ENCOUNTER — Telehealth: Payer: Self-pay | Admitting: *Deleted

## 2018-05-12 ENCOUNTER — Ambulatory Visit (INDEPENDENT_AMBULATORY_CARE_PROVIDER_SITE_OTHER): Payer: Medicare Other | Admitting: Family Medicine

## 2018-05-12 VITALS — BP 141/76 | HR 71 | Temp 97.9°F | Resp 16 | Ht 62.0 in | Wt 132.0 lb

## 2018-05-12 DIAGNOSIS — F0391 Unspecified dementia with behavioral disturbance: Secondary | ICD-10-CM | POA: Diagnosis not present

## 2018-05-12 MED ORDER — DONEPEZIL HCL 5 MG PO TABS: 5.0000 mg | ORAL_TABLET | Freq: Every day | ORAL | 2 refills | Status: DC

## 2018-05-12 MED ORDER — QUETIAPINE FUMARATE 25 MG PO TABS: 12.5000 mg | ORAL_TABLET | Freq: Every day | ORAL | 0 refills | Status: DC

## 2018-05-12 NOTE — Patient Instructions (Signed)
Dementia Caregiver Guide Dementia is a term used to describe a number of symptoms that affect memory and thinking. The most common symptoms include:  Memory loss.  Trouble with language and communication.  Trouble concentrating.  Poor judgment.  Problems with reasoning.  Child-like behavior and language.  Extreme anxiety.  Angry outbursts.  Wandering from home or public places.  Dementia usually gets worse slowly over time. In the early stages, people with dementia can stay independent and safe with some help. In later stages, they need help with daily tasks such as dressing, grooming, and using the bathroom. How to help the person with dementia cope Dementia can be frightening and confusing. Here are some tips to help the person with dementia cope with changes caused by the disease. General tips  Keep the person on track with his or her routine.  Try to identify areas where the person may need help.  Be supportive, patient, calm, and encouraging.  Gently remind the person that adjusting to changes takes time.  Help with the tasks that the person has asked for help with.  Keep the person involved in daily tasks and decisions as much as possible.  Encourage conversation, but try not to get frustrated or harried if the person struggles to find words or does not seem to appreciate your help. Communication tips  When the person is talking or seems frustrated, make eye contact and hold the person's hand.  Ask specific questions that need yes or no answers.  Use simple words, short sentences, and a calm voice. Only give one direction at a time.  When offering choices, limit them to just 1 or 2.  Avoid correcting the person in a negative way.  If the person is struggling to find the right words, gently try to help him or her. How to recognize symptoms of stress Symptoms of stress in caregivers include:  Feeling frustrated or angry with the person with  dementia.  Denying that the person has dementia or that his or her symptoms will not improve.  Feeling hopeless and unappreciated.  Difficulty sleeping.  Difficulty concentrating.  Feeling anxious, irritable, or depressed.  Developing stress-related health problems.  Feeling like you have too little time for your own life.  Follow these instructions at home:  Make sure that you and the person you are caring for: ? Get regular sleep. ? Exercise regularly. ? Eat regular, nutritious meals. ? Drink enough fluid to keep your urine clear or pale yellow. ? Take over-the-counter and prescription medicines only as told by your health care providers. ? Attend all scheduled health care appointments.  Join a support group with others who are caregivers.  Ask about respite care resources so that you can have a regular break from the stress of caregiving.  Look for signs of stress in yourself and in the person you are caring for. If you notice signs of stress, take steps to manage it.  Consider any safety risks and take steps to avoid them.  Organize medications in a pill box for each day of the week.  Create a plan to handle any legal or financial matters. Get legal or financial advice if needed.  Keep a calendar in a central location to remind the person of appointments or other activities. Tips for reducing the risk of injury  Keep floors clear of clutter. Remove rugs, magazine racks, and floor lamps.  Keep hallways well lit, especially at night.  Put a handrail and nonslip mat in the bathtub   or shower.  Put childproof locks on cabinets that contain dangerous items, such as medicines, alcohol, guns, toxic cleaning items, sharp tools or utensils, matches, and lighters.  Put the locks in places where the person cannot see or reach them easily. This will help ensure that the person does not wander out of the house and get lost.  Be prepared for emergencies. Keep a list of  emergency phone numbers and addresses in a convenient area.  Remove car keys and lock garage doors so that the person does not try to get in the car and drive.  Have the person wear a bracelet that tracks locations and identifies the person as having memory problems. This should be worn at all times for safety. Where to find support: Many individuals and organizations offer support. These include:  Support groups for people with dementia and for caregivers.  Counselors or therapists.  Home health care services.  Adult day care centers.  Where to find more information: Alzheimer's Association: www.alz.org Contact a health care provider if:  The person's health is rapidly getting worse.  You are no longer able to care for the person.  Caring for the person is affecting your physical and emotional health.  The person threatens himself or herself, you, or anyone else. Summary  Dementia is a term used to describe a number of symptoms that affect memory and thinking.  Dementia usually gets worse slowly over time.  Take steps to reduce the person's risk of injury, and to plan for future care.  Caregivers need support, relief from caregiving, and time for their own lives. This information is not intended to replace advice given to you by your health care provider. Make sure you discuss any questions you have with your health care provider. Document Released: 08/17/2016 Document Revised: 08/17/2016 Document Reviewed: 08/17/2016 Elsevier Interactive Patient Education  2018 [employer]  

## 2018-05-12 NOTE — Telephone Encounter (Signed)
Daughter Freda Munro checking the status of referral to neurologist?
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
## 2018-05-22 ENCOUNTER — Encounter: Payer: Self-pay | Admitting: Family Medicine

## 2018-05-25 NOTE — Telephone Encounter (Signed)
Patient daughter [name] is calling to request a update on a medication that will  Help her mother with sleep. She stated they are looking into guardianship and waiting on a court date. She is requesting a call back at [phone]. Please advise

## 2018-05-31 ENCOUNTER — Telehealth: Payer: Self-pay | Admitting: *Deleted

## 2018-05-31 ENCOUNTER — Inpatient Hospital Stay: Payer: Medicare Other

## 2018-05-31 ENCOUNTER — Inpatient Hospital Stay: Payer: Medicare Other | Attending: Oncology

## 2018-05-31 VITALS — BP 135/72 | HR 72 | Temp 98.0°F | Resp 18

## 2018-05-31 DIAGNOSIS — C50911 Malignant neoplasm of unspecified site of right female breast: Secondary | ICD-10-CM | POA: Insufficient documentation

## 2018-05-31 DIAGNOSIS — M818 Other osteoporosis without current pathological fracture: Secondary | ICD-10-CM

## 2018-05-31 DIAGNOSIS — Z5111 Encounter for antineoplastic chemotherapy: Secondary | ICD-10-CM | POA: Diagnosis not present

## 2018-05-31 LAB — CBC WITH DIFFERENTIAL/PLATELET
BASOS PCT: 1 %
Basophils Absolute: 0 10*3/uL (ref 0.0–0.1)
EOS ABS: 0 10*3/uL (ref 0.0–0.5)
Eosinophils Relative: 1 %
HCT: 37.7 % (ref 34.8–46.6)
HEMOGLOBIN: 12.1 g/dL (ref 11.6–15.9)
Lymphocytes Relative: 22 %
Lymphs Abs: 1.3 10*3/uL (ref 0.9–3.3)
MCH: 27.3 pg (ref 25.1–34.0)
MCHC: 32 g/dL (ref 31.5–36.0)
MCV: 85.2 fL (ref 79.5–101.0)
Monocytes Absolute: 0.4 10*3/uL (ref 0.1–0.9)
Monocytes Relative: 6 %
NEUTROS PCT: 70 %
Neutro Abs: 4.1 10*3/uL (ref 1.5–6.5)
Platelets: 235 10*3/uL (ref 145–400)
RBC: 4.42 MIL/uL (ref 3.70–5.45)
RDW: 15.2 % — ABNORMAL HIGH (ref 11.2–14.5)
WBC: 5.9 10*3/uL (ref 3.9–10.3)

## 2018-05-31 LAB — COMPREHENSIVE METABOLIC PANEL
ALK PHOS: 41 U/L (ref 38–126)
ALT: 12 U/L (ref 0–44)
AST: 19 U/L (ref 15–41)
Albumin: 4.2 g/dL (ref 3.5–5.0)
Anion gap: 8 (ref 5–15)
BUN: 14 mg/dL (ref 8–23)
CALCIUM: 9.7 mg/dL (ref 8.9–10.3)
CHLORIDE: 101 mmol/L (ref 98–111)
CO2: 28 mmol/L (ref 22–32)
CREATININE: 0.95 mg/dL (ref 0.44–1.00)
GFR calc non Af Amer: 54 mL/min — ABNORMAL LOW (ref >60)
Glucose, Bld: 91 mg/dL (ref 70–99)
Potassium: 4.5 mmol/L (ref 3.5–5.1)
Sodium: 137 mmol/L (ref 135–145)
Total Bilirubin: 0.3 mg/dL (ref 0.3–1.2)
Total Protein: 7.1 g/dL (ref 6.5–8.1)

## 2018-05-31 MED ORDER — FULVESTRANT 250 MG/5ML IM SOLN
INTRAMUSCULAR | Status: AC
  Filled 2018-05-31: qty 10

## 2018-05-31 MED ORDER — FULVESTRANT 250 MG/5ML IM SOLN
500.0000 mg | Freq: Once | INTRAMUSCULAR | Status: AC
  Administered 2018-05-31: 500 mg via INTRAMUSCULAR

## 2018-05-31 MED ORDER — DIAZEPAM 5 MG PO TABS: 5.0000 mg | ORAL_TABLET | Freq: Every evening | ORAL | 0 refills | Status: DC | PRN

## 2018-05-31 NOTE — Telephone Encounter (Signed)
Pt came in for monthly injection and requested refills on valium and pain medications.  Per Valium refill pt is requesting an increase from nightly to TID dosing.  This RN reviewed pt's chart and noted she was recently seen by her primary MD Dr Etter Sjogren with new changes in medications with noted history of agitation.  Per PMP aware - pain medications was last dispensed on 05/22/2018 and ordered by the patient's orthopedist Dr Suella Broad.  This RN obtained refill for Valium at current dosing of 1 tab po nightly for 1 month supply and spoke with the patient.  Per discussion this RN informed the patient :  Dose of a drug like Valium cannot be increased unless she is seen by the provider -as well as due to recent medication changes by Dr Etter Sjogren - if she is feeling increased anxiety or agitation she should contact his office and discuss. Pt is scheduled for follow up at this office Oct 2 with Wilber Bihari NP  Pain medication refills need to be obtained thru Dr Nelva Bush.  Angel [name] verbalized understanding including " I didn't think about the new medication may be causing some of my issues - I will call Dr Etter Sjogren"  No further needs at this time.

## 2018-06-01 ENCOUNTER — Other Ambulatory Visit: Payer: Self-pay | Admitting: Family Medicine

## 2018-06-01 MED ORDER — DIAZEPAM 5 MG PO TABS: 5.0000 mg | ORAL_TABLET | Freq: Three times a day (TID) | ORAL | 0 refills | Status: DC | PRN

## 2018-06-01 NOTE — Telephone Encounter (Signed)
I have increased valium to tid prn --- we can consider inc seroquel as well but see how valium does first

## 2018-06-01 NOTE — Telephone Encounter (Signed)
Is she taking a whole seroquel 25 mg ?  --- try to inc to 50 mg at night

## 2018-06-01 NOTE — Telephone Encounter (Signed)
I have increased the valium to tid prn We may consider inc seroquel as well but we will see have valium does

## 2018-06-20 ENCOUNTER — Other Ambulatory Visit: Payer: Self-pay | Admitting: Family Medicine

## 2018-06-21 DIAGNOSIS — R413 Other amnesia: Secondary | ICD-10-CM | POA: Diagnosis not present

## 2018-06-21 DIAGNOSIS — G301 Alzheimer's disease with late onset: Secondary | ICD-10-CM | POA: Diagnosis not present

## 2018-06-27 DIAGNOSIS — M5030 Other cervical disc degeneration, unspecified cervical region: Secondary | ICD-10-CM | POA: Diagnosis not present

## 2018-06-27 DIAGNOSIS — M5136 Other intervertebral disc degeneration, lumbar region: Secondary | ICD-10-CM | POA: Diagnosis not present

## 2018-06-27 NOTE — Progress Notes (Signed)
[place]  [phone] [phone]   ID: Angel [name] DOB: [birth year]  MR#: [id number]  ZJI#:[id number]  Patient Care Team: Carollee Herter, Alferd Apa, DO as PCP - General (Family Medicine) [name], MD as PCP - Cardiology (Cardiology) Malika Demario, Virgie Dad, MD as Consulting Physician (Oncology) [name], MD as Consulting Physician (Cardiology) OTHER M.D.: Maisie Fus Kurup (ophth)  CHIEF COMPLAINT: Chest wall recurrence of breast cancer  CURRENT TREATMENT: Observation  BREAST CANCER HISTORY: Per Dr. 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 She had a second right breast cancer in 2012, with right mastectomy by Dr [name] 07-29-2011 for T1N0M0 ER/PR positive, HER-2 negative invasive ductal carcinoma; she also had what was planned as prophylactic left mastectomy, with unexpected finding of left DCIS in that pathology. Plan in 09-2011 was to begin tamoxifen, chosen particularly due to low bone density. Patient did not keep follow up visits at this office, stopped tamoxifen due intolerance (tho she does not remember what problem she had with this) and was treated with raloxifene by Dr [name].  She was seen next at this office on 12-26-2013 with an enlarging nodular area above right mastectomy scar,reportedly present for several months. Excisional biopsy done 01-21-14 found grade 2 invasive ductal carcinoma with involvement at [facility]) and lymphovascular space invasion, ER positive 100%, PR positive 75%, proliferation marker 80% and HER-2 negative by [facility] (pathology [phone]). PET 02-15-14 did not identify any involvement beyond right chest wall. She had 4500 cGy as electron beam radiation from 5-27 thru 03-27-14 to right chest wall. By 04-30-14 there were multiple tiny nodules in region of right mastectomy scar."  The patient's  subsequent history is as detailed below  INTERVAL HISTORY: Angel [name] returns today for follow-up of her estrogen receptor positive breast cancer accompanied by her son-in-law. She receives fulvestrant every 28 days with a dose due today.  She generally tolerates that well  She also receives denosumab/Prolia every 6 months, with a dose due today.   However there has been some decline. She was seen in the ED on 04/28/2018 for chest pain and confusion. She completed a head CT on 04/29/2018 showing: No acute intracranial abnormality. Unchanged atrophy and chronic small vessel ischemia. She also completed a chest xray showing: No acute findings. Unchanged eventration of right hemidiaphragm.  Aortic Atherosclerosis (ICD10-I70.0).  Angel [name] is now living with her daughter Angel [name].  The family takes turns to give Angel [name] breaks but they feel Angel [name] pretty much needs 24/7 support.  In addition today just before the visit she went to the bathroom here and fell.  She tells me she forgot to use her cane.  According to the son-in-law there has been at least one more fall in the past few weeks.  The patient does not have a walker  REVIEW OF SYSTEMS: Angel [name] tells me she hit her head when she fell and also hit her lower back.  Currently she denies headaches, visual changes, nausea, vomiting, or pain.  She tells me she likes living with her daughter.  The family tells me that has been a significant cognitive decline in the last few months.  A detailed review of systems today was otherwise stable  PAST MEDICAL HISTORY: Past Medical History:  Diagnosis Date  . Anxiety   . Asthma   . Atrial fibrillation with RVR ([place]) 12/2017  . Breast cancer ([place]) 05/07/1999, 06/2011   a.  s/p bilat mastectomies. recurrence 01/2014. Radiation, meds.  . Cancer of chest (wall) ([place])   . Cataract    eye surgery planned for 11/2014  . Chest wall recurrence of breast cancer ([place]) 2016  . Cholelithiasis   . Chronic bronchitis   . COPD  (chronic obstructive pulmonary disease) ([place])   . Coronary artery disease Non-obstructive   a. 2012 Cath: LAD 40-28m>Med Rx;  b. 12/2013 Lexi MV: EF 74%, no ischemia. c. Cath 10/2014 - 50-70% mLAD but visually felt unchanged from prior, med rx recommended as sx were atypical.  . DDD (degenerative disc disease)   . Degenerative joint disease   . Depression   . Dyslipidemia   . GERD (gastroesophageal reflux disease)   . Hx of radiation therapy 07/02/1999- 08/11/1999   right supraclavucular/axillary region: 5040 cGy, 28 fractions  . Hx of radiation therapy 02/20/14- 03/27/14   right chest wall 4500 cGy 25 sessions  . Hypertension   . Kidney stones    one stones  . Mitral regurgitation    a. mild-mod by echo 12/2017.  . Osteoarthritis   . Osteoporosis   . Pre-diabetes   . PVC's (premature ventricular contractions)   . Reflux esophagitis   . Skin cancer of face    non melanoma  . Spinal stenosis     PAST SURGICAL HISTORY: Past Surgical History:  Procedure Laterality Date  . ABDOMINAL HYSTERECTOMY     in her 30's  . APPENDECTOMY    . BREAST BIOPSY Right 01/21/2014   Procedure: BREAST/CHEST WALL BIOPSY;  Surgeon: ARalene Ok MD;  [facility]  Service: General;  Laterality: Right;  . BREAST LUMPECTOMY  2002   right breast  . CARDIAC CATHETERIZATION  03/2011, 3/15  . COLONOSCOPY WITH PROPOFOL N/A 01/27/2018   Procedure: COLONOSCOPY WITH PROPOFOL;  Surgeon: GWonda Horner MD;  [facility]  Service: Endoscopy;  Laterality: N/A;  and EGD  . ESOPHAGOGASTRODUODENOSCOPY (EGD) WITH PROPOFOL N/A 01/27/2018   Procedure: ESOPHAGOGASTRODUODENOSCOPY (EGD) WITH PROPOFOL;  Surgeon: GWonda Horner MD;  [facility];  Service: Endoscopy;  Laterality: N/A;  . LEFT HEART CATHETERIZATION WITH CORONARY ANGIOGRAM N/A 12/05/2013   Procedure: LEFT HEART CATHETERIZATION WITH CORONARY ANGIOGRAM;  Surgeon: CBurnell Blanks MD;  [facility];  Service: Cardiovascular;   Laterality: N/A;  . LEFT HEART CATHETERIZATION WITH CORONARY ANGIOGRAM N/A 11/06/2014   Procedure: LEFT HEART CATHETERIZATION WITH CORONARY ANGIOGRAM;  Surgeon: HSinclair Grooms MD;  [facility];  Service: Cardiovascular;  Laterality: N/A;  . MASTECTOMY  07/29/11   bilateral-rt nodes-none on left  . SMALL INTESTINE SURGERY  1998   SBO  . TONSILLECTOMY    . TUBAL LIGATION  1961  . VEIN LIGATION AND STRIPPING Right   . VESICOVAGINAL FISTULA CLOSURE W/ TAH  age 84   FAMILY HISTORY Family History  Problem Relation Age of Onset  . Pancreatic cancer Mother   . Cancer Mother        pancreatic  . Asthma Daughter   . Stroke Daughter   . Hypertension Daughter   . Heart disease Father   . Emphysema Father   . Lymphoma Brother   . Asthma Brother   . Asthma Grandchild   . Asthma Brother        deceased  . Heart disease Brother    the patient's father died at the age of 641from heart disease. He was a smoker. The patient's mother died at the age of  86 with cancer of the pancreas. The patient has 3 brothers, no sisters. There is no history of breast or ovarian cancer in the family to her knowledge.  GYNECOLOGIC HISTORY:  No LMP recorded. Patient has had a hysterectomy. Menarche age 44, first live birth age 36. She is GX P4. She stopped having periods in her 39s. She took hormone replacement for approximately 10 years. 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 She has been "single" for 40 years. Her oldest daughter died from a subarachnoid hemorrhage at age 77, 2 years ago. This is when the patient had undergone treatment for invasive breast cancer and she tells me she was simply overwhelmed and could not start the planned antiestrogen therapy. The next daughter, Angel [name], is a retired Software engineer. She lives in [place]. Next child, Angel [name], works in  Charity fundraiser. She also lives in [place]. The youngest, Angel [name], is retired from the Office manager position. He also owned his own business. The patient has 7 grandchildren and 2 many great-grandchildren to count. She attends the [employer]    ADVANCED DIRECTIVES: In place; the patient's daughter Angel [name] is her healthcare power of attorney. She can be reached at [place]: Social History   Tobacco Use  . Smoking status: Never Smoker  . Smokeless tobacco: Never Used  Substance Use Topics  . Alcohol use: Yes    Comment: occ wine  . Drug use: No     Colonoscopy:  PAP:  Bone density: August 2015   Lipid panel:  Allergies  Allergen Reactions  . Adhesive [Tape] Rash and Other (See Comments)    Pt prefers to use paper tape  . Gabapentin Other (See Comments)    Confusion   . Penicillins Rash    Has patient had a PCN reaction causing immediate rash, facial/tongue/throat swelling, SOB or lightheadedness with hypotension: Yes Has patient had a PCN reaction causing severe rash involving mucus membranes or skin necrosis: No Has patient had a PCN reaction that required hospitalization No Has patient had a PCN reaction occurring within the last 10 years: No If all of the above answers are "NO", then may proceed with Cephalosporin use.     No current facility-administered medications for this visit.    Current Outpatient Medications  Medication Sig Dispense Refill  . acetaminophen (TYLENOL) 325 MG tablet Take 2 tablets (650 mg total) by mouth every 4 (four) hours as needed for headache or mild pain.    Angel Kitchen albuterol (PROVENTIL HFA;VENTOLIN HFA) 108 (90 Base) MCG/ACT inhaler Inhale 1-2 puffs into the lungs every 4 (four) hours as needed for wheezing or shortness of breath. 1 Inhaler 0  . amiodarone (PACERONE) 200 MG tablet Take 1 tablet (200 mg total) by mouth daily. 90 tablet 3  . aspirin 81 MG chewable tablet Chew 81 mg by mouth daily.    Angel Kitchen atorvastatin  (LIPITOR) 20 MG tablet TAKE 1 TABLET BY MOUTH ONCE DAILY 90 tablet 3  . budesonide-formoterol (SYMBICORT) 160-4.5 MCG/ACT inhaler Inhale 2 puffs into the lungs 2 (two) times daily. 3 Inhaler 1  . Cholecalciferol (VITAMIN D-3) 5000 UNITS TABS Take 5,000 Units by mouth daily.     . diazepam (VALIUM) 5 MG tablet Take 1 tablet (5 mg total) by mouth every 8 (eight) hours as needed for anxiety or sedation. 60 tablet 0  . donepezil (ARICEPT) 5 MG tablet Take 1  tablet (5 mg total) by mouth at bedtime. 30 tablet 2  . DULoxetine (CYMBALTA) 60 MG capsule TAKE 1 CAPSULE BY MOUTH ONCE DAILY FOR  MOOD 90 capsule 0  . ezetimibe (ZETIA) 10 MG tablet Take 1 tablet (10 mg total) by mouth daily.    . furosemide (LASIX) 20 MG tablet Take 1 tablet (20 mg total) by mouth daily as needed for edema. 90 tablet 3  . HYDROcodone-acetaminophen (NORCO) 10-325 MG tablet Take 1 tablet by mouth every 4 (four) hours as needed for severe pain. 6 tablet 0  . Hypromellose (ARTIFICIAL TEARS OP) Place 1 drop into both eyes 3 (three) times daily as needed (for dry eyes).     . montelukast (SINGULAIR) 10 MG tablet Take 1 tablet (10 mg total) by mouth at bedtime. 90 tablet 1  . Multiple Vitamin (MULTIVITAMIN WITH MINERALS) TABS tablet Take 1 tablet by mouth daily.    . nitroGLYCERIN (NITROSTAT) 0.4 MG SL tablet Place 1 tablet (0.4 mg total) under the tongue as needed. (Patient taking differently: Place 0.4 mg under the tongue every 5 (five) minutes as needed for chest pain. ) 25 tablet 3  . Omega-3 Fatty Acids (FISH OIL) 1000 MG CAPS Take 1,000 mg by mouth daily.    . ondansetron (ZOFRAN ODT) 4 MG disintegrating tablet Take 1 tablet (4 mg total) by mouth every 8 (eight) hours as needed for nausea or vomiting. 20 tablet 4  . pantoprazole (PROTONIX) 40 MG tablet Take 1 tablet (40 mg total) by mouth daily. 90 tablet 1  . polyethylene glycol (MIRALAX / GLYCOLAX) packet Take 17 g by mouth daily. 14 each 0  . potassium chloride (K-DUR) 10 MEQ  tablet TAKE 1 TABLET BY MOUTH ONLY WHEN YOU TAKE A LASIX 90 tablet 3  . QUEtiapine (SEROQUEL) 25 MG tablet Take 0.5 tablets (12.5 mg total) by mouth at bedtime. 30 tablet 0  . senna-docusate (SENOKOT-S) 8.6-50 MG tablet Take 1 tablet by mouth at bedtime. 30 tablet 0  . sodium phosphate (FLEET) 7-19 GM/118ML ENEM Place 133 mLs (1 enema total) rectally daily as needed for severe constipation.  0  . vitamin B-12 (CYANOCOBALAMIN) 500 MCG tablet Take 1 tablet (500 mcg total) by mouth daily. 30 tablet 0   Facility-Administered Medications Ordered in Other Visits  Medication Dose Route Frequency Provider Last Rate Last Dose  . morphine 4 MG/ML injection 4 mg  4 mg Intravenous Once Dorie Rank, MD        OBJECTIVE: Elderly white woman examined in a wheelchair  Vitals:   06/28/18 1128  BP: (!) 154/76  Pulse: 68  Resp: 18  Temp: 97.7 F (36.5 C)  SpO2: 94%     Body mass index is 24.14 kg/m.    ECOG FS:1 - Symptomatic but completely ambulatory  Sclerae unicteric, EOMs intact Oropharynx clear and moist No cervical or supraclavicular adenopathy Lungs no rales or rhonchi Heart regular rate and rhythm Abd soft, nontender, positive bowel sounds MSK no focal spinal tenderness, no upper extremity lymphedema Neuro: nonfocal, well oriented, appropriate affect Breasts: she is status post bilateral mastectomies with no evidence of active disease in the chest.  Both axillae are benign   LAB RESULTS:  CMP     Component Value Date/Time   NA 138 06/28/2018 1104   NA 137 03/27/2018 1126   NA 139 09/22/2017 1353   K 4.2 06/28/2018 1104   K 3.4 (L) 09/22/2017 1353   CL 102 06/28/2018 1104   CO2 30 06/28/2018  1104   CO2 27 09/22/2017 1353   GLUCOSE 141 (H) 06/28/2018 1104   GLUCOSE 108 09/22/2017 1353   BUN 14 06/28/2018 1104   BUN 16 03/27/2018 1126   BUN 8.5 09/22/2017 1353   CREATININE 0.91 06/28/2018 1104   CREATININE 0.8 09/22/2017 1353   CALCIUM 9.5 06/28/2018 1104   CALCIUM 9.5  09/22/2017 1353   PROT 6.7 06/28/2018 1104   PROT 6.8 09/22/2017 1353   ALBUMIN 3.6 06/28/2018 1104   ALBUMIN 4.0 09/22/2017 1353   AST 17 06/28/2018 1104   AST 19 09/22/2017 1353   ALT 13 06/28/2018 1104   ALT 11 09/22/2017 1353   ALKPHOS 37 (L) 06/28/2018 1104   ALKPHOS 34 (L) 09/22/2017 1353   BILITOT 0.3 06/28/2018 1104   BILITOT 0.32 09/22/2017 1353   GFRNONAA 57 (L) 06/28/2018 1104   GFRNONAA 73 05/01/2014 1050   GFRAA >60 06/28/2018 1104   GFRAA 84 05/01/2014 1050    I No results found for: SPEP  Lab Results  Component Value Date   WBC 10.5 (H) 06/28/2018   NEUTROABS 9.2 (H) 06/28/2018   HGB 10.9 (L) 06/28/2018   HCT 35.0 06/28/2018   MCV 87.9 06/28/2018   PLT 222 06/28/2018      Chemistry      Component Value Date/Time   NA 138 06/28/2018 1104   NA 137 03/27/2018 1126   NA 139 09/22/2017 1353   K 4.2 06/28/2018 1104   K 3.4 (L) 09/22/2017 1353   CL 102 06/28/2018 1104   CO2 30 06/28/2018 1104   CO2 27 09/22/2017 1353   BUN 14 06/28/2018 1104   BUN 16 03/27/2018 1126   BUN 8.5 09/22/2017 1353   CREATININE 0.91 06/28/2018 1104   CREATININE 0.8 09/22/2017 1353      Component Value Date/Time   CALCIUM 9.5 06/28/2018 1104   CALCIUM 9.5 09/22/2017 1353   ALKPHOS 37 (L) 06/28/2018 1104   ALKPHOS 34 (L) 09/22/2017 1353   AST 17 06/28/2018 1104   AST 19 09/22/2017 1353   ALT 13 06/28/2018 1104   ALT 11 09/22/2017 1353   BILITOT 0.3 06/28/2018 1104   BILITOT 0.32 09/22/2017 1353       Lab Results  Component Value Date   LABCA2 12 11/26/2015    No components found for: [id number]  No results for input(s): INR in the last 168 hours.  Urinalysis    Component Value Date/Time   COLORURINE COLORLESS (A) 04/29/2018 0607   APPEARANCEUR CLEAR 04/29/2018 0607   LABSPEC 1.004 (L) 04/29/2018 0607   PHURINE 6.0 04/29/2018 0607   GLUCOSEU NEGATIVE 04/29/2018 0607   HGBUR NEGATIVE 04/29/2018 0607   BILIRUBINUR NEGATIVE 04/29/2018 0607   BILIRUBINUR neg  04/25/2015 1015   KETONESUR NEGATIVE 04/29/2018 0607   PROTEINUR NEGATIVE 04/29/2018 0607   UROBILINOGEN 0.2 04/25/2015 1015   UROBILINOGEN 0.2 02/14/2015 2010   NITRITE NEGATIVE 04/29/2018 0607   LEUKOCYTESUR NEGATIVE 04/29/2018 0258    STUDIES: Dg Lumbar Spine Complete  Result Date: 06/28/2018 CLINICAL DATA:  Fall today.  Low back pain. EXAM: LUMBAR SPINE - COMPLETE 4+ VIEW COMPARISON:  Plain film of the lumbar spine dated 04/29/2018. FINDINGS: Alignment is stable, with mild dextroscoliosis. There is no fracture line or displaced fracture fragment seen. No evidence of acute compression fracture. Facet joints appear stable in alignment. Again noted is degenerative facet hypertrophy within the lower lumbar spine, mild to moderate in degree. Atherosclerotic changes seen along the walls of the infrarenal abdominal aorta. Visualized  paravertebral soft tissues are otherwise unremarkable. IMPRESSION: 1. No acute findings.  No evidence of acute fracture or dislocation. 2. Stable chronic/degenerative changes, as detailed above. 3.  Aortic Atherosclerosis (ICD10-I70.0). Electronically Signed   By: Franki Cabot M.D.   On: 06/28/2018 13:25   Dg Sacrum/coccyx  Result Date: 06/28/2018 CLINICAL DATA:  Fall today, low back pain and tailbone pain. EXAM: SACRUM AND COCCYX - 2+ VIEW COMPARISON:  None. FINDINGS: There is no evidence of fracture or other focal bone lesions. IMPRESSION: Negative. Electronically Signed   By: Franki Cabot M.D.   On: 06/28/2018 13:26   Ct Head Wo Contrast  Result Date: 06/28/2018 CLINICAL DATA:  84 year old female status post fall in cancer center bathroom striking head and back. Breast cancer history. EXAM: CT HEAD WITHOUT CONTRAST CT CERVICAL SPINE WITHOUT CONTRAST TECHNIQUE: Multidetector CT imaging of the head and cervical spine was performed following the standard protocol without intravenous contrast. Multiplanar CT image reconstructions of the cervical spine were also  generated. COMPARISON:  Cervical spine radiographs and noncontrast head CT 04/29/2018, and earlier. FINDINGS: CT HEAD FINDINGS Brain: Stable cerebral volume. No midline shift, ventriculomegaly, mass effect, evidence of mass lesion, intracranial hemorrhage or evidence of cortically based acute infarction. Stable and normal for age gray-white matter differentiation throughout the brain. Vascular: Calcified atherosclerosis at the skull base. No suspicious intracranial vascular hyperdensity. Skull: Stable, No acute osseous abnormality identified. Left greater than right TMJ degeneration. Central skull base osteopenia. Sinuses/Orbits: Visualized paranasal sinuses and mastoids are stable and well pneumatized. Other: Stable and negative orbits soft tissues.  No scalp hematoma. CT CERVICAL SPINE FINDINGS Alignment: Stable cervical lordosis. Mild degenerative appearing anterolisthesis of C5 on C6 is stable with associated facet degeneration. Similar mild anterolisthesis of C7 on T1. Bilateral posterior element alignment is within normal limits. Skull base and vertebrae: Visualized skull base is intact. No atlanto-occipital dissociation. No cervical spine fracture. Soft tissues and spinal canal: No prevertebral fluid or swelling. No visible canal hematoma. Negative noncontrast neck soft tissues. In the posterior upper back there is trace new gas along the upper thoracic spinous processes (series 7 image 77 and sagittal image 24. The regional soft tissues otherwise appear normal. Disc levels: Disc space loss and frequent facet degeneration but no cervical spinal stenosis. Upper chest: Visible upper thoracic levels appear intact. Negative lung apices. Tortuous proximal great vessels. IMPRESSION: 1. No acute traumatic injury identified in the head or cervical spine. 2. Trace soft tissue gas along the upper thoracic spinous processes is nonspecific. Query regional soft tissue laceration or penetrating trauma. 3. Stable and  negative for age noncontrast CT appearance of the brain. Electronically Signed   By: Genevie Ann M.D.   On: 06/28/2018 13:38   Ct Cervical Spine Wo Contrast  Result Date: 06/28/2018 CLINICAL DATA:  84 year old female status post fall in cancer center bathroom striking head and back. Breast cancer history. EXAM: CT HEAD WITHOUT CONTRAST CT CERVICAL SPINE WITHOUT CONTRAST TECHNIQUE: Multidetector CT imaging of the head and cervical spine was performed following the standard protocol without intravenous contrast. Multiplanar CT image reconstructions of the cervical spine were also generated. COMPARISON:  Cervical spine radiographs and noncontrast head CT 04/29/2018, and earlier. FINDINGS: CT HEAD FINDINGS Brain: Stable cerebral volume. No midline shift, ventriculomegaly, mass effect, evidence of mass lesion, intracranial hemorrhage or evidence of cortically based acute infarction. Stable and normal for age gray-white matter differentiation throughout the brain. Vascular: Calcified atherosclerosis at the skull base. No suspicious intracranial vascular hyperdensity. Skull: Stable,  No acute osseous abnormality identified. Left greater than right TMJ degeneration. Central skull base osteopenia. Sinuses/Orbits: Visualized paranasal sinuses and mastoids are stable and well pneumatized. Other: Stable and negative orbits soft tissues.  No scalp hematoma. CT CERVICAL SPINE FINDINGS Alignment: Stable cervical lordosis. Mild degenerative appearing anterolisthesis of C5 on C6 is stable with associated facet degeneration. Similar mild anterolisthesis of C7 on T1. Bilateral posterior element alignment is within normal limits. Skull base and vertebrae: Visualized skull base is intact. No atlanto-occipital dissociation. No cervical spine fracture. Soft tissues and spinal canal: No prevertebral fluid or swelling. No visible canal hematoma. Negative noncontrast neck soft tissues. In the posterior upper back there is trace new gas along  the upper thoracic spinous processes (series 7 image 77 and sagittal image 24. The regional soft tissues otherwise appear normal. Disc levels: Disc space loss and frequent facet degeneration but no cervical spinal stenosis. Upper chest: Visible upper thoracic levels appear intact. Negative lung apices. Tortuous proximal great vessels. IMPRESSION: 1. No acute traumatic injury identified in the head or cervical spine. 2. Trace soft tissue gas along the upper thoracic spinous processes is nonspecific. Query regional soft tissue laceration or penetrating trauma. 3. Stable and negative for age noncontrast CT appearance of the brain. Electronically Signed   By: Genevie Ann M.D.   On: 06/28/2018 13:38    ASSESSMENT: 84 y.o. [place] woman with locally recurrent breast cancer, as follows:  (1) status post right lumpectomy and sentinel node sampling September 2000 for ductal carcinoma in situ (Ni+), estrogen and progesterone receptor positive, with a low proliferation index,  (a) received 5040 cGy to the right breast and regional lymph nodes  (b) on tamoxifen less than 3 months  (2) s/p bilateral mastectomies and right axillary lymph node sampling 07/29/2011, showing  (a) in the left breast, ductal carcinoma in situ measuring 1.2 cm, grade 1, with negative margins, estrogen and progesterone receptor positive  (b)  on the right, a pT1c pN0, stage IA invasive ductal carcinoma, estrogen receptor and 93% positive, progesterone receptor 87% positive, with an MIB-1 of 35%, and no HER-2 amplification margins were close but negative  (c) did not receive adjuvant antiestrogen therapy  (3) right chest wall skin recurrence resected 01/21/2014, measuring 1.7 cm, with positive margins, estrogen receptor 100% positive, progesterone receptor 75% positive, with an MIB-1 of 80%, and no HER-2 amplification; PET scan 02/15/2014 negative except for Right chest wall focus  (a) received 45 Gy electron-beam therapy to the right chest  wall completed 03/27/2014  (b) skin progression within and without the radiation field noted 04/30/2014  (c) letrozole started 05/13/2014, discontinued March 2016 with progression   (4) dexa scan 05/09/2014 at [facility] shows osteoporosis with a T score of -2.9  (5) Fulvestrant started 12/25/2014, held after 0904 dose, to start observation  (6) osteopenia, on DEXA scan 05/09/2014  (a) started Denosumab/prolia 08/06/2015, repeated every 24 weeks  (7) Chronic pain: Followed by Dr. Nelva Bush  PLAN: I am concerned that Sadae has had a significant decline since her last visit here.  We sent her to the emergency room for further evaluation and thankfully no fracture or bleed was noted.  At this point however, 3-1/2 years out since any evidence of active cancer, I think a period of observation may be considered.  We are accordingly stopping the fulvestrant, and scheduling Angel [name] to see [place] again in 6 months.  If there has been further decline we will consider releasing her to her primary physician or  further observation.  Obviously if there is any evidence of disease activity in the chest or elsewhere at that time we will resume the fulvestrant.  She did have a slightly elevated white cell count today and even though she had no symptoms suggestive of a UTI I think it would be prudent to give her 3 days of antibiotics.  These have been called in  The family knows to call for any other issues that may develop before Katelyne's next visit.   Angel [name], Virgie Dad, MD  06/28/18 12:55 PM Medical Oncology and Hematology [facility] [address] [place], [place] [address] Tel. [phone]    Fax. [phone]  Alice Rieger, am acting as scribe for [name] MD.  I, Lurline Del MD, have reviewed the above documentation for accuracy and completeness, and I agree with the above.

## 2018-06-28 ENCOUNTER — Emergency Department (HOSPITAL_COMMUNITY)
Admission: EM | Admit: 2018-06-28 | Discharge: 2018-06-28 | Disposition: A | Discharge status: Home / Self Care | Payer: Medicare Other | Attending: Emergency Medicine | Admitting: Emergency Medicine

## 2018-06-28 ENCOUNTER — Inpatient Hospital Stay: Payer: Medicare Other

## 2018-06-28 ENCOUNTER — Encounter (HOSPITAL_COMMUNITY): Payer: Self-pay

## 2018-06-28 ENCOUNTER — Other Ambulatory Visit: Payer: Self-pay

## 2018-06-28 ENCOUNTER — Telehealth: Payer: Self-pay | Admitting: Oncology

## 2018-06-28 ENCOUNTER — Inpatient Hospital Stay: Payer: Medicare Other | Attending: Oncology

## 2018-06-28 ENCOUNTER — Encounter: Payer: Self-pay | Admitting: Oncology

## 2018-06-28 ENCOUNTER — Emergency Department (HOSPITAL_COMMUNITY): Payer: Medicare Other

## 2018-06-28 ENCOUNTER — Inpatient Hospital Stay (HOSPITAL_BASED_OUTPATIENT_CLINIC_OR_DEPARTMENT_OTHER): Payer: Medicare Other | Admitting: Oncology

## 2018-06-28 ENCOUNTER — Telehealth: Payer: Self-pay | Admitting: *Deleted

## 2018-06-28 ENCOUNTER — Other Ambulatory Visit: Payer: Self-pay | Admitting: *Deleted

## 2018-06-28 VITALS — BP 154/76 | HR 68 | Temp 97.7°F | Resp 18 | Ht 62.0 in

## 2018-06-28 DIAGNOSIS — R7303 Prediabetes: Secondary | ICD-10-CM | POA: Insufficient documentation

## 2018-06-28 DIAGNOSIS — Z853 Personal history of malignant neoplasm of breast: Secondary | ICD-10-CM | POA: Diagnosis not present

## 2018-06-28 DIAGNOSIS — R51 Headache: Secondary | ICD-10-CM | POA: Insufficient documentation

## 2018-06-28 DIAGNOSIS — I10 Essential (primary) hypertension: Secondary | ICD-10-CM | POA: Diagnosis not present

## 2018-06-28 DIAGNOSIS — Z7982 Long term (current) use of aspirin: Secondary | ICD-10-CM | POA: Insufficient documentation

## 2018-06-28 DIAGNOSIS — F0390 Unspecified dementia without behavioral disturbance: Secondary | ICD-10-CM | POA: Diagnosis not present

## 2018-06-28 DIAGNOSIS — Z923 Personal history of irradiation: Secondary | ICD-10-CM | POA: Insufficient documentation

## 2018-06-28 DIAGNOSIS — J449 Chronic obstructive pulmonary disease, unspecified: Secondary | ICD-10-CM | POA: Diagnosis not present

## 2018-06-28 DIAGNOSIS — Z79899 Other long term (current) drug therapy: Secondary | ICD-10-CM | POA: Insufficient documentation

## 2018-06-28 DIAGNOSIS — C50811 Malignant neoplasm of overlapping sites of right female breast: Secondary | ICD-10-CM

## 2018-06-28 DIAGNOSIS — I2510 Atherosclerotic heart disease of native coronary artery without angina pectoris: Secondary | ICD-10-CM | POA: Diagnosis not present

## 2018-06-28 DIAGNOSIS — M8580 Other specified disorders of bone density and structure, unspecified site: Secondary | ICD-10-CM | POA: Insufficient documentation

## 2018-06-28 DIAGNOSIS — M549 Dorsalgia, unspecified: Secondary | ICD-10-CM | POA: Insufficient documentation

## 2018-06-28 DIAGNOSIS — Z85828 Personal history of other malignant neoplasm of skin: Secondary | ICD-10-CM | POA: Diagnosis not present

## 2018-06-28 DIAGNOSIS — W01198A Fall on same level from slipping, tripping and stumbling with subsequent striking against other object, initial encounter: Secondary | ICD-10-CM | POA: Diagnosis not present

## 2018-06-28 DIAGNOSIS — R2681 Unsteadiness on feet: Secondary | ICD-10-CM

## 2018-06-28 DIAGNOSIS — C50911 Malignant neoplasm of unspecified site of right female breast: Secondary | ICD-10-CM

## 2018-06-28 DIAGNOSIS — S3992XA Unspecified injury of lower back, initial encounter: Secondary | ICD-10-CM | POA: Diagnosis not present

## 2018-06-28 DIAGNOSIS — Z170 Estrogen receptor positive status [ER+]: Secondary | ICD-10-CM

## 2018-06-28 DIAGNOSIS — W19XXXA Unspecified fall, initial encounter: Secondary | ICD-10-CM

## 2018-06-28 DIAGNOSIS — S0990XA Unspecified injury of head, initial encounter: Secondary | ICD-10-CM | POA: Diagnosis not present

## 2018-06-28 DIAGNOSIS — M545 Low back pain: Secondary | ICD-10-CM | POA: Diagnosis not present

## 2018-06-28 DIAGNOSIS — S199XXA Unspecified injury of neck, initial encounter: Secondary | ICD-10-CM | POA: Diagnosis not present

## 2018-06-28 LAB — CBC WITH DIFFERENTIAL/PLATELET
Basophils Absolute: 0 10*3/uL (ref 0.0–0.1)
Basophils Relative: 0 %
Eosinophils Absolute: 0 10*3/uL (ref 0.0–0.5)
Eosinophils Relative: 0 %
HEMATOCRIT: 35 % (ref 34.8–46.6)
HEMOGLOBIN: 10.9 g/dL — AB (ref 11.6–15.9)
LYMPHS PCT: 9 %
Lymphs Abs: 0.9 10*3/uL (ref 0.9–3.3)
MCH: 27.4 pg (ref 25.1–34.0)
MCHC: 31.1 g/dL — AB (ref 31.5–36.0)
MCV: 87.9 fL (ref 79.5–101.0)
MONO ABS: 0.4 10*3/uL (ref 0.1–0.9)
MONOS PCT: 4 %
NEUTROS ABS: 9.2 10*3/uL — AB (ref 1.5–6.5)
Neutrophils Relative %: 87 %
Platelets: 222 10*3/uL (ref 145–400)
RBC: 3.98 MIL/uL (ref 3.70–5.45)
RDW: 14.7 % — AB (ref 11.2–14.5)
WBC: 10.5 10*3/uL — ABNORMAL HIGH (ref 3.9–10.3)

## 2018-06-28 LAB — COMPREHENSIVE METABOLIC PANEL
ALBUMIN: 3.6 g/dL (ref 3.5–5.0)
ALT: 13 U/L (ref 0–44)
ANION GAP: 6 (ref 5–15)
AST: 17 U/L (ref 15–41)
Alkaline Phosphatase: 37 U/L — ABNORMAL LOW (ref 38–126)
BUN: 14 mg/dL (ref 8–23)
CHLORIDE: 102 mmol/L (ref 98–111)
CO2: 30 mmol/L (ref 22–32)
Calcium: 9.5 mg/dL (ref 8.9–10.3)
Creatinine, Ser: 0.91 mg/dL (ref 0.44–1.00)
GFR calc non Af Amer: 57 mL/min — ABNORMAL LOW (ref >60)
GLUCOSE: 141 mg/dL — AB (ref 70–99)
Potassium: 4.2 mmol/L (ref 3.5–5.1)
SODIUM: 138 mmol/L (ref 135–145)
Total Bilirubin: 0.3 mg/dL (ref 0.3–1.2)
Total Protein: 6.7 g/dL (ref 6.5–8.1)

## 2018-06-28 MED ORDER — MORPHINE SULFATE (PF) 4 MG/ML IV SOLN
4.0000 mg | Freq: Once | INTRAVENOUS | Status: AC
  Administered 2018-06-28: 4 mg via INTRAVENOUS
  Filled 2018-06-28: qty 1

## 2018-06-28 MED ORDER — FULVESTRANT 250 MG/5ML IM SOLN
INTRAMUSCULAR | Status: AC
Start: 2018-06-28 — End: ?
  Filled 2018-06-28: qty 10

## 2018-06-28 MED ORDER — DENOSUMAB 60 MG/ML ~~LOC~~ SOSY
PREFILLED_SYRINGE | SUBCUTANEOUS | Status: AC
Start: 2018-06-28 — End: ?
  Filled 2018-06-28: qty 1

## 2018-06-28 MED ORDER — PANTOPRAZOLE SODIUM 40 MG PO TBEC: 40.0000 mg | DELAYED_RELEASE_TABLET | Freq: Every day | ORAL | 1 refills | Status: DC

## 2018-06-28 NOTE — Telephone Encounter (Signed)
Per 10/2 los, no new orders.

## 2018-06-28 NOTE — ED Notes (Signed)
Bed: WA08 Expected date:  Expected time:  Means of arrival:  Comments: Cancer Center/fall

## 2018-06-28 NOTE — Discharge Instructions (Signed)
Continue your current medications, follow-up with your oncologist as planned

## 2018-06-28 NOTE — ED Triage Notes (Addendum)
Pt was going to appt at cancer center.  Pt fell in cancer center bathroom and hit her head and back.  Pt c/o of headache 6/10 and back pain 7/10.  Pt  Hx breast cancer and had a double mastectomy 5 years ago.  Pt has new tumor on left side, and is being treated with estrogen blocker.

## 2018-06-28 NOTE — ED Notes (Signed)
Could not obtain signature for D/C.  Pt's chart was still open at cancer center.

## 2018-06-28 NOTE — ED Notes (Signed)
Patient transported to CT 

## 2018-06-28 NOTE — Progress Notes (Signed)
Pt daughter [name] called and would like to notify Dr.Magrinat that she will need a prescription for wheeled walker sent to [facility] in [place].  Fax # [phone] Phone: [phone]. Will fax DME supply script today.

## 2018-06-28 NOTE — Telephone Encounter (Signed)
Patient with unwitnessed fall in main lobby bathroom.  Visitor in adjacent stall pulled emergency call for assist upon hearing fall.  Patient reports headache, right arm and buttock discomfort.  "Areas hit.  I missed commode trying to sit to pee. I had to pee.  Where is my cane.  I should have used it."  Kasandra Knudsen currently outside restroom,  held by son-in-law at time.  Luberta B Andre purse hanging from from back door of stall.      Escorted by Nurse Tech via wheel chair after nurse assist for today's scheduled F/U.

## 2018-06-28 NOTE — ED Notes (Signed)
Patient transported back from CT 

## 2018-06-28 NOTE — ED Provider Notes (Signed)
Angel [name]   CSN: [id number] Arrival date & time: 06/28/18  1153     History   Chief Complaint Chief Complaint  Patient presents with  . Fall  . Headache  . Back Pain    HPI Angel [name] is a 84 y.o. female.  HPI Pt was at the cancer center today.  She is supposed to use a cane.  SHe was walking to the bathroom without her cane.  SHe went to the bathroom and missed the commode and ended up falling against the wall.  She hit her back and her head.  SHe had to call for assistance because she was not able to get up on her own.  She is now having pain in her back and head.  She was got dazed but did not fully lose consciousness. SHe is not on any anticoagulants.  She was sent from the cancer center to the ED for evaluation.  Past Medical History:  Diagnosis Date  . Anxiety   . Asthma   . Atrial fibrillation with RVR ([place]) 12/2017  . Breast cancer ([place]) 05/07/1999, 06/2011   a. s/p bilat mastectomies. recurrence 01/2014. Radiation, meds.  . Cancer of chest (wall) ([place])   . Cataract    eye surgery planned for 11/2014  . Chest wall recurrence of breast cancer ([place]) 2016  . Cholelithiasis   . Chronic bronchitis   . COPD (chronic obstructive pulmonary disease) ([place])   . Coronary artery disease Non-obstructive   a. 2012 Cath: LAD 40-66m->Med Rx;  b. 12/2013 Lexi MV: EF 74%, no ischemia. c. Cath 10/2014 - 50-70% mLAD but visually felt unchanged from prior, med rx recommended as sx were atypical.  . DDD (degenerative disc disease)   . Degenerative joint disease   . Depression   . Dyslipidemia   . GERD (gastroesophageal reflux disease)   . Hx of radiation therapy 07/02/1999- 08/11/1999   right supraclavucular/axillary region: 5040 cGy, 28 fractions  . Hx of radiation therapy 02/20/14- 03/27/14   right chest wall 4500 cGy 25 sessions  . Hypertension   . Kidney stones    one stones  . Mitral regurgitation    a. mild-mod by echo  12/2017.  . Osteoarthritis   . Osteoporosis   . Pre-diabetes   . PVC's (premature ventricular contractions)   . Reflux esophagitis   . Skin cancer of face    non melanoma  . Spinal stenosis     Patient Active Problem List   Diagnosis Date Noted  . Dementia with behavioral disturbance ([place]) 05/04/2018  . Edema 03/24/2018  . PUD (peptic ulcer disease) 03/02/2018  . H/O: GI bleed 03/02/2018  . Memory loss 03/02/2018  . Acute post-hemorrhagic anemia 01/25/2018  . Atrial fibrillation with RVR ([place]) 01/20/2018  . Spinal stenosis   . Skin cancer of face   . Reflux esophagitis   . PVC's (premature ventricular contractions)   . Pre-diabetes   . Osteoarthritis   . Kidney stones   . Hx of radiation therapy   . GERD (gastroesophageal reflux disease)   . Depression   . Degenerative joint disease   . Coronary artery disease   . COPD (chronic obstructive pulmonary disease) ([place])   . Cholelithiasis   . Cataract   . Cancer of chest (wall) ([place])   . Anxiety   . Malignant neoplasm of overlapping sites of right breast in female, estrogen receptor positive ([place]) 10/21/2016  . Osteoporosis 02/18/2016  . Corneal scar,  left eye 12/31/2015  . Frequent falls 08/02/2015  . DOE (dyspnea on exertion) 08/02/2015  . Anxiety and depression 07/23/2015  . Anemia of chronic disease 07/23/2015  . Pain in the chest   . Nuclear sclerosis of left eye 07/17/2015  . Cystoid macular edema of right eye 11/14/2014  . Chest pain 11/01/2014  . Dyslipidemia   . Chest wall recurrence of breast cancer ([place]) 09/27/2014  . Recurrent cancer of right breast ([place]) 05/29/2014  . Essential hypertension 05/01/2014  . Epiretinal membrane, right eye 08/22/2013  . Lamellar macular hole of right eye 08/22/2013  . Chronic pain 02/11/2011  . Asthma     Past Surgical History:  Procedure Laterality Date  . ABDOMINAL HYSTERECTOMY     in her 30's  . APPENDECTOMY    . BREAST BIOPSY Right 01/21/2014   Procedure:  BREAST/CHEST WALL BIOPSY;  Surgeon: Angel Ok, Angel [name];  Location: [facility];  Service: General;  Laterality: Right;  . BREAST LUMPECTOMY  2002   right breast  . CARDIAC CATHETERIZATION  03/2011, 3/15  . COLONOSCOPY WITH PROPOFOL N/A 01/27/2018   Procedure: COLONOSCOPY WITH PROPOFOL;  Surgeon: Angel Horner, Angel [name];  Location[facility] ENDOSCOPY;  Service: Endoscopy;  Laterality: N/A;  and EGD  . ESOPHAGOGASTRODUODENOSCOPY (EGD) WITH PROPOFOL N/A 01/27/2018   Procedure: ESOPHAGOGASTRODUODENOSCOPY (EGD) WITH PROPOFOL;  Surgeon: Angel Horner, Angel [name];  Location: [facility] ENDOSCOPY;  Service: Endoscopy;  Laterality: N/A;  . LEFT HEART CATHETERIZATION WITH CORONARY ANGIOGRAM N/A 12/05/2013   Procedure: LEFT HEART CATHETERIZATION WITH CORONARY ANGIOGRAM;  Surgeon: Angel Blanks, Angel [name];  Location: [facility] CATH LAB;  Service: Cardiovascular;  Laterality: N/A;  . LEFT HEART CATHETERIZATION WITH CORONARY ANGIOGRAM N/A 11/06/2014   Procedure: LEFT HEART CATHETERIZATION WITH CORONARY ANGIOGRAM;  Surgeon: Angel Grooms, Angel [name];  Location: [facility] CATH LAB;  Service: Cardiovascular;  Laterality: N/A;  . MASTECTOMY  07/29/11   bilateral-rt nodes-none on left  . SMALL INTESTINE SURGERY  1998   SBO  . TONSILLECTOMY    . TUBAL LIGATION  1961  . VEIN LIGATION AND STRIPPING Right   . VESICOVAGINAL FISTULA CLOSURE W/ TAH  age 42     OB History   None      Home Medications    Prior to Admission medications   Medication Sig Start Date End Date Taking? Authorizing Provider  acetaminophen (TYLENOL) 325 MG tablet Take 2 tablets (650 mg total) by mouth every 4 (four) hours as needed for headache or mild pain. 02/08/18  Yes Geradine Girt, DO  amiodarone (PACERONE) 200 MG tablet Take 1 tablet (200 mg total) by mouth daily. 04/03/18  Yes [name], Angel [name]  aspirin 81 MG chewable tablet Chew 162 mg by mouth daily.    Yes Provider, Historical, Angel [name]  atorvastatin (LIPITOR) 20 MG tablet TAKE 1 TABLET BY MOUTH ONCE DAILY 03/20/18  Yes Turner, Eber Hong, Angel [name]  budesonide-formoterol (SYMBICORT) 160-4.5 MCG/ACT inhaler Inhale 2 puffs into the lungs 2 (two) times daily. 02/08/18  Yes Geradine Girt, DO  Cholecalciferol (VITAMIN D-3) 5000 UNITS TABS Take 5,000 Units by mouth daily.    Yes Provider, Historical, Angel [name]  diazepam (VALIUM) 5 MG tablet Take 1 tablet (5 mg total) by mouth every 8 (eight) hours as needed for anxiety or sedation. Patient taking differently: Take 5 mg by mouth 2 (two) times daily as needed for anxiety or sedation.  06/01/18 07/01/18 Yes Roma Schanz R, DO  donepezil (ARICEPT) 5 MG tablet Take 1 tablet (5 mg  total) by mouth at bedtime. 05/12/18  Yes Roma Schanz R, DO  DULoxetine (CYMBALTA) 60 MG capsule TAKE 1 CAPSULE BY MOUTH ONCE DAILY FOR  MOOD 06/20/18  Yes Roma Schanz R, DO  furosemide (LASIX) 20 MG tablet Take 1 tablet (20 mg total) by mouth daily as needed for edema. 03/17/18 06/28/18 Yes Bhagat, Bhavinkumar, PA  HYDROcodone-acetaminophen (NORCO) 10-325 MG tablet Take 1 tablet by mouth every 4 (four) hours as needed for severe pain. Patient taking differently: Take 2 tablets by mouth every 6 (six) hours as needed for severe pain.  02/08/18  Yes Vann, Jessica U, DO  Hypromellose (ARTIFICIAL TEARS OP) Place 1 drop into both eyes 3 (three) times daily as needed (for dry eyes).    Yes Provider, Historical, Angel [name]  montelukast (SINGULAIR) 10 MG tablet Take 1 tablet (10 mg total) by mouth at bedtime. 05/08/18  Yes [name] Held, DO  Multiple Vitamin (MULTIVITAMIN WITH MINERALS) TABS tablet Take 1 tablet by mouth daily.   Yes Provider, Historical, Angel [name]  Omega-3 Fatty Acids (FISH OIL) 1000 MG CAPS Take 1,000 mg by mouth daily.   Yes Provider, Historical, Angel [name]  oxyCODONE-acetaminophen (PERCOCET/ROXICET) 5-325 MG tablet Take 1 tablet by mouth daily. At 3 pm 04/06/18  Yes Provider, Historical, Angel [name]  pantoprazole (PROTONIX) 40 MG tablet Take 1 tablet (40 mg total) by mouth daily. 06/28/18  Yes Roma Schanz R, DO    polyethylene glycol (MIRALAX / GLYCOLAX) packet Take 17 g by mouth daily. 01/30/18  Yes Florencia Reasons, Angel [name]  potassium chloride (K-DUR) 10 MEQ tablet TAKE 1 TABLET BY MOUTH ONLY WHEN YOU TAKE A LASIX 03/17/18  Yes Bhagat, Bhavinkumar, PA  QUEtiapine (SEROQUEL) 25 MG tablet Take 0.5 tablets (12.5 mg total) by mouth at bedtime. 05/12/18  Yes Roma Schanz R, DO  QUEtiapine (SEROQUEL) 50 MG tablet Take 50-100 mg by mouth at bedtime as needed for sleep or agitation. 06/21/18  Yes Provider, Historical, Angel [name]  senna-docusate (SENOKOT-S) 8.6-50 MG tablet Take 1 tablet by mouth at bedtime. 01/30/18  Yes Florencia Reasons, Angel [name]  vitamin B-12 (CYANOCOBALAMIN) 500 MCG tablet Take 1 tablet (500 mcg total) by mouth daily. 01/30/18  Yes Florencia Reasons, Angel [name]  VITAMIN E PO Take by mouth.   Yes Provider, Historical, Angel [name]  albuterol (PROVENTIL HFA;VENTOLIN HFA) 108 (90 Base) MCG/ACT inhaler Inhale 1-2 puffs into the lungs every 4 (four) hours as needed for wheezing or shortness of breath. 03/07/18   [name] Held, DO  ezetimibe (ZETIA) 10 MG tablet Take 1 tablet (10 mg total) by mouth daily. Patient not taking: Reported on 06/28/2018 04/25/15   Carollee Herter, Alferd Apa, DO  naloxone [facility]) nasal spray 4 mg/0.1 mL Place 1 spray into the nose once.     Provider, Historical, Angel [name]  nitroGLYCERIN (NITROSTAT) 0.4 MG SL tablet Place 1 tablet (0.4 mg total) under the tongue as needed. Patient taking differently: Place 0.4 mg under the tongue every 5 (five) minutes as needed for chest pain.  11/23/17   [name], Angel [name]  ondansetron (ZOFRAN ODT) 4 MG disintegrating tablet Take 1 tablet (4 mg total) by mouth every 8 (eight) hours as needed for nausea or vomiting. Patient not taking: Reported on 06/28/2018 01/11/18   Magrinat, Virgie Dad, Angel [name]    Family History Family History  Problem Relation Age of Onset  . Pancreatic cancer Mother   . Cancer Mother        pancreatic  . Asthma Daughter   . Stroke  Daughter   . Hypertension Daughter   . Heart disease  Father   . Emphysema Father   . Lymphoma Brother   . Asthma Brother   . Asthma Grandchild   . Asthma Brother        deceased  . Heart disease Brother     Social History Social History   Tobacco Use  . Smoking status: Never Smoker  . Smokeless tobacco: Never Used  Substance Use Topics  . Alcohol use: Yes    Comment: occ wine  . Drug use: No     Allergies   Adhesive [tape]; Gabapentin; and Penicillins   Review of Systems Review of Systems  Constitutional: Negative for fever.  Respiratory: Negative for shortness of breath.   Cardiovascular: Negative for chest pain.  Gastrointestinal: Positive for nausea. Negative for abdominal pain, diarrhea and vomiting.  Musculoskeletal: Positive for back pain.  Neurological: Positive for headaches.  All other systems reviewed and are negative.    Physical Exam Updated Vital Signs BP 123/71 (BP Location: Right Arm)   Pulse 69   Temp 97.7 F (36.5 C) (Oral)   Resp 16   Wt 55.8 kg   SpO2 100%   BMI 22.50 kg/m   Physical Exam  Constitutional: No distress.  HENT:  Head: Normocephalic and atraumatic.  Right Ear: External ear normal.  Left Ear: External ear normal.  Eyes: Conjunctivae are normal. Right eye exhibits no discharge. Left eye exhibits no discharge. No scleral icterus.  Neck: Neck supple. No tracheal deviation present.  Cardiovascular: Normal rate, regular rhythm and intact distal pulses.  Pulmonary/Chest: Effort normal and breath sounds normal. No stridor. No respiratory distress. She has no wheezes. She has no rales.  Abdominal: Soft. Bowel sounds are normal. She exhibits no distension. There is no tenderness. There is no rebound and no guarding.  Musculoskeletal: She exhibits no edema.       Lumbar back: She exhibits tenderness and bony tenderness.  Neurological: She is alert. She has normal strength. No cranial nerve deficit (no facial droop, extraocular movements intact, no slurred speech) or sensory deficit.  She exhibits normal muscle tone. She displays no seizure activity. Coordination normal.  Skin: Skin is warm and dry. No rash noted.  Psychiatric: She has a normal mood and affect.  Nursing [name] and vitals reviewed.    ED Treatments / Results  Labs (all labs ordered are listed, but only abnormal results are displayed) Labs Reviewed - No data to display  EKG EKG Interpretation  Date/Time:  Wednesday June 28 2018 14:03:00 EDT Ventricular Rate:  70 PR Interval:    QRS Duration: 87 QT Interval:  439 QTC Calculation: 474 R Axis:   54 Text Interpretation:  Sinus rhythm Borderline prolonged PR interval Abnormal R-wave progression, early transition No significant change since last tracing Confirmed by Dorie Rank [phone]) on 06/28/2018 2:06:23 PM   Radiology Dg Lumbar Spine Complete  Result Date: 06/28/2018 CLINICAL DATA:  Fall today.  Low back pain. EXAM: LUMBAR SPINE - COMPLETE 4+ VIEW COMPARISON:  Plain film of the lumbar spine dated 04/29/2018. FINDINGS: Alignment is stable, with mild dextroscoliosis. There is no fracture line or displaced fracture fragment seen. No evidence of acute compression fracture. Facet joints appear stable in alignment. Again noted is degenerative facet hypertrophy within the lower lumbar spine, mild to moderate in degree. Atherosclerotic changes seen along the walls of the infrarenal abdominal aorta. Visualized paravertebral soft tissues are otherwise unremarkable. IMPRESSION: 1. No acute findings.  No evidence of  acute fracture or dislocation. 2. Stable chronic/degenerative changes, as detailed above. 3.  Aortic Atherosclerosis (ICD10-I70.0). Electronically Signed   By: Franki Cabot M.D.   On: 06/28/2018 13:25   Dg Sacrum/coccyx  Result Date: 06/28/2018 CLINICAL DATA:  Fall today, low back pain and tailbone pain. EXAM: SACRUM AND COCCYX - 2+ VIEW COMPARISON:  None. FINDINGS: There is no evidence of fracture or other focal bone lesions. IMPRESSION: Negative.  Electronically Signed   By: Franki Cabot M.D.   On: 06/28/2018 13:26   Ct Head Wo Contrast  Result Date: 06/28/2018 CLINICAL DATA:  84 year old female status post fall in cancer center bathroom striking head and back. Breast cancer history. EXAM: CT HEAD WITHOUT CONTRAST CT CERVICAL SPINE WITHOUT CONTRAST TECHNIQUE: Multidetector CT imaging of the head and cervical spine was performed following the standard protocol without intravenous contrast. Multiplanar CT image reconstructions of the cervical spine were also generated. COMPARISON:  Cervical spine radiographs and noncontrast head CT 04/29/2018, and earlier. FINDINGS: CT HEAD FINDINGS Brain: Stable cerebral volume. No midline shift, ventriculomegaly, mass effect, evidence of mass lesion, intracranial hemorrhage or evidence of cortically based acute infarction. Stable and normal for age gray-white matter differentiation throughout the brain. Vascular: Calcified atherosclerosis at the skull base. No suspicious intracranial vascular hyperdensity. Skull: Stable, No acute osseous abnormality identified. Left greater than right TMJ degeneration. Central skull base osteopenia. Sinuses/Orbits: Visualized paranasal sinuses and mastoids are stable and well pneumatized. Other: Stable and negative orbits soft tissues.  No scalp hematoma. CT CERVICAL SPINE FINDINGS Alignment: Stable cervical lordosis. Mild degenerative appearing anterolisthesis of C5 on C6 is stable with associated facet degeneration. Similar mild anterolisthesis of C7 on T1. Bilateral posterior element alignment is within normal limits. Skull base and vertebrae: Visualized skull base is intact. No atlanto-occipital dissociation. No cervical spine fracture. Soft tissues and spinal canal: No prevertebral fluid or swelling. No visible canal hematoma. Negative noncontrast neck soft tissues. In the posterior upper back there is trace new gas along the upper thoracic spinous processes (series 7 image 77 and  sagittal image 24. The regional soft tissues otherwise appear normal. Disc levels: Disc space loss and frequent facet degeneration but no cervical spinal stenosis. Upper chest: Visible upper thoracic levels appear intact. Negative lung apices. Tortuous proximal great vessels. IMPRESSION: 1. No acute traumatic injury identified in the head or cervical spine. 2. Trace soft tissue gas along the upper thoracic spinous processes is nonspecific. Query regional soft tissue laceration or penetrating trauma. 3. Stable and negative for age noncontrast CT appearance of the brain. Electronically Signed   By: Angel [name] M.D.   On: 06/28/2018 13:38   Ct Cervical Spine Wo Contrast  Result Date: 06/28/2018 CLINICAL DATA:  84 year old female status post fall in cancer center bathroom striking head and back. Breast cancer history. EXAM: CT HEAD WITHOUT CONTRAST CT CERVICAL SPINE WITHOUT CONTRAST TECHNIQUE: Multidetector CT imaging of the head and cervical spine was performed following the standard protocol without intravenous contrast. Multiplanar CT image reconstructions of the cervical spine were also generated. COMPARISON:  Cervical spine radiographs and noncontrast head CT 04/29/2018, and earlier. FINDINGS: CT HEAD FINDINGS Brain: Stable cerebral volume. No midline shift, ventriculomegaly, mass effect, evidence of mass lesion, intracranial hemorrhage or evidence of cortically based acute infarction. Stable and normal for age gray-white matter differentiation throughout the brain. Vascular: Calcified atherosclerosis at the skull base. No suspicious intracranial vascular hyperdensity. Skull: Stable, No acute osseous abnormality identified. Left greater than right TMJ degeneration. Central skull base osteopenia.  Sinuses/Orbits: Visualized paranasal sinuses and mastoids are stable and well pneumatized. Other: Stable and negative orbits soft tissues.  No scalp hematoma. CT CERVICAL SPINE FINDINGS Alignment: Stable cervical lordosis.  Mild degenerative appearing anterolisthesis of C5 on C6 is stable with associated facet degeneration. Similar mild anterolisthesis of C7 on T1. Bilateral posterior element alignment is within normal limits. Skull base and vertebrae: Visualized skull base is intact. No atlanto-occipital dissociation. No cervical spine fracture. Soft tissues and spinal canal: No prevertebral fluid or swelling. No visible canal hematoma. Negative noncontrast neck soft tissues. In the posterior upper back there is trace new gas along the upper thoracic spinous processes (series 7 image 77 and sagittal image 24. The regional soft tissues otherwise appear normal. Disc levels: Disc space loss and frequent facet degeneration but no cervical spinal stenosis. Upper chest: Visible upper thoracic levels appear intact. Negative lung apices. Tortuous proximal great vessels. IMPRESSION: 1. No acute traumatic injury identified in the head or cervical spine. 2. Trace soft tissue gas along the upper thoracic spinous processes is nonspecific. Query regional soft tissue laceration or penetrating trauma. 3. Stable and negative for age noncontrast CT appearance of the brain. Electronically Signed   By: Angel [name] M.D.   On: 06/28/2018 13:38    Procedures Procedures (including critical care time)  Medications Ordered in ED Medications  morphine 4 MG/ML injection 4 mg (4 mg Intravenous Given 06/28/18 1350)  morphine 4 MG/ML injection 4 mg (4 mg Intravenous Given 06/28/18 1447)     Initial Impression / Assessment and Plan / ED Course  I have reviewed the triage vital signs and the nursing notes.  Pertinent labs & imaging results that were available during my care of the patient were reviewed by me and considered in my medical decision making (see chart for details).    Patient presented to the emergency room for evaluation of a fall at the cancer center.  Patient does have some chronic neck and back pain issues.  She just had a spinal  injection the other day.  X-rays do not show any evidence of acute injuries.  She had laboratory tests at the cancer center this morning.  I did review them.  No acute clinically significant abnormalities noted.  THere was some trace soft tissue gas noted on the plain films.  There is no evidence of penetrating trauma on exam and I suspect this is related to her recent spinal injection.  Patient was treated with pain medications.  She appears stable for discharge.  Final Clinical Impressions(s) / ED Diagnoses   Final diagnoses:  Fall, initial encounter    ED Discharge Orders    None       Dorie Rank, Angel [name] 06/28/18 [phone]

## 2018-06-30 ENCOUNTER — Telehealth: Payer: Self-pay | Admitting: *Deleted

## 2018-06-30 MED ORDER — CIPROFLOXACIN HCL 250 MG PO TABS: 250.0000 mg | ORAL_TABLET | Freq: Two times a day (BID) | ORAL | 0 refills | Status: DC

## 2018-06-30 NOTE — Telephone Encounter (Signed)
This RN spoke with the patient's daughter per her email wanting to follow up on the plan post appointment on 10/2 and changes in the patient's schedule.  This RN discussed episode of pt's fall and MD concern per noted changes in pt's status with plan to hold the injections at this time and reassess in 6 months. As far as the question " is my mother cancer free " this RN informed Freda Munro there is no obvious cancer.   Of note this RN informed Freda Munro as well of mildly elevated WBC and concern that pt could be harboring an infection such as a UTI. MD recommendation is for antibiotic therapy x 3 days and request for daughter to call with updated status.  Freda Munro denies that pt is having any urinary issues or fevers.  This RN discussed with Freda Munro concern that her mother may be naturally declining due to age with Freda Munro in agreement to noted concerns and changes and verified that pt is under the care of a primary MD.  No further needs at this time- pharmacy verified and prescription sent per MD request.

## 2018-07-03 ENCOUNTER — Ambulatory Visit: Payer: Medicare Other | Admitting: Cardiology

## 2018-07-05 DIAGNOSIS — R2689 Other abnormalities of gait and mobility: Secondary | ICD-10-CM | POA: Diagnosis not present

## 2018-07-14 ENCOUNTER — Other Ambulatory Visit: Payer: Self-pay | Admitting: Family Medicine

## 2018-07-17 NOTE — Telephone Encounter (Signed)
Patient daughter is calling to follow up on this request.  [place], [place], [address]  [address] [place] [address]  Phone: [phone] Fax: [phone]

## 2018-07-17 NOTE — Telephone Encounter (Signed)
Refill request for diazepam.   Last OV: 05/12/2018 Last Fill: 06/01/2018 #60 and 0RF UDS: 04/29/2018- at hospital

## 2018-07-18 DIAGNOSIS — R413 Other amnesia: Secondary | ICD-10-CM | POA: Diagnosis not present

## 2018-07-18 DIAGNOSIS — G301 Alzheimer's disease with late onset: Secondary | ICD-10-CM | POA: Diagnosis not present

## 2018-07-20 DIAGNOSIS — M1711 Unilateral primary osteoarthritis, right knee: Secondary | ICD-10-CM | POA: Diagnosis not present

## 2018-07-20 DIAGNOSIS — M25561 Pain in right knee: Secondary | ICD-10-CM | POA: Diagnosis not present

## 2018-07-24 ENCOUNTER — Telehealth: Payer: Self-pay | Admitting: Oncology

## 2018-07-24 NOTE — Telephone Encounter (Signed)
Spoke with patients daughter - aware of appt date and time per 10/28 sch message. Reminder letter sent in the mail with appt date and time.

## 2018-07-24 NOTE — Telephone Encounter (Signed)
Per Val cancelled appointments and patient called in

## 2018-07-26 ENCOUNTER — Ambulatory Visit: Payer: Medicare Other

## 2018-07-26 ENCOUNTER — Other Ambulatory Visit: Payer: Medicare Other

## 2018-08-03 ENCOUNTER — Other Ambulatory Visit: Payer: Self-pay | Admitting: Family Medicine

## 2018-08-03 DIAGNOSIS — F0391 Unspecified dementia with behavioral disturbance: Secondary | ICD-10-CM

## 2018-08-09 DIAGNOSIS — Z5181 Encounter for therapeutic drug level monitoring: Secondary | ICD-10-CM | POA: Diagnosis not present

## 2018-08-09 DIAGNOSIS — Z79899 Other long term (current) drug therapy: Secondary | ICD-10-CM | POA: Diagnosis not present

## 2018-08-11 ENCOUNTER — Other Ambulatory Visit: Payer: Self-pay | Admitting: Family Medicine

## 2018-08-11 DIAGNOSIS — F0391 Unspecified dementia with behavioral disturbance: Secondary | ICD-10-CM

## 2018-08-16 ENCOUNTER — Ambulatory Visit (INDEPENDENT_AMBULATORY_CARE_PROVIDER_SITE_OTHER): Payer: Medicare Other | Admitting: Cardiology

## 2018-08-16 ENCOUNTER — Encounter: Payer: Self-pay | Admitting: Cardiology

## 2018-08-16 ENCOUNTER — Other Ambulatory Visit: Payer: Self-pay | Admitting: Family Medicine

## 2018-08-16 VITALS — BP 142/92 | HR 64 | Ht 62.0 in | Wt 151.0 lb

## 2018-08-16 DIAGNOSIS — I2510 Atherosclerotic heart disease of native coronary artery without angina pectoris: Secondary | ICD-10-CM | POA: Diagnosis not present

## 2018-08-16 DIAGNOSIS — E785 Hyperlipidemia, unspecified: Secondary | ICD-10-CM

## 2018-08-16 DIAGNOSIS — I10 Essential (primary) hypertension: Secondary | ICD-10-CM

## 2018-08-16 DIAGNOSIS — I480 Paroxysmal atrial fibrillation: Secondary | ICD-10-CM

## 2018-08-16 DIAGNOSIS — R079 Chest pain, unspecified: Secondary | ICD-10-CM | POA: Diagnosis not present

## 2018-08-16 LAB — ALT: ALT: 11 IU/L (ref 0–32)

## 2018-08-16 LAB — LIPID PANEL
CHOL/HDL RATIO: 2.5 ratio (ref 0.0–4.4)
CHOLESTEROL TOTAL: 232 mg/dL — AB (ref 100–199)
HDL: 94 mg/dL (ref >39)
LDL Calculated: 117 mg/dL — ABNORMAL HIGH (ref 0–99)
Triglycerides: 105 mg/dL (ref 0–149)
VLDL Cholesterol Cal: 21 mg/dL (ref 5–40)

## 2018-08-16 MED ORDER — ASPIRIN EC 81 MG PO TBEC: 81.0000 mg | DELAYED_RELEASE_TABLET | Freq: Every day | ORAL | 3 refills | Status: AC

## 2018-08-16 NOTE — Addendum Note (Signed)
Addended by: Sarina Ill on: 08/16/2018 12:32 PM   Modules accepted: Orders

## 2018-08-16 NOTE — Progress Notes (Signed)
Cardiology Office Note:    Date:  08/16/2018   ID:  Angel [name], DOB [birth year], MRN [id number]  PCP:  Carollee Herter, Alferd Apa, DO  Cardiologist:  Fransico Him, MD    Referring MD: Carollee Herter, Alferd Apa, *   Chief Complaint  Patient presents with  . Coronary Artery Disease  . Hypertension  . Hyperlipidemia  . Atrial Fibrillation    History of Present Illness:    Angel [name] is a 84 y.o. female with a hx of non-obstructive CAD with 50-70 % stenosis of the mid LAD by cath February 2016, mild to moderate MR, DDD, HL, metastatic breast CA, remote asthma, GERD. She has been tried on several antianginal meds including Ranolazine which cause severe fatigue and she also had significant side effects to metoprolol with fatigue and lethargy.  She was hospitalized last April with new onset A. fib with RVR.  Echo at that time showed EF 60 to 65% with mild to moderate MR.  She had a markedly significant drop in hemoglobin from 9->5.1 and positive Hemoccult and was felt not to be a candidate for long-term and coagulation.  She was last seen by my PA on 03/17/2018 and was actually doing well.  She is on amiodarone and was maintaining sinus rhythm at that time.  She is here today for followup and is doing well.  She denies any  SOB, DOE, PND, orthopnea, LE edema or syncope.  Her son says that she is having a lot more memory issues.  She tells me today that she has been having intermittent chest tightness at times but is random.  There is no radiation to her arms or neck.  There is no associated diaphoresis, nausea or shortness of breath.  She also says she has been having intermittent palpitations at time as well.  Occasionally she will have a dizzy spell if she stands up too quickly.  She is compliant with her meds and is tolerating meds with no SE.    Past Medical History:  Diagnosis Date  . Anxiety   . Asthma   . Breast cancer ([place]) 05/07/1999, 06/2011   a. s/p bilat mastectomies.  recurrence 01/2014. Radiation, meds.  . Cancer of chest (wall) ([place])   . Cataract    eye surgery planned for 11/2014  . Chest wall recurrence of breast cancer ([place]) 2016  . Cholelithiasis   . Chronic bronchitis   . COPD (chronic obstructive pulmonary disease) ([place])   . Coronary artery disease Non-obstructive   a. 2012 Cath: LAD 40-20m->Med Rx;  b. 12/2013 Lexi MV: EF 74%, no ischemia. c. Cath 10/2014 - 50-70% mLAD but visually felt unchanged from prior, med rx recommended as sx were atypical.  . DDD (degenerative disc disease)   . Depression   . Dyslipidemia   . GERD (gastroesophageal reflux disease)   . Hx of radiation therapy 07/02/1999- 08/11/1999   right supraclavucular/axillary region: 5040 cGy, 28 fractions  . Hx of radiation therapy 02/20/14- 03/27/14   right chest wall 4500 cGy 25 sessions  . Hypertension   . Kidney stones    one stones  . Mitral regurgitation    a. mild-mod by echo 12/2017.  . Osteoarthritis   . Osteoporosis   . PAF (paroxysmal atrial fibrillation) ([place]) 12/2017   Not a candidate for anticoagulation due to history of GI bleed earlier in 2019 with severe anemia  . Pre-diabetes   . PVC's (premature ventricular contractions)   . Reflux esophagitis   .  Skin cancer of face    non melanoma  . Spinal stenosis     Past Surgical History:  Procedure Laterality Date  . ABDOMINAL HYSTERECTOMY     in her 30's  . APPENDECTOMY    . BREAST BIOPSY Right 01/21/2014   Procedure: BREAST/CHEST WALL BIOPSY;  Surgeon: [name], MD;  Location: [facility];  Service: General;  Laterality: Right;  . BREAST LUMPECTOMY  2002   right breast  . CARDIAC CATHETERIZATION  03/2011, 3/15  . COLONOSCOPY WITH PROPOFOL N/A 01/27/2018   Procedure: COLONOSCOPY WITH PROPOFOL;  Surgeon: Wonda Horner, MD;  Location: [facility] ENDOSCOPY;  Service: Endoscopy;  Laterality: N/A;  and EGD  . ESOPHAGOGASTRODUODENOSCOPY (EGD) WITH PROPOFOL N/A 01/27/2018   Procedure: ESOPHAGOGASTRODUODENOSCOPY (EGD) WITH  PROPOFOL;  Surgeon: Wonda Horner, MD;  Location: [facility] ENDOSCOPY;  Service: Endoscopy;  Laterality: N/A;  . LEFT HEART CATHETERIZATION WITH CORONARY ANGIOGRAM N/A 12/05/2013   Procedure: LEFT HEART CATHETERIZATION WITH CORONARY ANGIOGRAM;  Surgeon: [name], MD;  Location: [facility] CATH LAB;  Service: Cardiovascular;  Laterality: N/A;  . LEFT HEART CATHETERIZATION WITH CORONARY ANGIOGRAM N/A 11/06/2014   Procedure: LEFT HEART CATHETERIZATION WITH CORONARY ANGIOGRAM;  Surgeon: [name], MD;  Location: [facility] CATH LAB;  Service: Cardiovascular;  Laterality: N/A;  . MASTECTOMY  07/29/11   bilateral-rt nodes-none on left  . SMALL INTESTINE SURGERY  1998   SBO  . TONSILLECTOMY    . TUBAL LIGATION  1961  . VEIN LIGATION AND STRIPPING Right   . VESICOVAGINAL FISTULA CLOSURE W/ TAH  age 61    Current Medications: No outpatient medications have been marked as taking for the 08/16/18 encounter (Office Visit) with [name], MD.     Allergies:   Adhesive [tape]; Gabapentin; and Penicillins   Social History   Socioeconomic History  . Marital status: Single    Spouse name: Not on file  . Number of children: 4  . Years of education: Not on file  . Highest education level: Not on file  Occupational History  . Occupation: Retired    Fish farm manager: OTHER    Comment: Nursing  Social Needs  . Financial resource strain: Not on file  . Food insecurity:    Worry: Not on file    Inability: Not on file  . Transportation needs:    Medical: Not on file    Non-medical: Not on file  Tobacco Use  . Smoking status: Never Smoker  . Smokeless tobacco: Never Used  Substance and Sexual Activity  . Alcohol use: Yes    Comment: occ wine  . Drug use: No  . Sexual activity: Not on file  Lifestyle  . Physical activity:    Days per week: Not on file    Minutes per session: Not on file  . Stress: Not on file  Relationships  . Social connections:    Talks on phone: Not on file    Gets  together: Not on file    Attends religious service: Not on file    Active member of club or organization: Not on file    Attends meetings of clubs or organizations: Not on file    Relationship status: Not on file  Other Topics Concern  . Not on file  Social History Narrative  . Not on file     Family History: The patient's family history includes Asthma in her brother, brother, daughter, and grandchild; Cancer in her mother; Emphysema in her father; Heart disease in  her brother and father; Hypertension in her daughter; Lymphoma in her brother; Pancreatic cancer in her mother; Stroke in her daughter.  ROS:   Please see the history of present illness.    ROS  All other systems reviewed and negative.   EKGs/Labs/Other Studies Reviewed:    The following studies were reviewed today: Hospital notes from 12/2017 and echo  EKG:  EKG is not ordered today.    Recent Labs: 01/20/2018: B Natriuretic Peptide 455.0; TSH 1.196 01/30/2018: Magnesium 2.1 03/27/2018: NT-Pro BNP 355 06/28/2018: ALT 13; BUN 14; Creatinine, Ser 0.91; Hemoglobin 10.9; Platelets 222; Potassium 4.2; Sodium 138   Recent Lipid Panel    Component Value Date/Time   CHOL 165 01/21/2018 0730   TRIG 70 01/21/2018 0730   HDL 50 01/21/2018 0730   CHOLHDL 3.3 01/21/2018 0730   VLDL 14 01/21/2018 0730   LDLCALC 101 (H) 01/21/2018 0730    Physical Exam:    VS:  There were no vitals taken for this visit.    Wt Readings from Last 3 Encounters:  06/28/18 123 lb (55.8 kg)  05/12/18 132 lb (59.9 kg)  04/28/18 113 lb (51.3 kg)     GEN:  Well nourished, well developed in no acute distress HEENT: Normal NECK: No JVD; No carotid bruits LYMPHATICS: No lymphadenopathy CARDIAC: RRR, no murmurs, rubs, gallops RESPIRATORY:  Clear to auscultation without rales, wheezing or rhonchi  ABDOMEN: Soft, non-tender, non-distended MUSCULOSKELETAL:  No edema; No deformity  SKIN: Warm and dry NEUROLOGIC:  Alert and oriented x  3 PSYCHIATRIC:  Normal affect   ASSESSMENT:    1. Coronary artery disease involving native coronary artery of native heart without angina pectoris   2. PAF (paroxysmal atrial fibrillation) ([place])   3. Essential hypertension   4. Dyslipidemia    PLAN:    In order of problems listed above:  1.  ASCAD - non-obstructive CAD with 50-70 % stenosis of the mid LAD by cath February 2016.  She has been having some exertional chest pressure at times with no associated symptoms.  Her last nuclear stress test was several years ago.  Her last cath was in 2016.  I will get a Lexiscan Myoview to rule out ischemia.  She is currently on aspirin 165 mg daily which I have instructed her to decrease to 81 mg daily.  We will continue statin therapy.  2.  PAF - she is maintaining normal sinus rhythm on exam today.  On amiodarone 200 mg daily.  She was felt not to be a candidate for long-term anticoagulation due to recent history of GI bleeding.   I will repeat a TSH today.  Her ALT was normal at 13 on 06/28/2018.  We will check PFTs with [facility].  She will follow-up with my extender in 3 months to make sure she is doing okay and repeat a TSH and ALT which she needs every 3 months for the first year on amiodarone.  I am going to get a 30-day event monitor to see if she is still having paroxysmal atrial fibrillation since she is complaining of palpitations.  3.  HTN - she is controlled on exam today.  She not on any antihypertensive medication.    4.  Hyperlipidemia -her LDL goal is less than 70.  Her LDL was 101 on 01/21/2018.  I will repeat a FLP and ALT.  She will continue atorvastatin 20 mg daily.   Medication Adjustments/Labs and Tests Ordered: Current medicines are reviewed at length with the patient  today.  Concerns regarding medicines are outlined above.  No orders of the defined types were placed in this encounter.  No orders of the defined types were placed in this encounter.   Signed, Fransico Him, MD   08/16/2018 10:02 AM    [place]

## 2018-08-16 NOTE — Patient Instructions (Signed)
Medication Instructions:  Decrease Aspirin to 81 mg, daily  If you need a refill on your cardiac medications before your next appointment, please call your pharmacy.   Lab work: Today: TSH  Future fasting labs at [facility]: ALT and Lipids  If you have labs (blood work) drawn today and your tests are completely normal, you will receive your results only by: [name] MyChart Message (if you have MyChart) OR . A paper copy in the mail If you have any lab test that is abnormal or we need to change your treatment, we will call you to review the results.  Testing/Procedures: Your physician has recommended that you have a pulmonary function test. Pulmonary Function Tests are a group of tests that measure how well air moves in and out of your lungs.  Your physician has recommended that you wear an event monitor. Event monitors are medical devices that record the heart's electrical activity. Doctors most often [place] these monitors to diagnose arrhythmias. Arrhythmias are problems with the speed or rhythm of the heartbeat. The monitor is a small, portable device. You can wear one while you do your normal daily activities. This is usually used to diagnose what is causing palpitations/syncope (passing out).  Your physician has requested that you have a lexiscan myoview. For further information please visit [email or web address][email or web address]. Please follow instruction sheet, as given.  Follow-Up: At [facility], you and your health needs are our priority.  As part of our continuing mission to provide you with exceptional heart care, we have created designated Provider Care Teams.  These Care Teams include your primary Cardiologist (physician) and Advanced Practice Providers (APPs -  Physician Assistants and Nurse Practitioners) who all work together to provide you with the care you need, when you need it.  Your physician recommends that you schedule a follow-up appointment in: 3 months with PA.   You will need a follow up  appointment in 1 years.  Please call our office 2 months in advance to schedule this appointment.  You may see Fransico Him, MD  or one of the following Advanced Practice Providers on your designated Care Team:   [place], PA-C Melina Copa, PA-C . Ermalinda Barrios, PA-C  [facility] Address:  [address] [place], [place] [address]

## 2018-08-17 ENCOUNTER — Telehealth: Payer: Self-pay

## 2018-08-17 DIAGNOSIS — E785 Hyperlipidemia, unspecified: Secondary | ICD-10-CM

## 2018-08-17 MED ORDER — ATORVASTATIN CALCIUM 40 MG PO TABS: 40.0000 mg | ORAL_TABLET | Freq: Every day | ORAL | 3 refills | Status: AC

## 2018-08-17 NOTE — Telephone Encounter (Signed)
-----   Message from [name], MD sent at 08/16/2018  5:22 PM EST ----- LDL not at goal increase Lipitor to 40mg  daily and repeat FLP and ALT in 6 weeks

## 2018-08-17 NOTE — Telephone Encounter (Signed)
Spoke with the patient's daughter per dpr, she accepted increasing Lipitor to 40 mg and repeat fasting labs on 10/02/18.

## 2018-08-18 NOTE — Telephone Encounter (Signed)
Requesting:valium  Contract:no UDS:no Last OV:05/12/18 Next OV:=not scheduled  Last Refill:07/17/18  #[id number] Database:   Please advise

## 2018-08-23 ENCOUNTER — Other Ambulatory Visit: Payer: Medicare Other

## 2018-08-23 ENCOUNTER — Ambulatory Visit: Payer: Medicare Other

## 2018-08-28 DIAGNOSIS — M25561 Pain in right knee: Secondary | ICD-10-CM | POA: Diagnosis not present

## 2018-08-29 ENCOUNTER — Telehealth (HOSPITAL_COMMUNITY): Payer: Self-pay

## 2018-08-29 NOTE — Telephone Encounter (Signed)
Detailed message given to the pt's daughter. She stated that she would have her there. S.Dijon Kohlman EMTP

## 2018-08-31 ENCOUNTER — Ambulatory Visit (HOSPITAL_COMMUNITY): Payer: Medicare Other | Attending: Cardiology

## 2018-08-31 ENCOUNTER — Ambulatory Visit (INDEPENDENT_AMBULATORY_CARE_PROVIDER_SITE_OTHER): Payer: Medicare Other

## 2018-08-31 DIAGNOSIS — I480 Paroxysmal atrial fibrillation: Secondary | ICD-10-CM

## 2018-08-31 DIAGNOSIS — R079 Chest pain, unspecified: Secondary | ICD-10-CM | POA: Diagnosis not present

## 2018-08-31 DIAGNOSIS — I5033 Acute on chronic diastolic (congestive) heart failure: Secondary | ICD-10-CM

## 2018-08-31 LAB — MYOCARDIAL PERFUSION IMAGING
CHL CUP NUCLEAR SDS: 4
CHL CUP NUCLEAR SRS: 2
CSEPPHR: 72 {beats}/min
LV dias vol: 60 mL (ref 46–106)
LV sys vol: 29 mL
Rest HR: 58 {beats}/min
SSS: 7
TID: 0.91

## 2018-08-31 MED ORDER — TECHNETIUM TC 99M TETROFOSMIN IV KIT
32.1000 | PACK | Freq: Once | INTRAVENOUS | Status: AC | PRN
  Administered 2018-08-31: 32.1 via INTRAVENOUS
  Filled 2018-08-31: qty 33

## 2018-08-31 MED ORDER — REGADENOSON 0.4 MG/5ML IV SOLN
0.4000 mg | Freq: Once | INTRAVENOUS | Status: AC
  Administered 2018-08-31: 0.4 mg via INTRAVENOUS

## 2018-08-31 MED ORDER — TECHNETIUM TC 99M TETROFOSMIN IV KIT
10.2000 | PACK | Freq: Once | INTRAVENOUS | Status: AC | PRN
  Administered 2018-08-31: 10.2 via INTRAVENOUS
  Filled 2018-08-31: qty 11

## 2018-09-04 ENCOUNTER — Encounter

## 2018-09-04 ENCOUNTER — Encounter: Payer: Medicare Other | Admitting: Psychology

## 2018-09-04 DIAGNOSIS — M1711 Unilateral primary osteoarthritis, right knee: Secondary | ICD-10-CM | POA: Diagnosis not present

## 2018-09-08 ENCOUNTER — Ambulatory Visit (INDEPENDENT_AMBULATORY_CARE_PROVIDER_SITE_OTHER): Payer: Medicare Other | Admitting: Internal Medicine

## 2018-09-08 ENCOUNTER — Telehealth: Payer: Self-pay | Admitting: *Deleted

## 2018-09-08 DIAGNOSIS — I480 Paroxysmal atrial fibrillation: Secondary | ICD-10-CM

## 2018-09-08 LAB — PULMONARY FUNCTION TEST
FEF 25-75 Post: 1.61 L/sec
FEF 25-75 Pre: 1.15 L/sec
FEF2575-%Change-Post: 39 %
FEF2575-%Pred-Post: 147 %
FEF2575-%Pred-Pre: 105 %
FEV1-%Change-Post: 6 %
FEV1-%PRED-PRE: 81 %
FEV1-%Pred-Post: 86 %
FEV1-POST: 1.39 L
FEV1-PRE: 1.31 L
FEV1FVC-%Change-Post: 0 %
FEV1FVC-%PRED-PRE: 107 %
FEV6-%Change-Post: 6 %
FEV6-%PRED-POST: 86 %
FEV6-%PRED-PRE: 81 %
FEV6-POST: 1.78 L
FEV6-Pre: 1.67 L
FEV6FVC-%CHANGE-POST: 0 %
FEV6FVC-%PRED-POST: 106 %
FEV6FVC-%Pred-Pre: 106 %
FVC-%Change-Post: 7 %
FVC-%Pred-Post: 81 %
FVC-%Pred-Pre: 75 %
FVC-PRE: 1.67 L
FVC-Post: 1.79 L
POST FEV6/FVC RATIO: 100 %
Post FEV1/FVC ratio: 78 %
Pre FEV1/FVC ratio: 79 %
Pre FEV6/FVC Ratio: 100 %

## 2018-09-08 NOTE — Progress Notes (Signed)
PFT completed today.  

## 2018-09-08 NOTE — Telephone Encounter (Signed)
Called pt and spoke with daughter Freda Munro. Per Freda Munro, she was returning a call from [place].  Freda Munro stated they will try to make it to office by 3:30 for the PFT. Freda Munro does have our new office address.  Routing to [place] as an Pharmacist, hospital.

## 2018-09-11 DIAGNOSIS — M1711 Unilateral primary osteoarthritis, right knee: Secondary | ICD-10-CM | POA: Diagnosis not present

## 2018-09-19 ENCOUNTER — Other Ambulatory Visit: Payer: Self-pay | Admitting: Family Medicine

## 2018-09-22 NOTE — Telephone Encounter (Signed)
Database ran and is on your desk for review.  Last filled per database:  08/18/18 Last written: 08/18/18 Last ov: 05/12/18 Next ov: none Contract: none UDS: none

## 2018-09-22 NOTE — Telephone Encounter (Signed)
Approved on behalf of reg PCP.

## 2018-10-10 ENCOUNTER — Telehealth: Payer: Self-pay

## 2018-10-10 MED ORDER — METOPROLOL SUCCINATE ER 25 MG PO TB24: 25.0000 mg | ORAL_TABLET | Freq: Every day | ORAL | 3 refills | Status: AC

## 2018-10-10 NOTE — Telephone Encounter (Signed)
-----   Message from [name], MD sent at 10/03/2018  7:14 PM EST ----- Please let patient know that heart monitor showed occasional extra heart beats from the bottom of the heart.  Please start Toprol XL 25mg  daily and followup with extender in 1-2 weeks

## 2018-10-10 NOTE — Telephone Encounter (Signed)
The patient has been notified of the result and verbalized understanding.  All questions (if any) were answered. Scheduled the patient with 1/30 for 2 week follow up and medication sent into the pharmacy. Sarina Ill, RN 10/10/2018 2:37 PM

## 2018-10-17 ENCOUNTER — Encounter: Payer: Medicare Other | Admitting: Psychology

## 2018-10-20 ENCOUNTER — Other Ambulatory Visit: Payer: Self-pay | Admitting: Family Medicine

## 2018-10-23 DIAGNOSIS — M25561 Pain in right knee: Secondary | ICD-10-CM | POA: Diagnosis not present

## 2018-10-26 ENCOUNTER — Ambulatory Visit: Payer: Medicare Other | Admitting: Physician Assistant

## 2018-10-26 ENCOUNTER — Encounter: Payer: Self-pay | Admitting: Physician Assistant

## 2018-10-26 VITALS — BP 124/88 | HR 58 | Ht 62.0 in | Wt 153.2 lb

## 2018-10-26 DIAGNOSIS — I480 Paroxysmal atrial fibrillation: Secondary | ICD-10-CM

## 2018-10-26 DIAGNOSIS — I2510 Atherosclerotic heart disease of native coronary artery without angina pectoris: Secondary | ICD-10-CM

## 2018-10-26 DIAGNOSIS — I10 Essential (primary) hypertension: Secondary | ICD-10-CM | POA: Diagnosis not present

## 2018-10-26 NOTE — Progress Notes (Signed)
Cardiology Office Note    Date:  10/26/2018   ID:  Angel [name], DOB 1931-11-06, MRN [id number]  PCP:  Carollee Herter, Angel Apa, DO  Cardiologist:Dr. Radford Pax  Chief Complaint: 2 weeks follow up  History of Present Illness:   Angel [name] is a 85 y.o. female with a hx of non-obstructive CAD with 50-70 % stenosis of the mid LAD by cath February 2016, mild to moderate MR, DDD, HL, metastatic breast CA, remote asthma, GERD and PAF presents for follow up.   She has been tried on several antianginal meds including Ranolazine which cause severe fatigue and she also had significant side effects to metoprolol with fatigue and lethargy.  She was hospitalized last April with new onset A. fib with RVR.  Echo at that time showed EF 60 to 65% with mild to moderate MR.  She had a markedly significant drop in hemoglobin from 9->5.1 and positive Hemoccult and was felt not to be a candidate for long-term anti-coagulation.   Last seen by Dr. Radford Pax 07/2018>> complained some exertional chest pressure and occasional palpitations. Follow up stress test 08/2018 was low risk. 30 days monitor showed PVCs (2% load). Added Toprol XL.   Here today for follow up. No further complains of chest pressure or palpitations. Lives with daughter who is primary care giver. Little tired but not like before when on metoprolol. No chest pain, dyspnea, orthopnea, PNd, Le edema or melena.   Past Medical History:  Diagnosis Date  . Anxiety   . Asthma   . Breast cancer ([place]) 05/07/1999, 06/2011   a. s/p bilat mastectomies. recurrence 01/2014. Radiation, meds.  . Cancer of chest (wall) ([place])   . Cataract    eye surgery planned for 11/2014  . Chest wall recurrence of breast cancer ([place]) 2016  . Cholelithiasis   . Chronic bronchitis   . COPD (chronic obstructive pulmonary disease) ([place])   . Coronary artery disease Non-obstructive   a. 2012 Cath: LAD 40-102m->Med Rx;  b. 12/2013 Lexi MV: EF 74%, no ischemia. c. Cath  10/2014 - 50-70% mLAD but visually felt unchanged from prior, med rx recommended as sx were atypical.  . DDD (degenerative disc disease)   . Depression   . Dyslipidemia   . GERD (gastroesophageal reflux disease)   . Hx of radiation therapy 07/02/1999- 08/11/1999   right supraclavucular/axillary region: 5040 cGy, 28 fractions  . Hx of radiation therapy 02/20/14- 03/27/14   right chest wall 4500 cGy 25 sessions  . Hypertension   . Kidney stones    one stones  . Mitral regurgitation    a. mild-mod by echo 12/2017.  . Osteoarthritis   . Osteoporosis   . PAF (paroxysmal atrial fibrillation) ([place]) 12/2017   Not a candidate for anticoagulation due to history of GI bleed earlier in 2019 with severe anemia  . Pre-diabetes   . PVC's (premature ventricular contractions)   . Reflux esophagitis   . Skin cancer of face    non melanoma  . Spinal stenosis     Past Surgical History:  Procedure Laterality Date  . ABDOMINAL HYSTERECTOMY     in her 30's  . APPENDECTOMY    . BREAST BIOPSY Right 01/21/2014   Procedure: BREAST/CHEST WALL BIOPSY;  Surgeon: [name], MD;  Location: [facility];  Service: General;  Laterality: Right;  . BREAST LUMPECTOMY  2002   right breast  . CARDIAC CATHETERIZATION  03/2011, 3/15  . COLONOSCOPY WITH PROPOFOL N/A 01/27/2018   Procedure: COLONOSCOPY  WITH PROPOFOL;  Surgeon: Wonda Horner, MD;  Location: [facility] ENDOSCOPY;  Service: Endoscopy;  Laterality: N/A;  and EGD  . ESOPHAGOGASTRODUODENOSCOPY (EGD) WITH PROPOFOL N/A 01/27/2018   Procedure: ESOPHAGOGASTRODUODENOSCOPY (EGD) WITH PROPOFOL;  Surgeon: Wonda Horner, MD;  Location: [facility] ENDOSCOPY;  Service: Endoscopy;  Laterality: N/A;  . LEFT HEART CATHETERIZATION WITH CORONARY ANGIOGRAM N/A 12/05/2013   Procedure: LEFT HEART CATHETERIZATION WITH CORONARY ANGIOGRAM;  Surgeon: [name], MD;  Location: [facility] CATH LAB;  Service: Cardiovascular;  Laterality: N/A;  . LEFT HEART CATHETERIZATION WITH CORONARY ANGIOGRAM N/A  11/06/2014   Procedure: LEFT HEART CATHETERIZATION WITH CORONARY ANGIOGRAM;  Surgeon: [name], MD;  Location: [facility] CATH LAB;  Service: Cardiovascular;  Laterality: N/A;  . MASTECTOMY  07/29/11   bilateral-rt nodes-none on left  . SMALL INTESTINE SURGERY  1998   SBO  . TONSILLECTOMY    . TUBAL LIGATION  1961  . VEIN LIGATION AND STRIPPING Right   . VESICOVAGINAL FISTULA CLOSURE W/ TAH  age 58    Current Medications: Prior to Admission medications   Medication Sig Start Date End Date Taking? Authorizing Provider  acetaminophen (TYLENOL) 325 MG tablet Take 2 tablets (650 mg total) by mouth every 4 (four) hours as needed for headache or mild pain. 02/08/18   Geradine Girt, DO  albuterol (PROVENTIL HFA;VENTOLIN HFA) 108 (90 Base) MCG/ACT inhaler Inhale 1-2 puffs into the lungs every 4 (four) hours as needed for wheezing or shortness of breath. 03/07/18   Ann Held, DO  amiodarone (PACERONE) 200 MG tablet Take 1 tablet (200 mg total) by mouth daily. 04/03/18   [name], MD  aspirin EC 81 MG tablet Take 1 tablet (81 mg total) by mouth daily. 08/16/18   [name], MD  atorvastatin (LIPITOR) 40 MG tablet Take 1 tablet (40 mg total) by mouth daily. 08/17/18 08/17/19  [name], MD  budesonide-formoterol (SYMBICORT) 160-4.5 MCG/ACT inhaler Inhale 2 puffs into the lungs 2 (two) times daily. 02/08/18   Geradine Girt, DO  Cholecalciferol (VITAMIN D-3) 5000 UNITS TABS Take 5,000 Units by mouth daily.     [provider]  diazepam (VALIUM) 5 MG tablet TAKE 1 TABLET BY MOUTH EVERY 8 HOURS AS NEEDED FOR ANXIETY OR  SEDATION 09/22/18   Nani Ravens, Crosby Oyster, DO  donepezil (ARICEPT) 5 MG tablet TAKE 1 TABLET BY MOUTH AT BEDTIME 08/07/18   Carollee Herter, Kendrick Fries R, DO  DULoxetine (CYMBALTA) 60 MG capsule TAKE 1 CAPSULE BY MOUTH ONCE DAILY FOR  MOOD 10/23/18   Ann Held, DO  ezetimibe (ZETIA) 10 MG tablet Take 1 tablet (10 mg total) by mouth daily. 04/25/15    Ann Held, DO  furosemide (LASIX) 20 MG tablet Take 1 tablet (20 mg total) by mouth daily as needed for edema. 03/17/18 08/16/18  Leanor Kail, PA  HYDROcodone-acetaminophen (NORCO) 10-325 MG tablet Take 1 tablet by mouth every 4 (four) hours as needed for severe pain. Patient taking differently: Take 2 tablets by mouth every 6 (six) hours as needed for severe pain.  02/08/18   Geradine Girt, DO  Hypromellose (ARTIFICIAL TEARS OP) Place 1 drop into both eyes 3 (three) times daily as needed (for dry eyes).     [provider]  metoprolol succinate (TOPROL-XL) 25 MG 24 hr tablet Take 1 tablet (25 mg total) by mouth daily. 10/10/18 10/10/19  [name], MD  montelukast (SINGULAIR) 10 MG tablet Take 1  tablet (10 mg total) by mouth at bedtime. 05/08/18   Ann Held, DO  Multiple Vitamin (MULTIVITAMIN WITH MINERALS) TABS tablet Take 1 tablet by mouth daily.    [provider]  naloxone [facility]) nasal spray 4 mg/0.1 mL Place 1 spray into the nose once.     [provider]  nitroGLYCERIN (NITROSTAT) 0.4 MG SL tablet Place 1 tablet (0.4 mg total) under the tongue as needed. Patient taking differently: Place 0.4 mg under the tongue every 5 (five) minutes as needed for chest pain.  11/23/17   [name], MD  Omega-3 Fatty Acids (FISH OIL) 1000 MG CAPS Take 1,000 mg by mouth daily.    [provider]  ondansetron (ZOFRAN ODT) 4 MG disintegrating tablet Take 1 tablet (4 mg total) by mouth every 8 (eight) hours as needed for nausea or vomiting. 01/11/18   Magrinat, Virgie Dad, MD  oxyCODONE-acetaminophen (PERCOCET/ROXICET) 5-325 MG tablet Take 1 tablet by mouth daily. At 3 pm 04/06/18   [provider]  pantoprazole (PROTONIX) 40 MG tablet Take 1 tablet (40 mg total) by mouth daily. 06/28/18   Roma Schanz R, DO  polyethylene glycol (MIRALAX / GLYCOLAX) packet Take 17 g by mouth daily. 01/30/18   Florencia Reasons, MD  potassium chloride  (K-DUR) 10 MEQ tablet TAKE 1 TABLET BY MOUTH ONLY WHEN YOU TAKE A LASIX 03/17/18   Unique Sillas, PA  QUEtiapine (SEROQUEL) 25 MG tablet Take 0.5 tablets (12.5 mg total) by mouth at bedtime. 05/12/18   Ann Held, DO  QUEtiapine (SEROQUEL) 50 MG tablet Take 50-100 mg by mouth at bedtime as needed for sleep or agitation. 06/21/18   [provider]  senna-docusate (SENOKOT-S) 8.6-50 MG tablet Take 1 tablet by mouth at bedtime. 01/30/18   Florencia Reasons, MD  vitamin B-12 (CYANOCOBALAMIN) 500 MCG tablet Take 1 tablet (500 mcg total) by mouth daily. 01/30/18   Florencia Reasons, MD  VITAMIN E PO Take by mouth.    [provider]    Allergies:   Adhesive [tape]; Gabapentin; and Penicillins   Social History   Socioeconomic History  . Marital status: Single    Spouse name: Not on file  . Number of children: 4  . Years of education: Not on file  . Highest education level: Not on file  Occupational History  . Occupation: Retired    Fish farm manager: OTHER    Comment: Nursing  Social Needs  . Financial resource strain: Not on file  . Food insecurity:    Worry: Not on file    Inability: Not on file  . Transportation needs:    Medical: Not on file    Non-medical: Not on file  Tobacco Use  . Smoking status: Never Smoker  . Smokeless tobacco: Never Used  Substance and Sexual Activity  . Alcohol use: Yes    Comment: occ wine  . Drug use: No  . Sexual activity: Not on file  Lifestyle  . Physical activity:    Days per week: Not on file    Minutes per session: Not on file  . Stress: Not on file  Relationships  . Social connections:    Talks on phone: Not on file    Gets together: Not on file    Attends religious service: Not on file    Active member of club or organization: Not on file    Attends meetings of clubs or organizations: Not on file    Relationship status: Not  on file  Other Topics Concern  . Not on file  Social History Narrative  . Not on file     Family  History:  The patient's family history includes Asthma in her brother, brother, daughter, and grandchild; Cancer in her mother; Emphysema in her father; Heart disease in her brother and father; Hypertension in her daughter; Lymphoma in her brother; Pancreatic cancer in her mother; Stroke in her daughter.   ROS:   Please see the history of present illness.    ROS All other systems reviewed and are negative.   PHYSICAL EXAM:   VS:  BP 124/88   Pulse (!) 58   Ht 5\' 2"  (1.575 m)   Wt 153 lb 3.2 oz (69.5 kg)   SpO2 94%   BMI 28.02 kg/m    GEN: Elderly frail female in no acute distress  HEENT: normal  Neck: no JVD, carotid bruits, or masses Cardiac: RRR; no murmurs, rubs, or gallops,no edema  Respiratory:  clear to auscultation bilaterally, normal work of breathing GI: soft, nontender, nondistended, + BS MS: no deformity or atrophy  Skin: warm and dry, no rash Neuro:  Alert and Oriented x 3, Strength and sensation are intact Psych: euthymic mood, full affect  Wt Readings from Last 3 Encounters:  10/26/18 153 lb 3.2 oz (69.5 kg)  08/31/18 151 lb (68.5 kg)  08/16/18 151 lb (68.5 kg)      Studies/Labs Reviewed:   EKG:  EKG is not ordered today.    Recent Labs: 01/20/2018: B Natriuretic Peptide 455.0; TSH 1.196 01/30/2018: Magnesium 2.1 03/27/2018: NT-Pro BNP 355 06/28/2018: BUN 14; Creatinine, Ser 0.91; Hemoglobin 10.9; Platelets 222; Potassium 4.2; Sodium 138 08/16/2018: ALT 11   Lipid Panel    Component Value Date/Time   CHOL 232 (H) 08/16/2018 1106   TRIG 105 08/16/2018 1106   HDL 94 08/16/2018 1106   CHOLHDL 2.5 08/16/2018 1106   CHOLHDL 3.3 01/21/2018 0730   VLDL 14 01/21/2018 0730   LDLCALC 117 (H) 08/16/2018 1106    Additional studies/ records that were reviewed today include:   Monitor 08/2018  Sinus bradycardia, normal sinus rhythm, sinus tachycardia with average heart rate 64bpm. The heart rate ranged from 50 to 120bpm.  Occasional PVCs and bigeminal PVCs. PVC  load 2%.    Stress test 08/2018   Nuclear stress EF: 51%.  There was no ST segment deviation noted during stress.  The study is normal.  This is a low risk study.  The left ventricular ejection fraction is mildly decreased (45-54%).   Thinning of the mid and basal inferior wall likely diaphragm improves with upright images EF estimated at 51% but looks normal with no RWMA. No ischemia low risk study  Echocardiogram: 12/2017 Study Conclusions  - Left ventricle: The cavity size was normal. Systolic function was   normal. The estimated ejection fraction was in the range of 60%   to 65%. Wall motion was normal; there were no regional wall   motion abnormalities. The study was not technically sufficient to   allow evaluation of LV diastolic dysfunction due to atrial   fibrillation. - Aortic valve: Trileaflet; mildly thickened, mildly calcified   leaflets. Sclerosis without stenosis. - Mitral valve: There was mild to moderate regurgitation. - Left atrium: The atrium was normal in size. - Right ventricle: Systolic function was normal. - Tricuspid valve: There was mild regurgitation. - Pulmonic valve: There was mild regurgitation. - Pulmonary arteries: Systolic pressure was within the normal   range. -  Inferior vena cava: The vessel was normal in size. - Pericardium, extracardiac: There was no pericardial effusion.  Impressions:  - When compared to the prior study from 08/20/2015 LVEF has   improved from 50% to 60-65%.  ASSESSMENT & PLAN:    1. PAF  - Maintaining sinus rhythm by exam. No palpitation. Continue BB and amiodarone. Not on anticoagulation due to prior GI bleed.   2. CAD - Recent stress test low risk. No chest pain. Continue ASA, statin and BB.   3. HTN - BP stable on current medications.   Medication Adjustments/Labs and Tests Ordered: Current medicines are reviewed at length with the patient today.  Concerns regarding medicines are outlined above.   Medication changes, Labs and Tests ordered today are listed in the Patient Instructions below. Patient Instructions  Medication Instructions:  Your physician recommends that you continue on your current medications as directed. Please refer to the Current Medication list given to you today.  If you need a refill on your cardiac medications before your next appointment, please call your pharmacy.   Lab work: None ordered  If you have labs (blood work) drawn today and your tests are completely normal, you will receive your results only by: [name] MyChart Message (if you have MyChart) OR . A paper copy in the mail If you have any lab test that is abnormal or we need to change your treatment, we will call you to review the results.  Testing/Procedures: None ordered  Follow-Up: Your physician recommends that you schedule a follow-up appointment in: 3-4 MONTHS WITH  DR. Radford Pax  Any Other Special Instructions Will Be Listed Below (If Applicable).        Jarrett Soho, [place]  10/26/2018 3:44 PM    [facility] [address], [place], [place]  [address] Phone: [phone]; Fax: [phone]

## 2018-10-26 NOTE — Patient Instructions (Signed)
Medication Instructions:  Your physician recommends that you continue on your current medications as directed. Please refer to the Current Medication list given to you today.  If you need a refill on your cardiac medications before your next appointment, please call your pharmacy.   Lab work: None ordered  If you have labs (blood work) drawn today and your tests are completely normal, you will receive your results only by: [name] MyChart Message (if you have MyChart) OR . A paper copy in the mail If you have any lab test that is abnormal or we need to change your treatment, we will call you to review the results.  Testing/Procedures: None ordered  Follow-Up: Your physician recommends that you schedule a follow-up appointment in: 3-4 MONTHS WITH  DR. Radford Pax  Any Other Special Instructions Will Be Listed Below (If Applicable).

## 2018-10-27 ENCOUNTER — Other Ambulatory Visit: Payer: Self-pay | Admitting: Family Medicine

## 2018-10-27 NOTE — Telephone Encounter (Signed)
Database ran on 10/17/18 and is in media for review  Last written: 09/22/18 Last ov: 05/12/18 Next ov: none Contract: none UDS: none

## 2018-10-31 DIAGNOSIS — M5136 Other intervertebral disc degeneration, lumbar region: Secondary | ICD-10-CM | POA: Diagnosis not present

## 2018-10-31 DIAGNOSIS — Z79899 Other long term (current) drug therapy: Secondary | ICD-10-CM | POA: Diagnosis not present

## 2018-10-31 DIAGNOSIS — M5030 Other cervical disc degeneration, unspecified cervical region: Secondary | ICD-10-CM | POA: Diagnosis not present

## 2018-11-21 DIAGNOSIS — R413 Other amnesia: Secondary | ICD-10-CM | POA: Diagnosis not present

## 2018-11-21 DIAGNOSIS — G301 Alzheimer's disease with late onset: Secondary | ICD-10-CM | POA: Diagnosis not present

## 2018-11-27 ENCOUNTER — Other Ambulatory Visit: Payer: Self-pay | Admitting: Family Medicine

## 2018-11-28 NOTE — Telephone Encounter (Signed)
Requesting: Diazepam Contract: N/A  UDS: N/A Last OV: 03/02/2018 Next OV: N/A Last Refill: 10/28/2018, #60-0 RF Database:   Please advise

## 2018-12-07 DIAGNOSIS — Z79899 Other long term (current) drug therapy: Secondary | ICD-10-CM | POA: Diagnosis not present

## 2018-12-15 ENCOUNTER — Other Ambulatory Visit: Payer: Self-pay | Admitting: Family Medicine

## 2018-12-15 IMAGING — DX DG CHEST 2V
3 series · 3 of 3 positions shown · non-contrast
Comparison: Chest radiograph performed 02/05/2018

CLINICAL DATA: Acute onset of burning sensation at the epigastric
region.

EXAM:
CHEST - 2 VIEW

[chest lat (1 of 2)]
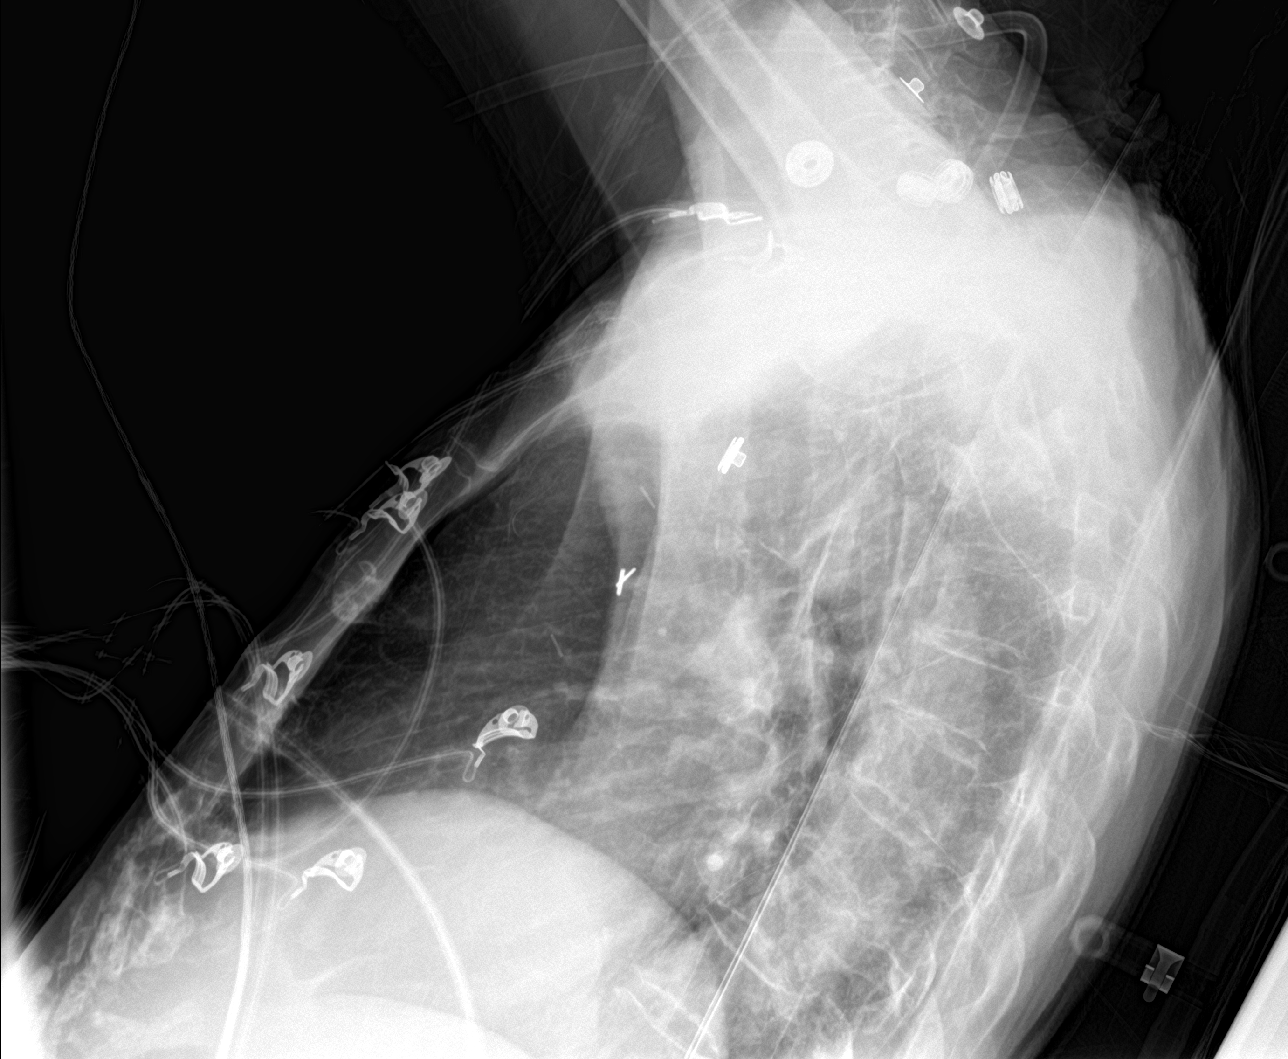

[chest ap]
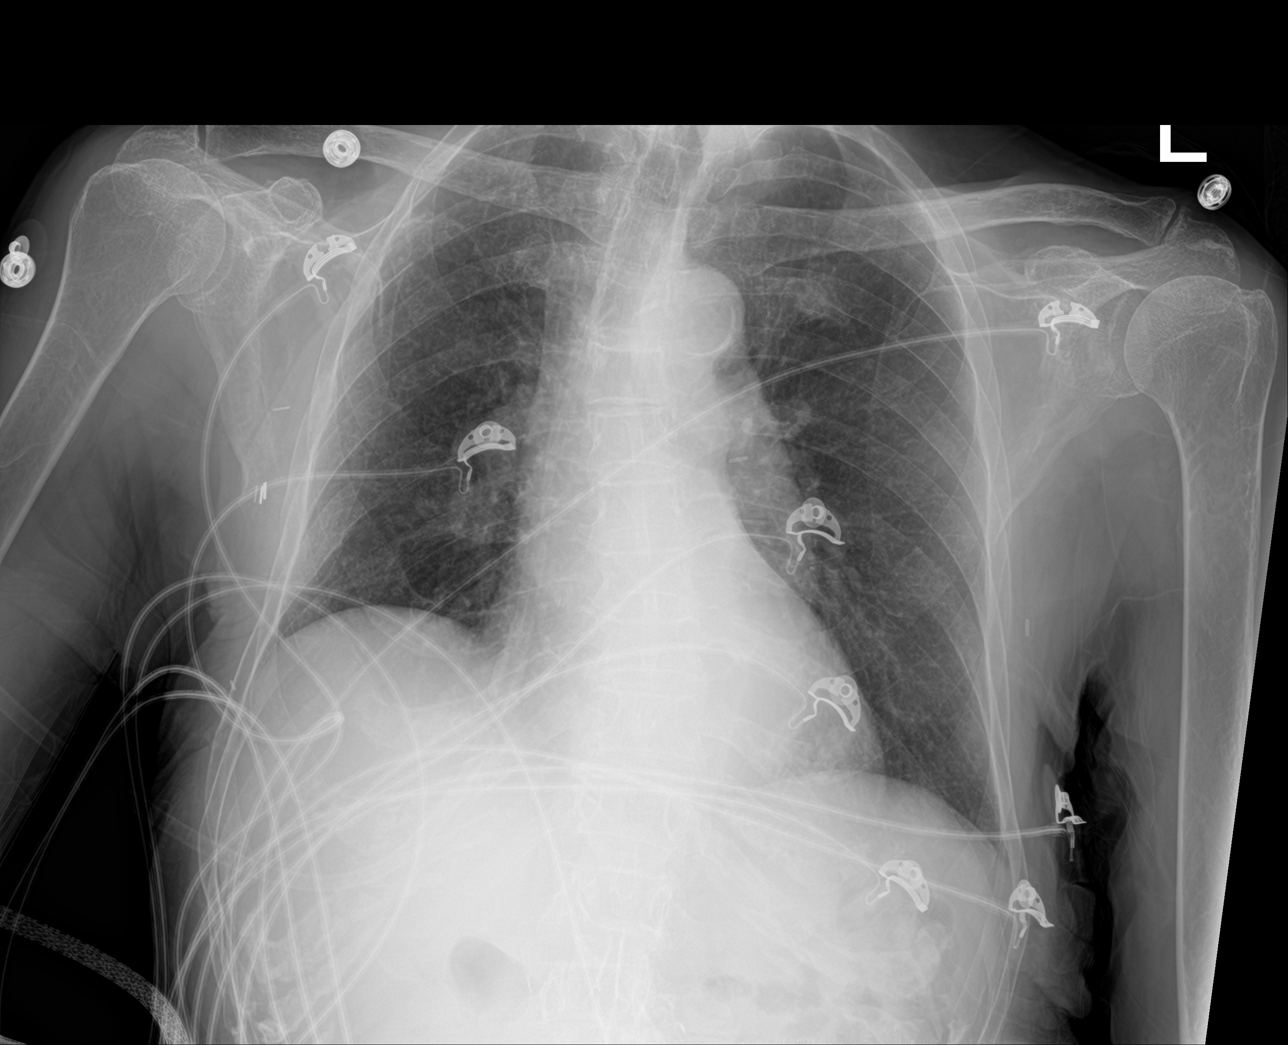

[chest lat (2 of 2)]
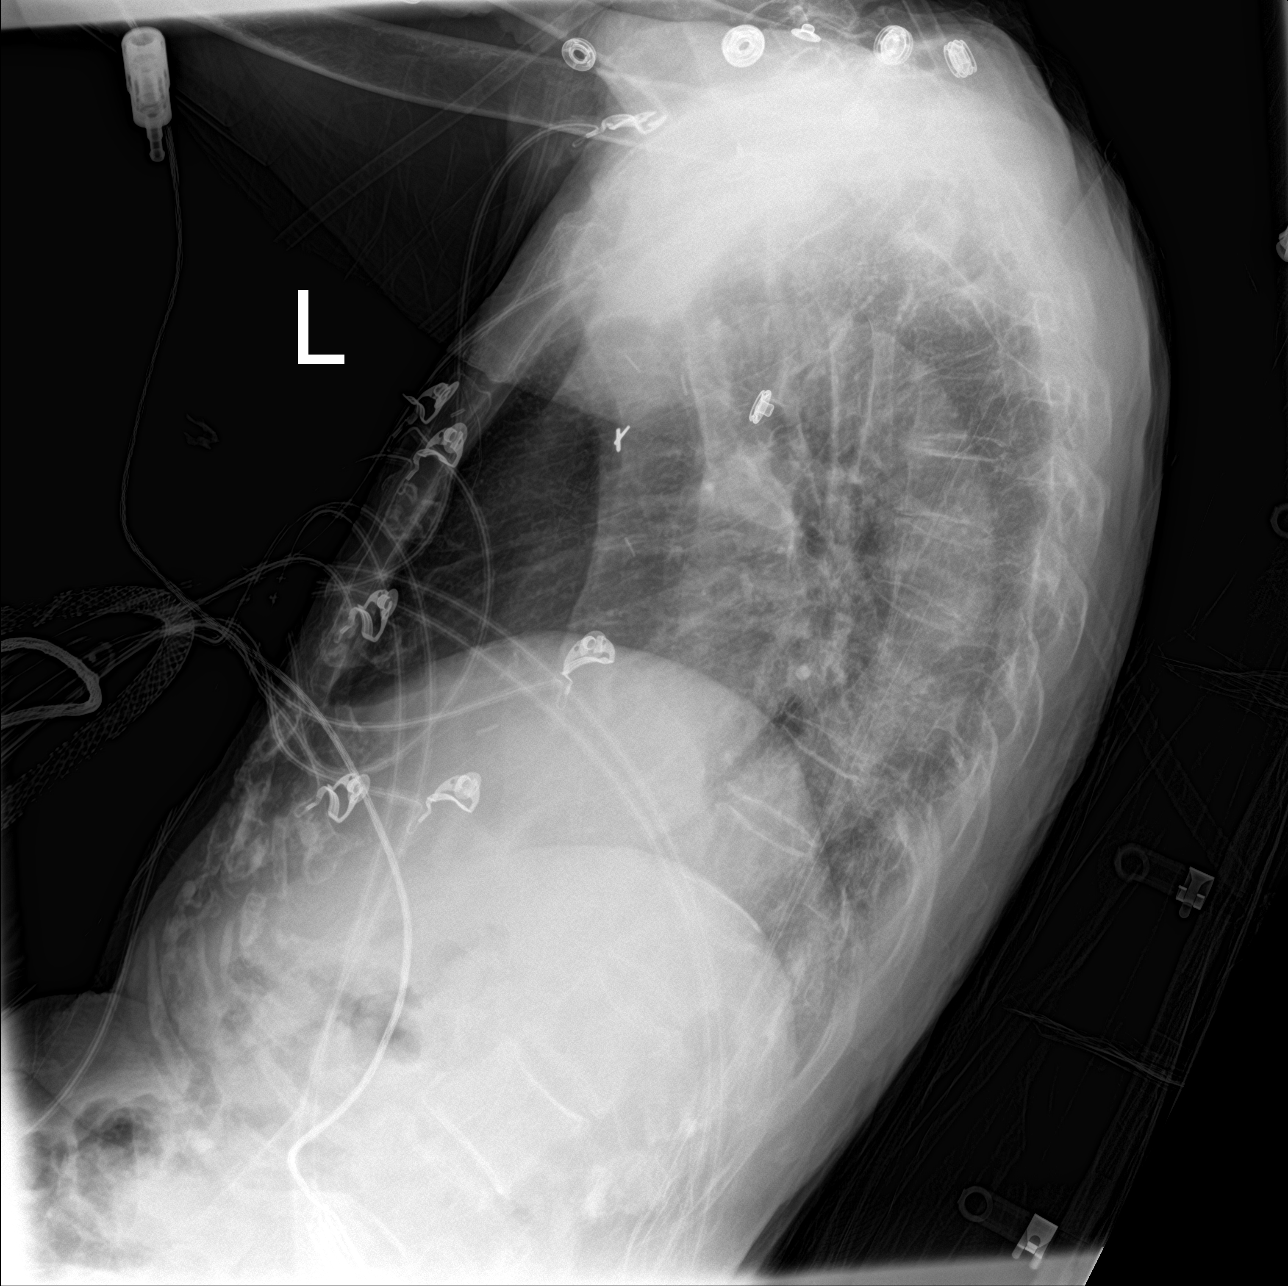

[3 of 3 positions shown; findings below may reference images not displayed]

FINDINGS: The lungs are well-aerated. There is mild elevation of the right
hemidiaphragm. There is no evidence of focal opacification, pleural
effusion or pneumothorax.

The heart is normal in size; the mediastinal contour is within
normal limits. No acute osseous abnormalities are seen. Clips are
noted at the axilla bilaterally.
IMPRESSION: Mild elevation of the right hemidiaphragm. Lungs remain grossly
clear.

## 2018-12-29 ENCOUNTER — Other Ambulatory Visit: Payer: Self-pay | Admitting: Family Medicine

## 2019-01-02 NOTE — Telephone Encounter (Signed)
Last written:  11/28/18 Last ov: 05/12/18 Next ov: none  Contract: none UDS: none

## 2019-01-18 ENCOUNTER — Telehealth: Payer: Self-pay | Admitting: Oncology

## 2019-01-18 NOTE — Telephone Encounter (Signed)
Called patent regarding upcoming Webex appointment, patient's daughter is notified and will set up Webex meeting for patient. Webex invite has been sent.

## 2019-01-22 NOTE — Progress Notes (Signed)
Angel [name]  [phone] [phone]) [phone]   ID: [name] DOB: Nov 10, 1931  MR#: [id number]  VWP#:[id number]  Patient Care Team: Angel [name], Angel Apa, DO as PCP - General (Family Medicine) Angel Margarita, MD as PCP - Cardiology (Cardiology) Angel [name], Angel Dad, MD as Consulting Physician (Oncology) Angel Margarita, MD as Consulting Physician (Cardiology) OTHER M.D.: Angel [name] (ophth)   CHIEF COMPLAINT: Chest wall recurrence of breast cancer  CURRENT TREATMENT: Observation   I connected with Angel [name] on 01/23/19 at  2:00 PM EDT by video enabled telemedicine visit and verified that I am speaking with the correct person using two identifiers.   I discussed the limitations, risks, security and privacy concerns of performing an evaluation and management service by telemedicine and the availability of in-person appointments. I also discussed with the patient that there may be a patient responsible charge related to this service. The patient expressed understanding and agreed to proceed.   Other persons participating in the visit and their role in the encounter:   - Angel [name], medical scribe  - [name]  Patients location: home  Providers location: [place]   Chief Complaint: Chest wall recurrence of breast cancer    BREAST CANCER HISTORY: Per Dr. 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 She had a second right breast cancer in 2012, with right mastectomy by Dr Angel [name] 07-29-2011 for T1N0M0 ER/PR positive, HER-2 negative invasive ductal carcinoma; she also had what was planned as prophylactic left mastectomy, with unexpected finding of left DCIS in that pathology. Plan in 09-2011 was to begin tamoxifen, chosen particularly due to low bone density. Patient did not keep follow  up visits at this office, stopped tamoxifen due intolerance (tho she does not remember what problem she had with this) and was treated with raloxifene by Dr Angel [name].  She was seen next at this office on 12-26-2013 with an enlarging nodular area above right mastectomy scar,reportedly present for several months. Excisional biopsy done 01-21-14 found grade 2 invasive ductal carcinoma with involvement at [facility]) and lymphovascular space invasion, ER positive 100%, PR positive 75%, proliferation marker 80% and HER-2 negative by [facility] (pathology [phone]). PET 02-15-14 did not identify any involvement beyond right chest wall. She had 4500 cGy as electron beam radiation from 5-27 thru 03-27-14 to right chest wall. By 04-30-14 there were multiple tiny nodules in region of right mastectomy scar."  The patient's subsequent history is as detailed below   INTERVAL HISTORY: Angel [name] is seen today for follow-up and treatment of her estrogen receptor positive breast cancer.   She continues under observation.   Since her last visit here, she has not undergone any additional studies.     REVIEW OF SYSTEMS: Angel [name]'s daughter notes that Angel [name] has complained of some pain where her scar is on her left chest and that the area around Angel [name]'s scar is "flabby and soft," but over it it feels a little hard. Angel [name] also notes some pain with her back. Angel [name] has recently been diagnosed with Alzheimers.   The patient denies unusual headaches, visual changes, nausea, vomiting, or dizziness. There has been no unusual cough, phlegm production, or pleurisy. This been no change in bowel or bladder habits. The patient denies unexplained fatigue or unexplained weight loss, bleeding, rash, or fever. A detailed review of systems was otherwise noncontributory.     PAST  MEDICAL HISTORY: Past Medical History:  Diagnosis Date   Anxiety    Asthma    Breast cancer ([place]) 05/07/1999, 06/2011   a. s/p bilat mastectomies. recurrence  01/2014. Radiation, meds.   Cancer of chest (wall) ([place])    Cataract    eye surgery planned for 11/2014   Chest wall recurrence of breast cancer ([place]) 2016   Cholelithiasis    Chronic bronchitis    COPD (chronic obstructive pulmonary disease) ([place])    Coronary artery disease Non-obstructive   a. 2012 Cath: LAD 40-62m>Med Rx;  b. 12/2013 Lexi MV: EF 74%, no ischemia. c. Cath 10/2014 - 50-70% mLAD but visually felt unchanged from prior, med rx recommended as sx were atypical.   DDD (degenerative disc disease)    Depression    Dyslipidemia    GERD (gastroesophageal reflux disease)    Hx of radiation therapy 07/02/1999- 08/11/1999   right supraclavucular/axillary region: 5040 cGy, 28 fractions   Hx of radiation therapy 02/20/14- 03/27/14   right chest wall 4500 cGy 25 sessions   Hypertension    Kidney stones    one stones   Mitral regurgitation    a. mild-mod by echo 12/2017.   Osteoarthritis    Osteoporosis    PAF (paroxysmal atrial fibrillation) ([place] 12/2017   Not a candidate for anticoagulation due to history of GI bleed earlier in 2019 with severe anemia   Pre-diabetes    PVC's (premature ventricular contractions)    Reflux esophagitis    Skin cancer of face    non melanoma   Spinal stenosis     PAST SURGICAL HISTORY: Past Surgical History:  Procedure Laterality Date   ABDOMINAL HYSTERECTOMY     in her 89's  APPENDECTOMY     BREAST BIOPSY Right 01/21/2014   Procedure: BREAST/CHEST WALL BIOPSY;  Surgeon: Angel Ok MD;  [facility]  Service: General;  Laterality: Right;   BREAST LUMPECTOMY  2002   right breast   CARDIAC CATHETERIZATION  03/2011, 3/15   COLONOSCOPY WITH PROPOFOL N/A 01/27/2018   Procedure: COLONOSCOPY WITH PROPOFOL;  Surgeon: GWonda Horner MD;  [facility]  Service: Endoscopy;  Laterality: N/A;  and EGD   ESOPHAGOGASTRODUODENOSCOPY (EGD) WITH PROPOFOL N/A 01/27/2018   Procedure: ESOPHAGOGASTRODUODENOSCOPY  (EGD) WITH PROPOFOL;  Surgeon: GWonda Horner MD;  [facility];  Service: Endoscopy;  Laterality: N/A;   LEFT HEART CATHETERIZATION WITH CORONARY ANGIOGRAM N/A 12/05/2013   Procedure: LEFT HEART CATHETERIZATION WITH CORONARY ANGIOGRAM;  Surgeon: CBurnell Blanks MD;  [facility];  Service: Cardiovascular;  Laterality: N/A;   LEFT HEART CATHETERIZATION WITH CORONARY ANGIOGRAM N/A 11/06/2014   Procedure: LEFT HEART CATHETERIZATION WITH CORONARY ANGIOGRAM;  Surgeon: HSinclair Grooms MD;  [facility];  Service: Cardiovascular;  Laterality: N/A;   MASTECTOMY  07/29/11   bilateral-rt nodes-none on left   SMALL INTESTINE SURGERY  1998   SBO   TONSILLECTOMY     TUBAL LIGATION  1961   VEIN LIGATION AND STRIPPING Right    VESICOVAGINAL FISTULA CLOSURE W/ TAH  age 85   FAMILY HISTORY Family History  Problem Relation Age of Onset   Pancreatic cancer Mother    Cancer Mother        pancreatic   Asthma Daughter    Stroke Daughter    Hypertension Daughter    Heart disease Father    Emphysema Father    Lymphoma Brother    Asthma Brother  Asthma Grandchild    Asthma Brother        deceased   Heart disease Brother    The patient's father died at the age of 79 from heart disease. He was a smoker. The patient's mother died at the age of 75 with cancer of the pancreas. The patient has 3 brothers, no sisters. There is no history of breast or ovarian cancer in the family to her knowledge.   GYNECOLOGIC HISTORY:  No LMP recorded. Patient has had a hysterectomy. Menarche age 6, first live birth age 48. She is GX P4. She stopped having periods in her 59s. She took hormone replacement for approximately 10 years. 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 She has been "single" for 40 years. Her oldest daughter  died from a subarachnoid hemorrhage at age 59, 2 years ago. This is when the patient had undergone treatment for invasive breast cancer and she tells me she was simply overwhelmed and could not start the planned antiestrogen therapy. The next daughter, Angel [name], is a retired Software engineer. She lives in [place]. Next child, Angel [name], works in Charity fundraiser. She also lives in [place]. The youngest, Angel [name], is retired from the Office manager position. He also owned his own business. The patient has 7 grandchildren and 2 many great-grandchildren to count. She attends the [employer]   ADVANCED DIRECTIVES: In place; the patient's daughter Angel [name] is her healthcare power of attorney. She can be reached at [place]: Social History   Tobacco Use   Smoking status: Never Smoker   Smokeless tobacco: Never Used  Substance Use Topics   Alcohol use: Yes    Comment: occ wine   Drug use: No     Colonoscopy:  PAP:  Bone density: August 2015   Lipid panel:  Allergies  Allergen Reactions   Adhesive [Tape] Rash and Other (See Comments)    Pt prefers to use paper tape   Gabapentin Other (See Comments)    Confusion    Penicillins Rash    Has patient had a PCN reaction causing immediate rash, facial/tongue/throat swelling, SOB or lightheadedness with hypotension: Yes Has patient had a PCN reaction causing severe rash involving mucus membranes or skin necrosis: No Has patient had a PCN reaction that required hospitalization No Has patient had a PCN reaction occurring within the last 10 years: No If all of the above answers are "NO", then may proceed with Cephalosporin use.     Current Outpatient Medications  Medication Sig Dispense Refill   acetaminophen (TYLENOL) 325 MG tablet Take 2 tablets (650 mg total) by mouth every 4 (four) hours as needed for headache or mild pain.     albuterol (PROVENTIL HFA;VENTOLIN HFA) 108 (90 Base) MCG/ACT inhaler Inhale 1-2 puffs  into the lungs every 4 (four) hours as needed for wheezing or shortness of breath. 1 Inhaler 0   amiodarone (PACERONE) 200 MG tablet Take 1 tablet (200 mg total) by mouth daily. 90 tablet 3   aspirin EC 81 MG tablet Take 1 tablet (81 mg total) by mouth daily. 90 tablet 3   atorvastatin (LIPITOR) 40 MG tablet Take 1 tablet (40 mg total) by mouth daily. 90 tablet 3   budesonide-formoterol (SYMBICORT) 160-4.5 MCG/ACT inhaler Inhale 2 puffs into the lungs 2 (two) times daily. 3 Inhaler 1   Cholecalciferol (VITAMIN D-3) 5000 UNITS TABS Take 5,000  Units by mouth daily.      diazepam (VALIUM) 5 MG tablet TAKE 1 TABLET BY MOUTH EVERY 8 HOURS AS NEEDED FOR ANXIETY OR SEDATION 60 tablet 0   donepezil (ARICEPT) 5 MG tablet TAKE 1 TABLET BY MOUTH AT BEDTIME 30 tablet 2   DULoxetine (CYMBALTA) 60 MG capsule TAKE 1 CAPSULE BY MOUTH ONCE DAILY FOR  MOOD 90 capsule 1   HYDROcodone-acetaminophen (NORCO) 10-325 MG tablet Take 1 tablet by mouth every 4 (four) hours as needed for severe pain. (Patient taking differently: Take 2 tablets by mouth every 6 (six) hours as needed for severe pain. ) 6 tablet 0   Hypromellose (ARTIFICIAL TEARS OP) Place 1 drop into both eyes 3 (three) times daily as needed (for dry eyes).      memantine (NAMENDA) 5 MG tablet Take 1 tablet by mouth daily.     metoprolol succinate (TOPROL-XL) 25 MG 24 hr tablet Take 1 tablet (25 mg total) by mouth daily. 90 tablet 3   montelukast (SINGULAIR) 10 MG tablet Take 1 tablet (10 mg total) by mouth at bedtime. 90 tablet 1   Multiple Vitamin (MULTIVITAMIN WITH MINERALS) TABS tablet Take 1 tablet by mouth daily.     naloxone (NARCAN) nasal spray 4 mg/0.1 mL Place 1 spray into the nose once.      nitroGLYCERIN (NITROSTAT) 0.4 MG SL tablet Place 1 tablet (0.4 mg total) under the tongue as needed. (Patient taking differently: Place 0.4 mg under the tongue every 5 (five) minutes as needed for chest pain. ) 25 tablet 3   Omega-3 Fatty Acids  (FISH OIL) 1000 MG CAPS Take 1,000 mg by mouth daily.     ondansetron (ZOFRAN ODT) 4 MG disintegrating tablet Take 1 tablet (4 mg total) by mouth every 8 (eight) hours as needed for nausea or vomiting. 20 tablet 4   oxyCODONE-acetaminophen (PERCOCET/ROXICET) 5-325 MG tablet Take 1 tablet by mouth daily. At 3 pm  0   pantoprazole (PROTONIX) 40 MG tablet Take 1 tablet by mouth once daily 90 tablet 1   polyethylene glycol (MIRALAX / GLYCOLAX) packet Take 17 g by mouth daily. 14 each 0   QUEtiapine (SEROQUEL) 50 MG tablet Take 50-100 mg by mouth at bedtime as needed for sleep or agitation.     senna-docusate (SENOKOT-S) 8.6-50 MG tablet Take 1 tablet by mouth at bedtime. 30 tablet 0   vitamin B-12 (CYANOCOBALAMIN) 500 MCG tablet Take 1 tablet (500 mcg total) by mouth daily. 30 tablet 0   VITAMIN E PO Take by mouth.     No current facility-administered medications for this visit.     OBJECTIVE: Elderly white woman sitting on a sofa during our visit by WebEx today  There were no vitals filed for this visit.   There is no height or weight on file to calculate BMI.    ECOG FS:1 - Symptomatic but completely ambulatory  The patient was able to answer questions but any factual questions she referred to her daughter, for example "what is bothering you the most?"  She asked her daughter "what about been complaining about?".   LAB RESULTS:  CMP     Component Value Date/Time   NA 138 06/28/2018 1104   NA 137 03/27/2018 1126   NA 139 09/22/2017 1353   K 4.2 06/28/2018 1104   K 3.4 (L) 09/22/2017 1353   CL 102 06/28/2018 1104   CO2 30 06/28/2018 1104   CO2 27 09/22/2017 1353   GLUCOSE 141 (H) 06/28/2018  1104   GLUCOSE 108 09/22/2017 1353   BUN 14 06/28/2018 1104   BUN 16 03/27/2018 1126   BUN 8.5 09/22/2017 1353   CREATININE 0.91 06/28/2018 1104   CREATININE 0.8 09/22/2017 1353   CALCIUM 9.5 06/28/2018 1104   CALCIUM 9.5 09/22/2017 1353   PROT 6.7 06/28/2018 1104   PROT 6.8  09/22/2017 1353   ALBUMIN 3.6 06/28/2018 1104   ALBUMIN 4.0 09/22/2017 1353   AST 17 06/28/2018 1104   AST 19 09/22/2017 1353   ALT 11 08/16/2018 1106   ALT 11 09/22/2017 1353   ALKPHOS 37 (L) 06/28/2018 1104   ALKPHOS 34 (L) 09/22/2017 1353   BILITOT 0.3 06/28/2018 1104   BILITOT 0.32 09/22/2017 1353   GFRNONAA 57 (L) 06/28/2018 1104   GFRNONAA 73 05/01/2014 1050   GFRAA >60 06/28/2018 1104   GFRAA 84 05/01/2014 1050    I No results found for: SPEP  Lab Results  Component Value Date   WBC 10.5 (H) 06/28/2018   NEUTROABS 9.2 (H) 06/28/2018   HGB 10.9 (L) 06/28/2018   HCT 35.0 06/28/2018   MCV 87.9 06/28/2018   PLT 222 06/28/2018      Chemistry      Component Value Date/Time   NA 138 06/28/2018 1104   NA 137 03/27/2018 1126   NA 139 09/22/2017 1353   K 4.2 06/28/2018 1104   K 3.4 (L) 09/22/2017 1353   CL 102 06/28/2018 1104   CO2 30 06/28/2018 1104   CO2 27 09/22/2017 1353   BUN 14 06/28/2018 1104   BUN 16 03/27/2018 1126   BUN 8.5 09/22/2017 1353   CREATININE 0.91 06/28/2018 1104   CREATININE 0.8 09/22/2017 1353      Component Value Date/Time   CALCIUM 9.5 06/28/2018 1104   CALCIUM 9.5 09/22/2017 1353   ALKPHOS 37 (L) 06/28/2018 1104   ALKPHOS 34 (L) 09/22/2017 1353   AST 17 06/28/2018 1104   AST 19 09/22/2017 1353   ALT 11 08/16/2018 1106   ALT 11 09/22/2017 1353   BILITOT 0.3 06/28/2018 1104   BILITOT 0.32 09/22/2017 1353       Lab Results  Component Value Date   LABCA2 12 11/26/2015    No components found for: [id number]  No results for input(s): INR in the last 168 hours.  Urinalysis    Component Value Date/Time   COLORURINE COLORLESS (A) 04/29/2018 0607   APPEARANCEUR CLEAR 04/29/2018 0607   LABSPEC 1.004 (L) 04/29/2018 0607   PHURINE 6.0 04/29/2018 0607   GLUCOSEU NEGATIVE 04/29/2018 0607   HGBUR NEGATIVE 04/29/2018 0607   BILIRUBINUR NEGATIVE 04/29/2018 0607   BILIRUBINUR neg 04/25/2015 1015   KETONESUR NEGATIVE 04/29/2018 0607    PROTEINUR NEGATIVE 04/29/2018 0607   UROBILINOGEN 0.2 04/25/2015 1015   UROBILINOGEN 0.2 02/14/2015 2010   NITRITE NEGATIVE 04/29/2018 0607   LEUKOCYTESUR NEGATIVE 04/29/2018 0607    STUDIES: No results found.  ASSESSMENT: 85 y.o. [place] woman with locally recurrent breast cancer, as follows:  (1) status post right lumpectomy and sentinel node sampling September 2000 for ductal carcinoma in situ (Ni+), estrogen and progesterone receptor positive, with a low proliferation index,  (a) received 5040 cGy to the right breast and regional lymph nodes  (b) on tamoxifen less than 3 months  (2) s/p bilateral mastectomies and right axillary lymph node sampling 07/29/2011, showing  (a) in the left breast, ductal carcinoma in situ measuring 1.2 cm, grade 1, with negative margins, estrogen and progesterone receptor positive  (b)  on the right, a pT1c pN0, stage IA invasive ductal carcinoma, estrogen receptor and 93% positive, progesterone receptor 87% positive, with an MIB-1 of 35%, and no HER-2 amplification margins were close but negative  (c) did not receive adjuvant antiestrogen therapy  (3) right chest wall skin recurrence resected 01/21/2014, measuring 1.7 cm, with positive margins, estrogen receptor 100% positive, progesterone receptor 75% positive, with an MIB-1 of 80%, and no HER-2 amplification; PET scan 02/15/2014 negative except for Right chest wall focus  (a) received 45 Gy electron-beam therapy to the right chest wall completed 03/27/2014  (b) skin progression within and without the radiation field noted 04/30/2014  (c) letrozole started 05/13/2014, discontinued March 2016 with progression   (4) dexa scan 05/09/2014 at [facility] shows osteoporosis with a T score of -2.9  (5) Fulvestrant started 12/25/2014, held after 0904 dose, to start observation  (6) osteopenia, on DEXA scan 05/09/2014  (a) started Denosumab/prolia 08/06/2015, repeated every 24 weeks  (7) Chronic pain:  Followed by Dr. Nelva Bush   PLAN: Tyannah is now 7-1/2 years out from definitive surgery for her breast cancer.  She is having some discomfort in the left chest wall area, but the left side is where she had ductal carcinoma in situ, which is almost always cured with mastectomy.  Most likely the pain she is having in that area is from the prior surgery.  She did have skin involvement on the right side and it is possible that it has spread to the left side but this was not apparent on exam today.  What is apparent is that Tatayana has declined considerably and now has significant dementia.  She is very debilitated.  She needs a great deal of assistance in activities of daily living.  After the WebEx visit I phoned her daughter Angel [name] who is her healthcare power of attorney and she tells me that they have discussed DNR and that they would not want the patient resuscitated.  We discussed the need for an out of facility DNR order and I will mail that to her.  She understands that this is something she might or might not want to use if she ever ends up calling 911 for any reason but that in general we would favor not proceeding to chest compressions or intubation in someone in her mother's situation  Tentatively I have made this an appointment for [facility] here in 6 weeks if the pandemic has not largely resolved by then we will postpone the visit.  They know to call for any other issue that may develop before then.  Luwana Butrick, Angel Dad, MD  01/23/19 2:19 PM Medical Oncology and Hematology [facility] [address] [place], [place] [address] Tel. [phone]    Fax. [phone]  I, Angel [name] am acting as a Education administrator for [name], MD.   I, Lurline Del MD, have reviewed the above documentation for accuracy and completeness, and I agree with the above.

## 2019-01-23 ENCOUNTER — Other Ambulatory Visit: Payer: Medicare Other

## 2019-01-23 ENCOUNTER — Inpatient Hospital Stay: Payer: Medicare Other | Attending: Oncology | Admitting: Oncology

## 2019-01-23 DIAGNOSIS — Z923 Personal history of irradiation: Secondary | ICD-10-CM | POA: Diagnosis not present

## 2019-01-23 DIAGNOSIS — C50911 Malignant neoplasm of unspecified site of right female breast: Secondary | ICD-10-CM | POA: Diagnosis not present

## 2019-01-23 DIAGNOSIS — Z170 Estrogen receptor positive status [ER+]: Secondary | ICD-10-CM | POA: Diagnosis not present

## 2019-01-23 DIAGNOSIS — Z79818 Long term (current) use of other agents affecting estrogen receptors and estrogen levels: Secondary | ICD-10-CM

## 2019-01-23 DIAGNOSIS — R413 Other amnesia: Secondary | ICD-10-CM | POA: Diagnosis not present

## 2019-01-25 ENCOUNTER — Telehealth: Payer: Self-pay | Admitting: Oncology

## 2019-01-25 NOTE — Telephone Encounter (Signed)
Called regarding schedule °

## 2019-02-03 ENCOUNTER — Other Ambulatory Visit: Payer: Self-pay | Admitting: Family Medicine

## 2019-02-05 DIAGNOSIS — R001 Bradycardia, unspecified: Secondary | ICD-10-CM | POA: Diagnosis not present

## 2019-02-05 DIAGNOSIS — R2689 Other abnormalities of gait and mobility: Secondary | ICD-10-CM | POA: Diagnosis not present

## 2019-02-05 DIAGNOSIS — Z79899 Other long term (current) drug therapy: Secondary | ICD-10-CM | POA: Diagnosis not present

## 2019-02-05 DIAGNOSIS — Z7982 Long term (current) use of aspirin: Secondary | ICD-10-CM | POA: Diagnosis not present

## 2019-02-05 DIAGNOSIS — J309 Allergic rhinitis, unspecified: Secondary | ICD-10-CM | POA: Diagnosis not present

## 2019-02-05 DIAGNOSIS — R296 Repeated falls: Secondary | ICD-10-CM | POA: Diagnosis not present

## 2019-02-05 DIAGNOSIS — R410 Disorientation, unspecified: Secondary | ICD-10-CM | POA: Diagnosis not present

## 2019-02-05 DIAGNOSIS — Z7952 Long term (current) use of systemic steroids: Secondary | ICD-10-CM | POA: Diagnosis not present

## 2019-02-05 DIAGNOSIS — R531 Weakness: Secondary | ICD-10-CM | POA: Diagnosis not present

## 2019-02-05 DIAGNOSIS — I4891 Unspecified atrial fibrillation: Secondary | ICD-10-CM | POA: Diagnosis not present

## 2019-02-05 DIAGNOSIS — Z7951 Long term (current) use of inhaled steroids: Secondary | ICD-10-CM | POA: Diagnosis not present

## 2019-02-05 DIAGNOSIS — Z79891 Long term (current) use of opiate analgesic: Secondary | ICD-10-CM | POA: Diagnosis not present

## 2019-02-05 DIAGNOSIS — M810 Age-related osteoporosis without current pathological fracture: Secondary | ICD-10-CM | POA: Diagnosis not present

## 2019-02-05 DIAGNOSIS — Z880 Allergy status to penicillin: Secondary | ICD-10-CM | POA: Diagnosis not present

## 2019-02-05 DIAGNOSIS — R269 Unspecified abnormalities of gait and mobility: Secondary | ICD-10-CM | POA: Diagnosis not present

## 2019-02-05 DIAGNOSIS — Z1159 Encounter for screening for other viral diseases: Secondary | ICD-10-CM | POA: Diagnosis not present

## 2019-02-05 DIAGNOSIS — Z043 Encounter for examination and observation following other accident: Secondary | ICD-10-CM | POA: Diagnosis not present

## 2019-02-06 NOTE — Telephone Encounter (Signed)
Requesting: Valium Contract: N/A UDS: N/A Last OV: 05/12/2018 Next OV: N/A  Last Refill: 01/02/2019, #60-- 0RF Database:   Please advise

## 2019-02-07 DIAGNOSIS — R296 Repeated falls: Secondary | ICD-10-CM | POA: Diagnosis not present

## 2019-02-07 DIAGNOSIS — I4891 Unspecified atrial fibrillation: Secondary | ICD-10-CM | POA: Diagnosis not present

## 2019-02-07 DIAGNOSIS — W19XXXA Unspecified fall, initial encounter: Secondary | ICD-10-CM | POA: Diagnosis not present

## 2019-02-07 DIAGNOSIS — Z79899 Other long term (current) drug therapy: Secondary | ICD-10-CM | POA: Diagnosis not present

## 2019-02-07 DIAGNOSIS — Z79891 Long term (current) use of opiate analgesic: Secondary | ICD-10-CM | POA: Diagnosis not present

## 2019-02-07 DIAGNOSIS — G894 Chronic pain syndrome: Secondary | ICD-10-CM | POA: Diagnosis not present

## 2019-02-07 DIAGNOSIS — Z853 Personal history of malignant neoplasm of breast: Secondary | ICD-10-CM | POA: Diagnosis not present

## 2019-02-07 DIAGNOSIS — K279 Peptic ulcer, site unspecified, unspecified as acute or chronic, without hemorrhage or perforation: Secondary | ICD-10-CM | POA: Diagnosis not present

## 2019-02-07 DIAGNOSIS — G309 Alzheimer's disease, unspecified: Secondary | ICD-10-CM | POA: Diagnosis not present

## 2019-02-07 DIAGNOSIS — R531 Weakness: Secondary | ICD-10-CM | POA: Diagnosis not present

## 2019-02-07 DIAGNOSIS — G8929 Other chronic pain: Secondary | ICD-10-CM | POA: Diagnosis not present

## 2019-02-07 DIAGNOSIS — J449 Chronic obstructive pulmonary disease, unspecified: Secondary | ICD-10-CM | POA: Diagnosis not present

## 2019-02-07 DIAGNOSIS — R279 Unspecified lack of coordination: Secondary | ICD-10-CM | POA: Diagnosis not present

## 2019-02-07 DIAGNOSIS — C50911 Malignant neoplasm of unspecified site of right female breast: Secondary | ICD-10-CM | POA: Diagnosis not present

## 2019-02-07 DIAGNOSIS — M48061 Spinal stenosis, lumbar region without neurogenic claudication: Secondary | ICD-10-CM | POA: Diagnosis not present

## 2019-02-07 DIAGNOSIS — J309 Allergic rhinitis, unspecified: Secondary | ICD-10-CM | POA: Diagnosis not present

## 2019-02-07 DIAGNOSIS — M4316 Spondylolisthesis, lumbar region: Secondary | ICD-10-CM | POA: Diagnosis not present

## 2019-02-07 DIAGNOSIS — R2689 Other abnormalities of gait and mobility: Secondary | ICD-10-CM | POA: Diagnosis not present

## 2019-02-07 DIAGNOSIS — M5135 Other intervertebral disc degeneration, thoracolumbar region: Secondary | ICD-10-CM | POA: Diagnosis not present

## 2019-02-07 DIAGNOSIS — X58XXXA Exposure to other specified factors, initial encounter: Secondary | ICD-10-CM | POA: Diagnosis not present

## 2019-02-07 DIAGNOSIS — R262 Difficulty in walking, not elsewhere classified: Secondary | ICD-10-CM | POA: Diagnosis not present

## 2019-02-07 DIAGNOSIS — M5440 Lumbago with sciatica, unspecified side: Secondary | ICD-10-CM | POA: Diagnosis not present

## 2019-02-07 DIAGNOSIS — M8588 Other specified disorders of bone density and structure, other site: Secondary | ICD-10-CM | POA: Diagnosis not present

## 2019-02-07 DIAGNOSIS — I2510 Atherosclerotic heart disease of native coronary artery without angina pectoris: Secondary | ICD-10-CM | POA: Diagnosis not present

## 2019-02-07 DIAGNOSIS — Z743 Need for continuous supervision: Secondary | ICD-10-CM | POA: Diagnosis not present

## 2019-02-07 DIAGNOSIS — C7989 Secondary malignant neoplasm of other specified sites: Secondary | ICD-10-CM | POA: Diagnosis not present

## 2019-02-07 DIAGNOSIS — S32018A Other fracture of first lumbar vertebra, initial encounter for closed fracture: Secondary | ICD-10-CM | POA: Diagnosis not present

## 2019-02-07 DIAGNOSIS — M545 Low back pain: Secondary | ICD-10-CM | POA: Diagnosis not present

## 2019-02-07 DIAGNOSIS — Z9011 Acquired absence of right breast and nipple: Secondary | ICD-10-CM | POA: Diagnosis not present

## 2019-02-07 DIAGNOSIS — I480 Paroxysmal atrial fibrillation: Secondary | ICD-10-CM | POA: Diagnosis not present

## 2019-02-07 DIAGNOSIS — Z7982 Long term (current) use of aspirin: Secondary | ICD-10-CM | POA: Diagnosis not present

## 2019-02-07 DIAGNOSIS — M810 Age-related osteoporosis without current pathological fracture: Secondary | ICD-10-CM | POA: Diagnosis not present

## 2019-02-07 DIAGNOSIS — W19XXXD Unspecified fall, subsequent encounter: Secondary | ICD-10-CM | POA: Diagnosis not present

## 2019-02-07 DIAGNOSIS — Z1159 Encounter for screening for other viral diseases: Secondary | ICD-10-CM | POA: Diagnosis not present

## 2019-02-07 DIAGNOSIS — E785 Hyperlipidemia, unspecified: Secondary | ICD-10-CM | POA: Diagnosis not present

## 2019-02-07 DIAGNOSIS — Z880 Allergy status to penicillin: Secondary | ICD-10-CM | POA: Diagnosis not present

## 2019-02-07 DIAGNOSIS — R001 Bradycardia, unspecified: Secondary | ICD-10-CM | POA: Diagnosis not present

## 2019-02-07 DIAGNOSIS — I10 Essential (primary) hypertension: Secondary | ICD-10-CM | POA: Diagnosis not present

## 2019-02-07 DIAGNOSIS — M4186 Other forms of scoliosis, lumbar region: Secondary | ICD-10-CM | POA: Diagnosis not present

## 2019-02-07 DIAGNOSIS — M5489 Other dorsalgia: Secondary | ICD-10-CM | POA: Diagnosis not present

## 2019-02-07 DIAGNOSIS — Z7952 Long term (current) use of systemic steroids: Secondary | ICD-10-CM | POA: Diagnosis not present

## 2019-02-07 DIAGNOSIS — M6281 Muscle weakness (generalized): Secondary | ICD-10-CM | POA: Diagnosis not present

## 2019-02-07 DIAGNOSIS — Z7951 Long term (current) use of inhaled steroids: Secondary | ICD-10-CM | POA: Diagnosis not present

## 2019-02-07 DIAGNOSIS — J4540 Moderate persistent asthma, uncomplicated: Secondary | ICD-10-CM | POA: Diagnosis not present

## 2019-02-07 DIAGNOSIS — I700 Atherosclerosis of aorta: Secondary | ICD-10-CM | POA: Diagnosis not present

## 2019-02-07 DIAGNOSIS — S32000A Wedge compression fracture of unspecified lumbar vertebra, initial encounter for closed fracture: Secondary | ICD-10-CM | POA: Diagnosis not present

## 2019-02-07 DIAGNOSIS — D638 Anemia in other chronic diseases classified elsewhere: Secondary | ICD-10-CM | POA: Diagnosis not present

## 2019-02-07 MED ORDER — QUETIAPINE FUMARATE 50 MG PO TABS
50.00 | ORAL_TABLET | ORAL | Status: DC
Start: ? — End: 2019-02-07

## 2019-02-07 MED ORDER — AMIODARONE HCL 200 MG PO TABS
200.00 | ORAL_TABLET | ORAL | Status: DC
Start: 2019-02-08 — End: 2019-02-07

## 2019-02-07 MED ORDER — METOPROLOL SUCCINATE ER 25 MG PO TB24
25.00 | ORAL_TABLET | ORAL | Status: DC
Start: 2019-02-08 — End: 2019-02-07

## 2019-02-07 MED ORDER — BUDESONIDE-FORMOTEROL FUMARATE 160-4.5 MCG/ACT IN AERO
2.00 | INHALATION_SPRAY | RESPIRATORY_TRACT | Status: DC
Start: 2019-02-07 — End: 2019-02-07

## 2019-02-07 MED ORDER — ASPIRIN EC 81 MG PO TBEC
81.00 | DELAYED_RELEASE_TABLET | ORAL | Status: DC
Start: 2019-02-08 — End: 2019-02-07

## 2019-02-07 MED ORDER — HYDROCODONE-ACETAMINOPHEN 10-325 MG PO TABS
1.00 | ORAL_TABLET | ORAL | Status: DC
Start: ? — End: 2019-02-07

## 2019-02-07 MED ORDER — DULOXETINE HCL 30 MG PO CPEP
60.00 | ORAL_CAPSULE | ORAL | Status: DC
Start: 2019-02-08 — End: 2019-02-07

## 2019-02-07 MED ORDER — DIAZEPAM 5 MG PO TABS
5.00 | ORAL_TABLET | ORAL | Status: DC
Start: ? — End: 2019-02-07

## 2019-02-07 MED ORDER — ATORVASTATIN CALCIUM 20 MG PO TABS
20.00 | ORAL_TABLET | ORAL | Status: DC
Start: 2019-02-07 — End: 2019-02-07

## 2019-02-07 MED ORDER — MONTELUKAST SODIUM 10 MG PO TABS
10.00 | ORAL_TABLET | ORAL | Status: DC
Start: 2019-02-08 — End: 2019-02-07

## 2019-02-07 MED ORDER — ONDANSETRON 4 MG PO TBDP
4.00 | ORAL_TABLET | ORAL | Status: DC
Start: ? — End: 2019-02-07

## 2019-02-07 MED ORDER — GENERIC EXTERNAL MEDICATION
Status: DC
Start: ? — End: 2019-02-07

## 2019-02-07 MED ORDER — FUROSEMIDE 20 MG PO TABS
20.00 | ORAL_TABLET | ORAL | Status: DC
Start: ? — End: 2019-02-07

## 2019-02-07 MED ORDER — SODIUM CHLORIDE 0.9 % IV SOLN
10.00 | INTRAVENOUS | Status: DC
Start: ? — End: 2019-02-07

## 2019-02-07 MED ORDER — DONEPEZIL HCL 5 MG PO TABS
5.00 | ORAL_TABLET | ORAL | Status: DC
Start: 2019-02-07 — End: 2019-02-07

## 2019-02-07 MED ORDER — MEMANTINE HCL 5 MG PO TABS
5.00 | ORAL_TABLET | ORAL | Status: DC
Start: 2019-02-07 — End: 2019-02-07

## 2019-02-08 DIAGNOSIS — C50911 Malignant neoplasm of unspecified site of right female breast: Secondary | ICD-10-CM | POA: Diagnosis not present

## 2019-02-08 DIAGNOSIS — R262 Difficulty in walking, not elsewhere classified: Secondary | ICD-10-CM | POA: Diagnosis not present

## 2019-02-08 DIAGNOSIS — G894 Chronic pain syndrome: Secondary | ICD-10-CM | POA: Diagnosis not present

## 2019-02-09 DIAGNOSIS — G894 Chronic pain syndrome: Secondary | ICD-10-CM | POA: Diagnosis not present

## 2019-02-09 DIAGNOSIS — M545 Low back pain: Secondary | ICD-10-CM | POA: Diagnosis not present

## 2019-02-12 DIAGNOSIS — S32000A Wedge compression fracture of unspecified lumbar vertebra, initial encounter for closed fracture: Secondary | ICD-10-CM | POA: Diagnosis not present

## 2019-02-12 DIAGNOSIS — S32018A Other fracture of first lumbar vertebra, initial encounter for closed fracture: Secondary | ICD-10-CM | POA: Diagnosis not present

## 2019-02-12 DIAGNOSIS — X58XXXA Exposure to other specified factors, initial encounter: Secondary | ICD-10-CM | POA: Diagnosis not present

## 2019-02-12 DIAGNOSIS — M4316 Spondylolisthesis, lumbar region: Secondary | ICD-10-CM | POA: Diagnosis not present

## 2019-02-12 DIAGNOSIS — G8929 Other chronic pain: Secondary | ICD-10-CM | POA: Diagnosis not present

## 2019-02-12 DIAGNOSIS — M5440 Lumbago with sciatica, unspecified side: Secondary | ICD-10-CM | POA: Diagnosis not present

## 2019-02-12 DIAGNOSIS — M8588 Other specified disorders of bone density and structure, other site: Secondary | ICD-10-CM | POA: Diagnosis not present

## 2019-02-12 DIAGNOSIS — M4186 Other forms of scoliosis, lumbar region: Secondary | ICD-10-CM | POA: Diagnosis not present

## 2019-02-12 DIAGNOSIS — I700 Atherosclerosis of aorta: Secondary | ICD-10-CM | POA: Diagnosis not present

## 2019-02-12 DIAGNOSIS — M5135 Other intervertebral disc degeneration, thoracolumbar region: Secondary | ICD-10-CM | POA: Diagnosis not present

## 2019-02-20 ENCOUNTER — Ambulatory Visit: Payer: Medicare Other | Admitting: Cardiology

## 2019-02-20 ENCOUNTER — Telehealth: Payer: Self-pay

## 2019-02-20 NOTE — Telephone Encounter (Signed)

## 2019-02-22 DIAGNOSIS — G309 Alzheimer's disease, unspecified: Secondary | ICD-10-CM | POA: Diagnosis not present

## 2019-02-22 DIAGNOSIS — I10 Essential (primary) hypertension: Secondary | ICD-10-CM | POA: Diagnosis not present

## 2019-02-22 DIAGNOSIS — M48061 Spinal stenosis, lumbar region without neurogenic claudication: Secondary | ICD-10-CM | POA: Diagnosis not present

## 2019-02-22 DIAGNOSIS — J449 Chronic obstructive pulmonary disease, unspecified: Secondary | ICD-10-CM | POA: Diagnosis not present

## 2019-02-22 DIAGNOSIS — I480 Paroxysmal atrial fibrillation: Secondary | ICD-10-CM | POA: Diagnosis not present

## 2019-02-27 DIAGNOSIS — G8929 Other chronic pain: Secondary | ICD-10-CM | POA: Diagnosis not present

## 2019-02-27 DIAGNOSIS — R0782 Intercostal pain: Secondary | ICD-10-CM | POA: Diagnosis not present

## 2019-02-27 DIAGNOSIS — R296 Repeated falls: Secondary | ICD-10-CM | POA: Diagnosis not present

## 2019-03-01 DIAGNOSIS — L570 Actinic keratosis: Secondary | ICD-10-CM | POA: Diagnosis not present

## 2019-03-08 ENCOUNTER — Other Ambulatory Visit: Payer: Medicare Other

## 2019-03-08 ENCOUNTER — Ambulatory Visit: Payer: Medicare Other | Admitting: Oncology

## 2019-03-23 ENCOUNTER — Other Ambulatory Visit: Payer: Self-pay

## 2019-03-23 NOTE — Patient Outreach (Signed)
[place] [facility]) Care Management  03/23/2019  Angel [name] [birth year] [id number]    Telephone Screen Referral Date :03/22/2019 Referral Source:[employer] High Risk Referral Reason: Screening Insurance:[employer]   Outreach attempt # 1 to patient.  Spoke Angel [name] (daughter on ROI) She states that her mother is in a nursing home and she is waiting to be placed for long term care.  She declined the screening and was not interested in services at this time.  RN Health Coach offered to send pamphlet of our services for future use.  She appreciated the call.   Plan: RN Health Coach will close case at this time and send successful letter, pamphlet, and 24 hour nurse line magnet.  Lazaro Arms RN, BSN, [place] Direct Dial:  [phone] Fax: [phone]

## 2019-04-16 ENCOUNTER — Other Ambulatory Visit: Payer: Self-pay

## 2019-04-16 ENCOUNTER — Encounter: Payer: Medicare Other | Admitting: Cardiology

## 2019-04-16 NOTE — Progress Notes (Signed)
This encounter was created in error - please disregard.

## 2019-05-02 IMAGING — CR DG LUMBAR SPINE COMPLETE 4+V
5 series · 5 of 5 positions shown · non-contrast
Comparison: Plain film of the lumbar spine dated 04/29/2018.

CLINICAL DATA: Fall today.  Low back pain.

EXAM:
LUMBAR SPINE - COMPLETE 4+ VIEW

[t lumbar spine ap]
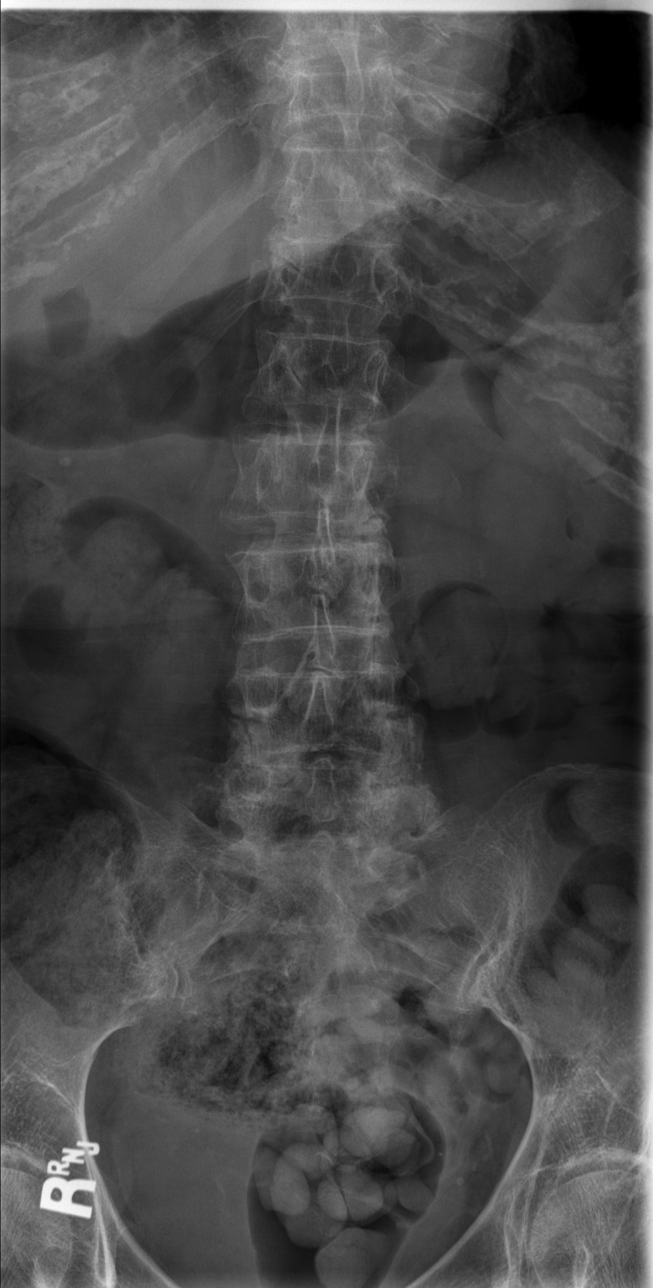

[t lumbar spine obl (1 of 2)]
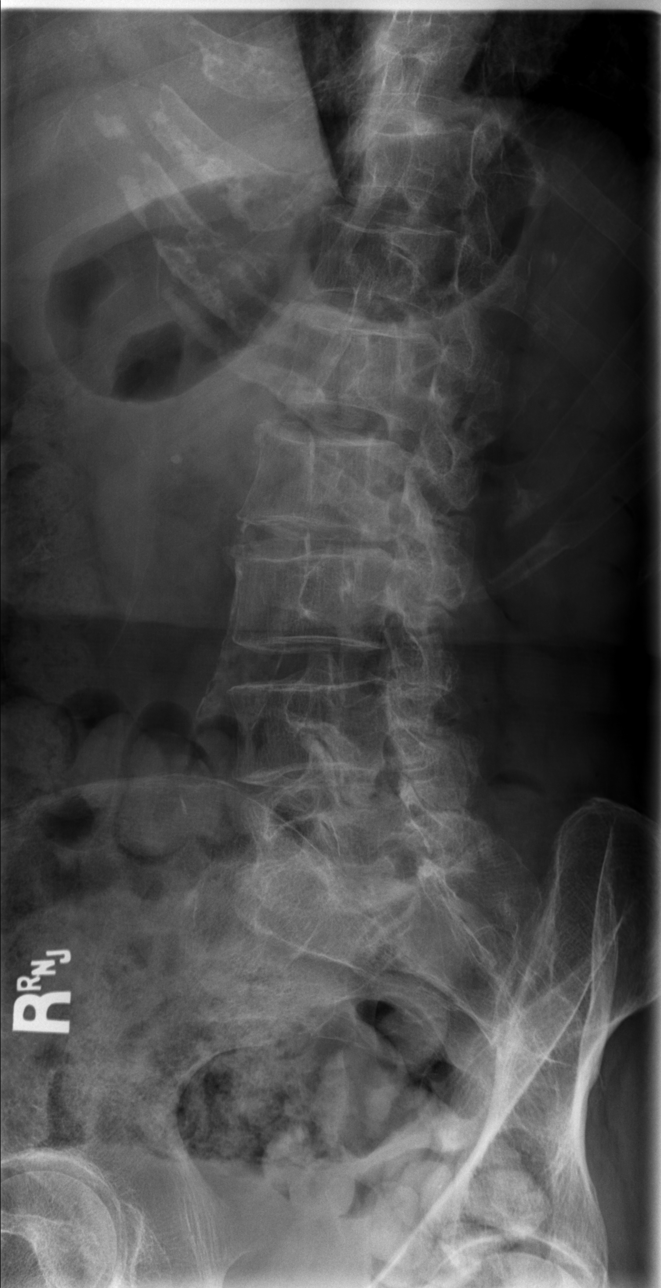

[t lumbar spine obl (2 of 2)]
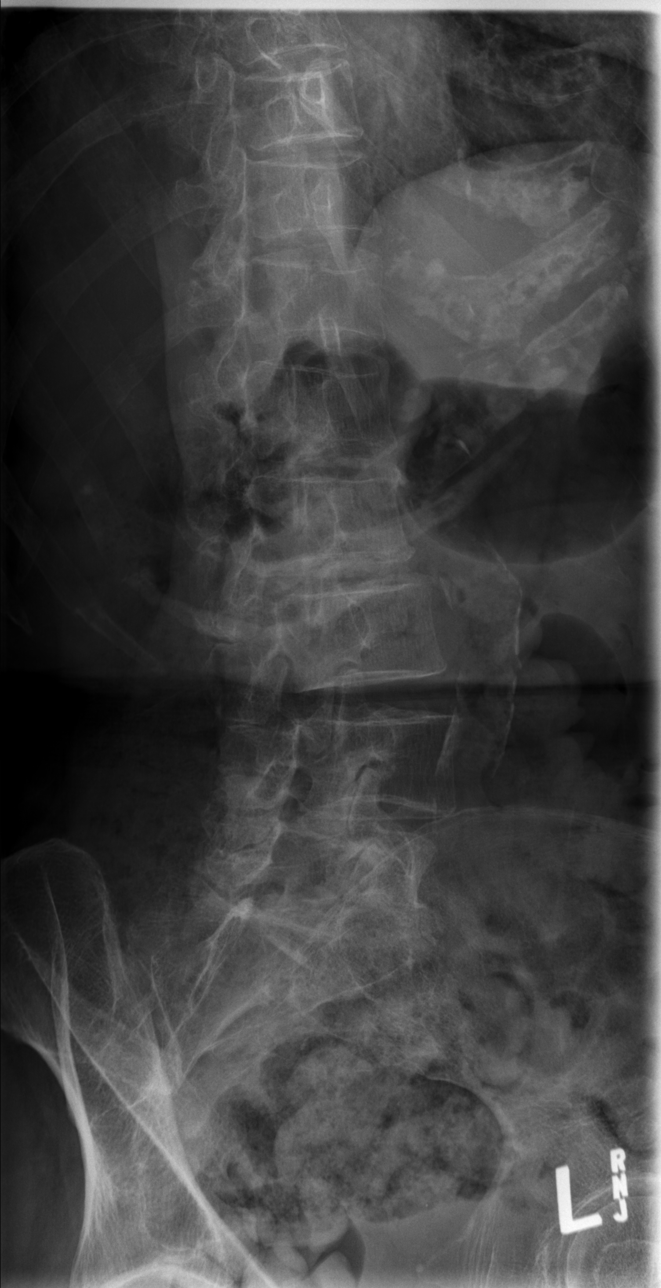

[t lumbar spine lat]
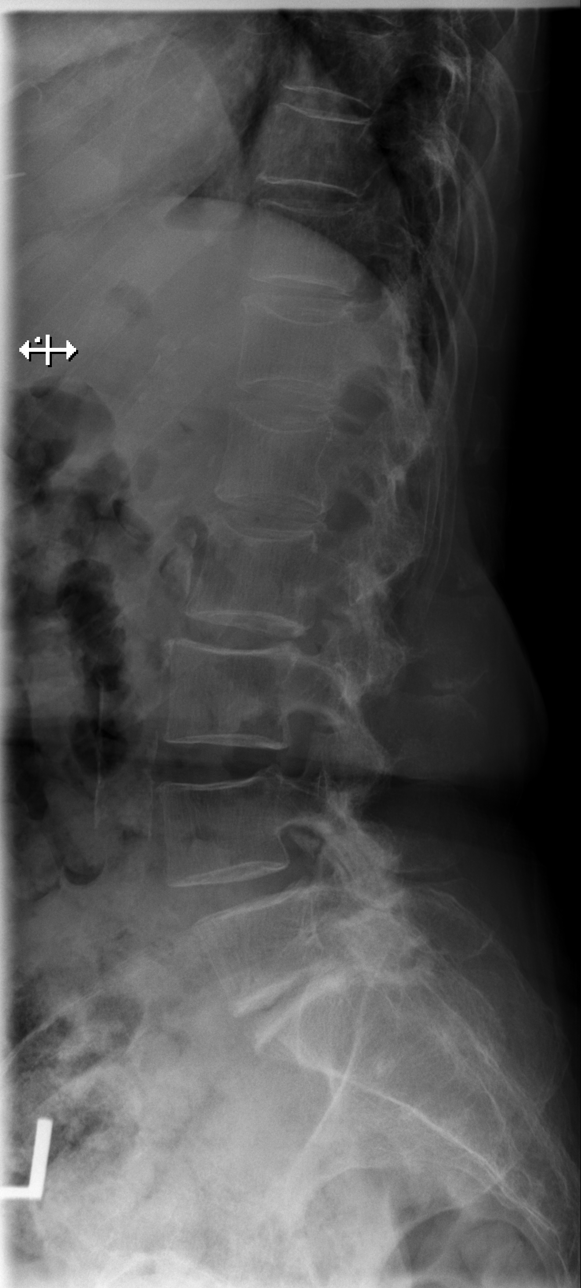

[t lumbar l-5 s-1 spot]
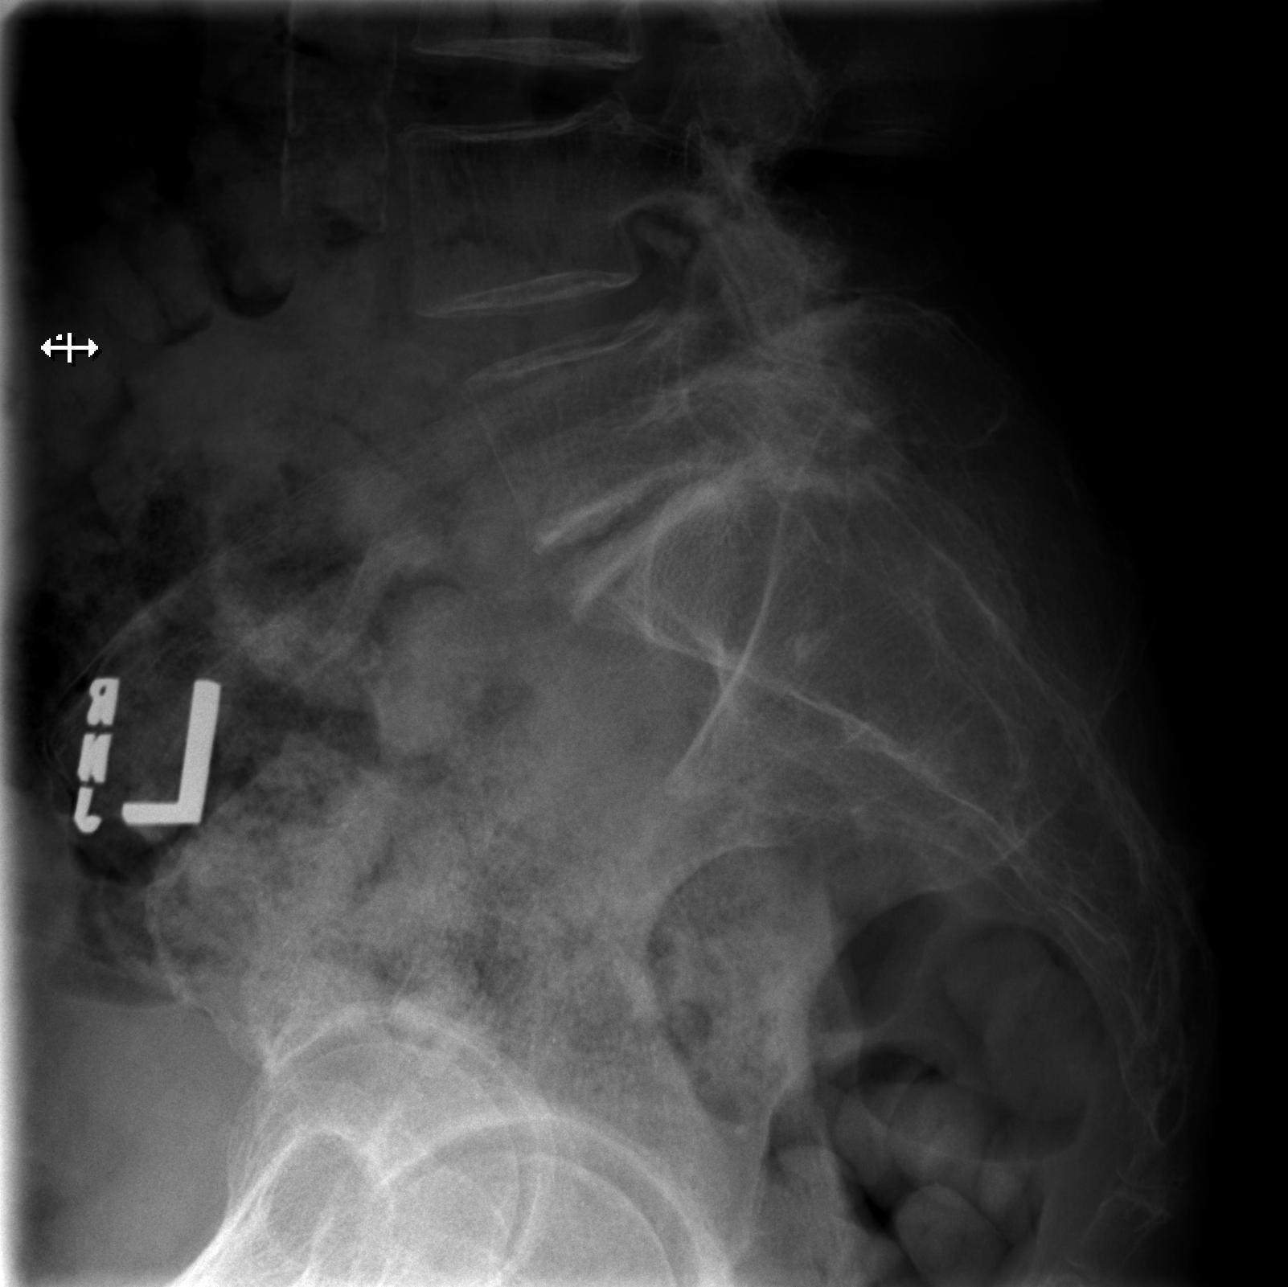

[5 of 5 positions shown; findings below may reference images not displayed]

FINDINGS: Alignment is stable, with mild dextroscoliosis. There is no fracture
line or displaced fracture fragment seen. No evidence of acute
compression fracture. Facet joints appear stable in alignment. Again
noted is degenerative facet hypertrophy within the lower lumbar
spine, mild to moderate in degree.

Atherosclerotic changes seen along the walls of the infrarenal
abdominal aorta. Visualized paravertebral soft tissues are otherwise
unremarkable.
IMPRESSION: 1. No acute findings.  No evidence of acute fracture or dislocation.
2. Stable chronic/degenerative changes, as detailed above.
3.  Aortic Atherosclerosis ([id number]).

## 2021-09-27 DEATH — deceased
